# Patient Record
Sex: Female | Born: 1940 | Race: White | Hispanic: No | Marital: Single | Smoking: Never smoker
Health system: Southern US, Community
[De-identification: ages and names within clinical notes are randomized; demographics above are authoritative.]

## PROBLEM LIST (undated history)

## (undated) DIAGNOSIS — I10 Essential (primary) hypertension: Secondary | ICD-10-CM

## (undated) DIAGNOSIS — I509 Heart failure, unspecified: Secondary | ICD-10-CM

## (undated) DIAGNOSIS — F419 Anxiety disorder, unspecified: Secondary | ICD-10-CM

## (undated) DIAGNOSIS — R9430 Abnormal result of cardiovascular function study, unspecified: Secondary | ICD-10-CM

## (undated) DIAGNOSIS — E785 Hyperlipidemia, unspecified: Secondary | ICD-10-CM

## (undated) DIAGNOSIS — J189 Pneumonia, unspecified organism: Secondary | ICD-10-CM

## (undated) DIAGNOSIS — K219 Gastro-esophageal reflux disease without esophagitis: Secondary | ICD-10-CM

## (undated) DIAGNOSIS — J849 Interstitial pulmonary disease, unspecified: Secondary | ICD-10-CM

## (undated) DIAGNOSIS — K589 Irritable bowel syndrome without diarrhea: Secondary | ICD-10-CM

## (undated) DIAGNOSIS — E119 Type 2 diabetes mellitus without complications: Secondary | ICD-10-CM

## (undated) DIAGNOSIS — I639 Cerebral infarction, unspecified: Secondary | ICD-10-CM

## (undated) DIAGNOSIS — C719 Malignant neoplasm of brain, unspecified: Secondary | ICD-10-CM

## (undated) DIAGNOSIS — C50411 Malignant neoplasm of upper-outer quadrant of right female breast: Secondary | ICD-10-CM

## (undated) DIAGNOSIS — M1990 Unspecified osteoarthritis, unspecified site: Secondary | ICD-10-CM

## (undated) DIAGNOSIS — D333 Benign neoplasm of cranial nerves: Secondary | ICD-10-CM

## (undated) DIAGNOSIS — H9190 Unspecified hearing loss, unspecified ear: Secondary | ICD-10-CM

## (undated) DIAGNOSIS — IMO0002 Reserved for concepts with insufficient information to code with codable children: Secondary | ICD-10-CM

## (undated) DIAGNOSIS — K5790 Diverticulosis of intestine, part unspecified, without perforation or abscess without bleeding: Secondary | ICD-10-CM

## (undated) DIAGNOSIS — G250 Essential tremor: Secondary | ICD-10-CM

## (undated) HISTORY — PX: BIOPSY BREAST: PRO8

## (undated) HISTORY — DX: Type 2 diabetes mellitus without complications: E11.9

## (undated) HISTORY — DX: Unspecified osteoarthritis, unspecified site: M19.90

## (undated) HISTORY — DX: Gastro-esophageal reflux disease without esophagitis: K21.9

## (undated) HISTORY — DX: Reserved for concepts with insufficient information to code with codable children: IMO0002

## (undated) HISTORY — DX: Benign neoplasm of cranial nerves: D33.3

## (undated) HISTORY — DX: Malignant neoplasm of upper-outer quadrant of right female breast: C50.411

## (undated) HISTORY — DX: Abnormal result of cardiovascular function study, unspecified: R94.30

## (undated) HISTORY — DX: Hyperlipidemia, unspecified: E78.5

## (undated) HISTORY — DX: Unspecified hearing loss, unspecified ear: H91.90

## (undated) HISTORY — DX: Irritable bowel syndrome, unspecified: K58.9

## (undated) HISTORY — DX: Malignant neoplasm of brain, unspecified: C71.9

## (undated) HISTORY — DX: Diverticulosis of intestine, part unspecified, without perforation or abscess without bleeding: K57.90

## (undated) HISTORY — DX: Essential (primary) hypertension: I10

---
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
## 1978-05-27 HISTORY — PX: ABDOMINAL HYSTERECTOMY: SHX81

## 2004-03-27 ENCOUNTER — Ambulatory Visit: Payer: Self-pay | Admitting: Pain Medicine

## 2004-04-09 ENCOUNTER — Ambulatory Visit: Payer: Self-pay | Admitting: Pain Medicine

## 2004-05-17 ENCOUNTER — Ambulatory Visit: Payer: Self-pay | Admitting: Pain Medicine

## 2004-06-06 ENCOUNTER — Ambulatory Visit: Payer: Self-pay | Admitting: Pain Medicine

## 2004-07-03 ENCOUNTER — Ambulatory Visit: Payer: Self-pay | Admitting: Unknown Physician Specialty

## 2004-07-12 ENCOUNTER — Ambulatory Visit: Payer: Self-pay | Admitting: Pain Medicine

## 2004-08-20 ENCOUNTER — Ambulatory Visit: Payer: Self-pay | Admitting: Pain Medicine

## 2005-03-07 ENCOUNTER — Ambulatory Visit: Payer: Self-pay | Admitting: Family Medicine

## 2005-05-27 HISTORY — PX: OTHER SURGICAL HISTORY: SHX169

## 2005-07-10 ENCOUNTER — Ambulatory Visit: Payer: Self-pay | Admitting: Unknown Physician Specialty

## 2006-03-04 ENCOUNTER — Ambulatory Visit: Payer: Self-pay | Admitting: Ophthalmology

## 2006-03-11 ENCOUNTER — Ambulatory Visit: Payer: Self-pay | Admitting: Ophthalmology

## 2006-03-18 ENCOUNTER — Ambulatory Visit: Payer: Self-pay | Admitting: Family Medicine

## 2006-05-06 ENCOUNTER — Ambulatory Visit: Payer: Self-pay | Admitting: Ophthalmology

## 2006-05-13 ENCOUNTER — Ambulatory Visit: Payer: Self-pay | Admitting: Ophthalmology

## 2006-07-28 ENCOUNTER — Ambulatory Visit: Payer: Self-pay | Admitting: Unknown Physician Specialty

## 2006-09-19 ENCOUNTER — Ambulatory Visit: Payer: Self-pay

## 2007-03-23 ENCOUNTER — Ambulatory Visit: Payer: Self-pay | Admitting: Family Medicine

## 2007-07-31 ENCOUNTER — Ambulatory Visit: Payer: Self-pay | Admitting: Unknown Physician Specialty

## 2008-03-29 ENCOUNTER — Ambulatory Visit: Payer: Self-pay | Admitting: Family Medicine

## 2008-04-05 ENCOUNTER — Ambulatory Visit (HOSPITAL_COMMUNITY): Admission: RE | Admit: 2008-04-05 | Discharge: 2008-04-05 | Payer: Self-pay | Admitting: Unknown Physician Specialty

## 2008-08-03 ENCOUNTER — Ambulatory Visit: Payer: Self-pay | Admitting: Unknown Physician Specialty

## 2009-05-04 ENCOUNTER — Ambulatory Visit: Payer: Self-pay | Admitting: Family Medicine

## 2009-05-25 ENCOUNTER — Encounter: Admission: RE | Admit: 2009-05-25 | Discharge: 2009-05-25 | Payer: Self-pay | Admitting: Family Medicine

## 2009-05-25 ENCOUNTER — Encounter: Payer: Self-pay | Admitting: Family Medicine

## 2009-05-27 HISTORY — PX: CHOLECYSTECTOMY: SHX55

## 2009-05-27 LAB — HM MAMMOGRAPHY: HM Mammogram: NORMAL

## 2009-06-19 ENCOUNTER — Encounter: Admission: RE | Admit: 2009-06-19 | Discharge: 2009-07-12 | Payer: Self-pay | Admitting: Neurosurgery

## 2009-08-09 ENCOUNTER — Ambulatory Visit: Payer: Self-pay | Admitting: Unknown Physician Specialty

## 2009-10-05 ENCOUNTER — Encounter: Admission: RE | Admit: 2009-10-05 | Discharge: 2009-10-05 | Payer: Self-pay | Admitting: Gastroenterology

## 2009-11-08 ENCOUNTER — Encounter (INDEPENDENT_AMBULATORY_CARE_PROVIDER_SITE_OTHER): Payer: Self-pay | Admitting: General Surgery

## 2009-11-09 ENCOUNTER — Inpatient Hospital Stay (HOSPITAL_COMMUNITY): Admission: RE | Admit: 2009-11-09 | Discharge: 2009-11-10 | Payer: Self-pay | Admitting: General Surgery

## 2010-02-21 ENCOUNTER — Encounter: Payer: Self-pay | Admitting: Family Medicine

## 2010-02-27 ENCOUNTER — Encounter
Admission: RE | Admit: 2010-02-27 | Discharge: 2010-03-29 | Payer: Self-pay | Source: Home / Self Care | Admitting: Family Medicine

## 2010-04-03 ENCOUNTER — Ambulatory Visit: Payer: Self-pay | Admitting: Family Medicine

## 2010-04-03 DIAGNOSIS — E785 Hyperlipidemia, unspecified: Secondary | ICD-10-CM | POA: Insufficient documentation

## 2010-04-03 DIAGNOSIS — K219 Gastro-esophageal reflux disease without esophagitis: Secondary | ICD-10-CM | POA: Insufficient documentation

## 2010-04-03 DIAGNOSIS — D333 Benign neoplasm of cranial nerves: Secondary | ICD-10-CM | POA: Insufficient documentation

## 2010-04-03 DIAGNOSIS — Z8719 Personal history of other diseases of the digestive system: Secondary | ICD-10-CM | POA: Insufficient documentation

## 2010-04-03 DIAGNOSIS — I10 Essential (primary) hypertension: Secondary | ICD-10-CM | POA: Insufficient documentation

## 2010-04-03 DIAGNOSIS — K589 Irritable bowel syndrome without diarrhea: Secondary | ICD-10-CM | POA: Insufficient documentation

## 2010-04-03 DIAGNOSIS — Z8672 Personal history of thrombophlebitis: Secondary | ICD-10-CM | POA: Insufficient documentation

## 2010-05-25 ENCOUNTER — Encounter: Payer: Self-pay | Admitting: Family Medicine

## 2010-06-17 ENCOUNTER — Encounter: Payer: Self-pay | Admitting: General Surgery

## 2010-06-27 NOTE — Letter (Signed)
Summary: Records from [facility]  Records from [facility]   Imported By: Maryln Gottron 04/11/2010 14:24:05  _____________________________________________________________________  External Attachment:    Type:   Image     Comment:   External Document

## 2010-06-27 NOTE — Assessment & Plan Note (Signed)
Summary: NEW TO EST/CCM   Vital Signs:  Patient profile:   70 year old female Menstrual status:  postmenopausal LMP:     03/28/1979 Height:      60.50 inches Weight:      154 pounds BMI:     29.69 Temp:     97.6 degrees F oral Pulse rate:   72 / minute Pulse rhythm:   regular Resp:     12 per minute BP sitting:   142 / 82  (left arm) Cuff size:   regular  Vitals Entered By: Sid Falcon LPN (April 03, 2010 9:05 AM)  Nutrition Counseling: Patient's BMI is greater than 25 and therefore counseled on weight management options. LMP (date): 03/28/1979     Menstrual Status postmenopausal Enter LMP: 03/28/1979 Last PAP Result normal   History of Present Illness: New patient to establish care.  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  Compliant with all meds.  No reported side effects.  Patient relates cholecystectomy in June of this year. Prior breast biopsy. Tonsillectomy in childhood. Partial hysterectomy 1980 secondary to menorrhagia.  Medications reviewed. Allergy to sulfa.  Family history significant for father coronary artery disease in his 21s and also prior history of stroke and hyperlipidemia. Mother had hypertensive cardiac disease in her 79s  Patient is widowed since 2007. Nonsmoker. Occasional alcohol use.  Flu vaccine already received. Last tetanus unknown. Old records pending. She's had previous Pneumovax. Colonoscopy 2009.  Preventive Screening-Counseling & Management  Alcohol-Tobacco     Smoking Status: never  Caffeine-Diet-Exercise     Does Patient Exercise: no  Allergies (verified): 1)  Sulfa 2)  Adhesive Tape  Past History:  Family History: Last updated: 04/03/2010 Family History of  Arthritis Father, elevated cholesterol, heart disease, stroke Mother, elevated cholesterol, Htn  Social History: Last updated: 04/03/2010 Occupation: Never Smoked Alcohol use-yes Regular exercise-no Widow/Widower since 2007  Risk Factors: Exercise: no (04/03/2010)  Risk Factors: Smoking Status: never (04/03/2010)  Past Medical History: Arthritis, degenerative Asthma Diverticulosis Hyperlipidemia Hypertension DVT hx following injury Essential Tremor GERD IBS Acoustic Neuroma, left  Past Surgical History: Cholecystectomy 2011 Hysterectomy 1980 Partial sec to menorrhagia Tonsillectomy 1945 Breast biopsy L Acoustic Neuroma 1991 eyes 2007 PMH-FH-SH reviewed for relevance  Family History: Family History of Arthritis Father, elevated cholesterol, heart disease, stroke Mother, elevated cholesterol, Htn  Social History: Occupation: Never Smoked Alcohol use-yes Regular exercise-no Widow/Widower since 2007 Smoking Status:  never Occupation:  employed Does Patient Exercise:  no  Review of Systems  The patient denies anorexia, fever, weight loss, weight gain, vision loss, decreased hearing, hoarseness, chest pain, syncope, dyspnea on exertion, peripheral edema, prolonged cough, headaches, hemoptysis, abdominal pain, melena, hematochezia, severe indigestion/heartburn, hematuria, incontinence, genital sores, muscle weakness, suspicious skin lesions, transient blindness, difficulty walking, depression, unusual weight change, abnormal bleeding, enlarged lymph nodes, and breast masses.    Physical Exam  General:  Well-developed,well-nourished,in no acute distress; alert,appropriate and cooperative throughout examination Head:  Normocephalic and atraumatic without obvious abnormalities. No apparent alopecia or balding. Eyes:  pupils equal, pupils round, and pupils reactive to light.   Ears:  External ear exam shows no significant lesions or deformities.  Otoscopic  examination reveals clear canals, tympanic membranes are intact bilaterally without bulging, retraction, inflammation or discharge. Hearing is grossly normal bilaterally. Mouth:  Oral  mucosa and oropharynx without lesions or exudates.  Teeth in good repair. Neck:  No deformities, masses, or tenderness noted. Lungs:  Normal respiratory effort, chest expands symmetrically. Lungs are clear to auscultation, no crackles or wheezes. Heart:  normal rate and regular rhythm.   Abdomen:  soft and non-tender.   Extremities:  No clubbing, cyanosis, edema, or deformity noted with normal full range of motion of all joints.   Neurologic:  alert & oriented X3 and cranial nerves II-XII intact.   Cervical Nodes:  No lymphadenopathy noted Psych:  normally interactive, good eye contact, not anxious appearing, and not depressed appearing.     Impression & Recommendations:  Problem # 1:  HYPERTENSION (ICD-401.9)  Her updated medication list for this problem includes:    Lisinopril 20 Mg Tabs (Lisinopril) ..... One by mouth two times a day    Propranolol Hcl 40 Mg Tabs (Propranolol hcl) ..... One by mouth two times a day  Problem # 2:  HYPERLIPIDEMIA (ICD-272.4)  Problem # 3:  TREMOR, ESSENTIAL (ICD-333.1)  Problem # 4:  DEEP VENOUS THROMBOPHLEBITIS, HX OF (ICD-V12.52)  Her updated medication list for this problem includes:    Aspirin 81 Mg Tabs (Aspirin) ..... Once daily  Problem # 5:  GERD (ICD-530.81)  Her updated medication list for this problem includes:    Omeprazole 20 Mg Cpdr (Omeprazole) ..... Once daily    Clidinium-chlordiazepoxide 2.5-5 Mg Caps (Clidinium-chlordiazepoxide) ..... One by mouth two times a day as needed  Problem # 6:  ACOUSTIC NEUROMA (ICD-225.1) followed by ENT.  Problem # 7:  IBS (ICD-564.1)  Complete Medication List: 1)  Lisinopril 20 Mg Tabs (Lisinopril) .... One by mouth two times a day 2)  Aspirin 81 Mg Tabs (Aspirin) .... Once daily 3)  Omeprazole 20 Mg Cpdr  (Omeprazole) .... Once daily 4)  Propranolol Hcl 40 Mg Tabs (Propranolol hcl) .... One by mouth two times a day 5)  Clidinium-chlordiazepoxide 2.5-5 Mg Caps (Clidinium-chlordiazepoxide) .... One by mouth two times a day as needed  Patient Instructions: 1)  Please schedule a follow-up appointment in 6 months .  2)  Schedule CPE at follow up.   Orders Added: 1)  New Patient Level IV [11914]    Preventive Care Screening  Last Flu Shot:    Date:  03/27/2010    Results:  given   Pap Smear:    Date:  05/27/2009    Results:  normal   Mammogram:    Date:  05/27/2008    Results:  normal   Colonoscopy:    Date:  03/27/2008    Results:  abnormal

## 2010-07-02 ENCOUNTER — Telehealth: Payer: Self-pay | Admitting: Family Medicine

## 2010-07-02 ENCOUNTER — Encounter: Payer: Self-pay | Admitting: Family Medicine

## 2010-07-02 ENCOUNTER — Ambulatory Visit (INDEPENDENT_AMBULATORY_CARE_PROVIDER_SITE_OTHER): Payer: Medicare Other | Admitting: Family Medicine

## 2010-07-02 VITALS — BP 150/84 | Temp 97.8°F | Ht 61.0 in | Wt 153.0 lb

## 2010-07-02 DIAGNOSIS — J0110 Acute frontal sinusitis, unspecified: Secondary | ICD-10-CM

## 2010-07-02 MED ORDER — AZITHROMYCIN 250 MG PO TABS: ORAL_TABLET | ORAL | Status: AC

## 2010-07-02 NOTE — Telephone Encounter (Signed)
Pt informed Rx was called in, printed by mistake, error with Epic

## 2010-07-02 NOTE — Progress Notes (Signed)
  Subjective:    Patient ID: Carmen Duncan, female    DOB: 11-Mar-1941, 70 y.o.   MRN: [id number]  HPI Patient seen with onset of illness about one week ago. Postnasal drainage, nasal congestion, sore throat, cough which has recently become productive and frontal sinus pressure- Especially over right frontal sinus region. Denies any fever or chills. Intermittent laryngitis symptoms. Mild headaches. Tylenol and antihistamine without relief.  No teeth pain.  Very little relief with OTC analgesics. Has also taken some Mucinex.   Review of Systems    Denies chronic cough, fever, dyspnea, rash, nausea, or vomiting. Objective:   Physical Exam    patient alert in no distress Thick yellow mucous right naris nasal mucosa erythematous bilaterally Tender over frontal sinuses bilaterally. Neck supple no adenopathy Oropharynx clear with moist mucosa  Chest clear to auscultation Heart regular rhythm and rate Skin no rash     Assessment & Plan: Acute sinusitis.  acute sinusitis. Acute sinusitis  Acute sinusitis.  Start Zithromax and plenty of fluids and f/u prn.

## 2010-07-02 NOTE — Telephone Encounter (Signed)
Pt went to [facility] on [address] to pick up zpak and nothing has been called in. Pt is waiting at the pharmacy. Pls call in to pharmacy asap ph [phone]  Fax [phone]

## 2010-07-25 ENCOUNTER — Other Ambulatory Visit: Payer: Self-pay | Admitting: Family Medicine

## 2010-07-25 MED ORDER — CILIDINIUM-CHLORDIAZEPOXIDE 2.5-5 MG PO CAPS: 2.0000 | ORAL_CAPSULE | Freq: Two times a day (BID) | ORAL | Status: DC

## 2010-07-25 NOTE — Telephone Encounter (Signed)
Rx called into pharmacy and sent electronically

## 2010-07-25 NOTE — Telephone Encounter (Signed)
[facility] phar call waiting on response from doc

## 2010-07-25 NOTE — Telephone Encounter (Signed)
Pt called and said that [facility] on [facility], has sent numerous req over to get refill of pts generic Librax 2.5 mg. Pt is leaving to go out of town today and will not be back until Saturday. Pt is at pharmacy. Pls call in to [facility] Phone # [phone]  Or fax # [phone]

## 2010-07-31 ENCOUNTER — Other Ambulatory Visit: Payer: Self-pay | Admitting: *Deleted

## 2010-07-31 DIAGNOSIS — I10 Essential (primary) hypertension: Secondary | ICD-10-CM

## 2010-07-31 MED ORDER — LISINOPRIL 20 MG PO TABS: 20.0000 mg | ORAL_TABLET | Freq: Two times a day (BID) | ORAL | Status: DC

## 2010-08-12 LAB — COMPREHENSIVE METABOLIC PANEL
AST: 52 U/L — ABNORMAL HIGH (ref 0–37)
BUN: 10 mg/dL (ref 6–23)
Creatinine, Ser: 0.72 mg/dL (ref 0.4–1.2)
GFR calc non Af Amer: >60 mL/min (ref >60)
Sodium: 140 mEq/L (ref 135–145)
Total Bilirubin: 1 mg/dL (ref 0.3–1.2)
Total Protein: 6.2 g/dL (ref 6.0–8.3)

## 2010-08-12 LAB — CBC
HCT: 39 % (ref 36.0–46.0)
Hemoglobin: 13.5 g/dL (ref 12.0–15.0)
MCHC: 34.7 g/dL (ref 30.0–36.0)
MCV: 93.4 fL (ref 78.0–100.0)
RDW: 13.7 % (ref 11.5–15.5)
WBC: 9.2 10*3/uL (ref 4.0–10.5)

## 2010-08-13 LAB — COMPREHENSIVE METABOLIC PANEL
ALT: 25 U/L (ref 0–35)
AST: 30 U/L (ref 0–37)
Albumin: 3.5 g/dL (ref 3.5–5.2)
BUN: 12 mg/dL (ref 6–23)
CO2: 29 mEq/L (ref 19–32)
Calcium: 9.4 mg/dL (ref 8.4–10.5)
Creatinine, Ser: 0.76 mg/dL (ref 0.4–1.2)
GFR calc non Af Amer: >60 mL/min (ref >60)
Glucose, Bld: 103 mg/dL — ABNORMAL HIGH (ref 70–99)
Sodium: 142 mEq/L (ref 135–145)
Total Protein: 6.3 g/dL (ref 6.0–8.3)

## 2010-08-13 LAB — DIFFERENTIAL
Basophils Absolute: 0 10*3/uL (ref 0.0–0.1)
Lymphocytes Relative: 34 % (ref 12–46)
Monocytes Absolute: 0.6 10*3/uL (ref 0.1–1.0)
Neutrophils Relative %: 52 % (ref 43–77)

## 2010-08-13 LAB — CBC
Platelets: 256 10*3/uL (ref 150–400)
RDW: 13.3 % (ref 11.5–15.5)
WBC: 5.4 10*3/uL (ref 4.0–10.5)

## 2010-10-02 ENCOUNTER — Encounter: Payer: Self-pay | Admitting: Family Medicine

## 2010-10-02 ENCOUNTER — Ambulatory Visit (INDEPENDENT_AMBULATORY_CARE_PROVIDER_SITE_OTHER): Payer: Medicare Other | Admitting: Family Medicine

## 2010-10-02 DIAGNOSIS — R109 Unspecified abdominal pain: Secondary | ICD-10-CM

## 2010-10-02 DIAGNOSIS — I10 Essential (primary) hypertension: Secondary | ICD-10-CM

## 2010-10-02 DIAGNOSIS — R10A0 Flank pain, unspecified side: Secondary | ICD-10-CM

## 2010-10-02 DIAGNOSIS — K589 Irritable bowel syndrome without diarrhea: Secondary | ICD-10-CM

## 2010-10-02 DIAGNOSIS — Z299 Encounter for prophylactic measures, unspecified: Secondary | ICD-10-CM

## 2010-10-02 DIAGNOSIS — Z0000 Encounter for general adult medical examination without abnormal findings: Secondary | ICD-10-CM

## 2010-10-02 DIAGNOSIS — E785 Hyperlipidemia, unspecified: Secondary | ICD-10-CM

## 2010-10-02 DIAGNOSIS — R635 Abnormal weight gain: Secondary | ICD-10-CM

## 2010-10-02 LAB — POCT URINALYSIS DIPSTICK
Bilirubin, UA: NEGATIVE
Blood, UA: NEGATIVE
Leukocytes, UA: NEGATIVE
Nitrite, UA: NEGATIVE
Protein, UA: NEGATIVE
pH, UA: 6

## 2010-10-02 LAB — BASIC METABOLIC PANEL
Calcium: 9.7 mg/dL (ref 8.4–10.5)
Chloride: 110 mEq/L (ref 96–112)
Creatinine, Ser: 0.8 mg/dL (ref 0.4–1.2)
GFR: 79.46 mL/min (ref >60.00)
Glucose, Bld: 87 mg/dL (ref 70–99)

## 2010-10-02 LAB — LIPID PANEL
HDL: 53 mg/dL (ref >39.00)
Triglycerides: 149 mg/dL (ref 0.0–149.0)
VLDL: 29.8 mg/dL (ref 0.0–40.0)

## 2010-10-02 LAB — LDL CHOLESTEROL, DIRECT: Direct LDL: 194.6 mg/dL

## 2010-10-02 MED ORDER — LISINOPRIL 20 MG PO TABS: 20.0000 mg | ORAL_TABLET | Freq: Two times a day (BID) | ORAL | Status: DC

## 2010-10-02 MED ORDER — TETANUS-DIPHTH-ACELL PERTUSSIS 5-2.5-18.5 LF-MCG/0.5 IM SUSP: 0.5000 mL | Freq: Once | INTRAMUSCULAR | Status: DC

## 2010-10-02 MED ORDER — PROPRANOLOL HCL 40 MG PO TABS: 40.0000 mg | ORAL_TABLET | Freq: Two times a day (BID) | ORAL | Status: DC

## 2010-10-02 MED ORDER — CILIDINIUM-CHLORDIAZEPOXIDE 2.5-5 MG PO CAPS: 2.0000 | ORAL_CAPSULE | Freq: Two times a day (BID) | ORAL | Status: DC

## 2010-10-02 NOTE — Progress Notes (Signed)
Subjective:    Patient ID: Carmen Duncan, female    DOB: 09-20-1940, 70 y.o.   MRN: [id number]  HPI Patient is here for Medicare wellness exam and followup medical problems and some acute issues. She has history of acoustic neuroma, hyperlipidemia, essential tremor, hypertension, IBS, GERD, and history of diverticulosis. She has had some weight gain recent years. Not exercising regularly. Poor dietary compliance at times. Increased fatigue. Poor sleep quality at night. Intermittent left flank and lower abdominal discomfort. No fever or chills. Some chronic intermittent constipation. She had colonoscopy 2009.  Reported diverticulosis but no history or diverticulitis.  last tetanus unknown. No history of shingles vaccine. Prior Pneumovax. Mammogram last December. Sees gynecologist in 2 weeks. Apparently still getting Pap smears though she's had prior hysterectomy.  IBS for years, constipation predominant.  She takes stool softeners prn.  As uses Librax prn.  Hypertension stable.  Compliant with all meds and denies any side effects.  Her flank pain above is relatively mild and intermittent.  No fever or chills.  No exacerbating or alleviating factors.  No urinary symptoms.  1.  Risk factors based on Past Medical , Social, and Family history  PMH as above.  She has hx of IBS with some ongoing constipation issues. 2.  Limitations in physical activities  no limitation in activities. No major risk factor for fall 3.  Depression/mood  no recent depression or anxiety issues 4.  Hearing  no major hearing deficits 5.  ADLs  fully independent in all ADLs 6.  Cognitive function (orientation to time and place, language, writing, speech,memory)  no short or long-term memory impairment. No language impairment. No impairment in judgment. 7.  Home Safety no safety issues identified 8.  Height, weight, and visual acuity. Patient's weight has increased gradually in recent years. Height stable. No visual problems 9.   Counseling  We discussed rec for shingles vaccine and need for more exercise and weight loss. 10. Recommendation of preventive services.  Zostavax and continue with yearly mammograms. 11. Labs based on risk factors lipid panel, TSH, and basic metabolic panel 12. Care Plan  As above.  Shingles vaccine is given after full discussion of risks and benefits. 13.  Advanced Directives-discussed with pt.  Past Medical History  Diagnosis Date  . Arthritis   . Asthma   . Hypertension   . GERD (gastroesophageal reflux disease)   . Hyperlipidemia   . Diverticulosis   . IBS (irritable bowel syndrome)    Past Surgical History  Procedure Date  . Cholecystectomy 2011  . Abdominal hysterectomy 1980  . Tonsillectomy 1945  . Biopsy breast   . Eyes 2007    reports that she has never smoked. She does not have any smokeless tobacco history on file. Her alcohol and drug histories not on file. family history includes Heart disease (age of onset:50) in her father; Hyperlipidemia in her father and mother; Hypertension in her mother; and Stroke in her father. Allergies  Allergen Reactions  . Latex     Red angry skin from latex tape  . Sulfonamide Derivatives Rash       Review of Systems  Constitutional: Positive for fatigue and unexpected weight change. Negative for fever, chills, activity change and appetite change.  Eyes: Negative for visual disturbance.  Respiratory: Negative for cough, shortness of breath and wheezing.   Cardiovascular: Negative for chest pain, palpitations and leg swelling.  Gastrointestinal: Positive for abdominal pain and constipation. Negative for nausea, vomiting, diarrhea, abdominal distention, anal bleeding and  rectal pain.  Genitourinary: Positive for urgency. Negative for dysuria.  Musculoskeletal: Negative for gait problem.  Skin: Negative for rash.  Neurological: Negative for dizziness, syncope and headaches.  Hematological: Negative for adenopathy.    Psychiatric/Behavioral: Negative for dysphoric mood.       Objective:   Physical Exam  Constitutional: She is oriented to person, place, and time. She appears well-developed and well-nourished. No distress.  HENT:  Right Ear: External ear normal.  Left Ear: External ear normal.  Mouth/Throat: Oropharynx is clear and moist. No oropharyngeal exudate.  Eyes: Pupils are equal, round, and reactive to light.  Neck: Neck supple. No thyromegaly present.  Cardiovascular: Normal rate, regular rhythm and normal heart sounds.   Pulmonary/Chest: Effort normal and breath sounds normal. No respiratory distress. She has no wheezes. She has no rales.       Breast deferred as she will see gyn in 2 weeks.  Abdominal: Soft. Bowel sounds are normal. She exhibits no distension and no mass. There is no tenderness. There is no rebound and no guarding.  Musculoskeletal: She exhibits no edema.  Lymphadenopathy:    She has no cervical adenopathy.  Neurological: She is alert and oriented to person, place, and time. No cranial nerve deficit.  Skin: No rash noted.  Psychiatric: She has a normal mood and affect.          Assessment & Plan:  #1 complete physical. Wellness issues addressed. Needs tetanus booster. Check on shingles coverage. Check baseline labs. Recommend more consistent exercise and work on weight loss. Check urine dipstick. Colonoscopy up-to-date. Continue yearly mammograms #2 hypertension with somewhat elevated reading today but did not take medication today. Refilled medications for one year #3 history of GERD stable on Prilosec. Continue current medications #4 history of essential tremor. Refilled meds for one year

## 2010-10-03 ENCOUNTER — Telehealth: Payer: Self-pay | Admitting: *Deleted

## 2010-10-03 MED ORDER — SIMVASTATIN 20 MG PO TABS: 20.0000 mg | ORAL_TABLET | Freq: Every evening | ORAL | Status: DC

## 2010-10-03 NOTE — Progress Notes (Signed)
Quick Note:  Pt informed and she has tried a statin before, does not remember. She did say she would not take Lipitor due to what she has read/heard. Pt will try Simvastatin and return in 6-8 weeks for repeat labs FYI ______

## 2010-10-03 NOTE — Telephone Encounter (Signed)
Pt informed of new med

## 2010-10-08 ENCOUNTER — Telehealth: Payer: Self-pay | Admitting: Family Medicine

## 2010-10-08 NOTE — Telephone Encounter (Signed)
On generic Zocor. She knows for sure that it is causing nausea. She did not take it today, and the nausea is gone. Wants to try something else. Please call in a new rx to [facility]

## 2010-10-08 NOTE — Telephone Encounter (Signed)
Noted.  Let's try Crestor 10 mg po qd and repeat lipids and hepatic in 6-8 weeks.

## 2010-10-08 NOTE — Telephone Encounter (Signed)
Please advise 

## 2010-10-09 MED ORDER — ROSUVASTATIN CALCIUM 10 MG PO TABS: 10.0000 mg | ORAL_TABLET | Freq: Every day | ORAL | Status: DC

## 2010-10-09 NOTE — Telephone Encounter (Signed)
Pt informed on home VM of new med and need for return fasting labs in 6-8 weeks

## 2010-10-16 ENCOUNTER — Ambulatory Visit: Payer: Self-pay | Admitting: Unknown Physician Specialty

## 2010-11-07 DIAGNOSIS — Z23 Encounter for immunization: Secondary | ICD-10-CM

## 2010-11-13 ENCOUNTER — Encounter: Payer: Medicare Other | Admitting: Vascular Surgery

## 2010-11-14 ENCOUNTER — Telehealth: Payer: Self-pay | Admitting: *Deleted

## 2010-11-14 MED ORDER — CILIDINIUM-CHLORDIAZEPOXIDE 2.5-5 MG PO CAPS: ORAL_CAPSULE | ORAL | Status: DC

## 2010-11-14 NOTE — Telephone Encounter (Signed)
Attempt tp call [facility] - "pharmacy closed for lunch " correctin made to sid on med and efiled new rx to [facility]. Should be prn directions

## 2010-11-14 NOTE — Telephone Encounter (Signed)
Needs clarification on directions for generic librax.

## 2010-11-21 ENCOUNTER — Telehealth: Payer: Self-pay | Admitting: *Deleted

## 2010-11-21 NOTE — Telephone Encounter (Signed)
Pt states she noticed the color of her propranolol pill was not the same color when she got it refilled.  She called the pharmacy and was advised there was a change made to her propranolol rx.  Pt states she was not aware of any change being made and needs this to be confirmed.   Patient states something is not right she has been feeling tired and fatigued for the last couple of weeks and think that it may be related to her medication.  Pt states she did begin taking crestor a few weeks ago.

## 2010-11-21 NOTE — Telephone Encounter (Signed)
Change of color of tablet could reflect different generic manufacturer.  Our med list states she should be on Propranolol 40 mg twice daily.  Confirm her dose.

## 2010-11-22 NOTE — Telephone Encounter (Signed)
Carmen Duncan please confirm with patient

## 2010-11-23 NOTE — Telephone Encounter (Signed)
I spoke with pt, she was on 80mg  per Julieanne Manson MD.  That was a good year ago.  Her bottle does say 40 mg, take one tab twice daily, this was also confirmed with pharmacist.

## 2010-12-07 ENCOUNTER — Other Ambulatory Visit (INDEPENDENT_AMBULATORY_CARE_PROVIDER_SITE_OTHER): Payer: Medicare Other

## 2010-12-07 DIAGNOSIS — E785 Hyperlipidemia, unspecified: Secondary | ICD-10-CM

## 2010-12-07 LAB — HEPATIC FUNCTION PANEL
Albumin: 4.1 g/dL (ref 3.5–5.2)
Alkaline Phosphatase: 65 U/L (ref 39–117)
Total Bilirubin: 0.5 mg/dL (ref 0.3–1.2)

## 2010-12-07 LAB — LIPID PANEL
HDL: 50.8 mg/dL (ref >39.00)
LDL Cholesterol: 63 mg/dL (ref 0–99)
Total CHOL/HDL Ratio: 3
Triglycerides: 146 mg/dL (ref 0.0–149.0)

## 2010-12-10 NOTE — Progress Notes (Signed)
Quick Note:  Pt informed ______ 

## 2011-01-01 ENCOUNTER — Other Ambulatory Visit: Payer: Self-pay | Admitting: Family Medicine

## 2011-01-01 DIAGNOSIS — I10 Essential (primary) hypertension: Secondary | ICD-10-CM

## 2011-01-01 NOTE — Telephone Encounter (Signed)
Needs new rx for Lisinopril and Propanolol 14  day supply to [facility] [address], until her mail order arrives. She stated that she only has 1 pill left.  [facility] will send a fax refill of the 2 listed above and crestor.

## 2011-01-02 ENCOUNTER — Other Ambulatory Visit: Payer: Self-pay | Admitting: *Deleted

## 2011-01-02 DIAGNOSIS — I10 Essential (primary) hypertension: Secondary | ICD-10-CM

## 2011-01-02 MED ORDER — ROSUVASTATIN CALCIUM 10 MG PO TABS: 10.0000 mg | ORAL_TABLET | Freq: Every day | ORAL | Status: DC

## 2011-01-02 MED ORDER — LISINOPRIL 20 MG PO TABS: 20.0000 mg | ORAL_TABLET | Freq: Two times a day (BID) | ORAL | Status: DC

## 2011-01-02 MED ORDER — PROPRANOLOL HCL 40 MG PO TABS: 40.0000 mg | ORAL_TABLET | Freq: Two times a day (BID) | ORAL | Status: DC

## 2011-01-02 NOTE — Telephone Encounter (Signed)
Pt called again and wanted only the Propanolol #14 sent to [facility].[address].

## 2011-01-02 NOTE — Telephone Encounter (Signed)
rx efiled to [facility]

## 2011-02-26 LAB — CREATININE, SERUM: GFR calc Af Amer: >60

## 2011-02-26 LAB — BUN: BUN: 16

## 2011-03-08 ENCOUNTER — Other Ambulatory Visit: Payer: Self-pay | Admitting: Family Medicine

## 2011-03-08 DIAGNOSIS — I10 Essential (primary) hypertension: Secondary | ICD-10-CM

## 2011-03-08 MED ORDER — LISINOPRIL 20 MG PO TABS: 20.0000 mg | ORAL_TABLET | Freq: Two times a day (BID) | ORAL | Status: DC

## 2011-03-08 NOTE — Telephone Encounter (Signed)
Pt need new rx lisinopril 20 mg twice a day #180mg  with 3 refills  fax to [facility] [phone].

## 2011-04-04 LAB — HM PAP SMEAR: HM Pap smear: NEGATIVE

## 2011-04-15 ENCOUNTER — Encounter: Payer: Self-pay | Admitting: Vascular Surgery

## 2011-05-04 LAB — HM MAMMOGRAPHY: HM Mammogram: NEGATIVE

## 2011-05-13 ENCOUNTER — Encounter: Payer: Self-pay | Admitting: Vascular Surgery

## 2011-05-14 ENCOUNTER — Ambulatory Visit (INDEPENDENT_AMBULATORY_CARE_PROVIDER_SITE_OTHER): Payer: Medicare Other | Admitting: Vascular Surgery

## 2011-05-14 ENCOUNTER — Ambulatory Visit (INDEPENDENT_AMBULATORY_CARE_PROVIDER_SITE_OTHER): Payer: Medicare Other | Admitting: *Deleted

## 2011-05-14 ENCOUNTER — Encounter: Payer: Self-pay | Admitting: Vascular Surgery

## 2011-05-14 VITALS — BP 148/82 | HR 87 | Resp 20 | Ht 61.0 in | Wt 150.0 lb

## 2011-05-14 DIAGNOSIS — Z86718 Personal history of other venous thrombosis and embolism: Secondary | ICD-10-CM

## 2011-05-14 DIAGNOSIS — I83893 Varicose veins of bilateral lower extremities with other complications: Secondary | ICD-10-CM

## 2011-05-14 NOTE — Progress Notes (Signed)
Patient presents with reticular and spider veins that are painful. Wanting eval.

## 2011-05-14 NOTE — Progress Notes (Signed)
Subjective:     Patient ID: Carmen Duncan, female   DOB: 17-Oct-1940, 70 y.o.   MRN: [id number]  HPI this 71 year old healthy female is self-referred for venous problems in both legs right worse than left. She has noted some spider and reticular veins in her right leg particularly below the knee and in the lateral thigh area. One below the knee has caused significant pain recently and is very hyper sensitive. She has a questionable history of "blood clots" in both legs after a skiing accident many years ago which required some blood thinners but she does not know the details. She does not have chronic swelling and has no history of stasis ulcers, bleeding, or recent DVT or thrombophlebitis. She does not wear elastic compression stockings nor elevate her legs.  Past Medical History  Diagnosis Date  . Arthritis   . Asthma   . Hypertension   . GERD (gastroesophageal reflux disease)   . Hyperlipidemia   . Diverticulosis   . IBS (irritable bowel syndrome)     History  Substance Use Topics  . Smoking status: Never Smoker   . Smokeless tobacco: Not on file  . Alcohol Use: Not on file    Family History  Problem Relation Age of Onset  . Hyperlipidemia Mother   . Hypertension Mother   . Hyperlipidemia Father   . Heart disease Father 48  . Stroke Father     Allergies  Allergen Reactions  . Latex     Red angry skin from latex tape  . Zocor (Simvastatin)     nausea  . Sulfonamide Derivatives Rash    Current outpatient prescriptions:aspirin 81 MG tablet, Take 81 mg by mouth daily.  , Disp: , Rfl: ;  clidinium-chlordiazePOXIDE (LIBRAX) 2.5-5 MG per capsule, One tab 2 times a day as needed, Disp: 60 capsule, Rfl: 3;  lisinopril (PRINIVIL,ZESTRIL) 20 MG tablet, Take 1 tablet (20 mg total) by mouth 2 (two) times daily., Disp: 180 tablet, Rfl: 2;  omeprazole (PRILOSEC OTC) 20 MG tablet, Take 20 mg by mouth daily.  , Disp: , Rfl:  Probiotic Product (ALIGN) 4 MG CAPS, Take by mouth daily.  , Disp: ,  Rfl: ;  propranolol (INDERAL) 40 MG tablet, Take 1 tablet (40 mg total) by mouth 2 (two) times daily., Disp: 180 tablet, Rfl: 3;  rosuvastatin (CRESTOR) 10 MG tablet, Take 1 tablet (10 mg total) by mouth at bedtime., Disp: 90 tablet, Rfl: 3 Current facility-administered medications:TDaP (BOOSTRIX) injection 0.5 mL, 0.5 mL, Intramuscular, Once, Bruce W Burchette  BP 148/82  Pulse 87  Resp 20  Ht 5\' 1"  (1.549 m)  Wt 150 lb (68.04 kg)  BMI 28.34 kg/m2  Body mass index is 28.34 kg/(m^2).            Review of Systems denies chest pain, dyspnea on exertion, PND, orthopnea, lateralizing weakness, aphasia, syncope or other symptoms in the complete review of systems    Objective:   Physical Exam blood pressure 140/82 heart rate 87 respirations 20 Gen. well-developed well-nourished female in no apparent distress Lungs no rhonchi or wheezing HEENT normal for age Cardiovascular regular rhythm no murmurs carotid pulses 3+ no bruits Abdomen soft nontender no masses Neurologic exam normal Musculoskeletal 3 major deformities Lower extremity exam reveals 3+ femoral and dorsalis pedis pulses bilateral Skin exam reveals some diffuse spider and reticular veins right leg worse than left in the anterolateral thigh and in the lateral calf area with no distal edema. There is no hyperpigmentation or ulceration.  No bulging varicosities are noted.  Today I ordered a venous duplex exam which I reviewed and interpreted. There is no DVT or significant reflux in the great or small saphenous systems bilaterally     Assessment:     No evidence of significant reflux or significant deep venous insufficiency or superficial insufficiency    Plan:    spider and reticular veins both lower extremities Sclerotherapy recommended if patient desires treatment

## 2011-05-22 NOTE — Procedures (Unsigned)
LOWER EXTREMITY VENOUS REFLUX EXAM  INDICATION:  History of DVT.  EXAM:  Using color-flow imaging and pulse Doppler spectral analysis, the bilateral common femoral, superficial femoral, popliteal, posterior tibial, greater and lesser saphenous veins are evaluated.  There is evidence suggesting deep venous insufficiency in the bilateral lower extremities.  The bilateral saphenofemoral junctions and nontortuous greater saphenous veins demonstrate competency.  The bilateral short saphenous vein demonstrates competency.  GSV Diameter (used if found to be incompetent only)                                           Right    Left Proximal Greater Saphenous Vein           cm       cm Proximal-to-mid-thigh                     cm       cm Mid thigh                                 cm       cm Mid-distal thigh                          cm       cm Distal thigh                              cm       cm Knee                                      cm       cm  IMPRESSION: 1. Bilateral greater and small saphenous veins are competent. 2. Bilateral deep venous system reflux is >500 milliseconds noted, as     described on the attached worksheet.  ___________________________________________ Quita Skye. Hart Rochester, M.D.  CH/MEDQ  D:  05/15/2011  T:  05/15/2011  Job:  [id number]

## 2011-05-30 ENCOUNTER — Encounter: Payer: Self-pay | Admitting: Family Medicine

## 2011-06-22 ENCOUNTER — Other Ambulatory Visit: Payer: Self-pay | Admitting: Family Medicine

## 2011-06-24 ENCOUNTER — Other Ambulatory Visit: Payer: Self-pay | Admitting: *Deleted

## 2011-06-24 MED ORDER — CILIDINIUM-CHLORDIAZEPOXIDE 2.5-5 MG PO CAPS: 1.0000 | ORAL_CAPSULE | Freq: Two times a day (BID) | ORAL | Status: DC | PRN

## 2011-07-04 ENCOUNTER — Ambulatory Visit: Payer: Medicare Other | Admitting: Family Medicine

## 2011-07-05 ENCOUNTER — Encounter: Payer: Self-pay | Admitting: Family Medicine

## 2011-07-05 ENCOUNTER — Ambulatory Visit (INDEPENDENT_AMBULATORY_CARE_PROVIDER_SITE_OTHER): Payer: Medicare Other | Admitting: Family Medicine

## 2011-07-05 DIAGNOSIS — K589 Irritable bowel syndrome without diarrhea: Secondary | ICD-10-CM

## 2011-07-05 DIAGNOSIS — G250 Essential tremor: Secondary | ICD-10-CM

## 2011-07-05 DIAGNOSIS — I10 Essential (primary) hypertension: Secondary | ICD-10-CM

## 2011-07-05 DIAGNOSIS — Z299 Encounter for prophylactic measures, unspecified: Secondary | ICD-10-CM

## 2011-07-05 DIAGNOSIS — G252 Other specified forms of tremor: Secondary | ICD-10-CM

## 2011-07-05 DIAGNOSIS — E785 Hyperlipidemia, unspecified: Secondary | ICD-10-CM

## 2011-07-05 LAB — HEPATIC FUNCTION PANEL
ALT: 17 U/L (ref 0–35)
AST: 22 U/L (ref 0–37)
Albumin: 4 g/dL (ref 3.5–5.2)
Total Protein: 7.1 g/dL (ref 6.0–8.3)

## 2011-07-05 LAB — LIPID PANEL
Cholesterol: 162 mg/dL (ref 0–200)
HDL: 57.6 mg/dL (ref >39.00)
Triglycerides: 161 mg/dL — ABNORMAL HIGH (ref 0.0–149.0)

## 2011-07-05 MED ORDER — PNEUMOCOCCAL VAC POLYVALENT 25 MCG/0.5ML IJ INJ: 0.5000 mL | INJECTION | Freq: Once | INTRAMUSCULAR | Status: DC

## 2011-07-05 NOTE — Patient Instructions (Signed)
Monitor blood pressure regularly next few weeks and be in touch if consistently > 140/90

## 2011-07-05 NOTE — Progress Notes (Signed)
  Subjective:    Patient ID: Carmen Duncan, female    DOB: 1941-03-22, 71 y.o.   MRN: [id number]  HPI  Medical followup. Patient has history of hyperlipidemia, hypertension, GERD, irritable bowel syndrome, and essential tremor. Medications reviewed. Compliant with all. Not monitoring blood pressure. Slightly elevated here recently. She denied headaches or dizziness. No chest pains. Hyperlipidemia treated with Crestor. No myalgias.  Essential tremor treated with propranolol. She takes 40 mg twice daily. This is controlling her symptoms well.  She's had some low back pain for several years. Mostly right lumbar area. Reportedly had MRI scan 2 years ago which showed degenerative changes. She's had physical therapy twice which seemed to help briefly. Not doing any home exercises currently. No radiculopathy symptoms. No weakness. No incontinence symptoms.  Cannot confirm Pneumovax but possibly before age 21.  Past Medical History  Diagnosis Date  . Arthritis   . Asthma   . Hypertension   . GERD (gastroesophageal reflux disease)   . Hyperlipidemia   . Diverticulosis   . IBS (irritable bowel syndrome)    Past Surgical History  Procedure Date  . Cholecystectomy 2011  . Abdominal hysterectomy 1980  . Tonsillectomy 1945  . Biopsy breast   . Eyes 2007    reports that she has never smoked. She does not have any smokeless tobacco history on file. Her alcohol and drug histories not on file. family history includes Heart disease (age of onset:50) in her father; Hyperlipidemia in her father and mother; Hypertension in her mother; and Stroke in her father. Allergies  Allergen Reactions  . Latex     Red angry skin from latex tape  . Zocor (Simvastatin)     nausea  . Sulfonamide Derivatives Rash    hives      Review of Systems  Constitutional: Negative for fatigue.  Eyes: Negative for visual disturbance.  Respiratory: Negative for cough, chest tightness, shortness of breath and wheezing.     Cardiovascular: Negative for chest pain, palpitations and leg swelling.  Musculoskeletal: Positive for back pain.  Neurological: Negative for dizziness, seizures, syncope, weakness, light-headedness and headaches.       Objective:   Physical Exam  Constitutional: She appears well-developed and well-nourished.  HENT:  Mouth/Throat: Oropharynx is clear and moist.  Neck: Neck supple.  Cardiovascular: Normal rate and regular rhythm.   Pulmonary/Chest: Effort normal and breath sounds normal. No respiratory distress. She has no wheezes. She has no rales.  Musculoskeletal: She exhibits no edema.       Tender right lower lumbar area. Patient has negative straight leg raises.  Lymphadenopathy:    She has no cervical adenopathy.  Neurological:       Symmetric reflexes knee and ankle. Full-strength right lower and left lower extremity          Assessment & Plan:  #1 hypertension with elevation today. Monitor closely next several weeks and be in touch if consistently over 140/90  #2 hyperlipidemia. Recheck lipid and hepatic panel #3 essential tremor controlled with Inderal. Continue current dosage #4 chronic low back pain with history of degenerative spondylosis. Discussed getting back and physical therapy this point she's decided to do home exercises first. She been very reluctant to consider surgery in the past

## 2011-07-19 ENCOUNTER — Telehealth: Payer: Self-pay | Admitting: *Deleted

## 2011-07-19 NOTE — Telephone Encounter (Signed)
FYI - patient is calling because her blood pressure has been running around 140-150/90 .  She was asked to call and let Dr Caryl Never know.   She uses [facility], [address] [place]

## 2011-07-22 ENCOUNTER — Telehealth: Payer: Self-pay | Admitting: *Deleted

## 2011-07-22 NOTE — Telephone Encounter (Signed)
Pt is asking for someone to call her and let her know what Dr. Caryl Never thought about her high BP numbers last Friday.  She did not hear from anyone. Thanks.  She states it is still running high.

## 2011-07-22 NOTE — Telephone Encounter (Signed)
Change lisinopril to lisinopril hctz 20/12.5 mg one daily and office follow up one month.

## 2011-07-23 ENCOUNTER — Other Ambulatory Visit: Payer: Self-pay | Admitting: Dermatology

## 2011-07-23 ENCOUNTER — Other Ambulatory Visit: Payer: Self-pay | Admitting: Family Medicine

## 2011-07-23 MED ORDER — LISINOPRIL-HYDROCHLOROTHIAZIDE 20-12.5 MG PO TABS: 1.0000 | ORAL_TABLET | Freq: Every day | ORAL | Status: DC

## 2011-07-24 NOTE — Telephone Encounter (Signed)
Pt informed

## 2011-08-19 ENCOUNTER — Ambulatory Visit (INDEPENDENT_AMBULATORY_CARE_PROVIDER_SITE_OTHER): Payer: Medicare Other | Admitting: Family Medicine

## 2011-08-19 ENCOUNTER — Encounter: Payer: Self-pay | Admitting: Family Medicine

## 2011-08-19 VITALS — BP 130/72 | Temp 98.0°F | Wt 155.0 lb

## 2011-08-19 DIAGNOSIS — M545 Low back pain, unspecified: Secondary | ICD-10-CM

## 2011-08-19 DIAGNOSIS — I10 Essential (primary) hypertension: Secondary | ICD-10-CM

## 2011-08-19 DIAGNOSIS — C4492 Squamous cell carcinoma of skin, unspecified: Secondary | ICD-10-CM | POA: Insufficient documentation

## 2011-08-19 NOTE — Progress Notes (Signed)
  Subjective:    Patient ID: Carmen Duncan, female    DOB: 1940-12-14, 71 y.o.   MRN: [id number]  HPI  Medical followup. She has history of hypertension treated with lisinopril HCTZ. Brings a log of readings most these are fairly well controlled. Mostly below 140/90. She denies dizziness. She also takes propranolol 40 mg daily for essential tremor. Denies any chest pains. No dyspnea.  Chronic low back pain. Several months to years duration. No radiculopathy symptoms. Mid lower lumbar region. Previous MRI scan 2010 and revealed degenerative changes. She's had no x-rays since then. No appetite or weight changes. Denies fever or chills. No incontinence symptoms. No weakness or numbness. Pain worse with ambulation. No radiculopathy symptoms. Recent diagnosis squamous cell skin cancer followed by dermatology. 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 Arthritis   . Asthma   . Hypertension   . GERD (gastroesophageal reflux disease)   . Hyperlipidemia   . Diverticulosis   . IBS (irritable bowel syndrome)    Past Surgical History  Procedure Date  . Cholecystectomy 2011  . Abdominal hysterectomy 1980  . Tonsillectomy 1945  . Biopsy breast   . Eyes 2007    reports that she has never smoked. She does not have any smokeless tobacco history on file. Her alcohol and drug histories not on file. family history includes Heart disease (age of onset:50) in her father; Hyperlipidemia in her father and mother; Hypertension in her mother; and Stroke in her father. Allergies  Allergen Reactions  . Latex     Red angry skin from latex tape  . Zocor (Simvastatin)     nausea  . Sulfonamide Derivatives Rash    hives      Review of Systems  Constitutional: Negative for fever, chills, activity change, appetite change and unexpected weight change.  Respiratory: Negative for cough and shortness of breath.   Gastrointestinal: Negative for nausea, vomiting and  abdominal pain.  Genitourinary: Negative for dysuria.  Musculoskeletal: Positive for back pain.  Skin: Negative for rash.  Hematological: Negative for adenopathy.       Objective:   Physical Exam  Constitutional: She is oriented to person, place, and time. She appears well-developed and well-nourished. No distress.  Neck: Neck supple.  Cardiovascular: Normal rate and regular rhythm.   Pulmonary/Chest: Effort normal and breath sounds normal. No respiratory distress. She has no wheezes. She has no rales.  Musculoskeletal: She exhibits no edema.  Neurological: She is alert and oriented to person, place, and time. She has normal reflexes. No cranial nerve deficit.       No strength deficits lower extremities          Assessment & Plan:  #1 hypertension. Stable and close to goal. Continue current medications #2 chronic low back pain. Known degenerative changes. She is concerned because of difficulties walking. Question lumbar stenosis. Repeat lumbar spine films.  If no acute abnormality consider physical therapy

## 2011-08-20 ENCOUNTER — Ambulatory Visit (INDEPENDENT_AMBULATORY_CARE_PROVIDER_SITE_OTHER)
Admission: RE | Admit: 2011-08-20 | Discharge: 2011-08-20 | Disposition: A | Discharge status: Home / Self Care | Payer: Medicare Other | Source: Ambulatory Visit | Attending: Family Medicine | Admitting: Family Medicine

## 2011-08-20 DIAGNOSIS — M545 Low back pain, unspecified: Secondary | ICD-10-CM

## 2011-08-21 ENCOUNTER — Telehealth: Payer: Self-pay | Admitting: Family Medicine

## 2011-08-21 DIAGNOSIS — M545 Low back pain, unspecified: Secondary | ICD-10-CM

## 2011-08-21 NOTE — Telephone Encounter (Signed)
Pt would like a referral for physical therapy for back pain

## 2011-08-21 NOTE — Telephone Encounter (Signed)
I will refer.  Let patient know.

## 2011-08-21 NOTE — Progress Notes (Signed)
Quick Note:  Pt informed on personally identified cell VM ______ 

## 2011-08-21 NOTE — Telephone Encounter (Signed)
Pt informed on cell VM 

## 2011-08-28 ENCOUNTER — Ambulatory Visit: Payer: Medicare Other | Attending: Family Medicine

## 2011-08-28 DIAGNOSIS — IMO0001 Reserved for inherently not codable concepts without codable children: Secondary | ICD-10-CM | POA: Insufficient documentation

## 2011-08-28 DIAGNOSIS — R5381 Other malaise: Secondary | ICD-10-CM | POA: Insufficient documentation

## 2011-08-28 DIAGNOSIS — M545 Low back pain, unspecified: Secondary | ICD-10-CM | POA: Insufficient documentation

## 2011-08-28 DIAGNOSIS — M25659 Stiffness of unspecified hip, not elsewhere classified: Secondary | ICD-10-CM | POA: Insufficient documentation

## 2011-09-04 ENCOUNTER — Encounter: Payer: Medicare Other | Admitting: Physical Therapy

## 2011-09-11 ENCOUNTER — Ambulatory Visit: Payer: Medicare Other | Admitting: Physical Therapy

## 2011-09-12 ENCOUNTER — Other Ambulatory Visit: Payer: Self-pay | Admitting: Family Medicine

## 2011-09-20 ENCOUNTER — Ambulatory Visit: Payer: Medicare Other | Admitting: Physical Therapy

## 2011-09-24 ENCOUNTER — Ambulatory Visit: Payer: Medicare Other | Admitting: Physical Therapy

## 2011-09-24 ENCOUNTER — Encounter: Payer: Medicare Other | Admitting: Physical Therapy

## 2011-10-01 ENCOUNTER — Ambulatory Visit: Payer: Medicare Other | Admitting: Physical Therapy

## 2011-10-02 ENCOUNTER — Ambulatory Visit: Payer: Medicare Other | Attending: Family Medicine

## 2011-10-02 DIAGNOSIS — M25659 Stiffness of unspecified hip, not elsewhere classified: Secondary | ICD-10-CM | POA: Insufficient documentation

## 2011-10-02 DIAGNOSIS — M545 Low back pain, unspecified: Secondary | ICD-10-CM | POA: Insufficient documentation

## 2011-10-02 DIAGNOSIS — IMO0001 Reserved for inherently not codable concepts without codable children: Secondary | ICD-10-CM | POA: Insufficient documentation

## 2011-10-02 DIAGNOSIS — R5381 Other malaise: Secondary | ICD-10-CM | POA: Insufficient documentation

## 2011-10-04 ENCOUNTER — Other Ambulatory Visit: Payer: Self-pay | Admitting: Gastroenterology

## 2011-10-08 ENCOUNTER — Ambulatory Visit: Payer: Medicare Other | Admitting: Physical Therapy

## 2011-10-17 ENCOUNTER — Encounter: Payer: Self-pay | Admitting: Family Medicine

## 2011-11-19 ENCOUNTER — Ambulatory Visit: Payer: Medicare Other | Admitting: Family Medicine

## 2011-11-20 ENCOUNTER — Encounter: Payer: Self-pay | Admitting: Family Medicine

## 2011-11-20 ENCOUNTER — Ambulatory Visit (INDEPENDENT_AMBULATORY_CARE_PROVIDER_SITE_OTHER): Payer: Medicare Other | Admitting: Family Medicine

## 2011-11-20 VITALS — BP 150/78 | Temp 97.8°F | Wt 156.0 lb

## 2011-11-20 DIAGNOSIS — E785 Hyperlipidemia, unspecified: Secondary | ICD-10-CM

## 2011-11-20 DIAGNOSIS — I10 Essential (primary) hypertension: Secondary | ICD-10-CM

## 2011-11-20 NOTE — Progress Notes (Signed)
  Subjective:    Patient ID: Carmen Duncan, female    DOB: 12/25/1940, 71 y.o.   MRN: [id number]  HPI  Patient is seen for routine medical follow patient history hypertension, hyperlipidemia, GERD, irritable bowel syndrome. Saw gastroenterologist recently and had upper endoscopy with ulcer felt to be related to meloxicam. She is now off nonsteroidals with the exception of baby aspirin. She had omeprazole increased to twice daily. Denies any abdominal pain. Compliant with all medications. Recent lipids at goal. Occasional fatigue issues with Inderal. She is taking this for essential tremor. Blood pressures consistently 130 systolic and 80 diastolic by home readings.   Review of Systems  Constitutional: Positive for fatigue.  Eyes: Negative for visual disturbance.  Respiratory: Negative for cough, chest tightness, shortness of breath and wheezing.   Cardiovascular: Negative for chest pain, palpitations and leg swelling.  Neurological: Negative for dizziness, seizures, syncope, weakness, light-headedness and headaches.       Objective:   Physical Exam  Constitutional: She appears well-developed and well-nourished.  Neck: Neck supple. No thyromegaly present.  Cardiovascular: Normal rate and regular rhythm.   Pulmonary/Chest: Effort normal and breath sounds normal. No respiratory distress. She has no wheezes. She has no rales.  Musculoskeletal: She exhibits no edema.          Assessment & Plan:  #1 hypertension. Probable whitecoat syndrome. Marginal control by reading today. Continue home monitoring. Reassess in 2 months. Bring home blood pressure cuff then #2 hyperlipidemia. Recent lipids at goal. Continue Crestor.

## 2011-11-20 NOTE — Patient Instructions (Addendum)

## 2011-12-30 ENCOUNTER — Other Ambulatory Visit: Payer: Self-pay | Admitting: *Deleted

## 2011-12-30 DIAGNOSIS — I10 Essential (primary) hypertension: Secondary | ICD-10-CM

## 2011-12-30 MED ORDER — PROPRANOLOL HCL 40 MG PO TABS: 40.0000 mg | ORAL_TABLET | Freq: Two times a day (BID) | ORAL | Status: DC

## 2011-12-31 ENCOUNTER — Other Ambulatory Visit: Payer: Self-pay | Admitting: *Deleted

## 2011-12-31 MED ORDER — LISINOPRIL-HYDROCHLOROTHIAZIDE 20-12.5 MG PO TABS: 1.0000 | ORAL_TABLET | Freq: Every day | ORAL | Status: DC

## 2012-01-02 ENCOUNTER — Encounter: Payer: Self-pay | Admitting: Family Medicine

## 2012-01-02 ENCOUNTER — Ambulatory Visit (INDEPENDENT_AMBULATORY_CARE_PROVIDER_SITE_OTHER): Payer: Medicare Other | Admitting: Family Medicine

## 2012-01-02 VITALS — BP 138/80 | Temp 97.8°F | Wt 153.0 lb

## 2012-01-02 DIAGNOSIS — I10 Essential (primary) hypertension: Secondary | ICD-10-CM

## 2012-01-02 NOTE — Progress Notes (Signed)
  Subjective:    Patient ID: Carmen Duncan, female    DOB: 1940/07/27, 71 y.o.   MRN: [id number]  HPI  Patient seen followup hypertension. Very well controlled by home readings. Consistently less than 130/80. Compliant with medications. No dizziness. No exertional chest pain. No dyspnea. No peripheral edema issues. Patient brings in home monitor today. Hyperlipidemia treated Crestor. Lipids were checked in February and stable. No history of diabetes. Nonsmoker.  Past Medical History  Diagnosis Date  . Arthritis   . Asthma   . Hypertension   . GERD (gastroesophageal reflux disease)   . Hyperlipidemia   . Diverticulosis   . IBS (irritable bowel syndrome)    Past Surgical History  Procedure Date  . Cholecystectomy 2011  . Abdominal hysterectomy 1980  . Tonsillectomy 1945  . Biopsy breast   . Eyes 2007    reports that she has never smoked. She does not have any smokeless tobacco history on file. Her alcohol and drug histories not on file. family history includes Heart disease (age of onset:50) in her father; Hyperlipidemia in her father and mother; Hypertension in her mother; and Stroke in her father. Allergies  Allergen Reactions  . Latex     Red angry skin from latex tape  . Zocor (Simvastatin)     nausea  . Sulfonamide Derivatives Rash    hives      Review of Systems  Constitutional: Negative for fatigue.  Eyes: Negative for visual disturbance.  Respiratory: Negative for cough, chest tightness, shortness of breath and wheezing.   Cardiovascular: Negative for chest pain, palpitations and leg swelling.  Neurological: Negative for dizziness, seizures, syncope, weakness, light-headedness and headaches.       Objective:   Physical Exam  Constitutional: She appears well-developed and well-nourished.  Neck: Neck supple. No thyromegaly present.  Cardiovascular: Normal rate and regular rhythm.  Exam reveals no gallop.   Pulmonary/Chest: Effort normal and breath sounds normal.  No respiratory distress. She has no wheezes. She has no rales.  Musculoskeletal: She exhibits no edema.          Assessment & Plan:  Hypertension. Probable white coat syndrome. Well controlled by home readings. Continue current regimen. Routine followup 6 months. Check labs including lipid panel, hepatic panel, and basic metabolic panel at that time

## 2012-01-03 ENCOUNTER — Other Ambulatory Visit: Payer: Self-pay | Admitting: *Deleted

## 2012-01-03 DIAGNOSIS — I10 Essential (primary) hypertension: Secondary | ICD-10-CM

## 2012-01-03 MED ORDER — PROPRANOLOL HCL 40 MG PO TABS: 40.0000 mg | ORAL_TABLET | Freq: Two times a day (BID) | ORAL | Status: DC

## 2012-01-10 ENCOUNTER — Other Ambulatory Visit: Payer: Self-pay | Admitting: Family Medicine

## 2012-01-16 ENCOUNTER — Other Ambulatory Visit: Payer: Self-pay | Admitting: *Deleted

## 2012-01-16 MED ORDER — CILIDINIUM-CHLORDIAZEPOXIDE 2.5-5 MG PO CAPS: 1.0000 | ORAL_CAPSULE | Freq: Two times a day (BID) | ORAL | Status: DC | PRN

## 2012-03-09 ENCOUNTER — Other Ambulatory Visit: Payer: Self-pay | Admitting: *Deleted

## 2012-03-09 MED ORDER — CILIDINIUM-CHLORDIAZEPOXIDE 2.5-5 MG PO CAPS: 1.0000 | ORAL_CAPSULE | Freq: Two times a day (BID) | ORAL | Status: DC | PRN

## 2012-04-10 ENCOUNTER — Other Ambulatory Visit: Payer: Self-pay | Admitting: *Deleted

## 2012-04-10 DIAGNOSIS — E785 Hyperlipidemia, unspecified: Secondary | ICD-10-CM

## 2012-05-28 ENCOUNTER — Telehealth: Payer: Self-pay | Admitting: Family Medicine

## 2012-05-28 LAB — HM MAMMOGRAPHY: HM Mammogram: NEGATIVE

## 2012-05-28 MED ORDER — CILIDINIUM-CHLORDIAZEPOXIDE 2.5-5 MG PO CAPS: 1.0000 | ORAL_CAPSULE | Freq: Two times a day (BID) | ORAL | Status: DC | PRN

## 2012-05-28 NOTE — Telephone Encounter (Signed)
Patient has switched pharmacies, due to insurance. All rx will now be through [facility] on [address]. They will be contacting us today to get refill on clidinium-chlordiazePOXIDE (LIBRAX) 2.5-5 MG per capsule

## 2012-06-01 ENCOUNTER — Encounter: Payer: Self-pay | Admitting: Family Medicine

## 2012-07-08 ENCOUNTER — Ambulatory Visit: Payer: Medicare Other | Admitting: Family Medicine

## 2012-07-14 ENCOUNTER — Encounter: Payer: Self-pay | Admitting: Family Medicine

## 2012-07-14 ENCOUNTER — Ambulatory Visit (INDEPENDENT_AMBULATORY_CARE_PROVIDER_SITE_OTHER): Payer: Medicare Other | Admitting: Family Medicine

## 2012-07-14 VITALS — BP 160/90 | Wt 150.0 lb

## 2012-07-14 DIAGNOSIS — I10 Essential (primary) hypertension: Secondary | ICD-10-CM

## 2012-07-14 DIAGNOSIS — G250 Essential tremor: Secondary | ICD-10-CM

## 2012-07-14 DIAGNOSIS — G252 Other specified forms of tremor: Secondary | ICD-10-CM

## 2012-07-14 DIAGNOSIS — E785 Hyperlipidemia, unspecified: Secondary | ICD-10-CM

## 2012-07-14 LAB — BASIC METABOLIC PANEL
BUN: 15 mg/dL (ref 6–23)
CO2: 30 mEq/L (ref 19–32)
Calcium: 9.7 mg/dL (ref 8.4–10.5)
GFR: 74.52 mL/min (ref >60.00)
Glucose, Bld: 88 mg/dL (ref 70–99)

## 2012-07-14 LAB — HEPATIC FUNCTION PANEL
ALT: 18 U/L (ref 0–35)
AST: 20 U/L (ref 0–37)
Bilirubin, Direct: 0.1 mg/dL (ref 0.0–0.3)
Total Bilirubin: 0.7 mg/dL (ref 0.3–1.2)

## 2012-07-14 NOTE — Progress Notes (Signed)
  Subjective:    Patient ID: Carmen Duncan, female    DOB: 03-31-1941, 72 y.o.   MRN: [id number]  HPI Patient seen for medical followup. She has history of hypertension, hyperlipidemia, GERD, and essential tremor. Medications reviewed. Compliant with all.  Blood pressures have been stable by home readings. Mostly around 130/80. No dizziness. No headaches. No chest pain.  Long history of essential tremor. Sister has similar tremor. She was evaluated at [facility] about 4 years ago and they confirmed her diagnosis. Her tremor is left upper extremity greater than right and worse with fatigue. She's been on propranolol 40 mg twice daily for several years. Seems to be progressing especially over the past year. She would like to consider other options.  Hyperlipidemia treated with Crestor 10 mg daily. Previous intolerance with Lipitor.  Past Medical History  Diagnosis Date  . Arthritis   . Asthma   . Hypertension   . GERD (gastroesophageal reflux disease)   . Hyperlipidemia   . Diverticulosis   . IBS (irritable bowel syndrome)    Past Surgical History  Procedure Laterality Date  . Cholecystectomy  2011  . Abdominal hysterectomy  1980  . Tonsillectomy  1945  . Biopsy breast    . Eyes  2007    reports that she has never smoked. She does not have any smokeless tobacco history on file. Her alcohol and drug histories are not on file. family history includes Heart disease (age of onset: 74) in her father; Hyperlipidemia in her father and mother; Hypertension in her mother; and Stroke in her father. Allergies  Allergen Reactions  . Latex     Red angry skin from latex tape  . Zocor (Simvastatin)     nausea  . Sulfonamide Derivatives Rash    hives     Review of Systems  Constitutional: Negative for fatigue.  Eyes: Negative for visual disturbance.  Respiratory: Negative for cough, chest tightness, shortness of breath and wheezing.   Cardiovascular: Negative for chest pain,  palpitations and leg swelling.  Neurological: Negative for dizziness, seizures, syncope, weakness, light-headedness and headaches.       Objective:   Physical Exam  Constitutional: She is oriented to person, place, and time. She appears well-developed and well-nourished.  Neck: Neck supple. No thyromegaly present.  Cardiovascular: Normal rate and regular rhythm.   Pulmonary/Chest: Effort normal and breath sounds normal. No respiratory distress. She has no wheezes. She has no rales.  Musculoskeletal: She exhibits no edema.  Lymphadenopathy:    She has no cervical adenopathy.  Neurological: She is alert and oriented to person, place, and time.  No focal strength deficits. She has mild tremor upper extremities which extinguishes somewhat with purposeful movement. No cogwheel rigidity          Assessment & Plan:  #1 hypertension. Elevation today but generally well-controlled at home readings and previous readings here. Monitor closely over the next few weeks. Consider addition of amlodipine if remains elevated #2 essential tremor. Patient states is worsening. Consult with neurology. She may benefit from change of medication such as Mysoline and we'll defer to neurology #3 hyperlipidemia. Check lipid and hepatic panel

## 2012-07-15 NOTE — Progress Notes (Signed)
Quick Note:  Pt informed ______ 

## 2012-08-04 ENCOUNTER — Encounter: Payer: Self-pay | Admitting: Neurology

## 2012-08-04 ENCOUNTER — Other Ambulatory Visit: Payer: Self-pay | Admitting: Neurology

## 2012-08-04 ENCOUNTER — Ambulatory Visit (INDEPENDENT_AMBULATORY_CARE_PROVIDER_SITE_OTHER): Payer: Medicare Other | Admitting: Neurology

## 2012-08-04 ENCOUNTER — Other Ambulatory Visit: Payer: Self-pay | Admitting: *Deleted

## 2012-08-04 VITALS — BP 130/70 | HR 68 | Temp 97.6°F | Resp 12 | Ht 61.0 in | Wt 149.0 lb

## 2012-08-04 DIAGNOSIS — G250 Essential tremor: Secondary | ICD-10-CM

## 2012-08-04 DIAGNOSIS — G609 Hereditary and idiopathic neuropathy, unspecified: Secondary | ICD-10-CM

## 2012-08-04 LAB — VITAMIN B12: Vitamin B-12: 345 pg/mL (ref 211–911)

## 2012-08-04 MED ORDER — PRIMIDONE 50 MG PO TABS: 50.0000 mg | ORAL_TABLET | Freq: Every day | ORAL | Status: DC

## 2012-08-04 NOTE — Addendum Note (Signed)
Addended by: Lelon Huh on: 08/04/2012 02:11 PM   Modules accepted: Orders

## 2012-08-04 NOTE — Patient Instructions (Signed)
1.  Stay on propranolol 2.  Start primidone - 50 mg - 1/2 tablet at night for a week, then increase to one tablet nightly 3.  Try to get me a copy of your MRI brain films 4.  We will do labs.  Feel free to call in a week for the results if you want 5.  Follow up in 3 months.  Call if problems sooner

## 2012-08-04 NOTE — Progress Notes (Signed)
Subjective:   Carmen Duncan was seen in consultation in the movement disorder clinic at the request of [name], MD.  The evaluation is for tremor.  The patient is a 72 y.o. R handed female with a long hx of tremor.   The patient states that the tremor is in the L hand.  She notes it with activation.  If pouring something from a saucepan, it will start to tremor.  It seems to be worse in specific positions.  She has no trouble with putting on makeup or eating as this only affects her nondominant hand.  She has no trouble cutting food.  She has no vocal tremor, head tremor or RUE tremor.  She has no leg tremor.  Sometimes she feels like she is "walking like a drunk" because she is not stable.  She does not feel comfortable near the edge of a pier b/c she feels like she would fall off the edge.  She generally does not fall.  She did fall Jul 16, 2011 because she missed the last 3 steps at her home when she was coming down in a hurry.  She bruised her entire R leg and it is still very sore.    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  I do not have those records.  She saw [name].  She is on propranolol and she has been on it 7-8 years.  She is on 40 mg twice per day.  It does help.  She states that she was told that her propranolol could not be increased.  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  She sees a Midwife at [facility] and was told it was nothing to worry about.  She has it monitored q 3 years.  Her last MRI brain was done in [place].   Because of this, she will have intermittent numbness of the L face.    There was a fam hx of tremor in her sister and it is starting in her son, who is 4 years old.  Current/Previously tried tremor medications: propranolol  Current medications that may exacerbate tremor:  n/a  Outside reports reviewed: historical medical records.  Allergies  Allergen Reactions   . Latex     Red angry skin from latex tape  . Zocor (Simvastatin)     nausea  . Sulfonamide Derivatives Rash    hives    Current Outpatient Prescriptions on File Prior to Visit  Medication Sig Dispense Refill  . aspirin 81 MG tablet Take 81 mg by mouth daily.        . clidinium-chlordiazePOXIDE (LIBRAX) 2.5-5 MG per capsule Take 1 capsule by mouth 2 (two) times daily as needed.  60 capsule  0  . CRESTOR 10 MG tablet TAKE 1 TABLET (10 MG TOTAL) BY MOUTH AT BEDTIME.  30 tablet  5  . lisinopril-hydrochlorothiazide (PRINZIDE,ZESTORETIC) 20-12.5 MG per tablet Take 1 tablet by mouth daily.  90 tablet  3  . omeprazole (PRILOSEC OTC) 20 MG tablet Take 20 mg by mouth. 2 times daily per Dr Randa Evens      . Probiotic Product (ALIGN) 4 MG CAPS Take by mouth daily.        . propranolol (INDERAL) 40 MG tablet Take 1 tablet (40 mg total) by mouth 2 (two) times daily.  180 tablet  3  . rosuvastatin (CRESTOR) 10 MG tablet Take 1 tablet (10  mg total) by mouth at bedtime.  90 tablet  3   No current facility-administered medications on file prior to visit.    Past Medical History  Diagnosis Date  . Arthritis   . Asthma   . Hypertension   . GERD (gastroesophageal reflux disease)   . Hyperlipidemia   . Diverticulosis   . IBS (irritable bowel syndrome)     Past Surgical History  Procedure Laterality Date  . Cholecystectomy  2011  . Abdominal hysterectomy  1980  . Tonsillectomy  1945  . Biopsy breast    . Eyes  2007    History   Social History  . Marital Status: Widowed    Spouse Name: N/A    Number of Children: N/A  . Years of Education: N/A   Occupational History  . Not on file.   Social History Main Topics  . Smoking status: Never Smoker   . Smokeless tobacco: Not on file  . Alcohol Use: Not on file  . Drug Use: Not on file  . Sexually Active: Yes -- Female partner(s)   Other Topics Concern  . Not on file   Social History Narrative  . No narrative on file    No family  status information on file.    Review of Systems A complete 10 system ROS was obtained and was negative apart from what is mentioned.   Objective:   VITALS:   Filed Vitals:   08/04/12 1226  BP: 130/70  Pulse: 68  Temp: 97.6 F (36.4 C)  Resp: 12  Height: 5\' 1"  (1.549 m)  Weight: 149 lb (67.586 kg)   Gen:  Appears stated age and in NAD. HEENT:  Normocephalic, atraumatic. The mucous membranes are moist. The superficial temporal arteries are without ropiness or tenderness. Cardiovascular: Regular rate and rhythm. Lungs: Clear to auscultation bilaterally. Neck: There are no carotid bruits noted bilaterally.  NEUROLOGICAL:  Orientation:  The patient is alert and oriented x 3.  Recent and remote memory are intact.  Attention span and concentration are normal.  Able to name objects and repeat without trouble.  Fund of knowledge is appropriate Cranial nerves: There is good facial symmetry.  There is pseudoptosis from lid lag bilaterally.   The pupils are equal round and reactive to light bilaterally. Fundoscopic exam reveals clear disc margins bilaterally. Extraocular muscles are intact and visual fields are full to confrontational testing. Speech is fluent and clear. Soft palate rises symmetrically and there is no tongue deviation. Hearing is intact to conversational tone. Tone: Tone is good throughout.  There is no rigidity. Sensation: Sensation is intact to light touch and pinprick throughout (facial, trunk, extremities). Vibration is decreased at the bilateral big toe. There is no extinction with double simultaneous stimulation. There is no sensory dermatomal level identified. Coordination:  The patient has no dysdiadichokinesia or dysmetria. Motor: Strength is 5/5 in the bilateral upper and lower extremities.  Shoulder shrug is equal bilaterally.  There is no pronator drift.  There are no fasciculations noted. DTR's: Deep tendon reflexes are 1/4 at the bilateral biceps, triceps,  brachioradialis, left patella (did not test at the right patella because of pain) and absent at the bilateral achilles.  Plantar responses are downgoing bilaterally. Gait and Station: The patient is able to ambulate without difficulty. She has some difficulty ambulating in a tandem fashion.  She sways in the Romberg position, but was able to stay in place with eyes closed.  MOVEMENT EXAM: Tremor:  There is a tremor  in the UE, noted most significantly with intention, on the left only.    There is no tremor at rest.  The patient has difficulty pouring water from one glass to another, but only has trouble because of the left hand tremor which increases with weight in the hand.      Assessment/Plan:   1.  Essential Tremor.  -it is somewhat atypical that it is only in one hand, but the slow nature, absence of any other disease and presence of family history suggests that this is the diagnoses.  -I did ask her to get me a copy of her MRI of the brain from [place] that I can look at that.  She is being followed for an acoustic neuroma, but I do not think it has anything to do with tremor.  -she will continue on the propranolol, 40 mg twice a day.  We cannot increase the dosage because of her car pulse in the 60s.  We decided to add primidone at a very low dosage.  Risks, benefits, side effects and alternative therapies were discussed.  The opportunity to ask questions was given and they were answered to the best of my ability.  The patient expressed understanding and willingness to follow the outlined treatment protocols. 2.  Mild gait instability.  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  This seems overall very mild.  We talked about the diagnosis.  -We will do some lab work to rule out reversible causes.  -We talked about safety associated with peripheral neuropathy. 3.  Acoustic neuroma.  -This is being followed by ENT and neurosurgery at [facility]. 4.  Return in about 3 months  (around 11/04/2012). 5.  Time in the room with the patient: 80 min

## 2012-08-05 LAB — RPR

## 2012-08-06 LAB — SPEP & IFE WITH QIG
Alpha-1-Globulin: 4.5 % (ref 2.9–4.9)
Alpha-2-Globulin: 10.1 % (ref 7.1–11.8)
IgG (Immunoglobin G), Serum: 869 mg/dL (ref 690–1700)
IgM, Serum: 73 mg/dL (ref 52–322)
Total Protein, Serum Electrophoresis: 6.8 g/dL (ref 6.0–8.3)

## 2012-08-07 LAB — UIFE/LIGHT CHAINS/TP QN, 24-HR UR: Free Lambda Lt Chains,Ur: <0.03 mg/dL (ref 0.02–0.67)

## 2012-08-25 ENCOUNTER — Telehealth: Payer: Self-pay | Admitting: Neurology

## 2012-08-25 NOTE — Telephone Encounter (Signed)
Yes, I reviewed her MRI.  I did not find anything new other than what she knew (neuroma, and some age related hardening of arteries in brain not contributing to her symptoms and requiring no further intervention).  Labs looked ok.  Primidone (as I told her) is an old seizure medication so people can have seizures if they have epilepsy and are on the medication and its not working or if they abruptly stop it.  She is on lowest dose available, as I reviewed with her previously.

## 2012-08-25 NOTE — Telephone Encounter (Signed)
Picked up a call from the patient who states she had a few questions for Dr. Arbutus Leas. First she wanted to know if Dr. Arbutus Leas knew she was on Propranolol as she was getting ready to start the Primidone and I told her yes, she knew. She also wanted to know if it (Primidone) was a low dose so she wouldn't have the side effect of convulsions which she read in her handout was a possibility with the medication. I read to her from the office visit summary all of Dr. Don Perking thoughts and the patient was fine with that. She did want to know the results of the blood work she had done here in the office on 3/11 and also if she had had time to review the MRI she brought over from [facility]. I told her that I would check with her and that I would call her back as soon as I heard back from her. The patient states she is fine to wait. **Dr. Arbutus Leas, please advise lab results and review of her MRI. Thanks.

## 2012-08-26 NOTE — Telephone Encounter (Signed)
Called and spoke with the patient.  Information given as per Dr. Arbutus Leas below re: MRI and lab results. The patient states she took her first Primidone pill last night and she was not feeling well this morning. In discussing the medication, she states she took a whole pill when she should have been started with 1/2 tab at night for a week. She says she will try a half pill tonight and see how she feels in the morning. She c/o lightheadedness and says she would not be able to drive the way she feels now. Again advised she only take a half tab tonight and if able to tolerate that continue for a week. She states she will. Encouraged her to call if problems with the medication.

## 2012-10-01 ENCOUNTER — Telehealth: Payer: Self-pay | Admitting: Family Medicine

## 2012-10-01 NOTE — Telephone Encounter (Signed)
Recommend office visit to discuss.  There are several aspects to discuss.  We also had prior reported intolerance to Simvastatin.

## 2012-10-01 NOTE — Telephone Encounter (Signed)
Patient is almost out of Crestor. However, she wants a new lipid med, because she needs a GENERIC med, due to cost.  She wants to know if any of these 3 would be ok:  Simvastatin, pravastatin, or Atorvastatin (last choice). If there is any other generic that you think is best, let her know.  Please send one of these in to: [facility] on [address].  If pt needs OV to discuss, please discuss.

## 2012-10-01 NOTE — Telephone Encounter (Signed)
Ok thank you. Pt is scheduled for tomorrow for ear pain - will add this rx issue.

## 2012-10-02 ENCOUNTER — Encounter: Payer: Self-pay | Admitting: Family Medicine

## 2012-10-02 ENCOUNTER — Ambulatory Visit (INDEPENDENT_AMBULATORY_CARE_PROVIDER_SITE_OTHER): Payer: Medicare Other | Admitting: Family Medicine

## 2012-10-02 VITALS — BP 150/80 | Temp 97.8°F | Wt 148.0 lb

## 2012-10-02 DIAGNOSIS — H9209 Otalgia, unspecified ear: Secondary | ICD-10-CM

## 2012-10-02 DIAGNOSIS — E785 Hyperlipidemia, unspecified: Secondary | ICD-10-CM

## 2012-10-02 DIAGNOSIS — H9202 Otalgia, left ear: Secondary | ICD-10-CM

## 2012-10-02 MED ORDER — ATORVASTATIN CALCIUM 20 MG PO TABS: 20.0000 mg | ORAL_TABLET | Freq: Every day | ORAL | Status: DC

## 2012-10-02 NOTE — Progress Notes (Signed)
  Subjective:    Patient ID: Carmen Duncan, female    DOB: Jan 20, 1941, 72 y.o.   MRN: [id number]  HPI  Patient seen for the following issues  Hyperlipidemia treated with Crestor. Because of cost issues she would like to explore other options. Previously took simvastatin but apparently had some nausea. No reported myalgias. She has no history of CAD or peripheral vascular disease. She does not recall trying Lipitor or pravastatin previously  Complaint of some poorly localized pain left ear region past several weeks. Occasional popping sensation with opening and closing jaw. No hearing changes. No ear drainage. No headaches. Symptoms are mild to moderate. No alleviating factors. No pain with swallowing  History of essential tremor. Recently started on Mysoline. She feels that in general she's feeling more washed out since starting this medication.  Past Medical History  Diagnosis Date  . Arthritis   . Asthma   . Hypertension   . GERD (gastroesophageal reflux disease)   . Hyperlipidemia   . Diverticulosis   . IBS (irritable bowel syndrome)   . HOH (hard of hearing) left ear  . Acoustic neuroma    Past Surgical History  Procedure Laterality Date  . Cholecystectomy  2011  . Abdominal hysterectomy  1980  . Tonsillectomy  1945  . Biopsy breast    . Eyes  2007    cararacts    reports that she has never smoked. She does not have any smokeless tobacco history on file. She reports that  drinks alcohol. She reports that she does not use illicit drugs. family history includes Heart disease (age of onset: 58) in her father; Hyperlipidemia in her father and mother; Hypertension in her mother; and Stroke in her father. Allergies  Allergen Reactions  . Latex     Red angry skin from latex tape  . Zocor (Simvastatin)     nausea  . Sulfonamide Derivatives Rash    hives     Review of Systems  Constitutional: Negative for fever, chills, appetite change and unexpected weight change.   HENT: Positive for ear pain. Negative for hearing loss, sore throat, trouble swallowing, neck pain, tinnitus and ear discharge.   Respiratory: Negative for cough and shortness of breath.   Cardiovascular: Negative for chest pain.  Gastrointestinal: Negative for nausea and vomiting.  Neurological: Negative for headaches.  Hematological: Negative for adenopathy.       Objective:   Physical Exam  Constitutional: She appears well-developed and well-nourished.  HENT:  Right Ear: External ear normal.  Left Ear: External ear normal.  Mouth/Throat: Oropharynx is clear and moist.  Patient has some crepitus with opening and closing left TMJ.  Minimally tender over her left TMJ  Neck: Neck supple. No thyromegaly present.  No anterior or posterior cervical lymphadenopathy  Cardiovascular: Normal rate and regular rhythm.   Pulmonary/Chest: Effort normal and breath sounds normal. No respiratory distress. She has no wheezes. She has no rales.  Musculoskeletal: She exhibits no edema.  Lymphadenopathy:    She has no cervical adenopathy.  Skin: No rash noted.          Assessment & Plan:  #1 hyperlipidemia. Discontinue Crestor secondary to cost issues. Trial of Lipitor 20 mg once daily. Prescription written. Followup in 2-3 months and repeat lipids and hepatic panel then #2 left periauricular pain. 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 Avoid hard eat foods. Consider followup with oral surgeon if symptoms persist

## 2012-10-26 ENCOUNTER — Encounter: Payer: Self-pay | Admitting: Family Medicine

## 2012-10-26 ENCOUNTER — Ambulatory Visit (INDEPENDENT_AMBULATORY_CARE_PROVIDER_SITE_OTHER): Payer: Medicare Other | Admitting: Family Medicine

## 2012-10-26 VITALS — BP 120/70 | HR 73 | Temp 97.7°F | Resp 20 | Wt 148.0 lb

## 2012-10-26 DIAGNOSIS — J019 Acute sinusitis, unspecified: Secondary | ICD-10-CM

## 2012-10-26 MED ORDER — ATORVASTATIN CALCIUM 20 MG PO TABS: 20.0000 mg | ORAL_TABLET | Freq: Every day | ORAL | Status: DC

## 2012-10-26 MED ORDER — HYDROCODONE-HOMATROPINE 5-1.5 MG/5ML PO SYRP: 5.0000 mL | ORAL_SOLUTION | Freq: Four times a day (QID) | ORAL | Status: AC | PRN

## 2012-10-26 MED ORDER — AZITHROMYCIN 250 MG PO TABS: ORAL_TABLET | ORAL | Status: AC

## 2012-10-26 NOTE — Patient Instructions (Addendum)

## 2012-10-26 NOTE — Progress Notes (Signed)
  Subjective:    Patient ID: Carmen Duncan, female    DOB: Sep 24, 1940, 72 y.o.   MRN: [id number]  HPI Onset of illness over 10 days ago Initial sore throat with sneezing and productive cough. Now has wheezing and cough productive of yellow sputum. No facial pain Mild intermittent headache. No fever at this time. Taking mucinex.  Past Medical History  Diagnosis Date  . Arthritis   . Asthma   . Hypertension   . GERD (gastroesophageal reflux disease)   . Hyperlipidemia   . Diverticulosis   . IBS (irritable bowel syndrome)   . HOH (hard of hearing) left ear  . Acoustic neuroma    Past Surgical History  Procedure Laterality Date  . Cholecystectomy  2011  . Abdominal hysterectomy  1980  . Tonsillectomy  1945  . Biopsy breast    . Eyes  2007    cararacts    reports that she has never smoked. She does not have any smokeless tobacco history on file. She reports that  drinks alcohol. She reports that she does not use illicit drugs. family history includes Heart disease (age of onset: 38) in her father; Hyperlipidemia in her father and mother; Hypertension in her mother; and Stroke in her father. Allergies  Allergen Reactions  . Latex     Red angry skin from latex tape  . Zocor (Simvastatin)     nausea  . Sulfonamide Derivatives Rash    hives      Review of Systems  Constitutional: Positive for fatigue. Negative for fever and chills.  HENT: Positive for congestion and sore throat.   Respiratory: Positive for cough.   Cardiovascular: Negative for chest pain.       Objective:   Physical Exam  Constitutional: She appears well-developed and well-nourished.  HENT:  Right Ear: External ear normal.  Left Ear: External ear normal.  Mouth/Throat: Oropharynx is clear and moist.  Neck: Neck supple.  Cardiovascular: Normal rate and regular rhythm.   Pulmonary/Chest: Effort normal and breath sounds normal. No respiratory distress. She has no wheezes. She has no rales.   Lymphadenopathy:    She has no cervical adenopathy.          Assessment & Plan:  Acute bronchitis/acute sinusitis. Given duration of symptoms, start Zithromax for 5 days. Hycodan cough syrup for nighttime use as needed

## 2012-10-30 ENCOUNTER — Telehealth: Payer: Self-pay | Admitting: Family Medicine

## 2012-10-30 NOTE — Telephone Encounter (Signed)
Too soon for additional antibiotics (Z-max) lasts for 10 days.

## 2012-10-30 NOTE — Telephone Encounter (Signed)
Spoke to pt told her too soon for additional antibiotics Z-pak lasts for 10 days per Dr. Caryl Never. Pt verbalized understanding and stated she is still expectorating yellow mucus. Told pt to monitor and if no better after 7 days call office. Pt verbalized understanding.

## 2012-10-30 NOTE — Telephone Encounter (Signed)
Pt is in [place], [place]. Pt was in 6/2 w/ acute bronchitis. Pt has finished all meds, says she is better, but not well.  Still has productive cough. Pt thinks she may need another round of antibiotics. Pls advise. Pharm in [place], [place] is [facility] phone:  [phone]. Pt would like a return call.

## 2012-11-13 ENCOUNTER — Telehealth: Payer: Self-pay | Admitting: Family Medicine

## 2012-11-13 NOTE — Telephone Encounter (Signed)
I would take the plain Zyrtec

## 2012-11-13 NOTE — Telephone Encounter (Signed)
Pt was seen 6/2 for acute sinuitis. Pt states she still has drainage, brownish color. (once a day) Pt was given z-pak.. Pt is gradually getting better, not completely.. No cough, just terrible dranage. Pt would like to know if she need to take  , (zytec w/out decongestant.) pt on BP meds.  Pt would like your advice on what to do about this lingering drainage. Pls call.

## 2012-11-13 NOTE — Telephone Encounter (Signed)
Pt informed, she voiced understanding to give it some more time with Zyrtec

## 2012-11-19 ENCOUNTER — Ambulatory Visit: Payer: Medicare Other | Admitting: Neurology

## 2012-12-01 ENCOUNTER — Other Ambulatory Visit: Payer: Self-pay

## 2012-12-01 MED ORDER — LISINOPRIL-HYDROCHLOROTHIAZIDE 20-12.5 MG PO TABS: 1.0000 | ORAL_TABLET | Freq: Every day | ORAL | Status: DC

## 2012-12-03 ENCOUNTER — Encounter: Payer: Self-pay | Admitting: Neurology

## 2012-12-03 ENCOUNTER — Ambulatory Visit (INDEPENDENT_AMBULATORY_CARE_PROVIDER_SITE_OTHER): Payer: Medicare Other | Admitting: Neurology

## 2012-12-03 VITALS — BP 126/80 | HR 64 | Temp 97.4°F | Resp 18 | Wt 149.0 lb

## 2012-12-03 DIAGNOSIS — G250 Essential tremor: Secondary | ICD-10-CM

## 2012-12-03 DIAGNOSIS — G609 Hereditary and idiopathic neuropathy, unspecified: Secondary | ICD-10-CM

## 2012-12-03 MED ORDER — TOPIRAMATE 100 MG PO TABS: 100.0000 mg | ORAL_TABLET | Freq: Every day | ORAL | Status: DC

## 2012-12-03 MED ORDER — TOPIRAMATE 25 MG PO CPSP: ORAL_CAPSULE | ORAL | Status: DC

## 2012-12-03 NOTE — Patient Instructions (Addendum)
1.  Stop primidone 2.  Start topamax:  25 mg:  1 tablet daily for 1 week, then 2 tablets daily for 1 week and then 3 tablets daily for 1 week and then start the 100 mg dose and take one daily 3.  [address] [place]!

## 2012-12-03 NOTE — Progress Notes (Signed)
Subjective:   Carmen Duncan was seen in consultation in the movement disorder clinic at the request of [name], MD.  The evaluation is for tremor.  The patient is a 72 y.o. R handed female with a long hx of tremor.   The patient states that the tremor is in the L hand.  She notes it with activation.  If pouring something from a saucepan, it will start to tremor.  It seems to be worse in specific positions.  She has no trouble with putting on makeup or eating as this only affects her nondominant hand.  She has no trouble cutting food.  She has no vocal tremor, head tremor or RUE tremor.  She has no leg tremor.  Sometimes she feels like she is "walking like a drunk" because she is not stable.  She does not feel comfortable near the edge of a pier b/c she feels like she would fall off the edge.  She generally does not fall.  She did fall Jul 16, 2011 because she missed the last 3 steps at her home when she was coming down in a hurry.  She bruised her entire R leg and it is still very sore.    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  I do not have those records.  She saw [name].  She is on propranolol and she has been on it 7-8 years.  She is on 40 mg twice per day.  It does help.  She states that she was told that her propranolol could not be increased.  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  [facility] and was told it was nothing to worry about.  She has it monitored q 3 years.  Her last MRI brain was done in [place].   Because of this, she will have intermittent numbness of the L face.    There was a fam hx of tremor in her sister and it is starting in her son, who is 28 years old.  Current/Previously tried tremor medications: propranolol  Current medications that may exacerbate tremor:  N/a  12/03/12:  She was started on primidone last visit.  She is only taking 1/2 of a 50 mg  tablet at night.  She didn't go up to a full tablet because she did not like the way that it made her feel.  She wants to d/c the medication.  She is on propranolol but the dose cannot be increased because her pulse is already on the low end of normal.  She is having some intermittent paresthesias in her fingertips, right more than left.  She worked on last visit.  B12 was reported.  Serum protein electrophoresis did not reveal M spike.  RPR was negative.  Urinary protein electrophoresis showed some free kappa light chains but there was no monoclonal protein, no M spike.  RPR was negative.  I reviewed her MRI of the brain from hands.  This revealed moderate white matter disease.  There was evidence of a known acoustic neuroma.  Outside reports reviewed: historical medical records.  Allergies  Allergen Reactions  . Latex     Red angry skin from latex tape  . Zocor (Simvastatin)     nausea  . Sulfonamide Derivatives Rash    hives    Current Outpatient Prescriptions on File Prior to Visit  Medication Sig Dispense  Refill  . aspirin 81 MG tablet Take 81 mg by mouth daily.        [name] atorvastatin (LIPITOR) 20 MG tablet Take 1 tablet (20 mg total) by mouth daily.  90 tablet  3  . lisinopril-hydrochlorothiazide (PRINZIDE,ZESTORETIC) 20-12.5 MG per tablet Take 1 tablet by mouth daily.  90 tablet  3  . omeprazole (PRILOSEC OTC) 20 MG tablet Take 20 mg by mouth. 2 times daily per Dr Randa Evens      . primidone (MYSOLINE) 50 MG tablet Take 1 tablet (50 mg total) by mouth at bedtime.  30 tablet  5  . Probiotic Product (ALIGN) 4 MG CAPS Take by mouth daily.        . propranolol (INDERAL) 40 MG tablet Take 1 tablet (40 mg total) by mouth 2 (two) times daily.  180 tablet  3   No current facility-administered medications on file prior to visit.    Past Medical History  Diagnosis Date  . Arthritis   . Asthma   . Hypertension   . GERD (gastroesophageal reflux disease)   . Hyperlipidemia   .  Diverticulosis   . IBS (irritable bowel syndrome)   . HOH (hard of hearing) left ear  . Acoustic neuroma     Past Surgical History  Procedure Laterality Date  . Cholecystectomy  2011  . Abdominal hysterectomy  1980  . Tonsillectomy  1945  . Biopsy breast    . Eyes  2007    cararacts    History   Social History  . Marital Status: Widowed    Spouse Name: N/A    Number of Children: N/A  . Years of Education: N/A   Occupational History  . Not on file.   Social History Main Topics  . Smoking status: Never Smoker   . Smokeless tobacco: Never Used  . Alcohol Use: Yes     Comment: wine about 3 times a week  . Drug Use: No  . Sexually Active: Yes -- Female partner(s)   Other Topics Concern  . Not on file   Social History Narrative  . No narrative on file    Family Status  Relation Status Death Age  . Mother Deceased     acute leukemia  . Father Deceased     MI, CVA  . Sister Alive     tremor  . Child Alive     son, mild tremor    Review of Systems A complete 10 system ROS was obtained and was negative apart from what is mentioned.   Objective:   VITALS:   Filed Vitals:   12/03/12 1111  BP: 126/80  Pulse: 64  Temp: 97.4 F (36.3 C)  Resp: 18  Weight: 149 lb (67.586 kg)   Gen:  Appears stated age and in NAD. HEENT:  Normocephalic, atraumatic. The mucous membranes are moist. The superficial temporal arteries are without ropiness or tenderness. Cardiovascular: Regular rate and rhythm. Lungs: Clear to auscultation bilaterally. Neck: There are no carotid bruits noted bilaterally.  NEUROLOGICAL:  Orientation:  The patient is alert and oriented x 3.  Recent and remote memory are intact.  Attention span and concentration are normal.  Able to name objects and repeat without trouble.  Fund of knowledge is appropriate Cranial nerves: There is good facial symmetry.  There is pseudoptosis from lid lag bilaterally.   The pupils are equal round and reactive to  light bilaterally. Fundoscopic exam reveals clear disc margins bilaterally. Extraocular muscles are intact and visual  fields are full to confrontational testing. Speech is fluent and clear. Soft palate rises symmetrically and there is no tongue deviation. Hearing is intact to conversational tone. Tone: Tone is good throughout.  There is no rigidity. Sensation: Sensation is intact to light touch and pinprick throughout (facial, trunk, extremities). Vibration is decreased at the bilateral big toe. There is no extinction with double simultaneous stimulation. There is no sensory dermatomal level identified. Coordination:  The patient has no dysdiadichokinesia or dysmetria. Motor: Strength is 5/5 in the bilateral upper and lower extremities.  Shoulder shrug is equal bilaterally.  There is no pronator drift.  There are no fasciculations noted. DTR's: Deep tendon reflexes are 1/4 at the bilateral biceps, triceps, brachioradialis, left patella (did not test at the right patella because of pain) and absent at the bilateral achilles.  Plantar responses are downgoing bilaterally. Gait and Station: The patient is able to ambulate without difficulty. She has some difficulty ambulating in a tandem fashion.  She sways in the Romberg position, but was able to stay in place with eyes closed.  MOVEMENT EXAM: Tremor:  There is a tremor in the UE, noted most significantly with intention, on the left only.    There is no tremor at rest.  The patient has difficulty pouring water from one glass to another, but only has trouble because of the left hand tremor which increases with weight in the hand.  She has minimal difficulties with Archimedes spirals.     Assessment/Plan:   1.  Essential Tremor.  -it is somewhat atypical that it is only in one hand, but the slow nature, absence of any other disease and presence of family history suggests that this is the diagnoses.  -she will continue on the propranolol, 40 mg twice a  day.  We cannot increase the dosage because of her car pulse in the 60s.  We decided to discontinue the primidone because of side effects even at small dosage is and add Topamax.  Risks, benefits, side effects and alternative therapies were discussed.  There is no history of nephrolithiasis.  There is no history of acute angle closure glaucoma. The opportunity to ask questions was given and they were answered to the best of my ability.  The patient expressed understanding and willingness to follow the outlined treatment protocols. 2.  Mild gait instability.  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  This seems overall very mild.  We talked about the diagnosis.  -She will need a repeat UPEP in the future.  -We talked about safety associated with peripheral neuropathy. 3.  Acoustic neuroma.  -This is being followed by ENT and neurosurgery at [facility] 4.  No Follow-up on file. 5.  Time in the room with the patient: 25 min

## 2013-01-12 ENCOUNTER — Ambulatory Visit (INDEPENDENT_AMBULATORY_CARE_PROVIDER_SITE_OTHER): Payer: Medicare Other | Admitting: Family Medicine

## 2013-01-12 ENCOUNTER — Encounter: Payer: Self-pay | Admitting: Family Medicine

## 2013-01-12 VITALS — BP 124/68 | HR 60 | Temp 97.5°F | Wt 150.0 lb

## 2013-01-12 DIAGNOSIS — E785 Hyperlipidemia, unspecified: Secondary | ICD-10-CM

## 2013-01-12 DIAGNOSIS — I10 Essential (primary) hypertension: Secondary | ICD-10-CM

## 2013-01-12 LAB — HEPATIC FUNCTION PANEL
ALT: 20 U/L (ref 0–35)
AST: 24 U/L (ref 0–37)
Alkaline Phosphatase: 68 U/L (ref 39–117)
Bilirubin, Direct: 0.1 mg/dL (ref 0.0–0.3)
Total Bilirubin: 0.6 mg/dL (ref 0.3–1.2)

## 2013-01-12 NOTE — Patient Instructions (Addendum)
Fat and Cholesterol Control Diet Cholesterol levels in your body are determined significantly by your diet. Cholesterol levels may also be related to heart disease. The following material helps to explain this relationship and discusses what you can do to help keep your heart healthy. Not all cholesterol is bad. Low-density lipoprotein (LDL) cholesterol is the "bad" cholesterol. It may cause fatty deposits to build up inside your arteries. High-density lipoprotein (HDL) cholesterol is "good." It helps to remove the "bad" LDL cholesterol from your blood. Cholesterol is a very important risk factor for heart disease. Other risk factors are high blood pressure, smoking, stress, heredity, and weight. The heart muscle gets its supply of blood through the coronary arteries. If your LDL cholesterol is high and your HDL cholesterol is low, you are at risk for having fatty deposits build up in your coronary arteries. This leaves less room through which blood can flow. Without sufficient blood and oxygen, the heart muscle cannot function properly and you may feel chest pains (angina pectoris). When a coronary artery closes up entirely, a part of the heart muscle may die causing a heart attack (myocardial infarction). CHECKING CHOLESTEROL When your caregiver sends your blood to a lab to be examined for cholesterol, a complete lipid (fat) profile may be done. With this test, the total amount of cholesterol and levels of LDL and HDL are determined. Triglycerides are a type of fat that circulates in the blood. They can also be used to determine heart disease risk. The list below describes what the numbers should be: Test: Total Cholesterol. Less than 200 mg/dl. Test: LDL "bad cholesterol." Less than 100 mg/dl. Less than 70 mg/dl if you are at very high risk of a heart attack or sudden cardiac death. Test: HDL "good cholesterol." Greater than 50 mg/dl for women. Greater than 40 mg/dl for men. Test:  Triglycerides. Less than 150 mg/dl. CONTROLLING CHOLESTEROL WITH DIET Although exercise and lifestyle factors are important, your diet is key. That is because certain foods are known to raise cholesterol and others to lower it. The goal is to balance foods for their effect on cholesterol and more importantly, to replace saturated and trans fat with other types of fat, such as monounsaturated fat, polyunsaturated fat, and omega-3 fatty acids. On average, a person should consume no more than 15 to 17 g of saturated fat daily. Saturated and trans fats are considered "bad" fats, and they will raise LDL cholesterol. Saturated fats are primarily found in animal products such as meats, butter, and cream. However, that does not mean you need to give up all your favorite foods. Today, there are good tasting, low-fat, low-cholesterol substitutes for most of the things you like to eat. Choose low-fat or nonfat alternatives. Choose round or loin cuts of red meat. These types of cuts are lowest in fat and cholesterol. Chicken (without the skin), fish, veal, and ground [place] breast are great choices. Eliminate fatty meats, such as hot dogs and salami. Even shellfish have little or no saturated fat. Have a 3 oz (85 g) portion when you eat lean meat, poultry, or fish. Trans fats are also called "partially hydrogenated oils." They are oils that have been scientifically manipulated so that they are solid at room temperature resulting in a longer shelf life and improved taste and texture of foods in which they are added. Trans fats are found in stick margarine, some tub margarines, cookies, crackers, and baked goods.  When baking and cooking, oils are a great substitute for butter.  The monounsaturated oils are especially beneficial since it is believed they lower LDL and raise HDL. The oils you should avoid entirely are saturated tropical oils, such as coconut and palm.  Remember to eat a lot from food groups that are  naturally free of saturated and trans fat, including fish, fruit, vegetables, beans, grains (barley, rice, couscous, bulgur wheat), and pasta (without cream sauces).  IDENTIFYING FOODS THAT LOWER CHOLESTEROL  Soluble fiber may lower your cholesterol. This type of fiber is found in fruits such as apples, vegetables such as broccoli, potatoes, and carrots, legumes such as beans, peas, and lentils, and grains such as barley. Foods fortified with plant sterols (phytosterol) may also lower cholesterol. You should eat at least 2 g per day of these foods for a cholesterol lowering effect.  Read package labels to identify low-saturated fats, trans fat free, and low-fat foods at the supermarket. Select cheeses that have only 2 to 3 g saturated fat per ounce. Use a heart-healthy tub margarine that is free of trans fats or partially hydrogenated oil. When buying baked goods (cookies, crackers), avoid partially hydrogenated oils. Breads and muffins should be made from whole grains (whole-wheat or whole oat flour, instead of "flour" or "enriched flour"). Buy non-creamy canned soups with reduced salt and no added fats.  FOOD PREPARATION TECHNIQUES  Never deep-fry. If you must fry, either stir-fry, which uses very little fat, or use non-stick cooking sprays. When possible, broil, bake, or roast meats, and steam vegetables. Instead of putting butter or margarine on vegetables, use lemon and herbs, applesauce, and cinnamon (for squash and sweet potatoes), nonfat yogurt, salsa, and low-fat dressings for salads.  LOW-SATURATED FAT / LOW-FAT FOOD SUBSTITUTES Meats / Saturated Fat (g) Avoid: Steak, marbled (3 oz/85 g) / 11 g Choose: Steak, lean (3 oz/85 g) / 4 g Avoid: Hamburger (3 oz/85 g) / 7 g Choose: Hamburger, lean (3 oz/85 g) / 5 g Avoid: Ham (3 oz/85 g) / 6 g Choose: Ham, lean cut (3 oz/85 g) / 2.4 g Avoid: Chicken, with skin, dark meat (3 oz/85 g) / 4 g Choose: Chicken, skin removed, dark meat (3 oz/85 g) / 2  g Avoid: Chicken, with skin, light meat (3 oz/85 g) / 2.5 g Choose: Chicken, skin removed, light meat (3 oz/85 g) / 1 g Dairy / Saturated Fat (g) Avoid: Whole milk (1 cup) / 5 g Choose: Low-fat milk, 2% (1 cup) / 3 g Choose: Low-fat milk, 1% (1 cup) / 1.5 g Choose: Skim milk (1 cup) / 0.3 g Avoid: Hard cheese (1 oz/28 g) / 6 g Choose: Skim milk cheese (1 oz/28 g) / 2 to 3 g Avoid: Cottage cheese, 4% fat (1 cup) / 6.5 g Choose: Low-fat cottage cheese, 1% fat (1 cup) / 1.5 g Avoid: Ice cream (1 cup) / 9 g Choose: Sherbet (1 cup) / 2.5 g Choose: Nonfat frozen yogurt (1 cup) / 0.3 g Choose: Frozen fruit bar / trace Avoid: Whipped cream (1 tbs) / 3.5 g Choose: Nondairy whipped topping (1 tbs) / 1 g Condiments / Saturated Fat (g) Avoid: Mayonnaise (1 tbs) / 2 g Choose: Low-fat mayonnaise (1 tbs) / 1 g Avoid: Butter (1 tbs) / 7 g Choose: Extra light margarine (1 tbs) / 1 g Avoid: Coconut oil (1 tbs) / 11.8 g Choose: Olive oil (1 tbs) / 1.8 g Choose: Corn oil (1 tbs) / 1.7 g Choose: Safflower oil (1 tbs) / 1.2 g Choose: Sunflower oil (1 tbs) / 1.4  g Choose: Soybean oil (1 tbs) / 2.4 g Choose: Canola oil (1 tbs) / 1 g Document Released: 05/13/2005 Document Revised: 08/05/2011 Document Reviewed: 11/01/2010 [facility] Patient Information 2014 [place], [place]. Cholesterol Cholesterol is a white, waxy, fat-like protein needed by your body in small amounts. The liver makes all the cholesterol you need. It is carried from the liver by the blood through the blood vessels. Deposits (plaque) may build up on blood vessel walls. This makes the arteries narrower and stiffer. Plaque increases the risk for heart attack and stroke. You cannot feel your cholesterol level even if it is very high. The only way to know is by a blood test to check your lipid (fats) levels. Once you know your cholesterol levels, you should keep a record of the test results. Work with your caregiver to to keep your levels in the  desired range. WHAT THE RESULTS MEAN:  Total cholesterol is a rough measure of all the cholesterol in your blood.  LDL is the so-called bad cholesterol. This is the type that deposits cholesterol in the walls of the arteries. You want this level to be low.  HDL is the good cholesterol because it cleans the arteries and carries the LDL away. You want this level to be high.  Triglycerides are fat that the body can either burn for energy or store. High levels are closely linked to heart disease. DESIRED LEVELS:  Total cholesterol below 200.  LDL below 100 for people at risk, below 70 for very high risk.  HDL above 50 is good, above 60 is best.  Triglycerides below 150. HOW TO LOWER YOUR CHOLESTEROL:  Diet.  Choose fish or white meat chicken and [place], roasted or baked. Limit fatty cuts of red meat, fried foods, and processed meats, such as sausage and lunch meat.  Eat lots of fresh fruits and vegetables. Choose whole grains, beans, pasta, potatoes and cereals.  Use only small amounts of olive, corn or canola oils. Avoid butter, mayonnaise, shortening or palm kernel oils. Avoid foods with trans-fats.  Use skim/nonfat milk and low-fat/nonfat yogurt and cheeses. Avoid whole milk, cream, ice cream, egg yolks and cheeses. Healthy desserts include angel food cake, ginger snaps, animal crackers, hard candy, popsicles, and low-fat/nonfat frozen yogurt. Avoid pastries, cakes, pies and cookies.  Exercise.  A regular program helps decrease LDL and raises HDL.  Helps with weight control.  Do things that increase your activity level like gardening, walking, or taking the stairs.  Medication.  May be prescribed by your caregiver to help lowering cholesterol and the risk for heart disease.  You may need medicine even if your levels are normal if you have several risk factors. HOME CARE INSTRUCTIONS   Follow your diet and exercise programs as suggested by your caregiver.  Take  medications as directed.  Have blood work done when your caregiver feels it is necessary. MAKE SURE YOU:   Understand these instructions.  Will watch your condition.  Will get help right away if you are not doing well or get worse. Document Released: 02/05/2001 Document Revised: 08/05/2011 Document Reviewed: 07/29/2007 [facility] Patient Information 2014 [place], [place].

## 2013-01-12 NOTE — Progress Notes (Signed)
  Subjective:    Patient ID: Carmen Duncan, female    DOB: Jun 10, 1940, 72 y.o.   MRN: [id number]  HPI Patient is seen for medical followup. She has history of hypertension, hyperlipidemia, GERD, essential tremor  Recently was changed from Mysoline to Topamax for her tremor per neurology. She is currently on low-dose Topamax and is complaining of some insomnia. She to discuss with neurology.  We recently switched her from Crestor to Lipitor because of cost issues. She is tolerating well no side effects. She does not have history of CAD or peripheral vascular disease. Her lipids were at goal last February  Hypertension treated with lisinopril HCTZ. She also remains on low-dose propranolol which she was started on for her central tremor. No headaches. No dizziness. No peripheral edema issues. She has history of normal kidney function  Past Medical History  Diagnosis Date  . Arthritis   . Asthma   . Hypertension   . GERD (gastroesophageal reflux disease)   . Hyperlipidemia   . Diverticulosis   . IBS (irritable bowel syndrome)   . HOH (hard of hearing) left ear  . Acoustic neuroma    Past Surgical History  Procedure Laterality Date  . Cholecystectomy  2011  . Abdominal hysterectomy  1980  . Tonsillectomy  1945  . Biopsy breast    . Eyes  2007    cararacts    reports that she has never smoked. She has never used smokeless tobacco. She reports that  drinks alcohol. She reports that she does not use illicit drugs. family history includes Heart disease (age of onset: 44) in her father; Hyperlipidemia in her father and mother; Hypertension in her mother; Stroke in her father. Allergies  Allergen Reactions  . Latex     Red angry skin from latex tape  . Zocor [Simvastatin]     nausea  . Sulfonamide Derivatives Rash    hives      Review of Systems  Constitutional: Positive for fatigue. Negative for appetite change and unexpected weight change.  Eyes: Negative for visual disturbance.   Respiratory: Negative for cough, chest tightness, shortness of breath and wheezing.   Cardiovascular: Negative for chest pain, palpitations and leg swelling.  Gastrointestinal: Negative for abdominal pain.  Endocrine: Negative for polydipsia and polyuria.  Neurological: Negative for dizziness, seizures, syncope, weakness, light-headedness and headaches.       Objective:   Physical Exam  Constitutional: She appears well-developed and well-nourished.  HENT:  Mouth/Throat: Oropharynx is clear and moist.  Neck: Neck supple. No thyromegaly present.  Cardiovascular: Normal rate and regular rhythm.  Exam reveals no gallop.   No murmur heard. Pulmonary/Chest: Effort normal and breath sounds normal. No respiratory distress. She has no wheezes. She has no rales.  Musculoskeletal: She exhibits no edema.  Lymphadenopathy:    She has no cervical adenopathy.          Assessment & Plan:  #1 hypertension. Well controlled. Continue current medications #2 hyperlipidemia. Recent change of medication to Lipitor. Check lipid and hepatic panel #3 essential tremor with recent medication change as above. Followed by neurology. #4 health maintenance. Pneumovax up-to-date. Reminder for flu vaccine this fall

## 2013-03-08 ENCOUNTER — Ambulatory Visit (INDEPENDENT_AMBULATORY_CARE_PROVIDER_SITE_OTHER): Payer: Medicare Other | Admitting: Family Medicine

## 2013-03-08 DIAGNOSIS — Z23 Encounter for immunization: Secondary | ICD-10-CM

## 2013-03-11 ENCOUNTER — Ambulatory Visit: Payer: Medicare Other | Admitting: Neurology

## 2013-03-12 ENCOUNTER — Ambulatory Visit (INDEPENDENT_AMBULATORY_CARE_PROVIDER_SITE_OTHER): Payer: Medicare Other | Admitting: Neurology

## 2013-03-12 ENCOUNTER — Encounter: Payer: Self-pay | Admitting: Neurology

## 2013-03-12 VITALS — BP 146/72 | HR 58 | Temp 97.6°F | Resp 16 | Ht 61.0 in | Wt 147.4 lb

## 2013-03-12 DIAGNOSIS — Z5181 Encounter for therapeutic drug level monitoring: Secondary | ICD-10-CM

## 2013-03-12 DIAGNOSIS — G250 Essential tremor: Secondary | ICD-10-CM | POA: Insufficient documentation

## 2013-03-12 DIAGNOSIS — G609 Hereditary and idiopathic neuropathy, unspecified: Secondary | ICD-10-CM

## 2013-03-12 DIAGNOSIS — K5900 Constipation, unspecified: Secondary | ICD-10-CM

## 2013-03-12 LAB — CBC WITH DIFFERENTIAL/PLATELET
Basophils Relative: 0.9 % (ref 0.0–3.0)
Eosinophils Absolute: 0.2 10*3/uL (ref 0.0–0.7)
HCT: 40.6 % (ref 36.0–46.0)
Lymphocytes Relative: 25.6 % (ref 12.0–46.0)
Monocytes Relative: 8 % (ref 3.0–12.0)
Neutro Abs: 3.4 10*3/uL (ref 1.4–7.7)
Neutrophils Relative %: 61.5 % (ref 43.0–77.0)

## 2013-03-12 LAB — COMPREHENSIVE METABOLIC PANEL
ALT: 20 U/L (ref 0–35)
AST: 19 U/L (ref 0–37)
Albumin: 4.2 g/dL (ref 3.5–5.2)
BUN: 16 mg/dL (ref 6–23)
CO2: 28 mEq/L (ref 19–32)
Calcium: 9.5 mg/dL (ref 8.4–10.5)
Chloride: 99 mEq/L (ref 96–112)
Creatinine, Ser: 0.9 mg/dL (ref 0.4–1.2)
GFR: 67.53 mL/min (ref >60.00)
Potassium: 4.2 mEq/L (ref 3.5–5.1)

## 2013-03-12 MED ORDER — TOPIRAMATE 50 MG PO TABS: 50.0000 mg | ORAL_TABLET | Freq: Two times a day (BID) | ORAL | Status: DC

## 2013-03-12 NOTE — Progress Notes (Signed)
Subjective:   Carmen Duncan was seen in consultation in the movement disorder clinic at the request of [name], MD.  The evaluation is for tremor.  The patient is a 72 y.o. R handed female with a long hx of tremor.   The patient states that the tremor is in the L hand.  She notes it with activation.  If pouring something from a saucepan, it will start to tremor.  It seems to be worse in specific positions.  She has no trouble with putting on makeup or eating as this only affects her nondominant hand.  She has no trouble cutting food.  She has no vocal tremor, head tremor or RUE tremor.  She has no leg tremor.  Sometimes she feels like she is "walking like a drunk" because she is not stable.  She does not feel comfortable near the edge of a pier b/c she feels like she would fall off the edge.  She generally does not fall.  She did fall Jul 16, 2011 because she missed the last 3 steps at her home when she was coming down in a hurry.  She bruised her entire R leg and it is still very sore.    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  I do not have those records.  She saw [name].  She is on propranolol and she has been on it 7-8 years.  She is on 40 mg twice per day.  It does help.  She states that she was told that her propranolol could not be increased.  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  She sees a Midwife at [facility] and was told it was nothing to worry about.  She has it monitored q 3 years.  Her last MRI brain was done in [place].   Because of this, she will have intermittent numbness of the L face.    There was a fam hx of tremor in her sister and it is starting in her son, who is 79 years old.  Current/Previously tried tremor medications: propranolol  Current medications that may exacerbate tremor:  N/a  12/03/12:  She was started on primidone last visit.  She is only taking 1/2 of a 50 mg  tablet at night.  She didn't go up to a full tablet because she did not like the way that it made her feel.  She wants to d/c the medication.  She is on propranolol but the dose cannot be increased because her pulse is already on the low end of normal.  She is having some intermittent paresthesias in her fingertips, right more than left.  03/12/13:  Topamax was added last visit, which has helped but has decreased appetite.  She tremors much less now but notices it when stressed, fatigued or under pressure.  She c/o constipation with the topamax.  She has taste aversion with the medication.  She has some paresthesias with the medication.  She remains on propranolol but is off of the primidone.  She thinks that the SE are worth continuing to take the medication.  She has had worsening of vision but thinks that she had that prior to the topamax and has an eye appt Nov 4.  She worked on last visit.  B12 was reported.  Serum protein electrophoresis did not reveal M spike.  RPR was negative.  Urinary protein electrophoresis showed some  free kappa light chains but there was no monoclonal protein, no M spike.  RPR was negative.  I reviewed her MRI of the brain from hands.  This revealed moderate white matter disease.  There was evidence of a known acoustic neuroma.  Outside reports reviewed: historical medical records.  Allergies  Allergen Reactions  . Latex     Red angry skin from latex tape  . Zocor [Simvastatin]     nausea  . Sulfonamide Derivatives Rash    hives    Current Outpatient Prescriptions on File Prior to Visit  Medication Sig Dispense Refill  . aspirin 81 MG tablet Take 81 mg by mouth daily.        [name] atorvastatin (LIPITOR) 20 MG tablet Take 20 mg by mouth daily.      . cetirizine (ZYRTEC) 10 MG tablet Take 10 mg by mouth daily.      . clidinium-chlordiazePOXIDE (LIBRAX) 2.5-5 MG per capsule Take 1 capsule by mouth 2 (two) times daily as needed. Pt only taking prn due to cost      .  lisinopril-hydrochlorothiazide (PRINZIDE,ZESTORETIC) 20-12.5 MG per tablet Take 1 tablet by mouth daily.  90 tablet  3  . omeprazole (PRILOSEC OTC) 20 MG tablet Take 20 mg by mouth. 2 times daily per Dr Randa Evens      . Probiotic Product (ALIGN) 4 MG CAPS Take by mouth daily.        . propranolol (INDERAL) 40 MG tablet Take 1 tablet (40 mg total) by mouth 2 (two) times daily.  180 tablet  3   No current facility-administered medications on file prior to visit.    Past Medical History  Diagnosis Date  . Arthritis   . Asthma   . Hypertension   . GERD (gastroesophageal reflux disease)   . Hyperlipidemia   . Diverticulosis   . IBS (irritable bowel syndrome)   . HOH (hard of hearing) left ear  . Acoustic neuroma     Past Surgical History  Procedure Laterality Date  . Cholecystectomy  2011  . Abdominal hysterectomy  1980  . Tonsillectomy  1945  . Biopsy breast    . Eyes  2007    cararacts    History   Social History  . Marital Status: Widowed    Spouse Name: N/A    Number of Children: N/A  . Years of Education: N/A   Occupational History  . Not on file.   Social History Main Topics  . Smoking status: Never Smoker   . Smokeless tobacco: Never Used  . Alcohol Use: Yes     Comment: wine about 3 times a week  . Drug Use: No  . Sexual Activity: Yes    Partners: Female   Other Topics Concern  . Not on file   Social History Narrative  . No narrative on file    Family Status  Relation Status Death Age  . Mother Deceased     acute leukemia  . Father Deceased     MI, CVA  . Sister Alive     tremor  . Child Alive     son, mild tremor    Review of Systems A complete 10 system ROS was obtained and was negative apart from what is mentioned.   Objective:   VITALS:   Filed Vitals:   03/12/13 0910  BP: 146/72  Pulse: 58  Temp: 97.6 F (36.4 C)  Resp: 16  Height: 5\' 1"  (1.549 m)  Weight: 147 lb 6.4  oz (66.86 kg)   Wt Readings from Last 3 Encounters:   03/12/13 147 lb 6.4 oz (66.86 kg)  01/12/13 150 lb (68.04 kg)  12/03/12 149 lb (67.586 kg)    Gen:  Appears stated age and in NAD. HEENT:  Normocephalic, atraumatic. The mucous membranes are moist. The superficial temporal arteries are without ropiness or tenderness. Cardiovascular: Regular rate and rhythm. Lungs: Clear to auscultation bilaterally. Neck: There are no carotid bruits noted bilaterally.  NEUROLOGICAL:  Orientation:  The patient is alert and oriented x 3.  Recent and remote memory are intact.  Attention span and concentration are normal.  Able to name objects and repeat without trouble.  Fund of knowledge is appropriate Cranial nerves: There is good facial symmetry.  There is pseudoptosis from lid lag bilaterally.   The pupils are equal round and reactive to light bilaterally. Fundoscopic exam reveals clear disc margins bilaterally. Extraocular muscles are intact and visual fields are full to confrontational testing. Speech is fluent and clear. Soft palate rises symmetrically and there is no tongue deviation. Hearing is intact to conversational tone. Tone: Tone is good throughout.  There is no rigidity. Sensation: Sensation is intact to light touch and pinprick throughout (facial, trunk, extremities). Vibration is decreased at the bilateral big toe. There is no extinction with double simultaneous stimulation. There is no sensory dermatomal level identified. Coordination:  The patient has no dysdiadichokinesia or dysmetria. Motor: Strength is 5/5 in the bilateral upper and lower extremities.  Shoulder shrug is equal bilaterally.  There is no pronator drift.  There are no fasciculations noted. DTR's: Deep tendon reflexes are 1/4 at the bilateral biceps, triceps, brachioradialis, left patella (did not test at the right patella because of pain) and absent at the bilateral achilles.  Plantar responses are downgoing bilaterally. Gait and Station: The patient is able to ambulate without  difficulty. She has some difficulty ambulating in a tandem fashion.  She sways in the Romberg position, but was able to stay in place with eyes closed.  MOVEMENT EXAM: Tremor:  There is minimal tremor of L hand, most obvious when trying to draw archimedes spirals.   She is able to pour water from one glass to another without a problem.   LABS  Lab Results  Component Value Date   WBC 9.2 11/09/2009   HGB 13.5 11/09/2009   HCT 39.0 11/09/2009   MCV 93.4 11/09/2009   PLT 260 11/09/2009     Chemistry      Component Value Date/Time   NA 134* 07/14/2012 0904   K 4.1 07/14/2012 0904   CL 97 07/14/2012 0904   CO2 30 07/14/2012 0904   BUN 15 07/14/2012 0904   CREATININE 0.8 07/14/2012 0904      Component Value Date/Time   CALCIUM 9.7 07/14/2012 0904   ALKPHOS 68 01/12/2013 0913   AST 24 01/12/2013 0913   ALT 20 01/12/2013 0913   BILITOT 0.6 01/12/2013 0913     Lab Results  Component Value Date   TSH 1.49 10/02/2010      Assessment/Plan:   1.  Essential Tremor.  -she will continue on the propranolol, 40 mg twice a day.  We cannot increase the dosage because of her car pulse in the 60s.   Risks, benefits, side effects and alternative therapies were discussed.  There is no history of nephrolithiasis.  There is no history of acute angle closure glaucoma. The opportunity to ask questions was given and they were answered to the best of  my ability.  The patient expressed understanding and willingness to follow the outlined treatment protocols.  -We talked about the topamax.  I believe that the paresthesias, taste eversion and weight loss related to the Topamax.  I am not sure that the vision changes are related to the Topamax.  Topamax can cause acute angle closure glaucoma, but this is not what she describes.  She does have an appointment in about 2 weeks with her optometrist and I asked her to have them send [place] records ([facility]).  -Lab work was drawn today. 2.  Mild gait instability.  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  This seems overall very mild.  We talked about the diagnosis.  -She will need a repeat UPEP in the future.  -We talked about safety associated with peripheral neuropathy. 3.  Acoustic neuroma.  -This is being followed by ENT and neurosurgery at [facility]. 4.  Constipation.  -I. gave her a copy of the Rancho recipe. 4.  Return in about 16 weeks (around 07/02/2013). 5.  Time in the room with the patient: 25 min

## 2013-03-12 NOTE — Patient Instructions (Signed)
Constipation:  1.Rancho recipe:  -1 cup of bran, 2 cups of applesauce in 1 cup of prune juice 2.  Increase fiber intake (Metamucil,vegetables) 3.  Regular, moderate exercise can be beneficial. 4.  Avoid medications causing constipation, such as medications like antacids with calcium or magnesium 5.  Laxative overuse should be avoided. 6.  Stool softeners (Colace) can help with chronic constipation.

## 2013-03-25 ENCOUNTER — Telehealth: Payer: Self-pay

## 2013-03-25 NOTE — Telephone Encounter (Signed)
Pt called and said she is feeling really bad taking the Topamax twice a day, nauseous/weak and is wondering if this is normal or what Dr.Tat thinks she should do to feel better.

## 2013-03-26 NOTE — Telephone Encounter (Signed)
That would be odd since we did not change the overall dosage.  Taking it with food?  Does she want to go back to the once per day (both dosages at night)?

## 2013-03-26 NOTE — Telephone Encounter (Signed)
I called pt and relayed your message. She has been taking the morning dose with food but likes the idea of taking the full dosage at night instead of twice a day. She will start that today and call us back next week to let us know how she is feeling.

## 2013-04-06 ENCOUNTER — Other Ambulatory Visit: Payer: Self-pay

## 2013-04-06 DIAGNOSIS — I10 Essential (primary) hypertension: Secondary | ICD-10-CM

## 2013-04-06 MED ORDER — PROPRANOLOL HCL 40 MG PO TABS: 40.0000 mg | ORAL_TABLET | Freq: Two times a day (BID) | ORAL | Status: DC

## 2013-05-31 LAB — HM MAMMOGRAPHY: HM MAMMO: NEGATIVE

## 2013-06-03 ENCOUNTER — Encounter: Payer: Self-pay | Admitting: Family Medicine

## 2013-07-13 ENCOUNTER — Ambulatory Visit: Payer: Medicare Other | Admitting: Neurology

## 2013-07-15 ENCOUNTER — Ambulatory Visit: Payer: Medicare Other | Admitting: Family Medicine

## 2013-07-20 ENCOUNTER — Telehealth: Payer: Self-pay | Admitting: Neurology

## 2013-07-20 NOTE — Telephone Encounter (Signed)
Patient states she thinks she missed a does of her Topamax she is not sure she is wondering if this will cause any problems as she does not think it is a good thing to take one at 340pm and then again at bedtime .Please advise

## 2013-07-20 NOTE — Telephone Encounter (Signed)
Patient has some questions about medication topamax she cant remember if she took it last night and is unsure of what to do today please call her at [phone]

## 2013-07-21 NOTE — Telephone Encounter (Signed)
Spoke with patient and made her aware to continue her dosage of Topamax as normal. She has an appt scheduled for tomorrow she is wanted to r/s due to weather. Appt r/s for next week.

## 2013-07-21 NOTE — Telephone Encounter (Signed)
It won't cause any problems.  Just resume as normal

## 2013-07-22 ENCOUNTER — Ambulatory Visit: Payer: Medicare Other | Admitting: Neurology

## 2013-07-23 ENCOUNTER — Ambulatory Visit (INDEPENDENT_AMBULATORY_CARE_PROVIDER_SITE_OTHER): Payer: Medicare Other | Admitting: Family Medicine

## 2013-07-23 ENCOUNTER — Encounter: Payer: Self-pay | Admitting: Family Medicine

## 2013-07-23 VITALS — BP 130/68 | HR 64 | Wt 145.0 lb

## 2013-07-23 DIAGNOSIS — I10 Essential (primary) hypertension: Secondary | ICD-10-CM

## 2013-07-23 DIAGNOSIS — K589 Irritable bowel syndrome without diarrhea: Secondary | ICD-10-CM

## 2013-07-23 NOTE — Progress Notes (Signed)
Pre visit review using our clinic review tool, if applicable. No additional management support is needed unless otherwise documented below in the visit note. 

## 2013-07-23 NOTE — Progress Notes (Signed)
   Subjective:    Patient ID: Carmen Duncan, female    DOB: 1941-02-15, 73 y.o.   MRN: [id number]  HPI Medical followup. She has history of IBS, hypertension, hyperlipidemia, GERD. Medications reviewed. She has essential tremor which has been stable. She remains on propranolol. Blood pressure stable. No dizziness. No headaches. No chest pains. No recent falls. She has hyperlipidemia and recheck lipids last fall and these were stable on Lipitor. She denies any myalgias.  Past Medical History  Diagnosis Date  . Arthritis   . Asthma   . Hypertension   . GERD (gastroesophageal reflux disease)   . Hyperlipidemia   . Diverticulosis   . IBS (irritable bowel syndrome)   . HOH (hard of hearing) left ear  . Acoustic neuroma    Past Surgical History  Procedure Laterality Date  . Cholecystectomy  2011  . Abdominal hysterectomy  1980  . Tonsillectomy  1945  . Biopsy breast    . Eyes  2007    cararacts    reports that she has never smoked. She has never used smokeless tobacco. She reports that she drinks alcohol. She reports that she does not use illicit drugs. family history includes Heart disease (age of onset: 13) in her father; Hyperlipidemia in her father and mother; Hypertension in her mother; Stroke in her father. Allergies  Allergen Reactions  . Latex     Red angry skin from latex tape  . Zocor [Simvastatin]     nausea  . Sulfonamide Derivatives Rash    hives      Review of Systems  Constitutional: Negative for fatigue.  Eyes: Negative for visual disturbance.  Respiratory: Negative for cough, chest tightness, shortness of breath and wheezing.   Cardiovascular: Negative for chest pain, palpitations and leg swelling.  Endocrine: Negative for polydipsia and polyuria.  Neurological: Negative for dizziness, seizures, syncope, weakness, light-headedness and headaches.       Objective:   Physical Exam  Constitutional: She appears well-developed and well-nourished.  Neck: Neck  supple. No thyromegaly present.  Cardiovascular: Normal rate and regular rhythm.   Pulmonary/Chest: Effort normal and breath sounds normal. No respiratory distress. She has no wheezes. She has no rales.  Musculoskeletal: She exhibits no edema.  Lymphadenopathy:    She has no cervical adenopathy.          Assessment & Plan:  #1 hypertension. Stable at goal. Continue current medications  #2 hyperlipidemia. Lipids were stable when checked last fall. Continue Lipitor  #3 history of IBS. Patient is requesting that we take over prescribing her Librax. Her gastroenterologist did not recommend any further colonoscopies. Symptoms currently stable

## 2013-07-26 ENCOUNTER — Telehealth: Payer: Self-pay | Admitting: Family Medicine

## 2013-07-26 NOTE — Telephone Encounter (Signed)
Relevant patient education mailed to patient.  

## 2013-07-28 ENCOUNTER — Ambulatory Visit (INDEPENDENT_AMBULATORY_CARE_PROVIDER_SITE_OTHER): Payer: Medicare Other | Admitting: Neurology

## 2013-07-28 ENCOUNTER — Encounter: Payer: Self-pay | Admitting: Neurology

## 2013-07-28 VITALS — BP 106/60 | HR 64 | Resp 14 | Ht 61.0 in | Wt 143.4 lb

## 2013-07-28 DIAGNOSIS — D333 Benign neoplasm of cranial nerves: Secondary | ICD-10-CM

## 2013-07-28 DIAGNOSIS — G252 Other specified forms of tremor: Secondary | ICD-10-CM

## 2013-07-28 DIAGNOSIS — G609 Hereditary and idiopathic neuropathy, unspecified: Secondary | ICD-10-CM

## 2013-07-28 DIAGNOSIS — G250 Essential tremor: Secondary | ICD-10-CM

## 2013-07-28 DIAGNOSIS — G629 Polyneuropathy, unspecified: Secondary | ICD-10-CM

## 2013-07-28 NOTE — Progress Notes (Signed)
Subjective:   Carmen Duncan was seen in consultation in the movement disorder clinic at the request of [name], MD.  The evaluation is for tremor.  The patient is a 73 y.o. R handed female with a long hx of tremor.   The patient states that the tremor is in the L hand.  She notes it with activation.  If pouring something from a saucepan, it will start to tremor.  It seems to be worse in specific positions.  She has no trouble with putting on makeup or eating as this only affects her nondominant hand.  She has no trouble cutting food.  She has no vocal tremor, head tremor or RUE tremor.  She has no leg tremor.  Sometimes she feels like she is "walking like a drunk" because she is not stable.  She does not feel comfortable near the edge of a pier b/c she feels like she would fall off the edge.  She generally does not fall.  She did fall Jul 16, 2011 because she missed the last 3 steps at her home when she was coming down in a hurry.  She bruised her entire R leg and it is still very sore.    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  I do not have those records.  She saw Dr. [name].  She is on propranolol and she has been on it 7-8 years.  She is on 40 mg twice per day.  It does help.  She states that she was told that her propranolol could not be increased.  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  She sees a Publishing rights manager at [facility] and was told it was nothing to worry about.  She has it monitored q 3 years.  Her last MRI brain was done in [place].   Because of this, she will have intermittent numbness of the L face.    There was a fam hx of tremor in her sister and it is starting in her son, who is 38 years old.  Current/Previously tried tremor medications: propranolol, primidone  Current medications that may exacerbate tremor:  N/a  12/03/12:  She was started on primidone last visit.  She is only taking 1/2 of a  50 mg tablet at night.  She didn't go up to a full tablet because she did not like the way that it made her feel.  She wants to d/c the medication.  She is on propranolol but the dose cannot be increased because her pulse is already on the low end of normal.  She is having some intermittent paresthesias in her fingertips, right more than left.  03/12/13:  Topamax was added last visit, which has helped but has decreased appetite.  She tremors much less now but notices it when stressed, fatigued or under pressure.  She c/o constipation with the topamax.  She has taste aversion with the medication.  She has some paresthesias with the medication.  She remains on propranolol but is off of the primidone.  She thinks that the SE are worth continuing to take the medication.  She has had worsening of vision but thinks that she had that prior to the topamax and has an eye appt Nov 4.  She worked on last visit.  B12 was reported.  Serum protein electrophoresis did not reveal M spike.  RPR was negative.  Urinary protein electrophoresis showed  some free kappa light chains but there was no monoclonal protein, no M spike.  RPR was negative.  07/28/13 update:  The pt was on topamax 200 mg daily.  She was unable to tolerate bid dosing of the medication.  She ended up going back to 100 mg at night and it makes her sleepy at night when she takes the medication but no hang over effect.  She still has some intermittent paresthesias.    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  She does c/o dull fronal headaches, daily for the last month.  She uses sinus rinse and it helps.  States that tremor has been stable.    I reviewed her MRI of the brain.  This revealed moderate white matter disease.  There was evidence of a known acoustic neuroma.  Outside reports reviewed: historical medical  records.  Allergies  Allergen Reactions  . Latex     Red angry skin from latex tape  . Zocor [Simvastatin]     nausea  . Sulfonamide Derivatives Rash    hives    Current Outpatient Prescriptions on File Prior to Visit  Medication Sig Dispense Refill  . aspirin 81 MG tablet Take 81 mg by mouth daily.        [name] atorvastatin (LIPITOR) 20 MG tablet Take 20 mg by mouth daily.      . clidinium-chlordiazePOXIDE (LIBRAX) 2.5-5 MG per capsule Take 1 capsule by mouth 2 (two) times daily as needed. Pt only taking prn due to cost      . lisinopril-hydrochlorothiazide (PRINZIDE,ZESTORETIC) 20-12.5 MG per tablet Take 1 tablet by mouth daily.  90 tablet  3  . LYSINE PO Take by mouth.      [name] omeprazole (PRILOSEC OTC) 20 MG tablet Take 20 mg by mouth. 2 times daily per Dr Oletta Lamas      . Probiotic Product (ALIGN) 4 MG CAPS Take by mouth daily.        . propranolol (INDERAL) 40 MG tablet Take 1 tablet (40 mg total) by mouth 2 (two) times daily.  180 tablet  2  . topiramate (TOPAMAX) 50 MG tablet Take 1 tablet (50 mg total) by mouth 2 (two) times daily.  60 tablet  5  . cetirizine (ZYRTEC) 10 MG tablet Take 10 mg by mouth daily.       No current facility-administered medications on file prior to visit.    Past Medical History  Diagnosis Date  . Arthritis   . Asthma   . Hypertension   . GERD (gastroesophageal reflux disease)   . Hyperlipidemia   . Diverticulosis   . IBS (irritable bowel syndrome)   . HOH (hard of hearing) left ear  . Acoustic neuroma     Past Surgical History  Procedure Laterality Date  . Cholecystectomy  2011  . Abdominal hysterectomy  1980  . Tonsillectomy  1945  . Biopsy breast    . Eyes  2007    cararacts    History   Social History  . Marital Status: Widowed    Spouse Name: N/A    Number of Children: N/A  . Years of Education: N/A   Occupational History  . Not on file.   Social History Main Topics  . Smoking status: Never Smoker   . Smokeless tobacco:  Never Used  . Alcohol Use: Yes  Comment: wine about 3 times a week  . Drug Use: No  . Sexual Activity: Yes    Partners: Female   Other Topics Concern  . Not on file   Social History Narrative  . No narrative on file    Family Status  Relation Status Death Age  . Mother Deceased     acute leukemia  . Father Deceased     MI, CVA  . Sister Alive     tremor  . Child Alive     son, mild tremor    Review of Systems A complete 10 system ROS was obtained and was negative apart from what is mentioned.   Objective:   VITALS:   Filed Vitals:   07/28/13 1102  BP: 106/60  Pulse: 64  Resp: 14  Height: 5\' 1"  (1.549 m)  Weight: 143 lb 6 oz (65.034 kg)   Wt Readings from Last 3 Encounters:  07/28/13 143 lb 6 oz (65.034 kg)  07/23/13 145 lb (65.772 kg)  03/12/13 147 lb 6.4 oz (66.86 kg)    Gen:  Appears stated age and in NAD. HEENT:  Normocephalic, atraumatic. The mucous membranes are moist. The superficial temporal arteries are without ropiness or tenderness. Cardiovascular: Regular rate and rhythm. Lungs: Clear to auscultation bilaterally. Neck: There are no carotid bruits noted bilaterally.  NEUROLOGICAL:  Orientation:  The patient is alert and oriented x 3.  Recent and remote memory are intact.  Attention span and concentration are normal.  Able to name objects and repeat without trouble.  Fund of knowledge is appropriate Cranial nerves: There is good facial symmetry.  There is pseudoptosis from lid lag bilaterally.   The pupils are equal round and reactive to light bilaterally. Fundoscopic exam reveals clear disc margins bilaterally. Extraocular muscles are intact and visual fields are full to confrontational testing. Speech is fluent and clear. Soft palate rises symmetrically and there is no tongue deviation. Hearing is intact to conversational tone. Tone: Tone is good throughout.  There is no rigidity. Sensation: Sensation is intact to light touch and pinprick  throughout (facial, trunk, extremities). Vibration is decreased at the bilateral big toe. There is no extinction with double simultaneous stimulation. There is no sensory dermatomal level identified. Coordination:  The patient has no dysdiadichokinesia or dysmetria. Motor: Strength is 5/5 in the bilateral upper and lower extremities.  Shoulder shrug is equal bilaterally.  There is no pronator drift.  There are no fasciculations noted. DTR's: Deep tendon reflexes are 1/4 at the bilateral biceps, triceps, brachioradialis, left patella (did not test at the right patella because of pain) and absent at the bilateral achilles.  Plantar responses are downgoing bilaterally. Gait and Station: The patient is able to ambulate without difficulty. She has some difficulty ambulating in a tandem fashion.  She sways in the Romberg position, but was able to stay in place with eyes closed.  MOVEMENT EXAM: Tremor:  There is minimal tremor of L hand, most obvious when trying to draw archimedes spirals.   She is able to pour water from one glass to another without a problem.   LABS  Lab Results  Component Value Date   WBC 5.5 03/12/2013   HGB 13.9 03/12/2013   HCT 40.6 03/12/2013   MCV 89.4 03/12/2013   PLT 329.0 03/12/2013     Chemistry      Component Value Date/Time   NA 133* 03/12/2013 0958   K 4.2 03/12/2013 0958   CL 99 03/12/2013 0958   CO2  28 03/12/2013 0958   BUN 16 03/12/2013 0958   CREATININE 0.9 03/12/2013 0958      Component Value Date/Time   CALCIUM 9.5 03/12/2013 0958   ALKPHOS 63 03/12/2013 0958   AST 19 03/12/2013 0958   ALT 20 03/12/2013 0958   BILITOT 0.5 03/12/2013 0958     Lab Results  Component Value Date   TSH 1.46 03/12/2013      Assessment/Plan:   1.  Essential Tremor.  -she will continue on the propranolol, 40 mg twice a day.  We cannot increase the dosage because of her pulse in the 60s.   Risks, benefits, side effects and alternative therapies were discussed.   There is no history of nephrolithiasis.  There is no history of acute angle closure glaucoma. The opportunity to ask questions was given and they were answered to the best of my ability.  The patient expressed understanding and willingness to follow the outlined treatment protocols.  -We talked about the topamax.  She would like to continue on the medication, 100 mg nightly.   2.  Mild gait instability.  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  This seems overall very mild.  We talked about the diagnosis.  -repeat UPEP done today.   -We talked about safety associated with peripheral neuropathy. 3.  Acoustic neuroma.  -This is being followed by ENT and neurosurgery at [facility]. 4.  Return in about 6 months (around 01/28/2014).

## 2013-07-28 NOTE — Patient Instructions (Signed)
Your provider has requested that you have labwork completed today. Please go to [facility] on the first floor of this building before leaving the office today. Follow up 6 months or sooner if any problems.

## 2013-07-30 LAB — UIFE/LIGHT CHAINS/TP QN, 24-HR UR
ALBUMIN, U: DETECTED
FREE KAPPA/LAMBDA RATIO: 4.4 ratio (ref 2.04–10.37)
Free Kappa Lt Chains,Ur: 0.22 mg/dL (ref 0.14–2.42)
Free Lambda Lt Chains,Ur: 0.05 mg/dL (ref 0.02–0.67)
Total Protein, Urine: 0.8 mg/dL

## 2013-08-17 ENCOUNTER — Telehealth: Payer: Self-pay | Admitting: Neurology

## 2013-08-17 NOTE — Telephone Encounter (Signed)
Pt called requesting results for her Urine Test that was done on 07/22/2013. Please call pt.

## 2013-08-17 NOTE — Telephone Encounter (Signed)
Patient made aware urine test normal. She will call with any other questions and let us know when she needs refills of her medications.

## 2013-09-14 ENCOUNTER — Other Ambulatory Visit: Payer: Self-pay | Admitting: Neurology

## 2013-09-15 NOTE — Telephone Encounter (Signed)
Topamax refill requested. Per last office note- patient to remain on medication. Refill approved and sent to patient's pharmacy.   

## 2013-09-28 ENCOUNTER — Telehealth: Payer: Self-pay | Admitting: Family Medicine

## 2013-09-28 MED ORDER — ATORVASTATIN CALCIUM 20 MG PO TABS: 20.0000 mg | ORAL_TABLET | Freq: Every day | ORAL | Status: DC

## 2013-09-28 NOTE — Telephone Encounter (Signed)
[facility] [place], [place] is requesting 90 day re-fill on atorvastatin (LIPITOR) 20 MG tablet

## 2013-09-28 NOTE — Telephone Encounter (Signed)
Rx sent to pharmacy   

## 2013-10-07 ENCOUNTER — Ambulatory Visit (INDEPENDENT_AMBULATORY_CARE_PROVIDER_SITE_OTHER): Payer: Medicare Other | Admitting: Family Medicine

## 2013-10-07 ENCOUNTER — Encounter: Payer: Self-pay | Admitting: Family Medicine

## 2013-10-07 VITALS — BP 120/66 | HR 60 | Temp 97.5°F | Wt 143.0 lb

## 2013-10-07 DIAGNOSIS — H6120 Impacted cerumen, unspecified ear: Secondary | ICD-10-CM

## 2013-10-07 DIAGNOSIS — H918X9 Other specified hearing loss, unspecified ear: Secondary | ICD-10-CM

## 2013-10-07 NOTE — Progress Notes (Signed)
Pre visit review using our clinic review tool, if applicable. No additional management support is needed unless otherwise documented below in the visit note. 

## 2013-10-07 NOTE — Patient Instructions (Addendum)
Try OTC antihistamine like Zyrtec, Allegra, or Claritin for allergy symptoms.   Cerumen Impaction A cerumen impaction is when the wax in your ear forms a plug. This plug usually causes reduced hearing. Sometimes it also causes an earache or dizziness. Removing a cerumen impaction can be difficult and painful. The wax sticks to the ear canal. The canal is sensitive and bleeds easily. If you try to remove a heavy wax buildup with a cotton tipped swab, you may push it in further. Irrigation with water, suction, and small ear curettes may be used to clear out the wax. If the impaction is fixed to the skin in the ear canal, ear drops may be needed for a few days to loosen the wax. People who build up a lot of wax frequently can use ear wax removal products available in your local drugstore. SEEK MEDICAL CARE IF:  You develop an earache, increased hearing loss, or marked dizziness. Document Released: 06/20/2004 Document Revised: 08/05/2011 Document Reviewed: 08/10/2009 [facility] Patient Information 2014 [place], [place].

## 2013-10-07 NOTE — Progress Notes (Signed)
   Subjective:    Patient ID: Carmen Duncan, female    DOB: June 23, 1940, 73 y.o.   MRN: [id number]  HPI Comments: Patient is a 73 year old female with history of acoustic neuroma presents with complaints of right ear fullness x1 month. Associated symptoms are insidious right-sided hearing loss with sinus congestion and  bilateral frontal headaches. Patient denies fever, recent sickness, dizziness, shortness changes, rhinorrhea, sore throat, lymphadenopathy, shortness of breath, chest pain, abdominal pain.  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 Last MRI was this past April and the tumor has not grown. That physician told her the right ear was fine also and there is no nerve damage. Patient also notes recent history of dental procedure and auto-rejection of tooth. Oral surgery was performed and she now has a crown. Patient would like to know if this could be related.  Ear Fullness  There is pain in the right ear. This is a new problem. The current episode started 1 to 4 weeks ago. The problem occurs constantly. The problem has been gradually worsening. There has been no fever. Associated symptoms include headaches and hearing loss. Pertinent negatives include no abdominal pain, coughing or rhinorrhea. She has tried acetaminophen for the symptoms. The treatment provided mild relief. Her past medical history is significant for hearing loss.    Past Medical History  Diagnosis Date  . Arthritis   . Asthma   . Hypertension   . GERD (gastroesophageal reflux disease)   . Hyperlipidemia   . Diverticulosis   . IBS (irritable bowel syndrome)   . HOH (hard of hearing) left ear  . Acoustic neuroma    Past Surgical History  Procedure Laterality Date  . Cholecystectomy  2011  . Abdominal hysterectomy  1980  . Tonsillectomy  1945  . Biopsy breast    . Eyes  2007    cararacts     Review of Systems  HENT: Positive for hearing loss. Negative for rhinorrhea.     Respiratory: Negative for cough and shortness of breath.   Cardiovascular: Negative for chest pain and palpitations.  Gastrointestinal: Negative for abdominal pain.  Allergic/Immunologic: Negative for environmental allergies.  Neurological: Positive for headaches.  Hematological: Negative for adenopathy.      Objective:   Physical Exam  Constitutional: She appears well-developed and well-nourished.  HENT:  Head: Normocephalic.  Left Ear: Tympanic membrane and ear canal normal.  Nose: Nose normal. Right sinus exhibits no maxillary sinus tenderness and no frontal sinus tenderness. Left sinus exhibits no maxillary sinus tenderness and no frontal sinus tenderness.  Mouth/Throat: Uvula is midline, oropharynx is clear and moist and mucous membranes are normal.  Right TM obstructed by impacted cerumen.  Eyes: Conjunctivae are normal. Pupils are equal, round, and reactive to light.  Cardiovascular: Normal rate, normal heart sounds and intact distal pulses.   Pulmonary/Chest: Effort normal and breath sounds normal. She has no wheezes. She has no rales.  Neurological: She is alert.  Skin: Skin is warm and dry.    . Cerumen Impaction Irrigated right ear to remove cerumen. Patient given educational handout on cerumen impaction. Try OTC antihistamine like Zyrtec, Allegra, or Claritin for allergy symptoms. If hearing does not improve follow up with audiologist.  Audelia Acton, PA-S   Agree with above. Carolann Littler MD

## 2013-10-12 ENCOUNTER — Other Ambulatory Visit: Payer: Self-pay | Admitting: Neurology

## 2013-10-12 NOTE — Telephone Encounter (Signed)
Topamax refill requested. Per last office note- patient to remain on medication. Refill approved and sent to patient's pharmacy.   

## 2013-10-20 ENCOUNTER — Telehealth: Payer: Self-pay | Admitting: Family Medicine

## 2013-10-20 MED ORDER — LISINOPRIL-HYDROCHLOROTHIAZIDE 20-12.5 MG PO TABS: 1.0000 | ORAL_TABLET | Freq: Every day | ORAL | Status: DC

## 2013-10-20 NOTE — Telephone Encounter (Signed)
[facility] [place], [place] is requesting 90 day re-fill on lisinopril-hydrochlorothiazide (PRINZIDE,ZESTORETIC) 20-12.5 MG per tablet

## 2013-10-20 NOTE — Telephone Encounter (Signed)
[facility] sent to [facility]  

## 2013-10-22 ENCOUNTER — Telehealth: Payer: Self-pay | Admitting: Family Medicine

## 2013-10-22 MED ORDER — CILIDINIUM-CHLORDIAZEPOXIDE 2.5-5 MG PO CAPS: 1.0000 | ORAL_CAPSULE | Freq: Two times a day (BID) | ORAL | Status: DC | PRN

## 2013-10-22 NOTE — Telephone Encounter (Signed)
Faxed Rx to pharmacy  

## 2013-10-22 NOTE — Telephone Encounter (Addendum)
Pt needs new rx clidinium-cl-diazepaxide 2.5 mg take one capsule by mouth before meals twice daily #60 chlord/clidi 5-2.5 mg capvir call into [facility] [address]. This med was prescribe by old md.

## 2013-10-22 NOTE — Telephone Encounter (Signed)
Last visit 10/07/13 Last refill 05/28/12 #60 0 refill

## 2013-10-22 NOTE — Telephone Encounter (Signed)
Refill once 

## 2014-01-12 ENCOUNTER — Other Ambulatory Visit: Payer: Self-pay | Admitting: Neurology

## 2014-01-12 NOTE — Telephone Encounter (Signed)
Topamax refill requested. Per last office note- patient to remain on medication. Refill approved and sent to patient's pharmacy.   

## 2014-01-20 ENCOUNTER — Ambulatory Visit (INDEPENDENT_AMBULATORY_CARE_PROVIDER_SITE_OTHER): Payer: Medicare Other | Admitting: Family Medicine

## 2014-01-20 ENCOUNTER — Encounter: Payer: Self-pay | Admitting: Family Medicine

## 2014-01-20 VITALS — BP 132/70 | HR 60 | Temp 97.5°F | Wt 142.0 lb

## 2014-01-20 DIAGNOSIS — R5381 Other malaise: Secondary | ICD-10-CM

## 2014-01-20 DIAGNOSIS — Z23 Encounter for immunization: Secondary | ICD-10-CM

## 2014-01-20 DIAGNOSIS — E785 Hyperlipidemia, unspecified: Secondary | ICD-10-CM

## 2014-01-20 DIAGNOSIS — R29898 Other symptoms and signs involving the musculoskeletal system: Secondary | ICD-10-CM

## 2014-01-20 DIAGNOSIS — J3489 Other specified disorders of nose and nasal sinuses: Secondary | ICD-10-CM

## 2014-01-20 DIAGNOSIS — R5383 Other fatigue: Secondary | ICD-10-CM

## 2014-01-20 DIAGNOSIS — I10 Essential (primary) hypertension: Secondary | ICD-10-CM

## 2014-01-20 LAB — HEPATIC FUNCTION PANEL
ALBUMIN: 4 g/dL (ref 3.5–5.2)
ALK PHOS: 62 U/L (ref 39–117)
ALT: 19 U/L (ref 0–35)
AST: 23 U/L (ref 0–37)
Bilirubin, Direct: 0.1 mg/dL (ref 0.0–0.3)
TOTAL PROTEIN: 7.2 g/dL (ref 6.0–8.3)
Total Bilirubin: 0.8 mg/dL (ref 0.2–1.2)

## 2014-01-20 LAB — BASIC METABOLIC PANEL
BUN: 14 mg/dL (ref 6–23)
CALCIUM: 9.8 mg/dL (ref 8.4–10.5)
CHLORIDE: 100 meq/L (ref 96–112)
CO2: 27 meq/L (ref 19–32)
Creatinine, Ser: 0.8 mg/dL (ref 0.4–1.2)
GFR: 79.95 mL/min (ref >60.00)
Glucose, Bld: 91 mg/dL (ref 70–99)
Potassium: 5 mEq/L (ref 3.5–5.1)
SODIUM: 133 meq/L — AB (ref 135–145)

## 2014-01-20 LAB — LIPID PANEL
CHOL/HDL RATIO: 3
Cholesterol: 139 mg/dL (ref 0–200)
HDL: 50.3 mg/dL (ref >39.00)
LDL CALC: 66 mg/dL (ref 0–99)
NonHDL: 88.7
TRIGLYCERIDES: 116 mg/dL (ref 0.0–149.0)
VLDL: 23.2 mg/dL (ref 0.0–40.0)

## 2014-01-20 LAB — TSH: TSH: 0.3 u[IU]/mL — ABNORMAL LOW (ref 0.35–4.50)

## 2014-01-20 MED ORDER — MUPIROCIN 2 % EX OINT: 1.0000 "application " | TOPICAL_OINTMENT | Freq: Two times a day (BID) | CUTANEOUS | Status: DC

## 2014-01-20 NOTE — Progress Notes (Signed)
Pre visit review using our clinic review tool, if applicable. No additional management support is needed unless otherwise documented below in the visit note. 

## 2014-01-20 NOTE — Progress Notes (Signed)
Subjective:    Patient ID: Carmen Duncan, female    DOB: [birth year], 73 y.o.   MRN: [id number]  HPI Patient is seen for several issues. Her chronic problems include hypertension, hyperlipidemia, GERD, idiopathic peripheral neuropathy, IBS, familial tremor. She is followed by neurology regarding her tremor-and neuropathy. Hypertension treated with lisinopril HCTZ. Blood pressure stable. No dizziness. No chest pains.  Recently had 2 very transient episodes where she was standing and had complaints of bilateral lower extremity weakness. She was still able to stand and felt very weak (in her lower extremities). She did not have any concomitant dizziness. No numbness. No urine or stool incontinence. No associated low back pain or radiculopathy pain. Both episodes were very transient lasting only several minutes.  Patient complains of sore left naris. She's tried hydrogen peroxide which does not seem to be helping. She has frequent rhinitis symptoms.  Hyperlipidemia treated with Lipitor. No significant myalgias. She complains of excessive fatigue. She had significant labs including thyroid functions, B12 and many other labs last year with workup for neuropathy and these were all basically normal. Denies any depression symptoms. Generally sleeping well.  Past Medical History  Diagnosis Date  . Arthritis   . Asthma   . Hypertension   . GERD (gastroesophageal reflux disease)   . Hyperlipidemia   . Diverticulosis   . IBS (irritable bowel syndrome)   . HOH (hard of hearing) left ear  . Acoustic neuroma    Past Surgical History  Procedure Laterality Date  . Cholecystectomy  2011  . Abdominal hysterectomy  1980  . Tonsillectomy  1945  . Biopsy breast    . Eyes  2007    cararacts    reports that she has never smoked. She has never used smokeless tobacco. She reports that she drinks alcohol. She reports that she does not use illicit drugs. family history includes Heart disease (age of onset:  19) in her father; Hyperlipidemia in her father and mother; Hypertension in her mother; Stroke in her father. Allergies  Allergen Reactions  . Latex     Red angry skin from latex tape  . Zocor [Simvastatin]     nausea  . Sulfonamide Derivatives Rash    hives      Review of Systems  Constitutional: Positive for fatigue. Negative for appetite change and unexpected weight change.  Eyes: Negative for visual disturbance.  Respiratory: Negative for cough, chest tightness, shortness of breath and wheezing.   Cardiovascular: Negative for chest pain, palpitations and leg swelling.  Endocrine: Negative for polydipsia and polyuria.  Genitourinary: Negative for dysuria.  Neurological: Negative for dizziness, seizures, syncope, weakness, light-headedness and headaches.       Objective:   Physical Exam  Constitutional: She appears well-developed and well-nourished.  HENT:  Right Ear: External ear normal.  Left Ear: External ear normal.  Mouth/Throat: Oropharynx is clear and moist.  She has small approximately 2-2 mm area of nonspecific mild erythema left naris anterior along the floor of the nasal canal.  Cardiovascular: Normal rate and regular rhythm.   Pulmonary/Chest: Effort normal and breath sounds normal. No respiratory distress. She has no wheezes. She has no rales.  Musculoskeletal: She exhibits no edema.  Straight leg raising negative bilaterally  Neurological:  1+ reflex in the and trace to 1+ ankle bilaterally. She has full-strength lower extremities. Gait is normal          Assessment & Plan:  #1 hypertension. Stable. Check basic metabolic panel #2 hyperlipidemia. Check lipid  and hepatic panel #3 nonspecific small ulcerative area left naris. Bactroban ointment 2 times daily and be in touch if not fully healed in 2-3 weeks #4 patient reports 2 episodes of very transient bilateral lower extremity weakness without any associated numbness or pain. Nonfocal exam neurologically  this time. If she has any further episodes we'll recommend MRI lumbar spine to further assess #5 health maintenance. Flu vaccine given

## 2014-01-20 NOTE — Patient Instructions (Signed)
Use Bactroban ointment to left naris twice daily Be in touch if sore area is not fully healed in 2 weeks Please contact me promptly if you have any further episodes of lower extremity weakness

## 2014-01-21 ENCOUNTER — Other Ambulatory Visit: Payer: Self-pay | Admitting: Family Medicine

## 2014-01-21 DIAGNOSIS — R7989 Other specified abnormal findings of blood chemistry: Secondary | ICD-10-CM

## 2014-02-03 ENCOUNTER — Ambulatory Visit (INDEPENDENT_AMBULATORY_CARE_PROVIDER_SITE_OTHER): Payer: Medicare Other | Admitting: Neurology

## 2014-02-03 ENCOUNTER — Encounter: Payer: Self-pay | Admitting: Neurology

## 2014-02-03 VITALS — Ht 61.0 in | Wt 142.0 lb

## 2014-02-03 DIAGNOSIS — R531 Weakness: Secondary | ICD-10-CM

## 2014-02-03 DIAGNOSIS — R5381 Other malaise: Secondary | ICD-10-CM

## 2014-02-03 DIAGNOSIS — G609 Hereditary and idiopathic neuropathy, unspecified: Secondary | ICD-10-CM

## 2014-02-03 DIAGNOSIS — G250 Essential tremor: Secondary | ICD-10-CM

## 2014-02-03 DIAGNOSIS — G252 Other specified forms of tremor: Secondary | ICD-10-CM

## 2014-02-03 DIAGNOSIS — R5383 Other fatigue: Secondary | ICD-10-CM

## 2014-02-03 NOTE — Progress Notes (Signed)
Subjective:   Carmen Duncan was seen in consultation in the movement disorder clinic at the request of [name], MD.  The evaluation is for tremor.  The patient is a 73 y.o. R handed female with a long hx of tremor.   The patient states that the tremor is in the L hand.  She notes it with activation.  If pouring something from a saucepan, it will start to tremor.  It seems to be worse in specific positions.  She has no trouble with putting on makeup or eating as this only affects her nondominant hand.  She has no trouble cutting food.  She has no vocal tremor, head tremor or RUE tremor.  She has no leg tremor.  Sometimes she feels like she is "walking like a drunk" because she is not stable.  She does not feel comfortable near the edge of a pier b/c she feels like she would fall off the edge.  She generally does not fall.  She did fall Jul 16, 2011 because she missed the last 3 steps at her home when she was coming down in a hurry.  She bruised her entire R leg and it is still very sore.    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  I do not have those records.  She saw Dr. [name].  She is on propranolol and she has been on it 7-8 years.  She is on 40 mg twice per day.  It does help.  She states that she was told that her propranolol could not be increased.  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  She sees a Publishing rights manager at [facility] and was told it was nothing to worry about.  She has it monitored q 3 years.  Her last MRI brain was done in [place].   Because of this, she will have intermittent numbness of the L face.    There was a fam hx of tremor in her sister and it is starting in her son, who is 32 years old.  Current/Previously tried tremor medications: propranolol, primidone  Current medications that may exacerbate tremor:  N/a  12/03/12:  She was started on primidone last visit.  She is only taking 1/2 of  a 50 mg tablet at night.  She didn't go up to a full tablet because she did not like the way that it made her feel.  She wants to d/c the medication.  She is on propranolol but the dose cannot be increased because her pulse is already on the low end of normal.  She is having some intermittent paresthesias in her fingertips, right more than left.  03/12/13:  Topamax was added last visit, which has helped but has decreased appetite.  She tremors much less now but notices it when stressed, fatigued or under pressure.  She c/o constipation with the topamax.  She has taste aversion with the medication.  She has some paresthesias with the medication.  She remains on propranolol but is off of the primidone.  She thinks that the SE are worth continuing to take the medication.  She has had worsening of vision but thinks that she had that prior to the topamax and has an eye appt Nov 4.  She worked on last visit.  B12 was reported.  Serum protein electrophoresis did not reveal M spike.  RPR was negative.  Urinary protein electrophoresis  showed some free kappa light chains but there was no monoclonal protein, no M spike.  RPR was negative.  07/28/13 update:  The pt was on topamax 200 mg daily.  She was unable to tolerate bid dosing of the medication.  She ended up going back to 100 mg at night and it makes her sleepy at night when she takes the medication but no hang over effect.  She still has some intermittent paresthesias.    I received a note from 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  She does c/o dull fronal headaches, daily for the last month.  She uses sinus rinse and it helps.  States that tremor has been stable.    02/03/14 update:  The patient has a history of essential tremor, and is currently on propranolol 40 mg once a day as well as Topamax 100 mg daily.   Unless she is very tired or upset, she  does well in regards to tremor.   She received notes from [name] at [facility] since last visit.  He just said that her schwannoma was stable in size and she just needs a repeat MRI for 10 years that they will plan on doing.  She did have a repeat urinary protein electrophoresis since our last visit that was unremarkable.  I had the opportunity to review notes from her primary care physician from 01/20/2014.  There were 2 brief episodes that the patient describes in which she was walking and her legs felt weak.  She felt near syncopal.  She was not dizzy but just stated that "I couldn't have moved if I had to."    It only lasted for a few minutes.  She had already decreased the propranolol on her own when these episodes happened.  She did that primarily because she would forget to take it bid but also because of fatigue.    I reviewed her MRI of the brain.  This revealed moderate white matter disease.  There was evidence of a known acoustic neuroma.  Outside reports reviewed: historical medical records.  Allergies  Allergen Reactions  . Latex     Red angry skin from latex tape  . Zocor [Simvastatin]     nausea  . Sulfonamide Derivatives Rash    hives    Current Outpatient Prescriptions on File Prior to Visit  Medication Sig Dispense Refill  . aspirin 81 MG tablet Take 81 mg by mouth daily.        [name] atorvastatin (LIPITOR) 20 MG tablet Take 1 tablet (20 mg total) by mouth daily.  90 tablet  2  . cetirizine (ZYRTEC) 10 MG tablet Take 10 mg by mouth daily.      . clidinium-chlordiazePOXIDE (LIBRAX) 5-2.5 MG per capsule Take 1 capsule by mouth 2 (two) times daily as needed. Pt only taking prn due to cost  60 capsule  0  . lisinopril-hydrochlorothiazide (PRINZIDE,ZESTORETIC) 20-12.5 MG per tablet Take 1 tablet by mouth daily.  90 tablet  3  . mupirocin ointment (BACTROBAN) 2 % Place 1 application into the nose 2 (two) times daily.  22 g  0  . omeprazole (PRILOSEC OTC) 20 MG tablet Take 20 mg by  mouth. 2 times daily per Dr Oletta Lamas      . Probiotic Product (ALIGN) 4 MG CAPS Take by mouth daily.        [name] topiramate (TOPAMAX) 50  MG tablet Take 2 tablets (100 mg total) by mouth at bedtime.  60 tablet  5  . UNABLE TO FIND Milta Deiters med sinus rinse       No current facility-administered medications on file prior to visit.    Past Medical History  Diagnosis Date  . Arthritis   . Asthma   . Hypertension   . GERD (gastroesophageal reflux disease)   . Hyperlipidemia   . Diverticulosis   . IBS (irritable bowel syndrome)   . HOH (hard of hearing) left ear  . Acoustic neuroma     Past Surgical History  Procedure Laterality Date  . Cholecystectomy  2011  . Abdominal hysterectomy  1980  . Tonsillectomy  1945  . Biopsy breast    . Eyes  2007    cararacts    History   Social History  . Marital Status: Widowed    Spouse Name: N/A    Number of Children: N/A  . Years of Education: N/A   Occupational History  . Not on file.   Social History Main Topics  . Smoking status: Never Smoker   . Smokeless tobacco: Never Used  . Alcohol Use: Yes     Comment: wine about 3 times a week  . Drug Use: No  . Sexual Activity: Yes    Partners: Female   Other Topics Concern  . Not on file   Social History Narrative  . No narrative on file    Family Status  Relation Status Death Age  . Mother Deceased     acute leukemia  . Father Deceased     MI, CVA  . Sister Alive     tremor  . Child Alive     son, mild tremor    Review of Systems A complete 10 system ROS was obtained and was negative apart from what is mentioned.   Objective:   VITALS:   Filed Vitals:   02/03/14 1255  Height: 5\' 1"  (1.549 m)  Weight: 142 lb (64.411 kg)   Wt Readings from Last 3 Encounters:  02/03/14 142 lb (64.411 kg)  01/20/14 142 lb (64.411 kg)  10/07/13 143 lb (64.864 kg)   Gen:  Appears stated age and in NAD. HEENT:  Normocephalic, atraumatic. The mucous membranes are moist. The superficial  temporal arteries are without ropiness or tenderness. Cardiovascular: Regular rate and rhythm. Lungs: Clear to auscultation bilaterally. Neck: There are no carotid bruits noted bilaterally.  NEUROLOGICAL:  Orientation:  The patient is alert and oriented x 3.  Recent and remote memory are intact.  Attention span and concentration are normal.  Able to name objects and repeat without trouble.  Fund of knowledge is appropriate Cranial nerves: There is good facial symmetry.  There is pseudoptosis from lid lag bilaterally.   The pupils are equal round and reactive to light bilaterally. Fundoscopic exam reveals clear disc margins bilaterally. Extraocular muscles are intact and visual fields are full to confrontational testing. Speech is fluent and clear. Soft palate rises symmetrically and there is no tongue deviation. Hearing is intact to conversational tone. Tone: Tone is good throughout.  There is no rigidity. Sensation: Sensation is intact to light touch and pinprick throughout (facial, trunk, extremities). Vibration is decreased at the bilateral big toe. There is no extinction with double simultaneous stimulation. There is no sensory dermatomal level identified. Coordination:  The patient has no dysdiadichokinesia or dysmetria. Motor: Strength is 5/5 in the bilateral upper and lower extremities.  Shoulder shrug is equal  bilaterally.  There is no pronator drift.  There are no fasciculations noted. DTR's: Deep tendon reflexes are 1/4 at the bilateral biceps, triceps, brachioradialis, left patella (did not test at the right patella because of pain) and absent at the bilateral achilles.  Plantar responses are downgoing bilaterally. Gait and Station: The patient is able to ambulate without difficulty. She has some difficulty ambulating in a tandem fashion.  She sways in the Romberg position, but was able to stay in place with eyes closed.  MOVEMENT EXAM: Tremor:  There is minimal tremor of L hand, most  obvious when trying to draw archimedes spirals.   She is able to pour water from one glass to another without a problem.   LABS  Lab Results  Component Value Date   WBC 5.5 03/12/2013   HGB 13.9 03/12/2013   HCT 40.6 03/12/2013   MCV 89.4 03/12/2013   PLT 329.0 03/12/2013     Chemistry      Component Value Date/Time   NA 133* 01/20/2014 1348   K 5.0 01/20/2014 1348   CL 100 01/20/2014 1348   CO2 27 01/20/2014 1348   BUN 14 01/20/2014 1348   CREATININE 0.8 01/20/2014 1348      Component Value Date/Time   CALCIUM 9.8 01/20/2014 1348   ALKPHOS 62 01/20/2014 1348   AST 23 01/20/2014 1348   ALT 19 01/20/2014 1348   BILITOT 0.8 01/20/2014 1348     Lab Results  Component Value Date   TSH 0.30* 01/20/2014      Assessment/Plan:   1.  Essential Tremor.  -she will continue on the propranolol, 40 mg once a day.    Risks, benefits, side effects and alternative therapies were discussed.  The opportunity to ask questions was given and they were answered to the best of my ability.  The patient expressed understanding and willingness to follow the outlined treatment protocols.  -We talked about the topamax.  She would like to continue on the medication, 100 mg nightly.   There is no history of nephrolithiasis.  There is no history of acute angle closure glaucoma. 2.  Mild gait instability.  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  This seems overall very mild.  We talked about the diagnosis.  -w/u for reversible causes was negative  -We talked about safety associated with peripheral neuropathy. 3.  Acoustic neuroma.  -This is being followed by ENT and neurosurgery at [facility].  Recent notes in 2015 said that she does not need an MRI for another 10 years. 4.  Discrete episodes of generalized weakness.  -I wonder if these are orthostatic in nature but she was not orthostatic today.  She is down to once daily dosing of the propranolol.  I think that cardiac arrhythmia is also  in the differential.  I told her if she has another event, I asked her to take her blood pressure immediately with a cuff that she has at home.  Perhaps utilizing an event monitor would be of value if she has further events.  I will certainly leave this to the discretion of her primary care physician, but I also can take her off of the propranolol, if needed, as tremor is fairly mild. 5.  Return in about 6 months (around 08/04/2014).

## 2014-02-07 ENCOUNTER — Telehealth: Payer: Self-pay | Admitting: Family Medicine

## 2014-02-07 DIAGNOSIS — R29898 Other symptoms and signs involving the musculoskeletal system: Secondary | ICD-10-CM

## 2014-02-07 NOTE — Telephone Encounter (Signed)
Pt is having weakness. Waist below another episode happening this morning in the legs. No pain no dizzy.

## 2014-02-07 NOTE — Telephone Encounter (Signed)
Pt was seen in aug and now requesting a MRI due to from waist down pt is experiencing weakness no pain nor weakness.

## 2014-02-07 NOTE — Telephone Encounter (Signed)
Please clarify this note-"requesting a MRI due to from waist down pt is experiencing weakness no pain nor weakness." Is she having weakness or no weakness?

## 2014-02-11 ENCOUNTER — Ambulatory Visit
Admission: RE | Admit: 2014-02-11 | Discharge: 2014-02-11 | Disposition: A | Discharge status: Home / Self Care | Payer: Medicare Other | Source: Ambulatory Visit | Attending: Family Medicine | Admitting: Family Medicine

## 2014-02-11 DIAGNOSIS — R29898 Other symptoms and signs involving the musculoskeletal system: Secondary | ICD-10-CM

## 2014-03-15 ENCOUNTER — Telehealth: Payer: Self-pay | Admitting: Family Medicine

## 2014-03-15 NOTE — Telephone Encounter (Signed)
[facility]MAIL ORDER[facility] - [place], [place] is requesting re-fill on propranolol (INDERAL) 40 MG tablet

## 2014-03-16 MED ORDER — PROPRANOLOL HCL 40 MG PO TABS: 40.0000 mg | ORAL_TABLET | Freq: Every day | ORAL | Status: DC

## 2014-03-16 NOTE — Telephone Encounter (Signed)
Rx sent to mail order

## 2014-03-21 ENCOUNTER — Encounter: Payer: Self-pay | Admitting: Family Medicine

## 2014-03-21 ENCOUNTER — Ambulatory Visit (INDEPENDENT_AMBULATORY_CARE_PROVIDER_SITE_OTHER): Payer: Medicare Other | Admitting: Family Medicine

## 2014-03-21 VITALS — BP 130/70 | HR 84 | Temp 97.3°F | Wt 144.0 lb

## 2014-03-21 DIAGNOSIS — R0609 Other forms of dyspnea: Secondary | ICD-10-CM

## 2014-03-21 DIAGNOSIS — R0600 Dyspnea, unspecified: Secondary | ICD-10-CM

## 2014-03-21 DIAGNOSIS — I7300 Raynaud's syndrome without gangrene: Secondary | ICD-10-CM | POA: Insufficient documentation

## 2014-03-21 NOTE — Progress Notes (Signed)
Pre visit review using our clinic review tool, if applicable. No additional management support is needed unless otherwise documented below in the visit note. 

## 2014-03-21 NOTE — Patient Instructions (Signed)
We will call you with stress test. Follow up promptly for any chest pain or worsening symptoms.

## 2014-03-21 NOTE — Progress Notes (Signed)
   Subjective:    Patient ID: Carmen Duncan, female    DOB: 1941/04/18, 73 y.o.   MRN: [id number]  HPI Patient seen with chief complaint of some exertional dyspnea and weakness over the past 6 weeks or so. She denies any orthopnea. No history of CAD. She denies any chest pain whatsoever. She's had nonspecific symptoms of increased malaise. She's had occasional cough but no fevers or chills. No hemoptysis. No pleuritic pain. For example, she is becoming extremely fatigued and dyspneic when going upstairs or hills. No symptoms at rest.  Her chronic problems include history of hypertension, essential tremor, IBS, hyperlipidemia, GERD. Father had coronary disease in his 13s. Patient is nonsmoker. No history of diabetes. No recent peripheral edema issues.  She has history of Raynaud's phenomenon and has had some increased symptoms especially with recent cool weather. Her blood pressure medications include lisinopril HCTZ and propranolol. She's had some chronic malaise for quite some time. Recent TSH was slightly low and she is due for repeat lab work tomorrow. She denies any diarrhea or weight loss.  Past Medical History  Diagnosis Date  . Arthritis   . Asthma   . Hypertension   . GERD (gastroesophageal reflux disease)   . Hyperlipidemia   . Diverticulosis   . IBS (irritable bowel syndrome)   . HOH (hard of hearing) left ear  . Acoustic neuroma    Past Surgical History  Procedure Laterality Date  . Cholecystectomy  2011  . Abdominal hysterectomy  1980  . Tonsillectomy  1945  . Biopsy breast    . Eyes  2007    cararacts    reports that she has never smoked. She has never used smokeless tobacco. She reports that she drinks alcohol. She reports that she does not use illicit drugs. family history includes Heart disease (age of onset: 92) in her father; Hyperlipidemia in her father and mother; Hypertension in her mother; Stroke in her father. Allergies  Allergen Reactions  . Latex     Red  angry skin from latex tape  . Zocor [Simvastatin]     nausea  . Sulfonamide Derivatives Rash    hives      Review of Systems  Constitutional: Positive for fatigue. Negative for appetite change and unexpected weight change.  Respiratory: Positive for shortness of breath.   Cardiovascular: Negative for chest pain, palpitations and leg swelling.  Gastrointestinal: Negative for abdominal pain.  Genitourinary: Negative for dysuria.  Neurological: Positive for weakness. Negative for dizziness and syncope.       Objective:   Physical Exam  Constitutional: She is oriented to person, place, and time. She appears well-developed and well-nourished. No distress.  Neck: Neck supple. No JVD present.  Cardiovascular: Normal rate and regular rhythm.   Pulmonary/Chest: Effort normal and breath sounds normal. No respiratory distress. She has no wheezes. She has no rales.  Musculoskeletal: She exhibits no edema.  Neurological: She is alert and oriented to person, place, and time.          Assessment & Plan:  #1 exertional dyspnea. She does not have any history of lung disease. No evidence for overt heart failure-and no past history of CHF. Concern is whether this could be angina equivalent. EKG here today sinus rhythm with no acute changes. Set up nuclear stress test for further evaluation. Continue baby aspirin 1 daily #2 history of Raynaud's phenomenon. Patient may benefit from low-dose calcium channel blocker but will get further studies first as above.

## 2014-03-22 ENCOUNTER — Other Ambulatory Visit (INDEPENDENT_AMBULATORY_CARE_PROVIDER_SITE_OTHER): Payer: Medicare Other

## 2014-03-22 DIAGNOSIS — R7989 Other specified abnormal findings of blood chemistry: Secondary | ICD-10-CM

## 2014-03-22 LAB — TSH: TSH: 0.62 u[IU]/mL (ref 0.35–4.50)

## 2014-03-30 ENCOUNTER — Ambulatory Visit (HOSPITAL_COMMUNITY): Payer: Medicare Other | Attending: Family Medicine | Admitting: Radiology

## 2014-03-30 ENCOUNTER — Telehealth: Payer: Self-pay | Admitting: Family Medicine

## 2014-03-30 DIAGNOSIS — R5383 Other fatigue: Secondary | ICD-10-CM | POA: Insufficient documentation

## 2014-03-30 DIAGNOSIS — R0609 Other forms of dyspnea: Secondary | ICD-10-CM | POA: Diagnosis not present

## 2014-03-30 DIAGNOSIS — I10 Essential (primary) hypertension: Secondary | ICD-10-CM | POA: Diagnosis not present

## 2014-03-30 DIAGNOSIS — R0600 Dyspnea, unspecified: Secondary | ICD-10-CM

## 2014-03-30 MED ORDER — REGADENOSON 0.4 MG/5ML IV SOLN
0.4000 mg | Freq: Once | INTRAVENOUS | Status: AC
  Administered 2014-03-30: 0.4 mg via INTRAVENOUS

## 2014-03-30 MED ORDER — TECHNETIUM TC 99M SESTAMIBI GENERIC - CARDIOLITE
11.0000 | Freq: Once | INTRAVENOUS | Status: AC | PRN
  Administered 2014-03-30: 11 via INTRAVENOUS

## 2014-03-30 MED ORDER — TECHNETIUM TC 99M SESTAMIBI GENERIC - CARDIOLITE
33.0000 | Freq: Once | INTRAVENOUS | Status: AC | PRN
  Administered 2014-03-30: 33 via INTRAVENOUS

## 2014-03-30 NOTE — Telephone Encounter (Signed)
Usually 2-3 days

## 2014-03-30 NOTE — Progress Notes (Signed)
[facility] [address] [place], [place] [address] [phone]    Cardiology Nuclear Med Study  KADY TOOTHAKER is a 73 y.o. female     MRN : [id number]     DOB: [birth year]  Procedure Date: 03/30/2014  Nuclear Med Background Indication for Stress Test:  Evaluation for Ischemia History:  Asthma Cardiac Risk Factors: Hypertension  Symptoms:  DOE and Fatigue   Nuclear Pre-Procedure Caffeine/Decaff Intake:  None> 12 hrs NPO After: 10:30pm   Lungs:  clear O2 Sat: 94% on room air. IV 0.9% NS with Angio Cath:  24g  IV Site: R Antecubital x 1, tolerated well IV Started by:  Irven Baltimore, RN  Chest Size (in):  38 Cup Size: C  Height: 5\' 1"  (1.549 m)  Weight:  140 lb (63.504 kg)  BMI:  Body mass index is 26.47 kg/(m^2). Tech Comments:  Patient took Inderal last night. Irven Baltimore, RN.    Nuclear Med Study 1 or 2 day study: 1 day  Stress Test Type:  Lexiscan  Reading MD: N/A  Order Authorizing Provider:  Carolann Littler, MD  Resting Radionuclide: Technetium 87m Sestamibi  Resting Radionuclide Dose: 11.0 mCi   Stress Radionuclide:  Technetium 42m Sestamibi  Stress Radionuclide Dose: 33.0 mCi           Stress Protocol Rest HR: 71 Stress HR: 95  Rest BP: 132/65 Stress BP: 143/57  Exercise Time (min): n/a METS: n/a           Dose of Adenosine (mg):  n/a Dose of Lexiscan: 0.4 mg  Dose of Atropine (mg): n/a Dose of Dobutamine: n/a mcg/kg/min (at max HR)  Stress Test Technologist: Glade Lloyd, BS-ES  Nuclear Technologist:  Earl Many, CNMT     Rest Procedure:  Myocardial perfusion imaging was performed at rest 45 minutes following the intravenous administration of Technetium 70m Sestamibi. Rest ECG: normal sinus rhythm, 71 bpm with nonspecific ST-T wave changes, poor R-wave progression.  Stress Procedure:  The patient received IV Lexiscan 0.4 mg over 15-seconds.  Technetium 73m Sestamibi injected at 30-seconds.  Quantitative spect images  were obtained after a 45 minute delay.  During the infusion of Lexiscan the patient complained of nausea, lightheadedness and headache.  These symptoms resolved in recovery.  Stress ECG: No significant change from baseline ECG  QPS Raw Data Images:  There is mild bowel loop attenuation artifact seen at stress. Stress Images:  There is mild decreased uptake along the distal lateral distribution, slightly worse at stress than at rest. A mild degree of ischemia cannot be excluded. Bowel loop attenuation artifact decreases sensitivity of results. Rest Images:  There is homogeneous radiotracer uptake seen at rest. Subtraction (SDS):  5. Mild distal lateral ischemia. Transient Ischemic Dilatation (Normal <1.22):  0.82 Lung/Heart Ratio (Normal <0.45):  0.29  Quantitative Gated Spect Images QGS EDV:  44 ml QGS ESV:  7 ml  Impression Exercise Capacity:  Lexiscan with no exercise. BP Response:  Normal blood pressure response. Clinical Symptoms:  Mild nausea, lightheadedness ECG Impression:  No significant ST segment change suggestive of ischemia. Comparison with Prior Nuclear Study: No images to compare  Overall Impression:  Low risk stress nuclear study with mild distal lateral ischemia. Overall sensitivity reduced by bowel loop attenuation..  LV Ejection Fraction: 83%.  LV Wall Motion:  NL LV Function; NL Wall Motion   Candee Furbish, MD

## 2014-03-30 NOTE — Telephone Encounter (Signed)
Pt informed

## 2014-03-30 NOTE — Telephone Encounter (Signed)
Pt wanted to let you know she just finished the stress test and would like to know how long till she can get the results??

## 2014-03-31 ENCOUNTER — Telehealth: Payer: Self-pay

## 2014-03-31 DIAGNOSIS — R0609 Other forms of dyspnea: Principal | ICD-10-CM

## 2014-03-31 DIAGNOSIS — R0600 Dyspnea, unspecified: Secondary | ICD-10-CM

## 2014-03-31 NOTE — Telephone Encounter (Signed)
Pt would like for you to give her a call after 6 today or in the morning if you can. Pt states that she still does not feel good and she does not feel like herself. [phone]

## 2014-04-03 NOTE — Telephone Encounter (Signed)
Spoke with pt . Still having some exertional dyspnea and fatigue.  Nuclear stress tests results discussed with patient. Low risk study with normal EF.  Comment of "mild distal lateral ischemia".   Will refer to cardiology .

## 2014-04-03 NOTE — Addendum Note (Signed)
Addended by: [name] on: 04/03/2014 06:32 PM   Modules accepted: Orders

## 2014-04-04 ENCOUNTER — Telehealth: Payer: Self-pay | Admitting: Family Medicine

## 2014-04-04 ENCOUNTER — Encounter: Payer: Self-pay | Admitting: Cardiology

## 2014-04-04 ENCOUNTER — Ambulatory Visit (INDEPENDENT_AMBULATORY_CARE_PROVIDER_SITE_OTHER): Payer: Medicare Other | Admitting: Cardiology

## 2014-04-04 VITALS — BP 126/60 | HR 102 | Ht 61.0 in | Wt 145.8 lb

## 2014-04-04 DIAGNOSIS — R9439 Abnormal result of other cardiovascular function study: Secondary | ICD-10-CM

## 2014-04-04 DIAGNOSIS — R0989 Other specified symptoms and signs involving the circulatory and respiratory systems: Secondary | ICD-10-CM

## 2014-04-04 DIAGNOSIS — I10 Essential (primary) hypertension: Secondary | ICD-10-CM

## 2014-04-04 DIAGNOSIS — H9190 Unspecified hearing loss, unspecified ear: Secondary | ICD-10-CM | POA: Insufficient documentation

## 2014-04-04 DIAGNOSIS — R0609 Other forms of dyspnea: Secondary | ICD-10-CM | POA: Insufficient documentation

## 2014-04-04 DIAGNOSIS — R931 Abnormal findings on diagnostic imaging of heart and coronary circulation: Secondary | ICD-10-CM

## 2014-04-04 DIAGNOSIS — R0600 Dyspnea, unspecified: Secondary | ICD-10-CM

## 2014-04-04 DIAGNOSIS — H9192 Unspecified hearing loss, left ear: Secondary | ICD-10-CM

## 2014-04-04 DIAGNOSIS — I7300 Raynaud's syndrome without gangrene: Secondary | ICD-10-CM

## 2014-04-04 MED ORDER — PROPRANOLOL HCL 40 MG PO TABS: 40.0000 mg | ORAL_TABLET | Freq: Two times a day (BID) | ORAL | Status: DC

## 2014-04-04 NOTE — Assessment & Plan Note (Signed)
The patient has basilar rales on physical exam today. I am not convinced that she is volume overloaded. She will have a PA and lateral chest x-ray for further assessment. I've chosen not to add diuretics as of today.

## 2014-04-04 NOTE — Assessment & Plan Note (Signed)
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 I've encouraged her to wear her gloves all of the time. I don't know if this is part of a systemic collagen vascular abnormality.

## 2014-04-04 NOTE — Assessment & Plan Note (Signed)
Blood pressures controlled. No change in therapy. 

## 2014-04-04 NOTE — Assessment & Plan Note (Signed)
As part of her evaluation for exertional shortness of breath, she underwent a nuclear stress study. There is question of a small area of ischemia. This is a low risk scan. There may be some interference from nuclear activity from structures below the diaphragm.  At this point I'm not convinced of severe ischemia.

## 2014-04-04 NOTE — Telephone Encounter (Signed)
Rx changed and sent to mail order.  

## 2014-04-04 NOTE — Patient Instructions (Signed)
**Note De-Identified  Obfuscation** Your physician recommends that you continue on your current medications as directed. Please refer to the Current Medication list given to you today.  A chest x-ray takes a picture of the organs and structures inside the chest, including the heart, lungs, and blood vessels. This test can show several things, including, whether the heart is enlarges; whether fluid is building up in the lungs; and whether pacemaker / defibrillator leads are still in place.  Your physician has requested that you have an echocardiogram. Echocardiography is a painless test that uses sound waves to create images of your heart. It provides your doctor with information about the size and shape of your heart and how well your heart's chambers and valves are working. This procedure takes approximately one hour. There are no restrictions for this procedure.  Your physician recommends that you schedule a follow-up appointment in: soon after Echo

## 2014-04-04 NOTE — Telephone Encounter (Signed)
Pt states her rx was sent in incorrectly to [facility]. Pt states the propranolol (INDERAL) 40 MG tablet is supposed to be 2 x /day. But was sent in for only one time/day.  [facility] told pt to have dr send in a new rx for propranolol (INDERAL) 40 MG tablet, BID, and it needs to state: "do not fill until pt calls when she is ready"

## 2014-04-04 NOTE — Progress Notes (Signed)
Patient ID: Carmen Duncan, female   DOB: 1941/04/12, 73 y.o.   MRN: [id number]    HPI  patient is seen today as a new patient cardiac evaluation. She has exertional shortness of breath. She had a nuclear scan raised the question of some ischemia. She was referred to cardiology for further evaluation.  The patient has actually had exertional shortness of breath for at least a year. However, she does feel that it is getting worse. She does not have PND or orthopnea. There has been no edema. She also has general fatigue. In addition she has Raynaud's phenomenon that appears to be getting worse by history. She does not have any significant chest pain. She does have a cough that occurs when she becomes short of breath.  The patient's thyroid function is followed carefully. At one point her TSH was borderline low. Very recent follow-up showed a TSH of 0.6.  Allergies  Allergen Reactions  . Latex     Red angry skin from latex tape  . Zocor [Simvastatin]     nausea  . Sulfonamide Derivatives Rash    hives    Current Outpatient Prescriptions  Medication Sig Dispense Refill  . aspirin 81 MG tablet Take 81 mg by mouth daily.      [name] atorvastatin (LIPITOR) 20 MG tablet Take 1 tablet (20 mg total) by mouth daily. 90 tablet 2  . cetirizine (ZYRTEC) 10 MG tablet Take 10 mg by mouth daily.    . clidinium-chlordiazePOXIDE (LIBRAX) 5-2.5 MG per capsule Take 1 capsule by mouth 2 (two) times daily as needed. Pt only taking prn due to cost 60 capsule 0  . lisinopril-hydrochlorothiazide (PRINZIDE,ZESTORETIC) 20-12.5 MG per tablet Take 1 tablet by mouth daily. 90 tablet 3  . omeprazole (PRILOSEC OTC) 20 MG tablet Take 20 mg by mouth. 2 times daily per Dr Oletta Lamas    . Probiotic Product (ALIGN) 4 MG CAPS Take by mouth daily.      . propranolol (INDERAL) 40 MG tablet Take 1 tablet (40 mg total) by mouth 2 (two) times daily. 180 tablet 2  . topiramate (TOPAMAX) 50 MG tablet Take 2 tablets (100 mg total) by mouth at  bedtime. 60 tablet 5  . UNABLE TO FIND Milta Deiters med sinus rinse     No current facility-administered medications for this visit.    History   Social History  . Marital Status: Widowed    Spouse Name: N/A    Number of Children: N/A  . Years of Education: N/A   Occupational History  . Not on file.   Social History Main Topics  . Smoking status: Never Smoker   . Smokeless tobacco: Never Used  . Alcohol Use: Yes     Comment: wine about 3 times a week  . Drug Use: No  . Sexual Activity:    Partners: Female   Other Topics Concern  . Not on file   Social History Narrative    Family History  Problem Relation Age of Onset  . Hyperlipidemia Mother   . Hypertension Mother   . Hyperlipidemia Father   . Heart disease Father 62  . Stroke Father     Past Medical History  Diagnosis Date  . Arthritis   . Asthma   . Hypertension   . GERD (gastroesophageal reflux disease)   . Hyperlipidemia   . Diverticulosis   . IBS (irritable bowel syndrome)   . HOH (hard of hearing) left ear  . Acoustic neuroma  Past Surgical History  Procedure Laterality Date  . Cholecystectomy  2011  . Abdominal hysterectomy  1980  . Tonsillectomy  1945  . Biopsy breast    . Eyes  2007    cararacts    Patient Active Problem List   Diagnosis Date Noted  . Dyspnea on exertion 04/04/2014  . HOH (hard of hearing)   . Raynaud's phenomenon 03/21/2014  . Tremor, essential 03/12/2013  . Unspecified hereditary and idiopathic peripheral neuropathy 08/04/2012  . Squamous cell skin cancer 08/19/2011  . 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 HYPERLIPIDEMIA 04/03/2010  . Essential hypertension 04/03/2010  . GERD 04/03/2010  . IBS 04/03/2010  . DEEP VENOUS THROMBOPHLEBITIS, HX OF 04/03/2010  . DIVERTICULITIS, HX OF 04/03/2010    ROS   patient denies fever, chills, headache, sweats, rash, change in vision, change in hearing, chest pain, nausea or vomiting, urinary symptoms. All other systems are reviewed  and are negative other than the history of present illness.  PHYSICAL EXAM  The patient is here with her son. He is a Software engineer by training and works with IT / EPIC at [employer]. She is oriented to person time and place. Affect is normal. She is wrapped up formally wearing her winter coat and gloves in the office. Head is atraumatic. Sclera and conjunctiva are normal. There is no jugular venous distention. She has bilateral basilar crackles. There is no respiratory distress. Cardiac exam reveals an S1 and S2. The abdomen is soft. I can't rule out the possibility of trace peripheral edema. We were not able to obtain O2 sat from her fingers. There are no musculoskeletal deformities. She has classic skin changes in her hands of Raynaud's phenomenon. In the office today her hands are red with a bluish tint.  Filed Vitals:   04/04/14 1553  BP: 126/60  Pulse: 102  Height: 5\' 1"  (1.549 m)  Weight: 145 lb 12.8 oz (66.134 kg)   I have reviewed the EKG that was done on March 21, 2014. There is no diagnostic abnormality.  ASSESSMENT & PLAN

## 2014-04-04 NOTE — Assessment & Plan Note (Signed)
Etiology of her exertional shortness of breath is not yet clear. The nuclear scan is mildly abnormal. I'm not convinced yet of significant ischemia. Chest x-ray will be done to further assess. In addition she will have a 2-dimensional echo to assess LV function and RV function. We need to rule out occult valvular disease and occult pulmonary hypertension. I will see her for follow other follow-up after the studies are done. Also I cannot be sure about her resting pulmonary status as we could not obtain an O2 sat duration. She may need a blood gas.

## 2014-04-05 ENCOUNTER — Ambulatory Visit: Payer: Medicare Other | Admitting: Cardiology

## 2014-04-06 ENCOUNTER — Encounter: Payer: Self-pay | Admitting: Family Medicine

## 2014-04-06 ENCOUNTER — Other Ambulatory Visit (HOSPITAL_COMMUNITY): Payer: Medicare Other

## 2014-04-06 ENCOUNTER — Ambulatory Visit (INDEPENDENT_AMBULATORY_CARE_PROVIDER_SITE_OTHER): Payer: Medicare Other | Admitting: Family Medicine

## 2014-04-06 ENCOUNTER — Telehealth: Payer: Self-pay | Admitting: Family Medicine

## 2014-04-06 ENCOUNTER — Telehealth: Payer: Self-pay | Admitting: Cardiology

## 2014-04-06 ENCOUNTER — Ambulatory Visit (INDEPENDENT_AMBULATORY_CARE_PROVIDER_SITE_OTHER)
Admission: RE | Admit: 2014-04-06 | Discharge: 2014-04-06 | Disposition: A | Discharge status: Home / Self Care | Payer: Medicare Other | Source: Ambulatory Visit | Attending: Cardiology | Admitting: Cardiology

## 2014-04-06 VITALS — BP 110/66 | HR 83 | Temp 97.9°F | Resp 12 | Wt 141.0 lb

## 2014-04-06 DIAGNOSIS — R509 Fever, unspecified: Secondary | ICD-10-CM

## 2014-04-06 DIAGNOSIS — R0609 Other forms of dyspnea: Secondary | ICD-10-CM

## 2014-04-06 DIAGNOSIS — R0600 Dyspnea, unspecified: Secondary | ICD-10-CM

## 2014-04-06 DIAGNOSIS — R059 Cough, unspecified: Secondary | ICD-10-CM

## 2014-04-06 DIAGNOSIS — J189 Pneumonia, unspecified organism: Secondary | ICD-10-CM

## 2014-04-06 DIAGNOSIS — R05 Cough: Secondary | ICD-10-CM

## 2014-04-06 MED ORDER — LEVOFLOXACIN 500 MG PO TABS: 500.0000 mg | ORAL_TABLET | Freq: Every day | ORAL | Status: DC

## 2014-04-06 NOTE — Telephone Encounter (Signed)
Son called this am, has already called dr Ron Parker office this am and was triaged. (there is a phone note) they advised pt to call pcp.  Pt has fever and chills today of 100.9 (8am) and last night (9 pm) 101.3.  General fatigue, no energy and some weakness.  Pt still has appetite and able to eat.  No other symptoms. Dr Ron Parker has ordered a chest XRAY and son is trying to convince pt to go today.  Son would like to know what dr Elease Hashimoto would like for her to do. Pt was seen 10/26 and these are same symptoms, just w/ an added fever. pls advise

## 2014-04-06 NOTE — Progress Notes (Signed)
Subjective:    Patient ID: Carmen Duncan, female    DOB: [birth year], 73 y.o.   MRN: [id number]  HPI Patient seen for evaluation of fever and cough. She's had several months of some progressive dyspnea on exertion. Recent nuclear stress low risk study. She had normal lung exam at most recent visit here but recently saw a cardiologist couple days ago and was noted to have some rales on exam. She then developed fever last night 101.3 with chills. She's had some increased cough which has been nonproductive. We she called this morning she was having fever and we recommended follow-up here. Cardiologist ordered chest x-ray which did show small bilateral pleural effusions with bibasilar pulmonary infiltrates.it was felt these changes could be related to congestive heart failure but bibasilar pneumonia could not be ruled out.  Patient denies any nausea or vomiting. No confusion. Keeping down fluids without difficulty. No blood pressure instability or increased respiratory distress. No history of recent pneumonia. Nonsmoker. She has not had dysuria. No abdominal pain. No headaches. Appetite has been down past couple days.  Past Medical History  Diagnosis Date  . Arthritis   . Asthma   . Hypertension   . GERD (gastroesophageal reflux disease)   . Hyperlipidemia   . Diverticulosis   . IBS (irritable bowel syndrome)   . HOH (hard of hearing) left ear  . Acoustic neuroma    Past Surgical History  Procedure Laterality Date  . Cholecystectomy  2011  . Abdominal hysterectomy  1980  . Tonsillectomy  1945  . Biopsy breast    . Eyes  2007    cararacts    reports that she has never smoked. She has never used smokeless tobacco. She reports that she drinks alcohol. She reports that she does not use illicit drugs. family history includes Heart disease (age of onset: 65) in her father; Hyperlipidemia in her father and mother; Hypertension in her mother; Stroke in her father. Allergies  Allergen Reactions    . Latex     Red angry skin from latex tape  . Zocor [Simvastatin]     nausea  . Sulfonamide Derivatives Rash    hives      Review of Systems  Constitutional: Positive for fever, chills and fatigue.  HENT: Negative for sore throat.   Respiratory: Positive for cough. Negative for shortness of breath and wheezing.   Cardiovascular: Negative for chest pain.  Gastrointestinal: Negative for nausea, vomiting and abdominal pain.  Neurological: Negative for dizziness and headaches.       Objective:   Physical Exam  Constitutional: She is oriented to person, place, and time. She appears well-developed and well-nourished.  HENT:  Mouth/Throat: Oropharynx is clear and moist.  Oropharynx is moist and clear  Neck: Neck supple.  Cardiovascular: Normal rate and regular rhythm.   Pulmonary/Chest: Effort normal. No respiratory distress. She has no wheezes. She has rales.  bibasal rales. No retractions. She has respiratory rate of 12  Musculoskeletal: She exhibits no edema.  Lymphadenopathy:    She has no cervical adenopathy.  Neurological: She is alert and oriented to person, place, and time.  Psychiatric: She has a normal mood and affect. Judgment and thought content normal.          Assessment & Plan:  Patient presents with acute fever, cough, bibasal rales which are new. Clinically, suspect community acquired pneumonia. She does have history of some recent exertional dyspnea but these symptoms are more acute. She could have some underlying  heart failure but suspect this is picture more of acute pneumonia. CURB 65 score of 1 (age).  She does not have any confusion, evidence for dehydration, increased respiratory rate, or any blood pressure instability. She is ambulating without difficulty. Pulse oximetry 96%. Given the above, we gave option of trial of outpatient treatment as she has excellent family support and this is the direction she wishes to proceed. Check CBC. Levaquin 500  milligrams once daily for 10 days. Follow-up immediately for any nausea or vomiting, confusion, increased respiratory distress or any other changes. Reassess 2 days.  She may need to delay getting her ECHO which has been scheduled for this Friday.

## 2014-04-06 NOTE — Telephone Encounter (Signed)
Per Dr. Jacinto Reap he would like for patient to go get chest xray and to come to office to be seen today. Called son and informed him per. Dr. Jacinto Reap.

## 2014-04-06 NOTE — Telephone Encounter (Signed)
Patient's son reports she had a fever last pm of 101.69F. Has a little post nasal drip, with some cough that is dry. Has not had her chest X-ray yet. Temp today is 100.4. Advised Tylenol for fever, and to call her PCP. He states he will do that.

## 2014-04-06 NOTE — Telephone Encounter (Signed)
New Message       Pt's son calling stating that pt is running a fever and wants to know with her recent symptoms if she needs to be seen by Dr. Ron Parker. Please call back and advise.

## 2014-04-06 NOTE — Progress Notes (Signed)
Pre visit review using our clinic review tool, if applicable. No additional management support is needed unless otherwise documented below in the visit note. 

## 2014-04-06 NOTE — Patient Instructions (Signed)

## 2014-04-07 LAB — CBC WITH DIFFERENTIAL/PLATELET
Basophils Absolute: 0 10*3/uL (ref 0.0–0.1)
Basophils Relative: 0.1 % (ref 0.0–3.0)
Eosinophils Absolute: 0.1 10*3/uL (ref 0.0–0.7)
Eosinophils Relative: 1.3 % (ref 0.0–5.0)
HEMATOCRIT: 39.5 % (ref 36.0–46.0)
Hemoglobin: 13.3 g/dL (ref 12.0–15.0)
LYMPHS ABS: 0.8 10*3/uL (ref 0.7–4.0)
Lymphocytes Relative: 7.6 % — ABNORMAL LOW (ref 12.0–46.0)
MCHC: 33.7 g/dL (ref 30.0–36.0)
MCV: 89.4 fl (ref 78.0–100.0)
MONO ABS: 0.4 10*3/uL (ref 0.1–1.0)
Monocytes Relative: 3.7 % (ref 3.0–12.0)
Neutro Abs: 8.7 10*3/uL — ABNORMAL HIGH (ref 1.4–7.7)
Neutrophils Relative %: 87.3 % — ABNORMAL HIGH (ref 43.0–77.0)
PLATELETS: 415 10*3/uL — AB (ref 150.0–400.0)
RBC: 4.42 Mil/uL (ref 3.87–5.11)
RDW: 12.6 % (ref 11.5–15.5)
WBC: 10 10*3/uL (ref 4.0–10.5)

## 2014-04-08 ENCOUNTER — Other Ambulatory Visit (HOSPITAL_COMMUNITY): Payer: Medicare Other

## 2014-04-08 ENCOUNTER — Ambulatory Visit (INDEPENDENT_AMBULATORY_CARE_PROVIDER_SITE_OTHER): Payer: Medicare Other | Admitting: Family Medicine

## 2014-04-08 ENCOUNTER — Encounter: Payer: Self-pay | Admitting: Family Medicine

## 2014-04-08 VITALS — BP 98/64 | HR 86 | Temp 97.3°F | Wt 142.0 lb

## 2014-04-08 DIAGNOSIS — J189 Pneumonia, unspecified organism: Secondary | ICD-10-CM

## 2014-04-08 NOTE — Progress Notes (Signed)
   Subjective:    Patient ID: Carmen Duncan, female    DOB: 05-19-1941, 73 y.o.   MRN: [id number]  HPI Follow-up community acquired pneumonia. Refer to prior note. Patient has had some underlying dyspnea but had acute worsening with cough and fever. X-ray revealed  bibasilar infiltrates and small bilateral effusions. She is not having any confusion or dehydration or increased respiratory rate and blood pressure was stable.  We started Levaquin 500 mg once daily. She has not had any fever today possibly some low-grade fever yesterday. Her white count was 10,000 with left shift. She is keeping down fluids but overall very fatigued. She is able to ambulate in house without too much difficulty. Poor appetite. No nausea or vomiting  Past Medical History  Diagnosis Date  . Arthritis   . Asthma   . Hypertension   . GERD (gastroesophageal reflux disease)   . Hyperlipidemia   . Diverticulosis   . IBS (irritable bowel syndrome)   . HOH (hard of hearing) left ear  . Acoustic neuroma    Past Surgical History  Procedure Laterality Date  . Cholecystectomy  2011  . Abdominal hysterectomy  1980  . Tonsillectomy  1945  . Biopsy breast    . Eyes  2007    cararacts    reports that she has never smoked. She has never used smokeless tobacco. She reports that she drinks alcohol. She reports that she does not use illicit drugs. family history includes Heart disease (age of onset: 64) in her father; Hyperlipidemia in her father and mother; Hypertension in her mother; Stroke in her father. Allergies  Allergen Reactions  . Latex     Red angry skin from latex tape  . Zocor [Simvastatin]     nausea  . Sulfonamide Derivatives Rash    hives      Review of Systems  Constitutional: Positive for fatigue. Negative for fever and chills.  Respiratory: Positive for cough. Negative for wheezing.   Gastrointestinal: Negative for nausea and vomiting.       Objective:   Physical Exam  Constitutional: She  appears well-developed and well-nourished.  HENT:  Mouth/Throat: Oropharynx is clear and moist.  Cardiovascular: Normal rate and regular rhythm.   Pulmonary/Chest: Effort normal. No respiratory distress. She has no wheezes. She has rales.  Musculoskeletal: She exhibits no edema.  Neurological: She is alert.          Assessment & Plan:  Community-acquired bilateral pneumonia. She is stable. She's afebrile. We had some difficulty getting pulse oximetry and eventually obtained 96%. She was able to transfer from chair to table without difficulty. Continue Levaquin. No confusion.  Well hydrated. No BP instability or respiratory distress.  Recommend follow-up early next week and sooner as needed.

## 2014-04-08 NOTE — Patient Instructions (Signed)

## 2014-04-08 NOTE — Progress Notes (Signed)
Pre visit review using our clinic review tool, if applicable. No additional management support is needed unless otherwise documented below in the visit note. 

## 2014-04-12 ENCOUNTER — Encounter: Payer: Self-pay | Admitting: Family Medicine

## 2014-04-12 ENCOUNTER — Ambulatory Visit (INDEPENDENT_AMBULATORY_CARE_PROVIDER_SITE_OTHER): Payer: Medicare Other | Admitting: Family Medicine

## 2014-04-12 VITALS — BP 120/66 | HR 84 | Temp 98.0°F | Wt 140.0 lb

## 2014-04-12 DIAGNOSIS — R531 Weakness: Secondary | ICD-10-CM

## 2014-04-12 DIAGNOSIS — J189 Pneumonia, unspecified organism: Secondary | ICD-10-CM

## 2014-04-12 DIAGNOSIS — B370 Candidal stomatitis: Secondary | ICD-10-CM

## 2014-04-12 LAB — CBC WITH DIFFERENTIAL/PLATELET
Basophils Absolute: 0 10*3/uL (ref 0.0–0.1)
Basophils Relative: 0.2 % (ref 0.0–3.0)
EOS PCT: 0.6 % (ref 0.0–5.0)
Eosinophils Absolute: 0.1 10*3/uL (ref 0.0–0.7)
HEMATOCRIT: 41.5 % (ref 36.0–46.0)
HEMOGLOBIN: 13.8 g/dL (ref 12.0–15.0)
LYMPHS ABS: 0.7 10*3/uL (ref 0.7–4.0)
Lymphocytes Relative: 5 % — ABNORMAL LOW (ref 12.0–46.0)
MCHC: 33.2 g/dL (ref 30.0–36.0)
MCV: 90.6 fl (ref 78.0–100.0)
Monocytes Absolute: 0.6 10*3/uL (ref 0.1–1.0)
Monocytes Relative: 4.4 % (ref 3.0–12.0)
Neutro Abs: 13 10*3/uL — ABNORMAL HIGH (ref 1.4–7.7)
Neutrophils Relative %: 89.8 % — ABNORMAL HIGH (ref 43.0–77.0)
Platelets: 398 10*3/uL (ref 150.0–400.0)
RBC: 4.58 Mil/uL (ref 3.87–5.11)
RDW: 12.9 % (ref 11.5–15.5)
WBC: 14.5 10*3/uL — ABNORMAL HIGH (ref 4.0–10.5)

## 2014-04-12 MED ORDER — NYSTATIN 100000 UNIT/ML MT SUSP: 5.0000 mL | Freq: Four times a day (QID) | OROMUCOSAL | Status: DC

## 2014-04-12 NOTE — Patient Instructions (Signed)
Thrush, Adult  Thrush, also called oral candidiasis, is a fungal infection that develops in the mouth and throat and on the tongue. It causes white patches to form on the mouth and tongue. Thrush is most common in older adults, but it can occur at any age.  Many cases of thrush are mild, but this infection can also be more serious. Thrush can be a recurring problem for people who have chronic illnesses or who take medicines that limit the body's ability to fight infection. Because these people have difficulty fighting infections, the fungus that causes thrush can spread throughout the body. This can cause life-threatening blood or organ infections. CAUSES  Thrush is usually caused by a yeast called Candida albicans. This fungus is normally present in small amounts in the mouth and on other mucous membranes. It usually causes no harm. However, when conditions are present that allow the fungus to grow uncontrolled, it invades surrounding tissues and becomes an infection. Less often, other Candida species can also lead to thrush.  RISK FACTORS Thrush is more likely to develop in the following people:  People with an impaired ability to fight infection (weakened immune system).   Older adults.   People with HIV.   People with diabetes.   People with dry mouth (xerostomia).   Pregnant women.   People with poor dental care, especially those who have false teeth.   People who use antibiotic medicines.  SIGNS AND SYMPTOMS  Thrush can be a mild infection that causes no symptoms. If symptoms develop, they may include:   A burning feeling in the mouth and throat. This can occur at the start of a thrush infection.   White patches that adhere to the mouth and tongue. The tissue around the patches may be red, raw, and painful. If rubbed (during tooth brushing, for example), the patches and the tissue of the mouth may bleed easily.   A bad taste in the mouth or difficulty tasting foods.    Cottony feeling in the mouth.   Pain during eating and swallowing. DIAGNOSIS  Your health care provider can usually diagnose thrush by looking in your mouth and asking you questions about your health.  TREATMENT  Medicines that help prevent the growth of fungi (antifungals) are the standard treatment for thrush. These medicines are either applied directly to the affected area (topical) or swallowed (oral). The treatment will depend on the severity of the condition.  Mild Thrush Mild cases of thrush may clear up with the use of an antifungal mouth rinse or lozenges. Treatment usually lasts about 14 days.  Moderate to Severe Thrush  More severe thrush infections that have spread to the esophagus are treated with an oral antifungal medicine. A topical antifungal medicine may also be used.   For some severe infections, a treatment period longer than 14 days may be needed.   Oral antifungal medicines are almost never used during pregnancy because the fetus may be harmed. However, if a pregnant woman has a rare, severe thrush infection that has spread to her blood, oral antifungal medicines may be used. In this case, the risk of harm to the mother and fetus from the severe thrush infection may be greater than the risk posed by the use of antifungal medicines.  Persistent or Recurrent Thrush For cases of thrush that do not go away or keep coming back, treatment may involve the following:   Treatment may be needed twice as long as the symptoms last.   Treatment will   include both oral and topical antifungal medicines.   People with weakened immune systems can take an antifungal medicine on a continuous basis to prevent thrush infections.  It is important to treat conditions that make you more likely to get thrush, such as diabetes or HIV.  HOME CARE INSTRUCTIONS   Only take over-the-counter or prescription medicine as directed by your health care provider. Talk to your health care  provider about an over-the-counter medicine called gentian violet, which kills bacteria and fungi.   Eat plain, unflavored yogurt as directed by your health care provider. Check the label to make sure the yogurt contains live cultures. This yogurt can help healthy bacteria grow in the mouth that can stop the growth of the fungus that causes thrush.   Try these measures to help reduce the discomfort of thrush:   Drink cold liquids such as water or iced tea.   Try flavored ice treats or frozen juices.   Eat foods that are easy to swallow, such as gelatin, ice cream, or custard.   If the patches in your mouth are painful, try drinking from a straw.   Rinse your mouth several times a day with a warm saltwater rinse. You can make the saltwater mixture with 1 tsp (6 g) of salt in 8 fl oz (0.2 L) of warm water.   If you wear dentures, remove the dentures before going to bed, brush them vigorously, and soak them in a cleaning solution as directed by your health care provider.   Women who are breastfeeding should clean their nipples with an antifungal medicine as directed by their health care provider. Dry the nipples after breastfeeding. Applying lanolin-containing body lotion may help relieve nipple soreness.  SEEK MEDICAL CARE IF:  Your symptoms are getting worse or are not improving within 7 days of starting treatment.   You have symptoms of spreading infection, such as white patches on the skin outside of the mouth.   You are nursing and you have redness, burning, or pain in the nipples that is not relieved with treatment.  MAKE SURE YOU:  Understand these instructions.  Will watch your condition.  Will get help right away if you are not doing well or get worse. Document Released: 02/06/2004 Document Revised: 03/03/2013 Document Reviewed: 12/14/2012 Exit[employer] Patient Information 2015 [employer]. This information is not intended to replace advice given to you by your  health care provider. Make sure you discuss any questions you have with your health care provider.  

## 2014-04-12 NOTE — Progress Notes (Signed)
   Subjective:    Patient ID: Carmen Duncan, female    DOB: May 12, 1941, 73 y.o.   MRN: [id number]  HPI Follow-up community-acquired pneumonia. Patient is on day 6 of Levaquin. She stills feels very fatigued and low energy. Poor appetite but keeping down fluids. She's not had any confusion. No increased work of breathing. She is not aware of any recurrent fevers that she did have some mild chills last night. She has developed some whitish patches on her tongue and mouth with minimal soreness and this is over the past couple of days.. No pain with swallowing.Minimal cough.  Past Medical History  Diagnosis Date  . Arthritis   . Asthma   . Hypertension   . GERD (gastroesophageal reflux disease)   . Hyperlipidemia   . Diverticulosis   . IBS (irritable bowel syndrome)   . HOH (hard of hearing) left ear  . Acoustic neuroma    Past Surgical History  Procedure Laterality Date  . Cholecystectomy  2011  . Abdominal hysterectomy  1980  . Tonsillectomy  1945  . Biopsy breast    . Eyes  2007    cararacts    reports that she has never smoked. She has never used smokeless tobacco. She reports that she drinks alcohol. She reports that she does not use illicit drugs. family history includes Heart disease (age of onset: 19) in her father; Hyperlipidemia in her father and mother; Hypertension in her mother; Stroke in her father. Allergies  Allergen Reactions  . Latex     Red angry skin from latex tape  . Zocor [Simvastatin]     nausea  . Sulfonamide Derivatives Rash    hives      Review of Systems  Constitutional: Positive for appetite change and fatigue. Negative for fever.  HENT: Negative for sore throat.   Respiratory: Positive for cough. Negative for shortness of breath and wheezing.   Gastrointestinal: Positive for nausea. Negative for vomiting and abdominal pain.  Genitourinary: Negative for dysuria.  Neurological: Negative for syncope.  Psychiatric/Behavioral: Negative for  confusion.       Objective:   Physical Exam  Constitutional: She appears well-developed and well-nourished. No distress.  HENT:  Oropharynx reveals some whitish patches on the tongue and buccal mucosa bilaterally  Cardiovascular: Normal rate and regular rhythm.   Pulmonary/Chest: Effort normal.  Bibasilar crackles. No wheezes. Respiratory rate around 12  Musculoskeletal: She exhibits no edema.  Neurological: She is alert.  Psychiatric: She has a normal mood and affect. Her behavior is normal.          Assessment & Plan:  #1 Community acquired pneumonia. She has generalized weakness but has not had any confusion, evidence for dehydration, increased respiratory rate, blood pressure instability. CURB 65 score was 1 on initial presentation, so we gave option of outpatient treatment.  Continue Levaquin. Repeat CBC. Monitor for fever #2 thrush which is new. Nystatin oral suspension 5 mL 4 times a day. Probably secondary to antibiotics.  no hx of diabetes.  Reassess one week #3 generalized weakness.  Suspect largely secondary to #1 she does have hx of hyponatremia.  Check CMP.

## 2014-04-12 NOTE — Progress Notes (Signed)
Pre visit review using our clinic review tool, if applicable. No additional management support is needed unless otherwise documented below in the visit note. 

## 2014-04-13 LAB — BASIC METABOLIC PANEL
BUN: 26 mg/dL — ABNORMAL HIGH (ref 6–23)
CO2: 24 mEq/L (ref 19–32)
Calcium: 8.4 mg/dL (ref 8.4–10.5)
Chloride: 93 mEq/L — ABNORMAL LOW (ref 96–112)
Creatinine, Ser: 1.4 mg/dL — ABNORMAL HIGH (ref 0.4–1.2)
GFR: 39.53 mL/min — AB (ref >60.00)
Glucose, Bld: 103 mg/dL — ABNORMAL HIGH (ref 70–99)
Potassium: 3.5 mEq/L (ref 3.5–5.1)
Sodium: 127 mEq/L — ABNORMAL LOW (ref 135–145)

## 2014-04-14 ENCOUNTER — Telehealth: Payer: Self-pay

## 2014-04-14 NOTE — Telephone Encounter (Signed)
Pt son called and stated last night he took patient temp it was 98.6. She ate a couple pieces of fruit and stop stated she was nausea. Went up one step and couldn't make it any further sat down for awhile. When she got up felt nausea and was having hot flashes. Pt is really not drinking a lot of fluids. Pt son wants to know do you him to monitor her one more day or do you think she should be admitted. Or what else can be done.

## 2014-04-14 NOTE — Telephone Encounter (Signed)
If she is not taking fluids and having severe shortness of breath with minimal movement, I think we should admit.

## 2014-04-14 NOTE — Telephone Encounter (Signed)
Pt son is informed

## 2014-04-18 ENCOUNTER — Telehealth: Payer: Self-pay | Admitting: Family Medicine

## 2014-04-18 ENCOUNTER — Encounter (HOSPITAL_COMMUNITY): Payer: Self-pay | Admitting: Emergency Medicine

## 2014-04-18 ENCOUNTER — Telehealth: Payer: Self-pay

## 2014-04-18 ENCOUNTER — Emergency Department (HOSPITAL_COMMUNITY): Payer: Medicare Other

## 2014-04-18 ENCOUNTER — Inpatient Hospital Stay (HOSPITAL_COMMUNITY)
Admission: EM | Admit: 2014-04-18 | Discharge: 2014-04-23 | DRG: 640 | Disposition: A | Discharge status: Home / Self Care | Payer: Medicare Other | Attending: Internal Medicine | Admitting: Internal Medicine

## 2014-04-18 DIAGNOSIS — E222 Syndrome of inappropriate secretion of antidiuretic hormone: Secondary | ICD-10-CM | POA: Diagnosis present

## 2014-04-18 DIAGNOSIS — I5031 Acute diastolic (congestive) heart failure: Secondary | ICD-10-CM | POA: Diagnosis present

## 2014-04-18 DIAGNOSIS — E785 Hyperlipidemia, unspecified: Secondary | ICD-10-CM | POA: Diagnosis present

## 2014-04-18 DIAGNOSIS — J8489 Other specified interstitial pulmonary diseases: Secondary | ICD-10-CM | POA: Diagnosis present

## 2014-04-18 DIAGNOSIS — J918 Pleural effusion in other conditions classified elsewhere: Secondary | ICD-10-CM | POA: Diagnosis present

## 2014-04-18 DIAGNOSIS — E876 Hypokalemia: Secondary | ICD-10-CM | POA: Diagnosis present

## 2014-04-18 DIAGNOSIS — R0602 Shortness of breath: Secondary | ICD-10-CM | POA: Diagnosis present

## 2014-04-18 DIAGNOSIS — Z882 Allergy status to sulfonamides status: Secondary | ICD-10-CM | POA: Diagnosis not present

## 2014-04-18 DIAGNOSIS — M1990 Unspecified osteoarthritis, unspecified site: Secondary | ICD-10-CM | POA: Diagnosis present

## 2014-04-18 DIAGNOSIS — Z7982 Long term (current) use of aspirin: Secondary | ICD-10-CM | POA: Diagnosis not present

## 2014-04-18 DIAGNOSIS — Z79899 Other long term (current) drug therapy: Secondary | ICD-10-CM | POA: Diagnosis not present

## 2014-04-18 DIAGNOSIS — E860 Dehydration: Secondary | ICD-10-CM | POA: Diagnosis present

## 2014-04-18 DIAGNOSIS — H9190 Unspecified hearing loss, unspecified ear: Secondary | ICD-10-CM | POA: Diagnosis present

## 2014-04-18 DIAGNOSIS — Z6827 Body mass index (BMI) 27.0-27.9, adult: Secondary | ICD-10-CM

## 2014-04-18 DIAGNOSIS — J45909 Unspecified asthma, uncomplicated: Secondary | ICD-10-CM | POA: Diagnosis present

## 2014-04-18 DIAGNOSIS — R339 Retention of urine, unspecified: Secondary | ICD-10-CM | POA: Diagnosis present

## 2014-04-18 DIAGNOSIS — D696 Thrombocytopenia, unspecified: Secondary | ICD-10-CM | POA: Diagnosis present

## 2014-04-18 DIAGNOSIS — Z91040 Latex allergy status: Secondary | ICD-10-CM | POA: Diagnosis not present

## 2014-04-18 DIAGNOSIS — E871 Hypo-osmolality and hyponatremia: Secondary | ICD-10-CM

## 2014-04-18 DIAGNOSIS — J84112 Idiopathic pulmonary fibrosis: Secondary | ICD-10-CM | POA: Diagnosis present

## 2014-04-18 DIAGNOSIS — J189 Pneumonia, unspecified organism: Secondary | ICD-10-CM

## 2014-04-18 DIAGNOSIS — I10 Essential (primary) hypertension: Secondary | ICD-10-CM | POA: Diagnosis present

## 2014-04-18 DIAGNOSIS — K219 Gastro-esophageal reflux disease without esophagitis: Secondary | ICD-10-CM | POA: Diagnosis present

## 2014-04-18 DIAGNOSIS — E861 Hypovolemia: Secondary | ICD-10-CM

## 2014-04-18 DIAGNOSIS — E440 Moderate protein-calorie malnutrition: Secondary | ICD-10-CM | POA: Diagnosis present

## 2014-04-18 DIAGNOSIS — J849 Interstitial pulmonary disease, unspecified: Secondary | ICD-10-CM

## 2014-04-18 DIAGNOSIS — R531 Weakness: Secondary | ICD-10-CM

## 2014-04-18 LAB — COMPREHENSIVE METABOLIC PANEL
ALK PHOS: 40 U/L (ref 39–117)
ALT: 50 U/L — AB (ref 0–35)
ANION GAP: 15 (ref 5–15)
AST: 63 U/L — ABNORMAL HIGH (ref 0–37)
Albumin: 2.1 g/dL — ABNORMAL LOW (ref 3.5–5.2)
BUN: 27 mg/dL — AB (ref 6–23)
CO2: 22 meq/L (ref 19–32)
Calcium: 8.2 mg/dL — ABNORMAL LOW (ref 8.4–10.5)
Chloride: 88 mEq/L — ABNORMAL LOW (ref 96–112)
Creatinine, Ser: 0.95 mg/dL (ref 0.50–1.10)
GFR calc non Af Amer: 57 mL/min — ABNORMAL LOW (ref >90)
GFR, EST AFRICAN AMERICAN: 66 mL/min — AB (ref >90)
GLUCOSE: 119 mg/dL — AB (ref 70–99)
POTASSIUM: 3.3 meq/L — AB (ref 3.7–5.3)
Sodium: 125 mEq/L — ABNORMAL LOW (ref 137–147)
TOTAL PROTEIN: 5.5 g/dL — AB (ref 6.0–8.3)
Total Bilirubin: 0.5 mg/dL (ref 0.3–1.2)

## 2014-04-18 LAB — URINALYSIS, ROUTINE W REFLEX MICROSCOPIC
BILIRUBIN URINE: NEGATIVE
Glucose, UA: NEGATIVE mg/dL
HGB URINE DIPSTICK: NEGATIVE
KETONES UR: NEGATIVE mg/dL
Leukocytes, UA: NEGATIVE
NITRITE: NEGATIVE
PH: 5.5 (ref 5.0–8.0)
Protein, ur: NEGATIVE mg/dL
SPECIFIC GRAVITY, URINE: 1.018 (ref 1.005–1.030)
Urobilinogen, UA: 0.2 mg/dL (ref 0.0–1.0)

## 2014-04-18 LAB — CBC
HEMATOCRIT: 41.6 % (ref 36.0–46.0)
HEMOGLOBIN: 14.6 g/dL (ref 12.0–15.0)
MCH: 30.3 pg (ref 26.0–34.0)
MCHC: 35.1 g/dL (ref 30.0–36.0)
MCV: 86.3 fL (ref 78.0–100.0)
Platelets: 168 10*3/uL (ref 150–400)
RBC: 4.82 MIL/uL (ref 3.87–5.11)
RDW: 13.3 % (ref 11.5–15.5)
WBC: 11.9 10*3/uL — ABNORMAL HIGH (ref 4.0–10.5)

## 2014-04-18 LAB — I-STAT TROPONIN, ED: Troponin i, poc: 0.07 ng/mL (ref 0.00–0.08)

## 2014-04-18 LAB — I-STAT CG4 LACTIC ACID, ED: LACTIC ACID, VENOUS: 1.78 mmol/L (ref 0.5–2.2)

## 2014-04-18 MED ORDER — FOLIC ACID 1 MG PO TABS
1.0000 mg | ORAL_TABLET | Freq: Every day | ORAL | Status: DC
  Administered 2014-04-19 – 2014-04-23 (×5): 1 mg via ORAL
  Filled 2014-04-18 (×5): qty 1

## 2014-04-18 MED ORDER — ACETAMINOPHEN 650 MG RE SUPP: 650.0000 mg | Freq: Four times a day (QID) | RECTAL | Status: DC | PRN

## 2014-04-18 MED ORDER — SODIUM CHLORIDE 0.9 % IV BOLUS (SEPSIS)
500.0000 mL | Freq: Once | INTRAVENOUS | Status: AC
  Administered 2014-04-18: 500 mL via INTRAVENOUS

## 2014-04-18 MED ORDER — ACETAMINOPHEN 325 MG PO TABS: 650.0000 mg | ORAL_TABLET | Freq: Four times a day (QID) | ORAL | Status: DC | PRN

## 2014-04-18 MED ORDER — ATORVASTATIN CALCIUM 20 MG PO TABS
20.0000 mg | ORAL_TABLET | Freq: Every day | ORAL | Status: DC
  Administered 2014-04-19 – 2014-04-23 (×5): 20 mg via ORAL
  Filled 2014-04-18 (×5): qty 1

## 2014-04-18 MED ORDER — VITAMIN B-1 100 MG PO TABS
100.0000 mg | ORAL_TABLET | Freq: Every day | ORAL | Status: DC
  Administered 2014-04-19 – 2014-04-23 (×5): 100 mg via ORAL
  Filled 2014-04-18 (×5): qty 1

## 2014-04-18 MED ORDER — SODIUM CHLORIDE 0.9 % IJ SOLN
3.0000 mL | Freq: Two times a day (BID) | INTRAMUSCULAR | Status: DC
  Administered 2014-04-19 – 2014-04-23 (×8): 3 mL via INTRAVENOUS

## 2014-04-18 MED ORDER — SODIUM CHLORIDE 0.9 % IV SOLN: INTRAVENOUS | Status: DC

## 2014-04-18 MED ORDER — ONDANSETRON HCL 4 MG/2ML IJ SOLN: 4.0000 mg | Freq: Four times a day (QID) | INTRAMUSCULAR | Status: DC | PRN

## 2014-04-18 MED ORDER — POLYVINYL ALCOHOL 1.4 % OP SOLN: 1.0000 [drp] | OPHTHALMIC | Status: DC | PRN

## 2014-04-18 MED ORDER — PROPRANOLOL HCL 40 MG PO TABS
40.0000 mg | ORAL_TABLET | Freq: Every day | ORAL | Status: DC
  Administered 2014-04-18 – 2014-04-19 (×2): 40 mg via ORAL
  Filled 2014-04-18 (×3): qty 1

## 2014-04-18 MED ORDER — CILIDINIUM-CHLORDIAZEPOXIDE 2.5-5 MG PO CAPS
1.0000 | ORAL_CAPSULE | Freq: Two times a day (BID) | ORAL | Status: DC | PRN
  Filled 2014-04-18: qty 1

## 2014-04-18 MED ORDER — RISAQUAD PO CAPS
1.0000 | ORAL_CAPSULE | Freq: Every day | ORAL | Status: DC
  Administered 2014-04-18 – 2014-04-22 (×5): 1 via ORAL
  Filled 2014-04-18 (×7): qty 1

## 2014-04-18 MED ORDER — PANTOPRAZOLE SODIUM 40 MG PO TBEC
40.0000 mg | DELAYED_RELEASE_TABLET | Freq: Every day | ORAL | Status: DC
  Administered 2014-04-19 – 2014-04-23 (×5): 40 mg via ORAL
  Filled 2014-04-18 (×4): qty 1

## 2014-04-18 MED ORDER — HEPARIN SODIUM (PORCINE) 5000 UNIT/ML IJ SOLN
5000.0000 [IU] | Freq: Three times a day (TID) | INTRAMUSCULAR | Status: DC
  Administered 2014-04-18 – 2014-04-23 (×15): 5000 [IU] via SUBCUTANEOUS
  Filled 2014-04-18 (×20): qty 1

## 2014-04-18 MED ORDER — ASPIRIN EC 81 MG PO TBEC
81.0000 mg | DELAYED_RELEASE_TABLET | Freq: Every day | ORAL | Status: DC
  Administered 2014-04-19 – 2014-04-23 (×5): 81 mg via ORAL
  Filled 2014-04-18 (×5): qty 1

## 2014-04-18 MED ORDER — NYSTATIN 100000 UNIT/ML MT SUSP
5.0000 mL | Freq: Four times a day (QID) | OROMUCOSAL | Status: DC
  Administered 2014-04-19 – 2014-04-23 (×18): 500000 [IU] via ORAL
  Filled 2014-04-18 (×25): qty 5

## 2014-04-18 MED ORDER — SODIUM CHLORIDE 0.9 % IV SOLN
INTRAVENOUS | Status: DC
  Administered 2014-04-18 – 2014-04-20 (×5): via INTRAVENOUS

## 2014-04-18 MED ORDER — ONDANSETRON HCL 4 MG PO TABS: 4.0000 mg | ORAL_TABLET | Freq: Four times a day (QID) | ORAL | Status: DC | PRN

## 2014-04-18 MED ORDER — TOPIRAMATE 100 MG PO TABS
100.0000 mg | ORAL_TABLET | Freq: Every day | ORAL | Status: DC
  Administered 2014-04-18 – 2014-04-22 (×5): 100 mg via ORAL
  Filled 2014-04-18 (×6): qty 1

## 2014-04-18 MED ORDER — OXYCODONE HCL 5 MG PO TABS: 5.0000 mg | ORAL_TABLET | ORAL | Status: DC | PRN

## 2014-04-18 MED ORDER — ADULT MULTIVITAMIN W/MINERALS CH
1.0000 | ORAL_TABLET | Freq: Every day | ORAL | Status: DC
  Administered 2014-04-19 – 2014-04-23 (×5): 1 via ORAL
  Filled 2014-04-18 (×5): qty 1

## 2014-04-18 MED ORDER — ONDANSETRON HCL 4 MG/2ML IJ SOLN
4.0000 mg | Freq: Once | INTRAMUSCULAR | Status: AC
  Administered 2014-04-18: 4 mg via INTRAVENOUS
  Filled 2014-04-18: qty 2

## 2014-04-18 NOTE — Progress Notes (Signed)
ANTIBIOTIC CONSULT NOTE - INITIAL  Pharmacy Consult for rocephin Indication: pneumonia  Allergies  Allergen Reactions  . Latex Itching, Rash and Other (See Comments)    Red angry skin from latex tape  . Sulfonamide Derivatives Rash    hives  . Zocor [Simvastatin] Nausea Only    Patient Measurements: Height: 5\' 1"  (154.9 cm) Weight: 144 lb (65.318 kg) (bedscale; pt c/o dizziness) IBW/kg (Calculated) : 47.8 Adjusted Body Weight:   Vital Signs: Temp: 98.3 F (36.8 C) (11/23 2316) Temp Source: Oral (11/23 2316) BP: 114/32 mmHg (11/23 2316) Pulse Rate: 108 (11/23 2316) Intake/Output from previous day:   Intake/Output from this shift: Total I/O In: -  Out: 15 [Urine:15]  Labs:  Recent Labs  04/18/14 1933  WBC 11.9*  HGB 14.6  PLT 168  CREATININE 0.95   Estimated Creatinine Clearance: 44.3 mL/min (by C-G formula based on Cr of 0.95). No results for input(s): VANCOTROUGH, VANCOPEAK, VANCORANDOM, GENTTROUGH, GENTPEAK, GENTRANDOM, TOBRATROUGH, TOBRAPEAK, TOBRARND, AMIKACINPEAK, AMIKACINTROU, AMIKACIN in the last 72 hours.   Microbiology: No results found for this or any previous visit (from the past 720 hour(s)).  Medical History: Past Medical History  Diagnosis Date  . Arthritis   . Asthma   . Hypertension   . GERD (gastroesophageal reflux disease)   . Hyperlipidemia   . Diverticulosis   . IBS (irritable bowel syndrome)   . HOH (hard of hearing) left ear  . Acoustic neuroma     Medications:  Prescriptions prior to admission  Medication Sig Dispense Refill Last Dose  . acetaminophen (TYLENOL) 325 MG tablet Take 650 mg by mouth every 6 (six) hours as needed for headache.   Past Month at Unknown time  . aspirin 81 MG tablet Take 81 mg by mouth daily.     04/17/2014 at Unknown time  . atorvastatin (LIPITOR) 20 MG tablet Take 1 tablet (20 mg total) by mouth daily. 90 tablet 2 04/17/2014 at Unknown time  . cetirizine (ZYRTEC) 10 MG tablet Take 10 mg by mouth  daily.   04/17/2014 at Unknown time  . clidinium-chlordiazePOXIDE (LIBRAX) 5-2.5 MG per capsule Take 1 capsule by mouth 2 (two) times daily as needed. Pt only taking prn due to cost 60 capsule 0 04/17/2014 at Unknown time  . lisinopril-hydrochlorothiazide (PRINZIDE,ZESTORETIC) 20-12.5 MG per tablet Take 1 tablet by mouth daily. 90 tablet 3 04/18/2014 at Unknown time  . nystatin (MYCOSTATIN) 100000 UNIT/ML suspension Take 5 mLs (500,000 Units total) by mouth 4 (four) times daily. 60 mL 0 04/17/2014 at Unknown time  . omeprazole (PRILOSEC OTC) 20 MG tablet Take 20 mg by mouth daily.    04/17/2014 at Unknown time  . Oxygen Permeable Lens Products (OPTIMUM WETTING/REWETTING DROP) SOLN Place 1 drop into both eyes as needed (for dry eyes).   04/17/2014 at Unknown time  . Probiotic Product (ALIGN) 4 MG CAPS Take 4 mg by mouth at bedtime.    04/17/2014 at Unknown time  . propranolol (INDERAL) 40 MG tablet Take 1 tablet (40 mg total) by mouth 2 (two) times daily. (Patient taking differently: Take 40 mg by mouth at bedtime. ) 180 tablet 2 04/17/2014 at 2000  . topiramate (TOPAMAX) 50 MG tablet Take 2 tablets (100 mg total) by mouth at bedtime. 60 tablet 5 04/17/2014 at Unknown time  . UNABLE TO FIND Place 1 application into the nose as needed (for sinuses). Milta Deiters med sinus rinse   Past Month at Unknown time  . levofloxacin (LEVAQUIN) 500 MG tablet Take 1  tablet (500 mg total) by mouth daily. (Patient not taking: Reported on 04/18/2014) 10 tablet 0 Completed Course at 04/15/14   Assessment:   Pt treated for outpatient double PNA with full course of levaquin.   infiltrates remain on Cxray. MD wants rocephin and x ray f/u    Goal of Therapy:  Resolution of infiltrates and PNA   Plan:  Rocephin 1 gm q24h x 7-10 days or until DC  Curlene Dolphin 04/18/2014,11:45 PM

## 2014-04-18 NOTE — ED Notes (Signed)
Handed EKG to Dr Ashok Cordia.

## 2014-04-18 NOTE — Telephone Encounter (Signed)
Spoke with her son.  She has remained weak and earlier today chills and temp 99.4.  I have recommended evaluation in ED for labs, f/u CXR etc.   She completed full course of Levaquin and has not improved significantly on this.  Son will call EMS as he does not feel he can transport her himself.

## 2014-04-18 NOTE — ED Provider Notes (Addendum)
CSN: [id number]     Arrival date & time 04/18/14  1915 History   First MD Initiated Contact with Patient 04/18/14 2013     Chief Complaint  Patient presents with  . Weakness     (Consider location/radiation/quality/duration/timing/severity/associated sxs/prior Treatment) Patient is a 73 y.o. female presenting with weakness. The history is provided by the patient.  Weakness Pertinent negatives include no chest pain, no abdominal pain, no headaches and no shortness of breath.  pt with hx htn, c/po generalized weakness, poor appetite, poor po intake, in the past week. Was recently tx for pna with levaquin as outpt.  Finished a couple days ago but states while cough improved, feels generally weak, lightheaded when stands and has had poor po intake.  Pt denies headache. No sore throat, or other uri c/o. Rare non prod cough. No sob. Had cp earlier in day, none currently, pt unsure how long lasted, possibly a couple minutes. Denies abd pain. Nausea. No vomiting or diarrhea. No dysuria or gu c/o. Compliant w normal meds.      Past Medical History  Diagnosis Date  . Arthritis   . Asthma   . Hypertension   . GERD (gastroesophageal reflux disease)   . Hyperlipidemia   . Diverticulosis   . IBS (irritable bowel syndrome)   . HOH (hard of hearing) left ear  . Acoustic neuroma    Past Surgical History  Procedure Laterality Date  . Cholecystectomy  2011  . Abdominal hysterectomy  1980  . Tonsillectomy  1945  . Biopsy breast    . Eyes  2007    cararacts   Family History  Problem Relation Age of Onset  . Hyperlipidemia Mother   . Hypertension Mother   . Hyperlipidemia Father   . Heart disease Father 43  . Stroke Father    History  Substance Use Topics  . Smoking status: Never Smoker   . Smokeless tobacco: Never Used  . Alcohol Use: Yes     Comment: wine about 3 times a week   OB History    No data available     Review of Systems  Constitutional: Negative for fever and  chills.  HENT: Negative for sore throat.   Eyes: Negative for redness.  Respiratory: Negative for cough and shortness of breath.   Cardiovascular: Negative for chest pain.  Gastrointestinal: Positive for nausea. Negative for vomiting, abdominal pain and diarrhea.  Genitourinary: Negative for dysuria and flank pain.  Musculoskeletal: Negative for back pain and neck pain.  Skin: Negative for rash.  Neurological: Positive for weakness. Negative for numbness and headaches.  Hematological: Does not bruise/bleed easily.  Psychiatric/Behavioral: Negative for confusion.      Allergies  Latex; Sulfonamide derivatives; and Zocor  Home Medications   Prior to Admission medications   Medication Sig Start Date End Date Taking? Authorizing Provider  aspirin 81 MG tablet Take 81 mg by mouth daily.      Historical Provider, MD  atorvastatin (LIPITOR) 20 MG tablet Take 1 tablet (20 mg total) by mouth daily. 09/28/13   [name], MD  cetirizine (ZYRTEC) 10 MG tablet Take 10 mg by mouth daily.    Historical Provider, MD  clidinium-chlordiazePOXIDE (LIBRAX) 5-2.5 MG per capsule Take 1 capsule by mouth 2 (two) times daily as needed. Pt only taking prn due to cost 10/22/13   [name], MD  levofloxacin (LEVAQUIN) 500 MG tablet Take 1 tablet (500 mg total) by mouth daily. 04/06/14   [name],  MD  lisinopril-hydrochlorothiazide (PRINZIDE,ZESTORETIC) 20-12.5 MG per tablet Take 1 tablet by mouth daily. 10/20/13   [name], MD  nystatin (MYCOSTATIN) 100000 UNIT/ML suspension Take 5 mLs (500,000 Units total) by mouth 4 (four) times daily. 04/12/14   [name], MD  omeprazole (PRILOSEC OTC) 20 MG tablet Take 20 mg by mouth. 2 times daily per Dr Oletta Lamas    Historical Provider, MD  Probiotic Product (ALIGN) 4 MG CAPS Take by mouth daily.      Historical Provider, MD  propranolol (INDERAL) 40 MG tablet Take 1 tablet (40 mg total) by mouth 2 (two) times daily. 04/04/14   [name], MD  topiramate (TOPAMAX) 50 MG tablet Take 2 tablets (100 mg total) by mouth at bedtime. 01/12/14   Eustace Quail Tat, DO  UNABLE TO FIND Milta Deiters med sinus rinse    Historical Provider, MD   BP 103/40 mmHg  Pulse 105  Temp(Src) 98.8 F (37.1 C) (Oral)  Resp 23  Ht 5\' 1"  (1.549 m)  Wt 138 lb (62.596 kg)  BMI 26.09 kg/m2  SpO2 98% Physical Exam  Constitutional: She is oriented to person, place, and time.  Generally weak,, frail appearing.   HENT:  Mouth/Throat: Oropharynx is clear and moist.  Eyes: Conjunctivae are normal. Pupils are equal, round, and reactive to light. No scleral icterus.  Neck: Neck supple. No tracheal deviation present.  No stiffness or rigidity  Cardiovascular: Normal rate, regular rhythm, normal heart sounds and intact distal pulses.   Pulmonary/Chest: Effort normal and breath sounds normal. No respiratory distress.  Abdominal: Soft. Normal appearance and bowel sounds are normal. She exhibits no distension and no mass. There is no tenderness. There is no rebound and no guarding.  Genitourinary:  No cva tenderness  Musculoskeletal: She exhibits no edema.  Neurological: She is alert and oriented to person, place, and time.  Motor intact bil. Equal grip, stre 5/5   Skin: Skin is warm and dry. No rash noted.  Psychiatric: She has a normal mood and affect.  Nursing note and vitals reviewed.   ED Course  Procedures (including critical care time)   Results for orders placed or performed during the hospital encounter of 04/18/14  CBC  Result Value Ref Range   WBC 11.9 (H) 4.0 - 10.5 K/uL   RBC 4.82 3.87 - 5.11 MIL/uL   Hemoglobin 14.6 12.0 - 15.0 g/dL   HCT 41.6 36.0 - 46.0 %   MCV 86.3 78.0 - 100.0 fL   MCH 30.3 26.0 - 34.0 pg   MCHC 35.1 30.0 - 36.0 g/dL   RDW 13.3 11.5 - 15.5 %   Platelets 168 150 - 400 K/uL  Comprehensive metabolic panel  Result Value Ref Range   Sodium 125 (L) 137 - 147 mEq/L   Potassium 3.3 (L) 3.7 - 5.3 mEq/L   Chloride 88  (L) 96 - 112 mEq/L   CO2 22 19 - 32 mEq/L   Glucose, Bld 119 (H) 70 - 99 mg/dL   BUN 27 (H) 6 - 23 mg/dL   Creatinine, Ser 0.95 0.50 - 1.10 mg/dL   Calcium 8.2 (L) 8.4 - 10.5 mg/dL   Total Protein 5.5 (L) 6.0 - 8.3 g/dL   Albumin 2.1 (L) 3.5 - 5.2 g/dL   AST 63 (H) 0 - 37 U/L   ALT 50 (H) 0 - 35 U/L   Alkaline Phosphatase 40 39 - 117 U/L   Total Bilirubin 0.5 0.3 - 1.2 mg/dL   GFR  calc non Af Amer 57 (L) >90 mL/min   GFR calc Af Amer 66 (L) >90 mL/min   Anion gap 15 5 - 15  I-Stat Troponin, ED (not at [facility])  Result Value Ref Range   Troponin i, poc 0.07 0.00 - 0.08 ng/mL   Comment 3          I-Stat CG4 Lactic Acid, ED  Result Value Ref Range   Lactic Acid, Venous 1.78 0.5 - 2.2 mmol/L   Dg Chest 2 View  04/18/2014   CLINICAL DATA:  Shortness of breath and chest pain  EXAM: CHEST  2 VIEW  COMPARISON:  04/06/2014  FINDINGS: Cardiac shadow is stable. There is been improved aeration in the bases bilaterally when compare with the prior exam. Some residual effusion is noted particularly on the right. No acute bony abnormality is seen.  IMPRESSION: Improved aeration in the bases bilaterally although the persistent changes of effusion and infiltrate are seen.   Electronically Signed   By: Inez Catalina M.D.   On: 04/18/2014 21:14   Dg Chest 2 View  04/06/2014   CLINICAL DATA:  Cough.  Shortness of breath.  EXAM: CHEST  2 VIEW  COMPARISON:  11/03/2009.  FINDINGS: Mediastinum and hilar structures normal. Borderline cardiomegaly with normal pulmonary vascularity. Bilateral prominent basilar pulmonary infiltrates. Small pleural effusions. Although these changes could be related to congestive heart failure bibasilar pneumonia should be considered. No pneumothorax. No acute bony abnormality .  IMPRESSION: 1. Borderline cardiomegaly, normal pulmonary vascularity. 2. Bibasilar pulmonary infiltrates with small bilateral pleural effusions. Although these changes could be related congestive heart failure  bibasilar pneumonia should be considered.   Electronically Signed   By: Marcello Moores  Register   On: 04/06/2014 16:18        EKG Interpretation   Date/Time:  Monday April 18 2014 19:22:38 EST Ventricular Rate:  115 PR Interval:  139 QRS Duration: 84 QT Interval:  332 QTC Calculation: 459 R Axis:   -33 Text Interpretation:  Sinus tachycardia Non-specific ST-t changes  Confirmed by Ashok Cordia  MD, Lennette Bihari ([id number]) on 04/18/2014 7:25:01 PM      MDM  Iv ns bolus. Labs. Cxr.  Reviewed nursing notes and prior charts for additional history.   Additional ns bolus.   Pt w generalized weakness and poor po intake.  Na low 125, cl low 88 (from 01/20/14 Na 133).  cxr with improved although not resolved infiltrate, although would not necessarily expect complete resolution infiltrate in this time period.  ua still pending.  Ivf. Cath for ua.   Given poor intake, hyponatremia/volume depletion, tachycardia, weakness, will admit to med service.  Given atypical cp earlier, would repeat trop.  Ivf. Follow up on ua results when back.  If worsening cough and/or fevers, would consider additional abx for infiltrate.   Hospitalist paged.      Mirna Mires, MD 04/18/14 [phone]

## 2014-04-18 NOTE — Telephone Encounter (Signed)
Patient's son would like to talk to you about the patient still not feeling well.  She "may" have a low grade fever, finished her medication, and over all hasn't gotten any better. He declined CAN and would like to speak to you.

## 2014-04-18 NOTE — ED Notes (Addendum)
Pt to ED via [facility]- pt presents with fever and weakness over the past week.  Pt was recently treated on 10 day course of antibiotics for pneumonia- admits to completing treatment.  Pt reports chest pressure to middle of chest that radiates into her back, admits to nausea.

## 2014-04-18 NOTE — Telephone Encounter (Signed)
**Note De-Identified  Obfuscation** The pt was scheduled to have an Echo on 11/13 but she no showed.  She is scheduled to f/u with Dr Ron Parker on 11/30 to f/u on the Echo. I left a message on the pts VM stating that we need to reschedule her Echo before that visit and asked her to return my call.

## 2014-04-18 NOTE — Progress Notes (Signed)
Patient admitted to unit from ED.  BP on admission 114/32, vital signs otherwise stable.  MD notified of BP and that patient nauseous despite Zofran.  No additional orders at this time.  Patient and family updated on plan of care.  Will continue to monitor.

## 2014-04-18 NOTE — ED Notes (Signed)
The patient refused the in/out cath x1. The tech has reported to the RN in charge.

## 2014-04-18 NOTE — H&P (Signed)
[facility] History and Physical  Carmen Duncan QGB:[id number] DOB: 27-Oct-1940 DOA: 04/18/2014  Referring physician: Lajean Saver, MD PCP: [name], MD   Chief Complaint: Weakness  HPI: Carmen Duncan is a 73 y.o. female presents with weakness. She states taht she was recently treated for double pneumonia as an outpatient. Patient was given levaquin as an outpatient. Patient states that she has had no energy and has felt extremely tired and fatigued. She has not been taking much fluids or eating well. Patient has no nausea or vomiting. She has had fevers. She has had no diarrhea and she has had no abdominal pain. She has noted decreased urine output. In the ED she had a CXR done and this shows some residual infiltrate and effusion. She has had an improvement in her WBC count but there is noted a decrease in her sodium also.   Review of Systems:  Constitutional:  No weight loss, night sweats, ++Fevers, ++chills, ++fatigue.  HEENT:  No headaches, Difficulty swallowing,Tooth/dental problems,Sore throat  Cardio-vascular:  No chest pain, Orthopnea, PND, swelling in lower extremities, anasarca  GI:  No heartburn, indigestion, abdominal pain, nausea, vomiting, diarrhea  Resp:  No shortness of breath with exertion or at rest. No excess mucus, no productive cough  Skin:  no rash or lesions GU:  no dysuria ++decreased urine output.  Musculoskeletal:  No joint pain or swelling. No decreased range of motion.  Psych:  No change in mood or affect. No depression or anxiety   Past Medical History  Diagnosis Date  . Arthritis   . Asthma   . Hypertension   . GERD (gastroesophageal reflux disease)   . Hyperlipidemia   . Diverticulosis   . IBS (irritable bowel syndrome)   . HOH (hard of hearing) left ear  . Acoustic neuroma    Past Surgical History  Procedure Laterality Date  . Cholecystectomy  2011  . Abdominal hysterectomy  1980  . Tonsillectomy  1945  . Biopsy breast      . Eyes  2007    cararacts   Social History:  reports that she has never smoked. She has never used smokeless tobacco. She reports that she drinks alcohol. She reports that she does not use illicit drugs.  Allergies  Allergen Reactions  . Latex Itching, Rash and Other (See Comments)    Red angry skin from latex tape  . Sulfonamide Derivatives Rash    hives  . Zocor [Simvastatin] Nausea Only    Family History  Problem Relation Age of Onset  . Hyperlipidemia Mother   . Hypertension Mother   . Hyperlipidemia Father   . Heart disease Father 6  . Stroke Father      Prior to Admission medications   Medication Sig Start Date End Date Taking? Authorizing Provider  acetaminophen (TYLENOL) 325 MG tablet Take 650 mg by mouth every 6 (six) hours as needed for headache.   Yes Historical Provider, MD  aspirin 81 MG tablet Take 81 mg by mouth daily.     Yes Historical Provider, MD  atorvastatin (LIPITOR) 20 MG tablet Take 1 tablet (20 mg total) by mouth daily. 09/28/13  Yes [name], MD  cetirizine (ZYRTEC) 10 MG tablet Take 10 mg by mouth daily.   Yes Historical Provider, MD  clidinium-chlordiazePOXIDE (LIBRAX) 5-2.5 MG per capsule Take 1 capsule by mouth 2 (two) times daily as needed. Pt only taking prn due to cost 10/22/13  Yes [name], MD  lisinopril-hydrochlorothiazide (PRINZIDE,ZESTORETIC) 20-12.5  MG per tablet Take 1 tablet by mouth daily. 10/20/13  Yes [name], MD  nystatin (MYCOSTATIN) 100000 UNIT/ML suspension Take 5 mLs (500,000 Units total) by mouth 4 (four) times daily. 04/12/14  Yes [name], MD  omeprazole (PRILOSEC OTC) 20 MG tablet Take 20 mg by mouth daily.    Yes Historical Provider, MD  Oxygen Permeable Lens Products (OPTIMUM WETTING/REWETTING DROP) SOLN Place 1 drop into both eyes as needed (for dry eyes).   Yes Historical Provider, MD  Probiotic Product (ALIGN) 4 MG CAPS Take 4 mg by mouth at bedtime.    Yes Historical Provider, MD   propranolol (INDERAL) 40 MG tablet Take 1 tablet (40 mg total) by mouth 2 (two) times daily. Patient taking differently: Take 40 mg by mouth at bedtime.  04/04/14  Yes [name], MD  topiramate (TOPAMAX) 50 MG tablet Take 2 tablets (100 mg total) by mouth at bedtime. 01/12/14  Yes Rebecca S Tat, DO  UNABLE TO FIND Place 1 application into the nose as needed (for sinuses). Milta Deiters med sinus rinse   Yes Historical Provider, MD  levofloxacin (LEVAQUIN) 500 MG tablet Take 1 tablet (500 mg total) by mouth daily. Patient not taking: Reported on 04/18/2014 04/06/14   [name], MD   Physical Exam: Filed Vitals:   04/18/14 1920 04/18/14 2011 04/18/14 2110 04/18/14 2206  BP: 99/57 103/40 110/48 101/44  Pulse: 117 105 110 109  Temp: 98.7 F (37.1 C) 98.8 F (37.1 C)    TempSrc:  Oral    Resp: 18 23 20 18   Height: 5\' 1"  (1.549 m)     Weight: 62.596 kg (138 lb)     SpO2: 95% 98% 93% 93%    Wt Readings from Last 3 Encounters:  04/18/14 62.596 kg (138 lb)  04/12/14 63.504 kg (140 lb)  04/08/14 64.411 kg (142 lb)    General:  Appears calm and comfortable Eyes: PERRL, normal lids, irises & conjunctiva ENT: grossly normal hearing, lips & dry tongue Cardiovascular: RRR, no m/r/g. No LE edema. Respiratory: CTA bilaterally, no w/r/r. Normal respiratory effort. Abdomen: soft, ntnd Skin: no rash or induration seen on limited exam Musculoskeletal: grossly normal tone BUE/BLE Psychiatric: grossly normal mood and affect, speech fluent and appropriate Neurologic: grossly non-focal.          Labs on Admission:  Basic Metabolic Panel:  Recent Labs Lab 04/12/14 1114 04/18/14 1933  NA 127* 125*  K 3.5 3.3*  CL 93* 88*  CO2 24 22  GLUCOSE 103* 119*  BUN 26* 27*  CREATININE 1.4* 0.95  CALCIUM 8.4 8.2*   Liver Function Tests:  Recent Labs Lab 04/18/14 1933  AST 63*  ALT 50*  ALKPHOS 40  BILITOT 0.5  PROT 5.5*  ALBUMIN 2.1*   No results for input(s): LIPASE, AMYLASE in  the last 168 hours. No results for input(s): AMMONIA in the last 168 hours. CBC:  Recent Labs Lab 04/12/14 1114 04/18/14 1933  WBC 14.5* 11.9*  NEUTROABS 13.0*  --   HGB 13.8 14.6  HCT 41.5 41.6  MCV 90.6 86.3  PLT 398.0 168   Cardiac Enzymes: No results for input(s): CKTOTAL, CKMB, CKMBINDEX, TROPONINI in the last 168 hours.  BNP (last 3 results) No results for input(s): PROBNP in the last 8760 hours. CBG: No results for input(s): GLUCAP in the last 168 hours.  Radiological Exams on Admission: Dg Chest 2 View  04/18/2014   CLINICAL DATA:  Shortness of breath and chest  pain  EXAM: CHEST  2 VIEW  COMPARISON:  04/06/2014  FINDINGS: Cardiac shadow is stable. There is been improved aeration in the bases bilaterally when compare with the prior exam. Some residual effusion is noted particularly on the right. No acute bony abnormality is seen.  IMPRESSION: Improved aeration in the bases bilaterally although the persistent changes of effusion and infiltrate are seen.   Electronically Signed   By: Inez Catalina M.D.   On: 04/18/2014 21:14      Assessment/Plan Active Problems:   Dehydration   Hypovolemia dehydration   Hypertension   Hyperlipidemia   Hypovolemia due to dehydration   1. Hypovolemia due to dehydration -she was nmoted to the hypotensive in teh ED -given fluid boluses and responded to the fluid -will continue with IVF -urine cultures also sent  2. Hyponatremia -continue with IVF as ordered -monitor labs  3. Residual Pneumonia with pleural effusion -had recent therapy as an outpatient -CXR does look improved but there is some residual infiltrate with effusion noted -will start on rocephin and monitor CXR  4. Hypertension -will hold medications for now due to lower pressures -will monitor blood pressure  5. Hyperlipidemia -will continue with home medications     Code Status: Full Code (must indicate code status--if unknown or must be presumed, indicate  so) DVT Prophylaxis:Heparin Family Communication: Son (indicate person spoken with, if applicable, with phone number if by telephone) Disposition Plan: Home (indicate anticipated LOS)  Time spent: 46min  KHAN,SAADAT A [facility] Pager [phone]

## 2014-04-18 NOTE — ED Notes (Signed)
The patient is unable to give an urine specimen at this time. The patient refused the in/out cath. The tech has reported to the RN IN CHARGE.

## 2014-04-19 ENCOUNTER — Encounter (HOSPITAL_COMMUNITY): Payer: Self-pay | Admitting: *Deleted

## 2014-04-19 DIAGNOSIS — D696 Thrombocytopenia, unspecified: Secondary | ICD-10-CM | POA: Diagnosis present

## 2014-04-19 DIAGNOSIS — E440 Moderate protein-calorie malnutrition: Secondary | ICD-10-CM | POA: Diagnosis present

## 2014-04-19 DIAGNOSIS — E871 Hypo-osmolality and hyponatremia: Secondary | ICD-10-CM

## 2014-04-19 LAB — COMPREHENSIVE METABOLIC PANEL
ALT: 42 U/L — AB (ref 0–35)
ANION GAP: 14 (ref 5–15)
AST: 52 U/L — ABNORMAL HIGH (ref 0–37)
Albumin: 1.8 g/dL — ABNORMAL LOW (ref 3.5–5.2)
Alkaline Phosphatase: 35 U/L — ABNORMAL LOW (ref 39–117)
BUN: 24 mg/dL — ABNORMAL HIGH (ref 6–23)
CO2: 23 mEq/L (ref 19–32)
Calcium: 7.5 mg/dL — ABNORMAL LOW (ref 8.4–10.5)
Chloride: 93 mEq/L — ABNORMAL LOW (ref 96–112)
Creatinine, Ser: 0.94 mg/dL (ref 0.50–1.10)
GFR calc non Af Amer: 58 mL/min — ABNORMAL LOW (ref >90)
GFR, EST AFRICAN AMERICAN: 67 mL/min — AB (ref >90)
GLUCOSE: 109 mg/dL — AB (ref 70–99)
Potassium: 3.3 mEq/L — ABNORMAL LOW (ref 3.7–5.3)
SODIUM: 130 meq/L — AB (ref 137–147)
TOTAL PROTEIN: 4.8 g/dL — AB (ref 6.0–8.3)
Total Bilirubin: 0.4 mg/dL (ref 0.3–1.2)

## 2014-04-19 LAB — CBC
HEMATOCRIT: 37 % (ref 36.0–46.0)
Hemoglobin: 12.7 g/dL (ref 12.0–15.0)
MCH: 30.2 pg (ref 26.0–34.0)
MCHC: 34.3 g/dL (ref 30.0–36.0)
MCV: 87.9 fL (ref 78.0–100.0)
Platelets: 145 10*3/uL — ABNORMAL LOW (ref 150–400)
RBC: 4.21 MIL/uL (ref 3.87–5.11)
RDW: 13.6 % (ref 11.5–15.5)
WBC: 8.9 10*3/uL (ref 4.0–10.5)

## 2014-04-19 LAB — GLUCOSE, CAPILLARY: Glucose-Capillary: 100 mg/dL — ABNORMAL HIGH (ref 70–99)

## 2014-04-19 LAB — MAGNESIUM: Magnesium: 1.9 mg/dL (ref 1.5–2.5)

## 2014-04-19 LAB — TSH: TSH: 1.5 u[IU]/mL (ref 0.350–4.500)

## 2014-04-19 LAB — HEMOGLOBIN A1C
HEMOGLOBIN A1C: 5.9 % — AB (ref <5.7)
Mean Plasma Glucose: 123 mg/dL — ABNORMAL HIGH (ref <117)

## 2014-04-19 MED ORDER — CEFTRIAXONE SODIUM IN DEXTROSE 20 MG/ML IV SOLN
1.0000 g | INTRAVENOUS | Status: DC
  Administered 2014-04-19 – 2014-04-22 (×4): 1 g via INTRAVENOUS
  Filled 2014-04-19 (×4): qty 50

## 2014-04-19 MED ORDER — ENSURE COMPLETE PO LIQD
237.0000 mL | ORAL | Status: DC
  Administered 2014-04-19: 237 mL via ORAL

## 2014-04-19 MED ORDER — CETYLPYRIDINIUM CHLORIDE 0.05 % MT LIQD
7.0000 mL | Freq: Two times a day (BID) | OROMUCOSAL | Status: DC
  Administered 2014-04-19 – 2014-04-23 (×7): 7 mL via OROMUCOSAL

## 2014-04-19 MED ORDER — ENSURE COMPLETE PO LIQD: 237.0000 mL | Freq: Two times a day (BID) | ORAL | Status: DC

## 2014-04-19 MED ORDER — POTASSIUM CHLORIDE CRYS ER 20 MEQ PO TBCR: 40.0000 meq | EXTENDED_RELEASE_TABLET | ORAL | Status: DC | PRN

## 2014-04-19 MED ORDER — POLYETHYLENE GLYCOL 3350 17 G PO PACK
17.0000 g | PACK | Freq: Every day | ORAL | Status: DC | PRN
  Administered 2014-04-22: 17 g via ORAL
  Filled 2014-04-19 (×2): qty 1

## 2014-04-19 NOTE — Progress Notes (Addendum)
UR completed Kainan Patty K. Dinna Severs, RN, BSN, [place], CCM  04/19/2014 3:05 PM

## 2014-04-19 NOTE — Care Management Note (Addendum)
    Page 1 of 2   04/23/2014     5:10:31 PM CARE MANAGEMENT NOTE 04/23/2014  Patient:  Carmen Duncan, Carmen Duncan   Account Number:  [id number]  Date Initiated:  04/19/2014  Documentation initiated by:  HUTCHINSON,CRYSTAL  Subjective/Objective Assessment:   SOB, dehydration, Hypovolemia d/t dehydration, Residual Pneumonia with pleural effusion     Action/Plan:   CM to follow for dispostion needs   Anticipated DC Date:  04/25/2014   Anticipated DC Plan:  [place]  In-house referral  Clinical Social Worker      DC Forensic scientist  CM consult      [facility] Choice  HOME HEALTH   Choice offered to / List presented to:  C-1 Patient   DME arranged  [place]  3-N-1      DME agency  [place] arranged  HH-1 RN  [place]      [place].   Status of service:  Completed, signed off Medicare Important Message given?  YES (If response is "NO", the following Medicare IM given date fields will be blank) Date Medicare IM given:  04/22/2014 Medicare IM given by:  AMERSON,JULIE Date Additional Medicare IM given:   Additional Medicare IM given by:    Discharge Disposition:  [place]  Per UR Regulation:  Reviewed for med. necessity/level of care/duration of stay  If discussed at [place] of Stay Meetings, dates discussed:    Comments:  04/23/14 17:05 CM met with pt in room to offer choice of home health agency. Pt chooses [facility] to render home health physical and occupational therapy, nurse, and aide. Address and contact information verified with pt.  Referral called to [facility] rep, Stephanie.  CM called DME rep, Germaine to please deliver 3n1 and rolling walker to room prior to discharge. No other CM needs were ocmmunicated.  Mariane Masters, BSN, [place].   04/22/14 Ellan [place], RN, BSN [phone] SNF stay will need insurance authorization, per CSW, and agency closed  today due to Thanksgiving holiday. CSW to follow up with pt/son and MD on bed offers and time frame for dc.  04/22/14 Ellan [place], RN, BSN [phone] PT now recommending SNF with 24h supervision.  Son and dtr in law work, and are unable to provide 24hr supervision. Sat down with pt and son, and discussed all options for dc, including home with private duty sitters and [facility] care, vs. SNF for rehab.  Pt tearful, but understands she likely needs SNF at this time until she gets her strength back up. Pt originally from [place], and has friends there, while family lives in [place].  Pt more familiar with [place] SNFs, and seems more comfortable with placement in this area so her friends can visit her.  Son agreeable to this plan.  She prefers [facility], [facility], or [facility], if possible.  Will pass info to [place], [place]. Will follow.   Crystal Hutchinson RN, BSN, MSHL, CCM  Nurse - Case Manager,  ([facility] [place])  [phone]  04/19/2014

## 2014-04-19 NOTE — Plan of Care (Signed)
Problem: Phase I Progression Outcomes Goal: Pain controlled with appropriate interventions Outcome: Completed/Met Date Met:  04/19/14 Goal: Other Phase I Outcomes/Goals Outcome: Not Applicable Date Met:  04/19/14     

## 2014-04-19 NOTE — Progress Notes (Signed)
[facility] Progress Note   Carmen Duncan:[id number] DOB: 04-Jun-1940 DOA: 04/18/2014 PCP: [name], MD  Brief narrative: Carmen Duncan is a 73 y.o. female with a past medical history of asthma, hypertension and GERD who was recently treated for bilateral pneumonia with Levaquin. She comes in to the hospital with increasing weakness and a feeling that her pneumonia may be coming back. She admits to having a cough and some chills. It appears, according to the last office note that she has a history of hyponatremia as well. Her recent lab work shows that sodium was 127 on the 11/17 - she presented yesterday with a sodium of 125. Prior to this sodium has been in the low 130s.   Subjective: Continues to complain of generalized weakness and very little by mouth intake. Has a mild cough which is nonproductive. No longer having chills.  Assessment/Plan: Principal Problem:   Hyponatremia-   Hypovolemia due to dehydration -Possibly secondary to poor intake in conjunction with HCTZ -Continue normal saline and continue to hold HCTZ-sodium has improved to 130 today  Active Problems: Pneumonia -Apparently was treated and she finished a course of Levaquin at a dose of 500 mg daily. At this time I am not certain that she is having a recurrence as she does not have significant symptoms- her cough may be residual. However she does admit to beginning to have chills once again and therefore we'll continue to treat--she was started on Rocephin on admission yesterday which I will continue for now. -Chest x-ray does show residual infiltrate and there are coarse crackles on exam bilaterally    Hypertension -Continue propranolol -Holding lisinopril/HCTZ  Hypokalemia -We'll replace-magnesium level normal at 1.9    Hyperlipidemia -Resume statin    Thrombocytopenia -Appears acute and may be a sign of continuing infection-     Malnutrition of moderate degree -Nutritional supplements  added   Code Status: Full code Family Communication:  Disposition Plan: To be determined DVT prophylaxis: Heparin  Consultants: none  Procedures: none  Antibiotics: Anti-infectives    Start     Dose/Rate Route Frequency Ordered Stop   04/19/14 0030  cefTRIAXone (ROCEPHIN) 1 g in dextrose 5 % 50 mL IVPB - Premix     1 g100 mL/hr over 30 Minutes Intravenous Every 24 hours 04/19/14 0008        Objective: Filed Weights   04/18/14 1920 04/18/14 2316 04/19/14 0506  Weight: 62.596 kg (138 lb) 65.318 kg (144 lb) 64.91 kg (143 lb 1.6 oz)    Intake/Output Summary (Last 24 hours) at 04/19/14 1651 Last data filed at 04/19/14 1413  Gross per 24 hour  Intake   1563 ml  Output     15 ml  Net   1548 ml     Vitals Filed Vitals:   04/18/14 2206 04/18/14 2316 04/19/14 0506 04/19/14 1532  BP: 101/44 114/32 110/43 115/41  Pulse: 109 108 83 85  Temp:  98.3 F (36.8 C)  98.3 F (36.8 C)  TempSrc:  Oral  Oral  Resp: 18 18 16 18   Height:      Weight:  65.318 kg (144 lb) 64.91 kg (143 lb 1.6 oz)   SpO2: 93% 93% 93% 92%    Exam: General: Awake alert oriented 3 No acute respiratory distress Lungs: Mild crackles at bilateral bases Cardiovascular: Regular rate and rhythm without murmur gallop or rub normal S1 and S2 Abdomen: Nontender, nondistended, soft, bowel sounds positive, no rebound, no ascites, no appreciable mass Extremities: No  significant cyanosis, clubbing, or edema bilateral lower extremities  Data Reviewed: Basic Metabolic Panel:  Recent Labs Lab 04/18/14 1933 04/19/14 0410  NA 125* 130*  K 3.3* 3.3*  CL 88* 93*  CO2 22 23  GLUCOSE 119* 109*  BUN 27* 24*  CREATININE 0.95 0.94  CALCIUM 8.2* 7.5*  MG  --  1.9   Liver Function Tests:  Recent Labs Lab 04/18/14 1933 04/19/14 0410  AST 63* 52*  ALT 50* 42*  ALKPHOS 40 35*  BILITOT 0.5 0.4  PROT 5.5* 4.8*  ALBUMIN 2.1* 1.8*   No results for input(s): LIPASE, AMYLASE in the last 168 hours. No results  for input(s): AMMONIA in the last 168 hours. CBC:  Recent Labs Lab 04/18/14 1933 04/19/14 0410  WBC 11.9* 8.9  HGB 14.6 12.7  HCT 41.6 37.0  MCV 86.3 87.9  PLT 168 145*   Cardiac Enzymes: No results for input(s): CKTOTAL, CKMB, CKMBINDEX, TROPONINI in the last 168 hours. BNP (last 3 results) No results for input(s): PROBNP in the last 8760 hours. CBG:  Recent Labs Lab 04/19/14 0634  GLUCAP 100*    No results found for this or any previous visit (from the past 240 hour(s)).   Studies:  Recent x-ray studies have been reviewed in detail by the Attending Physician  Scheduled Meds:  Scheduled Meds: . acidophilus  1 capsule Oral QHS  . antiseptic oral rinse  7 mL Mouth Rinse BID  . aspirin EC  81 mg Oral Daily  . atorvastatin  20 mg Oral Daily  . cefTRIAXone (ROCEPHIN)  IV  1 g Intravenous Q24H  . [START ON 04/20/2014] feeding supplement (ENSURE COMPLETE)  237 mL Oral BID BM  . folic acid  1 mg Oral Daily  . heparin  5,000 Units Subcutaneous 3 times per day  . multivitamin with minerals  1 tablet Oral Daily  . nystatin  5 mL Oral QID  . pantoprazole  40 mg Oral Daily  . propranolol  40 mg Oral QHS  . sodium chloride  3 mL Intravenous Q12H  . thiamine  100 mg Oral Daily  . topiramate  100 mg Oral QHS   Continuous Infusions: . sodium chloride 75 mL/hr at 04/19/14 0247    Time spent on care of this patient: 40 min   [place], MD 04/19/2014, 4:51 PM  LOS: 1 day   [facility] Office  [phone] Pager - Text Page per [email or web address]  If 7PM-7AM, please contact night-coverage [email or web address]

## 2014-04-19 NOTE — Progress Notes (Signed)
INITIAL NUTRITION ASSESSMENT  DOCUMENTATION CODES Per approved criteria  -Non-severe (moderate) malnutrition in the context of acute illness or injury  Pt meets criteria for MODERATE (NON-SEVERE) MALNUTRITION in the context of ACUTE ILLNESS as evidenced by estimated energy intake <75% of estimated energy needs for > 7 days, mild wasting of muscle mass, and mild wasting of subcutaneous fat.  INTERVENTION: Increase Ensure Complete to BID, each supplement provides 350 kcal and 13 grams of protein Provide Snacks TID Continue Multivitamin with minerals daily   NUTRITION DIAGNOSIS: Inadequate oral intake related to poor appetite as evidenced by <25% meal completion and patient's report.   Goal: Pt to meet >/= 90% of their estimated nutrition needs   Monitor:  PO intake, weight trend, labs  Reason for Assessment: Consult, Poor PO intake  73 y.o. female  Admitting Dx: Hyponatremia  ASSESSMENT: 73 y.o. female presents with weakness. She states taht she was recently treated for double pneumonia as an outpatient. Patient was given levaquin as an outpatient. Patient states that she has had no energy and has felt extremely tired and fatigued. She has not been taking much fluids or eating well. Patient has no nausea or vomiting.   Patient reports having a decreased appetite and eating less than usual for the past month. Per patient's son at bedside, patients PO intake has been particularly worse for the past 2 weeks. Patient ate about 25% of her lunch and 0% of her breakfast. Patient states she is getting full fast and having reflux after meals. Patient reports weighing 142 lbs prior to getting sick.  RD discussed ways to improve calorie and protein intake while appetite is poor. She is agreeable to receiving Ensure Complete and snacks daily.  Labs: low potassium, low sodium, low calcium  Nutrition Focused Physical Exam:  Subcutaneous Fat:  Orbital Region: wnl Upper Arm Region: mild  wasting Thoracic and Lumbar Region: NA  Muscle:  Temple Region: mild wasting Clavicle Bone Region: mild wasting Clavicle and Acromion Bone Region: mild wasting Scapular Bone Region: NA Dorsal Hand: moderate wasting Patellar Region: wnl Anterior Thigh Region: wnl Posterior Calf Region: wnl  Edema: none noted   Height: Ht Readings from Last 1 Encounters:  04/18/14 5\' 1"  (1.549 m)    Weight: Wt Readings from Last 1 Encounters:  04/19/14 143 lb 1.6 oz (64.91 kg)    Ideal Body Weight: 105 lbs  % Ideal Body Weight: 136%  Wt Readings from Last 10 Encounters:  04/19/14 143 lb 1.6 oz (64.91 kg)  04/12/14 140 lb (63.504 kg)  04/08/14 142 lb (64.411 kg)  04/06/14 141 lb (63.957 kg)  04/04/14 145 lb 12.8 oz (66.134 kg)  03/30/14 140 lb (63.504 kg)  03/21/14 144 lb (65.318 kg)  02/03/14 142 lb (64.411 kg)  01/20/14 142 lb (64.411 kg)  10/07/13 143 lb (64.864 kg)    Usual Body Weight: 142 lbs  % Usual Body Weight: 100%  BMI:  Body mass index is 27.05 kg/(m^2).  Estimated Nutritional Needs: Kcal: 1550-1700 Protein: 75-90 grams Fluid: 1.7 L/day  Skin: intact  Diet Order: Diet Heart  EDUCATION NEEDS: -No education needs identified at this time   Intake/Output Summary (Last 24 hours) at 04/19/14 1250 Last data filed at 04/19/14 0932  Gross per 24 hour  Intake   1323 ml  Output     15 ml  Net   1308 ml    Last BM: PTA   Labs:   Recent Labs Lab 04/18/14 1933 04/19/14 0410  NA 125*  130*  K 3.3* 3.3*  CL 88* 93*  CO2 22 23  BUN 27* 24*  CREATININE 0.95 0.94  CALCIUM 8.2* 7.5*  MG  --  1.9  GLUCOSE 119* 109*    CBG (last 3)   Recent Labs  04/19/14 0634  GLUCAP 100*    Scheduled Meds: . acidophilus  1 capsule Oral QHS  . antiseptic oral rinse  7 mL Mouth Rinse BID  . aspirin EC  81 mg Oral Daily  . atorvastatin  20 mg Oral Daily  . cefTRIAXone (ROCEPHIN)  IV  1 g Intravenous Q24H  . feeding supplement (ENSURE COMPLETE)  237 mL Oral Q24H   . folic acid  1 mg Oral Daily  . heparin  5,000 Units Subcutaneous 3 times per day  . multivitamin with minerals  1 tablet Oral Daily  . nystatin  5 mL Oral QID  . pantoprazole  40 mg Oral Daily  . propranolol  40 mg Oral QHS  . sodium chloride  3 mL Intravenous Q12H  . thiamine  100 mg Oral Daily  . topiramate  100 mg Oral QHS    Continuous Infusions: . sodium chloride 75 mL/hr at 04/19/14 0247    Past Medical History  Diagnosis Date  . Arthritis   . Asthma   . Hypertension   . GERD (gastroesophageal reflux disease)   . Hyperlipidemia   . Diverticulosis   . IBS (irritable bowel syndrome)   . HOH (hard of hearing) left ear  . Acoustic neuroma     Past Surgical History  Procedure Laterality Date  . Cholecystectomy  2011  . Abdominal hysterectomy  1980  . Tonsillectomy  1945  . Biopsy breast    . Eyes  2007    cararacts    Pryor Ochoa RD, LDN Inpatient Clinical Dietitian Pager: [phone] After Hours Pager: [phone]

## 2014-04-19 NOTE — Plan of Care (Signed)
Problem: Phase I Progression Outcomes Goal: OOB as tolerated unless otherwise ordered Outcome: Progressing Goal: Initial discharge plan identified Outcome: Progressing Goal: Voiding-avoid urinary catheter unless indicated Outcome: Progressing Goal: Hemodynamically stable Outcome: Progressing

## 2014-04-20 ENCOUNTER — Telehealth: Payer: Self-pay | Admitting: Family Medicine

## 2014-04-20 ENCOUNTER — Ambulatory Visit: Payer: Medicare Other | Admitting: Family Medicine

## 2014-04-20 ENCOUNTER — Inpatient Hospital Stay (HOSPITAL_COMMUNITY): Payer: Medicare Other

## 2014-04-20 DIAGNOSIS — E440 Moderate protein-calorie malnutrition: Secondary | ICD-10-CM

## 2014-04-20 DIAGNOSIS — J849 Interstitial pulmonary disease, unspecified: Secondary | ICD-10-CM

## 2014-04-20 DIAGNOSIS — I369 Nonrheumatic tricuspid valve disorder, unspecified: Secondary | ICD-10-CM

## 2014-04-20 DIAGNOSIS — J189 Pneumonia, unspecified organism: Secondary | ICD-10-CM

## 2014-04-20 LAB — OSMOLALITY, URINE: Osmolality, Ur: 492 mosm/kg (ref 390–1090)

## 2014-04-20 LAB — URINE CULTURE
Colony Count: NO GROWTH
Culture: NO GROWTH

## 2014-04-20 LAB — BASIC METABOLIC PANEL
Anion gap: 10 (ref 5–15)
BUN: 15 mg/dL (ref 6–23)
CHLORIDE: 97 meq/L (ref 96–112)
CO2: 22 meq/L (ref 19–32)
CREATININE: 0.8 mg/dL (ref 0.50–1.10)
Calcium: 7.6 mg/dL — ABNORMAL LOW (ref 8.4–10.5)
GFR calc Af Amer: 82 mL/min — ABNORMAL LOW (ref >90)
GFR calc non Af Amer: 70 mL/min — ABNORMAL LOW (ref >90)
GLUCOSE: 94 mg/dL (ref 70–99)
Potassium: 3.7 mEq/L (ref 3.7–5.3)
Sodium: 129 mEq/L — ABNORMAL LOW (ref 137–147)

## 2014-04-20 LAB — CBC
HEMATOCRIT: 36.9 % (ref 36.0–46.0)
HEMOGLOBIN: 12.5 g/dL (ref 12.0–15.0)
MCH: 30 pg (ref 26.0–34.0)
MCHC: 33.9 g/dL (ref 30.0–36.0)
MCV: 88.7 fL (ref 78.0–100.0)
Platelets: 129 10*3/uL — ABNORMAL LOW (ref 150–400)
RBC: 4.16 MIL/uL (ref 3.87–5.11)
RDW: 13.5 % (ref 11.5–15.5)
WBC: 7.8 10*3/uL (ref 4.0–10.5)

## 2014-04-20 LAB — PRO B NATRIURETIC PEPTIDE: Pro B Natriuretic peptide (BNP): 2447 pg/mL — ABNORMAL HIGH (ref 0–450)

## 2014-04-20 LAB — GLUCOSE, CAPILLARY: GLUCOSE-CAPILLARY: 96 mg/dL (ref 70–99)

## 2014-04-20 LAB — SODIUM, URINE, RANDOM: Sodium, Ur: 23 mEq/L

## 2014-04-20 LAB — OSMOLALITY: OSMOLALITY: 269 mosm/kg — AB (ref 275–300)

## 2014-04-20 MED ORDER — ENSURE COMPLETE PO LIQD
237.0000 mL | Freq: Three times a day (TID) | ORAL | Status: DC
  Administered 2014-04-20 – 2014-04-23 (×9): 237 mL via ORAL

## 2014-04-20 MED ORDER — CAMPHOR-MENTHOL 0.5-0.5 % EX LOTN
TOPICAL_LOTION | Freq: Three times a day (TID) | CUTANEOUS | Status: DC | PRN
  Administered 2014-04-20 (×2): via TOPICAL
  Administered 2014-04-23: 1 via TOPICAL
  Filled 2014-04-20: qty 222

## 2014-04-20 MED ORDER — SODIUM CHLORIDE 0.9 % IV BOLUS (SEPSIS)
500.0000 mL | Freq: Once | INTRAVENOUS | Status: AC
  Administered 2014-04-20: 500 mL via INTRAVENOUS

## 2014-04-20 MED ORDER — DRONABINOL 2.5 MG PO CAPS
2.5000 mg | ORAL_CAPSULE | Freq: Two times a day (BID) | ORAL | Status: DC
  Administered 2014-04-20 – 2014-04-23 (×7): 2.5 mg via ORAL
  Filled 2014-04-20 (×8): qty 1

## 2014-04-20 MED ORDER — PROPRANOLOL HCL 10 MG PO TABS
10.0000 mg | ORAL_TABLET | Freq: Every day | ORAL | Status: DC
  Administered 2014-04-20 – 2014-04-22 (×3): 10 mg via ORAL
  Filled 2014-04-20 (×4): qty 1

## 2014-04-20 MED ORDER — DEXTROSE 5 % IV SOLN
500.0000 mg | INTRAVENOUS | Status: DC
  Administered 2014-04-20 – 2014-04-21 (×2): 500 mg via INTRAVENOUS
  Filled 2014-04-20 (×2): qty 500

## 2014-04-20 NOTE — Progress Notes (Signed)
Pt.is A/Ox4 and is ambulatory with 1 person assist with walker. Pt.had no c/o pain and no signs of distress. Bladder scan was done during the shift and greatest amount was 140 mL. MD called and notified. Pt.voided later during the shift. She worked with PT today.

## 2014-04-20 NOTE — Telephone Encounter (Signed)
FYI Pt son is calling to let md know his mother is inpatient at [facility].

## 2014-04-20 NOTE — Telephone Encounter (Signed)
Noted. Dr. B is aware.

## 2014-04-20 NOTE — Consult Note (Signed)
Name: Carmen Duncan MRN: [id number] DOB: [birth year]    ADMISSION DATE:  04/18/2014 CONSULTATION DATE:  11/25  REFERRING MD :  Wynelle Cleveland  PCP - Burchette   CHIEF COMPLAINT:  PNA   BRIEF PATIENT DESCRIPTION: 73yo female with hx asthma, HTN recent outpt rx for CAP (which was dx at return OV for continued SOB/DOE, already being w/u by PCP and cards).  Returned 11/23 with c/o continued SOB, cough, weakness, poor PO intake.  Admitted by [facility] with residual PNA, hyponatremia, dehydration.  PCCM asked to evaluate for non-resolving infiltrates.   SIGNIFICANT EVENTS  11/11 outpt OV, bibasilar CAP (v edema), rx levaquin x 10 days   STUDIES:  11/5>>outpt nuc med stress test>>LOW risk CT chest 11/25>>> 2D echo 11/25>>>   HISTORY OF PRESENT ILLNESS:  73yo female with hx asthma, HTN, recent outpt rx for CAP.  Of note was already getting w/u for progressive DOE which had been a problem for several months prior to CAP dx.  Developed fever and was dx with CAP 11/11 r/t bibasilar infiltrates.  Rx with levaquin x 10 days (completed 11/21).  She continued to have SOB, cough and was seen 2 additional times as outpt.  She ultimately sought care in the ER 11/23 with c/o continued SOB, cough, weakness, poor PO intake. Admitted by [facility] with PNA, hyponatremia and dehydration.  Cont to have vague c/o.  C/o weakness, fatigue, malaise, mild SOB, worse with exertion, mild nonproductive cough.  Denies fevers/chills last 24 hours.  Denies chest pian, orthopnea, hemoptysis, PND, lef/calf pain, recent sick contacts.   PAST MEDICAL HISTORY :   has a past medical history of Arthritis; Asthma; Hypertension; GERD (gastroesophageal reflux disease); Hyperlipidemia; Diverticulosis; IBS (irritable bowel syndrome); HOH (hard of hearing) (left ear); and Acoustic neuroma.  has past surgical history that includes Cholecystectomy (2011); Abdominal hysterectomy (1980); Tonsillectomy (1945); Biopsy breast; and eyes (2007). Prior to  Admission medications   Medication Sig Start Date End Date Taking? Authorizing Provider  acetaminophen (TYLENOL) 325 MG tablet Take 650 mg by mouth every 6 (six) hours as needed for headache.   Yes Historical Provider, MD  aspirin 81 MG tablet Take 81 mg by mouth daily.     Yes Historical Provider, MD  atorvastatin (LIPITOR) 20 MG tablet Take 1 tablet (20 mg total) by mouth daily. 09/28/13  Yes [name], MD  cetirizine (ZYRTEC) 10 MG tablet Take 10 mg by mouth daily.   Yes Historical Provider, MD  clidinium-chlordiazePOXIDE (LIBRAX) 5-2.5 MG per capsule Take 1 capsule by mouth 2 (two) times daily as needed. Pt only taking prn due to cost 10/22/13  Yes [name], MD  lisinopril-hydrochlorothiazide (PRINZIDE,ZESTORETIC) 20-12.5 MG per tablet Take 1 tablet by mouth daily. 10/20/13  Yes [name], MD  nystatin (MYCOSTATIN) 100000 UNIT/ML suspension Take 5 mLs (500,000 Units total) by mouth 4 (four) times daily. 04/12/14  Yes [name], MD  omeprazole (PRILOSEC OTC) 20 MG tablet Take 20 mg by mouth daily.    Yes Historical Provider, MD  Oxygen Permeable Lens Products (OPTIMUM WETTING/REWETTING DROP) SOLN Place 1 drop into both eyes as needed (for dry eyes).   Yes Historical Provider, MD  Probiotic Product (ALIGN) 4 MG CAPS Take 4 mg by mouth at bedtime.    Yes Historical Provider, MD  propranolol (INDERAL) 40 MG tablet Take 1 tablet (40 mg total) by mouth 2 (two) times daily. Patient taking differently: Take 40 mg by mouth at bedtime.  04/04/14  Yes  [name], MD  topiramate (TOPAMAX) 50 MG tablet Take 2 tablets (100 mg total) by mouth at bedtime. 01/12/14  Yes Rebecca S Tat, DO  UNABLE TO FIND Place 1 application into the nose as needed (for sinuses). Milta Deiters med sinus rinse   Yes Historical Provider, MD  levofloxacin (LEVAQUIN) 500 MG tablet Take 1 tablet (500 mg total) by mouth daily. Patient not taking: Reported on 04/18/2014 04/06/14   [name], MD    Allergies  Allergen Reactions  . Latex Itching, Rash and Other (See Comments)    Red angry skin from latex tape  . Sulfonamide Derivatives Rash    hives  . Zocor [Simvastatin] Nausea Only    FAMILY HISTORY:  family history includes Heart disease (age of onset: 18) in her father; Hyperlipidemia in her father and mother; Hypertension in her mother; Stroke in her father. SOCIAL HISTORY:  reports that she has never smoked. She has never used smokeless tobacco. She reports that she drinks alcohol. She reports that she does not use illicit drugs.  REVIEW OF SYSTEMS:   As per HPI - All other systems reviewed and were neg.    SUBJECTIVE:   VITAL SIGNS: Temp:  [98.3 F (36.8 C)-99.3 F (37.4 C)] 99.2 F (37.3 C) (11/25 0458) Pulse Rate:  [83-88] 83 (11/25 1046) Resp:  [16-18] 18 (11/25 0458) BP: (104-115)/(41-56) 104/56 mmHg (11/25 1046) SpO2:  [92 %-97 %] 93 % (11/25 1046) Weight:  [142 lb 12.8 oz (64.774 kg)] 142 lb 12.8 oz (64.774 kg) (11/25 0458)  PHYSICAL EXAMINATION: General:  Pleasant female, NAD  Neuro:  Awake, alert, appropriate, MAE  HEENT:  Mm moist, no JVD  Cardiovascular:  s1s2 rrr  Lungs:  resps even, non labored on RA, few bibasilar rales R>L, no audible wheeze  Abdomen:  Soft, +bs Musculoskeletal:  Warm and dry, no sig edema    Recent Labs Lab 04/18/14 1933 04/19/14 0410 04/20/14 0441  NA 125* 130* 129*  K 3.3* 3.3* 3.7  CL 88* 93* 97  CO2 22 23 22   BUN 27* 24* 15  CREATININE 0.95 0.94 0.80  GLUCOSE 119* 109* 94    Recent Labs Lab 04/18/14 1933 04/19/14 0410 04/20/14 0441  HGB 14.6 12.7 12.5  HCT 41.6 37.0 36.9  WBC 11.9* 8.9 7.8  PLT 168 145* 129*   Dg Chest 2 View  04/18/2014   CLINICAL DATA:  Shortness of breath and chest pain  EXAM: CHEST  2 VIEW  COMPARISON:  04/06/2014  FINDINGS: Cardiac shadow is stable. There is been improved aeration in the bases bilaterally when compare with the prior exam. Some residual effusion is noted  particularly on the right. No acute bony abnormality is seen.  IMPRESSION: Improved aeration in the bases bilaterally although the persistent changes of effusion and infiltrate are seen.   Electronically Signed   By: Inez Catalina M.D.   On: 04/18/2014 21:14    ASSESSMENT / PLAN:  CAP -- non-resolving infiltrates, ongoing SOB, cough.  WBC now wnl, afebrile.  Infiltrates do seem improved from 11/11->11/23.   REC -  Agree with CT chest  Check BNP  2D echo Sputum culture if able  Agree with speech eval  Cont rocephin/azithro-- seems improved overall with resolved leukocytosis, afebrile, unimpressive pct - would not add any additional coverage Aggressive pulm hygiene as able  PRN BD    Hyponatremia  Hypovolemia   Per primary   Discussed with pt and son [facility] pharmacist) at length at  bedside.   Nickolas Madrid, NP 04/20/2014  12:21 PM Pager: [phone] or [phone]  ATTENDING NOTE: I have personally reviewed patient's available data, including medical history, events of note, physical examination and test results as part of my evaluation. I have discussed with resident/NP and other careteam providers such as pharmacist, RN and RRT & co-ordinated with consultants. In addition, I personally evaluated patient and elicited key history of dyspnea on exertion for several months with occasional dry cough, acute fevers with sputum production for the past 2 weeks, treated with Levaquin 10 days as an outpatient by PCP, recent cardiac evaluation by Dr. Ron Parker , history of Raynaud phenomenon exam findings of bilateral coarse crackles at bases, no pedal edema or JVD & labs showing hyponatremia (chronic), absence of leukocytosis.  Imagings studies were reviewed- bibasal infiltrates seem to have improved from 11/11, these are new when compared to old chest x-ray from 2011. Differential diagnosis includes bibasal pneumonia, underlying interstitial lung disease-very likely IPF in this age group,  bronchiectasis also possible. We'll proceed with high resolution CT scan without contrast to further define this Rest per NP/medical resident whose note is outlined above and that I agree with and edited in full.   I discussed about recommendations with the patient and her son, would continue antibiotics in the meantime, but limited to 5 days or low pro-calcitonin.  Rigoberto Noel MD  [phone]

## 2014-04-20 NOTE — Progress Notes (Addendum)
[facility] Progress Note   HERO MCCATHERN EHM:[id number] DOB: [birth year] DOA: 04/18/2014 PCP: [name], MD  Brief narrative: Carmen Duncan is a 73 y.o. female with a past medical history of asthma, hypertension and GERD who was recently treated for bilateral pneumonia with Levaquin. She comes in to the hospital with increasing weakness and a feeling that her pneumonia may be coming back. She admits to having a cough and some chills. It appears, according to the last office note that she has a history of hyponatremia as well. Her recent lab work shows that sodium was 127 on the 11/17 - she presented yesterday with a sodium of 125. Prior to this sodium has been in the low 130s.   Subjective: Ongoing severe generalized weakness and very little by mouth intake- she did not drink Ensure that I ordered for her yesterday and per son, has not been drinking fluids. Continues to have a congested cough. Son is at bedside and is concerned about her poor by mouth intake and her long-term plans as far as whether she can go home. He states that her bedroom is upstairs and he is concerned that she may not be able to climb the stairs at home.  Assessment/Plan: Principal Problem:   Hyponatremia-   Hypovolemia due to dehydration -Possibly secondary to poor intake in conjunction with HCTZ- may have a component of SIADH due to an underlying acute lung issues- follow up on urine sodium, osmolality and serum osmolality -Continue normal saline and continue to hold HCTZ-sodium 129 today -we will increase normal saline 225 mL as she has barely drank any liquids over the past 24 hours- according to lab work , BUN/creatinine ratio is improving  Active Problems: Pneumonia-bilateral, community-acquired -Apparently was treated and she finished a 10 day course of Levaquin at a dose of 500 mg daily.  -Chest x-ray does show residual infiltrate,  coarse crackles on exam bilaterally and low-grade fevers therefore I  am continuing treatment for pneumonia with Rocephin and Zithromax -I will be ordering a CT of the chest to further evaluate infiltrates and have asked pulmonary to evaluate her as well to comment on appropriateness of current antibiotics and duration of treatment. -Sputum culture ordered -Question whether she has a component of aspiration- Will order a speech eval- hold off on starting anaerobic coverage for now    Hypotension with a history of Hypertension -Continue propranolol but decrease dose from 40 to10 mg daily -Holding lisinopril/HCTZ  Urinary retention -Required an in and out cath yesterday and had about 400 mL of urine in the bladder-has not urinated again will be redoing bladder scan-may need to place Foley  Hypokalemia -Replaced- recheck in a.m.    Hyperlipidemia -Resume statin    Thrombocytopenia -Appears acute and may be a related to acute infection- - monitor closely while on heparin    Malnutrition of moderate degree -Nutritional supplements added   Code Status: Full code Family Communication:  Disposition Plan: To be determined- discussed the possibility of SNF for rehabilitation however patient became tearful and stated that she would not prefer this-PT eval ordered- is at home with her son and may be able to return home DVT prophylaxis: Heparin  Consultants: none  Procedures: none  Antibiotics: Anti-infectives    Start     Dose/Rate Route Frequency Ordered Stop   04/20/14 1000  azithromycin (ZITHROMAX) 500 mg in dextrose 5 % 250 mL IVPB     500 mg250 mL/hr over 60 Minutes Intravenous Every 24 hours 04/20/14  0277     04/19/14 0030  cefTRIAXone (ROCEPHIN) 1 g in dextrose 5 % 50 mL IVPB - Premix     1 g100 mL/hr over 30 Minutes Intravenous Every 24 hours 04/19/14 0008        Objective: Filed Weights   04/18/14 2316 04/19/14 0506 04/20/14 0458  Weight: 65.318 kg (144 lb) 64.91 kg (143 lb 1.6 oz) 64.774 kg (142 lb 12.8 oz)    Intake/Output Summary  (Last 24 hours) at 04/20/14 1059 Last data filed at 04/20/14 0507  Gross per 24 hour  Intake   1315 ml  Output    575 ml  Net    740 ml     Vitals Filed Vitals:   04/19/14 2023 04/20/14 0131 04/20/14 0458 04/20/14 1046  BP: 111/44 105/45 111/51 104/56  Pulse: 88 85 86 83  Temp: 99.3 F (37.4 C) 99.1 F (37.3 C) 99.2 F (37.3 C)   TempSrc: Oral Oral Oral   Resp: 18 16 18    Height:      Weight:   64.774 kg (142 lb 12.8 oz)   SpO2: 97% 92% 92% 93%    Exam: General: Awake alert oriented 3 No acute respiratory distress, he was extremely weak and is very forgetful in regards to yesterday's events Lungs:  crackles at bilateral bases Cardiovascular: Regular rate and rhythm without murmur gallop or rub normal S1 and S2 Abdomen: Nontender, nondistended, soft, bowel sounds positive, no rebound, no ascites, no appreciable mass Extremities: No significant cyanosis, clubbing, or edema bilateral lower extremities  Data Reviewed: Basic Metabolic Panel:  Recent Labs Lab 04/18/14 1933 04/19/14 0410 04/20/14 0441  NA 125* 130* 129*  K 3.3* 3.3* 3.7  CL 88* 93* 97  CO2 22 23 22   GLUCOSE 119* 109* 94  BUN 27* 24* 15  CREATININE 0.95 0.94 0.80  CALCIUM 8.2* 7.5* 7.6*  MG  --  1.9  --    Liver Function Tests:  Recent Labs Lab 04/18/14 1933 04/19/14 0410  AST 63* 52*  ALT 50* 42*  ALKPHOS 40 35*  BILITOT 0.5 0.4  PROT 5.5* 4.8*  ALBUMIN 2.1* 1.8*   No results for input(s): LIPASE, AMYLASE in the last 168 hours. No results for input(s): AMMONIA in the last 168 hours. CBC:  Recent Labs Lab 04/18/14 1933 04/19/14 0410 04/20/14 0441  WBC 11.9* 8.9 7.8  HGB 14.6 12.7 12.5  HCT 41.6 37.0 36.9  MCV 86.3 87.9 88.7  PLT 168 145* 129*   Cardiac Enzymes: No results for input(s): CKTOTAL, CKMB, CKMBINDEX, TROPONINI in the last 168 hours. BNP (last 3 results) No results for input(s): PROBNP in the last 8760 hours. CBG:  Recent Labs Lab 04/19/14 0634 04/20/14 0750   GLUCAP 100* 96    Recent Results (from the past 240 hour(s))  Urine culture     Status: None   Collection Time: 04/18/14 10:20 PM  Result Value Ref Range Status   Specimen Description URINE, RANDOM  Final   Special Requests NONE  Final   Culture  Setup Time   Final    04/18/2014 23:08 Performed at [place] Performed at [facility]   Final   Culture NO GROWTH Performed at [facility]   Final   Report Status 04/20/2014 FINAL  Final     Studies:  Recent x-ray studies have been reviewed in detail by the Attending Physician  Scheduled Meds:  Scheduled Meds: . acidophilus  1 capsule Oral QHS  . antiseptic oral rinse  7 mL Mouth Rinse BID  . aspirin EC  81 mg Oral Daily  . atorvastatin  20 mg Oral Daily  . azithromycin  500 mg Intravenous Q24H  . cefTRIAXone (ROCEPHIN)  IV  1 g Intravenous Q24H  . dronabinol  2.5 mg Oral BID AC  . feeding supplement (ENSURE COMPLETE)  237 mL Oral TID BM  . folic acid  1 mg Oral Daily  . heparin  5,000 Units Subcutaneous 3 times per day  . multivitamin with minerals  1 tablet Oral Daily  . nystatin  5 mL Oral QID  . pantoprazole  40 mg Oral Daily  . propranolol  40 mg Oral QHS  . sodium chloride  3 mL Intravenous Q12H  . thiamine  100 mg Oral Daily  . topiramate  100 mg Oral QHS   Continuous Infusions: . sodium chloride 75 mL/hr at 04/19/14 1808    Time spent on care of this patient: 30 min   [place], MD 04/20/2014, 10:59 AM  LOS: 2 days   [facility] Office  [phone] Pager - Text Page per [email or web address]  If 7PM-7AM, please contact night-coverage [email or web address]

## 2014-04-20 NOTE — Progress Notes (Signed)
  Echocardiogram 2D Echocardiogram has been performed.  Carmen Duncan 04/20/2014, 3:26 PM

## 2014-04-20 NOTE — Progress Notes (Signed)
RT Note: Sputum specimen cup left at bedside, patient instructed to expectorate any secretions into cup. Pt states she understands, RT to monitor.

## 2014-04-20 NOTE — Progress Notes (Signed)
Patient complained of itching rash on back and bottom.  Dr. Baltazar Najjar ordered cream to put on TID PRN.    Patient also complained of feeling like not being able to urinate.  Did a bladder scan it showed 415cc urine.  Did an in and out cath with 375 cc output.  Will continue to monitor for urination

## 2014-04-20 NOTE — Evaluation (Signed)
Physical Therapy Evaluation Patient Details Name: Carmen Duncan MRN: [id number] DOB: [birth year] Today's Date: 04/20/2014   History of Present Illness  73yo female with hx asthma, HTN recent outpt rx for pneumonia.Admitted 11/23 with c/o continued SOB, cough, weakness, poor PO intake. Admitted with residual PNA, hyponatremia, dehydration.  Further work-up in progress  Clinical Impression  Pt admitted for work-up of above. Pt currently with functional limitations due to the deficits listed below (see PT Problem List).  Pt will benefit from skilled PT to increase her independence and safety with mobility to allow discharge to home.  At this time the pt is not interested in DC plan other than home. Discussed with pt and son that at this time the pt would be too weak to be left home alone for any length of time and would be at risk for falls if she was to get up and ambulate on her own.  I encouraged pt to get up OOB and walk with nursing staff several times daily to see if her strength rebounds quickly.  If not, she may need to consider short term rehab and/or use of a walker for safety.  PT will continue to follow to progress mobility as pt able to tolerate.      Follow Up Recommendations Home health PT;Supervision for mobility/OOB (If pt does not progress quickly would likely need SNF or 24 )    Equipment Recommendations  Rolling walker with 5" wheels (may benefit from RW)    Recommendations for Other Services       Precautions / Restrictions Precautions Precautions: Fall Restrictions Weight Bearing Restrictions: No      Mobility  Bed Mobility Overal bed mobility: Modified Independent             General bed mobility comments: used bed rail. Likely would have needed assist if not using rail.  Transfers Overall transfer level: Needs assistance Equipment used: None Transfers: Sit to/from [facility] Sit to Stand: Min assist Stand pivot transfers: Min assist        General transfer comment: Pt held to therapist's arms when performing stand turn transfer.  Ambulation/Gait Ambulation/Gait assistance: Min assist Ambulation Distance (Feet): 30 Feet Assistive device: None Gait Pattern/deviations: Step-through pattern;Decreased stride length Gait velocity: decreased Gait velocity interpretation: Below normal speed for age/gender General Gait Details: pt fatigued very quickly with gait  Stairs Stairs:  (pt too weak to attempt today)          Wheelchair Mobility    Modified Rankin (Stroke Patients Only)       Balance Overall balance assessment: Needs assistance Sitting-balance support: Feet supported;No upper extremity supported Sitting balance-Leahy Scale: Good (NT greater than good.  ) Sitting balance - Comments: Pt c/o dizziness and nausea when sitting EOB and nausea after transfering to chair     Standing balance-Leahy Scale: Fair                               Pertinent Vitals/Pain Pain Assessment: No/denies pain    Home Living Family/patient expects to be discharged to:: Private residence Living Arrangements: Children Available Help at Discharge: Family (Son and daughter in [employer] both work full time. ) Type of Home: House       Home Layout: Two level [facility] bed (Can borrow hospitl bed) Additional Comments: pt can stay on first level with hospital bed    Prior Function Level of Independence: Independent (with  mobility prior to recent PNA)               Hand Dominance        Extremity/Trunk Assessment   Upper Extremity Assessment: Overall WFL for tasks assessed           Lower Extremity Assessment: Generalized weakness         Communication   Communication: No difficulties  Cognition Arousal/Alertness: Awake/alert Behavior During Therapy: WFL for tasks assessed/performed Overall Cognitive Status: Within Functional Limits for tasks assessed                       General Comments General comments (skin integrity, edema, etc.): Pt's son [name] present throughout eval.     Exercises General Exercises - Lower Extremity Ankle Circles/Pumps: AROM;Both;5 reps;Seated Long Arc Quad: AROM;Both;Seated (about 3 each side) Hip Flexion/Marching: AROM;Seated (about 2 reps each side)      Assessment/Plan    PT Assessment Patient needs continued PT services  PT Diagnosis Generalized weakness   PT Problem List Decreased strength;Decreased balance;Decreased mobility;Decreased knowledge of use of DME;Decreased activity tolerance  PT Treatment Interventions DME instruction;Gait training;Stair training;Therapeutic exercise;Balance training;Patient/family education   PT Goals (Current goals can be found in the Care Plan section) Acute Rehab PT Goals Patient Stated Goal: pt wants to return home PT Goal Formulation: With patient/family Time For Goal Achievement: 04/27/14 Potential to Achieve Goals: Good    Frequency Min 3X/week   Barriers to discharge Decreased caregiver support (Will be alone for a good portion of the day)      Co-evaluation               End of Session Equipment Utilized During Treatment: Gait belt Activity Tolerance: Patient limited by fatigue Patient left: with call bell/phone within reach;with family/visitor present;in bed;Other (comment) (Staff in for ultraaound study) Nurse Communication: Mobility status;Other (comment) (pt would benefit from getting OOB and ambulating with staff)         Time: 0156-1537 PT Time Calculation (min) (ACUTE ONLY): 59 min  MD and lab in to see pt during session, for approximately 15 minutes total.    Charges:   PT Evaluation $Initial PT Evaluation Tier I: 1 Procedure PT Treatments $Gait Training: 8-22 mins $Therapeutic Activity: 8-22 mins   PT G Codes:          Melvern Banker 04/20/2014, 3:26 PM  Lavonia Dana, [place] 04/20/2014

## 2014-04-21 DIAGNOSIS — J84112 Idiopathic pulmonary fibrosis: Secondary | ICD-10-CM | POA: Diagnosis present

## 2014-04-21 LAB — BASIC METABOLIC PANEL
Anion gap: 11 (ref 5–15)
BUN: 10 mg/dL (ref 6–23)
CO2: 20 meq/L (ref 19–32)
Calcium: 7.4 mg/dL — ABNORMAL LOW (ref 8.4–10.5)
Chloride: 103 mEq/L (ref 96–112)
Creatinine, Ser: 0.62 mg/dL (ref 0.50–1.10)
GFR calc Af Amer: >90 mL/min (ref >90)
GFR, EST NON AFRICAN AMERICAN: 86 mL/min — AB (ref >90)
GLUCOSE: 95 mg/dL (ref 70–99)
Potassium: 3.4 mEq/L — ABNORMAL LOW (ref 3.7–5.3)
SODIUM: 134 meq/L — AB (ref 137–147)

## 2014-04-21 LAB — GLUCOSE, CAPILLARY: GLUCOSE-CAPILLARY: 109 mg/dL — AB (ref 70–99)

## 2014-04-21 MED ORDER — AZITHROMYCIN 500 MG PO TABS
500.0000 mg | ORAL_TABLET | Freq: Every day | ORAL | Status: DC
  Filled 2014-04-21: qty 1

## 2014-04-21 MED ORDER — METHYLPREDNISOLONE SODIUM SUCC 125 MG IJ SOLR
80.0000 mg | Freq: Two times a day (BID) | INTRAMUSCULAR | Status: DC
  Administered 2014-04-21 – 2014-04-22 (×2): 80 mg via INTRAVENOUS
  Filled 2014-04-21 (×4): qty 1.28

## 2014-04-21 MED ORDER — FUROSEMIDE 20 MG PO TABS
20.0000 mg | ORAL_TABLET | Freq: Every day | ORAL | Status: DC
  Administered 2014-04-21: 20 mg via ORAL
  Filled 2014-04-21 (×3): qty 1

## 2014-04-21 NOTE — Progress Notes (Signed)
[facility] Progress Note   Carmen Duncan:[id number] DOB: 12-17-1940 DOA: 04/18/2014 PCP: [name], MD  Brief narrative: Carmen Duncan is a 73 y.o. female with a past medical history of asthma, hypertension and GERD who was recently treated for bilateral pneumonia with Levaquin. She comes in to the hospital with increasing weakness and a feeling that her pneumonia may be coming back. She admits to having a cough and some chills. It appears, according to the last office note that she has a history of hyponatremia as well. Her recent lab work shows that sodium was 127 on the 11/17 - she presented yesterday with a sodium of 125. Prior to this sodium has been in the low 130s.   Subjective: Ongoing severe generalized weakness and very little by mouth intake- she did not drink Ensure that I ordered for her yesterday and per son, has not been drinking fluids. Continues to have a congested cough. Son is at bedside and is concerned about her poor by mouth intake and her long-term plans as far as whether she can go home. He states that her bedroom is upstairs and he is concerned that she may not be able to climb the stairs at home.  Assessment/Plan: Principal Problem:   Hyponatremia-   Hypovolemia due to dehydration -Possibly secondary to poor intake in conjunction with HCTZ- may have a component of SIADH due to an underlying acute lung issues especially with serum osmolality of 269, and urine osmolality of 492. - We will hold her normal saline, start on fluid restriction, and low dose Lasix. -Continue  to hold HCTZ-sodium 134 today - We'll discontinue normal saline as patient is +5 L this admission, will keep on Trileptal fluid restriction and low dose Lasix for suspicion of SIADH.   Active Problems: Pneumonia-bilateral, community-acquired -Apparently was treated and she finished a 10 day course of Levaquin at a dose of 500 mg daily.  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 -Sputum culture ordered -Question whether she has a component of aspiration- Will order a speech eval- hold off on starting anaerobic coverage for now  Acute diastolic CHF: -Patient echo showing grade 2 diastolic dysfunction, has elevated BNP, image showing small pleural effusion. - We'll start on low dose Lasix - We will resume on ACE inhibitor once more stable.    Hypotension with a history of Hypertension -Continue propranolol but decrease dose from 40 to10 mg daily -Holding lisinopril/HCTZ  Urinary retention -Required an in and out cath 11/24 and had about 400 mL of urine in the bladder-has not urinated again will be redoing bladder scan-may need to place Foley - No further attention  Hypokalemia -Will replace- recheck in a.m.    Hyperlipidemia -Resume statin    Thrombocytopenia -Appears acute and may be a related to acute infection- - monitor closely while on heparin    Malnutrition of moderate degree -Nutritional supplements added   Code Status: Full code Family Communication:  Disposition Plan: To be determined- discussed the possibility of SNF for rehabilitation however patient became tearful and stated that she would not prefer this-PT eval ordered- is at home with her son and may be able to return home DVT prophylaxis: Heparin  Consultants: none  Procedures: none  Antibiotics: Anti-infectives    Start     Dose/Rate Route Frequency Ordered  Stop   04/20/14 1000  azithromycin (ZITHROMAX) 500 mg in dextrose 5 % 250 mL IVPB     500 mg250 mL/hr over 60 Minutes Intravenous Every 24 hours 04/20/14 0842     04/19/14 0030  cefTRIAXone (ROCEPHIN) 1 g in dextrose 5 % 50 mL IVPB - Premix     1 g100 mL/hr over 30 Minutes Intravenous Every 24 hours 04/19/14 0008         Objective: Filed Weights   04/19/14 0506 04/20/14 0458 04/21/14 0540  Weight: 64.91 kg (143 lb 1.6 oz) 64.774 kg (142 lb 12.8 oz) 67.042 kg (147 lb 12.8 oz)    Intake/Output Summary (Last 24 hours) at 04/21/14 0952 Last data filed at 04/21/14 0442  Gross per 24 hour  Intake 2827.67 ml  Output    900 ml  Net 1927.67 ml     Vitals Filed Vitals:   04/20/14 0458 04/20/14 1046 04/20/14 2114 04/21/14 0540  BP: 111/51 104/56 98/84 136/47  Pulse: 86 83 94 92  Temp: 99.2 F (37.3 C)  98.7 F (37.1 C) 97.7 F (36.5 C)  TempSrc: Oral  Oral Oral  Resp: 18  18 18   Height:      Weight: 64.774 kg (142 lb 12.8 oz)   67.042 kg (147 lb 12.8 oz)  SpO2: 92% 93% 92% 92%    Exam: General: Awake alert oriented 3 No acute respiratory distress, he was extremely weak and is very forgetful in regards to yesterday's events Lungs:  crackles at bilateral bases Cardiovascular: Regular rate and rhythm without murmur gallop or rub normal S1 and S2 Abdomen: Nontender, nondistended, soft, bowel sounds positive, no rebound, no ascites, no appreciable mass Extremities: No significant cyanosis, clubbing, or edema bilateral lower extremities  Data Reviewed: Basic Metabolic Panel:  Recent Labs Lab 04/18/14 1933 04/19/14 0410 04/20/14 0441 04/21/14 0405  NA 125* 130* 129* 134*  K 3.3* 3.3* 3.7 3.4*  CL 88* 93* 97 103  CO2 22 23 22 20   GLUCOSE 119* 109* 94 95  BUN 27* 24* 15 10  CREATININE 0.95 0.94 0.80 0.62  CALCIUM 8.2* 7.5* 7.6* 7.4*  MG  --  1.9  --   --    Liver Function Tests:  Recent Labs Lab 04/18/14 1933 04/19/14 0410  AST 63* 52*  ALT 50* 42*  ALKPHOS 40 35*  BILITOT 0.5 0.4  PROT 5.5* 4.8*  ALBUMIN 2.1* 1.8*   No results for input(s): LIPASE, AMYLASE in the last 168 hours. No results for input(s): AMMONIA in the last 168 hours. CBC:  Recent Labs Lab 04/18/14 1933 04/19/14 0410 04/20/14 0441  WBC 11.9* 8.9 7.8  HGB 14.6 12.7 12.5  HCT 41.6 37.0 36.9  MCV  86.3 87.9 88.7  PLT 168 145* 129*   Cardiac Enzymes: No results for input(s): CKTOTAL, CKMB, CKMBINDEX, TROPONINI in the last 168 hours. BNP (last 3 results)  Recent Labs  04/20/14 1444  PROBNP 2447.0*   CBG:  Recent Labs Lab 04/19/14 0634 04/20/14 0750 04/21/14 0639  GLUCAP 100* 96 109*    Recent Results (from the past 240 hour(s))  Urine culture     Status: None   Collection Time: 04/18/14 10:20 PM  Result Value Ref Range Status   Specimen Description URINE, RANDOM  Final   Special Requests NONE  Final   Culture  Setup Time   Final    04/18/2014 23:08 Performed at [place] Performed at [facility]  Final   Culture NO GROWTH Performed at [facility]   Final   Report Status 04/20/2014 FINAL  Final     Studies:  Recent x-ray studies have been reviewed in detail by the Attending Physician  Scheduled Meds:  Scheduled Meds: . acidophilus  1 capsule Oral QHS  . antiseptic oral rinse  7 mL Mouth Rinse BID  . aspirin EC  81 mg Oral Daily  . atorvastatin  20 mg Oral Daily  . azithromycin  500 mg Intravenous Q24H  . cefTRIAXone (ROCEPHIN)  IV  1 g Intravenous Q24H  . dronabinol  2.5 mg Oral BID AC  . feeding supplement (ENSURE COMPLETE)  237 mL Oral TID BM  . folic acid  1 mg Oral Daily  . furosemide  20 mg Oral Daily  . heparin  5,000 Units Subcutaneous 3 times per day  . multivitamin with minerals  1 tablet Oral Daily  . nystatin  5 mL Oral QID  . pantoprazole  40 mg Oral Daily  . propranolol  10 mg Oral QHS  . sodium chloride  3 mL Intravenous Q12H  . thiamine  100 mg Oral Daily  . topiramate  100 mg Oral QHS   Continuous Infusions: . sodium chloride 125 mL/hr at 04/20/14 2357    Time spent on care of this patient: 30 min   Cassadie Pankonin, MD 04/21/2014, 9:52 AM  LOS: 3 days   [facility] Office  [phone] Pager - Text Page per [email or web address]  If 7PM-7AM, please contact  night-coverage [email or web address]

## 2014-04-21 NOTE — Consult Note (Signed)
Name: Carmen Duncan MRN: [id number] DOB: [birth year]    ADMISSION DATE:  04/18/2014 CONSULTATION DATE:  11/25  REFERRING MD :  Wynelle Cleveland  PCP - Burchette   CHIEF COMPLAINT:  PNA   BRIEF PATIENT DESCRIPTION: 73yo female with hx asthma, HTN recent outpt rx for CAP (which was dx at return OV for continued SOB/DOE, already being w/u by PCP and cards).  Returned 11/23 with c/o continued SOB, cough, weakness, poor PO intake.  Admitted by [facility] with residual PNA, hyponatremia, dehydration.  PCCM asked to evaluate for non-resolving infiltrates.   SIGNIFICANT EVENTS  11/11 outpt OV, bibasilar CAP (v edema), rx levaquin x 10 days   STUDIES:  11/5>>outpt nuc med stress test>>LOW risk CT chest 11/25>>> 2D echo 11/25>>>   SUBJECTIVE:  No real changes  VITAL SIGNS: Temp:  [97.7 F (36.5 C)-98.7 F (37.1 C)] 98.3 F (36.8 C) (11/26 1046) Pulse Rate:  [92-94] 94 (11/26 1046) Resp:  [18] 18 (11/26 1046) BP: (98-136)/(47-84) 130/58 mmHg (11/26 1046) SpO2:  [92 %-97 %] 97 % (11/26 1046) Weight:  [67.042 kg (147 lb 12.8 oz)] 67.042 kg (147 lb 12.8 oz) (11/26 0540)  PHYSICAL EXAMINATION: General:  Pleasant female, NAD  Neuro:  Awake, alert, appropriate, MAE  HEENT:  Mm moist, no JVD  Cardiovascular:  s1s2 rrr  Lungs:  resps even, non labored on RA, few bibasilar rales R>L, no audible wheeze  Abdomen:  Soft, +bs Musculoskeletal:  Warm and dry, no sig edema    Recent Labs Lab 04/19/14 0410 04/20/14 0441 04/21/14 0405  NA 130* 129* 134*  K 3.3* 3.7 3.4*  CL 93* 97 103  CO2 23 22 20   BUN 24* 15 10  CREATININE 0.94 0.80 0.62  GLUCOSE 109* 94 95    Recent Labs Lab 04/18/14 1933 04/19/14 0410 04/20/14 0441  HGB 14.6 12.7 12.5  HCT 41.6 37.0 36.9  WBC 11.9* 8.9 7.8  PLT 168 145* 129*   Ct Chest High Resolution  04/20/2014   CLINICAL DATA:  73 year old female with history a of pneumonia 1 month ago complaining of persistent upper chest pressure and dry nonproductive cough.   EXAM: CT CHEST WITHOUT CONTRAST  TECHNIQUE: Multidetector CT imaging of the chest was performed following the standard protocol without intravenous contrast. High resolution imaging of the lungs, as well as inspiratory and expiratory imaging, was performed.  COMPARISON:  No priors.  FINDINGS: Mediastinum: Heart size is borderline enlarged. There is no significant pericardial fluid, thickening or pericardial calcification. Multiple borderline enlarged and minimally enlarged mediastinal and bilateral hilar lymph nodes, largest of which measures up to 1 cm in the lower right paratracheal station. Esophagus is unremarkable in appearance. Atherosclerosis in the thoracic aorta and great vessels of the mediastinum.  Lungs/Pleura: There patchy predominantly peripheral opacities which are a combination of consolidative opacities and ground-glass attenuation with some inter- and interlobular septal thickening. These are randomly distributed, with a very slight basal predominance. Some thickening of the peribronchovascular interstitium is also noted. Small bilateral pleural effusions. Inspiratory and expiratory imaging is unremarkable.  Upper Abdomen: There are two low to intermediate attenuation liver lesions, which are incompletely characterized on today's noncontrast CT, largest of which measures 2.6 cm in segment 8 of the liver; these are incompletely characterized.  Musculoskeletal: There are no aggressive appearing lytic or blastic lesions noted in the visualized portions of the skeleton.  IMPRESSION: 1. Patchy multifocal opacities throughout the periphery of the lungs bilaterally, with additional findings, as detailed above. These findings  are favored to reflect a post infectious process such as cryptogenic organizing pneumonia (COP). Strictly speaking, residual multifocal infection is difficult to exclude, but is not strongly favored. 2. Small bilateral pleural effusions layering dependently. 3. Two liver lesions, as  above, incompletely characterized, but similar to remote prior study 04/05/2008, presumably cysts. 4. Atherosclerosis.   Electronically Signed   By: Vinnie Langton M.D.   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

## 2014-04-21 NOTE — Progress Notes (Signed)
SLP Cancellation Note  Patient Details Name: AMAYA BLAKEMAN MRN: [id number] DOB: 11/27/1940   Cancelled treatment:       Reason Eval/Treat Not Completed: Patient at procedure or test/unavailable. Will evaluate when able.   Kern Reap, MA, CCC-SLP 04/21/2014, 11:10 AM

## 2014-04-22 ENCOUNTER — Ambulatory Visit: Payer: Medicare Other | Admitting: Cardiology

## 2014-04-22 DIAGNOSIS — J8489 Other specified interstitial pulmonary diseases: Secondary | ICD-10-CM

## 2014-04-22 LAB — BASIC METABOLIC PANEL
Anion gap: 11 (ref 5–15)
BUN: 11 mg/dL (ref 6–23)
CHLORIDE: 100 meq/L (ref 96–112)
CO2: 20 mEq/L (ref 19–32)
Calcium: 8.1 mg/dL — ABNORMAL LOW (ref 8.4–10.5)
Creatinine, Ser: 0.54 mg/dL (ref 0.50–1.10)
Glucose, Bld: 141 mg/dL — ABNORMAL HIGH (ref 70–99)
Potassium: 4.4 mEq/L (ref 3.7–5.3)
SODIUM: 131 meq/L — AB (ref 137–147)

## 2014-04-22 LAB — CBC
HCT: 38.7 % (ref 36.0–46.0)
Hemoglobin: 13.3 g/dL (ref 12.0–15.0)
MCH: 30.4 pg (ref 26.0–34.0)
MCHC: 34.4 g/dL (ref 30.0–36.0)
MCV: 88.6 fL (ref 78.0–100.0)
Platelets: 162 10*3/uL (ref 150–400)
RBC: 4.37 MIL/uL (ref 3.87–5.11)
RDW: 13.6 % (ref 11.5–15.5)
WBC: 9.3 10*3/uL (ref 4.0–10.5)

## 2014-04-22 LAB — GLUCOSE, CAPILLARY: GLUCOSE-CAPILLARY: 124 mg/dL — AB (ref 70–99)

## 2014-04-22 MED ORDER — LISINOPRIL 20 MG PO TABS: 20.0000 mg | ORAL_TABLET | Freq: Every day | ORAL | Status: DC

## 2014-04-22 MED ORDER — PREDNISONE 20 MG PO TABS
40.0000 mg | ORAL_TABLET | Freq: Every day | ORAL | Status: DC
  Administered 2014-04-22 – 2014-04-23 (×2): 40 mg via ORAL
  Filled 2014-04-22 (×3): qty 2

## 2014-04-22 MED ORDER — POLYETHYLENE GLYCOL 3350 17 G PO PACK: 17.0000 g | PACK | Freq: Every day | ORAL | Status: DC | PRN

## 2014-04-22 MED ORDER — PREDNISONE (PAK) 10 MG PO TABS: ORAL_TABLET | Freq: Every day | ORAL | Status: DC

## 2014-04-22 MED ORDER — FUROSEMIDE 20 MG PO TABS: 20.0000 mg | ORAL_TABLET | Freq: Every day | ORAL | Status: DC

## 2014-04-22 MED ORDER — FOLIC ACID 1 MG PO TABS: 1.0000 mg | ORAL_TABLET | Freq: Every day | ORAL | Status: DC

## 2014-04-22 MED ORDER — ENSURE COMPLETE PO LIQD: 237.0000 mL | Freq: Three times a day (TID) | ORAL | Status: DC

## 2014-04-22 MED ORDER — PROPRANOLOL HCL 10 MG PO TABS: 10.0000 mg | ORAL_TABLET | Freq: Every day | ORAL | Status: DC

## 2014-04-22 NOTE — Progress Notes (Signed)
Physical Therapy Treatment Patient Details Name: Carmen Duncan MRN: [id number] DOB: Oct 25, 1940 Today's Date: 04/22/2014    History of Present Illness 73yo female with hx asthma, HTN recent outpt rx for pneumonia.Admitted 11/23 with c/o continued SOB, cough, weakness, poor PO intake. Admitted with residual PNA, hyponatremia, dehydration.  Further work-up in progress    PT Comments    Pt progressing but more slowly than anticipated for going home. Pt able to ambulate to hallway only, definitely unable to tolerate stairs, O2 desat to 86% on RA, HR 110 bpm. Had lengthy discussion about benefits of SNF for further rehab. Pt to discuss with her son. PT will continue to follow.   Follow Up Recommendations  SNF;Supervision/Assistance - 24 hour     Equipment Recommendations  Rolling walker with 5" wheels    Recommendations for Other Services       Precautions / Restrictions Precautions Precautions: Fall Restrictions Weight Bearing Restrictions: No    Mobility  Bed Mobility Overal bed mobility: Needs Assistance Bed Mobility: Supine to Sit     Supine to sit: Supervision     General bed mobility comments: pt instructed to try to get up without using bed rail but was unable to do so. Had great difficulty removing covers from legs to get up, then required rail to get to full sitting, increased time needed to scoot to EOB, pt with dizziness and fatigue. Performed AP's sitting EOB until dizziness passed.   Transfers Overall transfer level: Needs assistance Equipment used: Rolling walker (2 wheeled) Transfers: Sit to/from Stand Sit to Stand: Min assist         General transfer comment: pt did not want to use RW however, once stood up, had difficulty maintaining static standing due to fatigue so required RW for further mobility. Min A to steady  Ambulation/Gait Ambulation/Gait assistance: Min assist Ambulation Distance (Feet): 40 Feet Assistive device: Rolling walker (2  wheeled) Gait Pattern/deviations: Step-through pattern;Decreased stride length Gait velocity: decreased   General Gait Details: plan was to ambulate to stairwell and practice steps because pt possibly to d/c today. However, pt reached hallway and was exhausted and feeling faint, HR 110bpm, O2 sats down to 86%, required return to chair for seated rest. O2 sats returned to 90's in 4 mins. Pt discouraged by weakness.    Stairs            Wheelchair Mobility    Modified Rankin (Stroke Patients Only)       Balance Overall balance assessment: Needs assistance Sitting-balance support: Feet supported;No upper extremity supported Sitting balance-Leahy Scale: Good     Standing balance support: Bilateral upper extremity supported;During functional activity Standing balance-Leahy Scale: Poor Standing balance comment: required UE support for safe standing today                    Cognition Arousal/Alertness: Awake/alert Behavior During Therapy: WFL for tasks assessed/performed Overall Cognitive Status: Within Functional Limits for tasks assessed                      Exercises General Exercises - Lower Extremity Ankle Circles/Pumps: AROM;10 reps;Both;Seated    General Comments General comments (skin integrity, edema, etc.): discussed possibilty of SNF rehab with pt due to the fact that she would be home alone at times. Pt does not want to go to SNF but does agree that she is very weak. Left she and son to talk it over and discussed with case mgmt  Pertinent Vitals/Pain Pain Assessment: No/denies pain    Home Living                      Prior Function            PT Goals (current goals can now be found in the care plan section) Acute Rehab PT Goals Patient Stated Goal: pt wants to return home PT Goal Formulation: With patient/family Time For Goal Achievement: 04/27/14 Potential to Achieve Goals: Good Progress towards PT goals: Progressing  toward goals    Frequency  Min 3X/week    PT Plan Discharge plan needs to be updated    Co-evaluation             End of Session Equipment Utilized During Treatment: Gait belt Activity Tolerance: Patient limited by fatigue Patient left: in chair;with call bell/phone within reach;with family/visitor present     Time: 1000-1025 PT Time Calculation (min) (ACUTE ONLY): 25 min  Charges:  $Gait Training: 8-22 mins $Therapeutic Activity: 8-22 mins                    G Codes:     Leighton Roach, PT  Acute Rehab Services  [phone]  Leighton Roach 04/22/2014, 12:01 PM

## 2014-04-22 NOTE — Progress Notes (Signed)
Chaplain returned for follow-up visit. Pt's friend Hal at bedside. Chaplain chatted briefly with pt, who says she is eager to "move on" to rehab and get out of the hospital. Chaplain provided prayer, "for health and happiness" as pt requested. Will follow as needed.  Vanetta Mulders 04/22/2014 2:11 PM

## 2014-04-22 NOTE — Discharge Summary (Addendum)
Carmen Duncan, 73 y.o., DOB 02-Aug-1940, MRN [id number]. Admission date: 04/18/2014 Discharge Date 04/22/2014 Primary MD [name], MD Admitting Physician Allyne Gee, MD   Addendum on discharge summary from 04/22/14 Discharge planning has been changed, patient is being discharged home with home care, case manager consulted for home care arrangements.  Admission Diagnosis  Shortness of breath [R06.02] Dehydration [E86.0] Hyponatremia [E87.1] Weakness [R53.1]   Discharge Diagnosis   Principal Problem:   BOOP (bronchiolitis obliterans with organizing pneumonia) Active Problems:   Hypertension   Hyperlipidemia   Hypovolemia due to dehydration   Hyponatremia   Thrombocytopenia   Malnutrition of moderate degree      Past Medical History  Diagnosis Date  . Arthritis   . Asthma   . Hypertension   . GERD (gastroesophageal reflux disease)   . Hyperlipidemia   . Diverticulosis   . IBS (irritable bowel syndrome)   . HOH (hard of hearing) left ear  . Acoustic neuroma     Past Surgical History  Procedure Laterality Date  . Cholecystectomy  2011  . Abdominal hysterectomy  1980  . Tonsillectomy  1945  . Biopsy breast    . Eyes  2007    cararacts   Admission history of present illness/brief narrative: Carmen Duncan is a 73 y.o. female with a past medical history of asthma, hypertension and GERD who was recently treated for bilateral pneumonia with Levaquin. She comes in to the hospital with increasing weakness and a feeling that her pneumonia may be coming back. She admits to having a cough and some chills. It appears, according to the last office note that she has a history of hyponatremia as well. Her recent lab work shows that sodium was 127 on the 11/17 - she presented yesterday with a sodium of 125. Prior to this sodium has been in the low 130s. Patient had pulmonary consult regarding  pneumonia, with high-resolution CT chest showing evidence of cryptogenic organizing  pneumonia, antibiotics were stopped, and patient started on steroid with recommendation also long tapering dose with outpatient pulmonary follow-up. Regarding patient hyponatremia, was felt initially to be secondary to volume depletion, and hydrochlorothiazide, corrected after appropriate fluid hydration, but then trended down, with suspicion of SIADH.  Hospital Course See H&P, Labs, Consult and Test reports for all details in brief, patient was admitted for **  Principal Problem:   BOOP (bronchiolitis obliterans with organizing pneumonia) Active Problems:   Hypertension   Hyperlipidemia   Hypovolemia due to dehydration   Hyponatremia   Thrombocytopenia   Malnutrition of moderate degree  Hyponatremia-  -Possibly secondary to poor intake in conjunction with HCTZ- may have a component of SIADH due to an underlying acute lung issues especially with serum osmolality of 269, and urine osmolality of 492. -Patient is positive 5 L during this admission, appears to be in mild diastolic CHF as evident by echo, and CT chest. -Will be kept on fluid restriction 1500 mL on discharge, and will be started on low dose Lasix 20 mg oral daily, secondary to suspicion of SIADH, with recommendation to monitor BMP daily for the next 3 days. - We'll continue to hold patient hydrochlorothiazide on discharge.     Pneumonia-bilateral, community-acquired/ cryptogenic organizing pneumonia -was treated and she finished a 10 day course of Levaquin at a dose of 500 mg daily.  - CT chest suspicious for cryptogenic organizing pneumonia, pulmonary consulted. -Patient was started on IV Solu-Medrol and patient, will be discharged on slow taper of prednisone, and  to follow with pulmonary as an outpatient, appointment has been already set by pulmonary service.  Acute diastolic CHF: -Patient echo showing grade 2 diastolic dysfunction, has elevated BNP, image showing small pleural effusion. - We'll discharge on low dose  Lasix - We will resume on ACE inhibitor on discharge   Hypotension with a history of Hypertension -Blood pressure has been stable, no further episodes of hypotension. -Continue propranolol but decrease dose from 40 to10 mg daily on discharge, resume lisinopril on discharge. -Holding HCTZ   Hypokalemia -Replaced   Hyperlipidemia -Resume statin   Thrombocytopenia -Platelet is 162,000 at day of discharge   Malnutrition of moderate degree -Nutritional supplements added      Consults   Pulmonary  Significant Tests:  See full reports for all details    Dg Chest 2 View  04/18/2014   CLINICAL DATA:  Shortness of breath and chest pain  EXAM: CHEST  2 VIEW  COMPARISON:  04/06/2014  FINDINGS: Cardiac shadow is stable. There is been improved aeration in the bases bilaterally when compare with the prior exam. Some residual effusion is noted particularly on the right. No acute bony abnormality is seen.  IMPRESSION: Improved aeration in the bases bilaterally although the persistent changes of effusion and infiltrate are seen.   Electronically Signed   By: Inez Catalina M.D.   On: 04/18/2014 21:14   Dg Chest 2 View  04/06/2014   CLINICAL DATA:  Cough.  Shortness of breath.  EXAM: CHEST  2 VIEW  COMPARISON:  11/03/2009.  FINDINGS: Mediastinum and hilar structures normal. Borderline cardiomegaly with normal pulmonary vascularity. Bilateral prominent basilar pulmonary infiltrates. Small pleural effusions. Although these changes could be related to congestive heart failure bibasilar pneumonia should be considered. No pneumothorax. No acute bony abnormality .  IMPRESSION: 1. Borderline cardiomegaly, normal pulmonary vascularity. 2. Bibasilar pulmonary infiltrates with small bilateral pleural effusions. Although these changes could be related congestive heart failure bibasilar pneumonia should be considered.   Electronically Signed   By: Marcello Moores  Register   On: 04/06/2014 16:18   Ct Chest High  Resolution  04/20/2014   CLINICAL DATA:  73 year old female with history a of pneumonia 1 month ago complaining of persistent upper chest pressure and dry nonproductive cough.  EXAM: CT CHEST WITHOUT CONTRAST  TECHNIQUE: Multidetector CT imaging of the chest was performed following the standard protocol without intravenous contrast. High resolution imaging of the lungs, as well as inspiratory and expiratory imaging, was performed.  COMPARISON:  No priors.  FINDINGS: Mediastinum: Heart size is borderline enlarged. There is no significant pericardial fluid, thickening or pericardial calcification. Multiple borderline enlarged and minimally enlarged mediastinal and bilateral hilar lymph nodes, largest of which measures up to 1 cm in the lower right paratracheal station. Esophagus is unremarkable in appearance. Atherosclerosis in the thoracic aorta and great vessels of the mediastinum.  Lungs/Pleura: There patchy predominantly peripheral opacities which are a combination of consolidative opacities and ground-glass attenuation with some inter- and interlobular septal thickening. These are randomly distributed, with a very slight basal predominance. Some thickening of the peribronchovascular interstitium is also noted. Small bilateral pleural effusions. Inspiratory and expiratory imaging is unremarkable.  Upper Abdomen: There are two low to intermediate attenuation liver lesions, which are incompletely characterized on today's noncontrast CT, largest of which measures 2.6 cm in segment 8 of the liver; these are incompletely characterized.  Musculoskeletal: There are no aggressive appearing lytic or blastic lesions noted in the visualized portions of the skeleton.  IMPRESSION: 1. Patchy  multifocal opacities throughout the periphery of the lungs bilaterally, with additional findings, as detailed above. These findings are favored to reflect a post infectious process such as cryptogenic organizing pneumonia (COP). Strictly  speaking, residual multifocal infection is difficult to exclude, but is not strongly favored. 2. Small bilateral pleural effusions layering dependently. 3. Two liver lesions, as above, incompletely characterized, but similar to remote prior study 04/05/2008, presumably cysts. 4. Atherosclerosis.   Electronically Signed   By: Vinnie Langton M.D.   On: 04/20/2014 16:02     Today   Subjective:   Lilian Fuhs today has no headache,no chest abdominal pain,no new weakness tingling or numbness, feels much better.  Objective:   Blood pressure 138/72, pulse 89, temperature 97.4 F (36.3 C), temperature source Oral, resp. rate 18, height 5\' 1"  (1.549 m), weight 68.402 kg (150 lb 12.8 oz), SpO2 100 %.  Intake/Output Summary (Last 24 hours) at 04/22/14 1423 Last data filed at 04/22/14 1411  Gross per 24 hour  Intake    870 ml  Output   1020 ml  Net   -150 ml    Exam Awake Alert, Oriented *3, No new F.N deficits, Normal affect [place].AT,PERRAL Supple Neck,No JVD, No cervical lymphadenopathy appriciated.  Symmetrical Chest wall movement, Good air movement bilaterally,  RRR,No Gallops,Rubs or new Murmurs, No Parasternal Heave +ve B.Sounds, Abd Soft, Non tender, No organomegaly appriciated, No rebound -guarding or rigidity. No Cyanosis, Clubbing or edema, No new Rash or bruise  Data Review     CBC w Diff:  Lab Results  Component Value Date   WBC 9.3 04/22/2014   HGB 13.3 04/22/2014   HCT 38.7 04/22/2014   PLT 162 04/22/2014   LYMPHOPCT 5.0 Repeated and verified X2.* 04/12/2014   MONOPCT 4.4 04/12/2014   EOSPCT 0.6 04/12/2014   BASOPCT 0.2 04/12/2014   CMP:  Lab Results  Component Value Date   NA 131* 04/22/2014   K 4.4 04/22/2014   CL 100 04/22/2014   CO2 20 04/22/2014   BUN 11 04/22/2014   CREATININE 0.54 04/22/2014   PROT 4.8* 04/19/2014   ALBUMIN 1.8* 04/19/2014   BILITOT 0.4 04/19/2014   ALKPHOS 35* 04/19/2014   AST 52* 04/19/2014   ALT 42* 04/19/2014  .  Micro  Results Recent Results (from the past 240 hour(s))  Urine culture     Status: None   Collection Time: 04/18/14 10:20 PM  Result Value Ref Range Status   Specimen Description URINE, RANDOM  Final   Special Requests NONE  Final   Culture  Setup Time   Final    04/18/2014 23:08 Performed at [place] Performed at [facility]   Final   Culture NO GROWTH Performed at [facility]   Final   Report Status 04/20/2014 FINAL  Final     Discharge Instructions   please follow with pulmonary as an outpatient.Tammy ParrettNP At 04/28/14 1030AM . Check BMP with PCP after discharge.     Follow-up Information    Follow up with [place] In 1 week.   Contact information:   [place] [id number]       Discharge Medications     Medication List    STOP taking these medications        ALIGN 4 MG Caps     levofloxacin 500 MG tablet  Commonly known as:  LEVAQUIN     lisinopril-hydrochlorothiazide 20-12.5 MG  per tablet  Commonly known as:  PRINZIDE,ZESTORETIC      TAKE these medications        acetaminophen 325 MG tablet  Commonly known as:  TYLENOL  Take 650 mg by mouth every 6 (six) hours as needed for headache.     aspirin 81 MG tablet  Take 81 mg by mouth daily.     atorvastatin 20 MG tablet  Commonly known as:  LIPITOR  Take 1 tablet (20 mg total) by mouth daily.     cetirizine 10 MG tablet  Commonly known as:  ZYRTEC  Take 10 mg by mouth daily.     clidinium-chlordiazePOXIDE 5-2.5 MG per capsule  Commonly known as:  LIBRAX  Take 1 capsule by mouth 2 (two) times daily as needed. Pt only taking prn due to cost     feeding supplement (ENSURE COMPLETE) Liqd  Take 237 mLs by mouth 3 (three) times daily between meals.     folic acid 1 MG tablet  Commonly known as:  FOLVITE  Take 1 tablet (1 mg total) by mouth daily.     furosemide 20 MG tablet  Commonly known as:   LASIX  Take 1 tablet (20 mg total) by mouth daily.     lisinopril 20 MG tablet  Commonly known as:  PRINIVIL,ZESTRIL  Take 1 tablet (20 mg total) by mouth daily.     nystatin 100000 UNIT/ML suspension  Commonly known as:  MYCOSTATIN  Take 5 mLs (500,000 Units total) by mouth 4 (four) times daily.     omeprazole 20 MG tablet  Commonly known as:  PRILOSEC OTC  Take 20 mg by mouth daily.     OPTIMUM WETTING/REWETTING DROP Soln  Place 1 drop into both eyes as needed (for dry eyes).     polyethylene glycol packet  Commonly known as:  MIRALAX / GLYCOLAX  Take 17 g by mouth daily as needed for mild constipation.     predniSONE 10 MG tablet  Commonly known as:  STERAPRED UNI-PAK  - Take by mouth daily. Please use 40 mg oral daily (4 tablets ) from 11/27 till 12/6  - Then decrease to 30 mg oral daily(3 tablets) from 12/7 till 12/16  - Then decrease to 20 mg oral daily( 2 tablets) from 12/17 , continue with this dose until further instruction by pulmonary regarding further dosing.     propranolol 10 MG tablet  Commonly known as:  INDERAL  Take 1 tablet (10 mg total) by mouth at bedtime.     topiramate 50 MG tablet  Commonly known as:  TOPAMAX  Take 2 tablets (100 mg total) by mouth at bedtime.     UNABLE TO FIND  Place 1 application into the nose as needed (for sinuses). Milta Deiters med sinus rinse         Total Time in preparing paper work, data evaluation and todays exam - 35 minutes  Renardo Cheatum M.D on 04/22/2014 at 2:23 PM  [facility] Office  [phone]

## 2014-04-22 NOTE — Evaluation (Signed)
Clinical/Bedside Swallow Evaluation Patient Details  Name: Carmen Duncan MRN: [id number] Date of Birth: 02-Nov-1940  Today's Date: 04/22/2014 Time: 1210-1223 SLP Time Calculation (min) (ACUTE ONLY): 13 min  Past Medical History:  Past Medical History  Diagnosis Date  . Arthritis   . Asthma   . Hypertension   . GERD (gastroesophageal reflux disease)   . Hyperlipidemia   . Diverticulosis   . IBS (irritable bowel syndrome)   . HOH (hard of hearing) left ear  . Acoustic neuroma    Past Surgical History:  Past Surgical History  Procedure Laterality Date  . Cholecystectomy  2011  . Abdominal hysterectomy  1980  . Tonsillectomy  1945  . Biopsy breast    . Eyes  2007    cararacts   HPI:  73yo female with hx asthma, HTN recent outpt rx for CAP (which was dx at return OV for continued SOB/DOE, already being w/u by PCP and cards).  Returned 11/23 with c/o continued SOB, cough, weakness, poor PO intake.  Admitted by [facility] with residual PNA, hyponatremia, dehydration.    Assessment / Plan / Recommendation Clinical Impression  Pt demonstrates no evidence of aspiration. She does report mild globus with solids, resolved with verbal cues for sips of liquids following solids. SLP provided brief education. No f/u needed, pt may continue current diet.     Aspiration Risk  Mild    Diet Recommendation Regular;Thin liquid   Liquid Administration via: Cup;Straw Medication Administration: Whole meds with liquid Supervision: Patient able to self feed Compensations: Follow solids with liquid Postural Changes and/or Swallow Maneuvers: Seated upright 90 degrees    Other  Recommendations Oral Care Recommendations: Patient independent with oral care   Follow Up Recommendations  None    Frequency and Duration        Pertinent Vitals/Pain NA    SLP Swallow Goals     Swallow Study Prior Functional Status       General HPI: 73yo female with hx asthma, HTN recent outpt rx for CAP (which  was dx at return OV for continued SOB/DOE, already being w/u by PCP and cards).  Returned 11/23 with c/o continued SOB, cough, weakness, poor PO intake.  Admitted by [facility] with residual PNA, hyponatremia, dehydration.  Type of Study: Bedside swallow evaluation Diet Prior to this Study: Regular;Thin liquids Temperature Spikes Noted: No Respiratory Status: Room air History of Recent Intubation: No Behavior/Cognition: Alert;Cooperative;Pleasant mood Oral Cavity - Dentition: Adequate natural dentition Self-Feeding Abilities: Able to feed self Patient Positioning: Upright in chair Baseline Vocal Quality: Clear Volitional Cough: Strong Volitional Swallow: Able to elicit    Oral/Motor/Sensory Function Overall Oral Motor/Sensory Function: Appears within functional limits for tasks assessed   Ice Chips     Thin Liquid Thin Liquid: Within functional limits    Nectar Thick Nectar Thick Liquid: Not tested   Honey Thick Honey Thick Liquid: Not tested   Puree Puree: Within functional limits   Solid   GO    Solid: Within functional limits      [facility], MA CCC-SLP [phone]  Miaa Latterell, Katherene Ponto 04/22/2014,1:16 PM

## 2014-04-22 NOTE — Progress Notes (Signed)
Alert and oriented x 3. Skin warm and dry. Oral mucosa pink and moist. Respirations deep and even. No SOB. No edema noted. Denies pain. Patient does still complain of weakness. Son and grandchildren has been here visiting patient.

## 2014-04-22 NOTE — Clinical Social Work Psychosocial (Signed)
Clinical Social Work Department BRIEF PSYCHOSOCIAL ASSESSMENT 04/22/2014  Patient:  Carmen Duncan, Carmen Duncan     Account Number:  [id number]     Admit date:  04/18/2014  Clinical Social Worker:  Dian Queen  Date/Time:  04/22/2014 04:55 PM  Referred by:  Physician  Date Referred:  04/22/2014 Referred for  SNF Placement   Other Referral:   Interview type:  Patient Other interview type:   Son    PSYCHOSOCIAL DATA Living Status:  ALONE Admitted from facility:   Level of care:   Primary support name:  [facility] Primary support relationship to patient:  CHILD, ADULT Degree of support available:   Patient's son and his wife work full time, patient does not have much support at home.    CURRENT CONCERNS Current Concerns  Post-Acute Placement   Other Concerns:    SOCIAL WORK ASSESSMENT / PLAN Patient is a 73 year old female who was living on her own. Patient is alert and oriented x3.  Patien asks the same questions a couple of times but is able to express what she wants and where she wants to go.  Patient would like to go home but understands that she needs to get some rehab to get her strength back up in order to return home.  Patient would like to go to rehab in [place], but is open to [facility] if she needs to.  Patient and son are in agreement to attend to SNF for short term rehab once discharge orders have been received and bed is avaiable.   Assessment/plan status:   Other assessment/ plan:   Information/referral to community resources:    PATIENT'S/FAMILY'S RESPONSE TO PLAN OF CARE: Patient and family agreeable to going to SNF for rehab.    [name]. [place], MSW, [place] 04/22/2014 5:01 PM

## 2014-04-22 NOTE — Progress Notes (Signed)
   04/22/14 1306  Clinical Encounter Type  Visited With Patient and family together  Visit Type Initial;Spiritual support  Referral From Nurse  Consult/Referral To Chaplain  Chaplain visited per referral of nurse. PT's son and friend at bedside. Pt was eating lunch, and requested that chaplain return a little later, saying "I think I'll still be here. They're going to transfer me some time." Chaplain will try to return in about an hour.  Vanetta Mulders 04/22/2014 1:07 PM

## 2014-04-22 NOTE — Plan of Care (Signed)
Problem: Phase I Progression Outcomes Goal: Voiding-avoid urinary catheter unless indicated Outcome: Completed/Met Date Met:  04/22/14     

## 2014-04-22 NOTE — Progress Notes (Addendum)
Name: Carmen Duncan MRN: [id number] DOB: [birth year]    ADMISSION DATE:  04/18/2014 CONSULTATION DATE:  11/25  REFERRING MD :  Wynelle Cleveland  PCP - [place]   CHIEF COMPLAINT:  PNA   BRIEF PATIENT DESCRIPTION: 73yo female with hx asthma, HTN recent outpt rx for CAP (which was dx at return OV for continued SOB/DOE, already being w/u by PCP and cards).  Returned 11/23 with c/o continued SOB, cough, weakness, poor PO intake.  Admitted by [facility] with residual PNA, hyponatremia, dehydration.  PCCM asked to evaluate for non-resolving infiltrates.   SIGNIFICANT EVENTS  11/11 outpt OV, bibasilar CAP (v edema), rx levaquin x 10 days   STUDIES:  11/5>>outpt nuc med stress test>>LOW risk Echo 11/25>> Ef 20-25% diastolic noncompliance   SUBJECTIVE:  No real changes  VITAL SIGNS: Temp:  [97.6 F (36.4 C)-98.3 F (36.8 C)] 97.7 F (36.5 C) (11/27 0414) Pulse Rate:  [94-102] 102 (11/27 0414) Resp:  [18] 18 (11/27 0414) BP: (130-148)/(58-77) 148/77 mmHg (11/27 0414) SpO2:  [92 %-100 %] 100 % (11/27 0414) Weight:  [68.402 kg (150 lb 12.8 oz)] 68.402 kg (150 lb 12.8 oz) (11/27 0414)  PHYSICAL EXAMINATION: General:  Pleasant female, NAD  Neuro:  Awake, alert, appropriate, MAE  HEENT:  Mm moist, no JVD  Cardiovascular:  s1s2 rrr  Lungs:  resps even, non labored on RA, few bibasilar rales R>L, no audible wheeze  Abdomen:  Soft, +bs Musculoskeletal:  Warm and dry, no sig edema    Recent Labs Lab 04/20/14 0441 04/21/14 0405 04/22/14 0405  NA 129* 134* 131*  K 3.7 3.4* 4.4  CL 97 103 100  CO2 22 20 20   BUN 15 10 11   CREATININE 0.80 0.62 0.54  GLUCOSE 94 95 141*    Recent Labs Lab 04/19/14 0410 04/20/14 0441 04/22/14 0405  HGB 12.7 12.5 13.3  HCT 37.0 36.9 38.7  WBC 8.9 7.8 9.3  PLT 145* 129* 162   Ct Chest High Resolution  04/20/2014   CLINICAL DATA:  73 year old female with history a of pneumonia 1 month ago complaining of persistent upper chest pressure and dry  nonproductive cough.  EXAM: CT CHEST WITHOUT CONTRAST  TECHNIQUE: Multidetector CT imaging of the chest was performed following the standard protocol without intravenous contrast. High resolution imaging of the lungs, as well as inspiratory and expiratory imaging, was performed.  COMPARISON:  No priors.  FINDINGS: Mediastinum: Heart size is borderline enlarged. There is no significant pericardial fluid, thickening or pericardial calcification. Multiple borderline enlarged and minimally enlarged mediastinal and bilateral hilar lymph nodes, largest of which measures up to 1 cm in the lower right paratracheal station. Esophagus is unremarkable in appearance. Atherosclerosis in the thoracic aorta and great vessels of the mediastinum.  Lungs/Pleura: There patchy predominantly peripheral opacities which are a combination of consolidative opacities and ground-glass attenuation with some inter- and interlobular septal thickening. These are randomly distributed, with a very slight basal predominance. Some thickening of the peribronchovascular interstitium is also noted. Small bilateral pleural effusions. Inspiratory and expiratory imaging is unremarkable.  Upper Abdomen: There are two low to intermediate attenuation liver lesions, which are incompletely characterized on today's noncontrast CT, largest of which measures 2.6 cm in segment 8 of the liver; these are incompletely characterized.  Musculoskeletal: There are no aggressive appearing lytic or blastic lesions noted in the visualized portions of the skeleton.  IMPRESSION: 1. Patchy multifocal opacities throughout the periphery of the lungs bilaterally, with additional findings, as detailed above. These findings  are favored to reflect a post infectious process such as cryptogenic organizing pneumonia (COP). Strictly speaking, residual multifocal infection is difficult to exclude, but is not strongly favored. 2. Small bilateral pleural effusions layering dependently. 3.  Two liver lesions, as above, incompletely characterized, but similar to remote prior study 04/05/2008, presumably cysts. 4. Atherosclerosis.   Electronically Signed   By: Vinnie Langton M.D.   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  He says there is noone there to help out during the day. The pt is very weak and son is advocating SNF rehab and I am fine with this plan.  F/u in [place] in 7-10days is recommended. I have her tentatively scheduled to see Tammy ParrettNP  At 04/28/14 1030AM for [place] WrightMD Beeper  [phone]  Cell  [phone]  If no response or cell goes to voicemail, call beeper [phone]  04/22/2014  10:29 AM

## 2014-04-23 LAB — GLUCOSE, CAPILLARY: GLUCOSE-CAPILLARY: 109 mg/dL — AB (ref 70–99)

## 2014-04-23 LAB — BASIC METABOLIC PANEL
ANION GAP: 11 (ref 5–15)
BUN: 14 mg/dL (ref 6–23)
CO2: 22 mEq/L (ref 19–32)
Calcium: 8.5 mg/dL (ref 8.4–10.5)
Chloride: 98 mEq/L (ref 96–112)
Creatinine, Ser: 0.62 mg/dL (ref 0.50–1.10)
GFR calc Af Amer: >90 mL/min (ref >90)
GFR, EST NON AFRICAN AMERICAN: 86 mL/min — AB (ref >90)
Glucose, Bld: 131 mg/dL — ABNORMAL HIGH (ref 70–99)
POTASSIUM: 4.3 meq/L (ref 3.7–5.3)
Sodium: 131 mEq/L — ABNORMAL LOW (ref 137–147)

## 2014-04-23 MED ORDER — LISINOPRIL 20 MG PO TABS
20.0000 mg | ORAL_TABLET | Freq: Every day | ORAL | Status: DC
Start: 2014-04-23 — End: 2014-06-14

## 2014-04-23 MED ORDER — FUROSEMIDE 20 MG PO TABS: 20.0000 mg | ORAL_TABLET | Freq: Every day | ORAL | Status: DC

## 2014-04-23 MED ORDER — PROPRANOLOL HCL 10 MG PO TABS: 10.0000 mg | ORAL_TABLET | Freq: Every day | ORAL | Status: DC

## 2014-04-23 MED ORDER — PREDNISONE (PAK) 10 MG PO TABS: ORAL_TABLET | Freq: Every day | ORAL | Status: DC

## 2014-04-23 MED ORDER — LISINOPRIL 20 MG PO TABS
20.0000 mg | ORAL_TABLET | Freq: Every day | ORAL | Status: DC
  Administered 2014-04-23: 20 mg via ORAL
  Filled 2014-04-23: qty 1

## 2014-04-23 NOTE — Plan of Care (Signed)
Problem: Phase I Progression Outcomes Goal: OOB as tolerated unless otherwise ordered Outcome: Completed/Met Date Met:  04/23/14 Patient up to bathroom with one assist and walker Goal: Initial discharge plan identified Outcome: Completed/Met Date Met:  04/23/14 Patient to be discharged to SNF when bed available Goal: Hemodynamically stable Outcome: Completed/Met Date Met:  04/23/14  Problem: Phase III Progression Outcomes Goal: Pain controlled on oral analgesia Outcome: Completed/Met Date Met:  04/23/14 Goal: Activity at appropriate level-compared to baseline (UP IN CHAIR FOR HEMODIALYSIS)  Outcome: Progressing Goal: Voiding independently Outcome: Progressing Goal: Foley discontinued Outcome: Not Applicable Date Met:  85/92/92 Goal: Discharge plan remains appropriate-arrangements made Outcome: Completed/Met Date Met:  04/23/14 Goal: Other Phase III Outcomes/Goals Outcome: Not Applicable Date Met:  44/62/86

## 2014-04-23 NOTE — Discharge Instructions (Signed)
Follow with Primary MD [name], MD within 3 days from discharge   Get CBC, CMP, 2 view Chest X ray checked  by Primary MD next visit.   Please follow with lobar pulmonary Tammy ParrettNP At 04/28/14 1030AM  Activity: As per PT/OT recommendation at the accepting facility   Disposition home   Diet: Heart Healthy with fluid restriction 1500 mL per day , with feeding assistance and aspiration precautions as needed.  For Heart failure patients - Check your Weight same time everyday, if you gain over 2 pounds, or you develop in leg swelling, experience more shortness of breath or chest pain, call your Primary MD immediately. 1500 lit/day fluid restriction.   On your next visit with your primary care physician please Get Medicines reviewed and adjusted.   Please request your Prim.MD to go over all Hospital Tests and Procedure/Radiological results at the follow up, please get all Hospital records sent to your Prim MD by signing hospital release before you go home.   If you experience worsening of your admission symptoms, develop shortness of breath, life threatening emergency, suicidal or homicidal thoughts you must seek medical attention immediately by calling 911 or calling your MD immediately  if symptoms less severe.  You Must read complete instructions/literature along with all the possible adverse reactions/side effects for all the Medicines you take and that have been prescribed to you. Take any new Medicines after you have completely understood and accpet all the possible adverse reactions/side effects.   Do not drive, operating heavy machinery, perform activities at heights, swimming or participation in water activities or provide baby sitting services if your were admitted for syncope or siezures until you have seen by Primary MD or a Neurologist and advised to do so again.  Do not drive when taking Pain medications.    Do not take more than prescribed Pain, Sleep and  Anxiety Medications  Special Instructions: If you have smoked or chewed Tobacco  in the last 2 yrs please stop smoking, stop any regular Alcohol  and or any Recreational drug use.  Wear Seat belts while driving.   Please note  You were cared for by a hospitalist during your hospital stay. If you have any questions about your discharge medications or the care you received while you were in the hospital after you are discharged, you can call the unit and asked to speak with the hospitalist on call if the hospitalist that took care of you is not available. Once you are discharged, your primary care physician will handle any further medical issues. Please note that NO REFILLS for any discharge medications will be authorized once you are discharged, as it is imperative that you return to your primary care physician (or establish a relationship with a primary care physician if you do not have one) for your aftercare needs so that they can reassess your need for medications and monitor your lab values.

## 2014-04-23 NOTE — Clinical Social Work Note (Signed)
CSW made aware patient ready for d/c by MD. CSW obtained authorization for an LOG and contacted the following facilities: [facility]-not contracted with insurance [employer]-unable to approve on this date [facility]-facility is able to provide a bed Frans[facility] Beau)  CSW met with patient and granddaughter at bedside, and son [name]) via telephone. CSW made patient aware of facility providing bed offer. Patient and son requested CSW contact facility offering bed in [place] [employer]). CSW made patient and son aware, with facility ([facility]) weekend admission has to be coordinated by Friday. Per patient, she is not agreeable to [facility]. Patient's son stated he is not agreeable with [facility] as he has done research and does not want to send his mother there. Patient requests to go home. Per son, he will take patient home and coordinate placement from home on Monday, 11/30 with preference facilities. CSW verbalized understanding and contacted MD (Elgergawy) and made him aware. Per MD, patient will be d/c with [facility], until SNF placement has been coordinated. CSW met with patient and contacted son who are agreeable to d/c plan home with home health services. CSW contacted RNCM Judson Roch) and made her aware. No further needs. CSW signing off.   [place], [place] Weekend Clinical Social Worker [phone]

## 2014-04-23 NOTE — Progress Notes (Addendum)
[facility] Progress Note   Carmen Duncan IWP:[id number] DOB: September 03, 1940 DOA: 04/18/2014 PCP: [name], MD  Brief narrative: Carmen Duncan is a 73 y.o. female with a past medical history of asthma, hypertension and GERD who was recently treated for bilateral pneumonia with Levaquin. She comes in to the hospital with increasing weakness and a feeling that her pneumonia may be coming back. She admits to having a cough and some chills. It appears, according to the last office note that she has a history of hyponatremia as well. Her recent lab work shows that sodium was 127 on the 11/17    Subjective: Good night sleep, no chest pain, shortness of breath, nausea or vomiting. 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 - We will hold her normal saline, start on fluid restriction, and low dose Lasix. -Continue  to hold HCTZ-sodium 131 today - We'll discontinue normal saline as patient is +5 L this admission, will keep on l fluid restriction and low dose Lasix for suspicion of SIADH.     Pneumonia-bilateral, community-acquired -Apparently was treated and she finished a 10 day course of Levaquin at a dose of 500 mg daily.  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 -Question whether she has a component of aspiration, evaluated by swallow service, no evidence of aspiration.  Acute diastolic CHF: -Patient echo showing grade 2 diastolic dysfunction,  has elevated BNP, image showing small pleural effusion. - on low dose Lasix - We will resume on ACE inhibitor .    Hypotension with a history of Hypertension -Continue propranolol but decrease dose from 40 to10 mg daily -Holding HCTZ - Continue with lisinopril  Urinary retention -Required an in and out cath 11/24 and had about 400 mL of urine in the bladder-has not urinated again will be redoing bladder scan-may need to place Foley - No further attention  Hypokalemia -Replaced    Hyperlipidemia -Resume statin    Thrombocytopenia -Appears acute and may be a related to acute infection- - monitor closely while on heparin    Malnutrition of moderate degree -Nutritional supplements added   Code Status: Full code Family Communication: Spoke with son 11/27 Disposition Plan: Patient is being discharged for SNF, awaiting insurance approval. DVT prophylaxis: Heparin  Consultants: Pulmonary  Procedures: none  Antibiotics: Anti-infectives    Start     Dose/Rate Route Frequency Ordered Stop   04/22/14 1000  azithromycin (ZITHROMAX) tablet 500 mg  Status:  Discontinued     500 mg Oral Daily 04/21/14 1356 04/22/14 0917   04/20/14 1000  azithromycin (ZITHROMAX) 500 mg in dextrose 5 % 250 mL IVPB  Status:  Discontinued     500 mg250 mL/hr over 60 Minutes Intravenous Every 24 hours 04/20/14 0842 04/21/14 1355   04/19/14 0030  cefTRIAXone (ROCEPHIN) 1 g in dextrose 5 % 50 mL IVPB - Premix  Status:  Discontinued     1 g100 mL/hr over 30 Minutes Intravenous Every 24 hours 04/19/14 0008 04/22/14 0917      Objective:  Filed Weights   04/21/14 0540 04/22/14 0414 04/23/14 0423  Weight: 67.042 kg (147 lb 12.8 oz) 68.402 kg (150 lb 12.8 oz) 68.266 kg (150 lb 8 oz)    Intake/Output Summary (Last 24 hours) at 04/23/14 1053 Last data filed at 04/23/14 0956  Gross per 24 hour  Intake   1183 ml  Output    750 ml  Net    433 ml     Vitals Filed Vitals:   04/22/14 1056 04/22/14 1517  04/22/14 2021 04/23/14 0423  BP: 138/72 153/81 134/63 126/53  Pulse: 89 100 92 86  Temp: 97.4 F (36.3 C) 97.5 F (36.4 C) 97.7 F (36.5 C) 97.5 F (36.4 C)  TempSrc: Oral Oral Oral Oral  Resp: 18 18 18 18   Height:      Weight:    68.266 kg (150 lb 8 oz)  SpO2: 100% 94% 94% 96%    Exam: General: Awake alert oriented 3 No acute respiratory distress, he was extremely weak and is very forgetful in regards to yesterday's events Lungs:  crackles at bilateral bases Cardiovascular: Regular rate and rhythm without murmur gallop or rub normal S1 and S2 Abdomen: Nontender, nondistended, soft, bowel sounds positive, no rebound, no ascites, no appreciable mass Extremities: No significant cyanosis, clubbing, or edema bilateral lower extremities  Data Reviewed: Basic Metabolic Panel:  Recent Labs Lab 04/19/14 0410 04/20/14 0441 04/21/14 0405 04/22/14 0405 04/23/14 0302  NA 130* 129* 134* 131* 131*  K 3.3* 3.7 3.4* 4.4 4.3  CL 93* 97 103 100 98  CO2 23 22 20 20 22   GLUCOSE 109* 94 95 141* 131*  BUN 24* 15 10 11 14   CREATININE 0.94 0.80 0.62 0.54 0.62  CALCIUM 7.5* 7.6* 7.4* 8.1* 8.5  MG 1.9  --   --   --   --    Liver Function Tests:  Recent Labs Lab 04/18/14 1933 04/19/14 0410  AST 63* 52*  ALT 50* 42*  ALKPHOS 40 35*  BILITOT 0.5 0.4  PROT 5.5* 4.8*  ALBUMIN 2.1* 1.8*   No results for input(s): LIPASE, AMYLASE in the last 168 hours. No results for input(s): AMMONIA in the last 168 hours. CBC:  Recent Labs Lab 04/18/14 1933 04/19/14 0410 04/20/14 0441 04/22/14 0405  WBC 11.9* 8.9 7.8 9.3  HGB 14.6 12.7 12.5 13.3  HCT 41.6 37.0 36.9 38.7  MCV 86.3 87.9 88.7 88.6  PLT 168 145* 129* 162   Cardiac Enzymes: No results for input(s): CKTOTAL, CKMB, CKMBINDEX, TROPONINI in the last 168 hours. BNP (last 3 results)  Recent Labs  04/20/14 1444  PROBNP 2447.0*   CBG:  Recent Labs Lab 04/19/14 0634 04/20/14 0750 04/21/14 0639 04/22/14 0636 04/23/14 0627   GLUCAP 100* 96 109* 124* 109*    Recent Results (from the past 240 hour(s))  Urine culture     Status: None   Collection Time: 04/18/14 10:20 PM  Result Value Ref Range Status   Specimen Description URINE, RANDOM  Final   Special Requests NONE  Final   Culture  Setup Time   Final    04/18/2014 23:08 Performed at [place] Performed at [facility]   Final   Culture NO GROWTH Performed at [facility]   Final   Report Status 04/20/2014 FINAL  Final     Studies:  Recent x-ray studies have been reviewed in detail by the Attending Physician  Scheduled Meds:  Scheduled Meds: .  acidophilus  1 capsule Oral QHS  . antiseptic oral rinse  7 mL Mouth Rinse BID  . aspirin EC  81 mg Oral Daily  . atorvastatin  20 mg Oral Daily  . dronabinol  2.5 mg Oral BID AC  . feeding supplement (ENSURE COMPLETE)  237 mL Oral TID BM  . folic acid  1 mg Oral Daily  . furosemide  20 mg Oral Daily  . heparin  5,000 Units Subcutaneous 3 times per day  . multivitamin with minerals  1 tablet Oral Daily  . nystatin  5 mL Oral QID  . pantoprazole  40 mg Oral Daily  . predniSONE  40 mg Oral Q breakfast  . propranolol  10 mg Oral QHS  . sodium chloride  3 mL Intravenous Q12H  . thiamine  100 mg Oral Daily  . topiramate  100 mg Oral QHS   Continuous Infusions:    Time spent on care of this patient: 30 min   Jaidin Ugarte, MD 04/23/2014, 10:53 AM  LOS: 5 days   [facility] Office  [phone] Pager - Text Page per [email or web address]  If 7PM-7AM, please contact night-coverage [email or web address]

## 2014-04-23 NOTE — Progress Notes (Signed)
Patient rested quietly throughout shift.  Ambulated to bathroom with one assist and rolling walker.  Denies any questions or concerns at this time.  Will continue to monitor.

## 2014-04-25 ENCOUNTER — Ambulatory Visit: Payer: Medicare Other | Admitting: Cardiology

## 2014-04-25 ENCOUNTER — Telehealth: Payer: Self-pay | Admitting: Family Medicine

## 2014-04-25 NOTE — Telephone Encounter (Signed)
Pt was dc'd from hospital on Saturday. Pt had PNA and was in the bed for so many days, they feel to needs OT and PT.  Only alternative for in-pt rehab was substandard .  Pt was told she had to go, so they brought her home. Son states he needs FL2 and admssion orders. Son states he  Has found a place to take her. [facility] [place] [place] (1st choice) [phone]   He spoke w/ Joelene Millin paige

## 2014-04-25 NOTE — Telephone Encounter (Signed)
mitch called back to let you know the place in [place] is faxing [place] the info you need.

## 2014-04-25 NOTE — Telephone Encounter (Signed)
Get FL-2 forms and let's get them filled out.

## 2014-04-26 ENCOUNTER — Telehealth: Payer: Self-pay

## 2014-04-26 NOTE — Telephone Encounter (Signed)
Dr. Winfield Rast called to give you some information about the patient. Dr. Winfield Rast call back number is [phone]

## 2014-04-27 ENCOUNTER — Encounter: Payer: Self-pay | Admitting: Internal Medicine

## 2014-04-28 ENCOUNTER — Encounter: Payer: Self-pay | Admitting: Adult Health

## 2014-04-28 ENCOUNTER — Inpatient Hospital Stay: Payer: Medicare Other | Admitting: Adult Health

## 2014-04-28 ENCOUNTER — Ambulatory Visit (INDEPENDENT_AMBULATORY_CARE_PROVIDER_SITE_OTHER)
Admission: RE | Admit: 2014-04-28 | Discharge: 2014-04-28 | Disposition: A | Discharge status: Home / Self Care | Payer: Medicare Other | Source: Ambulatory Visit | Attending: Adult Health | Admitting: Adult Health

## 2014-04-28 ENCOUNTER — Ambulatory Visit (INDEPENDENT_AMBULATORY_CARE_PROVIDER_SITE_OTHER): Payer: Medicare Other | Admitting: Adult Health

## 2014-04-28 ENCOUNTER — Other Ambulatory Visit (INDEPENDENT_AMBULATORY_CARE_PROVIDER_SITE_OTHER): Payer: Medicare Other

## 2014-04-28 VITALS — BP 126/80 | HR 81 | Temp 98.3°F | Ht 61.0 in | Wt 149.4 lb

## 2014-04-28 DIAGNOSIS — R0609 Other forms of dyspnea: Secondary | ICD-10-CM

## 2014-04-28 DIAGNOSIS — R0600 Dyspnea, unspecified: Secondary | ICD-10-CM

## 2014-04-28 DIAGNOSIS — E871 Hypo-osmolality and hyponatremia: Secondary | ICD-10-CM

## 2014-04-28 DIAGNOSIS — J84116 Cryptogenic organizing pneumonia: Secondary | ICD-10-CM

## 2014-04-28 DIAGNOSIS — J8489 Other specified interstitial pulmonary diseases: Secondary | ICD-10-CM

## 2014-04-28 DIAGNOSIS — I5032 Chronic diastolic (congestive) heart failure: Secondary | ICD-10-CM

## 2014-04-28 DIAGNOSIS — B370 Candidal stomatitis: Secondary | ICD-10-CM

## 2014-04-28 LAB — BASIC METABOLIC PANEL
BUN: 14 mg/dL (ref 6–23)
CALCIUM: 8.4 mg/dL (ref 8.4–10.5)
CO2: 23 mEq/L (ref 19–32)
Chloride: 108 mEq/L (ref 96–112)
Creatinine, Ser: 0.8 mg/dL (ref 0.4–1.2)
GFR: 78.68 mL/min (ref >60.00)
GLUCOSE: 117 mg/dL — AB (ref 70–99)
Potassium: 3.7 mEq/L (ref 3.5–5.1)
Sodium: 143 mEq/L (ref 135–145)

## 2014-04-28 LAB — BRAIN NATRIURETIC PEPTIDE: PRO B NATRI PEPTIDE: 280 pg/mL — AB (ref 0.0–100.0)

## 2014-04-28 MED ORDER — CLOTRIMAZOLE 10 MG MT TROC: 10.0000 mg | Freq: Every day | OROMUCOSAL | Status: DC

## 2014-04-28 NOTE — Assessment & Plan Note (Signed)
Mycelex troches  5 times daily for 1 week.  Please contact office for sooner follow up if symptoms do not improve or worsen or seek emergency care

## 2014-04-28 NOTE — Assessment & Plan Note (Signed)
Decompensation on steroids  Check labs with bmet /bnp  Plan  Take extra Lasix 20 mg daily for the next 3 days. Follow-up in 4 weeks with a chest x-ray with Dr. Elsworth Soho   Please contact office for sooner follow up if symptoms do not improve or worsen or seek emergency care

## 2014-04-28 NOTE — Progress Notes (Signed)
   Subjective:    Patient ID: Carmen Duncan, female    DOB: 1940-08-03, 73 y.o.   MRN: [id number]  HPI 73yo female with asthma, HTN  Seen for pulmonary consult during hospitalization for CAP for  non-resolving infiltrates.   04/28/2014 [facility] follow up  Patient returns for 1 week post hospital follow-up. Patient was recently admitted for a slow to resolve pneumonia. Patient was treated with outpatient antibiotics with no significant improvement Was admitted, started on IV antibiotics. Patient continued to have persistent infiltrates on chest x-ray Subsequent CT chest showed patchy multifocal opacities throughout the periphery of the lungs bilaterally suspicious for cryptogenic organizing pneumonia. Pulmonary was consult did . Patient was recommended to start on steroids. She was treated with IV steroids and discharged on a slow prednisone taper Swallow eval neg for aspiration  She did have some diastolic decompensation with fluid overload and elevated BNP. She was discharged on Lasix. She has noticed that her legs have been persistently swollen since discharge He denies any chest pain, orthopnea She continues to feel very weak with low energy She is going to a rehabilitation center for physical therapy She is currently on prednisone 40 mg daily . She has a tapering schedule with decrease prednisone by 10 mg every 10 days beginning on December 6 with a hold at 10 mg daily   Review of Systems Constitutional:   No  weight loss, night sweats,  Fevers, chills, + fatigue, or  lassitude.  HEENT:   No headaches,  Difficulty swallowing,  Tooth/dental problems, or  Sore throat,                No sneezing, itching, ear ache, nasal congestion, post nasal drip,   CV:  No chest pain,  Orthopnea, PND, swelling in lower extremities, anasarca, dizziness, palpitations, syncope.   GI  No heartburn, indigestion, abdominal pain, nausea, vomiting, diarrhea, change in bowel habits, loss of appetite,  bloody stools.   Resp:  No chest wall deformity  Skin: no rash or lesions.  GU: no dysuria, change in color of urine, no urgency or frequency.  No flank pain, no hematuria   MS:  No joint pain or swelling.  No decreased range of motion.  No back pain.  Psych:  No change in mood or affect. No depression or anxiety.  No memory loss.         Objective:   Physical Exam GEN: A/Ox3; pleasant , NAD, frail and elderly  HEENT:  [place]/AT,  EACs-clear, TMs-wnl, NOSE-clear, THROAT-clear, no lesions, no postnasal drip  Scattered white patches along the tongue and posterior pharynx   NECK:  Supple w/ fair ROM; no JVD; normal carotid impulses w/o bruits; no thyromegaly or nodules palpated; no lymphadenopathy.  RESP  basilar crackles .no accessory muscle use, no dullness to percussion  CARD:  RRR, no m/r/g  , 1+ peripheral edema, pulses intact, no cyanosis or clubbing.  GI:   Soft & nt; nml bowel sounds; no organomegaly or masses detected.  Musco: Warm bil, no deformities or joint swelling noted.   Neuro: alert, no focal deficits noted.    Skin: Warm, no lesions or rashes         Assessment & Plan:

## 2014-04-28 NOTE — Assessment & Plan Note (Signed)
bmet today 

## 2014-04-28 NOTE — Assessment & Plan Note (Signed)
COP - Check cxr today  Slow steroid taper with serial cxr follow up    Plan  Chest x-ray and labs today. Taper prednisone as directed. Mycelex troches  5 times daily for 1 week. Low salt diet Take extra Lasix 20 mg daily for the next 3 days. Follow-up in 4 weeks with a chest x-ray with Dr. Elsworth Soho   Please contact office for sooner follow up if symptoms do not improve or worsen or seek emergency care

## 2014-04-28 NOTE — Patient Instructions (Signed)
Chest x-ray and labs today. Taper prednisone as directed. Mycelex troches  5 times daily for 1 week. Low salt diet Take extra Lasix 20 mg daily for the next 3 days. Follow-up in 4 weeks with a chest x-ray with Dr. Elsworth Soho   Please contact office for sooner follow up if symptoms do not improve or worsen or seek emergency care

## 2014-05-03 ENCOUNTER — Telehealth: Payer: Self-pay | Admitting: Adult Health

## 2014-05-03 LAB — BASIC METABOLIC PANEL
ANION GAP: 3 — AB (ref 7–16)
BUN: 17 mg/dL (ref 7–18)
CALCIUM: 8.4 mg/dL — AB (ref 8.5–10.1)
Chloride: 103 mmol/L (ref 98–107)
Co2: 33 mmol/L — ABNORMAL HIGH (ref 21–32)
Creatinine: 0.82 mg/dL (ref 0.60–1.30)
EGFR (Non-African Amer.): >60
GLUCOSE: 84 mg/dL (ref 65–99)
Osmolality: 278 (ref 275–301)
Potassium: 3.2 mmol/L — ABNORMAL LOW (ref 3.5–5.1)
SODIUM: 139 mmol/L (ref 136–145)

## 2014-05-03 NOTE — Telephone Encounter (Signed)
Called spoke with patient's son [name] and discussed lab and xray results from the 12/3 ov Mitch voiced his understanding and denied any question/concerns - he did state that pt was changed from Furosemide to Torsemide the same day as her last ov by her provider at the nursing home ([facility] in [place] - was admitted on 12/3).  [name] is unsure of the dosing.  [facility], but automated message only and no option to try to speak with nurse - med list updated  Notes Recorded by Glean Hess, CMA on 05/03/2014 at 10:53 AM lmtcb 12/8 Notes Recorded by Melvenia Needles, NP on 04/29/2014 at 11:37 AM Labs look much better  CHF marker is tr down  NA+ is back to normal  Cont w/ ov recs  Please contact office for sooner follow up if symptoms do not improve or worsen or seek emergency care   Result Notes       Notes Recorded by Glean Hess, CMA on 05/03/2014 at 10:53 AM lmtcb 12/8 ------  Notes Recorded by Melvenia Needles, NP on 04/29/2014 at 11:44 AM Slightly increased marking with edema -  Lasix increased at ov  Cont w/ ov recs  Please contact office for sooner follow up if symptoms do not improve or worsen or seek emergency care

## 2014-05-03 NOTE — Progress Notes (Signed)
Quick Note:  Pt's son aware per 12.8.15 phone note ______

## 2014-05-27 ENCOUNTER — Encounter: Payer: Self-pay | Admitting: Internal Medicine

## 2014-06-02 ENCOUNTER — Ambulatory Visit (INDEPENDENT_AMBULATORY_CARE_PROVIDER_SITE_OTHER): Payer: Medicare Other | Admitting: Family Medicine

## 2014-06-02 ENCOUNTER — Encounter: Payer: Self-pay | Admitting: Family Medicine

## 2014-06-02 VITALS — BP 132/68 | HR 96 | Temp 97.5°F | Wt 139.0 lb

## 2014-06-02 DIAGNOSIS — G250 Essential tremor: Secondary | ICD-10-CM

## 2014-06-02 DIAGNOSIS — R600 Localized edema: Secondary | ICD-10-CM

## 2014-06-02 DIAGNOSIS — J8489 Other specified interstitial pulmonary diseases: Secondary | ICD-10-CM

## 2014-06-02 DIAGNOSIS — E871 Hypo-osmolality and hyponatremia: Secondary | ICD-10-CM

## 2014-06-02 DIAGNOSIS — E8809 Other disorders of plasma-protein metabolism, not elsewhere classified: Secondary | ICD-10-CM

## 2014-06-02 LAB — COMPREHENSIVE METABOLIC PANEL
ALT: 40 U/L — AB (ref 0–35)
AST: 36 U/L (ref 0–37)
Albumin: 3.5 g/dL (ref 3.5–5.2)
Alkaline Phosphatase: 41 U/L (ref 39–117)
BILIRUBIN TOTAL: 0.7 mg/dL (ref 0.2–1.2)
BUN: 12 mg/dL (ref 6–23)
CALCIUM: 8.9 mg/dL (ref 8.4–10.5)
CHLORIDE: 106 meq/L (ref 96–112)
CO2: 23 meq/L (ref 19–32)
Creatinine, Ser: 0.9 mg/dL (ref 0.4–1.2)
GFR: 62.32 mL/min (ref >60.00)
Glucose, Bld: 157 mg/dL — ABNORMAL HIGH (ref 70–99)
Potassium: 3.7 mEq/L (ref 3.5–5.1)
Sodium: 137 mEq/L (ref 135–145)
Total Protein: 6.8 g/dL (ref 6.0–8.3)

## 2014-06-02 NOTE — Progress Notes (Signed)
Pre visit review using our clinic review tool, if applicable. No additional management support is needed unless otherwise documented below in the visit note. 

## 2014-06-02 NOTE — Progress Notes (Signed)
Subjective:    Patient ID: Carmen Duncan, female    DOB: 22-Sep-1940, 74 y.o.   MRN: [id number]  HPI Patient seen for hospital and rehabilitation follow-up. She was admitted on 04/18/2014 following outpatient treatment for presumed bilateral pneumonia with Levaquin with progressive weakness. 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 Antibiotics were stopped and she was placed on steroids. 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 She was kept on some fluid restriction and started on Lasix and sodium levels improved.  She remains on tapering steroids and has follow-up with pulmonary tomorrow. Her respiratory status is deathly improved. No real cough at this point. No recurrent fever. She had lengthy rehabilitation stay and was discharged to home December 23. She is getting home physical therapy. She is ambulating without assistance without difficulty. Her appetite and weight have improved. She had albumin of 1.8 during hospitalization. Echo during hospital with a sugar revealed grade 2 diastolic dysfunction with elevated BNP level. She is currently back on her ACE inhibitor. She had some hypotension during hospitalization and propranolol was decreased from 40 to10 mg. She has history of essential tremor and is not seen recurrence of tremor or worsening of tremor since reducing dosage of propranolol. She also remains on Topamax for her tremor.  Past Medical History  Diagnosis Date  . Arthritis   . Asthma   . Hypertension   . GERD (gastroesophageal reflux disease)   . Hyperlipidemia   . Diverticulosis   . IBS (irritable bowel syndrome)   . HOH (hard of hearing) left ear  . Acoustic neuroma    Past Surgical History  Procedure Laterality Date  . Cholecystectomy  2011  . Abdominal hysterectomy  1980  . Tonsillectomy  1945  . Biopsy breast    . Eyes  2007    cararacts    reports that she has never smoked. She has never used smokeless  tobacco. She reports that she drinks alcohol. She reports that she does not use illicit drugs. family history includes Heart disease (age of onset: 44) in her father; Hyperlipidemia in her father and mother; Hypertension in her mother; Stroke in her father. Allergies  Allergen Reactions  . Latex Itching, Rash and Other (See Comments)    Red angry skin from latex tape  . Sulfonamide Derivatives Rash    hives  . Zocor [Simvastatin] Nausea Only      Review of Systems  Constitutional: Positive for fatigue. Negative for fever and chills.  Respiratory: Negative for cough, shortness of breath and wheezing.   Cardiovascular: Positive for leg swelling. Negative for chest pain and palpitations.  Gastrointestinal: Negative for abdominal pain.  Neurological: Negative for dizziness and syncope.  Psychiatric/Behavioral: Negative for confusion.       Objective:   Physical Exam  Constitutional: She appears well-developed and well-nourished.  Neck: Neck supple. No JVD present.  Cardiovascular: Normal rate and regular rhythm.   Pulmonary/Chest: Effort normal. No respiratory distress. She has no wheezes.  She has some crackles in both lung bases.  Musculoskeletal: She exhibits edema.  Trace pitting edema lower legs bilaterally  Psychiatric: She has a normal mood and affect. Her behavior is normal.          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 She has already had flu vaccine. Symptomatically stable and improved.  She is cautioned not to stop prednisone suddenly. #2 hyponatremia. 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 Patient off HCTZ. Recheck basic  metabolic panel  #3 history of essential tremor currently stable -recent reduction in beta blocker dose has not made much difference in severity of tremor #4 bilateral leg edema. Suspect multifactorial-diastolic heart failure, prednisone therapy, hypoalbuminemia. Continue low-dose furosemide. Frequent  elevation. Suspect this will improve as her albumin improves

## 2014-06-08 ENCOUNTER — Ambulatory Visit (INDEPENDENT_AMBULATORY_CARE_PROVIDER_SITE_OTHER)
Admission: RE | Admit: 2014-06-08 | Discharge: 2014-06-08 | Disposition: A | Discharge status: Home / Self Care | Payer: Medicare Other | Source: Ambulatory Visit | Attending: Pulmonary Disease | Admitting: Pulmonary Disease

## 2014-06-08 ENCOUNTER — Encounter: Payer: Self-pay | Admitting: Pulmonary Disease

## 2014-06-08 ENCOUNTER — Ambulatory Visit (INDEPENDENT_AMBULATORY_CARE_PROVIDER_SITE_OTHER): Payer: Medicare Other | Admitting: Pulmonary Disease

## 2014-06-08 VITALS — BP 148/78 | HR 91 | Ht 61.0 in | Wt 142.4 lb

## 2014-06-08 DIAGNOSIS — J8489 Other specified interstitial pulmonary diseases: Secondary | ICD-10-CM

## 2014-06-08 MED ORDER — PREDNISONE 5 MG PO TABS: ORAL_TABLET | ORAL | Status: DC

## 2014-06-08 NOTE — Progress Notes (Signed)
   Subjective:    Patient ID: Carmen Duncan, female    DOB: 13-Feb-1941, 74 y.o.   MRN: [id number]  HPI  74yo female with asthma, HTN Seen for pulmonary consult during hospitalization for CAP for non-resolving infiltrates.    She presented  In 03/2014 with dyspnea on exertion for several months with occasional dry cough, acute fevers with sputum production for the 2 weeks, treated with Levaquin 10 days as an outpatient by PCP, neg cardiac evaluation by Dr. Ron Parker , history of Raynaud phenomenon exam findings of bilateral coarse crackles at bases, no pedal edema or JVD & labs showing hyponatremia (chronic), absence of leukocytosis.  Imagings studies were reviewed- bibasal infiltrates seem to have improved from 04/06/14, these are new when compared to old chest x-ray from 2011. High resolution CT scan -showed peripheral patchy consolidation bilaterally-infection versus COP   She was treated with IV steroids and discharged on a slow prednisone taper Swallow eval neg for aspiration  She did have some diastolic decompensation with fluid overload and elevated BNP. She was discharged on Lasix.  06/08/2014  Chief Complaint  Patient presents with  . Follow-up    Still having some DOE; but better; swelling in ankles, feet.     Accompanied by son, she is about 80% improved She has noticed that her legs have been persistently swollen since discharge - denies any chest pain, orthopnea She continues to feel very weak with low energy She went to a rehabilitation center for physical therapy She is currently on prednisone 20 mg daily .  BNP low  Chest x-ray-market improvement in bilateral infiltrates She reports some blurriness of vision-CBG 157 week ago-has been unable to get in with her eye doctor  Past Medical History  Diagnosis Date  . Arthritis   . Asthma   . Hypertension   . GERD (gastroesophageal reflux disease)   . Hyperlipidemia   . Diverticulosis   . IBS (irritable bowel syndrome)     . HOH (hard of hearing) left ear  . Acoustic neuroma       Review of Systems neg for any significant sore throat, dysphagia, itching, sneezing, nasal congestion or excess/ purulent secretions, fever, chills, sweats, unintended wt loss, pleuritic or exertional cp, hempoptysis, orthopnea pnd or change in chronic leg swelling. Also denies presyncope, palpitations, heartburn, abdominal pain, nausea, vomiting, diarrhea or change in bowel or urinary habits, dysuria,hematuria, rash, arthralgias, visual complaints, headache, numbness weakness or ataxia.     Objective:   Physical Exam  Gen. Pleasant, well-nourished, in no distress, normal affect ENT - no lesions, no post nasal drip Neck: No JVD, no thyromegaly, no carotid bruits Lungs: no use of accessory muscles, no dullness to percussion, left basal rales, clear on right, no rhonchi  Cardiovascular: Rhythm regular, heart sounds  normal, no murmurs or gallops, no peripheral edema Abdomen: soft and non-tender, no hepatosplenomegaly, BS normal. Musculoskeletal: No deformities, no cyanosis or clubbing Neuro:  alert, non focal        Assessment & Plan:

## 2014-06-08 NOTE — Assessment & Plan Note (Addendum)
Since infiltrates have nearly resolved-and she is having some side effects of steroids including blurred vision-will slowly attempt to taper of prednisone to off over the next few weeks as below-  Decrease prednisone to 15 mg daily On feb 1 , drop to 10 mg daily On feb 15, drop to 5 mg daily , then stay at this dose CXR today Sugar slight high  Eye exam If infiltrates return-will obtain detailed immune workup including ANA

## 2014-06-08 NOTE — Patient Instructions (Signed)
Decrease prednisone to 15 mg daily On feb 1 , drop to 10 mg daily On feb 15, drop to 5 mg daily , then stay at this dose CXR today Sugar slight high  Eye exam

## 2014-06-10 DIAGNOSIS — I509 Heart failure, unspecified: Secondary | ICD-10-CM | POA: Diagnosis not present

## 2014-06-10 DIAGNOSIS — J8410 Pulmonary fibrosis, unspecified: Secondary | ICD-10-CM | POA: Diagnosis not present

## 2014-06-10 DIAGNOSIS — K589 Irritable bowel syndrome without diarrhea: Secondary | ICD-10-CM | POA: Diagnosis not present

## 2014-06-10 DIAGNOSIS — J8489 Other specified interstitial pulmonary diseases: Secondary | ICD-10-CM | POA: Diagnosis not present

## 2014-06-13 ENCOUNTER — Telehealth: Payer: Self-pay

## 2014-06-13 MED ORDER — FUROSEMIDE 20 MG PO TABS: 20.0000 mg | ORAL_TABLET | Freq: Every day | ORAL | Status: DC

## 2014-06-13 NOTE — Telephone Encounter (Signed)
Rx sent to pharmacy   

## 2014-06-13 NOTE — Telephone Encounter (Signed)
Rx request for Lasix 20 mg tablet- Take 1 tablet by mouth once daily #30  Pharm:  [facility]  Pls advise

## 2014-06-14 ENCOUNTER — Other Ambulatory Visit: Payer: Self-pay

## 2014-06-14 MED ORDER — LISINOPRIL 20 MG PO TABS: 20.0000 mg | ORAL_TABLET | Freq: Every day | ORAL | Status: DC

## 2014-06-14 NOTE — Telephone Encounter (Signed)
Rx request for lisinopril 20 mg tablet- Take 1 tablet by mouth once daily #30  Pharm:  [facility]

## 2014-07-12 ENCOUNTER — Encounter: Payer: Self-pay | Admitting: Family Medicine

## 2014-07-13 ENCOUNTER — Ambulatory Visit (INDEPENDENT_AMBULATORY_CARE_PROVIDER_SITE_OTHER): Payer: Medicare Other | Admitting: Family Medicine

## 2014-07-13 ENCOUNTER — Other Ambulatory Visit: Payer: Self-pay | Admitting: Family Medicine

## 2014-07-13 ENCOUNTER — Encounter: Payer: Self-pay | Admitting: Family Medicine

## 2014-07-13 VITALS — BP 130/70 | HR 80 | Temp 97.6°F | Wt 137.0 lb

## 2014-07-13 DIAGNOSIS — I10 Essential (primary) hypertension: Secondary | ICD-10-CM

## 2014-07-13 DIAGNOSIS — H3581 Retinal edema: Secondary | ICD-10-CM

## 2014-07-13 LAB — SEDIMENTATION RATE: SED RATE: 33 mm/h — AB (ref 0–22)

## 2014-07-13 NOTE — Progress Notes (Signed)
Pre visit review using our clinic review tool, if applicable. No additional management support is needed unless otherwise documented below in the visit note. 

## 2014-07-13 NOTE — Progress Notes (Signed)
   Subjective:    Patient ID: Carmen Duncan, female    DOB: January 06, 1941, 74 y.o.   MRN: [id number]  HPI Patient seen for follow-up recent eye exam. She went to provider in [facility]. She had dilated eye exam which showed some cotton-wool spots of her left fundus around the optic disc suggestive of hypertensive changes. Ophthalmologist specifically was concerned about ruling out temporal arthritis and embolic carotid disease. She has not had any recent TIA type symptoms. She has occasional mild bifrontal headaches but no temporal artery tenderness. No sudden vision changes. No recent appetite or weight changes. She does have long-standing history of hypertension which is been fairly well controlled with lisinopril. She also takes low-dose propranolol for essential tremor.  Past Medical History  Diagnosis Date  . Arthritis   . Asthma   . Hypertension   . GERD (gastroesophageal reflux disease)   . Hyperlipidemia   . Diverticulosis   . IBS (irritable bowel syndrome)   . HOH (hard of hearing) left ear  . Acoustic neuroma    Past Surgical History  Procedure Laterality Date  . Cholecystectomy  2011  . Abdominal hysterectomy  1980  . Tonsillectomy  1945  . Biopsy breast    . Eyes  2007    cararacts    reports that she has never smoked. She has never used smokeless tobacco. She reports that she drinks alcohol. She reports that she does not use illicit drugs. family history includes Heart disease (age of onset: 45) in her father; Hyperlipidemia in her father and mother; Hypertension in her mother; Stroke in her father. Allergies  Allergen Reactions  . Latex Itching, Rash and Other (See Comments)    Red angry skin from latex tape  . Sulfonamide Derivatives Rash    hives  . Zocor [Simvastatin] Nausea Only      Review of Systems  Constitutional: Negative for appetite change, fatigue and unexpected weight change.  Eyes: Negative for pain, redness, itching and visual disturbance.    Respiratory: Negative for cough, chest tightness, shortness of breath and wheezing.   Cardiovascular: Negative for chest pain, palpitations and leg swelling.  Neurological: Negative for dizziness, seizures, syncope, weakness, light-headedness and headaches.       Objective:   Physical Exam  Constitutional: She is oriented to person, place, and time. She appears well-developed and well-nourished.  HENT:  No temporal artery tenderness  Eyes: Pupils are equal, round, and reactive to light.  Neck: Neck supple.  No carotid bruits noted  Cardiovascular: Normal rate and regular rhythm.   Pulmonary/Chest: Effort normal and breath sounds normal. No respiratory distress. She has no wheezes. She has no rales.  Neurological: She is alert and oriented to person, place, and time. No cranial nerve deficit. Coordination normal.          Assessment & Plan:  Cotton-wool exudates noted recent left fundus exam. Clinically, she does not have high suspicion for temporal arteritis. She does not have any carotid bruits but will set up carotid Doppler and sedimentation rate to rule out the above. Suspect on the basis of hypertensive disease. Hypertension has been fairly well controlled

## 2014-07-13 NOTE — Patient Instructions (Signed)
We will call you regarding carotid doppler scan

## 2014-07-14 ENCOUNTER — Ambulatory Visit (HOSPITAL_COMMUNITY): Payer: Medicare Other | Attending: Cardiology | Admitting: Cardiology

## 2014-07-14 DIAGNOSIS — R42 Dizziness and giddiness: Secondary | ICD-10-CM | POA: Insufficient documentation

## 2014-07-14 DIAGNOSIS — H3581 Retinal edema: Secondary | ICD-10-CM | POA: Insufficient documentation

## 2014-07-14 DIAGNOSIS — H539 Unspecified visual disturbance: Secondary | ICD-10-CM | POA: Diagnosis not present

## 2014-07-14 DIAGNOSIS — R209 Unspecified disturbances of skin sensation: Secondary | ICD-10-CM | POA: Diagnosis not present

## 2014-07-14 NOTE — Progress Notes (Signed)
Carotid duplex performed 

## 2014-07-25 ENCOUNTER — Ambulatory Visit: Payer: Medicare Other | Admitting: Family Medicine

## 2014-07-29 ENCOUNTER — Encounter: Payer: Self-pay | Admitting: Family Medicine

## 2014-07-29 ENCOUNTER — Ambulatory Visit (INDEPENDENT_AMBULATORY_CARE_PROVIDER_SITE_OTHER): Payer: Medicare Other | Admitting: Family Medicine

## 2014-07-29 VITALS — BP 134/80 | HR 86 | Temp 97.9°F | Wt 134.0 lb

## 2014-07-29 DIAGNOSIS — R739 Hyperglycemia, unspecified: Secondary | ICD-10-CM

## 2014-07-29 DIAGNOSIS — R5383 Other fatigue: Secondary | ICD-10-CM

## 2014-07-29 LAB — GLUCOSE, POCT (MANUAL RESULT ENTRY): POC GLUCOSE: 81 mg/dL (ref 70–99)

## 2014-07-29 NOTE — Progress Notes (Signed)
   Subjective:    Patient ID: Carmen Duncan, female    DOB: May 01, 1941, 74 y.o.   MRN: [id number]  HPI  Patient seen for six-month follow-up. She had recent episode of BOOP.  She has been on prednisone taper- currently 5 mg daily. Her major issue is just complaints of fatigue. She's not had any urine frequency or thirst. She had A1c of 5.9% back in late November. She is sleeping fairly well. She takes several medications as reviewed. Appetite and weight have been stable. She feels her respiratory status is back to baseline. No recent cough.  Past Medical History  Diagnosis Date  . Arthritis   . Asthma   . Hypertension   . GERD (gastroesophageal reflux disease)   . Hyperlipidemia   . Diverticulosis   . IBS (irritable bowel syndrome)   . HOH (hard of hearing) left ear  . Acoustic neuroma    Past Surgical History  Procedure Laterality Date  . Cholecystectomy  2011  . Abdominal hysterectomy  1980  . Tonsillectomy  1945  . Biopsy breast    . Eyes  2007    cararacts    reports that she has never smoked. She has never used smokeless tobacco. She reports that she drinks alcohol. She reports that she does not use illicit drugs. family history includes Heart disease (age of onset: 29) in her father; Hyperlipidemia in her father and mother; Hypertension in her mother; Stroke in her father. Allergies  Allergen Reactions  . Latex Itching, Rash and Other (See Comments)    Red angry skin from latex tape  . Sulfonamide Derivatives Rash    hives  . Zocor [Simvastatin] Nausea Only     Review of Systems  Constitutional: Positive for fatigue. Negative for fever and chills.  Respiratory: Negative for cough, shortness of breath and wheezing.   Cardiovascular: Negative for chest pain.  Hematological: Negative for adenopathy.       Objective:   Physical Exam  Constitutional: She appears well-developed and well-nourished. No distress.  Neck: Neck supple.  Cardiovascular: Normal rate and  regular rhythm.   Pulmonary/Chest: Effort normal and breath sounds normal. She has no wheezes.  Musculoskeletal: She exhibits no edema.  Lymphadenopathy:    She has no cervical adenopathy.          Assessment & Plan:  Nonspecific fatigue. We discussed the fact this may be simple related to her recent extensive illness and recovery process. She does not have any symptoms of hyperglycemia such as polyuria or polydipsia. Will check blood sugar today here in office. Gradually increase her activities. She has follow-up with pulmonary soon.

## 2014-07-29 NOTE — Progress Notes (Signed)
Pre visit review using our clinic review tool, if applicable. No additional management support is needed unless otherwise documented below in the visit note. 

## 2014-08-04 ENCOUNTER — Ambulatory Visit (INDEPENDENT_AMBULATORY_CARE_PROVIDER_SITE_OTHER): Payer: Medicare Other | Admitting: Neurology

## 2014-08-04 ENCOUNTER — Telehealth: Payer: Self-pay | Admitting: *Deleted

## 2014-08-04 ENCOUNTER — Encounter: Payer: Self-pay | Admitting: Neurology

## 2014-08-04 VITALS — BP 148/80 | HR 100 | Ht 61.0 in | Wt 132.0 lb

## 2014-08-04 DIAGNOSIS — G629 Polyneuropathy, unspecified: Secondary | ICD-10-CM

## 2014-08-04 DIAGNOSIS — G250 Essential tremor: Secondary | ICD-10-CM

## 2014-08-04 NOTE — Telephone Encounter (Signed)
Left message on machine for patient's son to call back.   

## 2014-08-04 NOTE — Telephone Encounter (Signed)
Spoke with patient's son and he states that patient was confused about her medication in the office and when she said that Propranolol was reduced to 5 mg that she was thinking about the Prednisone. She is currently taking 10 mg of Propranolol and 5 mg of Prednisone.

## 2014-08-04 NOTE — Telephone Encounter (Signed)
Patient son would like for you to give him a call not really sure if Dr. Carles Collet is clear about her medication change(?) C/B (mitch) [phone] in meeting today from 2-3

## 2014-08-04 NOTE — Progress Notes (Signed)
Subjective:   Carmen Duncan was seen in consultation in the movement disorder clinic at the request of Carmen Post, MD.  The evaluation is for tremor.  The patient is a 74 y.o. R handed female with a long hx of tremor.   The patient states that the tremor is in the L hand.  She notes it with activation.  If pouring something from a saucepan, it will start to tremor.  It seems to be worse in specific positions.  She has no trouble with putting on makeup or eating as this only affects her nondominant hand.  She has no trouble cutting food.  She has no vocal tremor, head tremor or RUE tremor.  She has no leg tremor.  Sometimes she feels like she is "walking like a drunk" because she is not stable.  She does not feel comfortable near the edge of a pier b/c she feels like she would fall off the edge.  She generally does not fall.  She did fall Jul 16, 2011 because she missed the last 3 steps at her home when she was coming down in a hurry.  She bruised her entire R leg and it is still very sore.    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  I do not have those records.  She saw Dr. [name].  She is on propranolol and she has been on it 7-8 years.  She is on 40 mg twice per day.  It does help.  She states that she was told that her propranolol could not be increased.  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  She sees a Publishing rights manager at [facility] and was told it was nothing to worry about.  She has it monitored q 3 years.  Her last MRI brain was done in [place].   Because of this, she will have intermittent numbness of the L face.    There was a fam hx of tremor in her sister and it is starting in her son, who is 38 years old.  Current/Previously tried tremor medications: propranolol, primidone  Current medications that may exacerbate tremor:  N/a  12/03/12:  She was started on primidone last visit.  She is only taking 1/2 of  a 50 mg tablet at night.  She didn't go up to a full tablet because she did not like the way that it made her feel.  She wants to d/c the medication.  She is on propranolol but the dose cannot be increased because her pulse is already on the low end of normal.  She is having some intermittent paresthesias in her fingertips, right more than left.  03/12/13:  Topamax was added last visit, which has helped but has decreased appetite.  She tremors much less now but notices it when stressed, fatigued or under pressure.  She c/o constipation with the topamax.  She has taste aversion with the medication.  She has some paresthesias with the medication.  She remains on propranolol but is off of the primidone.  She thinks that the SE are worth continuing to take the medication.  She has had worsening of vision but thinks that she had that prior to the topamax and has an eye appt Nov 4.  She worked on last visit.  B12 was reported.  Serum protein electrophoresis did not reveal M spike.  RPR was negative.  Urinary protein electrophoresis  showed some free kappa light chains but there was no monoclonal protein, no M spike.  RPR was negative.  07/28/13 update:  The pt was on topamax 200 mg daily.  She was unable to tolerate bid dosing of the medication.  She ended up going back to 100 mg at night and it makes her sleepy at night when she takes the medication but no hang over effect.  She still has some intermittent paresthesias.    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  She does c/o dull fronal headaches, daily for the last month.  She uses sinus rinse and it helps.  States that tremor has been stable.    02/03/14 update:  The patient has a history of essential tremor, and is currently on propranolol 40 mg once a day as well as Topamax 100 mg daily.   Unless she is very tired or upset, she  does well in regards to tremor.   She received notes from [name] at [facility] since last visit.  He just said that her schwannoma was stable in size and she just needs a repeat MRI for 10 years that they will plan on doing.  She did have a repeat urinary protein electrophoresis since our last visit that was unremarkable.  I had the opportunity to review notes from her primary care physician from 01/20/2014.  There were 2 brief episodes that the patient describes in which she was walking and her legs felt weak.  She felt near syncopal.  She was not dizzy but just stated that "I couldn't have moved if I had to."    It only lasted for a few minutes.  She had already decreased the propranolol on her own when these episodes happened.  She did that primarily because she would forget to take it bid but also because of fatigue.    08/04/14 update:  The patient returns today for follow-up.  Much has happened since her last visit.  Medical records were reviewed.  She has a history of essential tremor and was on propranolol 40 mg, but that has since been reduced to what medical records said was 10 mg daily but pt states is 5 mg daily because of other medical issues.  She remains on Topamax 100 mg daily.  In November, 2015, she went to her primary care physician complaining about malaise and she had a nuclear stress test that was a low risk study with a left ventricular ejection fraction of 83%.  She saw cardiology not long thereafter and ended up having a chest x-ray demonstrating bibasilar infiltrates.  A few days later she developed a fever and was treated for community-acquired pneumonia.  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  During that hospitalization, her propranolol was decreased from 40 mg daily to 10 mg daily because of hypotension.  She became more weak during that hospitalization and was discharged to a subacute rehabilitation facility and ultimately left that rehabilitation  facility and was discharged home on December 23.  She does think that tremor may have increased slightly.  I reviewed her MRI of the brain.  This revealed moderate white matter disease.  There was evidence of a known acoustic neuroma.  Outside reports reviewed: historical medical records.  Allergies  Allergen Reactions  . Latex Itching, Rash and Other (See Comments)  Red angry skin from latex tape  . Sulfonamide Derivatives Rash    hives  . Zocor [Simvastatin] Nausea Only    Current Outpatient Prescriptions on File Prior to Visit  Medication Sig Dispense Refill  . acetaminophen (TYLENOL) 325 MG tablet Take 650 mg by mouth every 6 (six) hours as needed for headache.    [name] aspirin 81 MG tablet Take 81 mg by mouth daily.      [name] atorvastatin (LIPITOR) 20 MG tablet Take 1 tablet (20 mg total) by mouth daily. 90 tablet 2  . cetirizine (ZYRTEC) 10 MG tablet Take 10 mg by mouth daily. Seasonally:  As needed    . furosemide (LASIX) 20 MG tablet Take 1 tablet (20 mg total) by mouth daily. 30 tablet 5  . lisinopril (PRINIVIL,ZESTRIL) 20 MG tablet Take 1 tablet (20 mg total) by mouth daily. 30 tablet 3  . omeprazole (PRILOSEC OTC) 20 MG tablet Take 20 mg by mouth daily.     . predniSONE (DELTASONE) 5 MG tablet Take once daily 60 tablet 1  . propranolol (INDERAL) 10 MG tablet Take 1 tablet (10 mg total) by mouth at bedtime. 30 tablet 0  . topiramate (TOPAMAX) 50 MG tablet Take 2 tablets (100 mg total) by mouth at bedtime. 60 tablet 5   No current facility-administered medications on file prior to visit.    Past Medical History  Diagnosis Date  . Arthritis   . Asthma   . Hypertension   . GERD (gastroesophageal reflux disease)   . Hyperlipidemia   . Diverticulosis   . IBS (irritable bowel syndrome)   . HOH (hard of hearing) left ear  . Acoustic neuroma     Past Surgical History  Procedure Laterality Date  . Cholecystectomy  2011  . Abdominal hysterectomy  1980  . Tonsillectomy  1945   . Biopsy breast    . Eyes  2007    cararacts    History   Social History  . Marital Status: Widowed    Spouse Name: N/A  . Number of Children: N/A  . Years of Education: N/A   Occupational History  . Not on file.   Social History Main Topics  . Smoking status: Never Smoker   . Smokeless tobacco: Never Used  . Alcohol Use: Yes     Comment: wine about 3 times a week  . Drug Use: No  . Sexual Activity:    Partners: Female   Other Topics Concern  . Not on file   Social History Narrative    Family Status  Relation Status Death Age  . Mother Deceased     acute leukemia  . Father Deceased     MI, CVA  . Sister Alive     tremor  . Child Alive     son, mild tremor    Review of Systems A complete 10 system ROS was obtained and was negative apart from what is mentioned.   Objective:   VITALS:   Filed Vitals:   08/04/14 1243  BP: 148/80  Pulse: 100  Height: 5\' 1"  (1.549 m)  Weight: 132 lb (59.875 kg)   Wt Readings from Last 3 Encounters:  08/04/14 132 lb (59.875 kg)  07/29/14 134 lb (60.782 kg)  07/13/14 137 lb (62.143 kg)   Gen:  Appears stated age and in NAD. HEENT:  Normocephalic, atraumatic. The mucous membranes are moist. The superficial temporal arteries are without ropiness or tenderness. Cardiovascular: Regular rate and rhythm. Lungs: Clear to auscultation  bilaterally. Neck: There are no carotid bruits noted bilaterally.  NEUROLOGICAL:  Orientation:  The patient is alert and oriented x 3.  Recent and remote memory are intact.  Attention span and concentration are normal.  Able to name objects and repeat without trouble.  Fund of knowledge is appropriate Cranial nerves: There is good facial symmetry.  There is pseudoptosis from lid lag bilaterally.   The pupils are equal round and reactive to light bilaterally. Fundoscopic exam reveals clear disc margins bilaterally. Extraocular muscles are intact and visual fields are full to confrontational  testing. Speech is fluent and clear. Soft palate rises symmetrically and there is no tongue deviation. Hearing is intact to conversational tone. Tone: Tone is good throughout.  There is no rigidity. Sensation: Sensation is intact to light touch and pinprick throughout (facial, trunk, extremities). Vibration is decreased at the bilateral big toe. There is no extinction with double simultaneous stimulation. There is no sensory dermatomal level identified. Coordination:  The patient has no dysdiadichokinesia or dysmetria. Motor: Strength is 5/5 in the bilateral upper and lower extremities.  Shoulder shrug is equal bilaterally.  There is no pronator drift.  There are no fasciculations noted. DTR's: Deep tendon reflexes are 1/4 at the bilateral biceps, triceps, brachioradialis, left patella (did not test at the right patella because of pain) and absent at the bilateral achilles.  Plantar responses are downgoing bilaterally. Gait and Station: The patient is able to ambulate without difficulty. She has some difficulty ambulating in a tandem fashion.  She sways in the Romberg position, but was able to stay in place with eyes closed.  MOVEMENT EXAM: Tremor:  There is minimal tremor of L hand, most obvious when trying to draw archimedes spirals.   She is able to pour water from one glass to another without a problem.   LABS  Lab Results  Component Value Date   WBC 9.3 04/22/2014   HGB 13.3 04/22/2014   HCT 38.7 04/22/2014   MCV 88.6 04/22/2014   PLT 162 04/22/2014     Chemistry      Component Value Date/Time   NA 137 06/02/2014 1554   K 3.7 06/02/2014 1554   CL 106 06/02/2014 1554   CO2 23 06/02/2014 1554   BUN 12 06/02/2014 1554   CREATININE 0.9 06/02/2014 1554      Component Value Date/Time   CALCIUM 8.9 06/02/2014 1554   ALKPHOS 41 06/02/2014 1554   AST 36 06/02/2014 1554   ALT 40* 06/02/2014 1554   BILITOT 0.7 06/02/2014 1554     Lab Results  Component Value Date   TSH 1.500  04/18/2014      Assessment/Plan:   1.  Essential Tremor.  -Pt was on propranolol 40 mg daily but that was reduced to 10 mg daily when in the hospital for pneumonia when she developed hypotension.  She surely wasn't hypotensive today (quite the opposite) and states that she is actually only on 5 mg now and even though she thinks that tremor increased a little she doesn't want to increase the propranolol yet and I am find with that.  We can re-visit that in the future if needed.    -We talked about the topamax.  She would like to continue on the medication, 100 mg nightly.   There is no history of nephrolithiasis.  There is no history of acute angle closure glaucoma. 2.  Mild gait instability.  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  This seems overall very  mild.  We talked about the diagnosis.  -w/u for reversible causes was negative  -We talked about safety associated with peripheral neuropathy. 3.  Acoustic neuroma.  -This is being followed by ENT and neurosurgery at [facility].  Recent notes in 2015 said that she does not need an MRI for another 10 years. 4.  F/u 6 months, sooner should new neuro issues arise

## 2014-08-07 ENCOUNTER — Other Ambulatory Visit: Payer: Self-pay | Admitting: Neurology

## 2014-08-08 ENCOUNTER — Encounter: Payer: Self-pay | Admitting: Adult Health

## 2014-08-08 ENCOUNTER — Ambulatory Visit (INDEPENDENT_AMBULATORY_CARE_PROVIDER_SITE_OTHER): Payer: Medicare Other | Admitting: Adult Health

## 2014-08-08 VITALS — BP 128/88 | HR 89 | Ht 61.0 in | Wt 132.0 lb

## 2014-08-08 DIAGNOSIS — J8489 Other specified interstitial pulmonary diseases: Secondary | ICD-10-CM

## 2014-08-08 NOTE — Patient Instructions (Signed)
Decrease Prednisone 5mg  every other day for 2 weeks then stop  Follow up Dr. Elsworth Soho in 6 weeks and As needed

## 2014-08-08 NOTE — Telephone Encounter (Signed)
Topamax refill requested. Per last office note- patient to remain on medication. Refill approved and sent to patient's pharmacy.   

## 2014-08-14 NOTE — Progress Notes (Signed)
   Subjective:    Patient ID: Carmen Duncan, female    DOB: 11-18-1940, 74 y.o.   MRN: [id number]  HPI  74yo female with asthma, HTN Seen for pulmonary consult during hospitalization for CAP for non-resolving infiltrates.    She presented  In 03/2014 with dyspnea on exertion for several months with occasional dry cough, acute fevers with sputum production for the 2 weeks, treated with Levaquin 10 days as an outpatient by PCP, neg cardiac evaluation by Dr. Ron Parker , history of Raynaud phenomenon exam findings of bilateral coarse crackles at bases, no pedal edema or JVD & labs showing hyponatremia (chronic), absence of leukocytosis.  Imagings studies were reviewed- bibasal infiltrates seem to have improved from 04/06/14, these are new when compared to old chest x-ray from 2011. High resolution CT scan -showed peripheral patchy consolidation bilaterally-infection versus COP   She was treated with IV steroids and discharged on a slow prednisone taper Swallow eval neg for aspiration  She did have some diastolic decompensation with fluid overload and elevated BNP. She was discharged on Lasix.  Accompanied by son, she is about 80% improved She has noticed that her legs have been persistently swollen since discharge - denies any chest pain, orthopnea She continues to feel very weak with low energy She went to a rehabilitation center for physical therapy She is currently on prednisone 20 mg daily .  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  CXR last ov with marked improvement in bilateral infiltrates.  Steroids were tapered down slowly to 5mg  daily  Says she is doing well with no flare of cough or dyspnea.  Feels much better.  Denies chest pain, orthopnea, edema  or hemoptysis.    Past Medical History  Diagnosis Date  . Arthritis   . Asthma   . Hypertension   . GERD (gastroesophageal reflux disease)   . Hyperlipidemia   . Diverticulosis   . IBS (irritable bowel syndrome)   . HOH (hard of hearing) left ear  . Acoustic neuroma       Review of Systems neg for any significant sore throat, dysphagia, itching, sneezing, nasal congestion or excess/ purulent secretions, fever, chills, sweats, unintended wt loss, pleuritic or exertional cp, hempoptysis, orthopnea pnd or change in chronic leg swelling. Also denies presyncope, palpitations, heartburn, abdominal pain, nausea, vomiting, diarrhea or change in bowel or urinary habits, dysuria,hematuria, rash, arthralgias, visual complaints, headache, numbness weakness or ataxia.     Objective:   Physical Exam  Gen. Pleasant, elderly  in no distress, normal affect ENT - no lesions, no post nasal drip Neck: No JVD, no thyromegaly, no carotid bruits Lungs: no use of accessory muscles, no dullness to percussion,  Decreased BS in bases  Cardiovascular: Rhythm regular, heart sounds  normal, no murmurs or gallops, no peripheral edema Abdomen: soft and non-tender, no hepatosplenomegaly, BS normal. Musculoskeletal: No deformities, no cyanosis or clubbing Neuro:  alert, non focal        Assessment & Plan:

## 2014-08-14 NOTE — Assessment & Plan Note (Signed)
Clnically improved , no flare in tapering steroids  Will taper steroids to off   Plan  Decrease Prednisone 5mg  every other day for 2 weeks then stop  Follow up Dr. Elsworth Soho in 6 weeks and As needed

## 2014-08-23 ENCOUNTER — Ambulatory Visit: Payer: Self-pay | Admitting: Ophthalmology

## 2014-08-25 ENCOUNTER — Telehealth: Payer: Self-pay | Admitting: Family Medicine

## 2014-08-25 MED ORDER — ATORVASTATIN CALCIUM 20 MG PO TABS: 20.0000 mg | ORAL_TABLET | Freq: Every day | ORAL | Status: DC

## 2014-08-25 NOTE — Telephone Encounter (Signed)
Rx sent to pharmacy. Pt has appointment 08/26/14

## 2014-08-25 NOTE — Telephone Encounter (Signed)
Recommend follow up.  Would try to avoid benzodiazepines at her age.  Will discuss options with her.

## 2014-08-25 NOTE — Telephone Encounter (Signed)
Pt needs new rx atorvastatin 20 mg #30 with refills [facility] on [address]. Pt will no longer do mail order. Pt would like something (med) to take edge off  her pt is having a little anxiety.

## 2014-08-26 ENCOUNTER — Ambulatory Visit (INDEPENDENT_AMBULATORY_CARE_PROVIDER_SITE_OTHER): Payer: Medicare Other | Admitting: Family Medicine

## 2014-08-26 ENCOUNTER — Encounter: Payer: Self-pay | Admitting: Family Medicine

## 2014-08-26 VITALS — BP 130/80 | HR 90 | Temp 98.2°F | Wt 131.0 lb

## 2014-08-26 DIAGNOSIS — R1013 Epigastric pain: Secondary | ICD-10-CM

## 2014-08-26 DIAGNOSIS — K30 Functional dyspepsia: Secondary | ICD-10-CM

## 2014-08-26 MED ORDER — CILIDINIUM-CHLORDIAZEPOXIDE 2.5-5 MG PO CAPS: 1.0000 | ORAL_CAPSULE | Freq: Two times a day (BID) | ORAL | Status: DC | PRN

## 2014-08-26 NOTE — Progress Notes (Signed)
   Subjective:    Patient ID: Carmen Duncan, female    DOB: [birth year], 74 y.o.   MRN: [id number]  HPI Patient called yesterday with anxiety issues. She has long history of anxiety and gastrointestinal issues. Several years ago she was placed by another physician on Librax which she takes very infrequently. She has days where if she doesn't sleep she feels more anxious and has difficulty "digesting her food ". This does not occur daily. She's not had a consistent diarrhea symptoms. No vomiting. No recent appetite or weight changes. She does not recall any prior histories with side effects of this medication. She has no history of glaucoma. No history of bowel obstruction or other contraindication. Denies any depressive symptoms  Past Medical History  Diagnosis Date  . Arthritis   . Asthma   . Hypertension   . GERD (gastroesophageal reflux disease)   . Hyperlipidemia   . Diverticulosis   . IBS (irritable bowel syndrome)   . HOH (hard of hearing) left ear  . Acoustic neuroma    Past Surgical History  Procedure Laterality Date  . Cholecystectomy  2011  . Abdominal hysterectomy  1980  . Tonsillectomy  1945  . Biopsy breast    . Eyes  2007    cararacts    reports that she has never smoked. She has never used smokeless tobacco. She reports that she drinks alcohol. She reports that she does not use illicit drugs. family history includes Heart disease (age of onset: 85) in her father; Hyperlipidemia in her father and mother; Hypertension in her mother; Stroke in her father. Allergies  Allergen Reactions  . Latex Itching, Rash and Other (See Comments)    Red angry skin from latex tape  . Sulfonamide Derivatives Rash    hives  . Zocor [Simvastatin] Nausea Only      Review of Systems  Constitutional: Negative for fever, chills and appetite change.  Respiratory: Negative for shortness of breath.   Cardiovascular: Negative for chest pain.  Gastrointestinal: Negative for nausea, vomiting  and constipation.  Psychiatric/Behavioral: Negative for dysphoric mood. The patient is nervous/anxious.        Objective:   Physical Exam  Constitutional: She appears well-developed and well-nourished.  Cardiovascular: Normal rate and regular rhythm.   Pulmonary/Chest: Effort normal and breath sounds normal. No respiratory distress. She has no wheezes. She has no rales.  Abdominal: Soft. There is no tenderness.          Assessment & Plan:  Patient has long history of anxiety and "indigestion". She's been on Librax in the past which helps and she takes this very infrequently. No absolute contraindications. We have explained to use this with caution- especially with constipation.

## 2014-08-26 NOTE — Progress Notes (Signed)
Pre visit review using our clinic review tool, if applicable. No additional management support is needed unless otherwise documented below in the visit note. 

## 2014-08-30 ENCOUNTER — Encounter: Payer: Self-pay | Admitting: Neurology

## 2014-08-30 ENCOUNTER — Ambulatory Visit (INDEPENDENT_AMBULATORY_CARE_PROVIDER_SITE_OTHER): Payer: Medicare Other | Admitting: Neurology

## 2014-08-30 VITALS — BP 146/80 | HR 80 | Ht 61.0 in | Wt 132.0 lb

## 2014-08-30 DIAGNOSIS — I638 Other cerebral infarction: Secondary | ICD-10-CM

## 2014-08-30 DIAGNOSIS — I6389 Other cerebral infarction: Secondary | ICD-10-CM

## 2014-08-30 DIAGNOSIS — R9389 Abnormal findings on diagnostic imaging of other specified body structures: Secondary | ICD-10-CM

## 2014-08-30 DIAGNOSIS — I639 Cerebral infarction, unspecified: Secondary | ICD-10-CM | POA: Diagnosis not present

## 2014-08-30 DIAGNOSIS — D333 Benign neoplasm of cranial nerves: Secondary | ICD-10-CM

## 2014-08-30 DIAGNOSIS — R938 Abnormal findings on diagnostic imaging of other specified body structures: Secondary | ICD-10-CM | POA: Diagnosis not present

## 2014-08-30 MED ORDER — CLOPIDOGREL BISULFATE 75 MG PO TABS: 75.0000 mg | ORAL_TABLET | Freq: Every day | ORAL | Status: DC

## 2014-08-30 NOTE — Patient Instructions (Addendum)
1. Stop aspirin.  2. Start Plavix 75 mg tablets once daily. RX sent to your pharmacy. We will talk to Dr Elease Hashimoto about your Omeprazole.  3. We have scheduled you at [facility] for your MRI on 11/29/2014 at 12:00 pm. Please arrive 15 minutes prior and go to 1st floor radiology. If you need to reschedule for any reason please call [phone]. 4. Appt scheduled with Dr Ron Parker on 10/10/14 at 9:30 am for follow up.

## 2014-08-30 NOTE — Progress Notes (Signed)
Subjective:   Carmen Duncan was seen in consultation in the movement disorder clinic at the request of Carmen Post, MD.  The evaluation is for tremor.  The patient is a 74 y.o. R handed female with a long hx of tremor.   The patient states that the tremor is in the L hand.  She notes it with activation.  If pouring something from a saucepan, it will start to tremor.  It seems to be worse in specific positions.  She has no trouble with putting on makeup or eating as this only affects her nondominant hand.  She has no trouble cutting food.  She has no vocal tremor, head tremor or RUE tremor.  She has no leg tremor.  Sometimes she feels like she is "walking like a drunk" because she is not stable.  She does not feel comfortable near the edge of a pier b/c she feels like she would fall off the edge.  She generally does not fall.  She did fall Jul 16, 2011 because she missed the last 3 steps at her home when she was coming down in a hurry.  She bruised her entire R leg and it is still very sore.    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  I do not have those records.  She saw Dr. [name].  She is on propranolol and she has been on it 7-8 years.  She is on 40 mg twice per day.  It does help.  She states that she was told that her propranolol could not be increased.  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  She sees a Publishing rights manager at [facility] and was told it was nothing to worry about.  She has it monitored q 3 years.  Her last MRI brain was done in [place].   Because of this, she will have intermittent numbness of the L face.    There was a fam hx of tremor in her sister and it is starting in her son, who is 63 years old.  Current/Previously tried tremor medications: propranolol, primidone  Current medications that may exacerbate tremor:  N/a  12/03/12:  She was started on primidone last visit.  She is only taking 1/2 of  a 50 mg tablet at night.  She didn't go up to a full tablet because she did not like the way that it made her feel.  She wants to d/c the medication.  She is on propranolol but the dose cannot be increased because her pulse is already on the low end of normal.  She is having some intermittent paresthesias in her fingertips, right more than left.  03/12/13:  Topamax was added last visit, which has helped but has decreased appetite.  She tremors much less now but notices it when stressed, fatigued or under pressure.  She c/o constipation with the topamax.  She has taste aversion with the medication.  She has some paresthesias with the medication.  She remains on propranolol but is off of the primidone.  She thinks that the SE are worth continuing to take the medication.  She has had worsening of vision but thinks that she had that prior to the topamax and has an eye appt Nov 4.  She worked on last visit.  B12 was reported.  Serum protein electrophoresis did not reveal M spike.  RPR was negative.  Urinary protein electrophoresis  showed some free kappa light chains but there was no monoclonal protein, no M spike.  RPR was negative.  07/28/13 update:  The pt was on topamax 200 mg daily.  She was unable to tolerate bid dosing of the medication.  She ended up going back to 100 mg at night and it makes her sleepy at night when she takes the medication but no hang over effect.  She still has some intermittent paresthesias.    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  She does c/o dull fronal headaches, daily for the last month.  She uses sinus rinse and it helps.  States that tremor has been stable.    02/03/14 update:  The patient has a history of essential tremor, and is currently on propranolol 40 mg once a day as well as Topamax 100 mg daily.   Unless she is very tired or upset, she  does well in regards to tremor.   She received notes from [name] at [facility] since last visit.  He just said that her schwannoma was stable in size and she just needs a repeat MRI for 10 years that they will plan on doing.  She did have a repeat urinary protein electrophoresis since our last visit that was unremarkable.  I had the opportunity to review notes from her primary care physician from 01/20/2014.  There were 2 brief episodes that the patient describes in which she was walking and her legs felt weak.  She felt near syncopal.  She was not dizzy but just stated that "I couldn't have moved if I had to."    It only lasted for a few minutes.  She had already decreased the propranolol on her own when these episodes happened.  She did that primarily because she would forget to take it bid but also because of fatigue.    08/04/14 update:  The patient returns today for follow-up.  Much has happened since her last visit.  Medical records were reviewed.  She has a history of essential tremor and was on propranolol 40 mg, but that has since been reduced to what medical records said was 10 mg daily but pt states is 5 mg daily because of other medical issues.  She remains on Topamax 100 mg daily.  In November, 2015, she went to her primary care physician complaining about malaise and she had a nuclear stress test that was a low risk study with a left ventricular ejection fraction of 83%.  She saw cardiology not long thereafter and ended up having a chest x-ray demonstrating bibasilar infiltrates.  A few days later she developed a fever and was treated for community-acquired pneumonia.  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  During that hospitalization, her propranolol was decreased from 40 mg daily to 10 mg daily because of hypotension.  She became more weak during that hospitalization and was discharged to a subacute rehabilitation facility and ultimately left that rehabilitation  facility and was discharged home on December 23.  She does think that tremor may have increased slightly.  08/30/14 update:  The patient is following up today at the request of her ophthalmologist.  Pt states that she just went for a "normal checkup" because her vision had changed a little and "I needed new frames."  She states that her eye doctor thought that  there was a stroke behind the left eye and that led to an MRI of the brain and MRA of the brain on 08/23/2014.  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  There was an enhancing lesion in the right parietal region as well.  I talked on the phone with the radiologist about this lesion and he felt that this was likely a subacute vascular insult and not a metastatic lesion.  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  She had a carotid ultrasound on 07/14/2014 demonstrating 1-39% stenosis bilaterally.  She did an echocardiogram last year when she was in the hospital on 04/20/2014 demonstrating an ejection fraction of 65-70%, moderate diastolic dysfunction and moderate pulmonary HTN  Outside reports reviewed: historical medical records.  Allergies  Allergen Reactions  . Latex Itching, Rash and Other (See Comments)    Red angry skin from latex tape  . Sulfonamide Derivatives Rash    hives  . Zocor [Simvastatin] Nausea Only    Current Outpatient Prescriptions on File Prior to Visit  Medication Sig Dispense Refill  . acetaminophen (TYLENOL) 325 MG tablet Take 650 mg by mouth every 6 (six) hours as needed for headache.    [name] aspirin 81 MG tablet Take 81 mg by mouth daily.      [name] atorvastatin (LIPITOR) 20 MG tablet Take 1 tablet (20 mg total) by mouth daily. 30 tablet 5  . cetirizine (ZYRTEC) 10 MG tablet Take 10 mg by mouth daily. Seasonally:  As needed    .  clidinium-chlordiazePOXIDE (LIBRAX) 5-2.5 MG per capsule Take 1 capsule by mouth 2 (two) times daily as needed. 60 capsule 3  . furosemide (LASIX) 20 MG tablet Take 1 tablet (20 mg total) by mouth daily. 30 tablet 5  . lisinopril (PRINIVIL,ZESTRIL) 20 MG tablet Take 1 tablet (20 mg total) by mouth daily. 30 tablet 3  . omeprazole (PRILOSEC OTC) 20 MG tablet Take 20 mg by mouth daily.     . propranolol (INDERAL) 10 MG tablet Take 1 tablet (10 mg total) by mouth at bedtime. 30 tablet 0  . topiramate (TOPAMAX) 50 MG tablet TAKE TWO TABLETS BY MOUTH AT BEDTIME. 60 tablet 5   No current facility-administered medications on file prior to visit.    Past Medical History  Diagnosis Date  . Arthritis   . Asthma   . Hypertension   . GERD (gastroesophageal reflux disease)   . Hyperlipidemia   . Diverticulosis   . IBS (irritable bowel syndrome)   . HOH (hard of hearing) left ear  . Acoustic neuroma     Past Surgical History  Procedure Laterality Date  . Cholecystectomy  2011  . Abdominal hysterectomy  1980  . Tonsillectomy  1945  . Biopsy breast    . Eyes  2007    cararacts    History   Social History  . Marital Status: Widowed    Spouse Name: N/A  . Number of Children: N/A  . Years of Education: N/A   Occupational History  . Not on file.   Social History Main Topics  . Smoking status: Never Smoker   . Smokeless tobacco: Never Used  . Alcohol Use: Yes     Comment: wine about 3 times a week  . Drug Use: No  . Sexual Activity:    Partners: Female  Other Topics Concern  . Not on file   Social History Narrative    Family Status  Relation Status Death Age  . Mother Deceased     acute leukemia  . Father Deceased     MI, CVA  . Sister Alive     tremor  . Child Alive     son, mild tremor    Review of Systems A complete 10 system ROS was obtained and was negative apart from what is mentioned.   Objective:   VITALS:   Filed Vitals:   08/30/14 0927  BP: 146/80   Pulse: 80  Height: 5\' 1"  (1.549 m)  Weight: 132 lb (59.875 kg)   Wt Readings from Last 3 Encounters:  08/30/14 132 lb (59.875 kg)  08/26/14 131 lb (59.421 kg)  08/08/14 132 lb (59.875 kg)   Gen:  Appears stated age and in NAD. HEENT:  Normocephalic, atraumatic. The mucous membranes are moist. The superficial temporal arteries are without ropiness or tenderness. Cardiovascular: Regular rate and rhythm. Lungs: Clear to auscultation bilaterally. Neck: There are no carotid bruits noted bilaterally.  NEUROLOGICAL:  Orientation:  The patient is alert and oriented x 3.  Recent and remote memory are intact.  Attention span and concentration are normal.  Able to name objects and repeat without trouble.  Fund of knowledge is appropriate Cranial nerves: There is good facial symmetry.  There is pseudoptosis from lid lag bilaterally.   The pupils are equal round and reactive to light bilaterally. Fundoscopic exam reveals clear disc margins bilaterally. Extraocular muscles are intact and visual fields are full to confrontational testing. Speech is fluent and clear. Soft palate rises symmetrically and there is no tongue deviation. Hearing is intact to conversational tone. Tone: Tone is good throughout.  There is no rigidity. Sensation: Sensation is intact to light touch and pinprick throughout (facial, trunk, extremities). Vibration is decreased at the bilateral big toe. There is no extinction with double simultaneous stimulation. There is no sensory dermatomal level identified. Coordination:  The patient has no dysdiadichokinesia or dysmetria.  No asymmetries. Motor: Strength is 5/5 in the bilateral upper and lower extremities.  Shoulder shrug is equal bilaterally.  There is no pronator drift.  There are no fasciculations noted. DTR's: Deep tendon reflexes are 1/4 at the bilateral biceps, triceps, brachioradialis, left patella (did not test at the right patella because of pain) and absent at the bilateral  achilles.  Plantar responses are downgoing bilaterally. Gait and Station: The patient is able to ambulate without difficulty. She has some difficulty ambulating in a tandem fashion.  She sways in the Romberg position, but was able to stay in place with eyes closed.  MOVEMENT EXAM: Tremor:  There is minimal tremor of L hand, most obvious when trying to draw archimedes spirals.      LABS  Lab Results  Component Value Date   WBC 9.3 04/22/2014   HGB 13.3 04/22/2014   HCT 38.7 04/22/2014   MCV 88.6 04/22/2014   PLT 162 04/22/2014     Chemistry      Component Value Date/Time   NA 137 06/02/2014 1554   K 3.7 06/02/2014 1554   CL 106 06/02/2014 1554   CO2 23 06/02/2014 1554   BUN 12 06/02/2014 1554   CREATININE 0.9 06/02/2014 1554      Component Value Date/Time   CALCIUM 8.9 06/02/2014 1554   ALKPHOS 41 06/02/2014 1554   AST 36 06/02/2014 1554   ALT 40* 06/02/2014 1554  BILITOT 0.7 06/02/2014 1554     Lab Results  Component Value Date   TSH 1.500 04/18/2014      Assessment/Plan:   1.  Essential Tremor.  -Pt is on propranolol at reduced dose of 10 mg now but feels fine on this  -We talked about the topamax.  She would like to continue on the medication, 100 mg nightly.   There is no history of nephrolithiasis.  There is no history of acute angle closure glaucoma. 2.  Mild gait instability.  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  This seems overall very mild.  We talked about the diagnosis.  -w/u for reversible causes was negative  -We talked about safety associated with peripheral neuropathy. 3.  Acoustic neuroma.  -This is being followed by ENT and neurosurgery at [facility].  Very small  4.  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  -Long discussion with the patient today.  Greater than 50% of the 40 min visit was spent in counseling.  We will discontinue her aspirin and change her to Plavix.   Stroke signs and symptoms discussed.  If she has any of these in the future she is to call 911.  -Given that she has evidence on MRI of 2 different vascular territory acute/subacute infarctions, I think that she needs a TEE.  She has already had a TTE.  I will send her to cardiology for consideration of this as well as Holter monitor.  She has seen Dr. Ron Parker and will set up appt with him  -I do think that we should repeat MRI imaging in about 4 months. 5.  F/u after above completed.  Spoke with radiologist as above.  Pt also called her son while in room and asked me to leave him a v/m pertaining to above information and I did that.

## 2014-08-31 ENCOUNTER — Telehealth: Payer: Self-pay

## 2014-08-31 ENCOUNTER — Encounter: Payer: Self-pay | Admitting: Cardiology

## 2014-08-31 ENCOUNTER — Other Ambulatory Visit: Payer: Self-pay

## 2014-08-31 MED ORDER — PANTOPRAZOLE SODIUM 40 MG PO TBEC: 40.0000 mg | DELAYED_RELEASE_TABLET | Freq: Every day | ORAL | Status: DC

## 2014-08-31 NOTE — Telephone Encounter (Signed)
**Note De-Identified Saquan Furtick Obfuscation** Carlena Bjornstad, MD   Sent: Wed August 31, 2014 12:37 PM    To: Deliah Boston Markie Frith, LPN        Message     Please contact the patient and let her know that I am aware that she was assessed by Dr. Carles Collet of neurology recently. I received a copy of the note from Dr. Carles Collet. I know that the patient has an appointment to see me in May, 2016. Please explain to her that I feel that we should proceed with part of the evaluation recommended by Dr. Carles Collet before the visit with me. She needs to wear a 21 day event recorder. The diagnosis is cryptogenic stroke - rule out underlying atrial fibrillation. Please also schedule her for a TEE. Diagnosis is rule out cardiac source of embolus with strokes in 2 sites by CT scan. Since I have not explained the TEE procedure to the patient, please do so if you are comfortable. If not, please get help from one of the nurses who work with our TEE doctors.    LMTCB.

## 2014-08-31 NOTE — Progress Notes (Signed)
I received a note from neurology. The patient has 2 areas of abnormality on a head CT scan. Neurology would like to proceed with TEE and Holter monitoring. There is an appointment for me to see the patient in May, 2016. I will go ahead and order a 21 day event recorder to see if we can document atrial fibrillation. I will also arrange for transesophageal echocardiography.  Daryel November, MD

## 2014-09-01 ENCOUNTER — Other Ambulatory Visit: Payer: Self-pay

## 2014-09-01 DIAGNOSIS — I639 Cerebral infarction, unspecified: Secondary | ICD-10-CM

## 2014-09-01 NOTE — Telephone Encounter (Signed)
**Note De-Identified  Obfuscation** The pt is scheduled to have a TEE on 09/05/14 with Dr Stanford Breed at [facility]. She verbalized understanding and agrees with plan. She request that she wear the 21 day event monitor after she returns from trip on 4/25. [facility] is aware and will arrange with the pt.  The pt is aware.

## 2014-09-05 ENCOUNTER — Ambulatory Visit (HOSPITAL_COMMUNITY)
Admission: RE | Admit: 2014-09-05 | Discharge: 2014-09-05 | Disposition: A | Discharge status: Home / Self Care | Payer: Medicare Other | Source: Ambulatory Visit | Attending: Cardiology | Admitting: Cardiology

## 2014-09-05 ENCOUNTER — Encounter (HOSPITAL_COMMUNITY)
Admission: RE | Disposition: A | Discharge status: Home / Self Care | Payer: Self-pay | Source: Ambulatory Visit | Attending: Cardiology

## 2014-09-05 ENCOUNTER — Encounter (HOSPITAL_COMMUNITY): Payer: Self-pay

## 2014-09-05 DIAGNOSIS — Z8673 Personal history of transient ischemic attack (TIA), and cerebral infarction without residual deficits: Secondary | ICD-10-CM | POA: Insufficient documentation

## 2014-09-05 DIAGNOSIS — D333 Benign neoplasm of cranial nerves: Secondary | ICD-10-CM | POA: Diagnosis not present

## 2014-09-05 DIAGNOSIS — G250 Essential tremor: Secondary | ICD-10-CM | POA: Diagnosis not present

## 2014-09-05 DIAGNOSIS — I083 Combined rheumatic disorders of mitral, aortic and tricuspid valves: Secondary | ICD-10-CM | POA: Diagnosis not present

## 2014-09-05 DIAGNOSIS — R938 Abnormal findings on diagnostic imaging of other specified body structures: Secondary | ICD-10-CM | POA: Insufficient documentation

## 2014-09-05 DIAGNOSIS — I351 Nonrheumatic aortic (valve) insufficiency: Secondary | ICD-10-CM | POA: Diagnosis not present

## 2014-09-05 HISTORY — PX: TEE WITHOUT CARDIOVERSION: SHX5443

## 2014-09-05 HISTORY — DX: Pneumonia, unspecified organism: J18.9

## 2014-09-05 SURGERY — ECHOCARDIOGRAM, TRANSESOPHAGEAL
Anesthesia: Moderate Sedation

## 2014-09-05 MED ORDER — BUTAMBEN-TETRACAINE-BENZOCAINE 2-2-14 % EX AERO
INHALATION_SPRAY | CUTANEOUS | Status: DC | PRN
  Administered 2014-09-05: 2 via TOPICAL

## 2014-09-05 MED ORDER — MIDAZOLAM HCL 10 MG/2ML IJ SOLN
INTRAMUSCULAR | Status: DC | PRN
  Administered 2014-09-05 (×2): 2 mg via INTRAVENOUS

## 2014-09-05 MED ORDER — MIDAZOLAM HCL 5 MG/ML IJ SOLN
INTRAMUSCULAR | Status: AC
Start: 2014-09-05 — End: 2014-09-05
  Filled 2014-09-05: qty 2

## 2014-09-05 MED ORDER — SODIUM CHLORIDE 0.9 % IV SOLN: INTRAVENOUS | Status: DC

## 2014-09-05 MED ORDER — FENTANYL CITRATE 0.05 MG/ML IJ SOLN
INTRAMUSCULAR | Status: DC | PRN
  Administered 2014-09-05: 25 ug via INTRAVENOUS

## 2014-09-05 MED ORDER — FENTANYL CITRATE 0.05 MG/ML IJ SOLN
INTRAMUSCULAR | Status: AC
  Filled 2014-09-05: qty 2

## 2014-09-05 NOTE — Discharge Instructions (Signed)
Conscious Sedation, Adult, Care After °Refer to this sheet in the next few weeks. These instructions provide you with information on caring for yourself after your procedure. Your health care provider may also give you more specific instructions. Your treatment has been planned according to current medical practices, but problems sometimes occur. Call your health care provider if you have any problems or questions after your procedure. °WHAT TO EXPECT AFTER THE PROCEDURE  °After your procedure: °· You may feel sleepy, clumsy, and have poor balance for several hours. °· Vomiting may occur if you eat too soon after the procedure. °HOME CARE INSTRUCTIONS °· Do not participate in any activities where you could become injured for at least 24 hours. Do not: °¨ Drive. °¨ Swim. °¨ Ride a bicycle. °¨ Operate heavy machinery. °¨ Cook. °¨ Use power tools. °¨ Climb ladders. °¨ Work from a high place. °· Do not make important decisions or sign legal documents until you are improved. °· If you vomit, drink water, juice, or soup when you can drink without vomiting. Make sure you have little or no nausea before eating solid foods. °· Only take over-the-counter or prescription medicines for pain, discomfort, or fever as directed by your health care provider. °· Make sure you and your family fully understand everything about the medicines given to you, including what side effects may occur. °· You should not drink alcohol, take sleeping pills, or take medicines that cause drowsiness for at least 24 hours. °· If you smoke, do not smoke without supervision. °· If you are feeling better, you may resume normal activities 24 hours after you were sedated. °· Keep all appointments with your health care provider. °SEEK MEDICAL CARE IF: °· Your skin is pale or bluish in color. °· You continue to feel nauseous or vomit. °· Your pain is getting worse and is not helped by medicine. °· You have bleeding or swelling. °· You are still sleepy or  feeling clumsy after 24 hours. °SEEK IMMEDIATE MEDICAL CARE IF: °· You develop a rash. °· You have difficulty breathing. °· You develop any type of allergic problem. °· You have a fever. °MAKE SURE YOU: °· Understand these instructions. °· Will watch your condition. °· Will get help right away if you are not doing well or get worse. °Document Released: 03/03/2013 Document Reviewed: 03/03/2013 °ExitCare® Patient Information ©2015 [employer]. This information is not intended to replace advice given to you by your health care provider. Make sure you discuss any questions you have with your health care provider. °  °

## 2014-09-05 NOTE — H&P (Signed)
Carmen Duncan  08/30/2014 9:45 AM  Office Visit  MRN:  [id number]   Description: Female DOB: 1940-12-13  Provider: Eustace Quail Tat, DO  Department: [facility]       Diagnoses     Abnormal MRI - Primary    ICD-9-CM: 793.99 ICD-10-CM: R93.8    Acoustic neuroma     ICD-9-CM: 225.1 ICD-10-CM: D33.3    Stroke     ICD-9-CM: 434.91 ICD-10-CM: I63.9    Cerebral infarction, watershed distribution, bilateral, acute     ICD-9-CM: 434.91 ICD-10-CM: I63.8       Reason for Visit     Follow-up    Reason for Visit History        Current Vitals  Most recent update: 08/30/2014 9:30 AM by Annamaria Helling, CMA    BP Pulse Ht Wt BMI    146/80 mmHg 80 5\' 1"  (1.549 m) 132 lb (59.875 kg) 24.95 kg/m2      Progress Notes      Eustace Quail Tat, DO at 08/30/2014 9:31 AM     Status: Signed       Expand All Collapse All   Subjective:   Carmen Duncan was seen in consultation in the movement disorder clinic at the request of [name], MD. The evaluation is for tremor.  The patient is a 74 y.o. R handed female with a long hx of tremor. The patient states that the tremor is in the L hand. She notes it with activation. If pouring something from a saucepan, it will start to tremor. It seems to be worse in specific positions. She has no trouble with putting on makeup or eating as this only affects her nondominant hand. She has no trouble cutting food. She has no vocal tremor, head tremor or RUE tremor. She has no leg tremor. Sometimes she feels like she is "walking like a drunk" because she is not stable. She does not feel comfortable near the edge of a pier b/c she feels like she would fall off the edge. She generally does not fall. She did fall Jul 16, 2011 because she missed the last 3 steps at her home when she was coming down in a hurry. She bruised her entire R leg and it is still very sore.   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 I do not have those records. She saw Dr. [name]. She is on propranolol and she has been on it 7-8 years. She is on 40 mg twice per day. It does help. She states that she was told that her propranolol could not be increased.  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 She sees a Publishing rights manager at [facility] and was told it was nothing to worry about. She has it monitored q 3 years. Her last MRI brain was done in [place]. Because of this, she will have intermittent numbness of the L face.   There was a fam hx of tremor in her sister and it is starting in her son, who is 45 years old.  Current/Previously tried tremor medications: propranolol, primidone  Current medications that may exacerbate tremor: N/a  12/03/12: She was started on primidone last visit. She is only taking 1/2 of a 50 mg tablet at night. She didn't go up to a full tablet because she did not like the way that it made her feel. She wants to d/c the  medication. She is on propranolol but the dose cannot be increased because her pulse is already on the low end of normal. She is having some intermittent paresthesias in her fingertips, right more than left.  03/12/13: Topamax was added last visit, which has helped but has decreased appetite. She tremors much less now but notices it when stressed, fatigued or under pressure. She c/o constipation with the topamax. She has taste aversion with the medication. She has some paresthesias with the medication. She remains on propranolol but is off of the primidone. She thinks that the SE are worth continuing to take the medication. She has had worsening of vision but thinks that she had that prior to the topamax and has an eye appt Nov 4.  She worked on last visit. B12 was reported. Serum protein electrophoresis did not reveal M spike. RPR was negative. Urinary protein electrophoresis showed some free  kappa light chains but there was no monoclonal protein, no M spike. RPR was negative.  07/28/13 update: The pt was on topamax 200 mg daily. She was unable to tolerate bid dosing of the medication. She ended up going back to 100 mg at night and it makes her sleepy at night when she takes the medication but no hang over effect. She still has some intermittent paresthesias. 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 She does c/o dull fronal headaches, daily for the last month. She uses sinus rinse and it helps. States that tremor has been stable.   02/03/14 update: The patient has a history of essential tremor, and is currently on propranolol 40 mg once a day as well as Topamax 100 mg daily. Unless she is very tired or upset, she does well in regards to tremor. She received notes from [name] at [facility] since last visit. He just said that her schwannoma was stable in size and she just needs a repeat MRI for 10 years that they will plan on doing. She did have a repeat urinary protein electrophoresis since our last visit that was unremarkable. I had the opportunity to review notes from her primary care physician from 01/20/2014. There were 2 brief episodes that the patient describes in which she was walking and her legs felt weak. She felt near syncopal. She was not dizzy but just stated that "I couldn't have moved if I had to." It only lasted for a few minutes. She had already decreased the propranolol on her own when these episodes happened. She did that primarily because she would forget to take it bid but also because of fatigue.   08/04/14 update: The patient returns today for follow-up. Much has happened since her last visit. Medical records were reviewed. She has a history of essential tremor and was on propranolol 40 mg, but that has since been  reduced to what medical records said was 10 mg daily but pt states is 5 mg daily because of other medical issues. She remains on Topamax 100 mg daily. In November, 2015, she went to her primary care physician complaining about malaise and she had a nuclear stress test that was a low risk study with a left ventricular ejection fraction of 83%. She saw cardiology not long thereafter and ended up having a chest x-ray demonstrating bibasilar infiltrates. A few days later she developed a fever and was treated for community-acquired pneumonia. 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 During that hospitalization, her propranolol was decreased from 40 mg daily to 10 mg daily because of hypotension. She became more weak during that hospitalization and was discharged to a subacute rehabilitation facility and ultimately left that rehabilitation facility and was discharged home on December 23. She does think that tremor may have increased slightly.  08/30/14 update: The patient is following up today at the request of her ophthalmologist. Pt states that she just went for a "normal checkup" because her vision had changed a little and "I needed new frames." She states that her eye doctor thought that there was a stroke behind the left eye and that led to an MRI of the brain and MRA of the brain on 08/23/2014. 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 There was an enhancing lesion in the right parietal region as well. I talked on the phone with the radiologist about this lesion and he felt that this was likely a subacute vascular insult and not a metastatic lesion. 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 She had a carotid ultrasound on 07/14/2014 demonstrating 1-39% stenosis bilaterally. She did an echocardiogram  last year when she was in the hospital on 04/20/2014 demonstrating an ejection fraction of 65-70%, moderate diastolic dysfunction and moderate pulmonary HTN  Outside reports reviewed: historical medical records.  Allergies  Allergen Reactions  . Latex Itching, Rash and Other (See Comments)    Red angry skin from latex tape  . Sulfonamide Derivatives Rash    hives  . Zocor [Simvastatin] Nausea Only    Current Outpatient Prescriptions on File Prior to Visit  Medication Sig Dispense Refill  . acetaminophen (TYLENOL) 325 MG tablet Take 650 mg by mouth every 6 (six) hours as needed for headache.    [name] aspirin 81 MG tablet Take 81 mg by mouth daily.     [name] atorvastatin (LIPITOR) 20 MG tablet Take 1 tablet (20 mg total) by mouth daily. 30 tablet 5  . cetirizine (ZYRTEC) 10 MG tablet Take 10 mg by mouth daily. Seasonally: As needed    . clidinium-chlordiazePOXIDE (LIBRAX) 5-2.5 MG per capsule Take 1 capsule by mouth 2 (two) times daily as needed. 60 capsule 3  . furosemide (LASIX) 20 MG tablet Take 1 tablet (20 mg total) by mouth daily. 30 tablet 5  . lisinopril (PRINIVIL,ZESTRIL) 20 MG tablet Take 1 tablet (20 mg total) by mouth daily. 30 tablet 3  . omeprazole (PRILOSEC OTC) 20 MG tablet Take 20 mg by mouth daily.     . propranolol (INDERAL) 10 MG tablet Take 1 tablet (10 mg total) by mouth at bedtime. 30 tablet 0  . topiramate (TOPAMAX) 50 MG tablet TAKE TWO TABLETS BY MOUTH AT BEDTIME. 60 tablet 5   No current facility-administered medications on file prior to visit.    Past Medical History  Diagnosis Date  . Arthritis   . Asthma   . Hypertension   . GERD (gastroesophageal reflux disease)   . Hyperlipidemia   . Diverticulosis   . IBS (irritable bowel syndrome)   . HOH (hard of hearing) left ear  . Acoustic neuroma     Past Surgical History  Procedure Laterality  Date  . Cholecystectomy  2011  . Abdominal hysterectomy  1980  . Tonsillectomy  1945  . Biopsy breast    .  Eyes  2007    cararacts    History   Social History  . Marital Status: Widowed    Spouse Name: N/A  . Number of Children: N/A  . Years of Education: N/A   Occupational History  . Not on file.   Social History Main Topics  . Smoking status: Never Smoker   . Smokeless tobacco: Never Used  . Alcohol Use: Yes     Comment: wine about 3 times a week  . Drug Use: No  . Sexual Activity:    Partners: Female   Other Topics Concern  . Not on file   Social History Narrative    Family Status  Relation Status Death Age  . Mother Deceased     acute leukemia  . Father Deceased     MI, CVA  . Sister Alive     tremor  . Child Alive     son, mild tremor    Review of Systems A complete 10 system ROS was obtained and was negative apart from what is mentioned.  Objective:   VITALS:  Filed Vitals:   08/30/14 0927  BP: 146/80  Pulse: 80  Height: 5\' 1"  (1.549 m)  Weight: 132 lb (59.875 kg)   Wt Readings from Last 3 Encounters:  08/30/14 132 lb (59.875 kg)  08/26/14 131 lb (59.421 kg)  08/08/14 132 lb (59.875 kg)   Gen: Appears stated age and in NAD. HEENT: Normocephalic, atraumatic. The mucous membranes are moist. The superficial temporal arteries are without ropiness or tenderness. Cardiovascular: Regular rate and rhythm. Lungs: Clear to auscultation bilaterally. Neck: There are no carotid bruits noted bilaterally.  NEUROLOGICAL:  Orientation: The patient is alert and oriented x 3. Recent and remote memory are intact. Attention span and concentration are normal. Able to name objects and repeat without trouble. Fund of knowledge is appropriate Cranial nerves: There is good facial symmetry. There is pseudoptosis from lid  lag bilaterally. The pupils are equal round and reactive to light bilaterally. Fundoscopic exam reveals clear disc margins bilaterally. Extraocular muscles are intact and visual fields are full to confrontational testing. Speech is fluent and clear. Soft palate rises symmetrically and there is no tongue deviation. Hearing is intact to conversational tone. Tone: Tone is good throughout. There is no rigidity. Sensation: Sensation is intact to light touch and pinprick throughout (facial, trunk, extremities). Vibration is decreased at the bilateral big toe. There is no extinction with double simultaneous stimulation. There is no sensory dermatomal level identified. Coordination: The patient has no dysdiadichokinesia or dysmetria. No asymmetries. Motor: Strength is 5/5 in the bilateral upper and lower extremities. Shoulder shrug is equal bilaterally. There is no pronator drift. There are no fasciculations noted. DTR's: Deep tendon reflexes are 1/4 at the bilateral biceps, triceps, brachioradialis, left patella (did not test at the right patella because of pain) and absent at the bilateral achilles. Plantar responses are downgoing bilaterally. Gait and Station: The patient is able to ambulate without difficulty. She has some difficulty ambulating in a tandem fashion. She sways in the Romberg position, but was able to stay in place with eyes closed.  MOVEMENT EXAM: Tremor: There is minimal tremor of L hand, most obvious when trying to draw archimedes spirals.   LABS Lab Results  Component Value Date   WBC 9.3 04/22/2014   HGB 13.3 04/22/2014   HCT 38.7 04/22/2014   MCV 88.6 04/22/2014   PLT 162 04/22/2014    Chemistry    Labs (Brief)  Component Value Date/Time   NA 137 06/02/2014 1554   K 3.7 06/02/2014 1554   CL 106 06/02/2014 1554   CO2 23 06/02/2014 1554   BUN 12 06/02/2014 1554   CREATININE 0.9 06/02/2014 1554       Labs (Brief)       Component Value Date/Time   CALCIUM 8.9 06/02/2014 1554   ALKPHOS 41 06/02/2014 1554   AST 36 06/02/2014 1554   ALT 40* 06/02/2014 1554   BILITOT 0.7 06/02/2014 1554       Lab Results  Component Value Date   TSH 1.500 04/18/2014     Assessment/Plan:   1. Essential Tremor. -Pt is on propranolol at reduced dose of 10 mg now but feels fine on this -We talked about the topamax. She would like to continue on the medication, 100 mg nightly. There is no history of nephrolithiasis. There is no history of acute angle closure glaucoma. 2. Mild gait instability. 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 This seems overall very mild. We talked about the diagnosis. -w/u for reversible causes was negative -We talked about safety associated with peripheral neuropathy. 3. Acoustic neuroma. -This is being followed by ENT and neurosurgery at [facility]. Very small  4. 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 -Long discussion with the patient today. Greater than 50% of the 40 min visit was spent in counseling. We will discontinue her aspirin and change her to Plavix. Stroke signs and symptoms discussed. If she has any of these in the future she is to call 911. -Given that she has evidence on MRI of 2 different vascular territory acute/subacute infarctions, I think that she needs a TEE. She has already had a TTE. I will send her to cardiology for consideration of this as well as Holter monitor. She has seen Dr. Ron Parker and will set up appt with him -I do think that we should repeat MRI imaging in about 4 months. 5. F/u after above completed. Spoke with radiologist as above. Pt also called her son while in room and asked me to leave him a v/m  pertaining to above information and I did that.        For TEE to R/O SOE; no changes Kirk Ruths

## 2014-09-05 NOTE — Progress Notes (Signed)
  Echocardiogram Echocardiogram Transesophageal has been performed.  Diamond Nickel 09/05/2014, 8:27 AM

## 2014-09-05 NOTE — CV Procedure (Signed)
See  Full TEE  Report in [facility]; normal LV function; mild AI, MR and TR; negative saline microcavitation study. Carmen Duncan

## 2014-09-05 NOTE — Interval H&P Note (Signed)
History and Physical Interval Note:  09/05/2014 8:05 AM  Carmen Duncan  has presented today for surgery, with the diagnosis of STROKE  The various methods of treatment have been discussed with the patient and family. After consideration of risks, benefits and other options for treatment, the patient has consented to  Procedure(s): TRANSESOPHAGEAL ECHOCARDIOGRAM (TEE) (N/A) as a surgical intervention .  The patient's history has been reviewed, patient examined, no change in status, stable for surgery.  I have reviewed the patient's chart and labs.  Questions were answered to the patient's satisfaction.     Kirk Ruths

## 2014-09-06 ENCOUNTER — Encounter (HOSPITAL_COMMUNITY): Payer: Self-pay | Admitting: Cardiology

## 2014-09-23 ENCOUNTER — Other Ambulatory Visit (INDEPENDENT_AMBULATORY_CARE_PROVIDER_SITE_OTHER): Payer: Medicare Other

## 2014-09-23 ENCOUNTER — Ambulatory Visit (INDEPENDENT_AMBULATORY_CARE_PROVIDER_SITE_OTHER): Payer: Medicare Other | Admitting: Pulmonary Disease

## 2014-09-23 ENCOUNTER — Encounter: Payer: Self-pay | Admitting: Pulmonary Disease

## 2014-09-23 VITALS — BP 160/98 | HR 87 | Ht 61.0 in | Wt 132.4 lb

## 2014-09-23 DIAGNOSIS — I10 Essential (primary) hypertension: Secondary | ICD-10-CM | POA: Diagnosis not present

## 2014-09-23 DIAGNOSIS — J8489 Other specified interstitial pulmonary diseases: Secondary | ICD-10-CM | POA: Diagnosis not present

## 2014-09-23 DIAGNOSIS — I5032 Chronic diastolic (congestive) heart failure: Secondary | ICD-10-CM

## 2014-09-23 LAB — SEDIMENTATION RATE: Sed Rate: 23 mm/hr — ABNORMAL HIGH (ref 0–22)

## 2014-09-23 MED ORDER — LOSARTAN POTASSIUM 100 MG PO TABS: 100.0000 mg | ORAL_TABLET | Freq: Every day | ORAL | Status: DC

## 2014-09-23 NOTE — Progress Notes (Signed)
   Subjective:    Patient ID: Carmen Duncan, female    DOB: [birth year], 74 y.o.   MRN: [id number]  HPI  74yo never smoker with asthma, HTNfor follow-up of interstitial lung disease  Her son [name] works for [employer] 03/2014 Seen for pulmonary consult during hospitalization for CAP for non-resolving infiltrates.   She presented with dyspnea on exertion for several months with occasional dry cough, acute fevers with sputum production for the 2 weeks, treated with Levaquin 10 days as an outpatient by PCP, neg cardiac evaluation by Dr. Ron Parker , history of Raynaud phenomenon exam findings of bilateral coarse crackles at bases, no pedal edema or JVD & labs showing hyponatremia (chronic), absence of leukocytosis.  She was treated with IV steroids and discharged on a slow prednisone taper  Significant tests/ events  CXR reviewed- bibasal infiltrates seem to have improved from 04/06/14, these are new when compared to old chest x-ray from 2011. 03/2014 HRCT scan -showed peripheral patchy consolidation bilaterally-infection versus COP  Swallow eval neg for aspiration  03/2014 echo >> grade 2 diastolic dysfunction with fluid overload and elevated BNP   09/23/2014  Chief Complaint  Patient presents with  . Follow-up    Prednisone complete. breathing doing well.  taking some time to get over pneumonia.  Overall feeling okay.  She thinks she may be allergic to some of her medications, she has been itchy   Stopped prednisone  -to prednisone for about 4 months and finally tapered by end of [employer] 2016 She continues to have blurred vision Leg swelling appears to have resolved-dyspnea is improved Denies chest pain, orthopnea, edema or hemoptysis She reports a dry cough, medication review shows lisinopril  I have reviewed all relevant imaging, labs & test data  Past Medical History  Diagnosis Date  . Arthritis   . Asthma   . Hypertension   . GERD (gastroesophageal reflux disease)   .  Hyperlipidemia   . Diverticulosis   . IBS (irritable bowel syndrome)   . HOH (hard of hearing) left ear  . Acoustic neuroma   . Pneumonia     Dec. 2015     Review of Systems neg for any significant sore throat, dysphagia, itching, sneezing, nasal congestion or excess/ purulent secretions, fever, chills, sweats, unintended wt loss, pleuritic or exertional cp, hempoptysis, orthopnea pnd or change in chronic leg swelling. Also denies presyncope, palpitations, heartburn, abdominal pain, nausea, vomiting, diarrhea or change in bowel or urinary habits, dysuria,hematuria, rash, arthralgias, visual complaints, headache, numbness weakness or ataxia.     Objective:   Physical Exam  Gen. Pleasant, well-nourished, in no distress, normal affect ENT - no lesions, no post nasal drip Neck: No JVD, no thyromegaly, no carotid bruits Lungs: no use of accessory muscles, no dullness to percussion, bibasal rales , no rhonchi  Cardiovascular: Rhythm regular, heart sounds  normal, no murmurs or gallops, no peripheral edema Abdomen: soft and non-tender, no hepatosplenomegaly, BS normal. Musculoskeletal: No deformities, no cyanosis or clubbing Neuro:  alert, non focal        Assessment & Plan:

## 2014-09-23 NOTE — Assessment & Plan Note (Addendum)
  She continues to have bibasal crackles- - ? IPF vs COP CT scan of lungs to compare to nov 2015 Blood work today - ANA, ESR, CCP, hypersens panel

## 2014-09-23 NOTE — Assessment & Plan Note (Signed)
Continue current dose of Lasix

## 2014-09-23 NOTE — Patient Instructions (Signed)
STOP lisinopril START Losartan 100 mg daily instead You may have scarring in your lung - CT scan of lungs to compare to nov 2015 Blood work today

## 2014-09-23 NOTE — Assessment & Plan Note (Signed)
STOP lisinopril START Losartan 100 mg daily instead

## 2014-09-26 ENCOUNTER — Telehealth: Payer: Self-pay | Admitting: Family Medicine

## 2014-09-26 LAB — ANA: Anti Nuclear Antibody(ANA): POSITIVE — AB

## 2014-09-26 LAB — ANTI-NUCLEAR AB-TITER (ANA TITER): ANA TITER 1: NEGATIVE (ref <1:40)

## 2014-09-26 NOTE — Telephone Encounter (Signed)
Patient lost the handicapped paper done previously by Dr. Elease Hashimoto.  She would like to know if another one can be completed and she come to pick it up today?

## 2014-09-26 NOTE — Telephone Encounter (Signed)
Left detailed message on Vm that Paper is ready for pick up

## 2014-09-27 LAB — CYCLIC CITRUL PEPTIDE ANTIBODY, IGG: Cyclic Citrullin Peptide Ab: 3.6 U/mL (ref 0.0–5.0)

## 2014-09-28 ENCOUNTER — Ambulatory Visit (INDEPENDENT_AMBULATORY_CARE_PROVIDER_SITE_OTHER)
Admission: RE | Admit: 2014-09-28 | Discharge: 2014-09-28 | Disposition: A | Discharge status: Home / Self Care | Payer: Medicare Other | Source: Ambulatory Visit | Attending: Pulmonary Disease | Admitting: Pulmonary Disease

## 2014-09-28 DIAGNOSIS — J8489 Other specified interstitial pulmonary diseases: Secondary | ICD-10-CM | POA: Diagnosis not present

## 2014-09-29 LAB — HYPERSENSITIVITY PNUEMONITIS PROFILE

## 2014-09-30 ENCOUNTER — Telehealth: Payer: Self-pay | Admitting: Pulmonary Disease

## 2014-09-30 DIAGNOSIS — R0609 Other forms of dyspnea: Principal | ICD-10-CM

## 2014-09-30 DIAGNOSIS — R0600 Dyspnea, unspecified: Secondary | ICD-10-CM

## 2014-09-30 NOTE — Telephone Encounter (Signed)
Result Note     Blood work does not show inflammation   Result Note     Spoke to son [name] about CT findings     Pl arrange PFTs in [facility] office   --  I spoke with patient about results and she verbalized understanding and had no questions. PFT scheduled for Monday at 11. Nothing further needed

## 2014-10-03 ENCOUNTER — Ambulatory Visit (INDEPENDENT_AMBULATORY_CARE_PROVIDER_SITE_OTHER): Payer: Medicare Other | Admitting: Pulmonary Disease

## 2014-10-03 DIAGNOSIS — R0609 Other forms of dyspnea: Secondary | ICD-10-CM

## 2014-10-03 DIAGNOSIS — R0600 Dyspnea, unspecified: Secondary | ICD-10-CM

## 2014-10-03 LAB — PULMONARY FUNCTION TEST
DL/VA % PRED: 87 %
DL/VA: 3.86 ml/min/mmHg/L
DLCO unc % pred: 50 %
DLCO unc: 10.23 ml/min/mmHg
FEF 25-75 Post: 1.32 L/sec
FEF 25-75 Pre: 1.04 L/sec
FEF2575-%CHANGE-POST: 27 %
FEF2575-%PRED-PRE: 72 %
FEF2575-%Pred-Post: 92 %
FEV1-%CHANGE-POST: 6 %
FEV1-%Pred-Post: 68 %
FEV1-%Pred-Pre: 64 %
FEV1-Post: 1.23 L
FEV1-Pre: 1.16 L
FEV1FVC-%Change-Post: 0 %
FEV1FVC-%Pred-Pre: 105 %
FEV6-%Change-Post: 5 %
FEV6-%Pred-Post: 67 %
FEV6-%Pred-Pre: 63 %
FEV6-Post: 1.54 L
FEV6-Pre: 1.46 L
FEV6FVC-%PRED-PRE: 105 %
FEV6FVC-%Pred-Post: 105 %
FVC-%Change-Post: 5 %
FVC-%Pred-Post: 64 %
FVC-%Pred-Pre: 60 %
FVC-Post: 1.54 L
FVC-Pre: 1.46 L
POST FEV1/FVC RATIO: 80 %
Post FEV6/FVC ratio: 100 %
Pre FEV1/FVC ratio: 79 %
Pre FEV6/FVC Ratio: 100 %
RV % pred: 82 %
RV: 1.78 L
TLC % PRED: 69 %
TLC: 3.2 L

## 2014-10-03 NOTE — Progress Notes (Signed)
PFT done today. 

## 2014-10-04 ENCOUNTER — Encounter (INDEPENDENT_AMBULATORY_CARE_PROVIDER_SITE_OTHER): Payer: Medicare Other

## 2014-10-04 ENCOUNTER — Encounter: Payer: Self-pay | Admitting: *Deleted

## 2014-10-04 DIAGNOSIS — I4891 Unspecified atrial fibrillation: Secondary | ICD-10-CM | POA: Diagnosis not present

## 2014-10-04 DIAGNOSIS — I639 Cerebral infarction, unspecified: Secondary | ICD-10-CM

## 2014-10-04 NOTE — Progress Notes (Signed)
Patient ID: Carmen Duncan, female   DOB: 1940-08-09, 74 y.o.   MRN: [id number]  [employer] verite 21 day cardiac event monitor applied to patient.

## 2014-10-07 ENCOUNTER — Encounter: Payer: Self-pay | Admitting: Cardiology

## 2014-10-07 DIAGNOSIS — I739 Peripheral vascular disease, unspecified: Secondary | ICD-10-CM

## 2014-10-07 DIAGNOSIS — I272 Pulmonary hypertension, unspecified: Secondary | ICD-10-CM | POA: Insufficient documentation

## 2014-10-07 DIAGNOSIS — R9430 Abnormal result of cardiovascular function study, unspecified: Secondary | ICD-10-CM | POA: Insufficient documentation

## 2014-10-07 DIAGNOSIS — I779 Disorder of arteries and arterioles, unspecified: Secondary | ICD-10-CM | POA: Insufficient documentation

## 2014-10-10 ENCOUNTER — Ambulatory Visit (INDEPENDENT_AMBULATORY_CARE_PROVIDER_SITE_OTHER): Payer: Medicare Other | Admitting: Cardiology

## 2014-10-10 ENCOUNTER — Encounter: Payer: Self-pay | Admitting: Cardiology

## 2014-10-10 VITALS — BP 140/70 | HR 64 | Ht 61.0 in | Wt 132.0 lb

## 2014-10-10 DIAGNOSIS — I5032 Chronic diastolic (congestive) heart failure: Secondary | ICD-10-CM

## 2014-10-10 DIAGNOSIS — I639 Cerebral infarction, unspecified: Secondary | ICD-10-CM | POA: Diagnosis not present

## 2014-10-10 DIAGNOSIS — I739 Peripheral vascular disease, unspecified: Secondary | ICD-10-CM

## 2014-10-10 DIAGNOSIS — I779 Disorder of arteries and arterioles, unspecified: Secondary | ICD-10-CM

## 2014-10-10 DIAGNOSIS — I10 Essential (primary) hypertension: Secondary | ICD-10-CM | POA: Diagnosis not present

## 2014-10-10 NOTE — Patient Instructions (Addendum)
**Note De-Identified  Obfuscation** Medication Instructions:  Same  Labwork: None  Testing/Procedures: None  Follow-Up: Your physician wants you to follow-up in: 6 months. You will receive a reminder letter in the mail two months in advance. If you don't receive a letter, please call our office to schedule the follow-up appointment.  Please wear your heart monitor as much as you can tolerate without removing.

## 2014-10-10 NOTE — Assessment & Plan Note (Signed)
Her volume status is stable. No change in therapy. 

## 2014-10-10 NOTE — Assessment & Plan Note (Signed)
The patient has very mild bilateral carotid disease. This is not felt to be the basis of her cryptogenic stroke.

## 2014-10-10 NOTE — Assessment & Plan Note (Signed)
So far the workup has shown no clot by TEE. There was no PFO. There was no shunt by echo contrast. Event recorder is in place. I have urged her to wear it all of the time. On a limited analysis so far, there is no proven atrial fibrillation.

## 2014-10-10 NOTE — Assessment & Plan Note (Signed)
Blood pressures controlled. No change in therapy. 

## 2014-10-10 NOTE — Progress Notes (Signed)
Cardiology Office Note   Date:  10/10/2014   ID:  Carmen, Duncan 1940-07-10, MRN [id number]  PCP:  Carmen Post, MD  Cardiologist:  Dola Argyle, MD   Chief Complaint  Patient presents with  . Appointment    Follow-up shortness of breath      History of Present Illness: Carmen Duncan is a 74 y.o. female who presents today to follow-up history of shortness of breath. She is also seen to help with her overall cardiac evaluation. Earlier this year, the patient had a head CT showing 2 areas of abnormality. She is carrying the diagnosis of cryptogenic stroke. Neurology requested both TEE and Holter monitoring. I arranged for the studies before seeing her in the office. I had previously seen her in November, 2015.  Patient had carotid Doppler February, 2016. There was mild bilateral disease. She did have a TEE. It was normal. An event recorder was placed Oct 04, 2014. We are trying to assess her Foley to be sure that she does not have paroxysmal atrial fibrillation. She does have the event recorder. However she admits to me today that she is not wearing it all the time. I explained the rationale to her carefully and urged her to wear all of the time.    Past Medical History  Diagnosis Date  . Arthritis   . Asthma   . Hypertension   . GERD (gastroesophageal reflux disease)   . Hyperlipidemia   . Diverticulosis   . IBS (irritable bowel syndrome)   . HOH (hard of hearing) left ear  . Acoustic neuroma   . Pneumonia     Dec. 2015  . Ejection fraction     Past Surgical History  Procedure Laterality Date  . Cholecystectomy  2011  . Abdominal hysterectomy  1980  . Tonsillectomy  1945  . Biopsy breast    . Eyes  2007    cararacts  . Tee without cardioversion N/A 09/05/2014    Procedure: TRANSESOPHAGEAL ECHOCARDIOGRAM (TEE);  Surgeon: Lelon Perla, MD;  Location: [facility] ENDOSCOPY;  Service: Cardiovascular;  Laterality: N/A;    Patient Active Problem List   Diagnosis Date  Noted  . Bilateral carotid artery disease 10/07/2014  . Pulmonary hypertension 10/07/2014  . Ejection fraction   . Cryptogenic stroke 10/04/2014  . Oral candidiasis 04/28/2014  . Chronic diastolic congestive heart failure 04/28/2014  . BOOP (bronchiolitis obliterans with organizing pneumonia) 04/21/2014  . Hyponatremia 04/19/2014  . Thrombocytopenia 04/19/2014  . Malnutrition of moderate degree 04/19/2014  . Hypertension 04/18/2014  . Hyperlipidemia 04/18/2014  . Hypovolemia due to dehydration 04/18/2014  . Dyspnea on exertion 04/04/2014  . Chest rales 04/04/2014  . Abnormal nuclear stress test 04/04/2014  . HOH (hard of hearing)   . Raynaud's phenomenon 03/21/2014  . Tremor, essential 03/12/2013  . Unspecified hereditary and idiopathic peripheral neuropathy 08/04/2012  . Squamous cell skin cancer 08/19/2011  . 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 HYPERLIPIDEMIA 04/03/2010  . Essential hypertension 04/03/2010  . GERD 04/03/2010  . IBS 04/03/2010  . DEEP VENOUS THROMBOPHLEBITIS, HX OF 04/03/2010  . DIVERTICULITIS, HX OF 04/03/2010      Current Outpatient Prescriptions  Medication Sig Dispense Refill  . acetaminophen (TYLENOL) 325 MG tablet Take 650 mg by mouth every 6 (six) hours as needed for headache.    [name] atorvastatin (LIPITOR) 20 MG tablet Take 1 tablet (20 mg total) by mouth daily. 30 tablet 5  . cetirizine (ZYRTEC) 10 MG tablet Take 10 mg by mouth  daily. Seasonally:  As needed    . clidinium-chlordiazePOXIDE (LIBRAX) 5-2.5 MG per capsule Take 1 capsule by mouth 2 (two) times daily as needed. 60 capsule 3  . clopidogrel (PLAVIX) 75 MG tablet Take 1 tablet (75 mg total) by mouth daily. 30 tablet 5  . furosemide (LASIX) 20 MG tablet Take 1 tablet (20 mg total) by mouth daily. 30 tablet 5  . losartan (COZAAR) 100 MG tablet Take 1 tablet (100 mg total) by mouth daily. 30 tablet 11  . pantoprazole (PROTONIX) 40 MG tablet Take 1 tablet (40 mg total) by mouth daily. 30 tablet 2  .  propranolol (INDERAL) 10 MG tablet Take 1 tablet (10 mg total) by mouth at bedtime. 30 tablet 0  . topiramate (TOPAMAX) 50 MG tablet TAKE TWO TABLETS BY MOUTH AT BEDTIME. 60 tablet 5   No current facility-administered medications for this visit.    Allergies:   Other; Latex; Sulfonamide derivatives; and Zocor    Social History:  The patient  reports that she has never smoked. She has never used smokeless tobacco. She reports that she drinks alcohol. She reports that she does not use illicit drugs.   Family History:  The patient's family history includes Heart disease (age of onset: 71) in her father; Hyperlipidemia in her father and mother; Hypertension in her mother; Stroke in her father.    ROS:  Please see the history of present illness.     Patient denies fever, chills, headache, sweats, rash, change in vision, change in hearing, chest pain, cough, nausea or vomiting, urinary symptoms. All other systems are reviewed and are negative.   PHYSICAL EXAM: VS:  BP 140/70 mmHg  Pulse 64  Ht 5\' 1"  (1.549 m)  Wt 132 lb (59.875 kg)  BMI 24.95 kg/m2 , Patient is oriented to person time and place. Affect is normal. Head is atraumatic. Sclera and conjunctiva are normal. There is no jugular venous distention. Lungs are clear. Respiratory effort is nonlabored. Cardiac exam reveals S1 and S2. Abdomen is soft. There is no peripheral edema. There are no musculoskeletal deformities. There are no skin rashes.   EKG:   EKG is not done today.  I have one strip from her event recorder. It shows some sinus tachycardia. After a short monitoring period, we have not yet seen any atrial fibrillation.   Recent Labs: 04/18/2014: TSH 1.500 04/19/2014: Magnesium 1.9 04/22/2014: Hemoglobin 13.3; Platelets 162 04/28/2014: Pro B Natriuretic peptide (BNP) 280.0* 06/02/2014: ALT 40*; BUN 12; Creatinine 0.9; Potassium 3.7; Sodium 137    Lipid Panel    Component Value Date/Time   CHOL 139 01/20/2014 1348   TRIG  116.0 01/20/2014 1348   HDL 50.30 01/20/2014 1348   CHOLHDL 3 01/20/2014 1348   VLDL 23.2 01/20/2014 1348   LDLCALC 66 01/20/2014 1348   LDLDIRECT 194.6 10/02/2010 1016      Wt Readings from Last 3 Encounters:  10/10/14 132 lb (59.875 kg)  09/23/14 132 lb 6.4 oz (60.056 kg)  08/30/14 132 lb (59.875 kg)      Current medicines are reviewed  The patient understands her medications.     ASSESSMENT AND PLAN:

## 2014-10-19 ENCOUNTER — Telehealth: Payer: Self-pay | Admitting: *Deleted

## 2014-10-19 NOTE — Telephone Encounter (Signed)
LMVM- We were contacted by [employer].  They have not received transmissions on your heart monitor for approx. 5 days.  Please call them as soon as possible at [phone].

## 2014-10-31 ENCOUNTER — Ambulatory Visit (INDEPENDENT_AMBULATORY_CARE_PROVIDER_SITE_OTHER): Payer: Medicare Other | Admitting: Family Medicine

## 2014-10-31 ENCOUNTER — Encounter: Payer: Self-pay | Admitting: Family Medicine

## 2014-10-31 VITALS — BP 130/70 | HR 80 | Temp 98.0°F | Wt 129.0 lb

## 2014-10-31 DIAGNOSIS — I10 Essential (primary) hypertension: Secondary | ICD-10-CM | POA: Diagnosis not present

## 2014-10-31 DIAGNOSIS — E785 Hyperlipidemia, unspecified: Secondary | ICD-10-CM | POA: Diagnosis not present

## 2014-10-31 DIAGNOSIS — I5032 Chronic diastolic (congestive) heart failure: Secondary | ICD-10-CM | POA: Diagnosis not present

## 2014-10-31 DIAGNOSIS — G250 Essential tremor: Secondary | ICD-10-CM | POA: Diagnosis not present

## 2014-10-31 LAB — HEPATIC FUNCTION PANEL
ALK PHOS: 53 U/L (ref 39–117)
ALT: 10 U/L (ref 0–35)
AST: 17 U/L (ref 0–37)
Albumin: 3.5 g/dL (ref 3.5–5.2)
Bilirubin, Direct: 0.1 mg/dL (ref 0.0–0.3)
Total Bilirubin: 0.4 mg/dL (ref 0.2–1.2)
Total Protein: 7.5 g/dL (ref 6.0–8.3)

## 2014-10-31 LAB — LIPID PANEL
Cholesterol: 178 mg/dL (ref 0–200)
HDL: 30.4 mg/dL — ABNORMAL LOW (ref >39.00)
NonHDL: 147.6
Total CHOL/HDL Ratio: 6
Triglycerides: 235 mg/dL — ABNORMAL HIGH (ref 0.0–149.0)
VLDL: 47 mg/dL — ABNORMAL HIGH (ref 0.0–40.0)

## 2014-10-31 LAB — BASIC METABOLIC PANEL
BUN: 12 mg/dL (ref 6–23)
CHLORIDE: 107 meq/L (ref 96–112)
CO2: 24 mEq/L (ref 19–32)
Calcium: 9.2 mg/dL (ref 8.4–10.5)
Creatinine, Ser: 0.78 mg/dL (ref 0.40–1.20)
GFR: 76.26 mL/min (ref >60.00)
GLUCOSE: 125 mg/dL — AB (ref 70–99)
Potassium: 3.9 mEq/L (ref 3.5–5.1)
SODIUM: 135 meq/L (ref 135–145)

## 2014-10-31 LAB — LDL CHOLESTEROL, DIRECT: Direct LDL: 101 mg/dL

## 2014-10-31 NOTE — Progress Notes (Signed)
Pre visit review using our clinic review tool, if applicable. No additional management support is needed unless otherwise documented below in the visit note. 

## 2014-10-31 NOTE — Progress Notes (Signed)
Subjective:    Patient ID: Carmen Duncan, female    DOB: 1940/06/17, 74 y.o.   MRN: [id number]  HPI Patient seen for routine medical follow-up. She has history of dyslipidemia, hypertension, IBS, diverticulitis, essential tremor, chronic diastolic heart failure, BOOP,and history of cryptogenic stroke. She had transesophageal echo which did not show any PFO. She had event monitor recently but poor compliance with wearing monitor.. No atrial fibrillation noted. She is followed by pulmonary regarding her history of bronchiolitis obliterans with organizing pneumonia. Respiratory symptoms stable.  She has hyperlipidemia treated with Lipitor. Denies any myalgias. She has lost a few more pounds since last visit but states her appetite is stable. She has occasional fatigue. Very sedentary.  Current medications reviewed. She is taking her furosemide somewhat inconsistently. Compliant with all other medications.  Essential tremor stable on Topamax and low dose Propranolol.   Past Medical History  Diagnosis Date  . Arthritis   . Asthma   . Hypertension   . GERD (gastroesophageal reflux disease)   . Hyperlipidemia   . Diverticulosis   . IBS (irritable bowel syndrome)   . HOH (hard of hearing) left ear  . Acoustic neuroma   . Pneumonia     Dec. 2015  . Ejection fraction    Past Surgical History  Procedure Laterality Date  . Cholecystectomy  2011  . Abdominal hysterectomy  1980  . Tonsillectomy  1945  . Biopsy breast    . Eyes  2007    cararacts  . Tee without cardioversion N/A 09/05/2014    Procedure: TRANSESOPHAGEAL ECHOCARDIOGRAM (TEE);  Surgeon: Lelon Perla, MD;  Location[facility] ENDOSCOPY;  Service: Cardiovascular;  Laterality: N/A;    reports that she has never smoked. She has never used smokeless tobacco. She reports that she drinks alcohol. She reports that she does not use illicit drugs. family history includes Heart disease (age of onset: 20) in her father; Hyperlipidemia in her  father and mother; Hypertension in her mother; Stroke in her father. Allergies  Allergen Reactions  . Other Other (See Comments)    Pulls skin off.......Marland Kitchenpaper tape  . Latex Itching, Rash and Other (See Comments)    Red angry skin from latex tape  . Sulfonamide Derivatives Rash    hives  . Zocor [Simvastatin] Nausea Only     Review of Systems  Constitutional: Positive for fatigue. Negative for fever, chills and appetite change.  Respiratory: Negative for cough and shortness of breath.   Cardiovascular: Negative for chest pain, palpitations and leg swelling.  Gastrointestinal: Negative for abdominal pain.  Genitourinary: Negative for dysuria.  Neurological: Negative for dizziness and syncope.  Psychiatric/Behavioral: Negative for dysphoric mood.       Objective:   Physical Exam  Constitutional: She is oriented to person, place, and time. She appears well-developed and well-nourished.  HENT:  Mouth/Throat: Oropharynx is clear and moist.  Neck: No JVD present.  Cardiovascular: Normal rate and regular rhythm.   Pulmonary/Chest: Effort normal and breath sounds normal. No respiratory distress. She has no wheezes. She has no rales.  Musculoskeletal: She exhibits no edema.  Neurological: She is alert and oriented to person, place, and time.  Psychiatric: She has a normal mood and affect. Her behavior is normal.          Assessment & Plan:  #1 hyperlipidemia. Recheck lipid and hepatic panel. Continue Lipitor #2 hypertension which is stable and at goal. #3 history of essential tremor. Followed by neurology. She is maintained on low-dose propranolol  and Topamax.

## 2014-11-19 ENCOUNTER — Other Ambulatory Visit: Payer: Self-pay | Admitting: Family Medicine

## 2014-11-29 ENCOUNTER — Ambulatory Visit (HOSPITAL_COMMUNITY): Admission: RE | Admit: 2014-11-29 | Payer: Medicare Other | Source: Ambulatory Visit

## 2014-12-06 ENCOUNTER — Telehealth: Payer: Self-pay | Admitting: Neurology

## 2014-12-06 NOTE — Telephone Encounter (Signed)
Carmen Duncan - can you look at this please?

## 2014-12-06 NOTE — Telephone Encounter (Signed)
Carmen Duncan with [facility] called and needs an authorization for her MRI testing tomorrow/Dawn CB# [phone]

## 2014-12-07 ENCOUNTER — Ambulatory Visit (HOSPITAL_COMMUNITY): Admission: RE | Admit: 2014-12-07 | Payer: Medicare Other | Source: Ambulatory Visit

## 2014-12-16 ENCOUNTER — Ambulatory Visit (HOSPITAL_COMMUNITY)
Admission: RE | Admit: 2014-12-16 | Discharge: 2014-12-16 | Disposition: A | Discharge status: Home / Self Care | Payer: Medicare Other | Source: Ambulatory Visit | Attending: Neurology | Admitting: Neurology

## 2014-12-16 DIAGNOSIS — I739 Peripheral vascular disease, unspecified: Secondary | ICD-10-CM | POA: Diagnosis not present

## 2014-12-16 DIAGNOSIS — Z8673 Personal history of transient ischemic attack (TIA), and cerebral infarction without residual deficits: Secondary | ICD-10-CM | POA: Insufficient documentation

## 2014-12-16 DIAGNOSIS — I639 Cerebral infarction, unspecified: Secondary | ICD-10-CM

## 2014-12-16 DIAGNOSIS — D333 Benign neoplasm of cranial nerves: Secondary | ICD-10-CM

## 2014-12-16 DIAGNOSIS — R9389 Abnormal findings on diagnostic imaging of other specified body structures: Secondary | ICD-10-CM

## 2014-12-16 DIAGNOSIS — G319 Degenerative disease of nervous system, unspecified: Secondary | ICD-10-CM | POA: Diagnosis not present

## 2014-12-16 DIAGNOSIS — Z86018 Personal history of other benign neoplasm: Secondary | ICD-10-CM | POA: Diagnosis not present

## 2014-12-16 DIAGNOSIS — I10 Essential (primary) hypertension: Secondary | ICD-10-CM | POA: Diagnosis not present

## 2014-12-16 DIAGNOSIS — R531 Weakness: Secondary | ICD-10-CM | POA: Diagnosis not present

## 2014-12-16 DIAGNOSIS — R930 Abnormal findings on diagnostic imaging of skull and head, not elsewhere classified: Secondary | ICD-10-CM | POA: Diagnosis not present

## 2014-12-16 DIAGNOSIS — R938 Abnormal findings on diagnostic imaging of other specified body structures: Secondary | ICD-10-CM | POA: Diagnosis present

## 2014-12-16 LAB — CREATININE, SERUM
CREATININE: 0.82 mg/dL (ref 0.44–1.00)
GFR calc Af Amer: >60 mL/min (ref >60)

## 2014-12-16 MED ORDER — GADOBENATE DIMEGLUMINE 529 MG/ML IV SOLN
10.0000 mL | Freq: Once | INTRAVENOUS | Status: AC
  Administered 2014-12-16: 10 mL via INTRAVENOUS

## 2014-12-19 ENCOUNTER — Telehealth: Payer: Self-pay | Admitting: Neurology

## 2014-12-19 NOTE — Telephone Encounter (Signed)
-----   Message from [place], DO sent at 12/19/2014  9:08 AM EDT ----- Previous MRA in 07/2014 also showed atherosclerotic changes in cavernous segment of carotid so doubt this is aneurysm.  Luvenia Starch, you can let pt know that new MRI didn't show any new infarctions.  Has hx of known small acoustic neuroma/schwannoma and this was seen on this film as well.

## 2014-12-19 NOTE — Telephone Encounter (Signed)
Patient's son made aware of results.

## 2014-12-23 ENCOUNTER — Ambulatory Visit: Payer: Medicare Other | Admitting: Adult Health

## 2014-12-27 ENCOUNTER — Ambulatory Visit (INDEPENDENT_AMBULATORY_CARE_PROVIDER_SITE_OTHER): Payer: Medicare Other | Admitting: Adult Health

## 2014-12-27 ENCOUNTER — Encounter: Payer: Self-pay | Admitting: Adult Health

## 2014-12-27 VITALS — BP 124/66 | HR 99 | Temp 97.6°F | Ht 61.0 in | Wt 121.0 lb

## 2014-12-27 DIAGNOSIS — J8489 Other specified interstitial pulmonary diseases: Secondary | ICD-10-CM

## 2014-12-27 DIAGNOSIS — Z23 Encounter for immunization: Secondary | ICD-10-CM

## 2014-12-27 DIAGNOSIS — I5032 Chronic diastolic (congestive) heart failure: Secondary | ICD-10-CM | POA: Diagnosis not present

## 2014-12-27 NOTE — Assessment & Plan Note (Signed)
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  She does have some restriction /low dlco on PFT  No decline in function /act level off steroids  Can repeat spirometry on return in 3 month .  prevnar vaccine today .

## 2014-12-27 NOTE — Progress Notes (Signed)
   Subjective:    Patient ID: Carmen Duncan, female    DOB: 01-Aug-1940, 74 y.o.   MRN: [id number]  HPI  74yo never smoker with asthma, HTNfor follow-up of interstitial lung disease  Her son Carmen Duncan works for [employer] 03/2014 Seen for pulmonary consult during hospitalization for CAP for non-resolving infiltrates.   She presented with dyspnea on exertion for several months with occasional dry cough, acute fevers with sputum production for the 2 weeks, treated with Levaquin 10 days as an outpatient by PCP, neg cardiac evaluation by Dr. Ron Parker , history of Raynaud phenomenon exam findings of bilateral coarse crackles at bases, no pedal edema or JVD & labs showing hyponatremia (chronic), absence of leukocytosis.  She was treated with IV steroids and discharged on a slow prednisone taper  Significant tests/ events  CXR reviewed- bibasal infiltrates seem to have improved from 04/06/14, these are new when compared to old chest x-ray from 2011. 03/2014 HRCT scan -showed peripheral patchy consolidation bilaterally-infection versus COP  Swallow eval neg for aspiration  03/2014 echo >> grade 2 diastolic dysfunction with fluid overload and elevated BNP   09/23/2014  Chief Complaint  Patient presents with  . Follow-up    Prednisone complete. breathing doing well.  taking some time to get over pneumonia.  Overall feeling okay.  She thinks she may be allergic to some of her medications, she has been itchy   Stopped prednisone  -to prednisone for about 4 months and finally tapered by end of [facility] 2016 She continues to have blurred vision Leg swelling appears to have resolved-dyspnea is improved Denies chest pain, orthopnea, edema or hemoptysis She reports a dry cough, medication review shows lisinopril   12/27/2014 Follow up : BOOP /Diastolic CHF  Pt returns for 3 month follow up .  Says overall doing well since last ov  Changed from ACE to ARB last ov d/t cough  This seems improved  Says she  has mild DOE . Feels better overall.  HRCT chest in May showed resolution of aspdz /BOOP .  Residual GGO/bronchiolectasis in periphery .  Last done in April showed ESR at 23, Hypsensiivity panel neg ,  ANA positive but titer neg , CCP neg .  PFT showed mild to mod restriction FVC at 60%,  FEV1 at 64%, ratio 79,  with low DLCO at 50%.   She denies chest pain, discolored mucus orthopnea, edema or fever.  She is accompanied by her son , pharmacist at [employer] .     Review of Systems neg for any significant sore throat, dysphagia, itching, sneezing, nasal congestion or excess/ purulent secretions, fever, chills, sweats, unintended wt loss, pleuritic or exertional cp, hempoptysis, orthopnea pnd or change in chronic leg swelling. Also denies presyncope, palpitations, heartburn, abdominal pain, nausea, vomiting, diarrhea or change in bowel or urinary habits, dysuria,hematuria, rash, arthralgias, visual complaints, headache, numbness weakness or ataxia.     Objective:   Physical Exam  Gen. Pleasant, elderly , in no distress, normal affect ENT - no lesions, no post nasal drip Neck: No JVD, no thyromegaly, no carotid bruits Lungs: no use of accessory muscles, no dullness to percussion, bibasal rales , no rhonchi  Cardiovascular: Rhythm regular, heart sounds  normal, no murmurs or gallops, no peripheral edema Abdomen: soft and non-tender, no hepatosplenomegaly, BS normal. No guarding or rebound  Musculoskeletal: No deformities, no cyanosis or clubbing Neuro:  alert, non focal        Assessment & Plan:

## 2014-12-27 NOTE — Assessment & Plan Note (Signed)
Compensated without flare  Cont on current regimen  

## 2014-12-27 NOTE — Addendum Note (Signed)
Addended by: Osa Craver on: 12/27/2014 03:13 PM   Modules accepted: Orders

## 2014-12-27 NOTE — Patient Instructions (Addendum)
Prevnar vaccine today .  Continue on current regimen  follow up Dr. Elsworth Soho  In 3 months and As needed

## 2014-12-28 NOTE — Progress Notes (Signed)
Reviewed & agree with plan  

## 2015-01-04 ENCOUNTER — Telehealth: Payer: Self-pay | Admitting: Family Medicine

## 2015-01-04 ENCOUNTER — Encounter: Payer: Self-pay | Admitting: Family Medicine

## 2015-01-04 ENCOUNTER — Ambulatory Visit (INDEPENDENT_AMBULATORY_CARE_PROVIDER_SITE_OTHER): Payer: Medicare Other | Admitting: Family Medicine

## 2015-01-04 VITALS — BP 132/70 | HR 66 | Temp 97.7°F | Wt 119.0 lb

## 2015-01-04 DIAGNOSIS — R634 Abnormal weight loss: Secondary | ICD-10-CM | POA: Diagnosis not present

## 2015-01-04 DIAGNOSIS — R1033 Periumbilical pain: Secondary | ICD-10-CM

## 2015-01-04 DIAGNOSIS — R5383 Other fatigue: Secondary | ICD-10-CM

## 2015-01-04 LAB — CBC WITH DIFFERENTIAL/PLATELET
BASOS PCT: 0.9 % (ref 0.0–3.0)
Basophils Absolute: 0 10*3/uL (ref 0.0–0.1)
EOS PCT: 3.7 % (ref 0.0–5.0)
Eosinophils Absolute: 0.2 10*3/uL (ref 0.0–0.7)
HEMATOCRIT: 44 % (ref 36.0–46.0)
HEMOGLOBIN: 14.7 g/dL (ref 12.0–15.0)
LYMPHS ABS: 1.2 10*3/uL (ref 0.7–4.0)
Lymphocytes Relative: 23.5 % (ref 12.0–46.0)
MCHC: 33.4 g/dL (ref 30.0–36.0)
MCV: 86.9 fl (ref 78.0–100.0)
MONO ABS: 0.7 10*3/uL (ref 0.1–1.0)
Monocytes Relative: 13.2 % — ABNORMAL HIGH (ref 3.0–12.0)
NEUTROS ABS: 3.1 10*3/uL (ref 1.4–7.7)
NEUTROS PCT: 58.7 % (ref 43.0–77.0)
Platelets: 334 10*3/uL (ref 150.0–400.0)
RBC: 5.06 Mil/uL (ref 3.87–5.11)
RDW: 15.2 % (ref 11.5–15.5)
WBC: 5.2 10*3/uL (ref 4.0–10.5)

## 2015-01-04 LAB — COMPREHENSIVE METABOLIC PANEL
ALBUMIN: 3.8 g/dL (ref 3.5–5.2)
ALT: 13 U/L (ref 0–35)
AST: 18 U/L (ref 0–37)
Alkaline Phosphatase: 64 U/L (ref 39–117)
BILIRUBIN TOTAL: 0.3 mg/dL (ref 0.2–1.2)
BUN: 14 mg/dL (ref 6–23)
CALCIUM: 9.7 mg/dL (ref 8.4–10.5)
CHLORIDE: 105 meq/L (ref 96–112)
CO2: 28 mEq/L (ref 19–32)
Creatinine, Ser: 0.81 mg/dL (ref 0.40–1.20)
GFR: 72.97 mL/min (ref >60.00)
Glucose, Bld: 90 mg/dL (ref 70–99)
POTASSIUM: 4.3 meq/L (ref 3.5–5.1)
SODIUM: 140 meq/L (ref 135–145)
TOTAL PROTEIN: 7.9 g/dL (ref 6.0–8.3)

## 2015-01-04 LAB — VITAMIN B12: VITAMIN B 12: 177 pg/mL — AB (ref 211–911)

## 2015-01-04 LAB — SEDIMENTATION RATE: Sed Rate: 17 mm/hr (ref 0–22)

## 2015-01-04 LAB — TSH: TSH: 2.79 u[IU]/mL (ref 0.35–4.50)

## 2015-01-04 NOTE — Progress Notes (Signed)
Pre visit review using our clinic review tool, if applicable. No additional management support is needed unless otherwise documented below in the visit note. 

## 2015-01-04 NOTE — Telephone Encounter (Signed)
Spoke with son. Son thinks that the patient is having a memory problem. And also that she is going to say that she is feeling bad pt says that everyday.

## 2015-01-04 NOTE — Progress Notes (Signed)
Subjective:    Patient ID: Carmen Duncan, female    DOB: September 20, 1940, 74 y.o.   MRN: [id number]  HPI Patient seen with multiple symptoms including progressive weight loss over the past several months, fatigue, occasional nausea without vomiting, and for the past few weeks she's had some mild pain around her umbilicus region. No diarrhea or other stool change.  Her chronic problems include remote history of acoustic neuroma, dyslipidemia, hypertension, history of GERD, history of IBS, squamous cell skin cancer, essential tremor, history of BOOP, chronic diastolic heart failure, and pulmonary hypertension. Family had some concerns that she has been depressed but she scores 3 on pH Q-9 today and she denies feeling depressed at all.  She is eating 3 meals per day but generally very small breakfast. She had chest CT in May with no acute findings. Recent mammogram in February normal. Colonoscopy estimated 8 years ago. She had recent MRI brain per neurology. She's been followed regularly by neurology and pulmonary.   Her weight was 139 pounds here in January and this is currently 119 pounds  She has had some nonspecific bilateral lower rib cage pain which seems to be somewhat migratory. No night sweats. No fevers or chills.  Past Medical History  Diagnosis Date  . Arthritis   . Asthma   . Hypertension   . GERD (gastroesophageal reflux disease)   . Hyperlipidemia   . Diverticulosis   . IBS (irritable bowel syndrome)   . HOH (hard of hearing) left ear  . Acoustic neuroma   . Pneumonia     Dec. 2015  . Ejection fraction    Past Surgical History  Procedure Laterality Date  . Cholecystectomy  2011  . Abdominal hysterectomy  1980  . Tonsillectomy  1945  . Biopsy breast    . Eyes  2007    cararacts  . Tee without cardioversion N/A 09/05/2014    Procedure: TRANSESOPHAGEAL ECHOCARDIOGRAM (TEE);  Surgeon: Lelon Perla, MD;  Location: [facility] ENDOSCOPY;  Service: Cardiovascular;  Laterality: N/A;      reports that she has never smoked. She has never used smokeless tobacco. She reports that she drinks alcohol. She reports that she does not use illicit drugs. family history includes Heart disease (age of onset: 57) in her father; Hyperlipidemia in her father and mother; Hypertension in her mother; Stroke in her father. Allergies  Allergen Reactions  . Other Other (See Comments)    Pulls skin off.......Marland Kitchenpaper tape  . Latex Itching, Rash and Other (See Comments)    Red angry skin from latex tape  . Sulfonamide Derivatives Rash    hives  . Zocor [Simvastatin] Nausea Only     Review of Systems  Constitutional: Positive for fatigue and unexpected weight change. Negative for fever and chills.  HENT: Negative for trouble swallowing.   Respiratory: Negative for cough and shortness of breath.   Cardiovascular: Negative for chest pain, palpitations and leg swelling.  Gastrointestinal: Positive for nausea and abdominal pain. Negative for vomiting, diarrhea, constipation, blood in stool, abdominal distention and anal bleeding.  Genitourinary: Negative for dysuria.  Musculoskeletal: Negative for back pain.  Neurological: Negative for dizziness.  Hematological: Negative for adenopathy.  Psychiatric/Behavioral: Negative for dysphoric mood.       Objective:   Physical Exam  Constitutional: She is oriented to person, place, and time. She appears well-developed and well-nourished.  HENT:  Right Ear: External ear normal.  Left Ear: External ear normal.  Mouth/Throat: Oropharynx is clear and moist.  Neck: Neck supple. No thyromegaly present.  Cardiovascular: Normal rate and regular rhythm.   Pulmonary/Chest: Effort normal and breath sounds normal. No respiratory distress. She has no wheezes. She has no rales.  Abdominal: Soft. Bowel sounds are normal. She exhibits no distension and no mass. There is no rebound and no guarding.  Minimal tenderness umbilicus region. No palpable hernia. No  guarding or rebound. No mass  Musculoskeletal: She exhibits no edema.  Lymphadenopathy:    She has no cervical adenopathy.  Neurological: She is alert and oriented to person, place, and time. No cranial nerve deficit.  Skin: No rash noted.  Psychiatric: She has a normal mood and affect. Her behavior is normal.          Assessment & Plan:  Patient presents with nonspecific symptoms of fatigue, weight loss, decreased appetite. Etiology unclear. Screening does not suggest any likely depression (PHQ-9 score of 3). She does have some nonspecific periumbilical pain but no evidence for obvious hernia. No acute abdomen.  Check further labs with comprehensive metabolic panel, CBC, TSH, B12, sedimentation rate. Set up CT abdomen and pelvis to further evaluate  She is on Topamax for tremor which can contribute some to weight loss.

## 2015-01-04 NOTE — Telephone Encounter (Signed)
mitch would like to speak w/ uyou prior to pt's 9:45 am appt.   He would like to convey some things he does not think pt will. pls call back asap.

## 2015-01-06 ENCOUNTER — Ambulatory Visit (INDEPENDENT_AMBULATORY_CARE_PROVIDER_SITE_OTHER)
Admission: RE | Admit: 2015-01-06 | Discharge: 2015-01-06 | Disposition: A | Discharge status: Home / Self Care | Payer: Medicare Other | Source: Ambulatory Visit | Attending: Family Medicine | Admitting: Family Medicine

## 2015-01-06 DIAGNOSIS — R1033 Periumbilical pain: Secondary | ICD-10-CM | POA: Diagnosis not present

## 2015-01-06 DIAGNOSIS — R634 Abnormal weight loss: Secondary | ICD-10-CM

## 2015-01-06 MED ORDER — IOHEXOL 300 MG/ML  SOLN
100.0000 mL | Freq: Once | INTRAMUSCULAR | Status: AC | PRN
  Administered 2015-01-06: 100 mL via INTRAVENOUS

## 2015-01-09 ENCOUNTER — Ambulatory Visit (INDEPENDENT_AMBULATORY_CARE_PROVIDER_SITE_OTHER): Payer: Medicare Other | Admitting: Family Medicine

## 2015-01-09 DIAGNOSIS — E538 Deficiency of other specified B group vitamins: Secondary | ICD-10-CM | POA: Diagnosis not present

## 2015-01-09 MED ORDER — CYANOCOBALAMIN 1000 MCG/ML IJ SOLN
1000.0000 ug | Freq: Once | INTRAMUSCULAR | Status: AC
  Administered 2015-01-09: 1000 ug via INTRAMUSCULAR

## 2015-01-16 ENCOUNTER — Ambulatory Visit (INDEPENDENT_AMBULATORY_CARE_PROVIDER_SITE_OTHER): Payer: Medicare Other | Admitting: Family Medicine

## 2015-01-16 DIAGNOSIS — E538 Deficiency of other specified B group vitamins: Secondary | ICD-10-CM

## 2015-01-16 MED ORDER — CYANOCOBALAMIN 1000 MCG/ML IJ SOLN
1000.0000 ug | Freq: Once | INTRAMUSCULAR | Status: AC
  Administered 2015-01-16: 1000 ug via INTRAMUSCULAR

## 2015-01-23 ENCOUNTER — Ambulatory Visit: Payer: Medicare Other | Admitting: Family Medicine

## 2015-01-24 ENCOUNTER — Ambulatory Visit (INDEPENDENT_AMBULATORY_CARE_PROVIDER_SITE_OTHER): Payer: Medicare Other | Admitting: *Deleted

## 2015-01-24 DIAGNOSIS — E538 Deficiency of other specified B group vitamins: Secondary | ICD-10-CM

## 2015-01-24 MED ORDER — CYANOCOBALAMIN 1000 MCG/ML IJ SOLN
1000.0000 ug | Freq: Once | INTRAMUSCULAR | Status: AC
  Administered 2015-01-24: 1000 ug via INTRAMUSCULAR

## 2015-01-25 ENCOUNTER — Telehealth: Payer: Self-pay | Admitting: Family Medicine

## 2015-01-25 MED ORDER — PROPRANOLOL HCL 10 MG PO TABS: 10.0000 mg | ORAL_TABLET | Freq: Every day | ORAL | Status: DC

## 2015-01-25 NOTE — Telephone Encounter (Signed)
Rx sent to pharmacy   

## 2015-01-25 NOTE — Telephone Encounter (Signed)
Pt request refill of the following: propranolol (INDERAL) 10 MG tablet   Phamacy:  [facility]

## 2015-01-31 ENCOUNTER — Ambulatory Visit (INDEPENDENT_AMBULATORY_CARE_PROVIDER_SITE_OTHER): Payer: Medicare Other | Admitting: Family Medicine

## 2015-01-31 DIAGNOSIS — E538 Deficiency of other specified B group vitamins: Secondary | ICD-10-CM | POA: Diagnosis not present

## 2015-01-31 MED ORDER — CYANOCOBALAMIN 1000 MCG/ML IJ SOLN
1000.0000 ug | Freq: Once | INTRAMUSCULAR | Status: AC
  Administered 2015-01-31: 1000 ug via INTRAMUSCULAR

## 2015-02-01 ENCOUNTER — Encounter: Payer: Self-pay | Admitting: Family Medicine

## 2015-02-01 ENCOUNTER — Ambulatory Visit (INDEPENDENT_AMBULATORY_CARE_PROVIDER_SITE_OTHER): Payer: Medicare Other | Admitting: Family Medicine

## 2015-02-01 VITALS — BP 130/70 | HR 80 | Temp 97.4°F | Wt 118.0 lb

## 2015-02-01 DIAGNOSIS — R634 Abnormal weight loss: Secondary | ICD-10-CM

## 2015-02-01 DIAGNOSIS — R5383 Other fatigue: Secondary | ICD-10-CM | POA: Diagnosis not present

## 2015-02-01 NOTE — Patient Instructions (Signed)
Constipation  Constipation is when a person has fewer than three bowel movements a week, has difficulty having a bowel movement, or has stools that are dry, hard, or larger than normal. As people grow older, constipation is more common. If you try to fix constipation with medicines that make you have a bowel movement (laxatives), the problem may get worse. Long-term laxative use may cause the muscles of the colon to become weak. A low-fiber diet, not taking in enough fluids, and taking certain medicines may make constipation worse.   CAUSES   · Certain medicines, such as antidepressants, pain medicine, iron supplements, antacids, and water pills.    · Certain diseases, such as diabetes, irritable bowel syndrome (IBS), thyroid disease, or depression.    · Not drinking enough water.    · Not eating enough fiber-rich foods.    · Stress or travel.    · Lack of physical activity or exercise.    · Ignoring the urge to have a bowel movement.    · Using laxatives too much.    SIGNS AND SYMPTOMS   · Having fewer than three bowel movements a week.    · Straining to have a bowel movement.    · Having stools that are hard, dry, or larger than normal.    · Feeling full or bloated.    · Pain in the lower abdomen.    · Not feeling relief after having a bowel movement.    DIAGNOSIS   Your health care provider will take a medical history and perform a physical exam. Further testing may be done for severe constipation. Some tests may include:  · A barium enema X-ray to examine your rectum, colon, and, sometimes, your small intestine.    · A sigmoidoscopy to examine your lower colon.    · A colonoscopy to examine your entire colon.  TREATMENT   Treatment will depend on the severity of your constipation and what is causing it. Some dietary treatments include drinking more fluids and eating more fiber-rich foods. Lifestyle treatments may include regular exercise. If these diet and lifestyle recommendations do not help, your health care  provider may recommend taking over-the-counter laxative medicines to help you have bowel movements. Prescription medicines may be prescribed if over-the-counter medicines do not work.   HOME CARE INSTRUCTIONS   · Eat foods that have a lot of fiber, such as fruits, vegetables, whole grains, and beans.  · Limit foods high in fat and processed sugars, such as french fries, hamburgers, cookies, candies, and soda.    · A fiber supplement may be added to your diet if you cannot get enough fiber from foods.    · Drink enough fluids to keep your urine clear or pale yellow.    · Exercise regularly or as directed by your health care provider.    · Go to the restroom when you have the urge to go. Do not hold it.    · Only take over-the-counter or prescription medicines as directed by your health care provider. Do not take other medicines for constipation without talking to your health care provider first.    SEEK IMMEDIATE MEDICAL CARE IF:   · You have bright red blood in your stool.    · Your constipation lasts for more than 4 days or gets worse.    · You have abdominal or rectal pain.    · You have thin, pencil-like stools.    · You have unexplained weight loss.  MAKE SURE YOU:   · Understand these instructions.  · Will watch your condition.  · Will get help right away if you are not   you have with your health care provider.  Consider Miralax as needed for constipation. Consider daily nutrition supplement with Ensure.

## 2015-02-01 NOTE — Progress Notes (Signed)
Subjective:    Patient ID: Carmen Duncan, female    DOB: Apr 16, 1941, 74 y.o.   MRN: [id number]  HPI Patient seen with nonspecific symptoms of fatigue. Recently she was seen and B12 deficient with B12 level 177. We've been giving IM replacement and she felt slightly better initially regarding energy levels but still very fatigued and generally much less active physically over recent weeks. She's had some further decline in weight with 129 pounds in June to 118 currently. She denies any dyspnea, chest pains, urinary symptoms, abdominal pain. She had MRI of the brain recently that showed no acute findings. Recent CT abdomen pelvis in August no acute findings. CT of chest per pulmonary couple months ago no acute findings.  We did several labs back in August which were all basically normal except for the low B12 above. Her sedimentation rate was 17.  Her chronic problems include remote history of acoustic neuroma, dyslipidemia, hypertension, GERD, IBS, essential tremor, Raynaud's phenomena, diastolic heart failure, history of BOOP.  She does take Topamax for essential tremor and her tremor has been well controlled.  She's had some recent constipation issues. Denies any bloody stools. No diarrhea. Using senna with minimal results. Usually has bowel movement about every 3 days  Past Medical History  Diagnosis Date  . Arthritis   . Asthma   . Hypertension   . GERD (gastroesophageal reflux disease)   . Hyperlipidemia   . Diverticulosis   . IBS (irritable bowel syndrome)   . HOH (hard of hearing) left ear  . Acoustic neuroma   . Pneumonia     Dec. 2015  . Ejection fraction    Past Surgical History  Procedure Laterality Date  . Cholecystectomy  2011  . Abdominal hysterectomy  1980  . Tonsillectomy  1945  . Biopsy breast    . Eyes  2007    cararacts  . Tee without cardioversion N/A 09/05/2014    Procedure: TRANSESOPHAGEAL ECHOCARDIOGRAM (TEE);  Surgeon: Lelon Perla, MD;  Location: [facility]  ENDOSCOPY;  Service: Cardiovascular;  Laterality: N/A;    reports that she has never smoked. She has never used smokeless tobacco. She reports that she drinks alcohol. She reports that she does not use illicit drugs. family history includes Heart disease (age of onset: 43) in her father; Hyperlipidemia in her father and mother; Hypertension in her mother; Stroke in her father. Allergies  Allergen Reactions  . Other Other (See Comments)    Pulls skin off.......Marland Kitchenpaper tape  . Latex Itching, Rash and Other (See Comments)    Red angry skin from latex tape  . Sulfonamide Derivatives Rash    hives  . Zocor [Simvastatin] Nausea Only       Review of Systems  Constitutional: Positive for appetite change, fatigue and unexpected weight change. Negative for fever and chills.  HENT: Negative for trouble swallowing.   Respiratory: Negative for cough.   Cardiovascular: Negative for chest pain and leg swelling.  Gastrointestinal: Positive for constipation. Negative for abdominal pain and blood in stool.  Genitourinary: Negative for dysuria and hematuria.  Neurological: Positive for weakness. Negative for syncope and headaches.  Psychiatric/Behavioral: Negative for confusion.       Objective:   Physical Exam  Constitutional: She appears well-developed and well-nourished. No distress.  HENT:  Mouth/Throat: Oropharynx is clear and moist.  Neck: Neck supple. No thyromegaly present.  Cardiovascular: Normal rate and regular rhythm.   Pulmonary/Chest: Effort normal and breath sounds normal. No respiratory distress. She has no  wheezes. She has no rales.  Abdominal: Soft. She exhibits no distension and no mass. There is no tenderness. There is no rebound and no guarding.  Musculoskeletal: She exhibits no edema.  Lymphadenopathy:    She has no cervical adenopathy.          Assessment & Plan:  Weight loss and fatigue. Nonfocal exam. She's had extensive workup above recent including CT chest, CT  abdomen /pelvis, MRI brain, and multiple labs significant only for low B12 level. She does not have any evidence clinically to suggest significant depression. She is on Topamax for essential tremor which we explained could be contributing to her weight loss- but not sure this would account for this degree of weight loss. We've recommended supplement with ensure. We recommended consider referral to register dietitian and she declines.

## 2015-02-01 NOTE — Progress Notes (Signed)
Pre visit review using our clinic review tool, if applicable. No additional management support is needed unless otherwise documented below in the visit note. 

## 2015-02-06 ENCOUNTER — Ambulatory Visit (INDEPENDENT_AMBULATORY_CARE_PROVIDER_SITE_OTHER): Payer: Medicare Other | Admitting: Neurology

## 2015-02-06 ENCOUNTER — Encounter: Payer: Self-pay | Admitting: Neurology

## 2015-02-06 VITALS — BP 140/80 | HR 84 | Ht 61.0 in | Wt 117.2 lb

## 2015-02-06 DIAGNOSIS — Z8673 Personal history of transient ischemic attack (TIA), and cerebral infarction without residual deficits: Secondary | ICD-10-CM

## 2015-02-06 DIAGNOSIS — G250 Essential tremor: Secondary | ICD-10-CM

## 2015-02-06 DIAGNOSIS — R634 Abnormal weight loss: Secondary | ICD-10-CM | POA: Diagnosis not present

## 2015-02-06 NOTE — Patient Instructions (Addendum)
1.  Hold topamax 2.  Call me if tremor increases and we will increase your propranolol again 3.  Make sure that you are taking plavix at home

## 2015-02-06 NOTE — Progress Notes (Signed)
Subjective:   Carmen Duncan was seen in consultation in the movement disorder clinic at the request of [name], MD.  The evaluation is for tremor.  The patient is a 74 y.o. R handed female with a long hx of tremor.   The patient states that the tremor is in the L hand.  She notes it with activation.  If pouring something from a saucepan, it will start to tremor.  It seems to be worse in specific positions.  She has no trouble with putting on makeup or eating as this only affects her nondominant hand.  She has no trouble cutting food.  She has no vocal tremor, head tremor or RUE tremor.  She has no leg tremor.  Sometimes she feels like she is "walking like a drunk" because she is not stable.  She does not feel comfortable near the edge of a pier b/c she feels like she would fall off the edge.  She generally does not fall.  She did fall Jul 16, 2011 because she missed the last 3 steps at her home when she was coming down in a hurry.  She bruised her entire R leg and it is still very sore.    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  I do not have those records.  She saw Dr. [name].  She is on propranolol and she has been on it 7-8 years.  She is on 40 mg twice per day.  It does help.  She states that she was told that her propranolol could not be increased.  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  She sees a Publishing rights manager at [facility] and was told it was nothing to worry about.  She has it monitored q 3 years.  Her last MRI brain was done in [place].   Because of this, she will have intermittent numbness of the L face.    There was a fam hx of tremor in her sister and it is starting in her son, who is 63 years old.  Current/Previously tried tremor medications: propranolol, primidone  Current medications that may exacerbate tremor:  N/a  12/03/12:  She was started on primidone last visit.  She is only taking 1/2 of  a 50 mg tablet at night.  She didn't go up to a full tablet because she did not like the way that it made her feel.  She wants to d/c the medication.  She is on propranolol but the dose cannot be increased because her pulse is already on the low end of normal.  She is having some intermittent paresthesias in her fingertips, right more than left.  03/12/13:  Topamax was added last visit, which has helped but has decreased appetite.  She tremors much less now but notices it when stressed, fatigued or under pressure.  She c/o constipation with the topamax.  She has taste aversion with the medication.  She has some paresthesias with the medication.  She remains on propranolol but is off of the primidone.  She thinks that the SE are worth continuing to take the medication.  She has had worsening of vision but thinks that she had that prior to the topamax and has an eye appt Nov 4.  She worked on last visit.  B12 was reported.  Serum protein electrophoresis did not reveal M spike.  RPR was negative.  Urinary protein electrophoresis  showed some free kappa light chains but there was no monoclonal protein, no M spike.  RPR was negative.  07/28/13 update:  The pt was on topamax 200 mg daily.  She was unable to tolerate bid dosing of the medication.  She ended up going back to 100 mg at night and it makes her sleepy at night when she takes the medication but no hang over effect.  She still has some intermittent paresthesias.    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  She does c/o dull fronal headaches, daily for the last month.  She uses sinus rinse and it helps.  States that tremor has been stable.    02/03/14 update:  The patient has a history of essential tremor, and is currently on propranolol 40 mg once a day as well as Topamax 100 mg daily.   Unless she is very tired or upset, she  does well in regards to tremor.   She received notes from [name] at [facility] since last visit.  He just said that her schwannoma was stable in size and she just needs a repeat MRI for 10 years that they will plan on doing.  She did have a repeat urinary protein electrophoresis since our last visit that was unremarkable.  I had the opportunity to review notes from her primary care physician from 01/20/2014.  There were 2 brief episodes that the patient describes in which she was walking and her legs felt weak.  She felt near syncopal.  She was not dizzy but just stated that "I couldn't have moved if I had to."    It only lasted for a few minutes.  She had already decreased the propranolol on her own when these episodes happened.  She did that primarily because she would forget to take it bid but also because of fatigue.    08/04/14 update:  The patient returns today for follow-up.  Much has happened since her last visit.  Medical records were reviewed.  She has a history of essential tremor and was on propranolol 40 mg, but that has since been reduced to what medical records said was 10 mg daily but pt states is 5 mg daily because of other medical issues.  She remains on Topamax 100 mg daily.  In November, 2015, she went to her primary care physician complaining about malaise and she had a nuclear stress test that was a low risk study with a left ventricular ejection fraction of 83%.  She saw cardiology not long thereafter and ended up having a chest x-ray demonstrating bibasilar infiltrates.  A few days later she developed a fever and was treated for community-acquired pneumonia.  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  During that hospitalization, her propranolol was decreased from 40 mg daily to 10 mg daily because of hypotension.  She became more weak during that hospitalization and was discharged to a subacute rehabilitation facility and ultimately left that rehabilitation  facility and was discharged home on December 23.  She does think that tremor may have increased slightly.  08/30/14 update:  The patient is following up today at the request of her ophthalmologist.  Pt states that she just went for a "normal checkup" because her vision had changed a little and "I needed new frames."  She states that her eye doctor thought that  there was a stroke behind the left eye and that led to an MRI of the brain and MRA of the brain on 08/23/2014.  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  There was an enhancing lesion in the right parietal region as well.  I talked on the phone with the radiologist about this lesion and he felt that this was likely a subacute vascular insult and not a metastatic lesion.  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  She had a carotid ultrasound on 07/14/2014 demonstrating 1-39% stenosis bilaterally.  She did an echocardiogram last year when she was in the hospital on 04/20/2014 demonstrating an ejection fraction of 65-70%, moderate diastolic dysfunction and moderate pulmonary HTN  02/06/15 update:  The patient returns today for follow-up.  She is on Topamax, 100 mg daily and propranolol, 10 mg daily for essential tremor.  At our last visit, I had the patient discontinue her aspirin and we changed it to Plavix.  She is doing well on that.  No melena/hematochezia.  No falls.  She had a TEE that was negative for thrombus and demonstrated normal LV function.  She was supposed to wear an event monitor, and she did, but compliance was very limited and she only wore it for part of a few days, so there was limited data.  There was no arrhythmia when she did wear it.  She had a repeat MRI of the brain on 12/16/2014.  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  On prior MRAs this is consistent with atherosclerotic changes.   I have reviewed primary care records since our last visit.  She has been losing weight over the last several months and her primary care physician asked me if it was from the Topamax.  She has had a fairly extensive negative workup.  Although I doubt it given the amount of time that she has been on Topamax, I told him that she could discontinue it.  They opted to leave her on it for now and take a wait and see approach.  She states that she has no appetite but she is forcing herself to eat.    Outside reports reviewed: historical medical records.  Allergies  Allergen Reactions  . Other Other (See Comments)    Pulls skin off.......Marland Kitchenpaper tape  . Latex Itching, Rash and Other (See Comments)    Red angry skin from latex tape  . Sulfonamide Derivatives Rash    hives  . Zocor [Simvastatin] Nausea Only    Current Outpatient Prescriptions on File Prior to Visit  Medication Sig Dispense Refill  . acetaminophen (TYLENOL) 325 MG tablet Take 650 mg by mouth every 6 (six) hours as needed for headache.    [name] atorvastatin (LIPITOR) 20 MG tablet Take 1 tablet (20 mg total) by mouth daily. 30 tablet 5  . cetirizine (ZYRTEC) 10 MG tablet Take 10 mg by mouth daily. Seasonally:  As needed    . clidinium-chlordiazePOXIDE (LIBRAX) 5-2.5 MG per capsule Take 1 capsule by mouth 2 (two) times daily as needed. 60 capsule 3  . clopidogrel (PLAVIX) 75 MG tablet Take 1 tablet (75 mg total) by mouth daily. 30 tablet 5  .  furosemide (LASIX) 20 MG tablet Take 1 tablet (20 mg total) by mouth daily. 30 tablet 5  . losartan (COZAAR) 100 MG tablet Take 1 tablet (100 mg total) by mouth daily. 30 tablet 11  . pantoprazole (PROTONIX) 40 MG tablet TAKE ONE TABLET BY MOUTH ONCE DAILY 30 tablet 5  . propranolol (INDERAL) 10 MG tablet Take 1 tablet (10 mg total)  by mouth at bedtime. 30 tablet 0  . topiramate (TOPAMAX) 50 MG tablet TAKE TWO TABLETS BY MOUTH AT BEDTIME. 60 tablet 5  . triamcinolone cream (KENALOG) 0.1 % daily.  0   No current facility-administered medications on file prior to visit.    Past Medical History  Diagnosis Date  . Arthritis   . Asthma   . Hypertension   . GERD (gastroesophageal reflux disease)   . Hyperlipidemia   . Diverticulosis   . IBS (irritable bowel syndrome)   . HOH (hard of hearing) left ear  . Acoustic neuroma   . Pneumonia     Dec. 2015  . Ejection fraction     Past Surgical History  Procedure Laterality Date  . Cholecystectomy  2011  . Abdominal hysterectomy  1980  . Tonsillectomy  1945  . Biopsy breast    . Eyes  2007    cararacts  . Tee without cardioversion N/A 09/05/2014    Procedure: TRANSESOPHAGEAL ECHOCARDIOGRAM (TEE);  Surgeon: Lelon Perla, MD;  Location: [facility] ENDOSCOPY;  Service: Cardiovascular;  Laterality: N/A;    Social History   Social History  . Marital Status: Widowed    Spouse Name: N/A  . Number of Children: N/A  . Years of Education: N/A   Occupational History  . Not on file.   Social History Main Topics  . Smoking status: Never Smoker   . Smokeless tobacco: Never Used  . Alcohol Use: 0.0 oz/week    0 Standard drinks or equivalent per week     Comment: wine about 3 times a week  . Drug Use: No  . Sexual Activity:    Partners: Female   Other Topics Concern  . Not on file   Social History Narrative    Family Status  Relation Status Death Age  . Mother Deceased     acute leukemia  . Father Deceased     MI, CVA  . Sister Alive     tremor  . Child Alive     son, mild tremor    Review of Systems A complete 10 system ROS was obtained and was negative apart from what is mentioned.   Objective:   VITALS:   Filed Vitals:   02/06/15 1246  BP: 140/80  Pulse: 84  Height: 5\' 1"  (1.549 m)  Weight: 117 lb 3 oz (53.156 kg)  SpO2: 96%   Wt Readings  from Last 3 Encounters:  02/06/15 117 lb 3 oz (53.156 kg)  02/01/15 118 lb (53.524 kg)  01/04/15 119 lb (53.978 kg)   Gen:  Appears stated age and in NAD. HEENT:  Normocephalic, atraumatic. The mucous membranes are moist. The superficial temporal arteries are without ropiness or tenderness. Cardiovascular: Regular rate and rhythm. Lungs: Clear to auscultation bilaterally. Neck: There are no carotid bruits noted bilaterally.  NEUROLOGICAL:  Orientation:  The patient is alert and oriented x 3.  Recent and remote memory are intact.  Attention span and concentration are normal.  Able to name objects and repeat without trouble.  Fund of knowledge is appropriate Cranial nerves: There is good facial  symmetry.  There is pseudoptosis from lid lag bilaterally.   The pupils are equal round and reactive to light bilaterally. Fundoscopic exam reveals clear disc margins bilaterally. Extraocular muscles are intact and visual fields are full to confrontational testing. Speech is fluent and clear. Soft palate rises symmetrically and there is no tongue deviation. Hearing is intact to conversational tone. Tone: Tone is good throughout.  There is no rigidity. Sensation: Sensation is intact to light touch and pinprick throughout (facial, trunk, extremities). Vibration is decreased at the bilateral big toe. There is no extinction with double simultaneous stimulation. There is no sensory dermatomal level identified. Coordination:  The patient has no dysdiadichokinesia or dysmetria.  No asymmetries. Motor: Strength is 5/5 in the bilateral upper and lower extremities.  Shoulder shrug is equal bilaterally.  There is no pronator drift.  There are no fasciculations noted. DTR's: Deep tendon reflexes are 1/4 at the bilateral biceps, triceps, brachioradialis, left patella (did not test at the right patella because of pain) and absent at the bilateral achilles.  Plantar responses are downgoing bilaterally. Gait and Station:  The patient is able to ambulate without difficulty. She has some difficulty ambulating in a tandem fashion.  She sways in the Romberg position, but was able to stay in place with eyes closed.  MOVEMENT EXAM: Tremor:  There is minimal tremor of L hand, most obvious when trying to draw archimedes spirals.      LABS  Lab Results  Component Value Date   WBC 5.2 01/04/2015   HGB 14.7 01/04/2015   HCT 44.0 01/04/2015   MCV 86.9 01/04/2015   PLT 334.0 01/04/2015     Chemistry      Component Value Date/Time   NA 140 01/04/2015 1029   NA 139 05/03/2014 0630   K 4.3 01/04/2015 1029   K 3.2* 05/03/2014 0630   CL 105 01/04/2015 1029   CL 103 05/03/2014 0630   CO2 28 01/04/2015 1029   CO2 33* 05/03/2014 0630   BUN 14 01/04/2015 1029   BUN 17 05/03/2014 0630   CREATININE 0.81 01/04/2015 1029   CREATININE 0.82 05/03/2014 0630      Component Value Date/Time   CALCIUM 9.7 01/04/2015 1029   CALCIUM 8.4* 05/03/2014 0630   ALKPHOS 64 01/04/2015 1029   AST 18 01/04/2015 1029   ALT 13 01/04/2015 1029   BILITOT 0.3 01/04/2015 1029     Lab Results  Component Value Date   TSH 2.79 01/04/2015      Assessment/Plan:   1.  Essential Tremor.  -Pt is on propranolol at reduced dose of 10 mg now but feels fine on this.  She and I discussed that we may need to go up on this again if tremor increases as we d/c the topamax.  She currently has a 40 mg  -We talked about the topamax.  She would like to continue on the medication, 100 mg nightly.   There is no history of nephrolithiasis.  There is no history of acute angle closure glaucoma. 2.  Mild gait instability.  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  This seems overall very mild.  We talked about the diagnosis.  -w/u for reversible causes was negative  -We talked about safety associated with peripheral neuropathy. 3.  Acoustic neuroma.  -This is being followed by ENT and neurosurgery at [facility].  Very small  4.   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  -  Long discussion with the patient today.  Greater than 50% of the 40 min visit was spent in counseling.  We will discontinue her aspirin and change her to Plavix.  Stroke signs and symptoms discussed.  If she has any of these in the future she is to call 911.  -Given that she has evidence on MRI of 2 different vascular territory acute/subacute infarctions, I think that she needs a TEE.  She has already had a TTE.  I will send her to cardiology for consideration of this as well as Holter monitor.  She has seen Dr. Ron Parker and will set up appt with him  -I do think that we should repeat MRI imaging in about 4 months. 5.  F/u after above completed.  Spoke with radiologist as above.  Pt also called her son while in room and asked me to leave him a v/m pertaining to above information and I did that.   Subjective:   Carmen Duncan was seen in consultation in the movement disorder clinic at the request of [name], MD.  The evaluation is for tremor.  The patient is a 74 y.o. R handed female with a long hx of tremor.   The patient states that the tremor is in the L hand.  She notes it with activation.  If pouring something from a saucepan, it will start to tremor.  It seems to be worse in specific positions.  She has no trouble with putting on makeup or eating as this only affects her nondominant hand.  She has no trouble cutting food.  She has no vocal tremor, head tremor or RUE tremor.  She has no leg tremor.  Sometimes she feels like she is "walking like a drunk" because she is not stable.  She does not feel comfortable near the edge of a pier b/c she feels like she would fall off the edge.  She generally does not fall.  She did fall Jul 16, 2011 because she missed the last 3 steps at her home when she was coming down in a hurry.  She bruised her entire R leg and it is still very sore.    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  I do not have those records.  She saw Dr. [name].  She is on propranolol and she has been on it 7-8 years.  She is on 40 mg twice per day.  It does help.  She states that she was told that her propranolol could not be increased.  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  She sees a Publishing rights manager at [facility] and was told it was nothing to worry about.  She has it monitored q 3 years.  Her last MRI brain was done in [place].   Because of this, she will have intermittent numbness of the L face.    There was a fam hx of tremor in her sister and it is starting in her son, who is 69 years old.  Current/Previously tried tremor medications: propranolol, primidone  Current medications that may exacerbate tremor:  N/a  12/03/12:  She was started on primidone last visit.  She is only taking 1/2 of a 50 mg tablet at night.  She didn't go up to a full tablet because she did not like the way that it made her feel.  She wants to d/c the medication.  She  is on propranolol but the dose cannot be increased because her pulse is already on the low end of normal.  She is having some intermittent paresthesias in her fingertips, right more than left.  03/12/13:  Topamax was added last visit, which has helped but has decreased appetite.  She tremors much less now but notices it when stressed, fatigued or under pressure.  She c/o constipation with the topamax.  She has taste aversion with the medication.  She has some paresthesias with the medication.  She remains on propranolol but is off of the primidone.  She thinks that the SE are worth continuing to take the medication.  She has had worsening of vision but thinks that she had that prior to the topamax and has an eye appt Nov 4.  She worked on last visit.  B12 was reported.  Serum protein electrophoresis did not reveal M spike.  RPR was negative.  Urinary protein  electrophoresis showed some free kappa light chains but there was no monoclonal protein, no M spike.  RPR was negative.  07/28/13 update:  The pt was on topamax 200 mg daily.  She was unable to tolerate bid dosing of the medication.  She ended up going back to 100 mg at night and it makes her sleepy at night when she takes the medication but no hang over effect.  She still has some intermittent paresthesias.    I received a note from [facility] stating that there was no problem with the topamax and that she had old and stable hypertensive retinopathy but no corneal edema.  She does c/o dull fronal headaches, daily for the last month.  She uses sinus rinse and it helps.  States that tremor has been stable.    02/03/14 update:  The patient has a history of essential tremor, and is currently on propranolol 40 mg once a day as well as Topamax 100 mg daily.   Unless she is very tired or upset, she does well in regards to tremor.   She received notes from [name] at [facility] since last visit.  He just said that her schwannoma was stable in size and she just needs a repeat MRI for 10 years that they will plan on doing.  She did have a repeat urinary protein electrophoresis since our last visit that was unremarkable.  I had the opportunity to review notes from her primary care physician from 01/20/2014.  There were 2 brief episodes that the patient describes in which she was walking and her legs felt weak.  She felt near syncopal.  She was not dizzy but just stated that "I couldn't have moved if I had to."    It only lasted for a few minutes.  She had already decreased the propranolol on her own when these episodes happened.  She did that primarily because she would forget to take it bid but also because of fatigue.    08/04/14 update:  The patient returns today for follow-up.  Much has happened since her last visit.  Medical records were reviewed.  She has a history of essential tremor and was on propranolol  40 mg, but that has since been reduced to what medical records said was 10 mg daily but pt states is 5 mg daily because of other medical issues.  She remains on Topamax 100 mg daily.  In November, 2015, she went to her primary care physician complaining about malaise and she had a nuclear stress test that was  a low risk study with a left ventricular ejection fraction of 83%.  She saw cardiology not long thereafter and ended up having a chest x-ray demonstrating bibasilar infiltrates.  A few days later she developed a fever and was treated for community-acquired pneumonia.  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  During that hospitalization, her propranolol was decreased from 40 mg daily to 10 mg daily because of hypotension.  She became more weak during that hospitalization and was discharged to a subacute rehabilitation facility and ultimately left that rehabilitation facility and was discharged home on December 23.  She does think that tremor may have increased slightly.  08/30/14 update:  The patient is following up today at the request of her ophthalmologist.  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  There was an enhancing lesion in the right parietal region as well.  I talked on the phone with the radiologist about this lesion and he felt that this was likely a subacute vascular insult and not a metastatic lesion.  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  She had a carotid ultrasound on 07/14/2014 demonstrating 1-39% stenosis  bilaterally.  She did an echocardiogram last year when she was in the hospital on 04/20/2014 demonstrating an ejection fraction of 65-70%, moderate diastolic dysfunction and moderate pulmonary HTN  Outside reports reviewed: historical medical records.  Allergies  Allergen Reactions  . Other Other (See Comments)    Pulls skin off.......Marland Kitchenpaper tape  . Latex Itching, Rash and Other (See Comments)    Red angry skin from latex tape  . Sulfonamide Derivatives Rash    hives  . Zocor [Simvastatin] Nausea Only    Current Outpatient Prescriptions on File Prior to Visit  Medication Sig Dispense Refill  . acetaminophen (TYLENOL) 325 MG tablet Take 650 mg by mouth every 6 (six) hours as needed for headache.    [name] atorvastatin (LIPITOR) 20 MG tablet Take 1 tablet (20 mg total) by mouth daily. 30 tablet 5  . cetirizine (ZYRTEC) 10 MG tablet Take 10 mg by mouth daily. Seasonally:  As needed    . clidinium-chlordiazePOXIDE (LIBRAX) 5-2.5 MG per capsule Take 1 capsule by mouth 2 (two) times daily as needed. 60 capsule 3  . clopidogrel (PLAVIX) 75 MG tablet Take 1 tablet (75 mg total) by mouth daily. 30 tablet 5  . furosemide (LASIX) 20 MG tablet Take 1 tablet (20 mg total) by mouth daily. 30 tablet 5  . losartan (COZAAR) 100 MG tablet Take 1 tablet (100 mg total) by mouth daily. 30 tablet 11  . pantoprazole (PROTONIX) 40 MG tablet TAKE ONE TABLET BY MOUTH ONCE DAILY 30 tablet 5  . propranolol (INDERAL) 10 MG tablet Take 1 tablet (10 mg total) by mouth at bedtime. 30 tablet 0  . topiramate (TOPAMAX) 50 MG tablet TAKE TWO TABLETS BY MOUTH AT BEDTIME. 60 tablet 5  . triamcinolone cream (KENALOG) 0.1 % daily.  0   No current facility-administered medications on file prior to visit.  Past Medical History  Diagnosis Date  . Arthritis   . Asthma   . Hypertension   . GERD (gastroesophageal reflux disease)   . Hyperlipidemia   . Diverticulosis   . IBS (irritable bowel syndrome)   . HOH (hard of  hearing) left ear  . Acoustic neuroma   . Pneumonia     Dec. 2015  . Ejection fraction     Past Surgical History  Procedure Laterality Date  . Cholecystectomy  2011  . Abdominal hysterectomy  1980  . Tonsillectomy  1945  . Biopsy breast    . Eyes  2007    cararacts  . Tee without cardioversion N/A 09/05/2014    Procedure: TRANSESOPHAGEAL ECHOCARDIOGRAM (TEE);  Surgeon: Lelon Perla, MD;  Location: [facility] ENDOSCOPY;  Service: Cardiovascular;  Laterality: N/A;    Social History   Social History  . Marital Status: Widowed    Spouse Name: N/A  . Number of Children: N/A  . Years of Education: N/A   Occupational History  . Not on file.   Social History Main Topics  . Smoking status: Never Smoker   . Smokeless tobacco: Never Used  . Alcohol Use: 0.0 oz/week    0 Standard drinks or equivalent per week     Comment: wine about 3 times a week  . Drug Use: No  . Sexual Activity:    Partners: Female   Other Topics Concern  . Not on file   Social History Narrative    Family Status  Relation Status Death Age  . Mother Deceased     acute leukemia  . Father Deceased     MI, CVA  . Sister Alive     tremor  . Child Alive     son, mild tremor    Review of Systems A complete 10 system ROS was obtained and was negative apart from what is mentioned.   Objective:   VITALS:   Filed Vitals:   02/06/15 1246  BP: 140/80  Pulse: 84  Height: 5\' 1"  (1.549 m)  Weight: 117 lb 3 oz (53.156 kg)  SpO2: 96%   Wt Readings from Last 3 Encounters:  02/06/15 117 lb 3 oz (53.156 kg)  02/01/15 118 lb (53.524 kg)  01/04/15 119 lb (53.978 kg)   Gen:  Appears stated age and in NAD. HEENT:  Normocephalic, atraumatic. The mucous membranes are moist. The superficial temporal arteries are without ropiness or tenderness. Cardiovascular: Regular rate and rhythm. Lungs: Clear to auscultation bilaterally. Neck: There are no carotid bruits noted bilaterally.  NEUROLOGICAL:  Orientation:   The patient is alert and oriented x 3.   Cranial nerves: There is good facial symmetry.  There is pseudoptosis from lid lag bilaterally.    Extraocular muscles are intact and visual fields are full to confrontational testing. Speech is fluent and clear. Soft palate rises symmetrically and there is no tongue deviation. Hearing is intact to conversational tone. Tone: Tone is good throughout.  There is no rigidity. Sensation: Sensation is intact to light touch throughout Coordination:  The patient has no dysdiadichokinesia or dysmetria.  No asymmetries. Motor: Strength is 5/5 in the bilateral upper and lower extremities.  Shoulder shrug is equal bilaterally.  There is no pronator drift.  There are no fasciculations noted. DTR's: Deep tendon reflexes are 1/4 at the bilateral biceps, triceps, brachioradialis, left patella (did not test at the right patella because of pain) and absent at the bilateral achilles.  Plantar responses are downgoing bilaterally. Gait  and Station: The patient is able to ambulate without difficulty.  MOVEMENT EXAM: Tremor:  There is mild tremor of L hand, most obvious when trying to draw archimedes spirals.    It was also evident when pouring water from one glass to another.   LABS  Lab Results  Component Value Date   WBC 5.2 01/04/2015   HGB 14.7 01/04/2015   HCT 44.0 01/04/2015   MCV 86.9 01/04/2015   PLT 334.0 01/04/2015     Chemistry      Component Value Date/Time   NA 140 01/04/2015 1029   NA 139 05/03/2014 0630   K 4.3 01/04/2015 1029   K 3.2* 05/03/2014 0630   CL 105 01/04/2015 1029   CL 103 05/03/2014 0630   CO2 28 01/04/2015 1029   CO2 33* 05/03/2014 0630   BUN 14 01/04/2015 1029   BUN 17 05/03/2014 0630   CREATININE 0.81 01/04/2015 1029   CREATININE 0.82 05/03/2014 0630      Component Value Date/Time   CALCIUM 9.7 01/04/2015 1029   CALCIUM 8.4* 05/03/2014 0630   ALKPHOS 64 01/04/2015 1029   AST 18 01/04/2015 1029   ALT 13 01/04/2015 1029    BILITOT 0.3 01/04/2015 1029     Lab Results  Component Value Date   TSH 2.79 01/04/2015      Assessment/Plan:   1.  Essential Tremor.  -Pt is on propranolol at reduced dose of 10 mg.  May need a higher dose again as we get off the topamax.  BP/pulse could tolerate and she reports that she is currently splitting a 40 mg tablet in 1/4.  -We talked about the topamax.  I am going to d/c it because of weight loss and some memory concerns (seemed a little more confused today) 2.  Mild gait instability.  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  This seems overall very mild.  We talked about the diagnosis.  -w/u for reversible causes was negative  -We talked about safety associated with peripheral neuropathy. 3.  Acoustic neuroma.  -This is being followed by ENT and neurosurgery at [facility].  Very small  4.   left parietal infarction, chronic.  -She is to go home and make sure that she is taking  Plavix (she couldn't remember).  Stroke signs and symptoms discussed.  If she has any of these in the future she is to call 911.  Risks, benefits, side effects and alternative therapies were discussed.  The opportunity to ask questions was given and they were answered to the best of my ability.  The patient expressed understanding and willingness to follow the outlined treatment protocols.  -work up negative for thrombus 5.  F/u in next few months, sooner should new issues arise.  Much greater than 50% of this visit was spent in counseling with the patient and the family.  Total face to face time:  25 min

## 2015-02-27 ENCOUNTER — Telehealth: Payer: Self-pay | Admitting: Family Medicine

## 2015-02-27 DIAGNOSIS — E538 Deficiency of other specified B group vitamins: Secondary | ICD-10-CM

## 2015-02-27 NOTE — Telephone Encounter (Signed)
Pt would like to know how often she should have her vit b injections and when to schedule next one

## 2015-02-28 NOTE — Telephone Encounter (Signed)
I don't see any B12 in her list of medication. Please advise

## 2015-03-01 MED ORDER — CYANOCOBALAMIN 1000 MCG/ML IJ SOLN
1000.0000 ug | INTRAMUSCULAR | Status: DC
Start: 2015-03-01 — End: 2015-05-16

## 2015-03-01 NOTE — Telephone Encounter (Signed)
Rx for vitamin B12 was sent to the Pharmacy. Also B12 lab order was place for future lab .called pt and left a message.

## 2015-03-01 NOTE — Telephone Encounter (Signed)
She should be getting B12 1,000 mcg IM ONCE monthly.  She needs follow up B12 level in about 2 months.

## 2015-03-14 ENCOUNTER — Ambulatory Visit (INDEPENDENT_AMBULATORY_CARE_PROVIDER_SITE_OTHER): Payer: Medicare Other | Admitting: Family Medicine

## 2015-03-14 ENCOUNTER — Ambulatory Visit (INDEPENDENT_AMBULATORY_CARE_PROVIDER_SITE_OTHER)
Admission: RE | Admit: 2015-03-14 | Discharge: 2015-03-14 | Disposition: A | Discharge status: Home / Self Care | Payer: Medicare Other | Source: Ambulatory Visit | Attending: Family Medicine | Admitting: Family Medicine

## 2015-03-14 ENCOUNTER — Encounter: Payer: Self-pay | Admitting: Family Medicine

## 2015-03-14 VITALS — BP 142/90 | Temp 97.4°F | Wt 119.1 lb

## 2015-03-14 DIAGNOSIS — L989 Disorder of the skin and subcutaneous tissue, unspecified: Secondary | ICD-10-CM

## 2015-03-14 DIAGNOSIS — R0781 Pleurodynia: Secondary | ICD-10-CM

## 2015-03-14 DIAGNOSIS — R0789 Other chest pain: Secondary | ICD-10-CM

## 2015-03-14 DIAGNOSIS — Z23 Encounter for immunization: Secondary | ICD-10-CM | POA: Diagnosis not present

## 2015-03-14 DIAGNOSIS — E538 Deficiency of other specified B group vitamins: Secondary | ICD-10-CM | POA: Diagnosis not present

## 2015-03-14 MED ORDER — CYANOCOBALAMIN 1000 MCG/ML IJ SOLN
1000.0000 ug | Freq: Once | INTRAMUSCULAR | Status: AC
  Administered 2015-03-14: 1000 ug via INTRAMUSCULAR

## 2015-03-14 MED ORDER — MUPIROCIN 2 % EX OINT: 1.0000 "application " | TOPICAL_OINTMENT | Freq: Two times a day (BID) | CUTANEOUS | Status: DC

## 2015-03-14 NOTE — Addendum Note (Signed)
Addended by: Ailene Rud E on: 03/14/2015 01:39 PM   Modules accepted: Orders

## 2015-03-14 NOTE — Progress Notes (Signed)
Subjective:    Patient ID: Carmen Duncan, female    DOB: Jun 14, 1940, 74 y.o.   MRN: [id number]  HPI  Patient seen for the following issues  Right anterior and lateral rib cage pain. Noted about a month ago. No injury. She notices soreness to touch and sometimes with movement. No bruising. No skin rash. No recent cough. No dyspnea. She describes a dull achy pain at its worst 8 out of 10 severity but comes and goes. No dyspnea. Tylenol helps. She had high-resolution CT chest back in May with no acute findings.  History of B12 deficiency. This was recently diagnosed. She started off on B12 replacement but has not had an injection now apparently in several weeks. She did feel more energy when she was getting injections  Crusted area right naris. No history of MRSA. She had a similar crusted yellow lesion left naris several months ago  Past Medical History  Diagnosis Date  . Arthritis   . Asthma   . Hypertension   . GERD (gastroesophageal reflux disease)   . Hyperlipidemia   . Diverticulosis   . IBS (irritable bowel syndrome)   . HOH (hard of hearing) left ear  . Acoustic neuroma ([place])   . Pneumonia     Dec. 2015  . Ejection fraction    Past Surgical History  Procedure Laterality Date  . Cholecystectomy  2011  . Abdominal hysterectomy  1980  . Tonsillectomy  1945  . Biopsy breast    . Eyes  2007    cararacts  . Tee without cardioversion N/A 09/05/2014    Procedure: TRANSESOPHAGEAL ECHOCARDIOGRAM (TEE);  Surgeon: Lelon Perla, MD;  Location: [facility] ENDOSCOPY;  Service: Cardiovascular;  Laterality: N/A;    reports that she has never smoked. She has never used smokeless tobacco. She reports that she drinks alcohol. She reports that she does not use illicit drugs. family history includes Heart disease (age of onset: 10) in her father; Hyperlipidemia in her father and mother; Hypertension in her mother; Stroke in her father. Allergies  Allergen Reactions  . Other Other (See  Comments)    Pulls skin off.......Marland Kitchenpaper tape  . Latex Itching, Rash and Other (See Comments)    Red angry skin from latex tape  . Sulfonamide Derivatives Rash    hives  . Zocor [Simvastatin] Nausea Only     Review of Systems  Constitutional: Negative for fever, chills and unexpected weight change.  Respiratory: Negative for cough and shortness of breath.   Cardiovascular: Negative for chest pain.  Gastrointestinal: Negative for nausea, vomiting and abdominal pain.  Genitourinary: Negative for dysuria.  Neurological: Negative for dizziness.       Objective:   Physical Exam  Constitutional: She appears well-developed and well-nourished.  HENT:  Patient has yellow crusted area-up proximally 1/2 cm- at the opening of the right naris. No surrounding cellulitis changes  Neck: Neck supple. No thyromegaly present.  Cardiovascular: Normal rate and regular rhythm.   Pulmonary/Chest: Effort normal. She has no wheezes.  She has some faint crackles in both lung bases which are chronic  She has some tenderness in the right lower anterior rib cage area. No overlying skin changes  Abdominal: Soft. She exhibits no mass. There is no tenderness. There is no rebound and no guarding.  Musculoskeletal: She exhibits no edema.          Assessment & Plan:  #1 right anterior rib cage pain. This is sore to touch and worse with movement which  suggests musculoskeletal origin. Obtain right anterior rib films given duration #2 B12 deficiency. Get back on cycle with monthly injections 1000 g and recheck B12 level in about 3 months #3 crusted lesion right naris. Question staph. No cellulitis changes. Bactroban ointment twice daily for 5 days

## 2015-03-14 NOTE — Progress Notes (Signed)
Pre visit review using our clinic review tool, if applicable. No additional management support is needed unless otherwise documented below in the visit note. 

## 2015-03-30 NOTE — Telephone Encounter (Addendum)
Pt has been sch for blood work. Pt will pick up vit b12 and callback to sch an injection

## 2015-04-03 ENCOUNTER — Ambulatory Visit (INDEPENDENT_AMBULATORY_CARE_PROVIDER_SITE_OTHER): Payer: Medicare Other | Admitting: Family Medicine

## 2015-04-03 DIAGNOSIS — E538 Deficiency of other specified B group vitamins: Secondary | ICD-10-CM

## 2015-04-03 MED ORDER — CYANOCOBALAMIN 1000 MCG/ML IJ SOLN
1000.0000 ug | Freq: Once | INTRAMUSCULAR | Status: AC
  Administered 2015-04-03: 1000 ug via INTRAMUSCULAR

## 2015-04-05 ENCOUNTER — Ambulatory Visit: Payer: Medicare Other | Admitting: Family Medicine

## 2015-04-27 ENCOUNTER — Ambulatory Visit: Payer: Medicare Other | Admitting: Family Medicine

## 2015-04-27 DIAGNOSIS — E538 Deficiency of other specified B group vitamins: Secondary | ICD-10-CM

## 2015-04-27 MED ORDER — CYANOCOBALAMIN 1000 MCG/ML IJ SOLN
1000.0000 ug | Freq: Once | INTRAMUSCULAR | Status: AC
  Administered 2015-04-27: 1000 ug via INTRAMUSCULAR

## 2015-05-03 ENCOUNTER — Encounter: Payer: Self-pay | Admitting: Pulmonary Disease

## 2015-05-03 ENCOUNTER — Ambulatory Visit (INDEPENDENT_AMBULATORY_CARE_PROVIDER_SITE_OTHER): Payer: Medicare Other | Admitting: Pulmonary Disease

## 2015-05-03 VITALS — BP 142/78 | HR 90 | Ht 61.0 in | Wt 120.6 lb

## 2015-05-03 DIAGNOSIS — J8410 Pulmonary fibrosis, unspecified: Secondary | ICD-10-CM | POA: Diagnosis not present

## 2015-05-03 DIAGNOSIS — J84112 Idiopathic pulmonary fibrosis: Secondary | ICD-10-CM

## 2015-05-03 NOTE — Progress Notes (Signed)
   Subjective:    Patient ID: Carmen Duncan, female    DOB: 1941/01/31, 74 y.o.   MRN: [id number]  HPI  74yo never smoker with asthma, HTNfor follow-up of interstitial lung disease  Her son [name] works for [employer] 03/2014 Seen for pulmonary consult during hospitalization for CAP for non-resolving infiltrates.   She presented with dyspnea on exertion for several months with occasional dry cough, acute fevers with sputum production for the 2 weeks, treated with Levaquin 10 days as an outpatient by PCP, neg cardiac evaluation by Dr. Ron Parker , history of Raynaud phenomenon exam findings of bilateral coarse crackles at bases, no pedal edema or JVD & labs showing hyponatremia (chronic), absence of leukocytosis.  She was treated with IV steroids and discharged on a slow prednisone taper x 4 months and finally tapered by end of [facility] 2016   05/03/2015  Chief Complaint  Patient presents with  . Follow-up    Lot of drainage in the morning; breathing doing well. Discuss longevity of illness.      Breathing appears stable, no cough or wheezing She reports drainage in the morning that seems to improve as the day goes by.   Desaturates to 91% on walking but recovers on resting  Significant tests/ events  CXR reviewed- bibasal infiltrates seem to have improved from 04/06/14, these are new when compared to old chest x-ray from 2011. 03/2014 HRCT scan -showed peripheral patchy consolidation bilaterally-infection versus COP  Swallow eval neg for aspiration  03/2014 echo >> grade 2 diastolic dysfunction with fluid overload and elevated BNP  09/2014 HRCT chest - resolution of aspdz  Peripheral and basilar predominant pattern of subpleural reticulation, ground-glass, traction bronchiolectasis   Labs 08/2014  showed ESR at 23, Hypsensiivity panel neg ,  ANA positive but titer neg , CCP neg, hypersens panel neg .   PFT 09/2014 showed mild to mod restriction FVC at 60%,  FEV1 at 64%, ratio 79,   with low DLCO at 50%.    Review of Systems neg for any significant sore throat, dysphagia, itching, sneezing, nasal congestion or excess/ purulent secretions, fever, chills, sweats, unintended wt loss, pleuritic or exertional cp, hempoptysis, orthopnea pnd or change in chronic leg swelling. Also denies presyncope, palpitations, heartburn, abdominal pain, nausea, vomiting, diarrhea or change in bowel or urinary habits, dysuria,hematuria, rash, arthralgias, visual complaints, headache, numbness weakness or ataxia.     Objective:   Physical Exam  Gen. Pleasant, well-nourished, in no distress ENT - no lesions, no post nasal drip Neck: No JVD, no thyromegaly, no carotid bruits Lungs: no use of accessory muscles, no dullness to percussion, bibasal rales , no rhonchi  Cardiovascular: Rhythm regular, heart sounds  normal, no murmurs or gallops, no peripheral edema Musculoskeletal: No deformities, no cyanosis or clubbing        Assessment & Plan:

## 2015-05-03 NOTE — Patient Instructions (Signed)
You have pulmonary fibrosis - this does not appear to be progressive at present Repeat CT scan of the chest in April 2016 Call us if breathing worse

## 2015-05-04 ENCOUNTER — Other Ambulatory Visit: Payer: Self-pay | Admitting: Family Medicine

## 2015-05-04 NOTE — Assessment & Plan Note (Signed)
Her last scan has been suggestive of idiopathic pulmonary fibrosis. I would favor this diagnosis-seems like the air space disease that she had 03/2014 and presentation had resolved. The only atypical feature here is the apparent response to steroids.  We will repeat an interim CT scan and PFTs in 08/2014-if there is deterioration, would consider anti-fibrotic therapy. Doubt that we need a biopsy here

## 2015-05-11 ENCOUNTER — Other Ambulatory Visit: Payer: Medicare Other

## 2015-05-15 ENCOUNTER — Other Ambulatory Visit: Payer: Self-pay | Admitting: Family Medicine

## 2015-05-15 ENCOUNTER — Telehealth: Payer: Self-pay | Admitting: Family Medicine

## 2015-05-15 NOTE — Telephone Encounter (Signed)
Pt request refill of the following: clidinium-chlordiazePOXIDE (LIBRAX) 5-2.5 MG per capsule   Phamacy:  [facility]

## 2015-05-16 ENCOUNTER — Other Ambulatory Visit: Payer: Medicare Other

## 2015-05-16 ENCOUNTER — Telehealth: Payer: Self-pay | Admitting: Family Medicine

## 2015-05-16 DIAGNOSIS — E538 Deficiency of other specified B group vitamins: Secondary | ICD-10-CM

## 2015-05-16 MED ORDER — CILIDINIUM-CHLORDIAZEPOXIDE 2.5-5 MG PO CAPS: 1.0000 | ORAL_CAPSULE | Freq: Two times a day (BID) | ORAL | Status: DC | PRN

## 2015-05-16 MED ORDER — CYANOCOBALAMIN 1000 MCG/ML IJ SOLN: 1000.0000 ug | INTRAMUSCULAR | Status: DC

## 2015-05-16 NOTE — Telephone Encounter (Signed)
Faxed to pharmacy

## 2015-05-16 NOTE — Telephone Encounter (Signed)
Pt said she can not find the following  cyanocobalamin (,VITAMIN B-12,) 1000 MCG/ML injection. Said she has looked all over for the bottle and can not fine it. She is asking if another bottle can be called in    Phamracy;  [place]

## 2015-05-16 NOTE — Telephone Encounter (Signed)
Printed for signature

## 2015-05-16 NOTE — Telephone Encounter (Signed)
Refill sent to pharmacy for patient.

## 2015-05-16 NOTE — Telephone Encounter (Signed)
When should pt have vit b recheck?

## 2015-05-17 NOTE — Telephone Encounter (Signed)
Per office visit on 03/14/15 Dr. Elease Hashimoto instructed to get B12 levels checked in 3 months. That would put the patient getting levels checked anytime around January 18th. Called and made patient aware to schedule appointment around that time. Patient verbalized understanding and plans to schedule then.

## 2015-05-30 ENCOUNTER — Ambulatory Visit (INDEPENDENT_AMBULATORY_CARE_PROVIDER_SITE_OTHER): Payer: Medicare Other | Admitting: Family Medicine

## 2015-05-30 DIAGNOSIS — E538 Deficiency of other specified B group vitamins: Secondary | ICD-10-CM

## 2015-05-30 MED ORDER — CYANOCOBALAMIN 1000 MCG/ML IJ SOLN
1000.0000 ug | Freq: Once | INTRAMUSCULAR | Status: AC
  Administered 2015-05-30: 1000 ug via INTRAMUSCULAR

## 2015-06-07 ENCOUNTER — Ambulatory Visit (INDEPENDENT_AMBULATORY_CARE_PROVIDER_SITE_OTHER): Payer: Medicare Other | Admitting: Family Medicine

## 2015-06-07 ENCOUNTER — Encounter: Payer: Self-pay | Admitting: Family Medicine

## 2015-06-07 VITALS — BP 110/72 | HR 89 | Temp 97.3°F | Ht 61.0 in | Wt 120.4 lb

## 2015-06-07 DIAGNOSIS — E785 Hyperlipidemia, unspecified: Secondary | ICD-10-CM

## 2015-06-07 DIAGNOSIS — E538 Deficiency of other specified B group vitamins: Secondary | ICD-10-CM

## 2015-06-07 DIAGNOSIS — I10 Essential (primary) hypertension: Secondary | ICD-10-CM | POA: Diagnosis not present

## 2015-06-07 LAB — VITAMIN B12: VITAMIN B 12: 877 pg/mL (ref 211–911)

## 2015-06-07 NOTE — Progress Notes (Signed)
Pre visit review using our clinic review tool, if applicable. No additional management support is needed unless otherwise documented below in the visit note. 

## 2015-06-07 NOTE — Progress Notes (Signed)
   Subjective:    Patient ID: Carmen Duncan, female    DOB: July 09, 1940, 75 y.o.   MRN: [id number]  HPI Medical follow-up. She has history of idiopathic pulmonary fibrosis, B12 deficiency, diastolic heart failure, dyslipidemia, hypertension, essential tremor, pulmonary hypertension  She had some gradual weight loss and was taken off  Topamax.  Her weight has stabilized but she has unfortunately had some increased tremor-especially left upper extremity. She has follow-up with neurology soon  B12 deficiency. Most recent level 177.  She was not taking B12 consistently. She has been getting injections monthly over the past several months. Needs follow-up labs.  Hypertension treated with losartan. No dizziness. No headaches. No chest pains. Respiratory status stable  Past Medical History  Diagnosis Date  . Arthritis   . Asthma   . Hypertension   . GERD (gastroesophageal reflux disease)   . Hyperlipidemia   . Diverticulosis   . IBS (irritable bowel syndrome)   . HOH (hard of hearing) left ear  . Acoustic neuroma ([place])   . Pneumonia     Dec. 2015  . Ejection fraction    Past Surgical History  Procedure Laterality Date  . Cholecystectomy  2011  . Abdominal hysterectomy  1980  . Tonsillectomy  1945  . Biopsy breast    . Eyes  2007    cararacts  . Tee without cardioversion N/A 09/05/2014    Procedure: TRANSESOPHAGEAL ECHOCARDIOGRAM (TEE);  Surgeon: Lelon Perla, MD;  Location: [facility] ENDOSCOPY;  Service: Cardiovascular;  Laterality: N/A;    reports that she has never smoked. She has never used smokeless tobacco. She reports that she drinks alcohol. She reports that she does not use illicit drugs. family history includes Heart disease (age of onset: 23) in her father; Hyperlipidemia in her father and mother; Hypertension in her mother; Stroke in her father. Allergies  Allergen Reactions  . Other Other (See Comments)    Pulls skin off.......Marland Kitchenpaper tape  . Latex Itching, Rash and Other  (See Comments)    Red angry skin from latex tape  . Sulfonamide Derivatives Rash    hives  . Zocor [Simvastatin] Nausea Only      Review of Systems  Constitutional: Positive for fatigue. Negative for fever, chills, appetite change and unexpected weight change.  Eyes: Negative for visual disturbance.  Respiratory: Negative for cough, chest tightness, shortness of breath and wheezing.   Cardiovascular: Negative for chest pain, palpitations and leg swelling.  Neurological: Positive for tremors. Negative for dizziness, seizures, syncope, weakness, light-headedness and headaches.       Objective:   Physical Exam  Constitutional: She is oriented to person, place, and time. She appears well-developed and well-nourished.  Neck: Neck supple. No JVD present.  Cardiovascular: Normal rate and regular rhythm.   Pulmonary/Chest: Effort normal.  Crackles in both lung bases which is chronic  Musculoskeletal: She exhibits no edema.  Neurological: She is alert and oriented to person, place, and time. No cranial nerve deficit.          Assessment & Plan:  #1 B12 deficiency. Recheck B12 level. We discussed option of oral replacement if levels are back to normal but she is reluctant and prefers injection  #2 hypertension stable and at goal. Continue current medications  #3 history of dyslipidemia. Continue atorvastatin. She had lipids done last summer which are stable.

## 2015-06-08 ENCOUNTER — Encounter: Payer: Self-pay | Admitting: Neurology

## 2015-06-08 ENCOUNTER — Ambulatory Visit (INDEPENDENT_AMBULATORY_CARE_PROVIDER_SITE_OTHER): Payer: Medicare Other | Admitting: Neurology

## 2015-06-08 VITALS — BP 110/70 | HR 79 | Ht 61.0 in | Wt 120.0 lb

## 2015-06-08 DIAGNOSIS — E538 Deficiency of other specified B group vitamins: Secondary | ICD-10-CM | POA: Diagnosis not present

## 2015-06-08 DIAGNOSIS — G609 Hereditary and idiopathic neuropathy, unspecified: Secondary | ICD-10-CM

## 2015-06-08 DIAGNOSIS — G250 Essential tremor: Secondary | ICD-10-CM | POA: Diagnosis not present

## 2015-06-08 DIAGNOSIS — Z8673 Personal history of transient ischemic attack (TIA), and cerebral infarction without residual deficits: Secondary | ICD-10-CM

## 2015-06-08 MED ORDER — PROPRANOLOL HCL 20 MG PO TABS: 20.0000 mg | ORAL_TABLET | Freq: Every day | ORAL | Status: DC

## 2015-06-08 NOTE — Patient Instructions (Addendum)
1.  Increase propranolol to 20 mg daily  (use up your 10 mg if you have any left and then fill the 20 mg RX I sent to the pharmacy) 2.  Continue plavix 75 mg daily

## 2015-06-08 NOTE — Progress Notes (Signed)
Subjective:   Carmen Duncan was seen in consultation in the movement disorder clinic at the request of Carmen Post, MD.  The evaluation is for tremor.  The patient is a 75 y.o. R handed female with a long hx of tremor.   The patient states that the tremor is in the L hand.  She notes it with activation.  If pouring something from a saucepan, it will start to tremor.  It seems to be worse in specific positions.  She has no trouble with putting on makeup or eating as this only affects her nondominant hand.  She has no trouble cutting food.  She has no vocal tremor, head tremor or RUE tremor.  She has no leg tremor.  Sometimes she feels like she is "walking like a drunk" because she is not stable.  She does not feel comfortable near the edge of a pier b/c she feels like she would fall off the edge.  She generally does not fall.  She did fall Jul 16, 2011 because she missed the last 3 steps at her home when she was coming down in a hurry.  She bruised her entire R leg and it is still very sore.    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  I do not have those records.  She saw Dr. [name].  She is on propranolol and she has been on it 7-8 years.  She is on 40 mg twice per day.  It does help.  She states that she was told that her propranolol could not be increased.  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  She sees a Publishing rights manager at [facility] and was told it was nothing to worry about.  She has it monitored q 3 years.  Her last MRI brain was done in [place].   Because of this, she will have intermittent numbness of the L face.    There was a fam hx of tremor in her sister and it is starting in her son, who is 63 years old.  Current/Previously tried tremor medications: propranolol, primidone  Current medications that may exacerbate tremor:  N/a  12/03/12:  She was started on primidone last visit.  She is only taking 1/2 of  a 50 mg tablet at night.  She didn't go up to a full tablet because she did not like the way that it made her feel.  She wants to d/c the medication.  She is on propranolol but the dose cannot be increased because her pulse is already on the low end of normal.  She is having some intermittent paresthesias in her fingertips, right more than left.  03/12/13:  Topamax was added last visit, which has helped but has decreased appetite.  She tremors much less now but notices it when stressed, fatigued or under pressure.  She c/o constipation with the topamax.  She has taste aversion with the medication.  She has some paresthesias with the medication.  She remains on propranolol but is off of the primidone.  She thinks that the SE are worth continuing to take the medication.  She has had worsening of vision but thinks that she had that prior to the topamax and has an eye appt Nov 4.  She worked on last visit.  B12 was reported.  Serum protein electrophoresis did not reveal M spike.  RPR was negative.  Urinary protein electrophoresis  showed some free kappa light chains but there was no monoclonal protein, no M spike.  RPR was negative.  07/28/13 update:  The pt was on topamax 200 mg daily.  She was unable to tolerate bid dosing of the medication.  She ended up going back to 100 mg at night and it makes her sleepy at night when she takes the medication but no hang over effect.  She still has some intermittent paresthesias.    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  She does c/o dull fronal headaches, daily for the last month.  She uses sinus rinse and it helps.  States that tremor has been stable.    02/03/14 update:  The patient has a history of essential tremor, and is currently on propranolol 40 mg once a day as well as Topamax 100 mg daily.   Unless she is very tired or upset, she  does well in regards to tremor.   She received notes from [name] at [facility] since last visit.  He just said that her schwannoma was stable in size and she just needs a repeat MRI for 10 years that they will plan on doing.  She did have a repeat urinary protein electrophoresis since our last visit that was unremarkable.  I had the opportunity to review notes from her primary care physician from 01/20/2014.  There were 2 brief episodes that the patient describes in which she was walking and her legs felt weak.  She felt near syncopal.  She was not dizzy but just stated that "I couldn't have moved if I had to."    It only lasted for a few minutes.  She had already decreased the propranolol on her own when these episodes happened.  She did that primarily because she would forget to take it bid but also because of fatigue.    08/04/14 update:  The patient returns today for follow-up.  Much has happened since her last visit.  Medical records were reviewed.  She has a history of essential tremor and was on propranolol 40 mg, but that has since been reduced to what medical records said was 10 mg daily but pt states is 5 mg daily because of other medical issues.  She remains on Topamax 100 mg daily.  In November, 2015, she went to her primary care physician complaining about malaise and she had a nuclear stress test that was a low risk study with a left ventricular ejection fraction of 83%.  She saw cardiology not long thereafter and ended up having a chest x-ray demonstrating bibasilar infiltrates.  A few days later she developed a fever and was treated for community-acquired pneumonia.  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  During that hospitalization, her propranolol was decreased from 40 mg daily to 10 mg daily because of hypotension.  She became more weak during that hospitalization and was discharged to a subacute rehabilitation facility and ultimately left that rehabilitation  facility and was discharged home on December 23.  She does think that tremor may have increased slightly.  08/30/14 update:  The patient is following up today at the request of her ophthalmologist.  Pt states that she just went for a "normal checkup" because her vision had changed a little and "I needed new frames."  She states that her eye doctor thought that  there was a stroke behind the left eye and that led to an MRI of the brain and MRA of the brain on 08/23/2014.  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  There was an enhancing lesion in the right parietal region as well.  I talked on the phone with the radiologist about this lesion and he felt that this was likely a subacute vascular insult and not a metastatic lesion.  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  She had a carotid ultrasound on 07/14/2014 demonstrating 1-39% stenosis bilaterally.  She did an echocardiogram last year when she was in the hospital on 04/20/2014 demonstrating an ejection fraction of 65-70%, moderate diastolic dysfunction and moderate pulmonary HTN  02/06/15 update:  The patient returns today for follow-up.  She is on Topamax, 100 mg daily and propranolol, 10 mg daily for essential tremor.  At our last visit, I had the patient discontinue her aspirin and we changed it to Plavix.  She is doing well on that.  No melena/hematochezia.  No falls.  She had a TEE that was negative for thrombus and demonstrated normal LV function.  She was supposed to wear an event monitor, and she did, but compliance was very limited and she only wore it for part of a few days, so there was limited data.  There was no arrhythmia when she did wear it.  She had a repeat MRI of the brain on 12/16/2014.  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  On prior MRAs this is consistent with atherosclerotic changes.   I have reviewed primary care records since our last visit.  She has been losing weight over the last several months and her primary care physician asked me if it was from the Topamax.  She has had a fairly extensive negative workup.  Although I doubt it given the amount of time that she has been on Topamax, I told him that she could discontinue it.  They opted to leave her on it for now and take a wait and see approach.  She states that she has no appetite but she is forcing herself to eat.    06/08/15 update:  The patient is following up today for her essential tremor.  Last visit I discontinued her Topamax because of weight loss and memory concerns.  She reports that she does well until she gets really tired and then she will have tremor of the L hand and have trouble pouring things.  She remains on propranolol, 10 mg daily for tremor.  I reviewed records since our last visit.  Her B12 level was very low at 177 and the patient admits she was not being compliant with taking her B12.  She is now on monthly injections.  Her B12 was rechecked yesterday and it was 877.  She is doing well on her Plavix and has had no new focal or lateralizing neuro sx's.  No bleeding.    Outside reports reviewed: historical medical records.  Allergies  Allergen Reactions  . Other Other (See Comments)    Pulls skin off.......Marland Kitchenpaper tape  . Latex Itching, Rash and Other (See Comments)    Red angry skin from latex  tape  . Sulfonamide Derivatives Rash    hives  . Zocor [Simvastatin] Nausea Only    Current Outpatient Prescriptions on File Prior to Visit  Medication Sig Dispense Refill  . acetaminophen (TYLENOL) 325 MG tablet Take 650 mg by mouth every 6 (six) hours as needed for headache.    [name] atorvastatin (LIPITOR) 20 MG tablet Take 1 tablet (20 mg  total) by mouth daily. 30 tablet 5  . cetirizine (ZYRTEC) 10 MG tablet Take 10 mg by mouth daily. Seasonally:  As needed    . clidinium-chlordiazePOXIDE (LIBRAX) 5-2.5 MG capsule Take 1 capsule by mouth 2 (two) times daily as needed. 60 capsule 3  . cyanocobalamin (,VITAMIN B-12,) 1000 MCG/ML injection Inject 1 mL (1,000 mcg total) into the muscle every 30 (thirty) days. 30 mL 0  . losartan (COZAAR) 100 MG tablet Take 1 tablet (100 mg total) by mouth daily. 30 tablet 11  . mupirocin ointment (BACTROBAN) 2 % Place 1 application into the nose 2 (two) times daily. 22 g 0  . pantoprazole (PROTONIX) 40 MG tablet TAKE ONE TABLET BY MOUTH ONCE DAILY 30 tablet 5  . propranolol (INDERAL) 10 MG tablet TAKE ONE TABLET BY MOUTH AT BEDTIME 30 tablet 11  . triamcinolone cream (KENALOG) 0.1 % daily.  0  . clopidogrel (PLAVIX) 75 MG tablet Take 1 tablet (75 mg total) by mouth daily. (Patient not taking: Reported on 06/08/2015) 30 tablet 5   No current facility-administered medications on file prior to visit.    Past Medical History  Diagnosis Date  . Arthritis   . Asthma   . Hypertension   . GERD (gastroesophageal reflux disease)   . Hyperlipidemia   . Diverticulosis   . IBS (irritable bowel syndrome)   . HOH (hard of hearing) left ear  . Acoustic neuroma ([place])   . Pneumonia     Dec. 2015  . Ejection fraction     Past Surgical History  Procedure Laterality Date  . Cholecystectomy  2011  . Abdominal hysterectomy  1980  . Tonsillectomy  1945  . Biopsy breast    . Eyes  2007    cararacts  . Tee without cardioversion N/A 09/05/2014    Procedure: TRANSESOPHAGEAL ECHOCARDIOGRAM (TEE);  Surgeon: Lelon Perla, MD;  Location: [facility] ENDOSCOPY;  Service: Cardiovascular;  Laterality: N/A;    Social History   Social History  . Marital Status: Widowed    Spouse Name: N/A  . Number of Children: N/A  . Years of Education: N/A   Occupational History  . Not on file.   Social History Main Topics  .  Smoking status: Never Smoker   . Smokeless tobacco: Never Used  . Alcohol Use: 0.0 oz/week    0 Standard drinks or equivalent per week     Comment: wine about 3 times a week  . Drug Use: No  . Sexual Activity:    Partners: Female   Other Topics Concern  . Not on file   Social History Narrative    Family Status  Relation Status Death Age  . Mother Deceased     acute leukemia  . Father Deceased     MI, CVA  . Sister Alive     tremor  . Child Alive     son, mild tremor    Review of Systems A complete 10 system ROS was obtained and was negative apart from what is mentioned.   Objective:   VITALS:   Filed Vitals:   06/08/15  1300  BP: 110/70  Pulse: 79  Height: 5\' 1"  (1.549 m)  Weight: 120 lb (54.432 kg)   Wt Readings from Last 3 Encounters:  06/08/15 120 lb (54.432 kg)  06/07/15 120 lb 6.4 oz (54.613 kg)  05/03/15 120 lb 9.6 oz (54.704 kg)   Gen:  Appears stated age and in NAD. HEENT:  Normocephalic, atraumatic. The mucous membranes are moist. The superficial temporal arteries are without ropiness or tenderness. Cardiovascular: Regular rate and rhythm. Lungs: Clear to auscultation bilaterally. Neck: There are no carotid bruits noted bilaterally.  NEUROLOGICAL:  Orientation:  The patient is alert and oriented x 3.  MMSE performed today (06/08/15) and she scored 28/30 Cranial nerves: There is good facial symmetry.  There is pseudoptosis from lid lag bilaterally.    Extraocular muscles are intact and visual fields are full to confrontational testing. Speech is fluent and clear. Soft palate rises symmetrically and there is no tongue deviation. Hearing is intact to conversational tone. Tone: Tone is good throughout.  There is no rigidity. Sensation: Sensation is intact to light touch throughout Coordination:  The patient has no dysdiadichokinesia or dysmetria.  No asymmetries. Motor: Strength is 5/5 in the bilateral upper and lower extremities.  Shoulder shrug is equal  bilaterally.  There is no pronator drift.  There are no fasciculations noted. DTR's: Deep tendon reflexes are 1/4 at the bilateral biceps, triceps, brachioradialis, left patella (did not test at the right patella because of pain) and absent at the bilateral achilles.  Plantar responses are downgoing bilaterally. Gait and Station: The patient is able to ambulate without difficulty.   MOVEMENT EXAM: Tremor:  There is mild tremor of L hand.  Becomes more evident when elbow bent and when given something of weight to hold.   LABS  Lab Results  Component Value Date   WBC 5.2 01/04/2015   HGB 14.7 01/04/2015   HCT 44.0 01/04/2015   MCV 86.9 01/04/2015   PLT 334.0 01/04/2015     Chemistry      Component Value Date/Time   NA 140 01/04/2015 1029   NA 139 05/03/2014 0630   K 4.3 01/04/2015 1029   K 3.2* 05/03/2014 0630   CL 105 01/04/2015 1029   CL 103 05/03/2014 0630   CO2 28 01/04/2015 1029   CO2 33* 05/03/2014 0630   BUN 14 01/04/2015 1029   BUN 17 05/03/2014 0630   CREATININE 0.81 01/04/2015 1029   CREATININE 0.82 05/03/2014 0630      Component Value Date/Time   CALCIUM 9.7 01/04/2015 1029   CALCIUM 8.4* 05/03/2014 0630   ALKPHOS 64 01/04/2015 1029   AST 18 01/04/2015 1029   ALT 13 01/04/2015 1029   BILITOT 0.3 01/04/2015 1029     Lab Results  Component Value Date   TSH 2.79 01/04/2015      Assessment/Plan:   1.  Essential Tremor.  -Will cautiously increase propranolol to 20 mg daily (has been on 40 mg in the past).     -Off topamax and memory seems better in the office and MMSE very normal (pt not sure that better but not worse either) 2.  Mild gait instability.  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  This seems overall very mild.  We talked about the diagnosis.  -w/u for reversible causes was negative  -We talked about safety associated with peripheral neuropathy. 3.  Acoustic neuroma.  -This is being followed by ENT and neurosurgery at  [facility].  Very small  4.  Hx cerebral infarct  -On plavix and doing well.  No source thrombus ever found 5.  B12 deficiency  -now on injections 6.  F/u in next few months sooner should new neuro issues arise.  Much greater than 50% of this visit was spent in counseling with the patient.  Total face to face time:  25 min

## 2015-07-03 ENCOUNTER — Other Ambulatory Visit: Payer: Self-pay | Admitting: Family Medicine

## 2015-07-03 ENCOUNTER — Ambulatory Visit: Payer: Medicare Other | Admitting: Family Medicine

## 2015-07-03 MED ORDER — CYANOCOBALAMIN 500 MCG PO TABS: 500.0000 ug | ORAL_TABLET | Freq: Every day | ORAL | Status: DC

## 2015-07-04 ENCOUNTER — Other Ambulatory Visit: Payer: Self-pay | Admitting: Family Medicine

## 2015-07-06 DIAGNOSIS — R922 Inconclusive mammogram: Secondary | ICD-10-CM | POA: Diagnosis not present

## 2015-07-06 DIAGNOSIS — Z1231 Encounter for screening mammogram for malignant neoplasm of breast: Secondary | ICD-10-CM | POA: Diagnosis not present

## 2015-07-06 DIAGNOSIS — R928 Other abnormal and inconclusive findings on diagnostic imaging of breast: Secondary | ICD-10-CM | POA: Diagnosis not present

## 2015-07-06 LAB — HM MAMMOGRAPHY: HM MAMMO: NEGATIVE

## 2015-07-10 ENCOUNTER — Encounter: Payer: Self-pay | Admitting: Family Medicine

## 2015-07-13 DIAGNOSIS — R928 Other abnormal and inconclusive findings on diagnostic imaging of breast: Secondary | ICD-10-CM | POA: Diagnosis not present

## 2015-07-13 DIAGNOSIS — R922 Inconclusive mammogram: Secondary | ICD-10-CM | POA: Diagnosis not present

## 2015-07-14 ENCOUNTER — Encounter: Payer: Self-pay | Admitting: Family Medicine

## 2015-07-17 ENCOUNTER — Other Ambulatory Visit: Payer: Self-pay | Admitting: Radiology

## 2015-07-17 DIAGNOSIS — Z0000 Encounter for general adult medical examination without abnormal findings: Secondary | ICD-10-CM | POA: Diagnosis not present

## 2015-07-17 DIAGNOSIS — D0511 Intraductal carcinoma in situ of right breast: Secondary | ICD-10-CM | POA: Diagnosis not present

## 2015-07-17 DIAGNOSIS — R922 Inconclusive mammogram: Secondary | ICD-10-CM | POA: Diagnosis not present

## 2015-07-17 DIAGNOSIS — R920 Mammographic microcalcification found on diagnostic imaging of breast: Secondary | ICD-10-CM | POA: Diagnosis not present

## 2015-07-17 DIAGNOSIS — R928 Other abnormal and inconclusive findings on diagnostic imaging of breast: Secondary | ICD-10-CM | POA: Diagnosis not present

## 2015-07-17 LAB — HM MAMMOGRAPHY: HM Mammogram: NORMAL

## 2015-07-19 ENCOUNTER — Encounter: Payer: Self-pay | Admitting: *Deleted

## 2015-07-19 ENCOUNTER — Encounter: Payer: Self-pay | Admitting: Family Medicine

## 2015-07-19 ENCOUNTER — Telehealth: Payer: Self-pay | Admitting: *Deleted

## 2015-07-19 DIAGNOSIS — C50411 Malignant neoplasm of upper-outer quadrant of right female breast: Secondary | ICD-10-CM

## 2015-07-19 HISTORY — DX: Malignant neoplasm of upper-outer quadrant of right female breast: C50.411

## 2015-07-19 NOTE — Telephone Encounter (Signed)
Confirmed [facility] for 07/26/15 at 1215.  Instructions and contact information given.

## 2015-07-26 ENCOUNTER — Ambulatory Visit
Admission: RE | Admit: 2015-07-26 | Discharge: 2015-07-26 | Disposition: A | Discharge status: Home / Self Care | Payer: Medicare Other | Source: Ambulatory Visit | Attending: Radiation Oncology | Admitting: Radiation Oncology

## 2015-07-26 ENCOUNTER — Encounter: Payer: Self-pay | Admitting: Oncology

## 2015-07-26 ENCOUNTER — Other Ambulatory Visit (HOSPITAL_BASED_OUTPATIENT_CLINIC_OR_DEPARTMENT_OTHER): Payer: Medicare Other

## 2015-07-26 ENCOUNTER — Ambulatory Visit: Payer: Medicare Other | Attending: General Surgery | Admitting: Physical Therapy

## 2015-07-26 ENCOUNTER — Ambulatory Visit (HOSPITAL_BASED_OUTPATIENT_CLINIC_OR_DEPARTMENT_OTHER): Payer: Medicare Other | Admitting: Oncology

## 2015-07-26 ENCOUNTER — Other Ambulatory Visit: Payer: Self-pay | Admitting: General Surgery

## 2015-07-26 ENCOUNTER — Encounter: Payer: Self-pay | Admitting: Physical Therapy

## 2015-07-26 VITALS — BP 159/90 | HR 86 | Temp 98.2°F | Resp 18 | Ht 61.0 in | Wt 121.9 lb

## 2015-07-26 DIAGNOSIS — R29898 Other symptoms and signs involving the musculoskeletal system: Secondary | ICD-10-CM

## 2015-07-26 DIAGNOSIS — R293 Abnormal posture: Secondary | ICD-10-CM

## 2015-07-26 DIAGNOSIS — D0511 Intraductal carcinoma in situ of right breast: Secondary | ICD-10-CM | POA: Diagnosis not present

## 2015-07-26 DIAGNOSIS — C50411 Malignant neoplasm of upper-outer quadrant of right female breast: Secondary | ICD-10-CM

## 2015-07-26 DIAGNOSIS — M25551 Pain in right hip: Secondary | ICD-10-CM

## 2015-07-26 DIAGNOSIS — M25611 Stiffness of right shoulder, not elsewhere classified: Secondary | ICD-10-CM

## 2015-07-26 DIAGNOSIS — M25612 Stiffness of left shoulder, not elsewhere classified: Secondary | ICD-10-CM | POA: Diagnosis not present

## 2015-07-26 DIAGNOSIS — M4004 Postural kyphosis, thoracic region: Secondary | ICD-10-CM | POA: Diagnosis not present

## 2015-07-26 LAB — COMPREHENSIVE METABOLIC PANEL
ALK PHOS: 79 U/L (ref 40–150)
ALT: 13 U/L (ref 0–55)
ANION GAP: 6 meq/L (ref 3–11)
AST: 20 U/L (ref 5–34)
Albumin: 3.3 g/dL — ABNORMAL LOW (ref 3.5–5.0)
BUN: 13.3 mg/dL (ref 7.0–26.0)
CALCIUM: 9.4 mg/dL (ref 8.4–10.4)
CHLORIDE: 105 meq/L (ref 98–109)
CO2: 28 mEq/L (ref 22–29)
Creatinine: 0.8 mg/dL (ref 0.6–1.1)
EGFR: 70 mL/min/{1.73_m2} — AB (ref >90)
Glucose: 86 mg/dl (ref 70–140)
POTASSIUM: 4.8 meq/L (ref 3.5–5.1)
Sodium: 139 mEq/L (ref 136–145)
Total Bilirubin: 0.37 mg/dL (ref 0.20–1.20)
Total Protein: 7.7 g/dL (ref 6.4–8.3)

## 2015-07-26 LAB — CBC WITH DIFFERENTIAL/PLATELET
BASO%: 0.7 % (ref 0.0–2.0)
Basophils Absolute: 0 10*3/uL (ref 0.0–0.1)
EOS%: 5.8 % (ref 0.0–7.0)
Eosinophils Absolute: 0.3 10*3/uL (ref 0.0–0.5)
HCT: 41.4 % (ref 34.8–46.6)
HGB: 13.7 g/dL (ref 11.6–15.9)
LYMPH%: 19 % (ref 14.0–49.7)
MCH: 29.1 pg (ref 25.1–34.0)
MCHC: 33.1 g/dL (ref 31.5–36.0)
MCV: 88.1 fL (ref 79.5–101.0)
MONO#: 0.7 10*3/uL (ref 0.1–0.9)
MONO%: 11.7 % (ref 0.0–14.0)
NEUT%: 62.8 % (ref 38.4–76.8)
NEUTROS ABS: 3.6 10*3/uL (ref 1.5–6.5)
Platelets: 217 10*3/uL (ref 145–400)
RBC: 4.7 10*6/uL (ref 3.70–5.45)
RDW: 14.3 % (ref 11.2–14.5)
WBC: 5.7 10*3/uL (ref 3.9–10.3)
lymph#: 1.1 10*3/uL (ref 0.9–3.3)

## 2015-07-26 NOTE — Progress Notes (Signed)
Radiation Oncology         ([phone]) [phone] ________________________________  Name: Carmen Duncan MRN: [id number]  Date: 07/26/2015  DOB: May 25, 1941  [id number] W, MD  Carmen Kussmaul, MD     REFERRING PHYSICIAN: Autumn Messing III, MD   DIAGNOSIS: The encounter diagnosis was Breast cancer of upper-outer quadrant of right female breast [facility]).   HISTORY OF PRESENT ILLNESS::Carmen Duncan is a 75 y.o. 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 The patient was found to have calcifications of the right breast on 07/06/15 spanning 2.7 cm. A biopsy on 07/17/15 revealed high grade DCIS, ER/PR negative. She comes today to discuss the role of radiotherapy as a part of her cancer care with Dr. Lisbeth Renshaw.   PREVIOUS RADIATION THERAPY: No   PAST MEDICAL HISTORY:  Past Medical History  Diagnosis Date  . Arthritis   . Asthma   . Hypertension   . GERD (gastroesophageal reflux disease)   . Hyperlipidemia   . Diverticulosis   . IBS (irritable bowel syndrome)   . HOH (hard of hearing) left ear  . Acoustic neuroma ([place])   . Pneumonia     Dec. 2015  . Ejection fraction   . Breast cancer of upper-outer quadrant of right female breast ([place]) 07/19/2015       PAST SURGICAL HISTORY: Past Surgical History  Procedure Laterality Date  . Cholecystectomy  2011  . Abdominal hysterectomy  1980  . Tonsillectomy  1945  . Biopsy breast    . Eyes  2007    cararacts  . Tee without cardioversion N/A 09/05/2014    Procedure: TRANSESOPHAGEAL ECHOCARDIOGRAM (TEE);  Surgeon: Lelon Perla, MD;  Location: [facility] ENDOSCOPY;  Service: Cardiovascular;  Laterality: N/A;     FAMILY HISTORY:  Family History  Problem Relation Age of Onset  . Hyperlipidemia Mother   . Hypertension Mother   . Hyperlipidemia Father   . Heart disease Father 100  . Stroke Father      SOCIAL HISTORY:  reports that she has never smoked. She has never  used smokeless tobacco. She reports that she drinks alcohol. She reports that she does not use illicit drugs. She is widowed and resides in [place]. She is accompanied by her son and daughter in law. Her son is a Software engineer at [employer].   ALLERGIES: Other; Latex; Sulfonamide derivatives; and Zocor   MEDICATIONS:  Current Outpatient Prescriptions  Medication Sig Dispense Refill  . acetaminophen (TYLENOL) 325 MG tablet Take 650 mg by mouth every 6 (six) hours as needed for headache.    [name] atorvastatin (LIPITOR) 20 MG tablet TAKE ONE TABLET BY MOUTH ONCE DAILY 30 tablet 11  . cetirizine (ZYRTEC) 10 MG tablet Take 10 mg by mouth daily. Seasonally:  As needed    . clidinium-chlordiazePOXIDE (LIBRAX) 5-2.5 MG capsule Take 1 capsule by mouth 2 (two) times daily as needed. 60 capsule 3  . clopidogrel (PLAVIX) 75 MG tablet Take 1 tablet (75 mg total) by mouth daily. (Patient not taking: Reported on 06/08/2015) 30 tablet 5  . cyanocobalamin 500 MCG tablet Take 1 tablet (500 mcg total) by mouth daily. 30 tablet 5  . losartan (COZAAR) 100 MG tablet Take 1 tablet (100 mg total) by mouth daily. 30 tablet 11  . mupirocin ointment (BACTROBAN) 2 % Place 1 application into the nose 2 (two) times daily. 22 g 0  . pantoprazole (PROTONIX) 40 MG tablet  TAKE ONE TABLET BY MOUTH ONCE DAILY 30 tablet 5  . propranolol (INDERAL) 20 MG tablet Take 1 tablet (20 mg total) by mouth daily. 30 tablet 6  . triamcinolone cream (KENALOG) 0.1 % daily.  0   No current facility-administered medications for this encounter.     REVIEW OF SYSTEMS: On review of systems, the patient states that she is doing well overall. She does complain of intermittent constipation. She denies any    PHYSICAL EXAM:    Pain scale 0/10 In general this is a well appearing caucasian female in no acute distress. She is alert and oriented x4 and appropriate throughout the examination. HEENT reveals that the patient is normocephalic, atraumatic. EOMs are  intact. PERRLA. Skin is intact without any evidence of gross lesions. Cardiovascular exam reveals a regular rate and rhythm, no clicks rubs or murmurs are auscultated. Chest is clear to auscultation bilaterally. Lymphatic assessment is performed and does not reveal any adenopathy in the cervical, supraclavicular, axillary, or inguinal chains. Bilateral breasts are evaluated. There are post biopsy changes of the right breast, and no other abnormalities are palpable of either breast.  No nipple discharge is present. Abdomen has active bowel sounds in all quadrants and is intact. The abdomen is soft, non tender, non distended. Lower extremities are negative for pretibial pitting edema, deep calf tenderness, cyanosis or clubbing.   ECOG = 0  0 - Asymptomatic (Fully active, able to carry on all predisease activities without restriction)  1 - Symptomatic but completely ambulatory (Restricted in physically strenuous activity but ambulatory and able to carry out work of a light or sedentary nature. For example, light housework, office work)  2 - Symptomatic, <50% in bed during the day (Ambulatory and capable of all self care but unable to carry out any work activities. Up and about more than 50% of waking hours)  3 - Symptomatic, >50% in bed, but not bedbound (Capable of only limited self-care, confined to bed or chair 50% or more of waking hours)  4 - Bedbound (Completely disabled. Cannot carry on any self-care. Totally confined to bed or chair)  5 - Death   Carmen Duncan MM, Creech RH, Tormey DC, et al. [phone]). "Toxicity and response criteria of the [facility]". [place] Oncol. 5 (6): 649-55    LABORATORY DATA:  Lab Results  Component Value Date   WBC 5.7 07/26/2015   HGB 13.7 07/26/2015   HCT 41.4 07/26/2015   MCV 88.1 07/26/2015   PLT 217 07/26/2015   Lab Results  Component Value Date   NA 140 01/04/2015   K 4.3 01/04/2015   CL 105 01/04/2015   CO2 28 01/04/2015    Lab Results  Component Value Date   ALT 13 01/04/2015   AST 18 01/04/2015   ALKPHOS 64 01/04/2015   BILITOT 0.3 01/04/2015      RADIOGRAPHY: No results found.     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   PLAN: Dr. Lisbeth Renshaw discusses the pathology findings and reviews the nature of in situ disease. The consensus from the breast conference included breast conservation with lumpectomy, followed by external radiotherapy to the breast. The risks, benefits, and strategy of radiation were reviewed including a risk reduction of recurrence by 70 % with radiotherapy. Dr. Lisbeth Renshaw discusses the rationale for 20 fractions of treatment, and that we would see her back 2-3 weeks following surgery with the intention of beginning treatment about 4 weeks following surgery. She states  agreement and understanding of this plan, and at the end of the conversation all questions are answered to the satisfaction of the patient and her family.  The above documentation reflects my direct findings during this shared patient visit. Please see the separate note by Dr. Lisbeth Renshaw on this date for the remainder of the patient's plan of care.  Carola Rhine, PAC

## 2015-07-26 NOTE — Patient Instructions (Signed)

## 2015-07-26 NOTE — Therapy (Signed)
[place], [place], [address] Phone: [phone]   Fax:  [phone]  Physical Therapy Evaluation  Patient Details  Name: Carmen Duncan MRN: [id number] Date of Birth: Sep 08, 1940 Referring Provider: Dr. Autumn Messing  Encounter Date: 07/26/2015      PT End of Session - 07/26/15 1638    Visit Number 1   Number of Visits 1   PT Start Time [id number]   PT Stop Time 1545   PT Time Calculation (min) 30 min   Activity Tolerance Patient tolerated treatment well   Behavior During Therapy [facility] for tasks assessed/performed      Past Medical History  Diagnosis Date  . Arthritis   . Asthma   . Hypertension   . GERD (gastroesophageal reflux disease)   . Hyperlipidemia   . Diverticulosis   . IBS (irritable bowel syndrome)   . HOH (hard of hearing) left ear  . Acoustic neuroma ([place])   . Pneumonia     Dec. 2015  . Ejection fraction   . Breast cancer of upper-outer quadrant of right female breast ([place]) 07/19/2015  . Brain cancer [facility])     Past Surgical History  Procedure Laterality Date  . Cholecystectomy  2011  . Abdominal hysterectomy  1980  . Tonsillectomy  1945  . Biopsy breast    . Eyes  2007    cararacts  . Tee without cardioversion N/A 09/05/2014    Procedure: TRANSESOPHAGEAL ECHOCARDIOGRAM (TEE);  Surgeon: Lelon Perla, MD;  Location: [facility] ENDOSCOPY;  Service: Cardiovascular;  Laterality: N/A;    There were no vitals filed for this visit.  Visit Diagnosis:  Breast cancer of upper-outer quadrant of right female breast ([place]) - Plan: PT plan of care cert/re-cert  Abnormal posture - Plan: PT plan of care cert/re-cert  Postural kyphosis of thoracic region - Plan: PT plan of care cert/re-cert  Shoulder stiffness, right - Plan: PT plan of care cert/re-cert  Shoulder stiffness, left - Plan: PT plan of care cert/re-cert  Right hip pain - Plan: PT plan of care cert/re-cert  Muscular deconditioning -  Plan: PT plan of care cert/re-cert      Subjective Assessment - 07/26/15 1622    Subjective Patient was seen today for a baseline assessment of her newly diagnosed right breast cancer.   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  It measures 2.7 cm in the upper outer quadrant. Her medical team discussed her case and determined a recommended treatment plan in the breast multi-disciplinary clinic prior to assessing her.  She has a history of a prolkonged hospitalization from pneumonia in 2015 which resulted in generalized weakness and deconditioning. Her family reported she never really functionally recovered from that illness.    Patient Stated Goals Lymphedema risk reduction and post op shoulder ROM HEP   Currently in Pain? Yes   Pain Score 2    Pain Location Hip   Pain Orientation Right   Pain Descriptors / Indicators Aching   Pain Type Chronic pain   Pain Onset More than a month ago   Pain Frequency Intermittent   Aggravating Factors  Walking   Pain Relieving Factors rest   Effect of Pain on Daily Activities Avoids walking long distances            [facility] PT Assessment - 07/26/15 0001    Assessment   Medical Diagnosis  Right hip pain   Referring Provider Dr. Autumn Messing   Onset Date/Surgical Date 07/06/15   Hand Dominance Right   Prior Therapy none   Precautions   Precautions Fall;Other (comment)  Active cancer   Restrictions   Weight Bearing Restrictions No   Balance Screen   Has the patient fallen in the past 6 months No  She appears very weak and unsteady   Has the patient had a decrease in activity level because of a fear of falling?  No  Decreased activity due to hip pain   Is the patient reluctant to leave their home because of a fear of falling?  No  However, her son reports she is unsteady   Chief Technology Officer residence   Living  Arrangements Other relatives  Son, daughter-in-law, and 2 grandchildren   Available Help at Discharge Family   Prior Function   Level of [place] Retired   Leisure She does not exercise; reports being limited by hip pain   Cognition   Overall Cognitive Status Within Functional Limits for tasks assessed   Posture/Postural Control   Posture/Postural Control Postural limitations   Postural Limitations Rounded Shoulders;Forward head;Increased thoracic kyphosis   ROM / Strength   AROM / PROM / Strength AROM;Strength   AROM   AROM Assessment Site Shoulder   Right/Left Shoulder Right;Left   Right Shoulder Extension 46 Degrees   Right Shoulder Flexion 109 Degrees   Right Shoulder ABduction 138 Degrees   Right Shoulder Internal Rotation 62 Degrees   Right Shoulder External Rotation 70 Degrees   Left Shoulder Extension 66 Degrees   Left Shoulder Flexion 109 Degrees   Left Shoulder ABduction 130 Degrees   Left Shoulder Internal Rotation 80 Degrees   Left Shoulder External Rotation 57 Degrees   Strength   Overall Strength Comments Obvious general weakness; difficulty sitting erect due to trunk weakness; shoulders also appear to have limited ROM due to poor upper body strength           LYMPHEDEMA/ONCOLOGY QUESTIONNAIRE - 07/26/15 1636    Type   Cancer Type Right breast cancer   Lymphedema Assessments   Lymphedema Assessments Upper extremities   Right Upper Extremity Lymphedema   10 cm Proximal to Olecranon Process 27.5 cm   Olecranon Process 22.4 cm   10 cm Proximal to Ulnar Styloid Process 18.5 cm   Just Proximal to Ulnar Styloid Process 13.5 cm   Across Hand at [employer] 16.1 cm   At [place] of 2nd Digit 5.3 cm   Left Upper Extremity Lymphedema   10 cm Proximal to Olecranon Process 26 cm   Olecranon Process 20.9 cm   10 cm Proximal to Ulnar Styloid Process 17.7 cm   Just Proximal to Ulnar Styloid Process 13.4 cm   Across Hand at [employer]  15 cm   At [place] of 2nd Digit 5 cm      Patient was instructed today in a home exercise program today for post op shoulder range of motion. These included active assist shoulder flexion in sitting, scapular retraction, wall walking with shoulder abduction, and hands behind head external rotation.  She was encouraged to do these twice a day, holding 3 seconds and repeating 5 times when permitted by her physician.         PT Education - 07/26/15 1637    Education provided Yes   Education Details Lymphedema risk reduction and post op shoulder ROM  HEP   Person(s) Educated Patient;Child(ren)   Methods Explanation;Demonstration;Handout   Comprehension Verbalized understanding;Returned demonstration              Breast Clinic Goals - 09-Aug-2015 1641    Patient will be able to verbalize understanding of pertinent lymphedema risk reduction practices relevant to her diagnosis specifically related to skin care.   Time 1   Period Days   Status Achieved   Patient will be able to return demonstrate and/or verbalize understanding of the post-op home exercise program related to regaining shoulder range of motion.   Time 1   Period Days   Status Achieved   Patient will be able to verbalize understanding of the importance of attending the postoperative After Breast Cancer Class for further lymphedema risk reduction education and therapeutic exercise.   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  It measures 2.7 cm in the upper outer quadrant. Her medical team discussed her case and determined a recommended treatment plan in the breast multi-disciplinary clinic prior to assessing her.  She has a history of a prolonged hospitalization from pneumonia in 2015 which resulted in generalized weakness and deconditioning. Her family reported she never  really functionally recovered from that illness.   She is planning to have a right lumpectomy and sentinel node biopsy followed by radiation.  She will benefit from post op PT to address issues of decreased shoulder ROM, poor overall strength, fall risk, gait, and lymphedema risk.   Pt will benefit from skilled therapeutic intervention in order to improve on the following deficits Decreased strength;Decreased knowledge of precautions;Pain;Impaired UE functional use;Decreased range of motion   Rehab Potential Good   Clinical Impairments Affecting Rehab Potential deconditioned overall   PT Frequency One time visit   PT Treatment/Interventions Therapeutic exercise;Patient/family education   PT Next Visit Plan Will f/u after surgery   PT Home Exercise Plan Shoulder ROM HEP   Consulted and Agree with Plan of Care Patient;Family member/caregiver   Family Member Consulted son and daughter-in-law     Patient will follow up at outpatient cancer rehab if needed following surgery.  If the patient requires physical therapy at that time, a specific plan will be dictated and sent to the referring physician for approval. The patient was educated today on appropriate basic range of motion exercises to begin post operatively and the importance of attending the After Breast Cancer class following surgery.  Patient was educated today on lymphedema risk reduction practices as it pertains to recommendations that will benefit the patient immediately following surgery.  She verbalized good understanding.  No additional physical therapy is indicated at this time.         G-Codes - 08-09-15 1641    Functional Assessment Tool Used Clinical Judgement   Functional Limitation Other PT primary   Other PT Primary Current Status [id number]) At least 20 percent but less than 40 percent impaired, limited or restricted   Other PT Primary Goal Status [id number]) At least 20 percent but less than 40 percent impaired, limited or restricted    Other PT Primary Discharge Status [id number]) At least 20 percent but less than 40 percent impaired, limited or restricted       Problem List Patient Active Problem List   Diagnosis Date Noted  .  Breast cancer of upper-outer quadrant of right female breast ([place]) 07/19/2015  . B12 deficiency 03/14/2015  . Bilateral carotid artery disease ([place]) 10/07/2014  . Pulmonary hypertension ([place]) 10/07/2014  . Ejection fraction   . Cryptogenic stroke ([place]) 10/04/2014  . Oral candidiasis 04/28/2014  . Chronic diastolic congestive heart failure ([place]) 04/28/2014  . IPF (idiopathic pulmonary fibrosis) ([place]) 04/21/2014  . Hyponatremia 04/19/2014  . Thrombocytopenia ([place]) 04/19/2014  . Malnutrition of moderate degree ([place]) 04/19/2014  . Hypertension 04/18/2014  . Hyperlipidemia 04/18/2014  . Hypovolemia due to dehydration 04/18/2014  . Dyspnea on exertion 04/04/2014  . Chest rales 04/04/2014  . Abnormal nuclear stress test 04/04/2014  . HOH (hard of hearing)   . Raynaud's phenomenon 03/21/2014  . Tremor, essential 03/12/2013  . Unspecified hereditary and idiopathic peripheral neuropathy 08/04/2012  . Squamous cell skin cancer 08/19/2011  . 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 Dyslipidemia 04/03/2010  . Essential hypertension 04/03/2010  . GERD 04/03/2010  . IBS 04/03/2010  . DEEP VENOUS THROMBOPHLEBITIS, HX OF 04/03/2010  . DIVERTICULITIS, HX OF 04/03/2010    Annia Friendly, PT 07/26/2015 4:44 PM  [place], [place], [address] Phone: [phone]   Fax:  [phone]  Name: Carmen Duncan MRN: [id number] Date of Birth: [birth year]

## 2015-07-26 NOTE — Progress Notes (Signed)
[place]  [phone] [phone]     ID: GERALDIN DZIALO DOB: 1941/02/11  MR#: [id number]  [id number]  Patient Care Team: [name], MD as PCP - General Chauncey Cruel, MD as Consulting Physician (Oncology) Autumn Messing III, MD as Consulting Physician (General Surgery) Kyung Rudd, MD as Consulting Physician (Radiation Oncology) Rigoberto Noel, MD as Consulting Physician (Pulmonary Disease) PCP: [name], MD GYN: OTHER MD:  CHIEF COMPLAINT: Noninvasive breast cancer  CURRENT TREATMENT: Awaiting definitive surgery   BREAST CANCER HISTORY: Jazlin had routine screening mammography at [facility] 07/06/2015 showing a new group of pleomorphic calcifications in the right breast 11:00 position. This measured 2.7 cm. She was recalled for right diagnostic mammography every 16 2017, which showed the breast density to be category C. This confirmed an area of dystrophic calcification in the right breast 11:00 position. Biopsy of this area on the very 20 2017 showed (SAA [id number]) ductal carcinoma in situ, high-grade, estrogen and progesterone receptor negative.  Her subsequent history is as detailed below.  INTERVAL HISTORY: Carmen Duncan was evaluated in the multidisciplinary breast cancer clinic 07/26/2015, accompanied by her son Carmen Duncan and her daughter-in-law Carmen Duncan. Her case was also presented in the multidisciplinary breast cancer conference that same morning. At that time a preliminary plan was proposed: Breast conserving surgery with sentinel lymph node sampling, to be followed by radiation.  REVIEW OF SYSTEMS: There were no specific symptoms leading to the original mammogram, which was routinely scheduled. The patient denies unusual headaches, visual changes, nausea, vomiting, stiff neck, dizziness, or gait imbalance. There has been no cough, phlegm production, or pleurisy, no chest pain or pressure, and no change in bowel or bladder habits. The patient  denies fever, rash, bleeding, unexplained fatigue or unexplained weight loss. Virdie complains of mild heartburn, and admits to some forgetfulness and anxiety, but no depression. She has significant hearing loss. A detailed review of systems was otherwise entirely negative.  PAST MEDICAL HISTORY: Past Medical History  Diagnosis Date  . Arthritis   . Asthma   . Hypertension   . GERD (gastroesophageal reflux disease)   . Hyperlipidemia   . Diverticulosis   . IBS (irritable bowel syndrome)   . HOH (hard of hearing) left ear  . Acoustic neuroma ([place])   . Ejection fraction   . Breast cancer of upper-outer quadrant of right female breast ([place]) 07/19/2015  . Brain cancer ([place])   . Anxiety   . Stroke [facility])     no deficits, on plavix  . Essential tremor     on inderal  . Pneumonia     04-2014, CAP  . Interstitial lung disease ([place])     PAST SURGICAL HISTORY: Past Surgical History  Procedure Laterality Date  . Cholecystectomy  2011  . Abdominal hysterectomy  1980  . Tonsillectomy  1945  . Biopsy breast    . Eyes  2007    cararacts  . Tee without cardioversion N/A 09/05/2014    Procedure: TRANSESOPHAGEAL ECHOCARDIOGRAM (TEE);  Surgeon: Lelon Perla, MD;  Location: [facility] ENDOSCOPY;  Service: Cardiovascular;  Laterality: N/A;    FAMILY HISTORY Family History  Problem Relation Age of Onset  . Hyperlipidemia Mother   . Hypertension Mother   . Hyperlipidemia Father   . Heart disease Father 57  . Stroke Father   . Lung cancer Paternal Uncle     x 4  The patient's father died at the age of 56 from heart related problems. The patient's  mother died at age 87, cause of death being unclear to the patient. Mallery had no brothers, 1 sister. There is significant lung cancer on the paternal side, and several uncles all of whom were smokers. There is one cousin on the paternal side was diagnosed with cancer in her 85s , primary site unknown to the patient  GYNECOLOGIC HISTORY:  No LMP recorded.  Patient has had a hysterectomy. Menarche age 83, first live birth age 40. The patient is GX P1. She is status post hysterectomy but no salpingo-oophorectomy. She used hormone replacement for some time but does not recall how long. 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 She is widowed and lives with her son Jacqulyn Cane, who is a Software engineer and works for our system. He is also on Scientist, physiological. The patient has 3 grandchildren. She attends a [employer]    ADVANCED DIRECTIVES: In place   HEALTH MAINTENANCE: Social History  Substance Use Topics  . Smoking status: Never Smoker   . Smokeless tobacco: Never Used  . Alcohol Use: 0.0 oz/week    0 Standard drinks or equivalent per week     Comment: wine about 3 times a week     Colonoscopy: NOV 2009  PAP: s/p hysterectomy  Bone density:  Lipid panel:  Allergies  Allergen Reactions  . Latex Itching, Rash and Other (See Comments)    Red angry skin from latex tape  . Sulfonamide Derivatives Rash    hives    Current Outpatient Prescriptions  Medication Sig Dispense Refill  . acetaminophen (TYLENOL) 325 MG tablet Take 650 mg by mouth every 6 (six) hours as needed for headache.    [name] atorvastatin (LIPITOR) 20 MG tablet TAKE ONE TABLET BY MOUTH ONCE DAILY 30 tablet 11  . clidinium-chlordiazePOXIDE (LIBRAX) 5-2.5 MG capsule Take 1 capsule by mouth 2 (two) times daily as needed. 60 capsule 3  . clopidogrel (PLAVIX) 75 MG tablet Take 1 tablet (75 mg total) by mouth daily. 30 tablet 5  . pantoprazole (PROTONIX) 40 MG tablet TAKE ONE TABLET BY MOUTH ONCE DAILY 30 tablet 5  . Probiotic Product (ACIDOPHILUS) 90-25 MG CHEW Chew by mouth.    . propranolol (INDERAL) 20 MG tablet Take 1 tablet (20 mg total) by mouth daily. 30 tablet 6   No current facility-administered medications for this visit.    OBJECTIVE: Older white woman who  appears stated age 75 Vitals:   07/26/15 1307  BP: 159/90  Pulse: 86  Temp: 98.2 F (36.8 C)  Resp: 18     Body mass index is 23.04 kg/(m^2).    ECOG FS:1 - Symptomatic but completely ambulatory  Ocular: Sclerae unicteric, EOMs intact Ear-nose-throat: Oropharynx clear, slightly dry Lymphatic: No cervical or supraclavicular adenopathy Lungs no rales or rhonchi, good excursion bilaterally Heart regular rate and rhythm, no murmur appreciated Abd soft, nontender, positive bowel sounds MSK no focal spinal tenderness, no joint edema Neuro: non-focal, well-oriented but vague in some answers, depending on her family to answer some history questions; also hard of hearing which may compromise her understanding, appropriate affect Breasts: The right breast is status post recent biopsy. I do not palpate a mass. There is no skin or nipple changes of concern. The right axilla is benign. The left breast is unremarkable.   LAB RESULTS:  CMP     Component Value Date/Time  NA 139 07/26/2015 1251   NA 140 01/04/2015 1029   NA 139 05/03/2014 0630   K 4.8 07/26/2015 1251   K 4.3 01/04/2015 1029   K 3.2* 05/03/2014 0630   CL 105 01/04/2015 1029   CL 103 05/03/2014 0630   CO2 28 07/26/2015 1251   CO2 28 01/04/2015 1029   CO2 33* 05/03/2014 0630   GLUCOSE 86 07/26/2015 1251   GLUCOSE 90 01/04/2015 1029   GLUCOSE 84 05/03/2014 0630   BUN 13.3 07/26/2015 1251   BUN 14 01/04/2015 1029   BUN 17 05/03/2014 0630   CREATININE 0.8 07/26/2015 1251   CREATININE 0.81 01/04/2015 1029   CREATININE 0.82 05/03/2014 0630   CALCIUM 9.4 07/26/2015 1251   CALCIUM 9.7 01/04/2015 1029   CALCIUM 8.4* 05/03/2014 0630   PROT 7.7 07/26/2015 1251   PROT 7.9 01/04/2015 1029   ALBUMIN 3.3* 07/26/2015 1251   ALBUMIN 3.8 01/04/2015 1029   AST 20 07/26/2015 1251   AST 18 01/04/2015 1029   ALT 13 07/26/2015 1251   ALT 13 01/04/2015 1029   ALKPHOS 79 07/26/2015 1251   ALKPHOS 64 01/04/2015 1029   BILITOT 0.37  07/26/2015 1251   BILITOT 0.3 01/04/2015 1029   GFRNONAA >60 12/16/2014 1320   GFRNONAA >60 05/03/2014 0630   GFRAA >60 12/16/2014 1320   GFRAA >60 05/03/2014 0630    INo results found for: SPEP, UPEP  Lab Results  Component Value Date   WBC 5.7 07/26/2015   NEUTROABS 3.6 07/26/2015   HGB 13.7 07/26/2015   HCT 41.4 07/26/2015   MCV 88.1 07/26/2015   PLT 217 07/26/2015      Chemistry      Component Value Date/Time   NA 139 07/26/2015 1251   NA 140 01/04/2015 1029   NA 139 05/03/2014 0630   K 4.8 07/26/2015 1251   K 4.3 01/04/2015 1029   K 3.2* 05/03/2014 0630   CL 105 01/04/2015 1029   CL 103 05/03/2014 0630   CO2 28 07/26/2015 1251   CO2 28 01/04/2015 1029   CO2 33* 05/03/2014 0630   BUN 13.3 07/26/2015 1251   BUN 14 01/04/2015 1029   BUN 17 05/03/2014 0630   CREATININE 0.8 07/26/2015 1251   CREATININE 0.81 01/04/2015 1029   CREATININE 0.82 05/03/2014 0630      Component Value Date/Time   CALCIUM 9.4 07/26/2015 1251   CALCIUM 9.7 01/04/2015 1029   CALCIUM 8.4* 05/03/2014 0630   ALKPHOS 79 07/26/2015 1251   ALKPHOS 64 01/04/2015 1029   AST 20 07/26/2015 1251   AST 18 01/04/2015 1029   ALT 13 07/26/2015 1251   ALT 13 01/04/2015 1029   BILITOT 0.37 07/26/2015 1251   BILITOT 0.3 01/04/2015 1029       No results found for: LABCA2  No components found for: LABCA125  No results for input(s): INR in the last 168 hours.  Urinalysis    Component Value Date/Time   COLORURINE AMBER* 04/18/2014 2205   APPEARANCEUR CLEAR 04/18/2014 2205   LABSPEC 1.018 04/18/2014 2205   PHURINE 5.5 04/18/2014 2205   GLUCOSEU NEGATIVE 04/18/2014 2205   HGBUR NEGATIVE 04/18/2014 2205   BILIRUBINUR NEGATIVE 04/18/2014 2205   BILIRUBINUR neg 10/02/2010 1108   KETONESUR NEGATIVE 04/18/2014 2205   PROTEINUR NEGATIVE 04/18/2014 2205   PROTEINUR neg 10/02/2010 1108   UROBILINOGEN 0.2 04/18/2014 2205   UROBILINOGEN 0.2 10/02/2010 1108   NITRITE NEGATIVE 04/18/2014 2205    NITRITE neg 10/02/2010 1108   LEUKOCYTESUR NEGATIVE  04/18/2014 2205      ELIGIBLE FOR AVAILABLE RESEARCH PROTOCOL: no  STUDIES: Outside studies reviewed   ASSESSMENT: 75 y.o. [place] woman, status post right breast upper outer quadrant biopsy 07/17/2015 for ductal carcinoma in situ, grade 3, estrogen and progesterone receptor negative, associated with an area of calcifications measuring 2.7 cm  (1) breast conserving surgery with sentinel lymph node sampling planned  (2) adjuvant radiation to follow surgery  PLAN: We spent the better part of today's 45-minute appointment discussing the biology of breast cancer in general, and the specifics of the patient's tumor in particular. The patient understands that in noninvasive ductal carcinoma, also called ductal carcinoma in situ ("DCIS") the breast cancer cells remain trapped in the ducts were they started. They cannot travel to a vital organ. For that reason these cancers in themselves are not life-threatening.  If the whole breast is removed then all the ducts are removed and since the cancer cells are trapped in the ducts, the cure rate with mastectomy for noninvasive breast cancer is approximately 99%. Nevertheless we recommend lumpectomy, because there is no survival advantage to mastectomy and because the cosmetic result is generally superior with breast conservation.  Because of the size and location of the pleomorphic calcifications we are also recommending sentinel lymph node biopsy.  Since the patient is keeping her breasts, there will be some risk of recurrence. The recurrence can only be in the same breast since, again, the cells are trapped in the ducts. The risk of local recurrence is cut by more than half with radiation, which is standard in this situation.  In estrogen receptor positive cancers anti-estrogens can also be considered. They would further reduce the risk of recurrence by one half. However, since in this case the  breast cancer cells do not "eat" estrogen, no antiestrogen therapy is planned.  For that reason I am not making Ms. Minichiello a return appointment with me. She understands that I will be glad to see her at any point in the future if on when the need arises  The patient has a good understanding of the overall plan. She agrees with it. She knows the goal of treatment in her case is cure. She will call with any problems that may develop before her next visit here.  Chauncey Cruel, MD   07/30/2015 11:05 AM Medical Oncology and Hematology [facility] [address] [place], [place] [address] Tel. [phone]    Fax. [phone]

## 2015-07-28 ENCOUNTER — Encounter (HOSPITAL_BASED_OUTPATIENT_CLINIC_OR_DEPARTMENT_OTHER): Payer: Self-pay | Admitting: *Deleted

## 2015-07-28 NOTE — Progress Notes (Signed)
[facility] Psychosocial Distress Screening Counseling Intern  Counseling Intern was referred by distress screening protocol.  The patient scored a 3 on the Psychosocial Distress Thermometer which indicates Mild distress. Counseling Intern Vaughan Sine to assess for distress and other psychosocial needs.    ONCBCN DISTRESS SCREENING 07/26/2015  Screening Type Initial Screening  Distress experienced in past week (1-10) 3  Emotional problem type Nervousness/Anxiety;Adjusting to illness  Physical Problem type Skin dry/itchy  Referral to support programs Yes   Counseling Intern Note: Met with Pt, son, and daughter-in-law during [facility]. Pt reported that her distress had decreased slightly by the time of our encounter.  Pt and family verbalized feelings of relief after learning more about Pt's Dx and Tx plan. Pt and family endorsed strong coping skills and support network including family and friends.  Counseling Intern provided information about services and resources available in the family support center. Pt and family stated that they would reach out as needed.   FU: No specific support center follow up at this time.   Vaughan Sine [phone]  Counseling Intern

## 2015-07-31 ENCOUNTER — Telehealth: Payer: Self-pay | Admitting: *Deleted

## 2015-07-31 NOTE — Telephone Encounter (Signed)
Spoke to pt concerning [place] from 07/26/15. Denies questions or concerns regarding dx or treatment care plan.  Encourage pt to call with needs. Received verbal understanding.

## 2015-08-01 ENCOUNTER — Encounter: Payer: Self-pay | Admitting: Neurology

## 2015-08-01 ENCOUNTER — Telehealth: Payer: Self-pay | Admitting: Neurology

## 2015-08-01 NOTE — Telephone Encounter (Signed)
Patient made aware that she can stop Plavix 5 days prior to surgery. Aware of increased risk of stroke while off medication.

## 2015-08-04 ENCOUNTER — Encounter (HOSPITAL_BASED_OUTPATIENT_CLINIC_OR_DEPARTMENT_OTHER)
Admission: RE | Admit: 2015-08-04 | Discharge: 2015-08-04 | Disposition: A | Discharge status: Home / Self Care | Payer: Medicare Other | Source: Ambulatory Visit | Attending: General Surgery | Admitting: General Surgery

## 2015-08-04 ENCOUNTER — Other Ambulatory Visit: Payer: Self-pay

## 2015-08-04 DIAGNOSIS — Z01810 Encounter for preprocedural cardiovascular examination: Secondary | ICD-10-CM | POA: Diagnosis not present

## 2015-08-04 DIAGNOSIS — I10 Essential (primary) hypertension: Secondary | ICD-10-CM | POA: Diagnosis not present

## 2015-08-04 DIAGNOSIS — C50411 Malignant neoplasm of upper-outer quadrant of right female breast: Secondary | ICD-10-CM | POA: Diagnosis not present

## 2015-08-09 DIAGNOSIS — Z0000 Encounter for general adult medical examination without abnormal findings: Secondary | ICD-10-CM | POA: Diagnosis not present

## 2015-08-09 DIAGNOSIS — R922 Inconclusive mammogram: Secondary | ICD-10-CM | POA: Diagnosis not present

## 2015-08-09 DIAGNOSIS — R928 Other abnormal and inconclusive findings on diagnostic imaging of breast: Secondary | ICD-10-CM | POA: Diagnosis not present

## 2015-08-09 DIAGNOSIS — C50411 Malignant neoplasm of upper-outer quadrant of right female breast: Secondary | ICD-10-CM | POA: Diagnosis not present

## 2015-08-09 DIAGNOSIS — Z1231 Encounter for screening mammogram for malignant neoplasm of breast: Secondary | ICD-10-CM | POA: Diagnosis not present

## 2015-08-09 LAB — HM MAMMOGRAPHY

## 2015-08-10 ENCOUNTER — Ambulatory Visit (HOSPITAL_BASED_OUTPATIENT_CLINIC_OR_DEPARTMENT_OTHER): Payer: Medicare Other | Admitting: Anesthesiology

## 2015-08-10 ENCOUNTER — Encounter (HOSPITAL_COMMUNITY)
Admission: RE | Admit: 2015-08-10 | Discharge: 2015-08-10 | Disposition: A | Discharge status: Home / Self Care | Payer: Medicare Other | Source: Ambulatory Visit | Attending: General Surgery | Admitting: General Surgery

## 2015-08-10 ENCOUNTER — Encounter (HOSPITAL_BASED_OUTPATIENT_CLINIC_OR_DEPARTMENT_OTHER)
Admission: RE | Disposition: A | Discharge status: Home / Self Care | Payer: Self-pay | Source: Ambulatory Visit | Attending: General Surgery

## 2015-08-10 ENCOUNTER — Encounter (HOSPITAL_BASED_OUTPATIENT_CLINIC_OR_DEPARTMENT_OTHER): Payer: Self-pay | Admitting: *Deleted

## 2015-08-10 ENCOUNTER — Ambulatory Visit (HOSPITAL_BASED_OUTPATIENT_CLINIC_OR_DEPARTMENT_OTHER)
Admission: RE | Admit: 2015-08-10 | Discharge: 2015-08-10 | Disposition: A | Discharge status: Home / Self Care | Payer: Medicare Other | Source: Ambulatory Visit | Attending: General Surgery | Admitting: General Surgery

## 2015-08-10 DIAGNOSIS — I4891 Unspecified atrial fibrillation: Secondary | ICD-10-CM | POA: Insufficient documentation

## 2015-08-10 DIAGNOSIS — Z86718 Personal history of other venous thrombosis and embolism: Secondary | ICD-10-CM | POA: Insufficient documentation

## 2015-08-10 DIAGNOSIS — R079 Chest pain, unspecified: Secondary | ICD-10-CM | POA: Diagnosis not present

## 2015-08-10 DIAGNOSIS — E7800 Pure hypercholesterolemia, unspecified: Secondary | ICD-10-CM | POA: Insufficient documentation

## 2015-08-10 DIAGNOSIS — K219 Gastro-esophageal reflux disease without esophagitis: Secondary | ICD-10-CM | POA: Insufficient documentation

## 2015-08-10 DIAGNOSIS — D0511 Intraductal carcinoma in situ of right breast: Secondary | ICD-10-CM | POA: Diagnosis not present

## 2015-08-10 DIAGNOSIS — Z79899 Other long term (current) drug therapy: Secondary | ICD-10-CM | POA: Diagnosis not present

## 2015-08-10 DIAGNOSIS — Z86711 Personal history of pulmonary embolism: Secondary | ICD-10-CM | POA: Insufficient documentation

## 2015-08-10 DIAGNOSIS — Z7902 Long term (current) use of antithrombotics/antiplatelets: Secondary | ICD-10-CM | POA: Diagnosis not present

## 2015-08-10 DIAGNOSIS — D241 Benign neoplasm of right breast: Secondary | ICD-10-CM | POA: Diagnosis not present

## 2015-08-10 DIAGNOSIS — Z8673 Personal history of transient ischemic attack (TIA), and cerebral infarction without residual deficits: Secondary | ICD-10-CM | POA: Insufficient documentation

## 2015-08-10 DIAGNOSIS — G8918 Other acute postprocedural pain: Secondary | ICD-10-CM | POA: Diagnosis not present

## 2015-08-10 DIAGNOSIS — I10 Essential (primary) hypertension: Secondary | ICD-10-CM | POA: Diagnosis not present

## 2015-08-10 DIAGNOSIS — C50411 Malignant neoplasm of upper-outer quadrant of right female breast: Secondary | ICD-10-CM | POA: Diagnosis not present

## 2015-08-10 HISTORY — DX: Interstitial pulmonary disease, unspecified: J84.9

## 2015-08-10 HISTORY — DX: Anxiety disorder, unspecified: F41.9

## 2015-08-10 HISTORY — DX: Essential tremor: G25.0

## 2015-08-10 HISTORY — DX: Cerebral infarction, unspecified: I63.9

## 2015-08-10 HISTORY — PX: BREAST LUMPECTOMY WITH RADIOACTIVE SEED AND SENTINEL LYMPH NODE BIOPSY: SHX6550

## 2015-08-10 SURGERY — BREAST LUMPECTOMY WITH RADIOACTIVE SEED AND SENTINEL LYMPH NODE BIOPSY
Anesthesia: General | Site: Breast | Laterality: Right

## 2015-08-10 MED ORDER — FENTANYL CITRATE (PF) 100 MCG/2ML IJ SOLN
INTRAMUSCULAR | Status: AC
  Filled 2015-08-10: qty 2

## 2015-08-10 MED ORDER — CHLORHEXIDINE GLUCONATE 4 % EX LIQD: 1.0000 "application " | Freq: Once | CUTANEOUS | Status: DC

## 2015-08-10 MED ORDER — CEFAZOLIN SODIUM-DEXTROSE 2-3 GM-% IV SOLR
2.0000 g | INTRAVENOUS | Status: AC
  Administered 2015-08-10: 2 g via INTRAVENOUS

## 2015-08-10 MED ORDER — BUPIVACAINE-EPINEPHRINE (PF) 0.5% -1:200000 IJ SOLN
INTRAMUSCULAR | Status: DC | PRN
  Administered 2015-08-10: 25 mL via PERINEURAL

## 2015-08-10 MED ORDER — MIDAZOLAM HCL 2 MG/2ML IJ SOLN
INTRAMUSCULAR | Status: AC
  Filled 2015-08-10: qty 2

## 2015-08-10 MED ORDER — MIDAZOLAM HCL 2 MG/2ML IJ SOLN
1.0000 mg | INTRAMUSCULAR | Status: DC | PRN
  Administered 2015-08-10 (×2): 1 mg via INTRAVENOUS

## 2015-08-10 MED ORDER — OXYCODONE HCL 5 MG PO TABS
ORAL_TABLET | ORAL | Status: AC
  Filled 2015-08-10: qty 1

## 2015-08-10 MED ORDER — BUPIVACAINE HCL (PF) 0.25 % IJ SOLN
INTRAMUSCULAR | Status: DC | PRN
  Administered 2015-08-10: 30 mL

## 2015-08-10 MED ORDER — LIDOCAINE HCL (CARDIAC) 20 MG/ML IV SOLN
INTRAVENOUS | Status: DC | PRN
  Administered 2015-08-10: 70 mg via INTRAVENOUS

## 2015-08-10 MED ORDER — ONDANSETRON HCL 4 MG/2ML IJ SOLN
INTRAMUSCULAR | Status: AC
  Filled 2015-08-10: qty 2

## 2015-08-10 MED ORDER — LACTATED RINGERS IV SOLN
INTRAVENOUS | Status: DC
  Administered 2015-08-10 (×2): via INTRAVENOUS

## 2015-08-10 MED ORDER — PROPOFOL 10 MG/ML IV BOLUS
INTRAVENOUS | Status: DC | PRN
  Administered 2015-08-10: 150 mg via INTRAVENOUS

## 2015-08-10 MED ORDER — OXYCODONE-ACETAMINOPHEN 5-325 MG PO TABS: 1.0000 | ORAL_TABLET | ORAL | Status: DC | PRN

## 2015-08-10 MED ORDER — MEPERIDINE HCL 25 MG/ML IJ SOLN: 6.2500 mg | INTRAMUSCULAR | Status: DC | PRN

## 2015-08-10 MED ORDER — EPHEDRINE SULFATE 50 MG/ML IJ SOLN
INTRAMUSCULAR | Status: AC
  Filled 2015-08-10: qty 1

## 2015-08-10 MED ORDER — TECHNETIUM TC 99M SULFUR COLLOID FILTERED
1.0000 | Freq: Once | INTRAVENOUS | Status: AC | PRN
  Administered 2015-08-10: 1 via INTRADERMAL

## 2015-08-10 MED ORDER — SCOPOLAMINE 1 MG/3DAYS TD PT72: 1.0000 | MEDICATED_PATCH | Freq: Once | TRANSDERMAL | Status: DC | PRN

## 2015-08-10 MED ORDER — EPHEDRINE SULFATE 50 MG/ML IJ SOLN
INTRAMUSCULAR | Status: DC | PRN
  Administered 2015-08-10: 10 mg via INTRAVENOUS

## 2015-08-10 MED ORDER — FENTANYL CITRATE (PF) 100 MCG/2ML IJ SOLN
50.0000 ug | INTRAMUSCULAR | Status: DC | PRN
  Administered 2015-08-10 (×2): 100 ug via INTRAVENOUS

## 2015-08-10 MED ORDER — ONDANSETRON HCL 4 MG/2ML IJ SOLN
INTRAMUSCULAR | Status: DC | PRN
  Administered 2015-08-10: 4 mg via INTRAVENOUS

## 2015-08-10 MED ORDER — LIDOCAINE HCL (CARDIAC) 20 MG/ML IV SOLN
INTRAVENOUS | Status: AC
  Filled 2015-08-10: qty 5

## 2015-08-10 MED ORDER — GLYCOPYRROLATE 0.2 MG/ML IJ SOLN: 0.2000 mg | Freq: Once | INTRAMUSCULAR | Status: DC | PRN

## 2015-08-10 MED ORDER — PHENYLEPHRINE HCL 10 MG/ML IJ SOLN
INTRAMUSCULAR | Status: DC | PRN
  Administered 2015-08-10 (×4): 80 ug via INTRAVENOUS

## 2015-08-10 MED ORDER — CEFAZOLIN SODIUM-DEXTROSE 2-3 GM-% IV SOLR
INTRAVENOUS | Status: AC
  Filled 2015-08-10: qty 50

## 2015-08-10 MED ORDER — OXYCODONE HCL 5 MG PO TABS
5.0000 mg | ORAL_TABLET | Freq: Once | ORAL | Status: AC
Start: 2015-08-10 — End: 2015-08-10
  Administered 2015-08-10: 5 mg via ORAL

## 2015-08-10 MED ORDER — FENTANYL CITRATE (PF) 100 MCG/2ML IJ SOLN: 25.0000 ug | INTRAMUSCULAR | Status: DC | PRN

## 2015-08-10 SURGICAL SUPPLY — 37 items
APPLIER CLIP 9.375 MED OPEN (MISCELLANEOUS) ×2
BLADE SURG 15 STRL LF DISP TIS (BLADE) ×1 IMPLANT
BLADE SURG 15 STRL SS (BLADE) ×1
CANISTER SUC SOCK COL 7IN (MISCELLANEOUS) IMPLANT
CANISTER SUCT 1200ML W/VALVE (MISCELLANEOUS) IMPLANT
CHLORAPREP W/TINT 26ML (MISCELLANEOUS) ×2 IMPLANT
CLIP APPLIE 9.375 MED OPEN (MISCELLANEOUS) ×1 IMPLANT
COVER BACK TABLE 60X90IN (DRAPES) ×2 IMPLANT
COVER MAYO STAND STRL (DRAPES) ×2 IMPLANT
COVER PROBE W GEL 5X96 (DRAPES) ×2 IMPLANT
DEVICE DUBIN W/COMP PLATE 8390 (MISCELLANEOUS) ×2 IMPLANT
DRAPE LAPAROSCOPIC ABDOMINAL (DRAPES) ×2 IMPLANT
DRAPE UTILITY XL STRL (DRAPES) ×2 IMPLANT
ELECT COATED BLADE 2.86 ST (ELECTRODE) ×4 IMPLANT
ELECT REM PT RETURN 9FT ADLT (ELECTROSURGICAL) ×2
ELECTRODE REM PT RTRN 9FT ADLT (ELECTROSURGICAL) ×1 IMPLANT
GLOVE BIOGEL PI IND STRL 7.0 (GLOVE) ×1 IMPLANT
GLOVE BIOGEL PI IND STRL 7.5 (GLOVE) ×2 IMPLANT
GLOVE BIOGEL PI INDICATOR 7.0 (GLOVE) ×1
GLOVE BIOGEL PI INDICATOR 7.5 (GLOVE) ×2
GLOVE SURG SS PI 7.5 STRL IVOR (GLOVE) ×6 IMPLANT
GOWN STRL REUS W/ TWL LRG LVL3 (GOWN DISPOSABLE) ×3 IMPLANT
GOWN STRL REUS W/TWL LRG LVL3 (GOWN DISPOSABLE) ×3
KIT MARKER MARGIN INK (KITS) ×2 IMPLANT
LIQUID BAND (GAUZE/BANDAGES/DRESSINGS) ×2 IMPLANT
NEEDLE HYPO 25X1 1.5 SAFETY (NEEDLE) ×2 IMPLANT
NS IRRIG 1000ML POUR BTL (IV SOLUTION) ×2 IMPLANT
PACK BASIN DAY SURGERY FS (CUSTOM PROCEDURE TRAY) ×2 IMPLANT
PENCIL BUTTON HOLSTER BLD 10FT (ELECTRODE) ×4 IMPLANT
SLEEVE SCD COMPRESS KNEE MED (MISCELLANEOUS) ×2 IMPLANT
SPONGE LAP 18X18 X RAY DECT (DISPOSABLE) ×2 IMPLANT
SUT MON AB 4-0 PC3 18 (SUTURE) ×4 IMPLANT
SUT VICRYL 3-0 CR8 SH (SUTURE) ×2 IMPLANT
SYR CONTROL 10ML LL (SYRINGE) ×2 IMPLANT
TOWEL OR 17X24 6PK STRL BLUE (TOWEL DISPOSABLE) ×2 IMPLANT
TOWEL OR NON WOVEN STRL DISP B (DISPOSABLE) ×2 IMPLANT
TUBE CONNECTING 20X1/4 (TUBING) IMPLANT

## 2015-08-10 NOTE — Anesthesia Procedure Notes (Addendum)
Anesthesia Regional Block:  Pectoralis block  Pre-Anesthetic Checklist: ,, timeout performed, Correct Patient, Correct Site, Correct Laterality, Correct Procedure, Correct Position, site marked, Risks and benefits discussed,  Surgical consent,  Pre-op evaluation,  At surgeon's request and post-op pain management  Laterality: Right and Upper  Prep: chloraprep       Needles:  Injection technique: Single-shot  Needle Type: Echogenic Needle     Needle Length: 9cm 9 cm Needle Gauge: 21 and 21 G    Additional Needles:  Procedures: ultrasound guided (picture in chart) Pectoralis block Narrative:  Start time: 08/10/2015 10:40 AM End time: 08/10/2015 10:45 AM Injection made incrementally with aspirations every 5 mL.  Performed by: Personally  Anesthesiologist: CREWS, DAVID   Procedure Name: LMA Insertion Date/Time: 08/10/2015 11:23 AM Performed by: Bonney Aid Pre-anesthesia Checklist: Patient identified, Emergency Drugs available, Suction available and Patient being monitored Patient Re-evaluated:Patient Re-evaluated prior to inductionOxygen Delivery Method: Circle System Utilized Preoxygenation: Pre-oxygenation with 100% oxygen Intubation Type: IV induction Ventilation: Mask ventilation without difficulty LMA: LMA inserted LMA Size: 3.0 Number of attempts: 1 Airway Equipment and Method: Bite block Placement Confirmation: positive ETCO2 Tube secured with: Tape Dental Injury: Teeth and Oropharynx as per pre-operative assessment       Right PEC block image

## 2015-08-10 NOTE — Progress Notes (Signed)
Nuc med staff performed nuc med inj. Additional sedation required. Pt tol well, see VS in flowsheet. Will call family to bedside and update/offer emotional support

## 2015-08-10 NOTE — Discharge Instructions (Signed)

## 2015-08-10 NOTE — Progress Notes (Signed)
Assisted Dr. Crews with right, ultrasound guided, pectoralis block. Side rails up, monitors on throughout procedure. See vital signs in flow sheet. Tolerated Procedure well. 

## 2015-08-10 NOTE — Anesthesia Preprocedure Evaluation (Addendum)
Anesthesia Evaluation  Patient identified by MRN, date of birth, ID band Patient awake    Reviewed: Allergy & Precautions, NPO status , Patient's Chart, lab work & pertinent test results  Airway Mallampati: I  TM Distance: >3 FB Neck ROM: Full    Dental  (+) Teeth Intact, Dental Advisory Given   Pulmonary asthma ,    breath sounds clear to auscultation       Cardiovascular hypertension, Pt. on medications and Pt. on home beta blockers  Rhythm:Regular Rate:Normal     Neuro/Psych    GI/Hepatic GERD  Medicated and Controlled,  Endo/Other    Renal/GU      Musculoskeletal   Abdominal   Peds  Hematology   Anesthesia Other Findings   Reproductive/Obstetrics                           Anesthesia Physical Anesthesia Plan  ASA: III  Anesthesia Plan: General   Post-op Pain Management: MAC Combined w/ Regional for Post-op pain   Induction: Intravenous  Airway Management Planned: LMA  Additional Equipment:   Intra-op Plan:   Post-operative Plan: Extubation in OR  Informed Consent: I have reviewed the patients History and Physical, chart, labs and discussed the procedure including the risks, benefits and alternatives for the proposed anesthesia with the patient or authorized representative who has indicated his/her understanding and acceptance.   Dental advisory given  Plan Discussed with: CRNA, Anesthesiologist and Surgeon  Anesthesia Plan Comments:         Anesthesia Quick Evaluation

## 2015-08-10 NOTE — Interval H&P Note (Signed)
History and Physical Interval Note:  08/10/2015 11:03 AM  Carmen Duncan  has presented today for surgery, with the diagnosis of RIGHT BREAST DCIS  The various methods of treatment have been discussed with the patient and family. After consideration of risks, benefits and other options for treatment, the patient has consented to  Procedure(s): BREAST LUMPECTOMY WITH RADIOACTIVE SEED AND SENTINEL LYMPH NODE BIOPSY (Right) as a surgical intervention .  The patient's history has been reviewed, patient examined, no change in status, stable for surgery.  I have reviewed the patient's chart and labs.  Questions were answered to the patient's satisfaction.     TOTH III,PAUL S

## 2015-08-10 NOTE — Op Note (Signed)
08/10/2015  12:39 PM  PATIENT:  Carmen Duncan  75 y.o. female  PRE-OPERATIVE DIAGNOSIS:  RIGHT BREAST DCIS  POST-OPERATIVE DIAGNOSIS:  RIGHT BREAST DCIS  PROCEDURE:  Procedure(s): BREAST LUMPECTOMY WITH RADIOACTIVE SEED AND SENTINEL LYMPH NODE BIOPSY (Right)  SURGEON:  Surgeon(s) and Role:    * Jovita Kussmaul, MD - Primary  PHYSICIAN ASSISTANT:   ASSISTANTS: none   ANESTHESIA:   general  EBL:  Total I/O In: 1200 [I.V.:1200] Out: 25 [Blood:25]  BLOOD ADMINISTERED:none  DRAINS: none   LOCAL MEDICATIONS USED:  MARCAINE     SPECIMEN:  Source of Specimen:  right breast tissue and sentinel nodes X 2  DISPOSITION OF SPECIMEN:  PATHOLOGY  COUNTS:  YES  TOURNIQUET:  * No tourniquets in log *  DICTATION: .Dragon Dictation   After informed consent was obtained the patient was brought to the operating room and placed in the supine position on the operating room table. After adequate induction of general anesthesia the patient's right chest, breast, and axillary area were prepped with ChloraPrep, allowed to dry, and draped in usual sterile manner. An appropriate timeout was performed. Earlier in the day the patient underwent injection of 1 mCi of technetium sulfur colloid in the subareolar position on the right. Previously an I-125 seed was placed in the upper outer quadrant of the right breast to mark an area of ductal carcinoma in situ. The neoprobe was initially set to technetium. The right axilla was examined and there was a hot spot identified. A small transversely oriented incision was made with a 15 blade knife overlying the hot spot. Incision was carried through the skin and subcutaneous tissue sharply with electrocautery until the axilla was entered. Using the neoprobe to direct blunt hemostat dissection I was able to identify 2 lymph nodes. The first one was hot and was excised sharply with electrocautery. The lymphatics were controlled with clips. Ex vivo counts on this lymph  node were approximately 500. This was sent as sentinel node #1. The second node was also excised sharply with the electrocautery and the lymphatics were controlled with clips. It did not have any radioactivity but it was palpable. This was sent as sentinel node #2. There were no other hot or palpable lymph nodes in the right axilla. The wound was infiltrated with quarter percent Marcaine and irrigated with saline. The deep layer of the wound was closed with interrupted 3-0 Vicryl stitches. The skin was then closed with a running 4-0 Monocryl subcuticular stitch. Attention was then turned to the right breast. The neoprobe was set to I-125. The area of radioactivity was readily identified in the upper outer right breast. An elliptical incision was made in the skin overlying the radioactive seed. The incision was carried through the skin and subcutaneous tissue sharply with electrocautery. While checking the area of radioactivity frequently, A circular portion of breast tissue was excised sharply around the radioactive seed. The dissection was carried all the way to the muscle of the chest wall. Once the specimen was removed it was oriented with the appropriate paint colors. A specimen radiograph was obtained that showed the clip and seeds and calcifications in the center of the specimen. The specimen was then sent to pathology for further evaluation. Hemostasis was achieved using the Bovie electrocautery. The wound was infiltrated with quarter percent Marcaine and irrigated with saline. The deep layer of the wound was closed with layers of interrupted 3-0 Vicryl stitches. The skin was then closed with interrupted 4-0 Monocryl subcuticular  stitches. Dermabond dressings were applied. The patient tolerated the procedure well. At the end of the case all needle sponge and instrument counts were correct. The patient was then awakened and taken to recovery in stable condition.  PLAN OF CARE: Discharge to home after  PACU  PATIENT DISPOSITION:  PACU - hemodynamically stable.   Delay start of Pharmacological VTE agent (>24hrs) due to surgical blood loss or risk of bleeding: not applicable

## 2015-08-10 NOTE — Transfer of Care (Signed)
Immediate Anesthesia Transfer of Care Note  Patient: Carmen Duncan  Procedure(s) Performed: Procedure(s): BREAST LUMPECTOMY WITH RADIOACTIVE SEED AND SENTINEL LYMPH NODE BIOPSY (Right)  Patient Location: PACU  Anesthesia Type:General and GA combined with regional for post-op pain  Level of Consciousness: awake  Airway & Oxygen Therapy: Patient Spontanous Breathing and Patient connected to face mask oxygen  Post-op Assessment: Report given to RN and Post -op Vital signs reviewed and stable  Post vital signs: Reviewed and stable  Last Vitals:  Filed Vitals:   08/10/15 1050 08/10/15 1055  BP: 210/88 206/84  Pulse: 71 73  Temp:    Resp: 17 19    Complications: No apparent anesthesia complications

## 2015-08-10 NOTE — Anesthesia Postprocedure Evaluation (Signed)
Anesthesia Post Note  Patient: Carmen Duncan  Procedure(s) Performed: Procedure(s) (LRB): BREAST LUMPECTOMY WITH RADIOACTIVE SEED AND SENTINEL LYMPH NODE BIOPSY (Right)  Patient location during evaluation: PACU Anesthesia Type: General Level of consciousness: awake and alert Pain management: pain level controlled Vital Signs Assessment: post-procedure vital signs reviewed and stable Respiratory status: spontaneous breathing, nonlabored ventilation and respiratory function stable Cardiovascular status: blood pressure returned to baseline and stable Postop Assessment: no signs of nausea or vomiting Anesthetic complications: no    Last Vitals:  Filed Vitals:   08/10/15 1315 08/10/15 1441  BP: 172/77 167/81  Pulse: 75 80  Temp:  36.7 C  Resp: 13 16    Last Pain:  Filed Vitals:   08/10/15 1445  PainSc: 1                  Klee Kolek A

## 2015-08-10 NOTE — H&P (Signed)
Carmen Duncan  Location: [facility] Patient #: [id number] DOB: 10-19-1940 Undefined / Language: Cleophus Molt / Race: White Female   History of Present Illness  The patient is a 75 year old female who presents with breast cancer. We are asked to see the patient in consultation by Dr. Lisbeth Renshaw to evaluate her for right breast dcis. The patient is a 75yo wf who presents with calcifications in the upper outer quadrant of the right breast. These measured 2.7cm. They were biopsied and came back as high grade dcis. She is ER and PR negative. She denies any breast pain or discharge from the nipple. She does not take any hormone replacement.   Other Problems  Anxiety Disorder Arthritis Atrial Fibrillation Cerebrovascular Accident Cholelithiasis Gastroesophageal Reflux Disease Hemorrhoids High blood pressure Hypercholesterolemia Lump In Breast Melanoma Pulmonary Embolism / Blood Clot in Legs  Past Surgical History Breast Biopsy Right. Cataract Surgery Bilateral. Gallbladder Surgery - Laparoscopic Hysterectomy (not due to cancer) - Partial  Diagnostic Studies History Colonoscopy >10 years ago Mammogram within last year Pap Smear 1-5 years ago  Medication History No Current Medications Medications Reconciled  Social History Alcohol use Occasional alcohol use. Caffeine use Carbonated beverages, Coffee, Tea. No drug use Tobacco use Never smoker.  Family History Conni Slipper, RN; 07/26/2015 8:02 AM) Heart Disease Father, Mother. Hypertension Father, Mother. Respiratory Condition Mother.  Pregnancy / Birth History  Age at menarche 77 years. Contraceptive History Intrauterine device, Oral contraceptives. Gravida 1 Maternal age 51-25 Para 1    Review of Systems  General Not Present- Appetite Loss, Chills, Fatigue, Fever, Night Sweats, Weight Gain and Weight Loss. Skin Not Present- Change in Wart/Mole, Dryness, Hives, Jaundice, New  Lesions, Non-Healing Wounds, Rash and Ulcer. HEENT Present- Hearing Loss, Seasonal Allergies and Wears glasses/contact lenses. Not Present- Earache, Hoarseness, Nose Bleed, Oral Ulcers, Ringing in the Ears, Sinus Pain, Sore Throat, Visual Disturbances and Yellow Eyes. Respiratory Not Present- Bloody sputum, Chronic Cough, Difficulty Breathing, Snoring and Wheezing. Breast Not Present- Breast Mass, Breast Pain, Nipple Discharge and Skin Changes. Cardiovascular Not Present- Chest Pain, Difficulty Breathing Lying Down, Leg Cramps, Palpitations, Rapid Heart Rate, Shortness of Breath and Swelling of Extremities. Gastrointestinal Present- Hemorrhoids and Indigestion. Not Present- Abdominal Pain, Bloating, Bloody Stool, Change in Bowel Habits, Chronic diarrhea, Constipation, Difficulty Swallowing, Excessive gas, Gets full quickly at meals, Nausea, Rectal Pain and Vomiting. Female Genitourinary Not Present- Frequency, Nocturia, Painful Urination, Pelvic Pain and Urgency. Musculoskeletal Not Present- Back Pain, Joint Pain, Joint Stiffness, Muscle Pain, Muscle Weakness and Swelling of Extremities. Neurological Present- Decreased Memory and Tremor. Not Present- Fainting, Headaches, Numbness, Seizures, Tingling, Trouble walking and Weakness. Psychiatric Not Present- Anxiety, Bipolar, Change in Sleep Pattern, Depression, Fearful and Frequent crying. Endocrine Present- Cold Intolerance. Not Present- Excessive Hunger, Hair Changes, Heat Intolerance, Hot flashes and New Diabetes. Hematology Not Present- Easy Bruising, Excessive bleeding, Gland problems, HIV and Persistent Infections.   Physical Exam  General Mental Status-Alert. General Appearance-Consistent with stated age. Hydration-Well hydrated. Voice-Normal.  Head and Neck Head-normocephalic, atraumatic with no lesions or palpable masses. Trachea-midline. Thyroid Gland Characteristics - normal size and consistency.  Eye Eyeball -  Bilateral-Extraocular movements intact. Sclera/Conjunctiva - Bilateral-No scleral icterus.  Chest and Lung Exam Chest and lung exam reveals -quiet, even and easy respiratory effort with no use of accessory muscles and on auscultation, normal breath sounds, no adventitious sounds and normal vocal resonance. Inspection Chest Wall - Normal. Back - normal.  Breast Note: There is a palpable bruise in the upper  outer quadrant of the right breast. There is no palpable mass in the left breast. There is no palpable axillary, supraclavicular, or cervical lymphadenopathy.   Cardiovascular Cardiovascular examination reveals -normal heart sounds, regular rate and rhythm with no murmurs and normal pedal pulses bilaterally.  Abdomen Inspection Inspection of the abdomen reveals - No Hernias. Skin - Scar - no surgical scars. Palpation/Percussion Palpation and Percussion of the abdomen reveal - Soft, Non Tender, No Rebound tenderness, No Rigidity (guarding) and No hepatosplenomegaly. Auscultation Auscultation of the abdomen reveals - Bowel sounds normal.  Neurologic Neurologic evaluation reveals -alert and oriented x 3 with no impairment of recent or remote memory. Mental Status-Normal.  Musculoskeletal Normal Exam - Left-Upper Extremity Strength Normal and Lower Extremity Strength Normal. Normal Exam - Right-Upper Extremity Strength Normal and Lower Extremity Strength Normal.  Lymphatic Head & Neck  General Head & Neck Lymphatics: Bilateral - Description - Normal. Axillary  General Axillary Region: Bilateral - Description - Normal. Tenderness - Non Tender. Femoral & Inguinal  Generalized Femoral & Inguinal Lymphatics: Bilateral - Description - Normal. Tenderness - Non Tender.    Assessment & Plan  DUCTAL CARCINOMA IN SITU (DCIS) OF RIGHT BREAST (D05.11) Impression: The patient has a 2.7cm area of dcis in the upper outer quadrant of the right breast. I have discussed with  her in detail the different options for treatment and at this point she favors breast conservation. I will plan for a right breast radioactive seed localized lumpectomy and sentinel node mapping given its size and location. I have discussed with her the risks and benefits of the surgery as well as some of the technical aspects and she understands and wishes to proceed. Current Plans Pt Education - Breast Cancer: discussed with patient and provided information.   Signed by Luella Cook, MD

## 2015-08-11 ENCOUNTER — Encounter (HOSPITAL_BASED_OUTPATIENT_CLINIC_OR_DEPARTMENT_OTHER): Payer: Self-pay | Admitting: General Surgery

## 2015-08-17 ENCOUNTER — Other Ambulatory Visit: Payer: Self-pay | Admitting: Family Medicine

## 2015-08-24 NOTE — Progress Notes (Signed)
Location of Breast Cancer:Right upper Outer  Quadrant  Histology per Pathology Report: Diagnosis 07/17/15: Breast, right, needle core biopsy - DUCTAL CARCINOMA IN SITU WITH CALCIFICATIONS.- FIBROCYSTIC CHANGE WITH CALCIFICATIONS.  Receptor Status: ER(), PR (), Her2-neu (), Ki-()  Did patient present with symptoms (if so, please note symptoms) or was this found on screening mammography?: Routine mammogram screening   Past/Anticipated interventions by surgeon, if HSV:EXOGACGBK 08/10/15: Dr. Autumn Messing III,MD 1. Lymph node, sentinel, biopsy, right axillary ONE BENIGN LYMPH NODE (0/1) 2. Lymph node, sentinel, biopsy, right axillary ONE BENIGN LYMPH NODE (0/1) 1 of 3 FINAL for AELLA, RONDA T [phone]) Diagnosis(continued) 3. Breast, lumpectomy, right DUCTAL CARCINOMA IN SITU WITH COMEDO NECROSIS AND CALCIFICATION, GRADE 3, SPINNING 1 CM ALL MARGINS OF RESECTION ARE NEGATIVE FIBROCYSTIC CHANGES WITH CALCIFICATION  Dr. Marlou Starks seen patient and aspirated fluid from right axilla, f/u 1 month, not scheduled as yet  Microscopic Comment 3. BREAST, IN SITU CARCINOMA   Past/Anticipated interventions by medical oncology, if any: Chemotherapy : Dr. Heath Lark, seen in multidisciplinary clinic 07/26/15,   Lymphedema issues, if any: NO  Pain issues, if any:  Soreness, under axilla, seroma again,   SAFETY ISSUES: YES  Prior radiation? NO  Pacemaker/ICD? NO  Possible current pregnancy? N/A  Is the patient on methotrexate? NO  Current Complaints / other details: Widowed,  Lives with son [place],  who works for [employer]; Therapist, occupational; Menarche age 75,[id number] 1st live birth age age 63, HRT some, and oral contraceptives not sure how long,   Non smoker, no smokeless tobacco, drinks wine 3x week, no illicit drug use     Allergies: Latex=rash,itch,redness , Sulfonamide derivitives= Rash BP 107/86 mmHg  Pulse 88  Temp(Src) 97.4 F (36.3 C) (Oral)  Resp 20  Ht _0  (1.549 m)  Wt 122  lb 12.8 oz (55.702 kg)  BMI 23.21 kg/m2  Wt Readings from Last 3 Encounters:  08/28/15 122 lb 12.8 oz (55.702 kg)  08/10/15 120 lb (54.432 kg)  07/26/15 121 lb 14.4 oz (55.293 kg)   Rebecca Eaton, RN 08/24/2015,10:30 AM  [phone]

## 2015-08-25 ENCOUNTER — Encounter: Payer: Self-pay | Admitting: Family Medicine

## 2015-08-25 DIAGNOSIS — L245 Irritant contact dermatitis due to other chemical products: Secondary | ICD-10-CM | POA: Diagnosis not present

## 2015-08-25 DIAGNOSIS — L570 Actinic keratosis: Secondary | ICD-10-CM | POA: Diagnosis not present

## 2015-08-28 ENCOUNTER — Ambulatory Visit
Admission: RE | Admit: 2015-08-28 | Discharge: 2015-08-28 | Disposition: A | Discharge status: Home / Self Care | Payer: Medicare Other | Source: Ambulatory Visit | Attending: Radiation Oncology | Admitting: Radiation Oncology

## 2015-08-28 ENCOUNTER — Encounter: Payer: Self-pay | Admitting: Radiation Oncology

## 2015-08-28 VITALS — BP 107/86 | HR 88 | Temp 97.4°F | Resp 20 | Ht 61.0 in | Wt 122.8 lb

## 2015-08-28 DIAGNOSIS — Z171 Estrogen receptor negative status [ER-]: Secondary | ICD-10-CM | POA: Insufficient documentation

## 2015-08-28 DIAGNOSIS — C50411 Malignant neoplasm of upper-outer quadrant of right female breast: Secondary | ICD-10-CM | POA: Diagnosis not present

## 2015-08-28 DIAGNOSIS — Z510 Encounter for antineoplastic radiation therapy: Secondary | ICD-10-CM | POA: Insufficient documentation

## 2015-08-28 NOTE — Progress Notes (Signed)
Please see the Nurse Progress Note in the MD Initial Consult Encounter for this patient. 

## 2015-08-28 NOTE — Progress Notes (Signed)
Radiation Oncology         [phone] ________________________________  Name: Carmen Duncan MRN: [id number]  Date: 08/28/2015  DOB: 1940-07-19  Follow-Up Visit Note  CC: [name], MD  Autumn Messing III, MD  Diagnosis: Breast cancer of upper-outer quadrant of right female breast [facility])   Staging form: Breast, AJCC 7th Edition     Clinical stage from 07/26/2015: Stage 0 (Tis (DCIS), N0, M0) - Unsigned       Staging comments: Staged at breast conference on 3.1.17  Narrative:  The patient returns today for routine follow-up.  The patient was originally seen in multidisciplinary clinic on 07/26/15. She was felt to be a good candidate for breast conservation treatment. In on 08/10/2015 underwent a right lumpectomy and right sentinel lymph node excision. Final pathology revealed a pTis, pN0 tumor with negative margins (grade 3 DCIS with comedo necrosis and calcification, spanning 1 cm, ER/PR negative). Two, right axillary, sentinel lymph nodes biopsied were negative. The patient developed a seroma in the right axillary region and this was drained by Dr. Marlou Starks. She is concerned that this is returned.  On review of systems, the patient reports that she is doing pretty well overall, but is concerned about the fluid. She states that this is sore regardless of what she wears, even just a night gown. She denies any drainage or redness of her skin. She denies any difficulty with upper extremity edema. She is not experiencing any fevers or chills chest pain or shortness of breath. She denies any abdominal pain nausea or vomiting, bowel or bladder dysfunction. A complete review of systems is obtained and is otherwise negative.   ALLERGIES:  is allergic to latex and sulfonamide derivatives.  Meds: Current Outpatient Prescriptions  Medication Sig Dispense Refill  . acetaminophen (TYLENOL) 325 MG tablet Take 650 mg by mouth every 6 (six) hours as needed for headache.    [name] atorvastatin (LIPITOR) 20 MG tablet  TAKE ONE TABLET BY MOUTH ONCE DAILY 30 tablet 11  . clidinium-chlordiazePOXIDE (LIBRAX) 5-2.5 MG capsule Take 1 capsule by mouth 2 (two) times daily as needed. 60 capsule 3  . clopidogrel (PLAVIX) 75 MG tablet Take 1 tablet (75 mg total) by mouth daily. 30 tablet 5  . pantoprazole (PROTONIX) 40 MG tablet TAKE ONE TABLET BY MOUTH ONCE DAILY 30 tablet 5  . propranolol (INDERAL) 20 MG tablet Take 1 tablet (20 mg total) by mouth daily. 30 tablet 6  . oxyCODONE-acetaminophen (ROXICET) 5-325 MG tablet Take 1-2 tablets by mouth every 4 (four) hours as needed. (Patient not taking: Reported on 08/28/2015) 30 tablet 0  . Probiotic Product (ACIDOPHILUS) 90-25 MG CHEW Chew by mouth. Reported on 08/28/2015     No current facility-administered medications for this encounter.    Physical Findings:  height is 5\' 1"  (1.549 m) and weight is 122 lb 12.8 oz (55.702 kg). Her oral temperature is 97.4 F (36.3 C). Her blood pressure is 107/86 and her pulse is 88. Her respiration is 20.  In general this is a well appearing Caucasian female in no acute distress. She's alert and oriented x4 and appropriate throughout the examination. Cardiopulmonary assessment is negative for acute distress and she exhibits normal effort. Her right breast and axilla are examined. Along the right axillary region, along the line of her sentinel mapping and excision, she does have an approximately 5 cm area of fluid accumulation beneath the skin that is tender to palpation. No erythema is noted of the skin. There is  also some fluid accumulation along the medial aspect of her incision line at the lumpectomy scar that is also tender to palpation and is approximately 2.5 cm in greatest dimension. No induration or nodularity is appreciated.     Lab Findings: Lab Results  Component Value Date   WBC 5.7 07/26/2015   HGB 13.7 07/26/2015   HCT 41.4 07/26/2015   MCV 88.1 07/26/2015   PLT 217 07/26/2015     Radiographic Findings: Nm Sentinel  Node Inj-no Rpt (breast)  08/10/2015  CLINICAL DATA: right breast dcis Sulfur colloid was injected intradermally by the nuclear medicine technologist for breast cancer sentinel node localization.    Impression/Plan:   1. Right ductal carcinoma in situ. The patient appears to be healing from her surgery despite the seromatous changes. We will set her up for a follow-up visit with Dr. Marlou Starks tomorrow at 1110. I contacted his office ahead of time to let him know R spell possibly needing to have this aspirated again. We would like for this issue to be resolved prior to moving forward with simulation and subsequent radiation treatment, and we'll set her up for simulation on 09/15/2015. I will touch base with her by phone on the 18th to confirm that this has improved in that it would be safe enough to move forward with her planned simulation, as to avoid any alterations in her radiation regimen. Dr. Lisbeth Renshaw discusses with the patient the rationale for moving forward with hyperfractionated treatment, and outlines a total of 20 treatment to the right breast. The risks, benefits, long and short term side effects are reviewed with the patient regarding radiotherapy, and she is interested in moving forward. Written consent is obtained. 2. Postop seroma. The patient will be seeing Dr. Marlou Starks tomorrow at 11:00 as above. We appreciate his suggestion and recommendations moving forward.   Carola Rhine, PAC  This document serves as a record of services personally performed by Shona Simpson, PA-C and Kyung Rudd, MD. It was created on their behalf by Darcus Austin, a trained medical scribe. The creation of this record is based on the scribe's personal observations and the provider's statements to them. This document has been checked and approved by the attending provider.

## 2015-09-01 ENCOUNTER — Encounter (HOSPITAL_BASED_OUTPATIENT_CLINIC_OR_DEPARTMENT_OTHER): Payer: Self-pay | Admitting: General Surgery

## 2015-09-01 ENCOUNTER — Telehealth: Payer: Self-pay | Admitting: *Deleted

## 2015-09-01 ENCOUNTER — Inpatient Hospital Stay: Admission: RE | Admit: 2015-09-01 | Payer: Medicare Other | Source: Ambulatory Visit

## 2015-09-01 NOTE — Telephone Encounter (Signed)
Called patient  Home, spoke with daughter [name], patient was out and about, [name] went with the patient to see Dr. Marlou Starks and 10cc fluid was aspirated, follow up appt is for next Tuesday, , she thanked this RN for checking on her mother and weill keep [place] informed 10:27 AM

## 2015-09-01 NOTE — Telephone Encounter (Signed)
Will call patient later this am per Shona Simpson, PA=C request,regarding patient's(Carmen Duncan) visit with Dr. Johnnette Barrios on Tuesday . 6:43 AM

## 2015-09-04 ENCOUNTER — Ambulatory Visit (INDEPENDENT_AMBULATORY_CARE_PROVIDER_SITE_OTHER)
Admission: RE | Admit: 2015-09-04 | Discharge: 2015-09-04 | Disposition: A | Discharge status: Home / Self Care | Payer: Medicare Other | Source: Ambulatory Visit | Attending: Pulmonary Disease | Admitting: Pulmonary Disease

## 2015-09-04 DIAGNOSIS — J8410 Pulmonary fibrosis, unspecified: Secondary | ICD-10-CM

## 2015-09-05 ENCOUNTER — Encounter: Payer: Self-pay | Admitting: *Deleted

## 2015-09-05 NOTE — Progress Notes (Signed)
[place] Psychosocial Distress Screening Clinical Social Work  Clinical Social Work was referred by distress screening protocol.  The patient scored a 5 on the Psychosocial Distress Thermometer which indicates moderate distress. Clinical Social Worker phoned pt to assess for distress and other psychosocial needs. CSW phoned pt on her preferred number and left supportive message with CSW information, encouraging pt to return CSW call.   [facility] DISTRESS SCREENING 08/28/2015  Screening Type   Distress experienced in past week (1-10) 5  Emotional problem type Nervousness/Anxiety;Adjusting to illness  Physical Problem type Tingling hands/feet  Physician notified of physical symptoms Yes  Referral to clinical social work Yes  Referral to support programs     Clinical Social Worker follow up needed: Yes.    If yes, follow up plan: See above Loren Racer, [place] Worker [place]  [facility] Phone: [phone] Fax: [phone]

## 2015-09-13 ENCOUNTER — Telehealth: Payer: Self-pay | Admitting: Radiation Oncology

## 2015-09-13 NOTE — Telephone Encounter (Addendum)
I called the patient to check on her and since her seroma was drained, she does not feel as though this has re-accumulated, but welcomes Dr. Lisbeth Renshaw to look at this during her sim on Friday.

## 2015-09-15 ENCOUNTER — Other Ambulatory Visit: Payer: Self-pay | Admitting: Oncology

## 2015-09-15 ENCOUNTER — Ambulatory Visit
Admission: RE | Admit: 2015-09-15 | Discharge: 2015-09-15 | Disposition: A | Discharge status: Home / Self Care | Payer: Medicare Other | Source: Ambulatory Visit | Attending: Radiation Oncology | Admitting: Radiation Oncology

## 2015-09-15 DIAGNOSIS — C50411 Malignant neoplasm of upper-outer quadrant of right female breast: Secondary | ICD-10-CM

## 2015-09-15 DIAGNOSIS — Z510 Encounter for antineoplastic radiation therapy: Secondary | ICD-10-CM | POA: Diagnosis not present

## 2015-09-18 ENCOUNTER — Telehealth: Payer: Self-pay | Admitting: Oncology

## 2015-09-18 NOTE — Telephone Encounter (Signed)
s.w. pt and advised on JUne appt....pt ok a;nd aware °

## 2015-09-19 NOTE — Progress Notes (Signed)
  Radiation Oncology         [phone] ________________________________  Name: Carmen Duncan MRN: [id number]  Date: 09/15/2015  DOB: 1941-03-24   DIAGNOSIS:     ICD-9-CM ICD-10-CM   1. Breast cancer of upper-outer quadrant of right female breast ([place]) 174.4 C50.411     SIMULATION AND TREATMENT PLANNING NOTE  The patient presented for simulation prior to beginning her course of radiation treatment for her diagnosis of Right-sided breast cancer. The patient was placed in a supine position on a breast board. A customized vac-lock bag was constructed and this complex treatment device will be used on a daily basis during her treatment. In this fashion, a CT scan was obtained through the chest area and an isocenter was placed near the chest wall within the breast.  The patient will be planned to receive a course of radiation initially to a dose of 42.5 Gy. This will consist of a whole breast radiotherapy technique. To accomplish this, 2 customized blocks have been designed which will correspond to medial and lateral whole breast tangent fields. This treatment will be accomplished at 2.5 Gy per fraction. A forward planning technique will also be evaluated to determine if this approach improves the plan. It is anticipated that the patient will then receive a 7.5 Gy boost to the seroma cavity which has been contoured. This will be accomplished at 2.5 Gy per fraction.   This initial treatment will consist of a 3-D conformal technique. The seroma has been contoured as the primary target structure. Additionally, dose volume histograms of both this target as well as the lungs and heart will also be evaluated. Such an approach is necessary to ensure that the target area is adequately covered while the nearby critical  normal structures are adequately spared.  Plan:  The final anticipated total dose therefore will correspond to 50 Gy.    _______________________________   Jodelle Gross, MD, PhD

## 2015-09-19 NOTE — Progress Notes (Signed)
  Radiation Oncology         [phone] ________________________________  Name: Carmen Duncan MRN: [id number]  Date: 09/15/2015  DOB: 19-Apr-1941  Optical Surface Tracking Plan:  Since intensity modulated radiotherapy (IMRT) and 3D conformal radiation treatment methods are predicated on accurate and precise positioning for treatment, intrafraction motion monitoring is medically necessary to ensure accurate and safe treatment delivery.  The ability to quantify intrafraction motion without excessive ionizing radiation dose can only be performed with optical surface tracking. Accordingly, surface imaging offers the opportunity to obtain 3D measurements of patient position throughout IMRT and 3D treatments without excessive radiation exposure.  I am ordering optical surface tracking for this patient's upcoming course of radiotherapy. ________________________________  Carmen Rudd, MD 09/19/2015 9:08 AM    Reference:   Carmen Duncan, et al. Surface imaging-based analysis of intrafraction motion for breast radiotherapy patients.Journal of [place], n. 6, nov. 2014. ISSN [id number].   Available at: <http://www.jacmp.org/index.php/jacmp/article/view/4957>.

## 2015-09-20 ENCOUNTER — Telehealth: Payer: Self-pay | Admitting: Radiation Oncology

## 2015-09-20 DIAGNOSIS — Z510 Encounter for antineoplastic radiation therapy: Secondary | ICD-10-CM | POA: Diagnosis not present

## 2015-09-20 NOTE — Telephone Encounter (Signed)
LM with pt's daughter in law to ask the patient to call me back about postpoing her treatment to Thursday next week rather than Monday.

## 2015-09-22 ENCOUNTER — Ambulatory Visit: Payer: Medicare Other | Admitting: Radiation Oncology

## 2015-09-22 ENCOUNTER — Other Ambulatory Visit: Payer: Self-pay | Admitting: Neurology

## 2015-09-22 ENCOUNTER — Telehealth: Payer: Self-pay | Admitting: Radiation Oncology

## 2015-09-22 MED ORDER — CLOPIDOGREL BISULFATE 75 MG PO TABS: 75.0000 mg | ORAL_TABLET | Freq: Every day | ORAL | Status: DC

## 2015-09-22 NOTE — Telephone Encounter (Signed)
Plavix refill requested. Per last office note- patient to remain on medication. Refill approved and sent to patient's pharmacy.   

## 2015-09-22 NOTE — Telephone Encounter (Addendum)
The patient called morning to confirm her time for her treatment. We confirmed this based on the schedule in [place].

## 2015-09-25 ENCOUNTER — Ambulatory Visit: Payer: Medicare Other

## 2015-09-26 ENCOUNTER — Ambulatory Visit: Payer: Medicare Other

## 2015-09-27 ENCOUNTER — Telehealth: Payer: Self-pay | Admitting: Radiation Oncology

## 2015-09-27 ENCOUNTER — Ambulatory Visit: Payer: Medicare Other

## 2015-09-27 NOTE — Telephone Encounter (Signed)
I spoke with the patient about her CT of the chest after this was presented at the breast conference today. The recommendation was to repeat films in 3-4 months. We will coordinate this once she completes radiotherapy.

## 2015-09-28 ENCOUNTER — Other Ambulatory Visit: Payer: Self-pay | Admitting: Oncology

## 2015-09-28 ENCOUNTER — Ambulatory Visit: Payer: Medicare Other

## 2015-09-28 ENCOUNTER — Ambulatory Visit
Admission: RE | Admit: 2015-09-28 | Discharge: 2015-09-28 | Disposition: A | Discharge status: Home / Self Care | Payer: Medicare Other | Source: Ambulatory Visit | Attending: Radiation Oncology | Admitting: Radiation Oncology

## 2015-09-28 DIAGNOSIS — Z510 Encounter for antineoplastic radiation therapy: Secondary | ICD-10-CM | POA: Diagnosis not present

## 2015-09-28 DIAGNOSIS — C50411 Malignant neoplasm of upper-outer quadrant of right female breast: Secondary | ICD-10-CM | POA: Diagnosis not present

## 2015-09-29 ENCOUNTER — Ambulatory Visit
Admission: RE | Admit: 2015-09-29 | Discharge: 2015-09-29 | Disposition: A | Discharge status: Home / Self Care | Payer: Medicare Other | Source: Ambulatory Visit | Attending: Radiation Oncology | Admitting: Radiation Oncology

## 2015-09-29 VITALS — BP 150/74 | HR 76 | Temp 97.8°F | Resp 10 | Wt 127.4 lb

## 2015-09-29 DIAGNOSIS — C50411 Malignant neoplasm of upper-outer quadrant of right female breast: Secondary | ICD-10-CM | POA: Diagnosis not present

## 2015-09-29 DIAGNOSIS — Z510 Encounter for antineoplastic radiation therapy: Secondary | ICD-10-CM | POA: Diagnosis not present

## 2015-09-29 MED ORDER — RADIAPLEXRX EX GEL
Freq: Once | CUTANEOUS | Status: AC
  Administered 2015-09-29: 13:00:00 via TOPICAL

## 2015-09-29 MED ORDER — ALRA NON-METALLIC DEODORANT (RAD-ONC)
1.0000 "application " | Freq: Once | TOPICAL | Status: AC
  Administered 2015-09-29: 1 via TOPICAL

## 2015-09-29 NOTE — Progress Notes (Signed)
PAIN: She is currently in no pain.  SKIN: Pt right breast- warm dry and intact.  Pt denies edema.  Pt given Radiaplex  OTHER: No complaints BP 150/74 mmHg  Pulse 76  Temp(Src) 97.8 F (36.6 C) (Oral)  Resp 10  Wt 127 lb 6.4 oz (57.788 kg)  SpO2 100% Wt Readings from Last 3 Encounters:  09/29/15 127 lb 6.4 oz (57.788 kg)  08/28/15 122 lb 12.8 oz (55.702 kg)  08/10/15 120 lb (54.432 kg)

## 2015-09-29 NOTE — Progress Notes (Signed)
Radiation education, gave booklet, radiaplex alra, discussed ways to manage side effects, skin irritation, fatiggue, swelling breast,soreness,dryness, apply radiplex after rad tx and bedtime daily, increase protein in diet, stay hydrated,drink plenty water,fluids, teach back given, use an electric razor 1:16 PM

## 2015-10-02 ENCOUNTER — Ambulatory Visit
Admission: RE | Admit: 2015-10-02 | Discharge: 2015-10-02 | Disposition: A | Discharge status: Home / Self Care | Payer: Medicare Other | Source: Ambulatory Visit | Attending: Radiation Oncology | Admitting: Radiation Oncology

## 2015-10-02 DIAGNOSIS — Z510 Encounter for antineoplastic radiation therapy: Secondary | ICD-10-CM | POA: Diagnosis not present

## 2015-10-02 NOTE — Progress Notes (Signed)
   Department of Radiation Oncology  Phone:  [phone] Fax:        [phone]  Weekly Treatment Note    Name: Carmen Duncan Date: 10/02/2015 MRN: [id number] DOB: January 29, 1941   Diagnosis:     ICD-9-CM ICD-10-CM   1. Breast cancer of upper-outer quadrant of right female breast ([facility]) 174.4 C50.411 hyaluronate sodium (RADIAPLEXRX) gel     non-metallic deodorant (ALRA) 1 application     Current dose: 5 Gy  Current fraction: 2   MEDICATIONS: Current Outpatient Prescriptions  Medication Sig Dispense Refill  . acetaminophen (TYLENOL) 325 MG tablet Take 650 mg by mouth every 6 (six) hours as needed for headache.    [name] atorvastatin (LIPITOR) 20 MG tablet TAKE ONE TABLET BY MOUTH ONCE DAILY 30 tablet 11  . clidinium-chlordiazePOXIDE (LIBRAX) 5-2.5 MG capsule Take 1 capsule by mouth 2 (two) times daily as needed. 60 capsule 3  . clopidogrel (PLAVIX) 75 MG tablet Take 1 tablet (75 mg total) by mouth daily. 30 tablet 5  . oxyCODONE-acetaminophen (ROXICET) 5-325 MG tablet Take 1-2 tablets by mouth every 4 (four) hours as needed. (Patient not taking: Reported on 08/28/2015) 30 tablet 0  . pantoprazole (PROTONIX) 40 MG tablet TAKE ONE TABLET BY MOUTH ONCE DAILY 30 tablet 5  . Probiotic Product (ACIDOPHILUS) 90-25 MG CHEW Chew by mouth. Reported on 08/28/2015    . propranolol (INDERAL) 20 MG tablet Take 1 tablet (20 mg total) by mouth daily. 30 tablet 6   No current facility-administered medications for this encounter.     ALLERGIES: Latex and Sulfonamide derivatives   LABORATORY DATA:  Lab Results  Component Value Date   WBC 5.7 07/26/2015   HGB 13.7 07/26/2015   HCT 41.4 07/26/2015   MCV 88.1 07/26/2015   PLT 217 07/26/2015   Lab Results  Component Value Date   NA 139 07/26/2015   K 4.8 07/26/2015   CL 105 01/04/2015   CO2 28 07/26/2015   Lab Results  Component Value Date   ALT 13 07/26/2015   AST 20 07/26/2015   ALKPHOS 79 07/26/2015   BILITOT 0.37 07/26/2015      NARRATIVE: Carmen Duncan was seen today for weekly treatment management. The chart was checked and the patient's films were reviewed.  PAIN: She is currently in no pain.  SKIN: Pt right breast- warm dry and intact.  Pt denies edema.  Pt given Radiaplex  OTHER: No complaints BP 150/74 mmHg  Pulse 76  Temp(Src) 97.8 F (36.6 C) (Oral)  Resp 10  Wt 127 lb 6.4 oz (57.788 kg)  SpO2 100% Wt Readings from Last 3 Encounters:  09/29/15 127 lb 6.4 oz (57.788 kg)  08/28/15 122 lb 12.8 oz (55.702 kg)  08/10/15 120 lb (54.432 kg)     PHYSICAL EXAMINATION: weight is 127 lb 6.4 oz (57.788 kg). Her oral temperature is 97.8 F (36.6 C). Her blood pressure is 150/74 and her pulse is 76. Her respiration is 10 and oxygen saturation is 100%.        ASSESSMENT: The patient is doing satisfactorily with treatment.  PLAN: We will continue with the patient's radiation treatment as planned.

## 2015-10-03 ENCOUNTER — Ambulatory Visit
Admission: RE | Admit: 2015-10-03 | Discharge: 2015-10-03 | Disposition: A | Discharge status: Home / Self Care | Payer: Medicare Other | Source: Ambulatory Visit | Attending: Radiation Oncology | Admitting: Radiation Oncology

## 2015-10-03 DIAGNOSIS — Z510 Encounter for antineoplastic radiation therapy: Secondary | ICD-10-CM | POA: Diagnosis not present

## 2015-10-04 ENCOUNTER — Ambulatory Visit
Admission: RE | Admit: 2015-10-04 | Discharge: 2015-10-04 | Disposition: A | Discharge status: Home / Self Care | Payer: Medicare Other | Source: Ambulatory Visit | Attending: Radiation Oncology | Admitting: Radiation Oncology

## 2015-10-04 DIAGNOSIS — Z510 Encounter for antineoplastic radiation therapy: Secondary | ICD-10-CM | POA: Diagnosis not present

## 2015-10-05 ENCOUNTER — Ambulatory Visit
Admission: RE | Admit: 2015-10-05 | Discharge: 2015-10-05 | Disposition: A | Discharge status: Home / Self Care | Payer: Medicare Other | Source: Ambulatory Visit | Attending: Radiation Oncology | Admitting: Radiation Oncology

## 2015-10-05 DIAGNOSIS — Z510 Encounter for antineoplastic radiation therapy: Secondary | ICD-10-CM | POA: Diagnosis not present

## 2015-10-06 ENCOUNTER — Ambulatory Visit
Admission: RE | Admit: 2015-10-06 | Discharge: 2015-10-06 | Disposition: A | Discharge status: Home / Self Care | Payer: Medicare Other | Source: Ambulatory Visit | Attending: Radiation Oncology | Admitting: Radiation Oncology

## 2015-10-06 ENCOUNTER — Ambulatory Visit: Payer: Medicare Other | Admitting: Neurology

## 2015-10-06 DIAGNOSIS — Z510 Encounter for antineoplastic radiation therapy: Secondary | ICD-10-CM | POA: Diagnosis not present

## 2015-10-06 DIAGNOSIS — C50411 Malignant neoplasm of upper-outer quadrant of right female breast: Secondary | ICD-10-CM

## 2015-10-06 MED ORDER — CEPHALEXIN 500 MG PO CAPS: 500.0000 mg | ORAL_CAPSULE | Freq: Three times a day (TID) | ORAL | Status: DC

## 2015-10-06 NOTE — Progress Notes (Signed)
The patient was seen today for an under treatment visit for her radiation to the right breast. The patient did develop a postoperative seroma and has had this drained in the office with Dr. Marlou Starks on 3 occasions. She has developed increasing swelling, discomfort, and erythema of the lumpectomy incision site in the last week. She denies any fevers or chills. She denies any drainage from this site.   On exam the skin is erythematous with more palpable fullness of the surgical cavity that is about 5cm in greatest dimension. The skin is warm to the touch and blanches. She has tenderness with palpation.  After discussing these findings, we are concerned that this may represent an infected seroma. I have written a prescription for Keflex 500mg  one tab po TID for 10 days. She will keep [place] informed of any worsening symptoms, but I am concerned that she has episodes of forgetfulness and possible early dementia and may forget to keep [place] informed of her symptoms. I will check on her Monday at the treatment machine to ensure this is improving with antibiotics.     Carola Rhine, PAC

## 2015-10-06 NOTE — Progress Notes (Addendum)
   Department of Radiation Oncology  Phone:  [phone] Fax:        [phone]  Weekly Treatment Note    Name: Carmen Duncan Date: 10/06/2015 MRN: [id number] DOB: 08-10-1940   Diagnosis:     ICD-9-CM ICD-10-CM   1. Breast cancer of upper-outer quadrant of right female breast ([place]) 174.4 C50.411      Current dose: 17.5 Gy  Current fraction: 7   MEDICATIONS: Current Outpatient Prescriptions  Medication Sig Dispense Refill  . acetaminophen (TYLENOL) 325 MG tablet Take 650 mg by mouth every 6 (six) hours as needed for headache.    [name] atorvastatin (LIPITOR) 20 MG tablet TAKE ONE TABLET BY MOUTH ONCE DAILY 30 tablet 11  . cephALEXin (KEFLEX) 500 MG capsule Take 1 capsule (500 mg total) by mouth 3 (three) times daily. 30 capsule 0  . clidinium-chlordiazePOXIDE (LIBRAX) 5-2.5 MG capsule Take 1 capsule by mouth 2 (two) times daily as needed. 60 capsule 3  . clopidogrel (PLAVIX) 75 MG tablet Take 1 tablet (75 mg total) by mouth daily. 30 tablet 5  . oxyCODONE-acetaminophen (ROXICET) 5-325 MG tablet Take 1-2 tablets by mouth every 4 (four) hours as needed. (Patient not taking: Reported on 08/28/2015) 30 tablet 0  . pantoprazole (PROTONIX) 40 MG tablet TAKE ONE TABLET BY MOUTH ONCE DAILY 30 tablet 5  . Probiotic Product (ACIDOPHILUS) 90-25 MG CHEW Chew by mouth. Reported on 08/28/2015    . propranolol (INDERAL) 20 MG tablet Take 1 tablet (20 mg total) by mouth daily. 30 tablet 6   No current facility-administered medications for this encounter.     ALLERGIES: Latex and Sulfonamide derivatives   LABORATORY DATA:  Lab Results  Component Value Date   WBC 5.7 07/26/2015   HGB 13.7 07/26/2015   HCT 41.4 07/26/2015   MCV 88.1 07/26/2015   PLT 217 07/26/2015   Lab Results  Component Value Date   NA 139 07/26/2015   K 4.8 07/26/2015   CL 105 01/04/2015   CO2 28 07/26/2015   Lab Results  Component Value Date   ALT 13 07/26/2015   AST 20 07/26/2015   ALKPHOS 79 07/26/2015   BILITOT 0.37 07/26/2015     NARRATIVE: Carmen Duncan was seen today for weekly treatment management. The chart was checked and the patient's films were reviewed.  The patient complains of discomfort in the right breast with the development of some redness over the last week. Some swelling has been noticed on the treatment machines associated with this.  PHYSICAL EXAMINATION: vitals were not taken for this visit.     Erythema centrally in the breast with swelling and some warmth.  ASSESSMENT: The patient is doing satisfactorily with treatment. The patient appears to have an infection in the right breast. We discussed management of this with antibiotics and we will continue to follow this area.  PLAN: We will continue with the patient's radiation treatment as planned. The patient has been called in a prescription for Keflex.

## 2015-10-06 NOTE — Addendum Note (Signed)
Encounter addended by: Kyung Rudd, MD on: 10/06/2015  6:11 PM<BR>     Documentation filed: Notes Section

## 2015-10-09 ENCOUNTER — Ambulatory Visit
Admission: RE | Admit: 2015-10-09 | Discharge: 2015-10-09 | Disposition: A | Discharge status: Home / Self Care | Payer: Medicare Other | Source: Ambulatory Visit | Attending: Radiation Oncology | Admitting: Radiation Oncology

## 2015-10-09 ENCOUNTER — Telehealth: Payer: Self-pay | Admitting: Pulmonary Disease

## 2015-10-09 ENCOUNTER — Ambulatory Visit: Payer: Medicare Other | Admitting: Pulmonary Disease

## 2015-10-09 DIAGNOSIS — Z510 Encounter for antineoplastic radiation therapy: Secondary | ICD-10-CM | POA: Diagnosis not present

## 2015-10-09 DIAGNOSIS — C50411 Malignant neoplasm of upper-outer quadrant of right female breast: Secondary | ICD-10-CM | POA: Diagnosis not present

## 2015-10-09 NOTE — Telephone Encounter (Signed)
Pt rescheduled for appt with RA 10/10/15 @ 9:30 Nothing further needed.

## 2015-10-10 ENCOUNTER — Encounter: Payer: Self-pay | Admitting: Pulmonary Disease

## 2015-10-10 ENCOUNTER — Encounter: Payer: Self-pay | Admitting: Radiation Oncology

## 2015-10-10 ENCOUNTER — Other Ambulatory Visit: Payer: Self-pay | Admitting: Radiation Oncology

## 2015-10-10 ENCOUNTER — Telehealth: Payer: Self-pay | Admitting: Pulmonary Disease

## 2015-10-10 ENCOUNTER — Telehealth: Payer: Self-pay | Admitting: *Deleted

## 2015-10-10 ENCOUNTER — Ambulatory Visit
Admission: RE | Admit: 2015-10-10 | Discharge: 2015-10-10 | Disposition: A | Discharge status: Home / Self Care | Payer: Medicare Other | Source: Ambulatory Visit | Attending: Radiation Oncology | Admitting: Radiation Oncology

## 2015-10-10 ENCOUNTER — Ambulatory Visit (INDEPENDENT_AMBULATORY_CARE_PROVIDER_SITE_OTHER): Payer: Medicare Other | Admitting: Pulmonary Disease

## 2015-10-10 VITALS — BP 122/84 | HR 79 | Ht 61.0 in | Wt 126.6 lb

## 2015-10-10 DIAGNOSIS — J84112 Idiopathic pulmonary fibrosis: Secondary | ICD-10-CM

## 2015-10-10 DIAGNOSIS — Z510 Encounter for antineoplastic radiation therapy: Secondary | ICD-10-CM | POA: Diagnosis not present

## 2015-10-10 DIAGNOSIS — C50411 Malignant neoplasm of upper-outer quadrant of right female breast: Secondary | ICD-10-CM | POA: Diagnosis not present

## 2015-10-10 MED ORDER — DOXYCYCLINE HYCLATE 100 MG PO TABS: 100.0000 mg | ORAL_TABLET | Freq: Two times a day (BID) | ORAL | Status: DC

## 2015-10-10 NOTE — Telephone Encounter (Signed)
lmtcb X1 for pt's son Carmen Duncan

## 2015-10-10 NOTE — Progress Notes (Signed)
Weekly Management Note Current Dose: 22.5  Gy  Projected Dose: 42.5 Gy   Narrative:  The patient presents for routine under treatment assessment.  CBCT/MVCT images/Port film x-rays were reviewed.  The chart was checked. Asked to see patient at treatment machine by therapists.  Fraser Din has been on abx since Friday for infection. States breast is slightly better. No fevers or chills.   Physical Findings: Bright pink breast. Warm to touch.   Impression:  The patient is tolerating radiation with pink breast minimally improved on Keflex.   Plan:  Continue treatment as planned. Switch to doxycycline.  Will call in to [facility].

## 2015-10-10 NOTE — Assessment & Plan Note (Signed)
pulmonary fibrosis with seems to be slightly worse from 2016 We will start you on [name] twice daily We discussed side effects of nausea and diarrhea and the need to monitor liver tests by  blood work every few months

## 2015-10-10 NOTE — Progress Notes (Signed)
   Subjective:    Patient ID: Carmen Duncan, female    DOB: 04-29-1941, 75 y.o.   MRN: [id number]  HPI  75yo never smoker with asthma, HTNfor follow-up of interstitial lung disease  Her son [name] works for [employer] 03/2014 Seen for pulmonary consult during hospitalization for CAP for non-resolving infiltrates &  dyspnea on exertion for several months  history of Raynaud phenomenon She was treated with IV steroids and discharged on a slow prednisone taper x 4 months and Stopped by end of March 2016  10/10/2015  Chief Complaint  Patient presents with  . Follow-up    breathing has been doing okay. Currently doing radiation treatment for breast cancer. very fatigued.    45mFU  Unfortunately she was diagnosed with breast cancer, underwent lumpectomy and radiation is ongoing-she feels tired daily She does not feel that her dyspnea is worse, no pedal edema cough or chest colds   HRCT was reviewed and appears worse  Desaturates to 91% on walking but recovers on resting  Significant tests/ events  CXR reviewed- bibasal infiltrates seem to have improved from 04/06/14, these are new when compared to old chest x-ray from 2011. 03/2014 HRCT scan -showed peripheral patchy consolidation bilaterally-infection versus COP  Swallow eval neg for aspiration  03/2014 echo >> grade 2 diastolic dysfunction with fluid overload and elevated BNP  09/2014 HRCT chest - resolution of aspdz  Peripheral and basilar predominant pattern of subpleural reticulation, ground-glass, traction bronchiolectasis   08/2015 HRCT -worse honeycombing  Labs 08/2014  showed ESR at 23, Hypsensiivity panel neg ,  ANA positive but titer neg , CCP neg, hypersens panel neg .   PFT 09/2014 showed mild to mod restriction FVC at 60%,  FEV1 at 64%, ratio 79,  with low DLCO at 50%.    Review of Systems neg for any significant sore throat, dysphagia, itching, sneezing, nasal congestion or excess/ purulent secretions, fever,  chills, sweats, unintended wt loss, pleuritic or exertional cp, hempoptysis, orthopnea pnd or change in chronic leg swelling. Also denies presyncope, palpitations, heartburn, abdominal pain, nausea, vomiting, diarrhea or change in bowel or urinary habits, dysuria,hematuria, rash, arthralgias, visual complaints, headache, numbness weakness or ataxia.     Objective:   Physical Exam  Gen. Pleasant, well-nourished, in no distress ENT - no lesions, no post nasal drip Neck: No JVD, no thyromegaly, no carotid bruits Lungs: no use of accessory muscles, no dullness to percussion, bibasal rales no rhonchi  Cardiovascular: Rhythm regular, heart sounds  normal, no murmurs or gallops, no peripheral edema Musculoskeletal: No deformities, no cyanosis or clubbing        Assessment & Plan:

## 2015-10-10 NOTE — Progress Notes (Signed)
The patient at the treatment machine yesterday and today, she is being treated right now for a mastitis of her right breast in the treatment area. Yesterday her breast was still indurated in the upper aspect of the field. The breast was performed, informed to the touch. In discussing the patient's medical regimen, she has been forgetful and taking her 3 times a day Keflex, but unfortunately cannot take twice a day Bactrim due to a sulfa allergy. I strongly encouraged her to try to remember to take this by setting alarm on her bone. She believes that she has missed about 5 total doses since last Friday. On her exam today in the dressing room, there is persistent erythema and warmth to the touch but given more smooth and less edematous, there is some palpable induration still within the field, but this site is approximately 4 cm in greatest dimension where it previously been obtained about 7 cm. overall. I see the patient again later this week, and she is strongly encouraged to call her symptoms started to become more noticeable. She states agreement, and is encouraged to call she has questions or concerns     Carola Rhine, [facility]

## 2015-10-10 NOTE — Patient Instructions (Signed)
You have pulmonary fibrosis with seems to be slightly worse from 2016 We will start you on [name] twice daily We discussed side effects of nausea and diarrhea and the need to monitor liver tests by  blood work every few months

## 2015-10-10 NOTE — Telephone Encounter (Signed)
doxycycline (VIBRA-TABS) 100 MG tablet 20 tablet 0 10/10/2015     Sig - Route: Take 1 tablet (100 mg total) by mouth 2 (two) times daily. - Oral   Called in script to [facility] [id number] [place] [address] since it failed to cross over to the pharmacy.  Informed Ms. Carmen Duncan that the script was called in and she expressed thanks.

## 2015-10-11 ENCOUNTER — Ambulatory Visit
Admission: RE | Admit: 2015-10-11 | Discharge: 2015-10-11 | Disposition: A | Discharge status: Home / Self Care | Payer: Medicare Other | Source: Ambulatory Visit | Attending: Radiation Oncology | Admitting: Radiation Oncology

## 2015-10-11 ENCOUNTER — Other Ambulatory Visit: Payer: Self-pay | Admitting: Radiation Oncology

## 2015-10-11 DIAGNOSIS — Z510 Encounter for antineoplastic radiation therapy: Secondary | ICD-10-CM | POA: Diagnosis not present

## 2015-10-11 NOTE — Telephone Encounter (Signed)
Patient son Carmen Duncan called back. He is teaching a class and will try to answer his phone if he can. He states that if a message can be left on his vm he would like that - his vm is confidential. He can be reached at [phone]

## 2015-10-11 NOTE — Telephone Encounter (Signed)
lmtcb X2 for pt's son Carmen Duncan

## 2015-10-11 NOTE — Progress Notes (Signed)
I saw the patient at the treatment machine against A, her erythema and induration of the breast similar compared to yesterday. Of note Dr. Milas Hock also saw the patient yesterday and switch her antibiotics to doxycycline twice a day. We will continue to treat as we move forward however be cautious with her recent infection and ensure that this is improving. I will reexamine her tomorrow.     Carola Rhine, PAC

## 2015-10-11 NOTE — Telephone Encounter (Signed)
lmtcb x1 for Mitch.

## 2015-10-12 ENCOUNTER — Ambulatory Visit
Admission: RE | Admit: 2015-10-12 | Discharge: 2015-10-12 | Disposition: A | Discharge status: Home / Self Care | Payer: Medicare Other | Source: Ambulatory Visit | Attending: Radiation Oncology | Admitting: Radiation Oncology

## 2015-10-12 DIAGNOSIS — Z510 Encounter for antineoplastic radiation therapy: Secondary | ICD-10-CM | POA: Diagnosis not present

## 2015-10-12 NOTE — Telephone Encounter (Signed)
lmtcb x2 for Carmen Duncan.

## 2015-10-13 ENCOUNTER — Ambulatory Visit: Payer: Medicare Other | Admitting: Radiation Oncology

## 2015-10-13 ENCOUNTER — Ambulatory Visit
Admission: RE | Admit: 2015-10-13 | Discharge: 2015-10-13 | Disposition: A | Discharge status: Home / Self Care | Payer: Medicare Other | Source: Ambulatory Visit | Attending: Radiation Oncology | Admitting: Radiation Oncology

## 2015-10-13 ENCOUNTER — Encounter: Payer: Self-pay | Admitting: Radiation Oncology

## 2015-10-13 VITALS — BP 135/56 | HR 73 | Temp 98.0°F | Resp 16 | Wt 126.9 lb

## 2015-10-13 DIAGNOSIS — C50411 Malignant neoplasm of upper-outer quadrant of right female breast: Secondary | ICD-10-CM

## 2015-10-13 DIAGNOSIS — Z510 Encounter for antineoplastic radiation therapy: Secondary | ICD-10-CM | POA: Diagnosis not present

## 2015-10-13 NOTE — Progress Notes (Signed)
Weekly rad txs right breast 12/20 completed, bright erythema on breast skin intact, patient forgets to put radiaplex on  , suggested to bring with her and put on after her radiation treatments daily then again bedtime,  appetite okay, mild fatigue, occasional twinges in right breast 12:17 PM BP 135/56 mmHg  Pulse 73  Temp(Src) 98 F (36.7 C) (Oral)  Resp 16  Wt 126 lb 14.4 oz (57.561 kg)  Wt Readings from Last 3 Encounters:  10/13/15 126 lb 14.4 oz (57.561 kg)  10/10/15 126 lb 9.6 oz (57.425 kg)  09/29/15 127 lb 6.4 oz (57.788 kg)

## 2015-10-13 NOTE — Progress Notes (Signed)
   Department of Radiation Oncology  Phone:  [phone] Fax:        [phone]  Weekly Treatment Note    Name: Carmen Duncan Date: 10/13/2015 MRN: [id number] DOB: 1941-03-31   Diagnosis:     ICD-9-CM ICD-10-CM   1. Breast cancer of upper-outer quadrant of right female breast ([place]) 174.4 C50.411      Current dose: 30 Gy  Current fraction: 12   MEDICATIONS: Current Outpatient Prescriptions  Medication Sig Dispense Refill  . acetaminophen (TYLENOL) 325 MG tablet Take 650 mg by mouth every 6 (six) hours as needed for headache.    [name] atorvastatin (LIPITOR) 20 MG tablet TAKE ONE TABLET BY MOUTH ONCE DAILY 30 tablet 11  . clidinium-chlordiazePOXIDE (LIBRAX) 5-2.5 MG capsule Take 1 capsule by mouth 2 (two) times daily as needed. 60 capsule 3  . clopidogrel (PLAVIX) 75 MG tablet Take 1 tablet (75 mg total) by mouth daily. 30 tablet 5  . doxycycline (VIBRA-TABS) 100 MG tablet Take 1 tablet (100 mg total) by mouth 2 (two) times daily. 20 tablet 0  . hyaluronate sodium (RADIAPLEXRX) GEL Apply 1 application topically 2 (two) times daily.    . non-metallic deodorant Jethro Poling) MISC Apply 1 application topically daily as needed.    [name] oxyCODONE-acetaminophen (ROXICET) 5-325 MG tablet Take 1-2 tablets by mouth every 4 (four) hours as needed. 30 tablet 0  . pantoprazole (PROTONIX) 40 MG tablet TAKE ONE TABLET BY MOUTH ONCE DAILY 30 tablet 5  . Probiotic Product (ACIDOPHILUS) 90-25 MG CHEW Chew by mouth. Reported on 08/28/2015    . propranolol (INDERAL) 20 MG tablet Take 1 tablet (20 mg total) by mouth daily. 30 tablet 6   No current facility-administered medications for this encounter.     ALLERGIES: Latex and Sulfonamide derivatives   LABORATORY DATA:  Lab Results  Component Value Date   WBC 5.7 07/26/2015   HGB 13.7 07/26/2015   HCT 41.4 07/26/2015   MCV 88.1 07/26/2015   PLT 217 07/26/2015   Lab Results  Component Value Date   NA 139 07/26/2015   K 4.8 07/26/2015   CL 105  01/04/2015   CO2 28 07/26/2015   Lab Results  Component Value Date   ALT 13 07/26/2015   AST 20 07/26/2015   ALKPHOS 79 07/26/2015   BILITOT 0.37 07/26/2015     NARRATIVE: Carmen Duncan was seen today for weekly treatment management. The chart was checked and the patient's films were reviewed.  Weekly rad txs right breast 12/20 completed, bright erythema on breast skin intact, patient forgets to put radiaplex on  , suggested to bring with her and put on after her radiation treatments daily then again bedtime,  appetite okay, mild fatigue, occasional twinges in right breast 3:56 PM BP 135/56 mmHg  Pulse 73  Temp(Src) 98 F (36.7 C) (Oral)  Resp 16  Wt 126 lb 14.4 oz (57.561 kg)  Wt Readings from Last 3 Encounters:  10/13/15 126 lb 14.4 oz (57.561 kg)  10/10/15 126 lb 9.6 oz (57.425 kg)  09/29/15 127 lb 6.4 oz (57.788 kg)    PHYSICAL EXAMINATION: weight is 126 lb 14.4 oz (57.561 kg). Her oral temperature is 98 F (36.7 C). Her blood pressure is 135/56 and her pulse is 73. Her respiration is 16.        ASSESSMENT: The patient is doing satisfactorily with treatment.  PLAN: We will continue with the patient's radiation treatment as planned.

## 2015-10-13 NOTE — Telephone Encounter (Signed)
Spoke with pt's son, [name]. States that RA called and spoke with him yesterday. Nothing further was needed at this time.

## 2015-10-16 ENCOUNTER — Ambulatory Visit
Admission: RE | Admit: 2015-10-16 | Discharge: 2015-10-16 | Disposition: A | Discharge status: Home / Self Care | Payer: Medicare Other | Source: Ambulatory Visit | Attending: Radiation Oncology | Admitting: Radiation Oncology

## 2015-10-16 DIAGNOSIS — Z510 Encounter for antineoplastic radiation therapy: Secondary | ICD-10-CM | POA: Diagnosis not present

## 2015-10-16 DIAGNOSIS — C50411 Malignant neoplasm of upper-outer quadrant of right female breast: Secondary | ICD-10-CM | POA: Diagnosis not present

## 2015-10-17 ENCOUNTER — Ambulatory Visit
Admission: RE | Admit: 2015-10-17 | Discharge: 2015-10-17 | Disposition: A | Discharge status: Home / Self Care | Payer: Medicare Other | Source: Ambulatory Visit | Attending: Radiation Oncology | Admitting: Radiation Oncology

## 2015-10-17 ENCOUNTER — Other Ambulatory Visit: Payer: Self-pay | Admitting: Radiation Oncology

## 2015-10-17 ENCOUNTER — Telehealth: Payer: Self-pay | Admitting: Pulmonary Disease

## 2015-10-17 ENCOUNTER — Encounter: Payer: Self-pay | Admitting: Radiation Oncology

## 2015-10-17 DIAGNOSIS — Z510 Encounter for antineoplastic radiation therapy: Secondary | ICD-10-CM | POA: Diagnosis not present

## 2015-10-17 NOTE — Telephone Encounter (Signed)
ATC Hilda back.  Received VM - lmomtcb

## 2015-10-17 NOTE — Telephone Encounter (Signed)
Carmen Duncan called back - she said she has no extension and anyone that answer can assist with the problem. Also, she states if a voicemail is left confirming start of medicine will be okay too. - prm

## 2015-10-17 NOTE — Telephone Encounter (Signed)
Hilda returned call, [phone].

## 2015-10-17 NOTE — Telephone Encounter (Signed)
ATC but got vm again. Left request for Hilda's ext so that we can reach her directly as their voice prompt asks for ext number.

## 2015-10-17 NOTE — Telephone Encounter (Signed)
LMTCB for pharmacy/Hilda

## 2015-10-17 NOTE — Progress Notes (Signed)
The patient was seen on the machine today for follow up of her right breast cellulitis/mastitis. She continues on Doxycycline 100 mg BID and has 5 doses left. I evaluated her breast and there is still persistent erythema, though less skin affected by this. She has persistent fullness and induration about 5cm in dimension, and along her lumpectomy scar, the skin is warm to the touch and slightly tender. I will discuss with Dr. Lisbeth Renshaw, but I let the patient know I'm concerned about needing to involve Dr. Marlou Starks again since she is not responding as well to the antibiotics as I had hoped.

## 2015-10-18 ENCOUNTER — Ambulatory Visit: Payer: Medicare Other

## 2015-10-18 ENCOUNTER — Ambulatory Visit
Admission: RE | Admit: 2015-10-18 | Discharge: 2015-10-18 | Disposition: A | Discharge status: Home / Self Care | Payer: Medicare Other | Source: Ambulatory Visit | Attending: Radiation Oncology | Admitting: Radiation Oncology

## 2015-10-18 DIAGNOSIS — Z510 Encounter for antineoplastic radiation therapy: Secondary | ICD-10-CM | POA: Diagnosis not present

## 2015-10-18 NOTE — Telephone Encounter (Signed)
Another message has been left with Lenell Antu to return our call.

## 2015-10-19 ENCOUNTER — Ambulatory Visit
Admission: RE | Admit: 2015-10-19 | Discharge: 2015-10-19 | Disposition: A | Discharge status: Home / Self Care | Payer: Medicare Other | Source: Ambulatory Visit | Attending: Radiation Oncology | Admitting: Radiation Oncology

## 2015-10-19 ENCOUNTER — Ambulatory Visit: Payer: Medicare Other

## 2015-10-19 DIAGNOSIS — Z510 Encounter for antineoplastic radiation therapy: Secondary | ICD-10-CM | POA: Diagnosis not present

## 2015-10-19 NOTE — Telephone Encounter (Signed)
lmtcb for [employer]. Per triage protocol, this message will be closed.

## 2015-10-20 ENCOUNTER — Ambulatory Visit
Admission: RE | Admit: 2015-10-20 | Discharge: 2015-10-20 | Disposition: A | Discharge status: Home / Self Care | Payer: Medicare Other | Source: Ambulatory Visit | Attending: Radiation Oncology | Admitting: Radiation Oncology

## 2015-10-20 ENCOUNTER — Ambulatory Visit: Payer: Medicare Other | Admitting: Radiation Oncology

## 2015-10-20 ENCOUNTER — Telehealth: Payer: Self-pay | Admitting: *Deleted

## 2015-10-20 ENCOUNTER — Ambulatory Visit: Payer: Medicare Other

## 2015-10-20 ENCOUNTER — Telehealth: Payer: Self-pay | Admitting: Pulmonary Disease

## 2015-10-20 VITALS — HR 75 | Temp 97.5°F | Ht 61.0 in | Wt 126.5 lb

## 2015-10-20 DIAGNOSIS — C50411 Malignant neoplasm of upper-outer quadrant of right female breast: Secondary | ICD-10-CM | POA: Diagnosis not present

## 2015-10-20 DIAGNOSIS — Z510 Encounter for antineoplastic radiation therapy: Secondary | ICD-10-CM | POA: Diagnosis not present

## 2015-10-20 DIAGNOSIS — N644 Mastodynia: Secondary | ICD-10-CM | POA: Insufficient documentation

## 2015-10-20 DIAGNOSIS — L538 Other specified erythematous conditions: Secondary | ICD-10-CM | POA: Insufficient documentation

## 2015-10-20 DIAGNOSIS — Z923 Personal history of irradiation: Secondary | ICD-10-CM | POA: Insufficient documentation

## 2015-10-20 MED ORDER — ALRA NON-METALLIC DEODORANT (RAD-ONC)
1.0000 "application " | Freq: Once | TOPICAL | Status: AC
  Administered 2015-10-20: 1 via TOPICAL

## 2015-10-20 NOTE — Telephone Encounter (Signed)
Scheduled t see Md Wednesday after tx, completes on Thursday

## 2015-10-20 NOTE — Progress Notes (Signed)
Complex simulation note  Diagnosis: Right-sided breast cancer  Narrative The patient has initially been planned to receive a course of whole breast radiation to a dose of 42.5 Gy in 17 fractions. The patient will now receive an additional boost to the seroma cavity which has been contoured. This will correspond to a boost of 7.5 Gy at 2.5 Gy per fraction. To accomplish this, an additional 3 customized blocks have been designed for this purpose. A complex isodose plan is requested to ensure that the target area is adequately covered with radiation dose and that the nearby normal structures such as the lung are adequately spared. The patient's final total dose will be 50 Gy.  ------------------------------------------------  Carmen Duncan S. Carmen Ayuso, MD, PhD   

## 2015-10-20 NOTE — Progress Notes (Signed)
Department of Radiation Oncology  Phone:  [phone] Fax:        [phone]  Weekly Treatment Note    Name: Carmen Duncan Date: 10/20/2015 MRN: [id number] DOB: [birth year]   Diagnosis:     ICD-9-CM ICD-10-CM   1. Breast cancer of upper-outer quadrant of right female breast ([facility]) 174.4 [id number] non-metallic deodorant (ALRA) 1 application     Current dose: 42.5 Gy  Current fraction: 17   MEDICATIONS: Current Outpatient Prescriptions  Medication Sig Dispense Refill  . acetaminophen (TYLENOL) 325 MG tablet Take 650 mg by mouth every 6 (six) hours as needed for headache.    [name] atorvastatin (LIPITOR) 20 MG tablet TAKE ONE TABLET BY MOUTH ONCE DAILY 30 tablet 11  . clidinium-chlordiazePOXIDE (LIBRAX) 5-2.5 MG capsule Take 1 capsule by mouth 2 (two) times daily as needed. 60 capsule 3  . clopidogrel (PLAVIX) 75 MG tablet Take 1 tablet (75 mg total) by mouth daily. 30 tablet 5  . doxycycline (VIBRA-TABS) 100 MG tablet Take 1 tablet (100 mg total) by mouth 2 (two) times daily. 20 tablet 0  . hyaluronate sodium (RADIAPLEXRX) GEL Apply 1 application topically 2 (two) times daily.    . non-metallic deodorant Jethro Poling) MISC Apply 1 application topically daily as needed.    . pantoprazole (PROTONIX) 40 MG tablet TAKE ONE TABLET BY MOUTH ONCE DAILY 30 tablet 5  . Probiotic Product (ACIDOPHILUS) 90-25 MG CHEW Chew by mouth. Reported on 08/28/2015    . propranolol (INDERAL) 20 MG tablet Take 1 tablet (20 mg total) by mouth daily. 30 tablet 6  . oxyCODONE-acetaminophen (ROXICET) 5-325 MG tablet Take 1-2 tablets by mouth every 4 (four) hours as needed. (Patient not taking: Reported on 10/20/2015) 30 tablet 0   No current facility-administered medications for this encounter.     ALLERGIES: Latex and Sulfonamide derivatives   LABORATORY DATA:  Lab Results  Component Value Date   WBC 5.7 07/26/2015   HGB 13.7 07/26/2015   HCT 41.4 07/26/2015   MCV 88.1 07/26/2015   PLT 217 07/26/2015    Lab Results  Component Value Date   NA 139 07/26/2015   K 4.8 07/26/2015   CL 105 01/04/2015   CO2 28 07/26/2015   Lab Results  Component Value Date   ALT 13 07/26/2015   AST 20 07/26/2015   ALKPHOS 79 07/26/2015   BILITOT 0.37 07/26/2015     NARRATIVE: Carmen Duncan was seen today for weekly treatment management. The chart was checked and the patient's films were reviewed.  Carmen Duncan has received 17 fractions to her right breast. Note diffuse, bright erythema of right breast noted with intact skin. She reports tenderness of her breast, but no pain,and grades this at a level 6/10.  She also reports increased fatigue since start of tx and takes a nap daily.  Given Alra deodorant.  The patient states that the right breast continues to feel better. She is nearing the completion of her course of antibiotics.  PHYSICAL EXAMINATION: height is 5\' 1"  (1.549 m) and weight is 126 lb 8 oz (57.38 kg). Her temperature is 97.5 F (36.4 C). Her pulse is 75.      The right breast remains erythematous, with some swelling present in the upper aspect.  ASSESSMENT: The patient is doing satisfactorily with treatment.  PLAN: We will continue with the patient's radiation treatment as planned. We will recheck the right breast next week. We discussed that she may need to see the surgeon for reassessment  as well after we finish treatment given some continued soreness and swelling in the right breast region. She knows to finish her antibiotics.

## 2015-10-20 NOTE — Telephone Encounter (Signed)
Routing to [facility] for follow up.

## 2015-10-20 NOTE — Progress Notes (Signed)
Carmen Duncan has received 17 fractions to her right breast. Note diffuse, bright erythema of right breast noted with intact skin. She reports tenderness of her breast, but no pain,and grades this at a level 6/10.  She also reports increased fatigue since start of tx and takes a nap daily.  Given Alra deodorant

## 2015-10-23 ENCOUNTER — Ambulatory Visit: Payer: Medicare Other

## 2015-10-24 ENCOUNTER — Ambulatory Visit: Payer: Medicare Other

## 2015-10-24 DIAGNOSIS — Z510 Encounter for antineoplastic radiation therapy: Secondary | ICD-10-CM | POA: Diagnosis not present

## 2015-10-25 ENCOUNTER — Ambulatory Visit
Admission: RE | Admit: 2015-10-25 | Discharge: 2015-10-25 | Disposition: A | Discharge status: Home / Self Care | Payer: Medicare Other | Source: Ambulatory Visit | Attending: Radiation Oncology | Admitting: Radiation Oncology

## 2015-10-25 ENCOUNTER — Ambulatory Visit: Payer: Medicare Other

## 2015-10-25 ENCOUNTER — Telehealth: Payer: Self-pay | Admitting: *Deleted

## 2015-10-25 ENCOUNTER — Encounter: Payer: Self-pay | Admitting: Radiation Oncology

## 2015-10-25 VITALS — Wt 128.4 lb

## 2015-10-25 DIAGNOSIS — Z510 Encounter for antineoplastic radiation therapy: Secondary | ICD-10-CM | POA: Diagnosis not present

## 2015-10-25 DIAGNOSIS — C50411 Malignant neoplasm of upper-outer quadrant of right female breast: Secondary | ICD-10-CM

## 2015-10-25 NOTE — Progress Notes (Addendum)
Department of Radiation Oncology  Phone:  [phone] Fax:        [phone]  Weekly Treatment Note    Name: Carmen Duncan Date: 10/25/2015 MRN: [id number] DOB: 02-23-1941   Diagnosis:     ICD-9-CM ICD-10-CM   1. Breast cancer of upper-outer quadrant of right female breast ([place]) 174.4 C50.411 Ambulatory referral to General Surgery     Current dose: 47.5 Gy  Current fraction: 19    MEDICATIONS: Current Outpatient Prescriptions  Medication Sig Dispense Refill  . acetaminophen (TYLENOL) 325 MG tablet Take 650 mg by mouth every 6 (six) hours as needed for headache.    [name] atorvastatin (LIPITOR) 20 MG tablet TAKE ONE TABLET BY MOUTH ONCE DAILY 30 tablet 11  . clidinium-chlordiazePOXIDE (LIBRAX) 5-2.5 MG capsule Take 1 capsule by mouth 2 (two) times daily as needed. 60 capsule 3  . clopidogrel (PLAVIX) 75 MG tablet Take 1 tablet (75 mg total) by mouth daily. 30 tablet 5  . doxycycline (VIBRA-TABS) 100 MG tablet Take 1 tablet (100 mg total) by mouth 2 (two) times daily. 20 tablet 0  . hyaluronate sodium (RADIAPLEXRX) GEL Apply 1 application topically 2 (two) times daily.    . non-metallic deodorant Jethro Poling) MISC Apply 1 application topically daily as needed.    [name] oxyCODONE-acetaminophen (ROXICET) 5-325 MG tablet Take 1-2 tablets by mouth every 4 (four) hours as needed. (Patient not taking: Reported on 10/20/2015) 30 tablet 0  . pantoprazole (PROTONIX) 40 MG tablet TAKE ONE TABLET BY MOUTH ONCE DAILY 30 tablet 5  . Probiotic Product (ACIDOPHILUS) 90-25 MG CHEW Chew by mouth. Reported on 08/28/2015    . propranolol (INDERAL) 20 MG tablet Take 1 tablet (20 mg total) by mouth daily. 30 tablet 6   No current facility-administered medications for this encounter.     ALLERGIES: Latex and Sulfonamide derivatives   LABORATORY DATA:  Lab Results  Component Value Date   WBC 5.7 07/26/2015   HGB 13.7 07/26/2015   HCT 41.4 07/26/2015   MCV 88.1 07/26/2015   PLT 217 07/26/2015   Lab  Results  Component Value Date   NA 139 07/26/2015   K 4.8 07/26/2015   CL 105 01/04/2015   CO2 28 07/26/2015   Lab Results  Component Value Date   ALT 13 07/26/2015   AST 20 07/26/2015   ALKPHOS 79 07/26/2015   BILITOT 0.37 07/26/2015     NARRATIVE: Carmen Duncan was seen today for weekly treatment management. The chart was checked and the patient's films were reviewed.  Patient seen by Shona Simpson, PA-C Referral sent To Dr. Marlou Starks office, they will give patient a call, patient informed,   Patient reports continued less discomfort in the treatment area.   PHYSICAL EXAMINATION: weight is 128 lb 6.4 oz (58.242 kg).     Diffuse bright erythema in the right breast region with remaining palpable seroma in the surgical area.     ASSESSMENT: The patient is doing satisfactorily with treatment.  The patient has significant erythema secondary to radiation treatment and she finishes her final fraction tomorrow. I would like for the patient to be seen by surgery after a little recovery time has taken place with some resolution of the erythema in the treatment area. I believe this will give a clearer picture in terms of the need for any further management of her seroma. The patient just finished a course of antibiotics.  PLAN: We will continue with the patient's radiation treatment as planned. Follow-up in one  month.    ------------------------------------------------  Jodelle Gross, MD, PhD  This document serves as a record of services personally performed by Kyung Rudd, MD. It was created on his behalf by Derek Mound, a trained medical scribe. The creation of this record is based on the scribe's personal observations and the provider's statements to them. This document has been checked and approved by the attending provider.

## 2015-10-25 NOTE — Telephone Encounter (Signed)
CALLED PATIENT TO INFORM OF APPT. WITH DR. Buckingham ON 11-07-15- ARRIVAL TIME - 11 AM, SPOKE WITH PATIENT AND SHE IS AWARE OF THIS APPT.

## 2015-10-25 NOTE — Progress Notes (Signed)
Patient seen by Shona Simpson, PA-C  Referral  sent  To Dr. Marlou Starks office, they will give patient a call,patient informed,  12:11 PM

## 2015-10-25 NOTE — Telephone Encounter (Signed)
Called [facility] for [name] PA  LM for Theadora Rama [phone] to return call

## 2015-10-26 ENCOUNTER — Ambulatory Visit
Admission: RE | Admit: 2015-10-26 | Discharge: 2015-10-26 | Disposition: A | Discharge status: Home / Self Care | Payer: Medicare Other | Source: Ambulatory Visit | Attending: Radiation Oncology | Admitting: Radiation Oncology

## 2015-10-26 ENCOUNTER — Encounter: Payer: Self-pay | Admitting: Radiation Oncology

## 2015-10-26 ENCOUNTER — Encounter: Payer: Self-pay | Admitting: Neurology

## 2015-10-26 ENCOUNTER — Ambulatory Visit (INDEPENDENT_AMBULATORY_CARE_PROVIDER_SITE_OTHER): Payer: Medicare Other | Admitting: Neurology

## 2015-10-26 ENCOUNTER — Telehealth: Payer: Self-pay | Admitting: *Deleted

## 2015-10-26 VITALS — BP 142/80 | HR 64 | Ht 61.0 in | Wt 126.0 lb

## 2015-10-26 DIAGNOSIS — G250 Essential tremor: Secondary | ICD-10-CM | POA: Diagnosis not present

## 2015-10-26 DIAGNOSIS — Z510 Encounter for antineoplastic radiation therapy: Secondary | ICD-10-CM | POA: Diagnosis not present

## 2015-10-26 DIAGNOSIS — E785 Hyperlipidemia, unspecified: Secondary | ICD-10-CM

## 2015-10-26 DIAGNOSIS — C50411 Malignant neoplasm of upper-outer quadrant of right female breast: Secondary | ICD-10-CM | POA: Diagnosis not present

## 2015-10-26 DIAGNOSIS — E538 Deficiency of other specified B group vitamins: Secondary | ICD-10-CM

## 2015-10-26 MED ORDER — PROPRANOLOL HCL 20 MG PO TABS: 20.0000 mg | ORAL_TABLET | Freq: Every day | ORAL | Status: DC

## 2015-10-26 NOTE — Telephone Encounter (Signed)
Spoke with patient to follow up after XRT completion.  She states she is doing great. Discussed survivorship program.  Encouraged her to call with any needs or concerns.

## 2015-10-26 NOTE — Patient Instructions (Signed)
Have labs drawn at your PCP office.

## 2015-10-26 NOTE — Telephone Encounter (Signed)
Called [facility] and had to leave a voicemail. Left pt's info and our phone number for return call.

## 2015-10-26 NOTE — Telephone Encounter (Signed)
Per [facility] they only need to confirm the starting dose for [name].   RA please advise as to starting dose. Thanks!

## 2015-10-26 NOTE — Progress Notes (Signed)
Subjective:   Carmen Duncan was seen in consultation in the movement disorder clinic at the request of [name], MD.  The evaluation is for tremor.  The patient is a 75 y.o. R handed female with a long hx of tremor.   The patient states that the tremor is in the L hand.  She notes it with activation.  If pouring something from a saucepan, it will start to tremor.  It seems to be worse in specific positions.  She has no trouble with putting on makeup or eating as this only affects her nondominant hand.  She has no trouble cutting food.  She has no vocal tremor, head tremor or RUE tremor.  She has no leg tremor.  Sometimes she feels like she is "walking like a drunk" because she is not stable.  She does not feel comfortable near the edge of a pier b/c she feels like she would fall off the edge.  She generally does not fall.  She did fall Jul 16, 2011 because she missed the last 3 steps at her home when she was coming down in a hurry.  She bruised her entire R leg and it is still very sore.    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  I do not have those records.  She saw Dr. [name].  She is on propranolol and she has been on it 7-8 years.  She is on 40 mg twice per day.  It does help.  She states that she was told that her propranolol could not be increased.  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  She sees a Publishing rights manager at [facility] and was told it was nothing to worry about.  She has it monitored q 3 years.  Her last MRI brain was done in [place].   Because of this, she will have intermittent numbness of the L face.    There was a fam hx of tremor in her sister and it is starting in her son, who is 30 years old.  Current/Previously tried tremor medications: propranolol, primidone  Current medications that may exacerbate tremor:  N/a  12/03/12:  She was started on primidone last visit.  She is only taking 1/2 of  a 50 mg tablet at night.  She didn't go up to a full tablet because she did not like the way that it made her feel.  She wants to d/c the medication.  She is on propranolol but the dose cannot be increased because her pulse is already on the low end of normal.  She is having some intermittent paresthesias in her fingertips, right more than left.  03/12/13:  Topamax was added last visit, which has helped but has decreased appetite.  She tremors much less now but notices it when stressed, fatigued or under pressure.  She c/o constipation with the topamax.  She has taste aversion with the medication.  She has some paresthesias with the medication.  She remains on propranolol but is off of the primidone.  She thinks that the SE are worth continuing to take the medication.  She has had worsening of vision but thinks that she had that prior to the topamax and has an eye appt Nov 4.  She worked on last visit.  B12 was reported.  Serum protein electrophoresis did not reveal M spike.  RPR was negative.  Urinary protein electrophoresis  showed some free kappa light chains but there was no monoclonal protein, no M spike.  RPR was negative.  07/28/13 update:  The pt was on topamax 200 mg daily.  She was unable to tolerate bid dosing of the medication.  She ended up going back to 100 mg at night and it makes her sleepy at night when she takes the medication but no hang over effect.  She still has some intermittent paresthesias.    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  She does c/o dull fronal headaches, daily for the last month.  She uses sinus rinse and it helps.  States that tremor has been stable.    02/03/14 update:  The patient has a history of essential tremor, and is currently on propranolol 40 mg once a day as well as Topamax 100 mg daily.   Unless she is very tired or upset, she  does well in regards to tremor.   She received notes from [name] at [facility] since last visit.  He just said that her schwannoma was stable in size and she just needs a repeat MRI for 10 years that they will plan on doing.  She did have a repeat urinary protein electrophoresis since our last visit that was unremarkable.  I had the opportunity to review notes from her primary care physician from 01/20/2014.  There were 2 brief episodes that the patient describes in which she was walking and her legs felt weak.  She felt near syncopal.  She was not dizzy but just stated that "I couldn't have moved if I had to."    It only lasted for a few minutes.  She had already decreased the propranolol on her own when these episodes happened.  She did that primarily because she would forget to take it bid but also because of fatigue.    08/04/14 update:  The patient returns today for follow-up.  Much has happened since her last visit.  Medical records were reviewed.  She has a history of essential tremor and was on propranolol 40 mg, but that has since been reduced to what medical records said was 10 mg daily but pt states is 5 mg daily because of other medical issues.  She remains on Topamax 100 mg daily.  In November, 2015, she went to her primary care physician complaining about malaise and she had a nuclear stress test that was a low risk study with a left ventricular ejection fraction of 83%.  She saw cardiology not long thereafter and ended up having a chest x-ray demonstrating bibasilar infiltrates.  A few days later she developed a fever and was treated for community-acquired pneumonia.  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  During that hospitalization, her propranolol was decreased from 40 mg daily to 10 mg daily because of hypotension.  She became more weak during that hospitalization and was discharged to a subacute rehabilitation facility and ultimately left that rehabilitation  facility and was discharged home on December 23.  She does think that tremor may have increased slightly.  08/30/14 update:  The patient is following up today at the request of her ophthalmologist.  Pt states that she just went for a "normal checkup" because her vision had changed a little and "I needed new frames."  She states that her eye doctor thought that  there was a stroke behind the left eye and that led to an MRI of the brain and MRA of the brain on 08/23/2014.  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  There was an enhancing lesion in the right parietal region as well.  I talked on the phone with the radiologist about this lesion and he felt that this was likely a subacute vascular insult and not a metastatic lesion.  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  She had a carotid ultrasound on 07/14/2014 demonstrating 1-39% stenosis bilaterally.  She did an echocardiogram last year when she was in the hospital on 04/20/2014 demonstrating an ejection fraction of 65-70%, moderate diastolic dysfunction and moderate pulmonary HTN  02/06/15 update:  The patient returns today for follow-up.  She is on Topamax, 100 mg daily and propranolol, 10 mg daily for essential tremor.  At our last visit, I had the patient discontinue her aspirin and we changed it to Plavix.  She is doing well on that.  No melena/hematochezia.  No falls.  She had a TEE that was negative for thrombus and demonstrated normal LV function.  She was supposed to wear an event monitor, and she did, but compliance was very limited and she only wore it for part of a few days, so there was limited data.  There was no arrhythmia when she did wear it.  She had a repeat MRI of the brain on 12/16/2014.  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  On prior MRAs this is consistent with atherosclerotic changes.   I have reviewed primary care records since our last visit.  She has been losing weight over the last several months and her primary care physician asked me if it was from the Topamax.  She has had a fairly extensive negative workup.  Although I doubt it given the amount of time that she has been on Topamax, I told him that she could discontinue it.  They opted to leave her on it for now and take a wait and see approach.  She states that she has no appetite but she is forcing herself to eat.    06/08/15 update:  The patient is following up today for her essential tremor.  Last visit I discontinued her Topamax because of weight loss and memory concerns.  She reports that she does well until she gets really tired and then she will have tremor of the L hand and have trouble pouring things.  She remains on propranolol, 10 mg daily for tremor.  I reviewed records since our last visit.  Her B12 level was very low at 177 and the patient admits she was not being compliant with taking her B12.  She is now on monthly injections.  Her B12 was rechecked yesterday and it was 877.  She is doing well on her Plavix and has had no new focal or lateralizing neuro sx's.  No bleeding.    10/26/15 update:  The patient follows up today regarding essential tremor.  She is on propranolol, 20 mg daily, which was increased last visit.  She states that she is doing "okay" - notices it in the L hand when tired,  mad or angry.  She also has a history of a cerebral infarction.  She is on Plavix.  She is on Lipitor, but when her last fasting lipids were checked in June, 2016 her LDL was 101.  I reviewed records since last visit.  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  Off of b12 injections and forgetting to take pills.         Outside reports reviewed: historical medical records.  Allergies  Allergen Reactions  . Latex Itching, Rash and Other (See Comments)    Red angry skin from latex tape  . Sulfonamide Derivatives Rash    hives    Current Outpatient Prescriptions on File Prior to Visit  Medication Sig Dispense Refill  . acetaminophen (TYLENOL) 325 MG tablet Take 650 mg by mouth every 6 (six) hours as needed for headache.    [name] atorvastatin (LIPITOR) 20 MG tablet TAKE ONE TABLET BY MOUTH ONCE DAILY 30 tablet 11  . clidinium-chlordiazePOXIDE (LIBRAX) 5-2.5 MG capsule Take 1 capsule by mouth 2 (two) times daily as needed. 60 capsule 3  . clopidogrel (PLAVIX) 75 MG tablet Take 1 tablet (75 mg total) by mouth daily. 30 tablet 5  . hyaluronate sodium (RADIAPLEXRX) GEL Apply 1 application topically 2 (two) times daily.    . non-metallic deodorant Jethro Poling) MISC Apply 1 application topically daily as needed.    . pantoprazole (PROTONIX) 40 MG tablet TAKE ONE TABLET BY MOUTH ONCE DAILY 30 tablet 5  . Probiotic Product (ACIDOPHILUS) 90-25 MG CHEW Chew by mouth. Reported on 08/28/2015    . propranolol (INDERAL) 20 MG tablet Take 1 tablet (20 mg total) by mouth daily. 30 tablet 6   No current facility-administered medications on file prior to visit.    Past Medical History  Diagnosis Date  . Arthritis   . Asthma   . Hypertension   . GERD (gastroesophageal reflux disease)   . Hyperlipidemia   . Diverticulosis   . IBS (irritable bowel syndrome)   . HOH (hard of hearing) left ear  . Acoustic neuroma ([place])   . Ejection fraction   . Breast cancer of upper-outer quadrant of right female breast ([place]) 07/19/2015  . Brain cancer ([place])   . Anxiety   . Stroke [facility])     no deficits, on plavix  . Essential tremor     on inderal  . Pneumonia     04-2014, CAP  . Interstitial lung disease [place])     Past Surgical History  Procedure Laterality Date  . Cholecystectomy  2011  . Abdominal hysterectomy  1980  .  Tonsillectomy  1945  . Biopsy breast    . Eyes  2007    cararacts  . Tee without cardioversion N/A 09/05/2014    Procedure: TRANSESOPHAGEAL ECHOCARDIOGRAM (TEE);  Surgeon: Lelon Perla, MD;  Location: [facility] ENDOSCOPY;  Service: Cardiovascular;  Laterality: N/A;  . Breast lumpectomy with radioactive seed and sentinel lymph node biopsy Right 08/10/2015    Procedure: BREAST LUMPECTOMY WITH RADIOACTIVE SEED AND SENTINEL LYMPH NODE BIOPSY;  Surgeon: Autumn Messing III, MD;  Location: [place];  Service: General;  Laterality: Right;    Social History   Social History  . Marital Status: Widowed    Spouse Name: N/A  . Number of Children: N/A  . Years of Education: N/A   Occupational History  . Not on file.   Social History Main Topics  .  Smoking status: Never Smoker   . Smokeless tobacco: Never Used  . Alcohol Use: 0.0 oz/week    0 Standard drinks or equivalent per week     Comment: wine about 3 times a week  . Drug Use: No  . Sexual Activity:    Partners: Female   Other Topics Concern  . Not on file   Social History Narrative    Family Status  Relation Status Death Age  . Mother Deceased     acute leukemia  . Father Deceased     MI, CVA  . Sister Alive     tremor  . Child Alive     son, mild tremor    Review of Systems A complete 10 system ROS was obtained and was negative apart from what is mentioned.   Objective:   VITALS:   Filed Vitals:   10/26/15 1002  BP: 142/80  Pulse: 64  Height: 5\' 1"  (1.549 m)  Weight: 126 lb (57.153 kg)   Wt Readings from Last 3 Encounters:  10/26/15 126 lb (57.153 kg)  10/25/15 128 lb 6.4 oz (58.242 kg)  10/20/15 126 lb 8 oz (57.38 kg)   Gen:  Appears stated age and in NAD. HEENT:  Normocephalic, atraumatic. The mucous membranes are moist. The superficial temporal arteries are without ropiness or tenderness. Cardiovascular: Regular rate and rhythm. Lungs: Clear to auscultation bilaterally. Neck: There are no  carotid bruits noted bilaterally.  NEUROLOGICAL:  Orientation:  The patient is alert and oriented x 3.   Cranial nerves: There is good facial symmetry.  There is pseudoptosis from lid lag bilaterally.    Extraocular muscles are intact and visual fields are full to confrontational testing. Speech is fluent and clear. Soft palate rises symmetrically and there is no tongue deviation. Hearing is intact to conversational tone. Tone: Tone is good throughout.  There is no rigidity. Sensation: Sensation is intact to light touch throughout Coordination:  The patient has no dysdiadichokinesia or dysmetria.  No asymmetries. Motor: Strength is 5/5 in the bilateral upper and lower extremities.  Shoulder shrug is equal bilaterally.  There is no pronator drift.  There are no fasciculations noted. DTR's: Deep tendon reflexes are 1/4 at the bilateral biceps, triceps, brachioradialis, left patella (did not test at the right patella because of pain) and absent at the bilateral achilles.  Plantar responses are downgoing bilaterally. Gait and Station: The patient is able to ambulate without difficulty.   MOVEMENT EXAM: Tremor:  There is mild tremor of L hand.  Becomes more evident when elbow bent and when given something of weight to hold.  Has intention tremor as well.   LABS  Lab Results  Component Value Date   WBC 5.7 07/26/2015   HGB 13.7 07/26/2015   HCT 41.4 07/26/2015   MCV 88.1 07/26/2015   PLT 217 07/26/2015     Chemistry      Component Value Date/Time   NA 139 07/26/2015 1251   NA 140 01/04/2015 1029   NA 139 05/03/2014 0630   K 4.8 07/26/2015 1251   K 4.3 01/04/2015 1029   K 3.2* 05/03/2014 0630   CL 105 01/04/2015 1029   CL 103 05/03/2014 0630   CO2 28 07/26/2015 1251   CO2 28 01/04/2015 1029   CO2 33* 05/03/2014 0630   BUN 13.3 07/26/2015 1251   BUN 14 01/04/2015 1029   BUN 17 05/03/2014 0630   CREATININE 0.8 07/26/2015 1251   CREATININE 0.81 01/04/2015 1029   CREATININE  0.82  05/03/2014 0630      Component Value Date/Time   CALCIUM 9.4 07/26/2015 1251   CALCIUM 9.7 01/04/2015 1029   CALCIUM 8.4* 05/03/2014 0630   ALKPHOS 79 07/26/2015 1251   ALKPHOS 64 01/04/2015 1029   AST 20 07/26/2015 1251   AST 18 01/04/2015 1029   ALT 13 07/26/2015 1251   ALT 13 01/04/2015 1029   BILITOT 0.37 07/26/2015 1251   BILITOT 0.3 01/04/2015 1029     Lab Results  Component Value Date   TSH 2.79 01/04/2015      Assessment/Plan:   1.  Essential Tremor.  Refilled propranolol 20 mg daily.  Doing well with that.   -Off topamax due to cognitive change 2.  Mild gait instability.  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  This seems overall very mild.  We talked about the diagnosis.  -w/u for reversible causes was negative  -We talked about safety associated with peripheral neuropathy. 3.  Acoustic neuroma.  -This is being followed by ENT and neurosurgery at [facility].  Very small  4.  Hx cerebral infarct  -On plavix and doing well.  No source thrombus ever found  -Talked with the patient about the fact that her goal LDL should be less than 70.  Her last fasting LDL was 101 but that was 10/2014.  Will recheck and if similar, we will increase her Lipitor from 20 mg to 40 mg daily.  Discussed risks, benefits, and side effects of statin medications. 5.  B12 deficiency  -off of injections; has pills but "forgets to take".  Will recheck B12 level 6.  F/u in next few 5-6 months, sooner should new neuro issues arise.

## 2015-10-26 NOTE — Addendum Note (Signed)
Encounter addended by: Hayden Pedro, PA-C on: 10/26/2015  1:48 PM<BR>     Documentation filed: Notes Section

## 2015-10-26 NOTE — Telephone Encounter (Signed)
Called [facility] and spoke with [facility], states that pt needs a PA for [name].  Per past telephone notes it looks like pt is already enrolled with [name] program. Called [facility] at [phone], was put on hold over 10 minutes.  Wcb.

## 2015-10-30 NOTE — Telephone Encounter (Signed)
100 bid

## 2015-10-31 NOTE — Telephone Encounter (Signed)
Spoke to Parker with [employer] and confirmed dosage of [name]-prm

## 2015-10-31 NOTE — Telephone Encounter (Signed)
Called [facility] to verify dosing of [name], LM x 1

## 2015-11-01 ENCOUNTER — Other Ambulatory Visit: Payer: Self-pay | Admitting: Radiation Oncology

## 2015-11-02 ENCOUNTER — Other Ambulatory Visit: Payer: Self-pay | Admitting: General Practice

## 2015-11-02 NOTE — Telephone Encounter (Signed)
Pt would like a call back when this is done.

## 2015-11-03 MED ORDER — CILIDINIUM-CHLORDIAZEPOXIDE 2.5-5 MG PO CAPS: 1.0000 | ORAL_CAPSULE | Freq: Two times a day (BID) | ORAL | Status: DC | PRN

## 2015-11-03 NOTE — Telephone Encounter (Signed)
Faxed to pharmacy

## 2015-11-03 NOTE — Telephone Encounter (Signed)
Refill with 3 additional refills   

## 2015-11-03 NOTE — Telephone Encounter (Signed)
Rx printed ready for signature

## 2015-11-06 ENCOUNTER — Ambulatory Visit (HOSPITAL_BASED_OUTPATIENT_CLINIC_OR_DEPARTMENT_OTHER): Payer: Medicare Other | Admitting: Oncology

## 2015-11-06 ENCOUNTER — Telehealth: Payer: Self-pay | Admitting: Oncology

## 2015-11-06 ENCOUNTER — Telehealth: Payer: Self-pay | Admitting: Pulmonary Disease

## 2015-11-06 VITALS — BP 172/74 | HR 70 | Temp 97.6°F | Resp 18 | Ht 61.0 in | Wt 127.3 lb

## 2015-11-06 DIAGNOSIS — C50411 Malignant neoplasm of upper-outer quadrant of right female breast: Secondary | ICD-10-CM

## 2015-11-06 DIAGNOSIS — Z86000 Personal history of in-situ neoplasm of breast: Secondary | ICD-10-CM | POA: Diagnosis not present

## 2015-11-06 NOTE — Progress Notes (Signed)
[place]  [phone] [phone]     ID: Carmen Duncan DOB: 1941/01/20  MR#: [id number]  [id number]  Patient Care Team: [name], MD as PCP - General Chauncey Cruel, MD as Consulting Physician (Oncology) Autumn Messing III, MD as Consulting Physician (General Surgery) Kyung Rudd, MD as Consulting Physician (Radiation Oncology) Rigoberto Noel, MD as Consulting Physician (Pulmonary Disease) PCP: [name], MD GYN: OTHER MD:  CHIEF COMPLAINT: Noninvasive breast cancer  CURRENT TREATMENT: observation   BREAST CANCER HISTORY: From the original intake note:  Carmen Duncan had routine screening mammography at [facility] 07/06/2015 showing a new group of pleomorphic calcifications in the right breast 11:00 position. This measured 2.7 cm. She was recalled for right diagnostic mammography every 16 2017, which showed the breast density to be category C. This confirmed an area of dystrophic calcification in the right breast 11:00 position. Biopsy of this area on the very 20 2017 showed ([id number]) ductal carcinoma in situ, high-grade, estrogen and progesterone receptor negative.  Her subsequent history is as detailed below.  INTERVAL HISTORY: Carmen Duncan returns today for follow-up of her ductal carcinoma in situ accompanied by her daughter-in-law Carmen Duncan. Since her last visit here Fayeunderwent right lumpectomy and sentinel lymph node sampling, on 08/10/2015. This showed ([id number]) ductal carcinoma in situ, grade 3, measuring 1.0 cm. Margins were ample. Both sentinel lymph nodes were clear. Repeat estrogen and progesterone receptors were again negative.  She then proceeded to adjuvant radiation which she completed 10/25/2015  REVIEW OF SYSTEMS: Carmen Duncan did generally well with the radiation, with some soreness in the breast and some fatigue. The fatigue persists. She is doing her housework but not otherwise exercising. She says she likes to walk for  exercise.She has some sinus allergy issues and easy bruising, on Plavix. Otherwise a detailed review of systems today was noncontributory  PAST MEDICAL HISTORY: Past Medical History  Diagnosis Date  . Arthritis   . Asthma   . Hypertension   . GERD (gastroesophageal reflux disease)   . Hyperlipidemia   . Diverticulosis   . IBS (irritable bowel syndrome)   . HOH (hard of hearing) left ear  . Acoustic neuroma ([place])   . Ejection fraction   . Breast cancer of upper-outer quadrant of right female breast ([place]) 07/19/2015  . Brain cancer ([place])   . Anxiety   . Stroke [facility])     no deficits, on plavix  . Essential tremor     on inderal  . Pneumonia     04-2014, CAP  . Interstitial lung disease ([place])     PAST SURGICAL HISTORY: Past Surgical History  Procedure Laterality Date  . Cholecystectomy  2011  . Abdominal hysterectomy  1980  . Tonsillectomy  1945  . Biopsy breast    . Eyes  2007    cararacts  . Tee without cardioversion N/A 09/05/2014    Procedure: TRANSESOPHAGEAL ECHOCARDIOGRAM (TEE);  Surgeon: Lelon Perla, MD;  Location: [facility] ENDOSCOPY;  Service: Cardiovascular;  Laterality: N/A;  . Breast lumpectomy with radioactive seed and sentinel lymph node biopsy Right 08/10/2015    Procedure: BREAST LUMPECTOMY WITH RADIOACTIVE SEED AND SENTINEL LYMPH NODE BIOPSY;  Surgeon: Autumn Messing III, MD;  Location: [place];  Service: General;  Laterality: Right;    FAMILY HISTORY Family History  Problem Relation Age of Onset  . Hyperlipidemia Mother   . Hypertension Mother   . Hyperlipidemia Father   . Heart disease Father 2  .  Stroke Father   . Lung cancer Paternal Uncle     x 4  The patient's father died at the age of 59 from heart related problems. The patient's mother died at age 77, cause of death being unclear to the patient. Carmen Duncan had no brothers, 1 sister. There is significant lung cancer on the paternal side, and several uncles all of whom were smokers. There is  one cousin on the paternal side was diagnosed with cancer in her 22s , primary site unknown to the patient  GYNECOLOGIC HISTORY:  No LMP recorded. Patient has had a hysterectomy. Menarche age 30, first live birth age 55. The patient is GX P1. She is status post hysterectomy but no salpingo-oophorectomy. She used hormone replacement for some time but does not recall how long. 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 She is widowed and lives with her son Carmen Duncan, who is a Software engineer and works for our system. He is also on Scientist, physiological. The patient has 3 grandchildren. She attends a [employer]    ADVANCED DIRECTIVES: In place   HEALTH MAINTENANCE: Social History  Substance Use Topics  . Smoking status: Never Smoker   . Smokeless tobacco: Never Used  . Alcohol Use: 0.0 oz/week    0 Standard drinks or equivalent per week     Comment: wine about 3 times a week     Colonoscopy: NOV 2009  PAP: s/p hysterectomy  Bone density:  Lipid panel:  Allergies  Allergen Reactions  . Latex Itching, Rash and Other (See Comments)    Red angry skin from latex tape  . Sulfonamide Derivatives Rash    hives    Current Outpatient Prescriptions  Medication Sig Dispense Refill  . acetaminophen (TYLENOL) 325 MG tablet Take 650 mg by mouth every 6 (six) hours as needed for headache.    [name] atorvastatin (LIPITOR) 20 MG tablet TAKE ONE TABLET BY MOUTH ONCE DAILY 30 tablet 11  . clidinium-chlordiazePOXIDE (LIBRAX) 5-2.5 MG capsule Take 1 capsule by mouth 2 (two) times daily as needed. 60 capsule 3  . clopidogrel (PLAVIX) 75 MG tablet Take 1 tablet (75 mg total) by mouth daily. 30 tablet 5  . pantoprazole (PROTONIX) 40 MG tablet TAKE ONE TABLET BY MOUTH ONCE DAILY 30 tablet 5  . propranolol (INDERAL) 20 MG tablet Take 1 tablet (20 mg total) by mouth daily. 30 tablet 6   No current  facility-administered medications for this visit.    OBJECTIVE: Older white woman in no acute distress Filed Vitals:   11/06/15 1137  BP: 172/74  Pulse: 70  Temp: 97.6 F (36.4 C)  Resp: 18     Body mass index is 24.07 kg/(m^2).    ECOG FS:1 - Symptomatic but completely ambulatory  Sclerae unicteric, pupils round and equal Oropharynx clear and moist-- no thrush or other lesions No cervical or supraclavicular adenopathy Lungs no rales or rhonchi Heart regular rate and rhythm Abd soft, nontender, positive bowel sounds MSK mild kyphosis but no focal spinal tenderness, no ightupper extremity lymphedema Neuro: nonfocal, well oriented, appropriate affect Breasts: he right breast is status post lumpectomy and radiation. There is minimal erythema Iremaining. There is no disclamation. There is an apparent seroma in the right axilla and  Separate one in the lateral aspect of the right breast. These are not tender or  Inflamed.Otherwise the cosmetic result is  good. The left breast is unremarkable.  LAB RESULTS:  CMP     Component Value Date/Time   NA 139 07/26/2015 1251   NA 140 01/04/2015 1029   NA 139 05/03/2014 0630   K 4.8 07/26/2015 1251   K 4.3 01/04/2015 1029   K 3.2* 05/03/2014 0630   CL 105 01/04/2015 1029   CL 103 05/03/2014 0630   CO2 28 07/26/2015 1251   CO2 28 01/04/2015 1029   CO2 33* 05/03/2014 0630   GLUCOSE 86 07/26/2015 1251   GLUCOSE 90 01/04/2015 1029   GLUCOSE 84 05/03/2014 0630   BUN 13.3 07/26/2015 1251   BUN 14 01/04/2015 1029   BUN 17 05/03/2014 0630   CREATININE 0.8 07/26/2015 1251   CREATININE 0.81 01/04/2015 1029   CREATININE 0.82 05/03/2014 0630   CALCIUM 9.4 07/26/2015 1251   CALCIUM 9.7 01/04/2015 1029   CALCIUM 8.4* 05/03/2014 0630   PROT 7.7 07/26/2015 1251   PROT 7.9 01/04/2015 1029   ALBUMIN 3.3* 07/26/2015 1251   ALBUMIN 3.8 01/04/2015 1029   AST 20 07/26/2015 1251   AST 18 01/04/2015 1029   ALT 13 07/26/2015 1251   ALT 13 01/04/2015  1029   ALKPHOS 79 07/26/2015 1251   ALKPHOS 64 01/04/2015 1029   BILITOT 0.37 07/26/2015 1251   BILITOT 0.3 01/04/2015 1029   GFRNONAA >60 12/16/2014 1320   GFRNONAA >60 05/03/2014 0630   GFRAA >60 12/16/2014 1320   GFRAA >60 05/03/2014 0630    INo results found for: SPEP, UPEP  Lab Results  Component Value Date   WBC 5.7 07/26/2015   NEUTROABS 3.6 07/26/2015   HGB 13.7 07/26/2015   HCT 41.4 07/26/2015   MCV 88.1 07/26/2015   PLT 217 07/26/2015      Chemistry      Component Value Date/Time   NA 139 07/26/2015 1251   NA 140 01/04/2015 1029   NA 139 05/03/2014 0630   K 4.8 07/26/2015 1251   K 4.3 01/04/2015 1029   K 3.2* 05/03/2014 0630   CL 105 01/04/2015 1029   CL 103 05/03/2014 0630   CO2 28 07/26/2015 1251   CO2 28 01/04/2015 1029   CO2 33* 05/03/2014 0630   BUN 13.3 07/26/2015 1251   BUN 14 01/04/2015 1029   BUN 17 05/03/2014 0630   CREATININE 0.8 07/26/2015 1251   CREATININE 0.81 01/04/2015 1029   CREATININE 0.82 05/03/2014 0630      Component Value Date/Time   CALCIUM 9.4 07/26/2015 1251   CALCIUM 9.7 01/04/2015 1029   CALCIUM 8.4* 05/03/2014 0630   ALKPHOS 79 07/26/2015 1251   ALKPHOS 64 01/04/2015 1029   AST 20 07/26/2015 1251   AST 18 01/04/2015 1029   ALT 13 07/26/2015 1251   ALT 13 01/04/2015 1029   BILITOT 0.37 07/26/2015 1251   BILITOT 0.3 01/04/2015 1029       No results found for: LABCA2  No components found for: LABCA125  No results for input(s): INR in the last 168 hours.  Urinalysis    Component Value Date/Time   COLORURINE AMBER* 04/18/2014 Carmen Duncan 04/18/2014 2205   LABSPEC 1.018 04/18/2014 2205   PHURINE 5.5 04/18/2014 2205   GLUCOSEU NEGATIVE 04/18/2014 2205   HGBUR NEGATIVE 04/18/2014 2205   BILIRUBINUR NEGATIVE 04/18/2014 2205   BILIRUBINUR neg 10/02/2010 1108   KETONESUR NEGATIVE 04/18/2014 2205   PROTEINUR NEGATIVE 04/18/2014 2205   PROTEINUR neg 10/02/2010 1108   UROBILINOGEN 0.2 04/18/2014 2205  UROBILINOGEN 0.2 10/02/2010 1108   NITRITE NEGATIVE 04/18/2014 2205   NITRITE neg 10/02/2010 1108   LEUKOCYTESUR NEGATIVE 04/18/2014 2205      ELIGIBLE FOR AVAILABLE RESEARCH PROTOCOL: no  STUDIES: No results found.   ASSESSMENT: 74 y.o. [place] woman, status post right breast upper outer quadrant biopsy 07/17/2015 for ductal carcinoma in situ, grade 3, estrogen and progesterone receptor negative, associated with an area of calcifications measuring 2.7 cm  (1) right lumpectomy and sentinel lymph node sampling 08/10/2015 showed a pTis pN0, stage 0 ductal carcinoma in situ, with negative margins, again estrogen and progesterone receptor negative  (2) adjuvant radiation completed 10/25/2015   (3) CT scan of the chest 09/04/2015 suggests interstitial lung disease.  PLAN: Ahuva has completed local treatment for breast cancer, namely surgery and radiation. She is not a candidate for systemic therapy, because her breast cancer cells were not estrogen dependent and we never used chemotherapy for ductal carcinoma in situ.  I think she will recover from her fatigue a little faster if she participates in the [place] program and/or the [facility] recovery program and I gave her information on both.  I reviewed with her and her daughter-in-law the fact that her tumor was not invasive and therefore not life-threatening.They were concerned that she was not having scans and I reassured them that she does not need scans but also that she had a CT of the chest in April. 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 Marlou Starks tomorrow regarding that  Otherwise I will see Geretha one more time a year from now, and if all is going well at that time I will release her then to Dr. Erick Blinks care.    Chauncey Cruel, MD   11/06/2015 11:46 AM Medical Oncology  and Hematology [facility] [address] [place], [place] [address] Tel. [phone]    Fax. [phone]

## 2015-11-06 NOTE — Telephone Encounter (Signed)
Spoke with Carmen Duncan at [employer]  He states calling to check on the status of PA for [name] I checked RA's lookat and do not see PA form They are going to refax this  Will await fax

## 2015-11-06 NOTE — Telephone Encounter (Signed)
appt made and avs printed °

## 2015-11-07 NOTE — Telephone Encounter (Signed)
Received PA request and submitted PA through cover my meds  Key [id number]  Will forward to [facility] for f/u on this

## 2015-11-08 ENCOUNTER — Telehealth: Payer: Self-pay | Admitting: Pulmonary Disease

## 2015-11-08 NOTE — Telephone Encounter (Signed)
LMTCB for Carmen Duncan.  

## 2015-11-09 NOTE — Telephone Encounter (Signed)
Called [employer] and spoke with [place].  She said that the [name] is still pending PA.  She said that it is currently at the "HUB" to be approved.  She said that she does not have a contact number for the "HUB".  She said that she noticed that a BRIDGE dose was submitted on 10/17/15.  She called the insurance company and stated that they have received the forms from 10/10/15 and they are still awaiting the approval.   Awaiting call back regarding PA.

## 2015-11-09 NOTE — Telephone Encounter (Signed)
FYI:  Form is scanned into chart in Media.  It was faxed on 10/10/15.

## 2015-11-10 NOTE — Telephone Encounter (Signed)
Spoke with Larene Beach at [employer], she said that no PA has been received.   Checked CMM website and noticed that it had sent back the PA questions that needed to be answered. Larene Beach said I could just complete that questionnaire on CMM.    Completed questionnaire on CMM, awaiting response.

## 2015-11-10 NOTE — Telephone Encounter (Signed)
Larene Beach with [employer], [phone] ext [phone].  Please call to discuss notes below.  Please do not leave VM, if does not answer, hang up and call back in 10 min.

## 2015-11-10 NOTE — Telephone Encounter (Signed)
Called [facility] and spoke with Myra to find out why [name] was denied, she that it was denied because patient has a known cause for ILD and this drug is approved only for unknown causes of ILD.  They are sending a letter to our office so we can appeal the decision.    Dr. Elsworth Soho, please advise.

## 2015-11-10 NOTE — Telephone Encounter (Signed)
Montine Circle calling from [facility] [phone] PA has been denied at this time and letter will be sent out

## 2015-11-13 NOTE — Telephone Encounter (Signed)
Please let me know when we obtain this letter

## 2015-11-14 ENCOUNTER — Encounter (HOSPITAL_COMMUNITY): Payer: Self-pay | Admitting: *Deleted

## 2015-11-14 ENCOUNTER — Emergency Department (HOSPITAL_COMMUNITY): Payer: Medicare Other

## 2015-11-14 ENCOUNTER — Other Ambulatory Visit: Payer: Self-pay

## 2015-11-14 ENCOUNTER — Observation Stay (HOSPITAL_COMMUNITY): Payer: Medicare Other

## 2015-11-14 ENCOUNTER — Inpatient Hospital Stay (HOSPITAL_COMMUNITY)
Admission: EM | Admit: 2015-11-14 | Discharge: 2015-11-17 | DRG: 041 | Disposition: A | Discharge status: Rehabilitation Facility | Payer: Medicare Other | Attending: Internal Medicine | Admitting: Internal Medicine

## 2015-11-14 DIAGNOSIS — Z85841 Personal history of malignant neoplasm of brain: Secondary | ICD-10-CM

## 2015-11-14 DIAGNOSIS — G250 Essential tremor: Secondary | ICD-10-CM | POA: Diagnosis not present

## 2015-11-14 DIAGNOSIS — K219 Gastro-esophageal reflux disease without esophagitis: Secondary | ICD-10-CM | POA: Diagnosis not present

## 2015-11-14 DIAGNOSIS — C50411 Malignant neoplasm of upper-outer quadrant of right female breast: Secondary | ICD-10-CM | POA: Diagnosis not present

## 2015-11-14 DIAGNOSIS — I6349 Cerebral infarction due to embolism of other cerebral artery: Principal | ICD-10-CM | POA: Diagnosis present

## 2015-11-14 DIAGNOSIS — Z91040 Latex allergy status: Secondary | ICD-10-CM

## 2015-11-14 DIAGNOSIS — M6289 Other specified disorders of muscle: Secondary | ICD-10-CM | POA: Diagnosis not present

## 2015-11-14 DIAGNOSIS — Z8672 Personal history of thrombophlebitis: Secondary | ICD-10-CM

## 2015-11-14 DIAGNOSIS — I5032 Chronic diastolic (congestive) heart failure: Secondary | ICD-10-CM

## 2015-11-14 DIAGNOSIS — I671 Cerebral aneurysm, nonruptured: Secondary | ICD-10-CM | POA: Diagnosis present

## 2015-11-14 DIAGNOSIS — B370 Candidal stomatitis: Secondary | ICD-10-CM

## 2015-11-14 DIAGNOSIS — I639 Cerebral infarction, unspecified: Secondary | ICD-10-CM | POA: Insufficient documentation

## 2015-11-14 DIAGNOSIS — G8192 Hemiplegia, unspecified affecting left dominant side: Secondary | ICD-10-CM | POA: Diagnosis not present

## 2015-11-14 DIAGNOSIS — G8194 Hemiplegia, unspecified affecting left nondominant side: Secondary | ICD-10-CM | POA: Insufficient documentation

## 2015-11-14 DIAGNOSIS — I272 Other secondary pulmonary hypertension: Secondary | ICD-10-CM | POA: Diagnosis not present

## 2015-11-14 DIAGNOSIS — Z882 Allergy status to sulfonamides status: Secondary | ICD-10-CM

## 2015-11-14 DIAGNOSIS — R531 Weakness: Secondary | ICD-10-CM | POA: Diagnosis present

## 2015-11-14 DIAGNOSIS — Z79899 Other long term (current) drug therapy: Secondary | ICD-10-CM

## 2015-11-14 DIAGNOSIS — E785 Hyperlipidemia, unspecified: Secondary | ICD-10-CM

## 2015-11-14 DIAGNOSIS — E440 Moderate protein-calorie malnutrition: Secondary | ICD-10-CM

## 2015-11-14 DIAGNOSIS — K580 Irritable bowel syndrome with diarrhea: Secondary | ICD-10-CM | POA: Diagnosis present

## 2015-11-14 DIAGNOSIS — F419 Anxiety disorder, unspecified: Secondary | ICD-10-CM | POA: Diagnosis present

## 2015-11-14 DIAGNOSIS — I7300 Raynaud's syndrome without gangrene: Secondary | ICD-10-CM

## 2015-11-14 DIAGNOSIS — D333 Benign neoplasm of cranial nerves: Secondary | ICD-10-CM | POA: Diagnosis present

## 2015-11-14 DIAGNOSIS — R9439 Abnormal result of other cardiovascular function study: Secondary | ICD-10-CM

## 2015-11-14 DIAGNOSIS — K589 Irritable bowel syndrome without diarrhea: Secondary | ICD-10-CM

## 2015-11-14 DIAGNOSIS — E860 Dehydration: Secondary | ICD-10-CM

## 2015-11-14 DIAGNOSIS — R29810 Facial weakness: Secondary | ICD-10-CM | POA: Diagnosis not present

## 2015-11-14 DIAGNOSIS — J84112 Idiopathic pulmonary fibrosis: Secondary | ICD-10-CM | POA: Diagnosis not present

## 2015-11-14 DIAGNOSIS — J849 Interstitial pulmonary disease, unspecified: Secondary | ICD-10-CM | POA: Diagnosis present

## 2015-11-14 DIAGNOSIS — R0609 Other forms of dyspnea: Secondary | ICD-10-CM

## 2015-11-14 DIAGNOSIS — R0600 Dyspnea, unspecified: Secondary | ICD-10-CM

## 2015-11-14 DIAGNOSIS — R9430 Abnormal result of cardiovascular function study, unspecified: Secondary | ICD-10-CM

## 2015-11-14 DIAGNOSIS — Z9049 Acquired absence of other specified parts of digestive tract: Secondary | ICD-10-CM

## 2015-11-14 DIAGNOSIS — R0989 Other specified symptoms and signs involving the circulatory and respiratory systems: Secondary | ICD-10-CM

## 2015-11-14 DIAGNOSIS — I779 Disorder of arteries and arterioles, unspecified: Secondary | ICD-10-CM | POA: Diagnosis not present

## 2015-11-14 DIAGNOSIS — Z7902 Long term (current) use of antithrombotics/antiplatelets: Secondary | ICD-10-CM

## 2015-11-14 DIAGNOSIS — Z8249 Family history of ischemic heart disease and other diseases of the circulatory system: Secondary | ICD-10-CM

## 2015-11-14 DIAGNOSIS — I739 Peripheral vascular disease, unspecified: Secondary | ICD-10-CM

## 2015-11-14 DIAGNOSIS — H9190 Unspecified hearing loss, unspecified ear: Secondary | ICD-10-CM | POA: Diagnosis present

## 2015-11-14 DIAGNOSIS — C4492 Squamous cell carcinoma of skin, unspecified: Secondary | ICD-10-CM

## 2015-11-14 DIAGNOSIS — Z923 Personal history of irradiation: Secondary | ICD-10-CM

## 2015-11-14 DIAGNOSIS — E538 Deficiency of other specified B group vitamins: Secondary | ICD-10-CM

## 2015-11-14 DIAGNOSIS — R29704 NIHSS score 4: Secondary | ICD-10-CM | POA: Diagnosis present

## 2015-11-14 DIAGNOSIS — R29818 Other symptoms and signs involving the nervous system: Secondary | ICD-10-CM | POA: Diagnosis not present

## 2015-11-14 DIAGNOSIS — Z823 Family history of stroke: Secondary | ICD-10-CM

## 2015-11-14 DIAGNOSIS — Z8673 Personal history of transient ischemic attack (TIA), and cerebral infarction without residual deficits: Secondary | ICD-10-CM

## 2015-11-14 DIAGNOSIS — I10 Essential (primary) hypertension: Secondary | ICD-10-CM

## 2015-11-14 DIAGNOSIS — D696 Thrombocytopenia, unspecified: Secondary | ICD-10-CM

## 2015-11-14 DIAGNOSIS — E871 Hypo-osmolality and hyponatremia: Secondary | ICD-10-CM

## 2015-11-14 DIAGNOSIS — R002 Palpitations: Secondary | ICD-10-CM | POA: Diagnosis present

## 2015-11-14 DIAGNOSIS — M6281 Muscle weakness (generalized): Secondary | ICD-10-CM | POA: Diagnosis present

## 2015-11-14 LAB — I-STAT CHEM 8, ED
BUN: 17 mg/dL (ref 6–20)
CALCIUM ION: 1.08 mmol/L — AB (ref 1.13–1.30)
CHLORIDE: 108 mmol/L (ref 101–111)
CREATININE: 0.6 mg/dL (ref 0.44–1.00)
GLUCOSE: 90 mg/dL (ref 65–99)
HCT: 45 % (ref 36.0–46.0)
Hemoglobin: 15.3 g/dL — ABNORMAL HIGH (ref 12.0–15.0)
Potassium: 4.1 mmol/L (ref 3.5–5.1)
Sodium: 139 mmol/L (ref 135–145)
TCO2: 23 mmol/L (ref 0–100)

## 2015-11-14 LAB — DIFFERENTIAL
Basophils Absolute: 0 10*3/uL (ref 0.0–0.1)
Basophils Relative: 1 %
EOS PCT: 6 %
Eosinophils Absolute: 0.3 10*3/uL (ref 0.0–0.7)
LYMPHS ABS: 0.9 10*3/uL (ref 0.7–4.0)
LYMPHS PCT: 19 %
MONO ABS: 0.5 10*3/uL (ref 0.1–1.0)
Monocytes Relative: 10 %
Neutro Abs: 3.3 10*3/uL (ref 1.7–7.7)
Neutrophils Relative %: 64 %

## 2015-11-14 LAB — COMPREHENSIVE METABOLIC PANEL
ALK PHOS: 75 U/L (ref 38–126)
ALT: 20 U/L (ref 14–54)
ANION GAP: 8 (ref 5–15)
AST: 31 U/L (ref 15–41)
Albumin: 3.6 g/dL (ref 3.5–5.0)
BILIRUBIN TOTAL: 0.5 mg/dL (ref 0.3–1.2)
BUN: 15 mg/dL (ref 6–20)
CALCIUM: 9.4 mg/dL (ref 8.9–10.3)
CO2: 22 mmol/L (ref 22–32)
CREATININE: 0.73 mg/dL (ref 0.44–1.00)
Chloride: 107 mmol/L (ref 101–111)
GFR calc non Af Amer: >60 mL/min (ref >60)
GLUCOSE: 94 mg/dL (ref 65–99)
Potassium: 4.1 mmol/L (ref 3.5–5.1)
Sodium: 137 mmol/L (ref 135–145)
TOTAL PROTEIN: 7.4 g/dL (ref 6.5–8.1)

## 2015-11-14 LAB — PROTIME-INR
INR: 0.94 (ref 0.00–1.49)
Prothrombin Time: 12.8 seconds (ref 11.6–15.2)

## 2015-11-14 LAB — CBC
HEMATOCRIT: 45.9 % (ref 36.0–46.0)
HEMOGLOBIN: 15 g/dL (ref 12.0–15.0)
MCH: 29.4 pg (ref 26.0–34.0)
MCHC: 32.7 g/dL (ref 30.0–36.0)
MCV: 90 fL (ref 78.0–100.0)
Platelets: 198 10*3/uL (ref 150–400)
RBC: 5.1 MIL/uL (ref 3.87–5.11)
RDW: 14.1 % (ref 11.5–15.5)
WBC: 5 10*3/uL (ref 4.0–10.5)

## 2015-11-14 LAB — APTT: aPTT: 26 seconds (ref 24–37)

## 2015-11-14 LAB — I-STAT TROPONIN, ED: Troponin i, poc: 0 ng/mL (ref 0.00–0.08)

## 2015-11-14 LAB — CBG MONITORING, ED: GLUCOSE-CAPILLARY: 92 mg/dL (ref 65–99)

## 2015-11-14 MED ORDER — NINTEDANIB ESYLATE 100 MG PO CAPS
100.0000 mg | ORAL_CAPSULE | Freq: Two times a day (BID) | ORAL | Status: DC
  Administered 2015-11-15 – 2015-11-16 (×3): 100 mg via ORAL
  Filled 2015-11-14 (×4): qty 1

## 2015-11-14 MED ORDER — ENOXAPARIN SODIUM 40 MG/0.4ML ~~LOC~~ SOLN
40.0000 mg | SUBCUTANEOUS | Status: DC
  Administered 2015-11-15 – 2015-11-17 (×3): 40 mg via SUBCUTANEOUS
  Filled 2015-11-14 (×3): qty 0.4

## 2015-11-14 MED ORDER — PANTOPRAZOLE SODIUM 40 MG PO TBEC
40.0000 mg | DELAYED_RELEASE_TABLET | Freq: Every day | ORAL | Status: DC
  Administered 2015-11-15 – 2015-11-17 (×4): 40 mg via ORAL
  Filled 2015-11-14 (×4): qty 1

## 2015-11-14 MED ORDER — ATORVASTATIN CALCIUM 10 MG PO TABS
20.0000 mg | ORAL_TABLET | Freq: Every day | ORAL | Status: DC
  Administered 2015-11-15 – 2015-11-17 (×4): 20 mg via ORAL
  Filled 2015-11-14 (×4): qty 2

## 2015-11-14 MED ORDER — STROKE: EARLY STAGES OF RECOVERY BOOK
Freq: Once | Status: AC
  Administered 2015-11-15
  Filled 2015-11-14: qty 1

## 2015-11-14 MED ORDER — ASPIRIN 325 MG PO TABS
325.0000 mg | ORAL_TABLET | Freq: Every day | ORAL | Status: DC
Start: 2015-11-14 — End: 2015-11-14

## 2015-11-14 MED ORDER — ASPIRIN 300 MG RE SUPP: 300.0000 mg | Freq: Every day | RECTAL | Status: DC

## 2015-11-14 MED ORDER — SODIUM CHLORIDE 0.9 % IV SOLN
INTRAVENOUS | Status: AC
  Administered 2015-11-15: 01:00:00 via INTRAVENOUS

## 2015-11-14 MED ORDER — ACETAMINOPHEN 325 MG PO TABS
650.0000 mg | ORAL_TABLET | Freq: Four times a day (QID) | ORAL | Status: DC | PRN
Start: 2015-11-14 — End: 2015-11-17
  Administered 2015-11-15: 325 mg via ORAL
  Administered 2015-11-16: 350 mg via ORAL
  Administered 2015-11-16: 325 mg via ORAL
  Administered 2015-11-17: 650 mg via ORAL
  Filled 2015-11-14 (×4): qty 2

## 2015-11-14 MED ORDER — CILIDINIUM-CHLORDIAZEPOXIDE 2.5-5 MG PO CAPS
1.0000 | ORAL_CAPSULE | Freq: Two times a day (BID) | ORAL | Status: DC | PRN
  Filled 2015-11-14: qty 1

## 2015-11-14 MED ORDER — CLOPIDOGREL BISULFATE 75 MG PO TABS
75.0000 mg | ORAL_TABLET | Freq: Every day | ORAL | Status: DC
  Administered 2015-11-15 – 2015-11-17 (×4): 75 mg via ORAL
  Filled 2015-11-14 (×4): qty 1

## 2015-11-14 MED ORDER — SENNOSIDES-DOCUSATE SODIUM 8.6-50 MG PO TABS: 1.0000 | ORAL_TABLET | Freq: Every evening | ORAL | Status: DC | PRN

## 2015-11-14 NOTE — ED Notes (Signed)
Yesterday patient weak in lower extremities and kept saying how bad she felt.  Gash to left forearm.  PT on Plavix.  At 0130 she called out in bathroom, she said she went down and did not think she fell.  Pt has been unsteady in gait for couple of days. Caregiver saw her at 1000 looking at paper and drinking coffee.  Pt is here with left sided weakness, complains of numbness in left arm. Left facial droop. PT reported a headache that had resolved

## 2015-11-14 NOTE — Consult Note (Signed)
Admission H&P    Chief Complaint: New-onset left-sided weakness.  HPI: Carmen Duncan is an 75 y.o. female booster hypertension, hyperlipidemia, stroke, pulmonary hypertension and breast cancer presenting with new onset left-sided weakness. Time of onset is unclear. She was noted to be weak yesterday and had some apparent difficulty going upstairs. She fell and injured her left arm earlier this morning. Family members noted left facial droop earlier today. She's been on Plavix 75 mg per day. CT scan of the head showed no acute intracranial abnormality. NIH stroke score was 4.  LSN: 11/13/2015, unclear time tPA Given: No: Unclear when patient was last known well mRankin:  Past Medical History  Diagnosis Date  . Arthritis   . Asthma   . Hypertension   . GERD (gastroesophageal reflux disease)   . Hyperlipidemia   . Diverticulosis   . IBS (irritable bowel syndrome)   . HOH (hard of hearing) left ear  . Acoustic neuroma ([place])   . Ejection fraction   . Breast cancer of upper-outer quadrant of right female breast ([place]) 07/19/2015  . Brain cancer ([place])   . Anxiety   . Stroke [facility])     no deficits, on plavix  . Essential tremor     on inderal  . Pneumonia     04-2014, CAP  . Interstitial lung disease [facility])     Past Surgical History  Procedure Laterality Date  . Cholecystectomy  2011  . Abdominal hysterectomy  1980  . Tonsillectomy  1945  . Biopsy breast    . Eyes  2007    cararacts  . Tee without cardioversion N/A 09/05/2014    Procedure: TRANSESOPHAGEAL ECHOCARDIOGRAM (TEE);  Surgeon: Lelon Perla, MD;  Location: [facility] ENDOSCOPY;  Service: Cardiovascular;  Laterality: N/A;  . Breast lumpectomy with radioactive seed and sentinel lymph node biopsy Right 08/10/2015    Procedure: BREAST LUMPECTOMY WITH RADIOACTIVE SEED AND SENTINEL LYMPH NODE BIOPSY;  Surgeon: Autumn Messing III, MD;  Location: [place];  Service: General;  Laterality: Right;    Family History  Problem  Relation Age of Onset  . Hyperlipidemia Mother   . Hypertension Mother   . Hyperlipidemia Father   . Heart disease Father 45  . Stroke Father   . Lung cancer Paternal Uncle     x 4   Social History:  reports that she has never smoked. She has never used smokeless tobacco. She reports that she drinks alcohol. She reports that she does not use illicit drugs.  Allergies:  Allergies  Allergen Reactions  . Latex Itching, Rash and Other (See Comments)    Red angry skin from latex tape  . Sulfonamide Derivatives Rash    hives    Medications: Preadmission medications are reviewed by me.  ROS: History obtained from the patient, her son and daughter-in-law.  General ROS: General weakness thought to be related to postop radiation therapy for breast cancer. Psychological ROS: negative for - behavioral disorder, hallucinations, memory difficulties, mood swings or suicidal ideation Ophthalmic ROS: negative for - blurry vision, double vision, eye pain or loss of vision ENT ROS: negative for - epistaxis, nasal discharge, oral lesions, sore throat, tinnitus or vertigo Allergy and Immunology ROS: negative for - hives or itchy/watery eyes Hematological and Lymphatic ROS: negative for - bleeding problems, bruising or swollen lymph nodes Endocrine ROS: negative for - galactorrhea, hair pattern changes, polydipsia/polyuria or temperature intolerance Respiratory ROS: negative for - cough, hemoptysis, shortness of breath or wheezing Cardiovascular ROS: negative  for - chest pain, dyspnea on exertion, edema or irregular heartbeat Gastrointestinal ROS: negative for - abdominal pain, diarrhea, hematemesis, nausea/vomiting or stool incontinence Genito-Urinary ROS: negative for - dysuria, hematuria, incontinence or urinary frequency/urgency Musculoskeletal ROS: negative for - joint swelling or muscular weakness Neurological ROS: as noted in HPI Dermatological ROS: negative for rash and skin lesion  changes  Physical Examination: Blood pressure 188/82, pulse 80, temperature 97.9 F (36.6 C), temperature source Oral, resp. rate 23, SpO2 100 %.  HEENT-  Normocephalic, no lesions, without obvious abnormality.  Normal external eye and conjunctiva.  Normal TM's bilaterally.  Normal auditory canals and external ears. Normal external nose, mucus membranes and septum.  Normal pharynx. Neck supple with no masses, nodes, nodules or enlargement. Cardiovascular - regular rate and rhythm, S1, S2 normal, no murmur, click, rub or gallop Lungs - chest clear, no wheezing, rales, normal symmetric air entry Abdomen - soft, non-tender; bowel sounds normal; no masses,  no organomegaly Extremities - no joint deformities, effusion, or inflammation and no edema  Neurologic Examination: Mental Status: Alert, oriented, no acute distress.  Speech was slightly slurred without evidence of aphasia. Able to follow commands without difficulty. Cranial Nerves: II-Visual fields were normal. III/IV/VI-Pupils were equal and reacted normally to light. Extraocular movements were full and conjugate.    V/VII-no facial numbness and no facial weakness. VIII-normal. X-mild dysarthria symmetrical palatal movement. XI: trapezius strength/neck flexion strength normal bilaterally XII-midline tongue extension with normal strength. Motor: Mild drift of left upper and lower extremities; motor exam otherwise unremarkable. Sensory: Normal throughout. Deep Tendon Reflexes: 1+ and symmetric. Plantars: Flexor bilaterally Cerebellar: Normal finger-to-nose testing. Carotid auscultation: Normal  Results for orders placed or performed during the hospital encounter of 11/14/15 (from the past 48 hour(s))  CBG monitoring, ED     Status: None   Collection Time: 11/14/15  6:45 PM  Result Value Ref Range   Glucose-Capillary 92 65 - 99 mg/dL  Protime-INR     Status: None   Collection Time: 11/14/15  6:54 PM  Result Value Ref Range    Prothrombin Time 12.8 11.6 - 15.2 seconds   INR 0.94 0.00 - 1.49  APTT     Status: None   Collection Time: 11/14/15  6:54 PM  Result Value Ref Range   aPTT 26 24 - 37 seconds  CBC     Status: None   Collection Time: 11/14/15  6:54 PM  Result Value Ref Range   WBC 5.0 4.0 - 10.5 K/uL   RBC 5.10 3.87 - 5.11 MIL/uL   Hemoglobin 15.0 12.0 - 15.0 g/dL   HCT 45.9 36.0 - 46.0 %   MCV 90.0 78.0 - 100.0 fL   MCH 29.4 26.0 - 34.0 pg   MCHC 32.7 30.0 - 36.0 g/dL   RDW 14.1 11.5 - 15.5 %   Platelets 198 150 - 400 K/uL  Differential     Status: None   Collection Time: 11/14/15  6:54 PM  Result Value Ref Range   Neutrophils Relative % 64 %   Neutro Abs 3.3 1.7 - 7.7 K/uL   Lymphocytes Relative 19 %   Lymphs Abs 0.9 0.7 - 4.0 K/uL   Monocytes Relative 10 %   Monocytes Absolute 0.5 0.1 - 1.0 K/uL   Eosinophils Relative 6 %   Eosinophils Absolute 0.3 0.0 - 0.7 K/uL   Basophils Relative 1 %   Basophils Absolute 0.0 0.0 - 0.1 K/uL  Comprehensive metabolic panel     Status: None  Collection Time: 11/14/15  6:54 PM  Result Value Ref Range   Sodium 137 135 - 145 mmol/L   Potassium 4.1 3.5 - 5.1 mmol/L   Chloride 107 101 - 111 mmol/L   CO2 22 22 - 32 mmol/L   Glucose, Bld 94 65 - 99 mg/dL   BUN 15 6 - 20 mg/dL   Creatinine, Ser 0.73 0.44 - 1.00 mg/dL   Calcium 9.4 8.9 - 10.3 mg/dL   Total Protein 7.4 6.5 - 8.1 g/dL   Albumin 3.6 3.5 - 5.0 g/dL   AST 31 15 - 41 U/L   ALT 20 14 - 54 U/L   Alkaline Phosphatase 75 38 - 126 U/L   Total Bilirubin 0.5 0.3 - 1.2 mg/dL   GFR calc non Af Amer >60 >60 mL/min   GFR calc Af Amer >60 >60 mL/min    Comment: (NOTE) The eGFR has been calculated using the CKD EPI equation. This calculation has not been validated in all clinical situations. eGFR's persistently <60 mL/min signify possible Chronic Kidney Disease.    Anion gap 8 5 - 15  I-stat troponin, ED     Status: None   Collection Time: 11/14/15  7:11 PM  Result Value Ref Range   Troponin i, poc  0.00 0.00 - 0.08 ng/mL   Comment 3            Comment: Due to the release kinetics of cTnI, a negative result within the first hours of the onset of symptoms does not rule out myocardial infarction with certainty. If myocardial infarction is still suspected, repeat the test at appropriate intervals.   I-Stat Chem 8, ED     Status: Abnormal   Collection Time: 11/14/15  7:13 PM  Result Value Ref Range   Sodium 139 135 - 145 mmol/L   Potassium 4.1 3.5 - 5.1 mmol/L   Chloride 108 101 - 111 mmol/L   BUN 17 6 - 20 mg/dL   Creatinine, Ser 0.60 0.44 - 1.00 mg/dL   Glucose, Bld 90 65 - 99 mg/dL   Calcium, Ion 1.08 (L) 1.13 - 1.30 mmol/L   TCO2 23 0 - 100 mmol/L   Hemoglobin 15.3 (H) 12.0 - 15.0 g/dL   HCT 45.0 36.0 - 46.0 %   Ct Head Wo Contrast  11/14/2015  CLINICAL DATA:  Acute onset of stroke-like symptoms. Initial encounter. EXAM: CT HEAD WITHOUT CONTRAST TECHNIQUE: Contiguous axial images were obtained from the base of the skull through the vertex without intravenous contrast. COMPARISON:  MRI of the brain performed 12/16/2014 FINDINGS: There is no evidence of acute infarction, mass lesion, or intra- or extra-axial hemorrhage on CT. Prominence of the ventricles and sulci reflects mild cortical volume loss. Scattered periventricular and subcortical white matter change likely reflects small vessel ischemic microangiopathy. The brainstem and fourth ventricle are within normal limits. The basal ganglia are unremarkable in appearance. The cerebral hemispheres demonstrate grossly normal gray-white differentiation. No mass effect or midline shift is seen. There is no evidence of fracture; visualized osseous structures are unremarkable in appearance. The orbits are within normal limits. The paranasal sinuses and mastoid air cells are well-aerated. No significant soft tissue abnormalities are seen. IMPRESSION: 1. No acute intracranial pathology seen on CT. 2. Mild cortical volume loss and scattered small  vessel ischemic microangiopathy. Electronically Signed   By: Garald Balding M.D.   On: 11/14/2015 19:46    Assessment: 75 y.o. female with multiple risk factors for stroke as well as history of  previous stroke presenting with probable right subcortical ischemic infarction.  Stroke Risk Factors - family history, hyperlipidemia and hypertension  Plan: 1. HgbA1c, fasting lipid panel 2. MRI, MRA  of the brain without contrast 3. PT consult, OT consult, Speech consult 4. Echocardiogram 5. Carotid dopplers 6. Prophylactic therapy-Antiplatelet med: Plavix  7. Risk factor modification 8. Telemetry monitoring  C.R. Nicole Kindred, MD [facility] [phone]  11/14/2015, 8:54 PM

## 2015-11-14 NOTE — ED Provider Notes (Signed)
CSN: [id number]     Arrival date & time 11/14/15  1808 History   First MD Initiated Contact with Patient 11/14/15 1928     Chief Complaint  Patient presents with  . Stroke Symptoms     (Consider location/radiation/quality/duration/timing/severity/associated sxs/prior Treatment) HPI Comments: Patient with history of occult left occipital stroke diagnosed on MRI in 11/2014 on Plavix, right-sided breast cancer status post surgery and radiation therapy, idiopathic pulmonary fibrosis recently started on [name] -- presents with acute onset of speech difficulty starting yesterday. No code stroke called as symptoms started greater than 8 hours ago. Patient also had weakness which the family assumed was due to her new medication and previous radiation therapy. She felt weak on her left side and she needed to use furniture to help support walking. She had thick slurred speech. Today, family noted left sided facial drooping which prompted emergency department evaluation. Patient had a fall at 1:30a.m. today and sustained a large bruise to her left upper arm. She was assisted back into bed by family. No head or neck injury suspected. Patient had a headache earlier as well as right shoulder pain, both have which resolved. No other acute complaints. Course is constant. Nothing makes symptoms better or worse.  The history is provided by the patient, medical records and a relative.    Past Medical History  Diagnosis Date  . Arthritis   . Asthma   . Hypertension   . GERD (gastroesophageal reflux disease)   . Hyperlipidemia   . Diverticulosis   . IBS (irritable bowel syndrome)   . HOH (hard of hearing) left ear  . Acoustic neuroma ([place])   . Ejection fraction   . Breast cancer of upper-outer quadrant of right female breast ([place]) 07/19/2015  . Brain cancer ([place])   . Anxiety   . Stroke [facility])     no deficits, on plavix  . Essential tremor     on inderal  . Pneumonia     04-2014, CAP  . Interstitial lung  disease [facility])    Past Surgical History  Procedure Laterality Date  . Cholecystectomy  2011  . Abdominal hysterectomy  1980  . Tonsillectomy  1945  . Biopsy breast    . Eyes  2007    cararacts  . Tee without cardioversion N/A 09/05/2014    Procedure: TRANSESOPHAGEAL ECHOCARDIOGRAM (TEE);  Surgeon: Lelon Perla, MD;  Location: [facility] ENDOSCOPY;  Service: Cardiovascular;  Laterality: N/A;  . Breast lumpectomy with radioactive seed and sentinel lymph node biopsy Right 08/10/2015    Procedure: BREAST LUMPECTOMY WITH RADIOACTIVE SEED AND SENTINEL LYMPH NODE BIOPSY;  Surgeon: Autumn Messing III, MD;  Location: [place];  Service: General;  Laterality: Right;   Family History  Problem Relation Age of Onset  . Hyperlipidemia Mother   . Hypertension Mother   . Hyperlipidemia Father   . Heart disease Father 28  . Stroke Father   . Lung cancer Paternal Uncle     x 4   Social History  Substance Use Topics  . Smoking status: Never Smoker   . Smokeless tobacco: Never Used  . Alcohol Use: 0.0 oz/week    0 Standard drinks or equivalent per week     Comment: wine about 3 times a week   OB History    No data available     Review of Systems  Constitutional: Negative for fever.  HENT: Negative for rhinorrhea and sore throat.   Eyes: Negative for redness.  Respiratory: Negative  for cough.   Cardiovascular: Negative for chest pain.  Gastrointestinal: Negative for nausea, vomiting, abdominal pain and diarrhea.  Genitourinary: Negative for dysuria.  Musculoskeletal: Positive for gait problem. Negative for myalgias.  Skin: Negative for rash.  Neurological: Positive for weakness and headaches (resolved).    Allergies  Latex and Sulfonamide derivatives  Home Medications   Prior to Admission medications   Medication Sig Start Date End Date Taking? Authorizing Provider  acetaminophen (TYLENOL) 325 MG tablet Take 650 mg by mouth every 6 (six) hours as needed for headache.     Historical Provider, MD  atorvastatin (LIPITOR) 20 MG tablet TAKE ONE TABLET BY MOUTH ONCE DAILY 07/05/15   [name], MD  clidinium-chlordiazePOXIDE (LIBRAX) 5-2.5 MG capsule Take 1 capsule by mouth 2 (two) times daily as needed. 11/03/15   [name], MD  clopidogrel (PLAVIX) 75 MG tablet Take 1 tablet (75 mg total) by mouth daily. 09/22/15   Rebecca S Tat, DO  pantoprazole (PROTONIX) 40 MG tablet TAKE ONE TABLET BY MOUTH ONCE DAILY 11/21/14   [name], MD  propranolol (INDERAL) 20 MG tablet Take 1 tablet (20 mg total) by mouth daily. 10/26/15   Rebecca S Tat, DO   BP 190/85 mmHg  Pulse 80  Temp(Src) 97.9 F (36.6 C) (Oral)  Resp 18  SpO2 100%   Physical Exam  Constitutional: She is oriented to person, place, and time. She appears well-developed and well-nourished.  HENT:  Head: Normocephalic and atraumatic.  Mouth/Throat: Oropharynx is clear and moist.  Eyes: Conjunctivae are normal. Right eye exhibits no discharge. Left eye exhibits no discharge.  Neck: Normal range of motion. Neck supple.  Cardiovascular: Normal rate, regular rhythm and normal heart sounds.   Pulmonary/Chest: Effort normal and breath sounds normal. No respiratory distress. She has no wheezes. She has no rales.  Abdominal: Soft. There is no tenderness.  Musculoskeletal: She exhibits no edema or tenderness.  Neurological: She is alert and oriented to person, place, and time. A cranial nerve deficit (L facial droop) and sensory deficit (tingling left arm and leg) is present. Coordination abnormal. GCS eye subscore is 4. GCS verbal subscore is 5. GCS motor subscore is 6.  Dysmetria L upper extremity. Trace weakness L upper and lower extremity.   Skin: Skin is warm and dry.  Psychiatric: She has a normal mood and affect.  Nursing note and vitals reviewed.   ED Course  Procedures (including critical care time) Labs Review Labs Reviewed  I-STAT CHEM 8, ED - Abnormal; Notable for the following:     Calcium, Ion 1.08 (*)    Hemoglobin 15.3 (*)    All other components within normal limits  PROTIME-INR  APTT  CBC  DIFFERENTIAL  COMPREHENSIVE METABOLIC PANEL  URINALYSIS, ROUTINE W REFLEX MICROSCOPIC (NOT AT [facility])  HEMOGLOBIN A1C  LIPID PANEL  I-STAT TROPOININ, ED  CBG MONITORING, ED    Imaging Review Ct Head Wo Contrast  11/14/2015  CLINICAL DATA:  Acute onset of stroke-like symptoms. Initial encounter. EXAM: CT HEAD WITHOUT CONTRAST TECHNIQUE: Contiguous axial images were obtained from the base of the skull through the vertex without intravenous contrast. COMPARISON:  MRI of the brain performed 12/16/2014 FINDINGS: There is no evidence of acute infarction, mass lesion, or intra- or extra-axial hemorrhage on CT. Prominence of the ventricles and sulci reflects mild cortical volume loss. Scattered periventricular and subcortical white matter change likely reflects small vessel ischemic microangiopathy. The brainstem and fourth ventricle are within normal limits. The basal  ganglia are unremarkable in appearance. The cerebral hemispheres demonstrate grossly normal gray-white differentiation. No mass effect or midline shift is seen. There is no evidence of fracture; visualized osseous structures are unremarkable in appearance. The orbits are within normal limits. The paranasal sinuses and mastoid air cells are well-aerated. No significant soft tissue abnormalities are seen. IMPRESSION: 1. No acute intracranial pathology seen on CT. 2. Mild cortical volume loss and scattered small vessel ischemic microangiopathy. Electronically Signed   By: Garald Balding M.D.   On: 11/14/2015 19:46   I have personally reviewed and evaluated these images and lab results as part of my medical decision-making.  ED ECG REPORT   Date: 11/14/2015  Rate: 79  Rhythm: normal sinus rhythm  QRS Axis: normal  Intervals: normal  ST/T Wave abnormalities: normal  Conduction Disutrbances:none  Narrative Interpretation:    Old EKG Reviewed: unchanged from 03/17  I have personally reviewed the EKG tracing and agree with the computerized printout as noted.   8:18 PM Patient seen and examined. Discussed with Dr. Jeanell Sparrow who will see. Will call neuro for reccs and admit to hospitalist. Pt is outside of tPA window as symptoms began yesterday.   Vital signs reviewed and are as follows: BP 190/85 mmHg  Pulse 80  Temp(Src) 97.9 F (36.6 C) (Oral)  Resp 18  SpO2 100%  8:25 PM Spoke with Dr. Nicole Kindred. Reccs MRI, MR angio brain. Will admit to medicine.   Dr. Myna Hidalgo to admit.   MDM   Final diagnoses:  Left hemiparesis (HCC)  Facial droop   Admit, suspected CVA.    Carlisle Cater, PA-C 11/14/15 2146  Pattricia Boss, MD 11/15/15 1325

## 2015-11-14 NOTE — H&P (Signed)
History and Physical    Carmen Duncan [id number] DOB: 05/16/1941 DOA: 11/14/2015  PCP: [name], MD   Patient coming from: Home   Chief Complaint: Left-sided weakness  HPI: Carmen Duncan is a 75 y.o. female with medical history significant for hypertension, hyperlipidemia, breast cancer status post  Resection and radiation,, essential tremor, interstitial lung disease, and remote stroke without residual deficits who presents to the ED with left-sided weakness that developed earlier today. Patient reports some bilateral lower extremity weakness and difficulty with gait for the past 2 days. This morning, she developed a right retro-orbital headache which resolved with Tylenol, but was followed by development of weakness involving the left upper and lower extremities and the left side of her face. There is been no recent trauma, chest pain, or palpitations. She denies vision or hearing change, confusion, loss of coordination, or numbness. She denies any recent fevers or chills, abdominal pain, vomiting, or diarrhea. She has been taking Plavix since the incidental discovery of her remote stroke on brain imaging  Last year.  ED Course: Upon arrival to the ED, patient is found to be afebrile, saturating well on room air, and with vital signs stable. Head CT was negative for acute intracranial abnormality and basic labs, including BMP and CBC are normal. INR 0.94 and troponin is 0.00. EKG demonstrates normal sinus rhythm. She has remained hemodynamically stable in the emergency department and in no apparent respiratory distress. Patient was evaluated by neurology and  It was advised that she be brought into the hospital for ongoing evaluation and management of suspected CVA.  Review of Systems:  All other systems reviewed and apart from HPI, are negative.  Past Medical History  Diagnosis Date  . Arthritis   . Asthma   . Hypertension   . GERD (gastroesophageal reflux disease)   .  Hyperlipidemia   . Diverticulosis   . IBS (irritable bowel syndrome)   . HOH (hard of hearing) left ear  . Acoustic neuroma ([place])   . Ejection fraction   . Breast cancer of upper-outer quadrant of right female breast ([place]) 07/19/2015  . Brain cancer ([place])   . Anxiety   . Stroke [facility])     no deficits, on plavix  . Essential tremor     on inderal  . Pneumonia     04-2014, CAP  . Interstitial lung disease [facility])     Past Surgical History  Procedure Laterality Date  . Cholecystectomy  2011  . Abdominal hysterectomy  1980  . Tonsillectomy  1945  . Biopsy breast    . Eyes  2007    cararacts  . Tee without cardioversion N/A 09/05/2014    Procedure: TRANSESOPHAGEAL ECHOCARDIOGRAM (TEE);  Surgeon: Lelon Perla, MD;  Location: [facility] ENDOSCOPY;  Service: Cardiovascular;  Laterality: N/A;  . Breast lumpectomy with radioactive seed and sentinel lymph node biopsy Right 08/10/2015    Procedure: BREAST LUMPECTOMY WITH RADIOACTIVE SEED AND SENTINEL LYMPH NODE BIOPSY;  Surgeon: Autumn Messing III, MD;  Location: [facility] [place];  Service: General;  Laterality: Right;     reports that she has never smoked. She has never used smokeless tobacco. She reports that she drinks alcohol. She reports that she does not use illicit drugs.  Allergies  Allergen Reactions  . Latex Itching, Rash and Other (See Comments)    Red angry skin from latex tape  . Sulfonamide Derivatives Rash    hives    Family History  Problem Relation Age of  Onset  . Hyperlipidemia Mother   . Hypertension Mother   . Hyperlipidemia Father   . Heart disease Father 56  . Stroke Father   . Lung cancer Paternal Uncle     x 4     Prior to Admission medications   Medication Sig Start Date End Date Taking? Authorizing Provider  acetaminophen (TYLENOL) 325 MG tablet Take 650 mg by mouth every 6 (six) hours as needed for headache.    Historical Provider, MD  atorvastatin (LIPITOR) 20 MG tablet TAKE ONE TABLET BY MOUTH ONCE  DAILY 07/05/15   [name], MD  clidinium-chlordiazePOXIDE (LIBRAX) 5-2.5 MG capsule Take 1 capsule by mouth 2 (two) times daily as needed. 11/03/15   [name], MD  clopidogrel (PLAVIX) 75 MG tablet Take 1 tablet (75 mg total) by mouth daily. 09/22/15   Rebecca S Tat, DO  pantoprazole (PROTONIX) 40 MG tablet TAKE ONE TABLET BY MOUTH ONCE DAILY 11/21/14   [name], MD  propranolol (INDERAL) 20 MG tablet Take 1 tablet (20 mg total) by mouth daily. 10/26/15   Eustace Quail Tat, DO    Physical Exam: Filed Vitals:   11/14/15 1833 11/14/15 2000 11/14/15 2030  BP: 190/85 172/74 188/82  Pulse: 80 76 80  Temp: 97.9 F (36.6 C)    TempSrc: Oral    Resp: 18 17 23   SpO2: 100% 99% 100%      Constitutional: NAD, calm, comfortable, left facial droop Eyes: PERTLA, lids and conjunctivae normal ENMT: Mucous membranes are moist. Posterior pharynx clear of any exudate or lesions.   Neck: normal, supple, no masses, no thyromegaly Respiratory: clear to auscultation bilaterally, no wheezing, no crackles. Normal respiratory effort.    Cardiovascular: S1 & S2 heard, regular rate and rhythm. No extremity edema. No significant JVD. Abdomen: No distension, no tenderness, no masses palpated. Bowel sounds normal.  Musculoskeletal: no clubbing / cyanosis. No joint deformity upper and lower extremities. Normal muscle tone.  Skin: no significant rashes, lesions, ulcers. Warm, dry, well-perfused. Ecchymosis to left arm. Neurologic: PERRL, EOMI, left facial droop. Left shoulder flexion, bicep flexion, and grip 4/5; LLE strength 5/5. Right upper and lower ext strength 5/5. Sensation to light touch intact throughout CN-V distributions and distal extremities x4, DTR normal.   Psychiatric: Normal judgment and insight. Alert and oriented x 3. Normal mood and affect.     Labs on Admission: I have personally reviewed following labs and imaging studies  CBC:  Recent Labs Lab 11/14/15 1854 11/14/15 1913    WBC 5.0  --   NEUTROABS 3.3  --   HGB 15.0 15.3*  HCT 45.9 45.0  MCV 90.0  --   PLT 198  --    Basic Metabolic Panel:  Recent Labs Lab 11/14/15 1854 11/14/15 1913  NA 137 139  K 4.1 4.1  CL 107 108  CO2 22  --   GLUCOSE 94 90  BUN 15 17  CREATININE 0.73 0.60  CALCIUM 9.4  --    GFR: Estimated Creatinine Clearance: 48.2 mL/min (by C-G formula based on Cr of 0.6). Liver Function Tests:  Recent Labs Lab 11/14/15 1854  AST 31  ALT 20  ALKPHOS 75  BILITOT 0.5  PROT 7.4  ALBUMIN 3.6   No results for input(s): LIPASE, AMYLASE in the last 168 hours. No results for input(s): AMMONIA in the last 168 hours. Coagulation Profile:  Recent Labs Lab 11/14/15 1854  INR 0.94   Cardiac Enzymes: No results for input(s): CKTOTAL,  CKMB, CKMBINDEX, TROPONINI in the last 168 hours. BNP (last 3 results) No results for input(s): PROBNP in the last 8760 hours. HbA1C: No results for input(s): HGBA1C in the last 72 hours. CBG:  Recent Labs Lab 11/14/15 1845  GLUCAP 92   Lipid Profile: No results for input(s): CHOL, HDL, LDLCALC, TRIG, CHOLHDL, LDLDIRECT in the last 72 hours. Thyroid Function Tests: No results for input(s): TSH, T4TOTAL, FREET4, T3FREE, THYROIDAB in the last 72 hours. Anemia Panel: No results for input(s): VITAMINB12, FOLATE, FERRITIN, TIBC, IRON, RETICCTPCT in the last 72 hours. Urine analysis:    Component Value Date/Time   COLORURINE AMBER* 04/18/2014 2205   APPEARANCEUR CLEAR 04/18/2014 2205   LABSPEC 1.018 04/18/2014 2205   PHURINE 5.5 04/18/2014 2205   GLUCOSEU NEGATIVE 04/18/2014 2205   HGBUR NEGATIVE 04/18/2014 2205   BILIRUBINUR NEGATIVE 04/18/2014 2205   BILIRUBINUR neg 10/02/2010 1108   KETONESUR NEGATIVE 04/18/2014 2205   PROTEINUR NEGATIVE 04/18/2014 2205   PROTEINUR neg 10/02/2010 1108   UROBILINOGEN 0.2 04/18/2014 2205   UROBILINOGEN 0.2 10/02/2010 1108   NITRITE NEGATIVE 04/18/2014 2205   NITRITE neg 10/02/2010 1108   LEUKOCYTESUR  NEGATIVE 04/18/2014 2205   Sepsis Labs: @LABRCNTIP (procalcitonin:4,lacticidven:4) )No results found for this or any previous visit (from the past 240 hour(s)).   Radiological Exams on Admission: Ct Head Wo Contrast  11/14/2015  CLINICAL DATA:  Acute onset of stroke-like symptoms. Initial encounter. EXAM: CT HEAD WITHOUT CONTRAST TECHNIQUE: Contiguous axial images were obtained from the base of the skull through the vertex without intravenous contrast. COMPARISON:  MRI of the brain performed 12/16/2014 FINDINGS: There is no evidence of acute infarction, mass lesion, or intra- or extra-axial hemorrhage on CT. Prominence of the ventricles and sulci reflects mild cortical volume loss. Scattered periventricular and subcortical white matter change likely reflects small vessel ischemic microangiopathy. The brainstem and fourth ventricle are within normal limits. The basal ganglia are unremarkable in appearance. The cerebral hemispheres demonstrate grossly normal gray-white differentiation. No mass effect or midline shift is seen. There is no evidence of fracture; visualized osseous structures are unremarkable in appearance. The orbits are within normal limits. The paranasal sinuses and mastoid air cells are well-aerated. No significant soft tissue abnormalities are seen. IMPRESSION: 1. No acute intracranial pathology seen on CT. 2. Mild cortical volume loss and scattered small vessel ischemic microangiopathy. Electronically Signed   By: Garald Balding M.D.   On: 11/14/2015 19:46    EKG: Independently reviewed. Normal sinus rhythm, anterior Q-wave  Assessment/Plan  1. Left-sided weakness  - Primary concern is for acute CVA  - Head CT with no acute intracranial findings  - Neurology is consulting and much appreciated  - MRI/MRA brain, carotid US, TTE, fasting lipid panel, A1c ordered  - Passed bedside swallow eval; PT, OT evals requested  - Monitor on telemetry  - Secondary ppx with Plavix  - VTE ppx  with sq Lovenox  2. Carotid artery disease, bilateral  - Pt has documented hx of such, though imaging not found in EMR  - Carotid dopplers ordered  - Continue Plavix, Lipitor    3. Hypertension  - Elevated to [id number] systolic in ED  - Plan to permit HTN for now in setting of suspected acute ischemic CVA  - Treat for SBP >220, or DBP >120    4. Idiopathic pulmonary fibrosis  - Followed by pulmonology in outpt setting  - Recently started on [name], will continue    5. Hyperlipidemia  - Continue current management with  Lipitor 20 mg qHS - Check fasting lipid panel in am and consider increasing dose as tolerated    6. GERD, IBS - Stable - Continue current management with Protonix and prn Librax   7. Essential tremor  - Stable  - Managed with propranolol; holding for now while permitting HTN    8. Breast cancer, right - Right breast, upper-outer quadrant, DCIS  - Underwent lumpectomy in March 2017 with sampling of 2 sentinel nodes; margins were clean and both LN's clear  - Completed adjuvant RT  - Under surveilence with oncology    DVT prophylaxis: sq Lovenox  Code Status: Full  Family Communication: Children updated at bedside   Disposition Plan: Observe on telemetry   Consults called: Neurology  Admission status: Observation     Vianne Bulls, MD [facility] Pager [phone]  If 7PM-7AM, please contact night-coverage [email or web address] Password [email or web address]  11/14/2015, 9:11 PM

## 2015-11-15 ENCOUNTER — Observation Stay (HOSPITAL_COMMUNITY): Payer: Medicare Other

## 2015-11-15 DIAGNOSIS — R29704 NIHSS score 4: Secondary | ICD-10-CM | POA: Diagnosis present

## 2015-11-15 DIAGNOSIS — K580 Irritable bowel syndrome with diarrhea: Secondary | ICD-10-CM | POA: Diagnosis not present

## 2015-11-15 DIAGNOSIS — I671 Cerebral aneurysm, nonruptured: Secondary | ICD-10-CM | POA: Diagnosis present

## 2015-11-15 DIAGNOSIS — I6330 Cerebral infarction due to thrombosis of unspecified cerebral artery: Secondary | ICD-10-CM | POA: Diagnosis not present

## 2015-11-15 DIAGNOSIS — R002 Palpitations: Secondary | ICD-10-CM | POA: Diagnosis present

## 2015-11-15 DIAGNOSIS — I639 Cerebral infarction, unspecified: Secondary | ICD-10-CM

## 2015-11-15 DIAGNOSIS — Z823 Family history of stroke: Secondary | ICD-10-CM | POA: Diagnosis not present

## 2015-11-15 DIAGNOSIS — C50411 Malignant neoplasm of upper-outer quadrant of right female breast: Secondary | ICD-10-CM | POA: Diagnosis not present

## 2015-11-15 DIAGNOSIS — R29810 Facial weakness: Secondary | ICD-10-CM | POA: Diagnosis present

## 2015-11-15 DIAGNOSIS — E785 Hyperlipidemia, unspecified: Secondary | ICD-10-CM | POA: Diagnosis not present

## 2015-11-15 DIAGNOSIS — E8809 Other disorders of plasma-protein metabolism, not elsewhere classified: Secondary | ICD-10-CM | POA: Diagnosis not present

## 2015-11-15 DIAGNOSIS — D333 Benign neoplasm of cranial nerves: Secondary | ICD-10-CM | POA: Diagnosis present

## 2015-11-15 DIAGNOSIS — I10 Essential (primary) hypertension: Secondary | ICD-10-CM | POA: Diagnosis not present

## 2015-11-15 DIAGNOSIS — I69398 Other sequelae of cerebral infarction: Secondary | ICD-10-CM | POA: Diagnosis not present

## 2015-11-15 DIAGNOSIS — J45909 Unspecified asthma, uncomplicated: Secondary | ICD-10-CM | POA: Diagnosis not present

## 2015-11-15 DIAGNOSIS — J84112 Idiopathic pulmonary fibrosis: Secondary | ICD-10-CM | POA: Diagnosis not present

## 2015-11-15 DIAGNOSIS — I6349 Cerebral infarction due to embolism of other cerebral artery: Secondary | ICD-10-CM | POA: Diagnosis present

## 2015-11-15 DIAGNOSIS — M6289 Other specified disorders of muscle: Secondary | ICD-10-CM

## 2015-11-15 DIAGNOSIS — I272 Other secondary pulmonary hypertension: Secondary | ICD-10-CM | POA: Diagnosis present

## 2015-11-15 DIAGNOSIS — K219 Gastro-esophageal reflux disease without esophagitis: Secondary | ICD-10-CM | POA: Diagnosis present

## 2015-11-15 DIAGNOSIS — I69354 Hemiplegia and hemiparesis following cerebral infarction affecting left non-dominant side: Secondary | ICD-10-CM | POA: Diagnosis not present

## 2015-11-15 DIAGNOSIS — Z923 Personal history of irradiation: Secondary | ICD-10-CM | POA: Diagnosis not present

## 2015-11-15 DIAGNOSIS — Z85841 Personal history of malignant neoplasm of brain: Secondary | ICD-10-CM | POA: Diagnosis not present

## 2015-11-15 DIAGNOSIS — R269 Unspecified abnormalities of gait and mobility: Secondary | ICD-10-CM | POA: Diagnosis not present

## 2015-11-15 DIAGNOSIS — Z8673 Personal history of transient ischemic attack (TIA), and cerebral infarction without residual deficits: Secondary | ICD-10-CM | POA: Diagnosis not present

## 2015-11-15 DIAGNOSIS — G250 Essential tremor: Secondary | ICD-10-CM | POA: Diagnosis not present

## 2015-11-15 DIAGNOSIS — Z8249 Family history of ischemic heart disease and other diseases of the circulatory system: Secondary | ICD-10-CM | POA: Diagnosis not present

## 2015-11-15 DIAGNOSIS — K589 Irritable bowel syndrome without diarrhea: Secondary | ICD-10-CM | POA: Diagnosis not present

## 2015-11-15 DIAGNOSIS — Z9049 Acquired absence of other specified parts of digestive tract: Secondary | ICD-10-CM | POA: Diagnosis not present

## 2015-11-15 DIAGNOSIS — Z882 Allergy status to sulfonamides status: Secondary | ICD-10-CM | POA: Diagnosis not present

## 2015-11-15 DIAGNOSIS — Z7902 Long term (current) use of antithrombotics/antiplatelets: Secondary | ICD-10-CM | POA: Diagnosis not present

## 2015-11-15 DIAGNOSIS — J849 Interstitial pulmonary disease, unspecified: Secondary | ICD-10-CM | POA: Diagnosis not present

## 2015-11-15 DIAGNOSIS — G8194 Hemiplegia, unspecified affecting left nondominant side: Secondary | ICD-10-CM | POA: Diagnosis not present

## 2015-11-15 DIAGNOSIS — F419 Anxiety disorder, unspecified: Secondary | ICD-10-CM | POA: Diagnosis present

## 2015-11-15 DIAGNOSIS — I5032 Chronic diastolic (congestive) heart failure: Secondary | ICD-10-CM | POA: Diagnosis not present

## 2015-11-15 DIAGNOSIS — H9190 Unspecified hearing loss, unspecified ear: Secondary | ICD-10-CM | POA: Diagnosis present

## 2015-11-15 DIAGNOSIS — Z91040 Latex allergy status: Secondary | ICD-10-CM | POA: Diagnosis not present

## 2015-11-15 DIAGNOSIS — I110 Hypertensive heart disease with heart failure: Secondary | ICD-10-CM | POA: Diagnosis not present

## 2015-11-15 DIAGNOSIS — Z79899 Other long term (current) drug therapy: Secondary | ICD-10-CM | POA: Diagnosis not present

## 2015-11-15 LAB — LIPID PANEL
CHOL/HDL RATIO: 3.8 ratio
Cholesterol: 132 mg/dL (ref 0–200)
HDL: 35 mg/dL — AB (ref >40)
LDL Cholesterol: 64 mg/dL (ref 0–99)
Triglycerides: 166 mg/dL — ABNORMAL HIGH (ref <150)
VLDL: 33 mg/dL (ref 0–40)

## 2015-11-15 LAB — GLUCOSE, CAPILLARY
GLUCOSE-CAPILLARY: 104 mg/dL — AB (ref 65–99)
GLUCOSE-CAPILLARY: 105 mg/dL — AB (ref 65–99)
GLUCOSE-CAPILLARY: 132 mg/dL — AB (ref 65–99)
GLUCOSE-CAPILLARY: 90 mg/dL (ref 65–99)
Glucose-Capillary: 80 mg/dL (ref 65–99)

## 2015-11-15 LAB — URINALYSIS, ROUTINE W REFLEX MICROSCOPIC
Bilirubin Urine: NEGATIVE
Glucose, UA: NEGATIVE mg/dL
Hgb urine dipstick: NEGATIVE
Ketones, ur: NEGATIVE mg/dL
Nitrite: NEGATIVE
PROTEIN: NEGATIVE mg/dL
SPECIFIC GRAVITY, URINE: 1.016 (ref 1.005–1.030)
pH: 5.5 (ref 5.0–8.0)

## 2015-11-15 LAB — URINE MICROSCOPIC-ADD ON

## 2015-11-15 MED ORDER — PROPRANOLOL HCL 20 MG PO TABS: 20.0000 mg | ORAL_TABLET | Freq: Every day | ORAL | Status: DC

## 2015-11-15 MED ORDER — PROPRANOLOL HCL 20 MG PO TABS
20.0000 mg | ORAL_TABLET | Freq: Every day | ORAL | Status: DC
Start: 2015-11-16 — End: 2015-11-17
  Administered 2015-11-16 – 2015-11-17 (×2): 20 mg via ORAL
  Filled 2015-11-15 (×2): qty 1

## 2015-11-15 NOTE — Consult Note (Signed)
Physical Medicine and Rehabilitation Consult Reason for Consult: Right corona radiata and right frontal periventricular white matter infarct Referring Physician: [facility]   HPI: Carmen Duncan is a 75 y.o. right handed female with history of hypertension, hyperlipidemia, breast cancer status post resection and radiation, essential tremor, remote stroke in the past without residual deficits maintained on Plavix. Patient lives with son and family. 2 level home with bedroom upstairs. Independent prior to admission. Presented 11/14/2015 a left-sided weakness. MRI of the brain showed acute 1 cm right corona radiata and right frontal periventricular white matter infarct. 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 MRA of the head with no emergent large vessel occlusion. Echocardiogram is pending. TEE also pending. Patient did not receive TPA. Neurology consulted presently remains on Plavix for CVA prophylaxis. Subcutaneous Lovenox for DVT prophylaxis. Physical and occupational therapy evaluation completed 11/15/2015 with recommendations of physical medicine rehabilitation consult  Patient has had  Therapy evaluation.  No functional deficits prior to admission.. Occasional short-term memory loss which is mild.  Review of Systems  Constitutional: Negative for fever and chills.  HENT: Negative for hearing loss.   Eyes: Negative for blurred vision and double vision.  Respiratory: Negative for cough and shortness of breath.   Cardiovascular: Negative for chest pain, palpitations and leg swelling.  Gastrointestinal: Positive for diarrhea and constipation. Negative for nausea and vomiting.       GERD  Genitourinary: Negative for dysuria and hematuria.  Musculoskeletal: Positive for myalgias and joint pain.  Skin: Negative for rash.  Neurological: Positive for tremors and weakness. Negative for seizures and headaches.  Psychiatric/Behavioral:       Anxiety  All other systems reviewed and are negative.  Past  Medical History  Diagnosis Date  . Arthritis   . Asthma   . Hypertension   . GERD (gastroesophageal reflux disease)   . Hyperlipidemia   . Diverticulosis   . IBS (irritable bowel syndrome)   . HOH (hard of hearing) left ear  . Acoustic neuroma ([place])   . Ejection fraction   . Breast cancer of upper-outer quadrant of right female breast ([place]) 07/19/2015  . Brain cancer ([place])   . Anxiety   . Stroke [facility])     no deficits, on plavix  . Essential tremor     on inderal  . Pneumonia     04-2014, CAP  . Interstitial lung disease [facility])    Past Surgical History  Procedure Laterality Date  . Cholecystectomy  2011  . Abdominal hysterectomy  1980  . Tonsillectomy  1945  . Biopsy breast    . Eyes  2007    cararacts  . Tee without cardioversion N/A 09/05/2014    Procedure: TRANSESOPHAGEAL ECHOCARDIOGRAM (TEE);  Surgeon: Lelon Perla, MD;  Location: [facility] ENDOSCOPY;  Service: Cardiovascular;  Laterality: N/A;  . Breast lumpectomy with radioactive seed and sentinel lymph node biopsy Right 08/10/2015    Procedure: BREAST LUMPECTOMY WITH RADIOACTIVE SEED AND SENTINEL LYMPH NODE BIOPSY;  Surgeon: Autumn Messing III, MD;  Location: [place];  Service: General;  Laterality: Right;   Family History  Problem Relation Age of Onset  . Hyperlipidemia Mother   . Hypertension Mother   . Hyperlipidemia Father   . Heart disease Father 11  . Stroke Father   . Lung cancer Paternal Uncle     x 4   Social History:  reports that she has never smoked. She has never used smokeless tobacco. She reports that she drinks  alcohol. She reports that she does not use illicit drugs. Allergies:  Allergies  Allergen Reactions  . Latex Itching, Rash and Other (See Comments)    Red angry skin from latex tape  . Sulfonamide Derivatives Rash    hives  . Tape Itching and Rash    Please use paper tape or coban wrap!!   Medications Prior to Admission  Medication Sig Dispense Refill  . acetaminophen  (TYLENOL) 325 MG tablet Take 650 mg by mouth every 6 (six) hours as needed for headache.    [name] atorvastatin (LIPITOR) 20 MG tablet TAKE ONE TABLET BY MOUTH ONCE DAILY 30 tablet 11  . clidinium-chlordiazePOXIDE (LIBRAX) 5-2.5 MG capsule Take 1 capsule by mouth 2 (two) times daily as needed. 60 capsule 3  . clopidogrel (PLAVIX) 75 MG tablet Take 1 tablet (75 mg total) by mouth daily. (Patient taking differently: Take 75 mg by mouth at bedtime. ) 30 tablet 5  . Nintedanib ([name]) 100 MG CAPS Take 100 mg by mouth 2 (two) times daily.    . pantoprazole (PROTONIX) 40 MG tablet TAKE ONE TABLET BY MOUTH ONCE DAILY 30 tablet 5  . propranolol (INDERAL) 20 MG tablet Take 1 tablet (20 mg total) by mouth daily. 30 tablet 6    Home: Home Living Family/patient expects to be discharged to:: Private residence Living Arrangements: Children Available Help at Discharge: Family, Available PRN/intermittently Type of Home: House Home Access: Stairs to enter Technical brewer of Steps: 4-5 Entrance Stairs-Rails: Right Home Layout: Two level, Bed/bath upstairs Alternate Level Stairs-Number of Steps: 1 flight Alternate Level Stairs-Rails: Right Bathroom Shower/Tub: Multimedia programmer: Handicapped height Home Equipment: Environmental consultant - 2 wheels, Shower seat - built in, [employer] - tub/shower  Functional History: Prior Function Level of Independence: Independent Comments: Cooks, cleans, drives. No falls reported. Functional Status:  Mobility: Bed Mobility General bed mobility comments: Pt OOB in chair upon arrival. Transfers Overall transfer level: Needs assistance Equipment used: Rolling walker (2 wheeled) Transfers: Sit to/from Stand Sit to Stand: Min guard General transfer comment: Min guard for safety. VCs for placement of L hand on RW. Ambulation/Gait Ambulation/Gait assistance: Min assist Ambulation Distance (Feet): 100 Feet Assistive device: 1 person hand held assist Gait  Pattern/deviations: Step-to pattern, Decreased dorsiflexion - left, Staggering left, Staggering right, Trunk flexed General Gait Details: Slow, unsteady gait with staggering left/right and decreased foot clearance LLE.  Gait velocity: decreased Gait velocity interpretation: Below normal speed for age/gender    ADL: ADL Overall ADL's : Needs assistance/impaired Eating/Feeding: Set up, Sitting Grooming: Minimal assistance, Standing, Oral care Upper Body Bathing: Minimal assitance, Sitting Lower Body Bathing: Minimal assistance, Sit to/from stand Upper Body Dressing : Minimal assistance, Sitting Lower Body Dressing: Minimal assistance, Sit to/from stand Toilet Transfer: Min guard, Ambulation, Cueing for sequencing, Cueing for safety, Comfort height toilet, RW Toilet Transfer Details (indicate cue type and reason): Pt running into things on her L side with functional mobility; VCs to correct. Toileting- Clothing Manipulation and Hygiene: Minimal assistance, Sit to/from stand Toileting - Clothing Manipulation Details (indicate cue type and reason): Assist to pull up L side of underwear. Functional mobility during ADLs: Min guard, Rolling walker, Cueing for safety, Cueing for sequencing General ADL Comments: Pt required consistent VCs for L hand placement on RW with functional mobility. Pt noted to be running into objects on L side during functional mobility in room; able to correct with VCs. Educated pt on proper positioning of L UE and continued use of LUE during  functional activities.  Cognition: Cognition Overall Cognitive Status: Impaired/Different from baseline Orientation Level: Oriented X4 Cognition Arousal/Alertness: Awake/alert Behavior During Therapy: WFL for tasks assessed/performed Overall Cognitive Status: Impaired/Different from baseline Area of Impairment: Memory, Safety/judgement Memory: Decreased short-term memory Safety/Judgement: Decreased awareness of safety, Decreased  awareness of deficits  Blood pressure 172/57, pulse 76, temperature 97.7 F (36.5 C), temperature source Oral, resp. rate 20, SpO2 98 %. Physical Exam  Constitutional: She is oriented to person, place, and time. She appears well-developed.  HENT:  Head: Normocephalic.  Eyes: EOM are normal.  Neck: Normal range of motion. Neck supple. No thyromegaly present.  Cardiovascular: Normal rate and regular rhythm.   Respiratory: Effort normal and breath sounds normal. No respiratory distress.  GI: Soft. Bowel sounds are normal. She exhibits no distension.  Neurological: She is alert and oriented to person, place, and time.  Follows full commands. Good awareness of deficits.  Skin: Skin is warm and dry.  5/5 strength in the right deltoid, biceps, triceps, grip, hip flexor, knee extensor, ankle dorsi flexor plantar flexion 4/5 in the left deltoid, biceps, triceps, grip, hip flexor, knee extensor, ankle dorsi flexor plantar flexor. Sensation intact  Results for orders placed or performed during the hospital encounter of 11/14/15 (from the past 24 hour(s))  CBG monitoring, ED     Status: None   Collection Time: 11/14/15  6:45 PM  Result Value Ref Range   Glucose-Capillary 92 65 - 99 mg/dL  Protime-INR     Status: None   Collection Time: 11/14/15  6:54 PM  Result Value Ref Range   Prothrombin Time 12.8 11.6 - 15.2 seconds   INR 0.94 0.00 - 1.49  APTT     Status: None   Collection Time: 11/14/15  6:54 PM  Result Value Ref Range   aPTT 26 24 - 37 seconds  CBC     Status: None   Collection Time: 11/14/15  6:54 PM  Result Value Ref Range   WBC 5.0 4.0 - 10.5 K/uL   RBC 5.10 3.87 - 5.11 MIL/uL   Hemoglobin 15.0 12.0 - 15.0 g/dL   HCT 45.9 36.0 - 46.0 %   MCV 90.0 78.0 - 100.0 fL   MCH 29.4 26.0 - 34.0 pg   MCHC 32.7 30.0 - 36.0 g/dL   RDW 14.1 11.5 - 15.5 %   Platelets 198 150 - 400 K/uL  Differential     Status: None   Collection Time: 11/14/15  6:54 PM  Result Value Ref Range    Neutrophils Relative % 64 %   Neutro Abs 3.3 1.7 - 7.7 K/uL   Lymphocytes Relative 19 %   Lymphs Abs 0.9 0.7 - 4.0 K/uL   Monocytes Relative 10 %   Monocytes Absolute 0.5 0.1 - 1.0 K/uL   Eosinophils Relative 6 %   Eosinophils Absolute 0.3 0.0 - 0.7 K/uL   Basophils Relative 1 %   Basophils Absolute 0.0 0.0 - 0.1 K/uL  Comprehensive metabolic panel     Status: None   Collection Time: 11/14/15  6:54 PM  Result Value Ref Range   Sodium 137 135 - 145 mmol/L   Potassium 4.1 3.5 - 5.1 mmol/L   Chloride 107 101 - 111 mmol/L   CO2 22 22 - 32 mmol/L   Glucose, Bld 94 65 - 99 mg/dL   BUN 15 6 - 20 mg/dL   Creatinine, Ser 0.73 0.44 - 1.00 mg/dL   Calcium 9.4 8.9 - 10.3 mg/dL   Total Protein  7.4 6.5 - 8.1 g/dL   Albumin 3.6 3.5 - 5.0 g/dL   AST 31 15 - 41 U/L   ALT 20 14 - 54 U/L   Alkaline Phosphatase 75 38 - 126 U/L   Total Bilirubin 0.5 0.3 - 1.2 mg/dL   GFR calc non Af Amer >60 >60 mL/min   GFR calc Af Amer >60 >60 mL/min   Anion gap 8 5 - 15  I-stat troponin, ED     Status: None   Collection Time: 11/14/15  7:11 PM  Result Value Ref Range   Troponin i, poc 0.00 0.00 - 0.08 ng/mL   Comment 3          I-Stat Chem 8, ED     Status: Abnormal   Collection Time: 11/14/15  7:13 PM  Result Value Ref Range   Sodium 139 135 - 145 mmol/L   Potassium 4.1 3.5 - 5.1 mmol/L   Chloride 108 101 - 111 mmol/L   BUN 17 6 - 20 mg/dL   Creatinine, Ser 0.60 0.44 - 1.00 mg/dL   Glucose, Bld 90 65 - 99 mg/dL   Calcium, Ion 1.08 (L) 1.13 - 1.30 mmol/L   TCO2 23 0 - 100 mmol/L   Hemoglobin 15.3 (H) 12.0 - 15.0 g/dL   HCT 45.0 36.0 - 46.0 %  Urinalysis, Routine w reflex microscopic (not at [facility]) *Canceled*     Status: None ()   Collection Time: 11/14/15  9:00 PM   Narrative   LIS Cancel (ORR/DE = Data Error)  Urinalysis, Routine w reflex microscopic (not at [facility])     Status: Abnormal   Collection Time: 11/14/15  9:35 PM  Result Value Ref Range   Color, Urine YELLOW YELLOW   APPearance CLEAR  CLEAR   Specific Gravity, Urine 1.016 1.005 - 1.030   pH 5.5 5.0 - 8.0   Glucose, UA NEGATIVE NEGATIVE mg/dL   Hgb urine dipstick NEGATIVE NEGATIVE   Bilirubin Urine NEGATIVE NEGATIVE   Ketones, ur NEGATIVE NEGATIVE mg/dL   Protein, ur NEGATIVE NEGATIVE mg/dL   Nitrite NEGATIVE NEGATIVE   Leukocytes, UA TRACE (A) NEGATIVE  Urine microscopic-add on     Status: Abnormal   Collection Time: 11/14/15  9:35 PM  Result Value Ref Range   Squamous Epithelial / LPF 0-5 (A) NONE SEEN   WBC, UA 0-5 0 - 5 WBC/hpf   RBC / HPF 0-5 0 - 5 RBC/hpf   Bacteria, UA FEW (A) NONE SEEN  Glucose, capillary     Status: Abnormal   Collection Time: 11/15/15 12:20 AM  Result Value Ref Range   Glucose-Capillary 105 (H) 65 - 99 mg/dL  Lipid panel     Status: Abnormal   Collection Time: 11/15/15  5:16 AM  Result Value Ref Range   Cholesterol 132 0 - 200 mg/dL   Triglycerides 166 (H) <150 mg/dL   HDL 35 (L) >40 mg/dL   Total CHOL/HDL Ratio 3.8 RATIO   VLDL 33 0 - 40 mg/dL   LDL Cholesterol 64 0 - 99 mg/dL  Glucose, capillary     Status: None   Collection Time: 11/15/15  7:25 AM  Result Value Ref Range   Glucose-Capillary 90 65 - 99 mg/dL   Comment 1 Notify RN    Comment 2 Document in Chart    Dg Chest 2 View  11/14/2015  CLINICAL DATA:  CEREBRUM , stroke EXAM: CHEST - 2 VIEW COMPARISON:  CT 09/04/2015 and previous FINDINGS: Coarse linear scarring or  subsegmental atelectasis in the lung bases, improved since previous radiograph of 06/08/2014. No confluent airspace disease or overt edema. Heart size upper limits normal. Atheromatous aorta, mildly tortuous. No effusion.  No pneumothorax.  Surgical clips right axilla. Anterior vertebral endplate spurring at multiple levels in the mid thoracic spine. IMPRESSION: 1. Coarse probably chronic bibasilar interstitial opacities. No definite acute or superimposed abnormality. 2.  Aortic Atherosclerosis (ICD10-170.0) Electronically Signed   By: Lucrezia Europe M.D.   On:  11/14/2015 22:42   Ct Head Wo Contrast  11/14/2015  CLINICAL DATA:  Acute onset of stroke-like symptoms. Initial encounter. EXAM: CT HEAD WITHOUT CONTRAST TECHNIQUE: Contiguous axial images were obtained from the base of the skull through the vertex without intravenous contrast. COMPARISON:  MRI of the brain performed 12/16/2014 FINDINGS: There is no evidence of acute infarction, mass lesion, or intra- or extra-axial hemorrhage on CT. Prominence of the ventricles and sulci reflects mild cortical volume loss. Scattered periventricular and subcortical white matter change likely reflects small vessel ischemic microangiopathy. The brainstem and fourth ventricle are within normal limits. The basal ganglia are unremarkable in appearance. The cerebral hemispheres demonstrate grossly normal gray-white differentiation. No mass effect or midline shift is seen. There is no evidence of fracture; visualized osseous structures are unremarkable in appearance. The orbits are within normal limits. The paranasal sinuses and mastoid air cells are well-aerated. No significant soft tissue abnormalities are seen. IMPRESSION: 1. No acute intracranial pathology seen on CT. 2. Mild cortical volume loss and scattered small vessel ischemic microangiopathy. Electronically Signed   By: Garald Balding M.D.   On: 11/14/2015 19:46   Mr Angiogram Head Wo Contrast  11/14/2015  CLINICAL DATA:  New onset LEFT-sided weakness, possibly beginning yesterday. LEFT facial droop today. History of hypertension, hyperlipidemia, stroke, breast cancer. EXAM: MRI HEAD WITHOUT CONTRAST MRA HEAD WITHOUT CONTRAST TECHNIQUE: Multiplanar, multiecho pulse sequences of the brain and surrounding structures were obtained without intravenous contrast. Angiographic images of the head were obtained using MRA technique without contrast. COMPARISON:  CT HEAD November 14, 2015 at 1920 hours and MRI of the brain December 16, 2014 and MRA head August 23, 2014. FINDINGS: MRI HEAD  FINDINGS INTRACRANIAL CONTENTS: 1 cm rounded area of reduced diffusion RIGHT corona radiata. 1 cm reduced diffusion RIGHT frontal periventricular white matter/ body corpus callosum. Both areas demonstrate low ADC values. No susceptibility artifact to suggest hemorrhage. The ventricles and sulci are mildly prominent for patient's age. Patchy to confluent supratentorial and pontine white matter FLAIR T2 hyperintensities. Small area LEFT mesial occipital lobe encephalomalacia. No suspicious parenchymal signal, masses or mass effect. No abnormal extra-axial fluid collections. No extra-axial masses though, contrast enhanced sequences would be more sensitive. Normal major intracranial vascular flow voids present at skull base. ORBITS: The included ocular globes and orbital contents are non-suspicious. Status post bilateral ocular lens implants. SINUSES: The mastoid air-cells and included paranasal sinuses are well-aerated. Known LEFT internal auditory canal schwannoma not conspicuous by noncontrast examination. SKULL/SOFT TISSUES: No abnormal sellar expansion. No suspicious calvarial bone marrow signal. Craniocervical junction maintained. MRA HEAD FINDINGS ANTERIOR CIRCULATION: Normal flow related enhancement of the included cervical, petrous, cavernous and supraclinoid internal carotid arteries. 2 mm laterally directed wide necked aneurysm RIGHT cavernous internal carotid artery is better seen on this high field strength examination. Patent anterior communicating artery. Normal flow related enhancement of the anterior and middle cerebral arteries, including distal segments. No large vessel occlusion, high-grade stenosis. Moderate luminal regularity of the middle cerebral arteries. POSTERIOR CIRCULATION: Codominant vertebral  artery's. Basilar artery is patent, with normal flow related enhancement of the main branch vessels. Normal flow related enhancement of the posterior cerebral arteries. Moderate stenosis proximal  RIGHT P3 segment. Moderate stenosis LEFT P3. Robust RIGHT posterior communicating artery. Luminal irregularity of the posterior cerebral arteries. No large vessel occlusion, aneurysm. IMPRESSION: MRI HEAD: Acute 1 cm RIGHT corona radiata and RIGHT frontal periventricular white matter infarcts. Stable moderate global brain atrophy and moderate to severe chronic small vessel ischemic disease. 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 MRA HEAD: No emergent large vessel occlusion. 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 Moderate stenoses of the posterior cerebral arteries and general luminal irregularity of the intracranial vessels compatible with atherosclerosis. Electronically Signed   By: Elon Alas M.D.   On: 11/14/2015 22:56   Mr Brain Wo Contrast  11/14/2015  CLINICAL DATA:  New onset LEFT-sided weakness, possibly beginning yesterday. LEFT facial droop today. History of hypertension, hyperlipidemia, stroke, breast cancer. EXAM: MRI HEAD WITHOUT CONTRAST MRA HEAD WITHOUT CONTRAST TECHNIQUE: Multiplanar, multiecho pulse sequences of the brain and surrounding structures were obtained without intravenous contrast. Angiographic images of the head were obtained using MRA technique without contrast. COMPARISON:  CT HEAD November 14, 2015 at 1920 hours and MRI of the brain December 16, 2014 and MRA head August 23, 2014. FINDINGS: MRI HEAD FINDINGS INTRACRANIAL CONTENTS: 1 cm rounded area of reduced diffusion RIGHT corona radiata. 1 cm reduced diffusion RIGHT frontal periventricular white matter/ body corpus callosum. Both areas demonstrate low ADC values. No susceptibility artifact to suggest hemorrhage. The ventricles and sulci are mildly prominent for patient's age. Patchy to confluent supratentorial and pontine white matter FLAIR T2 hyperintensities. Small area LEFT mesial occipital lobe encephalomalacia. No suspicious parenchymal signal, masses or mass effect. No abnormal extra-axial fluid collections. No  extra-axial masses though, contrast enhanced sequences would be more sensitive. Normal major intracranial vascular flow voids present at skull base. ORBITS: The included ocular globes and orbital contents are non-suspicious. Status post bilateral ocular lens implants. SINUSES: The mastoid air-cells and included paranasal sinuses are well-aerated. Known LEFT internal auditory canal schwannoma not conspicuous by noncontrast examination. SKULL/SOFT TISSUES: No abnormal sellar expansion. No suspicious calvarial bone marrow signal. Craniocervical junction maintained. MRA HEAD FINDINGS ANTERIOR CIRCULATION: Normal flow related enhancement of the included cervical, petrous, cavernous and supraclinoid internal carotid arteries. 2 mm laterally directed wide necked aneurysm RIGHT cavernous internal carotid artery is better seen on this high field strength examination. Patent anterior communicating artery. Normal flow related enhancement of the anterior and middle cerebral arteries, including distal segments. No large vessel occlusion, high-grade stenosis. Moderate luminal regularity of the middle cerebral arteries. POSTERIOR CIRCULATION: Codominant vertebral artery's. Basilar artery is patent, with normal flow related enhancement of the main branch vessels. Normal flow related enhancement of the posterior cerebral arteries. Moderate stenosis proximal RIGHT P3 segment. Moderate stenosis LEFT P3. Robust RIGHT posterior communicating artery. Luminal irregularity of the posterior cerebral arteries. No large vessel occlusion, aneurysm. IMPRESSION: MRI HEAD: Acute 1 cm RIGHT corona radiata and RIGHT frontal periventricular white matter infarcts. Stable moderate global brain atrophy and moderate to severe chronic small vessel ischemic disease. 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 MRA HEAD: No emergent large vessel occlusion. 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 Moderate stenoses of the posterior cerebral arteries  and general luminal irregularity of the intracranial vessels compatible with atherosclerosis. Electronically Signed   By: Elon Alas M.D.   On: 11/14/2015 22:56    Assessment/Plan: Diagnosis: Right corona  radiata and lenticulostriate infarct causing left hemiparesis 1. Does the need for close, 24 hr/day medical supervision in concert with the patient's rehab needs make it unreasonable for this patient to be served in a less intensive setting? Yes 2. Co-Morbidities requiring supervision/potential complications: Hypertension, hyperlipidemia 3. Due to bladder management, bowel management, safety, skin/wound care, disease management, medication administration, pain management and patient education, does the patient require 24 hr/day rehab nursing? Yes 4. Does the patient require coordinated care of a physician, rehab nurse, PT (1-2 hrs/day, 5 days/week), OT (1-2 hrs/day, 5 days/week) and SLP (.5-1 hrs/day, 5 days/week) to address physical and functional deficits in the context of the above medical diagnosis(es)? Yes Addressing deficits in the following areas: balance, endurance, locomotion, strength, transferring, bowel/bladder control, bathing, dressing, feeding, grooming, toileting, cognition and speech 5. Can the patient actively participate in an intensive therapy program of at least 3 hrs of therapy per day at least 5 days per week? Yes 6. The potential for patient to make measurable gains while on inpatient rehab is good 7. Anticipated functional outcomes upon discharge from inpatient rehab are modified independent  with PT, modified independent with OT, modified independent with SLP. 8. Estimated rehab length of stay to reach the above functional goals is: 7d 9. Does the patient have adequate social supports and living environment to accommodate these discharge functional goals? Yes 10. Anticipated D/C setting: Home 11. Anticipated post D/C treatments: [place] therapy 12. Overall  Rehab/Functional Prognosis: excellent  RECOMMENDATIONS: This patient's condition is appropriate for continued rehabilitative care in the following setting: CIR if still requiring some physical assistance after workup is complete Patient has agreed to participate in recommended program. Yes Note that insurance prior authorization may be required for reimbursement for recommended care.  Comment:     11/15/2015

## 2015-11-15 NOTE — Evaluation (Signed)
Physical Therapy Evaluation Patient Details Name: Carmen Duncan MRN: [id number] DOB: 25-May-1941 Today's Date: 11/15/2015   History of Present Illness  Patient is a 75 y/o female wtih hx of HTN, asthma, PNA ,breast cancer s/p Resection and radiation, essential tremor, interstitial lung disease, and remote stroke without residual deficits presents with left sided weakness. MRI-Acute 1 cm RIGHT corona radiata and RIGHT frontal periventricular white matter infarcts.  Clinical Impression  Patient presents with left sided weakness and impaired balance s/p CVA impacting mobility. Pt independent PTA and drives. Tolerated gait training with Min A for safety due to decreased foot clearance and imbalance. Family concerned about pt's memory recently and ability to manage medications. Pt highly motivated to return to independence. Great CIR candidate. Will follow acutely to maximize independence and mobility prior to return home.    Follow Up Recommendations CIR    Equipment Recommendations  Other (comment) (TBA)    Recommendations for Other Services Rehab consult     Precautions / Restrictions Precautions Precautions: Fall Restrictions Weight Bearing Restrictions: No      Mobility  Bed Mobility               General bed mobility comments: Walking out of bathroom with tech PTA.  Transfers Overall transfer level: Needs assistance Equipment used: 1 person hand held assist Transfers: Sit to/from Stand Sit to Stand: Min guard         General transfer comment: Min guard for safety. Assist for steadying in standing.  Ambulation/Gait Ambulation/Gait assistance: Min assist Ambulation Distance (Feet): 100 Feet Assistive device: 1 person hand held assist Gait Pattern/deviations: Step-to pattern;Decreased dorsiflexion - left;Staggering left;Staggering right;Trunk flexed Gait velocity: decreased Gait velocity interpretation: Below normal speed for age/gender General Gait Details: Slow,  unsteady gait with staggering left/right and decreased foot clearance LLE.   Stairs            Wheelchair Mobility    Modified Rankin (Stroke Patients Only) Modified Rankin (Stroke Patients Only) Pre-Morbid Rankin Score: Slight disability Modified Rankin: Moderately severe disability     Balance Overall balance assessment: Needs assistance Sitting-balance support: Feet supported;No upper extremity supported Sitting balance-Leahy Scale: Good     Standing balance support: During functional activity Standing balance-Leahy Scale: Fair Standing balance comment: Able to brush teeth and wash face standing at sink with Min guard assist for safety and assist with setup.                             Pertinent Vitals/Pain Pain Assessment: No/denies pain    Home Living Family/patient expects to be discharged to:: Private residence Living Arrangements: Children Available Help at Discharge: Family;Available PRN/intermittently (son works here at [employer], has 3 grand children, 2 home for summer) Type of Home: House Home Access: Stairs to enter Entrance Stairs-Rails: Right Entrance Stairs-Number of Steps: 4-5 Home Layout: Two level;Bed/bath upstairs Home Equipment: Walker - 2 wheels      Prior Function Level of Independence: Independent         Comments: Cooks, cleans, drives. No falls reported.     Hand Dominance   Dominant Hand: Right    Extremity/Trunk Assessment   Upper Extremity Assessment: Defer to OT evaluation           Lower Extremity Assessment: LLE deficits/detail   LLE Deficits / Details: Grossly ~3/5 throughout LLE     Communication   Communication: No difficulties  Cognition Arousal/Alertness: Awake/alert Behavior During Therapy: [facility] for  tasks assessed/performed Overall Cognitive Status: No family/caregiver present to determine baseline cognitive functioning (A&O x3.)       Memory: Decreased short-term memory (Son reports recent  memory deficits. Not sure if she is taking all her medications correctly)              General Comments General comments (skin integrity, edema, etc.): Son present towards end of session and expressing concern about mother's memory and medication management.    Exercises        Assessment/Plan    PT Assessment Patient needs continued PT services  PT Diagnosis Difficulty walking;Abnormality of gait   PT Problem List Decreased strength;Decreased activity tolerance;Decreased range of motion;Decreased cognition;Decreased balance;Decreased mobility;Decreased safety awareness  PT Treatment Interventions Balance training;Gait training;Functional mobility training;Therapeutic activities;Therapeutic exercise;Patient/family education;Stair training;DME instruction;Neuromuscular re-education   PT Goals (Current goals can be found in the Care Plan section) Acute Rehab PT Goals Patient Stated Goal: to go home and return to independence PT Goal Formulation: With patient Time For Goal Achievement: 11/29/15 Potential to Achieve Goals: Good    Frequency Min 4X/week   Barriers to discharge Decreased caregiver support;Inaccessible home environment stairs to enter home and get to bedroom; son works.    Co-evaluation               End of Session Equipment Utilized During Treatment: Gait belt Activity Tolerance: Patient tolerated treatment well Patient left: in chair;with call bell/phone within reach;with chair alarm set;with family/visitor present Nurse Communication: Mobility status    Functional Assessment Tool Used: clinical judgment Functional Limitation: Mobility: Walking and moving around Mobility: Walking and Moving Around Current Status [id number]): At least 20 percent but less than 40 percent impaired, limited or restricted Mobility: Walking and Moving Around Goal Status [phone]): At least 1 percent but less than 20 percent impaired, limited or restricted    Time: 0852-0915 PT  Time Calculation (min) (ACUTE ONLY): 23 min   Charges:   PT Evaluation $PT Eval Moderate Complexity: 1 Procedure PT Treatments $Gait Training: 8-22 mins   PT G Codes:   PT G-Codes **NOT FOR INPATIENT CLASS** Functional Assessment Tool Used: clinical judgment Functional Limitation: Mobility: Walking and moving around Mobility: Walking and Moving Around Current Status [id number]): At least 20 percent but less than 40 percent impaired, limited or restricted Mobility: Walking and Moving Around Goal Status [phone]): At least 1 percent but less than 20 percent impaired, limited or restricted    [place] 11/15/2015, 10:17 AM Wray Kearns, PT, DPT [phone]

## 2015-11-15 NOTE — Telephone Encounter (Signed)
Denial letter has been received and has been placed in Dr. Bari Mantis folder for him to look at.

## 2015-11-15 NOTE — Progress Notes (Addendum)
PROGRESS NOTE  Carmen Duncan [id number] DOB: December 29, 1940 DOA: 11/14/2015 PCP: [name], MD  Brief History:  75 y.o. female with hx of hypertension, hyperlipidemia, stroke, pulmonary hypertension and breast cancer presenting with new onset left-sided weakness. Time of onset is unclear (LKW 11/13/2015). She was noted to be weak yesterday and had some apparent difficulty going upstairs. She fell and injured her left arm earlier this morning. Family members noted left facial droop earlier on 6/20. She's been on Plavix 75 mg per day. CT scan of the head showed no acute intracranial abnormality. Neurology was consulted, and full stroke workup was taken.  Assessment/Plan: 1. Acute stroke right corona radiata and right frontal periventricular area  -Neurology Consult appreciated -PT/OT evaluation -Speech therapy eval -CT brain--neg -MRI brain-R corona radiata and R frontal PV white matter infarcts  -MRA brain--No large vessel occlusion. Stable small R ICA aneurysm. Intracranial atherosclerosis -Carotid Duplex--no hemodynamically significant stenosis -Echo--pending -LDL--64 -HbA1C--pending -TEE planned 6/22 -personally reviewed EKG--sinus with nonspecific ST changes  2. Hypertension  - plan to restart propranolol on 6/22 - Plan to permit HTN for now in setting of suspected acute ischemic CVA  - Treat for SBP >220, or DBP >120  -am bmp  3. Idiopathic pulmonary fibrosis  - Followed by pulmonology in outpt setting  - Recently started on [name], will continue  -stable on RA  4. Hyperlipidemia  - Continue current management with Lipitor 20 mg qHS - LDL 64  5. GERD, IBS - Stable - Continue current management with Protonix and prn Librax   6. Essential tremor  - Stable  - Managed with propranolol   7. Breast cancer, right - Right breast, upper-outer quadrant, DCIS  - Underwent lumpectomy in March 2017 with sampling of 2 sentinel nodes; margins were clean  and both LN's clear  - Completed adjuvant RT  - Under surveilence with oncology    Disposition Plan:   CIR 1-2 days  Family Communication:   Family updated at bedside   Consultants:  Neurology, cardiology  Code Status:  FULL  DVT Prophylaxis:   [place] Lovenox   Procedures: As Listed in Progress Note Above  Antibiotics: None    Subjective: Patient feels that her speech is better but not back to normal. Denies any headache, visual disturbance, chest pain, short of breath, nausea, vomiting, diarrhea, no pain. No dysuria or hematuria. No rashes.  Objective: Filed Vitals:   11/15/15 0800 11/15/15 1158 11/15/15 1519 11/15/15 1751  BP: 167/84 181/66 164/79 166/77  Pulse:  79 84 86  Temp: 97.2 F (36.2 C) 97.7 F (36.5 C) 98.4 F (36.9 C) 97.5 F (36.4 C)  TempSrc: Oral Oral Oral Oral  Resp: 18 18 18 19   SpO2: 99%  100% 100%    Intake/Output Summary (Last 24 hours) at 11/15/15 1910 Last data filed at 11/15/15 0500  Gross per 24 hour  Intake    200 ml  Output      0 ml  Net    200 ml   Weight change:  Exam:   General:  Pt is alert, follows commands appropriately, not in acute distress  HEENT: No icterus, No thrush, No neck mass, [place]/AT  Cardiovascular: RRR, S1/S2, no rubs, no gallops  Respiratory: CTA bilaterally, no wheezing, no crackles, no rhonchi  Abdomen: Soft/+BS, non tender, non distended, no guarding  Extremities: No edema, No lymphangitis, No petechiae, No rashes, no synovitis   Data Reviewed: I have personally  reviewed following labs and imaging studies Basic Metabolic Panel:  Recent Labs Lab 11/14/15 1854 11/14/15 1913  NA 137 139  K 4.1 4.1  CL 107 108  CO2 22  --   GLUCOSE 94 90  BUN 15 17  CREATININE 0.73 0.60  CALCIUM 9.4  --    Liver Function Tests:  Recent Labs Lab 11/14/15 1854  AST 31  ALT 20  ALKPHOS 75  BILITOT 0.5  PROT 7.4  ALBUMIN 3.6   No results for input(s): LIPASE, AMYLASE in the last 168 hours. No results  for input(s): AMMONIA in the last 168 hours. Coagulation Profile:  Recent Labs Lab 11/14/15 1854  INR 0.94   CBC:  Recent Labs Lab 11/14/15 1854 11/14/15 1913  WBC 5.0  --   NEUTROABS 3.3  --   HGB 15.0 15.3*  HCT 45.9 45.0  MCV 90.0  --   PLT 198  --    Cardiac Enzymes: No results for input(s): CKTOTAL, CKMB, CKMBINDEX, TROPONINI in the last 168 hours. BNP: Invalid input(s): POCBNP CBG:  Recent Labs Lab 11/14/15 1845 11/15/15 0020 11/15/15 0725 11/15/15 1144 11/15/15 1624  GLUCAP 92 105* 90 80 104*   HbA1C: No results for input(s): HGBA1C in the last 72 hours. Urine analysis:    Component Value Date/Time   COLORURINE YELLOW 11/14/2015 2135   APPEARANCEUR CLEAR 11/14/2015 2135   LABSPEC 1.016 11/14/2015 2135   PHURINE 5.5 11/14/2015 2135   GLUCOSEU NEGATIVE 11/14/2015 2135   HGBUR NEGATIVE 11/14/2015 2135   BILIRUBINUR NEGATIVE 11/14/2015 2135   BILIRUBINUR neg 10/02/2010 1108   KETONESUR NEGATIVE 11/14/2015 2135   PROTEINUR NEGATIVE 11/14/2015 2135   PROTEINUR neg 10/02/2010 1108   UROBILINOGEN 0.2 04/18/2014 2205   UROBILINOGEN 0.2 10/02/2010 1108   NITRITE NEGATIVE 11/14/2015 2135   NITRITE neg 10/02/2010 1108   LEUKOCYTESUR TRACE* 11/14/2015 2135   Sepsis Labs: @LABRCNTIP (procalcitonin:4,lacticidven:4) )No results found for this or any previous visit (from the past 240 hour(s)).   Scheduled Meds: . atorvastatin  20 mg Oral Daily  . clopidogrel  75 mg Oral Daily  . enoxaparin (LOVENOX) injection  40 mg Subcutaneous Q24H  . Nintedanib  100 mg Oral BID  . pantoprazole  40 mg Oral Daily  . [START ON 11/16/2015] propranolol  20 mg Oral Daily   Continuous Infusions:   Procedures/Studies: Dg Chest 2 View  11/14/2015  CLINICAL DATA:  CEREBRUM , stroke EXAM: CHEST - 2 VIEW COMPARISON:  CT 09/04/2015 and previous FINDINGS: Coarse linear scarring or subsegmental atelectasis in the lung bases, improved since previous radiograph of 06/08/2014. No  confluent airspace disease or overt edema. Heart size upper limits normal. Atheromatous aorta, mildly tortuous. No effusion.  No pneumothorax.  Surgical clips right axilla. Anterior vertebral endplate spurring at multiple levels in the mid thoracic spine. IMPRESSION: 1. Coarse probably chronic bibasilar interstitial opacities. No definite acute or superimposed abnormality. 2.  Aortic Atherosclerosis (ICD10-170.0) Electronically Signed   By: Lucrezia Europe M.D.   On: 11/14/2015 22:42   Ct Head Wo Contrast  11/14/2015  CLINICAL DATA:  Acute onset of stroke-like symptoms. Initial encounter. EXAM: CT HEAD WITHOUT CONTRAST TECHNIQUE: Contiguous axial images were obtained from the base of the skull through the vertex without intravenous contrast. COMPARISON:  MRI of the brain performed 12/16/2014 FINDINGS: There is no evidence of acute infarction, mass lesion, or intra- or extra-axial hemorrhage on CT. Prominence of the ventricles and sulci reflects mild cortical volume loss. Scattered periventricular and subcortical white matter change  likely reflects small vessel ischemic microangiopathy. The brainstem and fourth ventricle are within normal limits. The basal ganglia are unremarkable in appearance. The cerebral hemispheres demonstrate grossly normal gray-white differentiation. No mass effect or midline shift is seen. There is no evidence of fracture; visualized osseous structures are unremarkable in appearance. The orbits are within normal limits. The paranasal sinuses and mastoid air cells are well-aerated. No significant soft tissue abnormalities are seen. IMPRESSION: 1. No acute intracranial pathology seen on CT. 2. Mild cortical volume loss and scattered small vessel ischemic microangiopathy. Electronically Signed   By: Garald Balding M.D.   On: 11/14/2015 19:46   Mr Angiogram Head Wo Contrast  11/14/2015  CLINICAL DATA:  New onset LEFT-sided weakness, possibly beginning yesterday. LEFT facial droop today. History  of hypertension, hyperlipidemia, stroke, breast cancer. EXAM: MRI HEAD WITHOUT CONTRAST MRA HEAD WITHOUT CONTRAST TECHNIQUE: Multiplanar, multiecho pulse sequences of the brain and surrounding structures were obtained without intravenous contrast. Angiographic images of the head were obtained using MRA technique without contrast. COMPARISON:  CT HEAD November 14, 2015 at 1920 hours and MRI of the brain December 16, 2014 and MRA head August 23, 2014. FINDINGS: MRI HEAD FINDINGS INTRACRANIAL CONTENTS: 1 cm rounded area of reduced diffusion RIGHT corona radiata. 1 cm reduced diffusion RIGHT frontal periventricular white matter/ body corpus callosum. Both areas demonstrate low ADC values. No susceptibility artifact to suggest hemorrhage. The ventricles and sulci are mildly prominent for patient's age. Patchy to confluent supratentorial and pontine white matter FLAIR T2 hyperintensities. Small area LEFT mesial occipital lobe encephalomalacia. No suspicious parenchymal signal, masses or mass effect. No abnormal extra-axial fluid collections. No extra-axial masses though, contrast enhanced sequences would be more sensitive. Normal major intracranial vascular flow voids present at skull base. ORBITS: The included ocular globes and orbital contents are non-suspicious. Status post bilateral ocular lens implants. SINUSES: The mastoid air-cells and included paranasal sinuses are well-aerated. Known LEFT internal auditory canal schwannoma not conspicuous by noncontrast examination. SKULL/SOFT TISSUES: No abnormal sellar expansion. No suspicious calvarial bone marrow signal. Craniocervical junction maintained. MRA HEAD FINDINGS ANTERIOR CIRCULATION: Normal flow related enhancement of the included cervical, petrous, cavernous and supraclinoid internal carotid arteries. 2 mm laterally directed wide necked aneurysm RIGHT cavernous internal carotid artery is better seen on this high field strength examination. Patent anterior communicating  artery. Normal flow related enhancement of the anterior and middle cerebral arteries, including distal segments. No large vessel occlusion, high-grade stenosis. Moderate luminal regularity of the middle cerebral arteries. POSTERIOR CIRCULATION: Codominant vertebral artery's. Basilar artery is patent, with normal flow related enhancement of the main branch vessels. Normal flow related enhancement of the posterior cerebral arteries. Moderate stenosis proximal RIGHT P3 segment. Moderate stenosis LEFT P3. Robust RIGHT posterior communicating artery. Luminal irregularity of the posterior cerebral arteries. No large vessel occlusion, aneurysm. IMPRESSION: MRI HEAD: Acute 1 cm RIGHT corona radiata and RIGHT frontal periventricular white matter infarcts. Stable moderate global brain atrophy and moderate to severe chronic small vessel ischemic disease. 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 MRA HEAD: No emergent large vessel occlusion. 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 Moderate stenoses of the posterior cerebral arteries and general luminal irregularity of the intracranial vessels compatible with atherosclerosis. Electronically Signed   By: Elon Alas M.D.   On: 11/14/2015 22:56   Mr Brain Wo Contrast  11/14/2015  CLINICAL DATA:  New onset LEFT-sided weakness, possibly beginning yesterday. LEFT facial droop today. History of hypertension, hyperlipidemia, stroke, breast cancer. EXAM: MRI HEAD WITHOUT  CONTRAST MRA HEAD WITHOUT CONTRAST TECHNIQUE: Multiplanar, multiecho pulse sequences of the brain and surrounding structures were obtained without intravenous contrast. Angiographic images of the head were obtained using MRA technique without contrast. COMPARISON:  CT HEAD November 14, 2015 at 1920 hours and MRI of the brain December 16, 2014 and MRA head August 23, 2014. FINDINGS: MRI HEAD FINDINGS INTRACRANIAL CONTENTS: 1 cm rounded area of reduced diffusion RIGHT corona radiata. 1 cm reduced diffusion  RIGHT frontal periventricular white matter/ body corpus callosum. Both areas demonstrate low ADC values. No susceptibility artifact to suggest hemorrhage. The ventricles and sulci are mildly prominent for patient's age. Patchy to confluent supratentorial and pontine white matter FLAIR T2 hyperintensities. Small area LEFT mesial occipital lobe encephalomalacia. No suspicious parenchymal signal, masses or mass effect. No abnormal extra-axial fluid collections. No extra-axial masses though, contrast enhanced sequences would be more sensitive. Normal major intracranial vascular flow voids present at skull base. ORBITS: The included ocular globes and orbital contents are non-suspicious. Status post bilateral ocular lens implants. SINUSES: The mastoid air-cells and included paranasal sinuses are well-aerated. Known LEFT internal auditory canal schwannoma not conspicuous by noncontrast examination. SKULL/SOFT TISSUES: No abnormal sellar expansion. No suspicious calvarial bone marrow signal. Craniocervical junction maintained. MRA HEAD FINDINGS ANTERIOR CIRCULATION: Normal flow related enhancement of the included cervical, petrous, cavernous and supraclinoid internal carotid arteries. 2 mm laterally directed wide necked aneurysm RIGHT cavernous internal carotid artery is better seen on this high field strength examination. Patent anterior communicating artery. Normal flow related enhancement of the anterior and middle cerebral arteries, including distal segments. No large vessel occlusion, high-grade stenosis. Moderate luminal regularity of the middle cerebral arteries. POSTERIOR CIRCULATION: Codominant vertebral artery's. Basilar artery is patent, with normal flow related enhancement of the main branch vessels. Normal flow related enhancement of the posterior cerebral arteries. Moderate stenosis proximal RIGHT P3 segment. Moderate stenosis LEFT P3. Robust RIGHT posterior communicating artery. Luminal irregularity of the  posterior cerebral arteries. No large vessel occlusion, aneurysm. IMPRESSION: MRI HEAD: Acute 1 cm RIGHT corona radiata and RIGHT frontal periventricular white matter infarcts. Stable moderate global brain atrophy and moderate to severe chronic small vessel ischemic disease. 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 MRA HEAD: No emergent large vessel occlusion. 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 Moderate stenoses of the posterior cerebral arteries and general luminal irregularity of the intracranial vessels compatible with atherosclerosis. Electronically Signed   By: Elon Alas M.D.   On: 11/14/2015 22:56    Deke Tilghman, DO  [facility] Pager [phone]  If 7PM-7AM, please contact night-coverage [email or web address] Password [email or web address] 11/15/2015, 7:10 PM

## 2015-11-15 NOTE — Progress Notes (Signed)
Inpatient Rehabilitation  PT is recommending IP Rehab consult.  Pt. Is under observation status but has MRI findings of right corona radiata and right frontal periventricular infarcts.  At this time, we are recommending IP Rehab consult.    [place] Admissions Coordinator Cell [phone] Office [phone]

## 2015-11-15 NOTE — Evaluation (Signed)
Speech Language Pathology Evaluation Patient Details Name: Carmen Duncan MRN: [id number] DOB: July 18, 1940 Today's Date: 11/15/2015 Time: [id number] SLP Time Calculation (min) (ACUTE ONLY): 19 min  Problem List:  Patient Active Problem List   Diagnosis Date Noted  . Acute CVA (cerebrovascular accident) ([place]) 11/15/2015  . CVA (cerebral vascular accident) ([place]) 11/14/2015  . Left-sided muscle weakness 11/14/2015  . Left-sided weakness 11/14/2015  . Breast cancer of upper-outer quadrant of right female breast ([place]) 07/19/2015  . B12 deficiency 03/14/2015  . Bilateral carotid artery disease ([place]) 10/07/2014  . Pulmonary hypertension ([place]) 10/07/2014  . Ejection fraction   . Cryptogenic stroke ([place]) 10/04/2014  . Oral candidiasis 04/28/2014  . Chronic diastolic congestive heart failure ([place]) 04/28/2014  . IPF (idiopathic pulmonary fibrosis) ([place]) 04/21/2014  . Hyponatremia 04/19/2014  . Thrombocytopenia ([place]) 04/19/2014  . Malnutrition of moderate degree ([place]) 04/19/2014  . Hypertension 04/18/2014  . Hyperlipidemia 04/18/2014  . Hypovolemia due to dehydration 04/18/2014  . Dyspnea on exertion 04/04/2014  . Chest rales 04/04/2014  . Abnormal nuclear stress test 04/04/2014  . HOH (hard of hearing)   . Raynaud's phenomenon 03/21/2014  . Tremor, essential 03/12/2013  . Unspecified hereditary and idiopathic peripheral neuropathy 08/04/2012  . Squamous cell skin cancer 08/19/2011  . 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 Dyslipidemia 04/03/2010  . Essential hypertension 04/03/2010  . GERD 04/03/2010  . IBS 04/03/2010  . DEEP VENOUS THROMBOPHLEBITIS, HX OF 04/03/2010  . DIVERTICULITIS, HX OF 04/03/2010   Past Medical History:  Past Medical History  Diagnosis Date  . Arthritis   . Asthma   . Hypertension   . GERD (gastroesophageal reflux disease)   . Hyperlipidemia   . Diverticulosis   . IBS (irritable bowel syndrome)   . HOH (hard of hearing) left ear  . Acoustic neuroma ([place])   .  Ejection fraction   . Breast cancer of upper-outer quadrant of right female breast ([place]) 07/19/2015  . Brain cancer ([place])   . Anxiety   . Stroke [facility])     no deficits, on plavix  . Essential tremor     on inderal  . Pneumonia     04-2014, CAP  . Interstitial lung disease [facility])    Past Surgical History:  Past Surgical History  Procedure Laterality Date  . Cholecystectomy  2011  . Abdominal hysterectomy  1980  . Tonsillectomy  1945  . Biopsy breast    . Eyes  2007    cararacts  . Tee without cardioversion N/A 09/05/2014    Procedure: TRANSESOPHAGEAL ECHOCARDIOGRAM (TEE);  Surgeon: Lelon Perla, MD;  Location: [facility] ENDOSCOPY;  Service: Cardiovascular;  Laterality: N/A;  . Breast lumpectomy with radioactive seed and sentinel lymph node biopsy Right 08/10/2015    Procedure: BREAST LUMPECTOMY WITH RADIOACTIVE SEED AND SENTINEL LYMPH NODE BIOPSY;  Surgeon: Autumn Messing III, MD;  Location: [place];  Service: General;  Laterality: Right;   HPI:  Pt is a 75 y.o. female with PMH of hypertension, hyperlipidemia, breast cancer status post Resection and radiation,, essential tremor, interstitial lung disease, and remote stroke without residual deficits who presents to the ED with left-sided weakness that developed 6/20. MRI showed acute R corona radiata/ R frontal periventricular white matter infarcts, old L PCA territory infarct. Speech reportredly with mild dysarthria. Speech-language eval ordered as part of stroke workup.    Assessment / Plan / Recommendation Clinical Impression  Pt currently presenting with mild cognitive deficits and a very mild dysarthria. The MOCA (cognitive assessment) was  administered and pt received a score of 22/30, demonstrating difficultes with delayed recall of verbal information and executive function tasks, along with limited endurance on occasion for longer tasks, as pt had just woken from a nap and expressed feeling tired during evaluation. Pt's  speech was 100% intelligible in conversation but mildly slurred due to L side facial weakness with likely CN VII involvement. Son at bedside reported that pt's cognitive status did not seem to change immediately following stroke; however, for the past year pt has had difficulties with memory and performing everyday tasks, needing assistance with keeping track of appointments and medications. Pt would benefit from continued speech therapy to address memory/ executive function skills along with motor speech to increase safety and quality of life in acute setting and at next level of care. Will continue to follow.    SLP Assessment  Patient needs continued Speech Lanaguage Pathology Services    Follow Up Recommendations  Inpatient Rehab    Frequency and Duration min 1 x/week  2 weeks      SLP Evaluation Prior Functioning  Cognitive/Linguistic Baseline: Baseline deficits Baseline deficit details: son reports memory deficits over the past year Type of Home: House  Lives With: Son Available Help at Discharge: Family;Available PRN/intermittently   Cognition  Overall Cognitive Status: Impaired/Different from baseline Arousal/Alertness: Awake/alert Orientation Level: Oriented X4 (did not know exact date- knew month/ year) Attention: Selective Selective Attention: Appears intact Memory: Impaired Memory Impairment: Decreased recall of new information;Decreased short term memory Decreased Short Term Memory: Verbal basic Awareness: Appears intact Safety/Judgment: Appears intact    Comprehension  Auditory Comprehension Overall Auditory Comprehension: Appears within functional limits for tasks assessed Yes/No Questions: Within Functional Limits Conversation: Simple Other Conversation Comments: pt fatigued during eval; limited conversation Interfering Components: Other (comment) (fatigue- just woke from nap) Visual Recognition/Discrimination Discrimination: Within Function Limits Reading  Comprehension Reading Status: Within funtional limits    Expression Expression Primary Mode of Expression: Verbal Verbal Expression Overall Verbal Expression: Appears within functional limits for tasks assessed Initiation: No impairment Level of Generative/Spontaneous Verbalization: Conversation Repetition: No impairment Naming: No impairment Pragmatics: No impairment Non-Verbal Means of Communication: Not applicable Written Expression Dominant Hand: Right Written Expression: Within Functional Limits   Oral / Motor  Oral Motor/Sensory Function Overall Oral Motor/Sensory Function: Mild impairment Facial ROM: Reduced left Facial Symmetry: Abnormal symmetry left Facial Strength: Reduced left Facial Sensation: Within Functional Limits Lingual ROM: Within Functional Limits Lingual Symmetry: Within Functional Limits Lingual Strength: Within Functional Limits Motor Speech Overall Motor Speech: Impaired Respiration: Within functional limits Phonation: Normal Resonance: Within functional limits Articulation: Impaired Level of Impairment: Conversation Intelligibility: Intelligible Motor Planning: Witnin functional limits Motor Speech Errors: Not applicable   GO          Functional Assessment Tool Used: clinical judgment Functional Limitations: Memory Memory Current Status [id number]): At least 20 percent but less than 40 percent impaired, limited or restricted Memory Goal Status [id number]): At least 1 percent but less than 20 percent impaired, limited or restricted         Kern Reap, [place], CCC-SLP 11/15/2015, 1:47 PM [id number]

## 2015-11-15 NOTE — H&P (Signed)
Physical Medicine and Rehabilitation Admission H&P    Chief Complaint  Patient presents with  . Stroke Symptoms  : HPI: Carmen Duncan is a 75 y.o. right handed female with history of hypertension, hyperlipidemia, breast cancer status post resection and radiation, essential tremor, remote stroke in the past without residual deficits maintained on Plavix. Patient lives with son and family. 2 level home with bedroom upstairs. Independent prior to admission. Presented 11/14/2015 a left-sided weakness. MRI of the brain showed acute 1 cm right corona radiata and right frontal periventricular white matter infarct. 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 MRA of the head with no emergent large vessel occlusion.  TEE with EF 55-60 % no thrombus and Loop recorder placed 11/16/2015. [name] Patient did not receive TPA. Neurology consulted presently remains on Plavix for CVA prophylaxis. Subcutaneous Lovenox for DVT prophylaxis. Physical and occupational therapy evaluation completed 11/15/2015 with recommendations of physical medicine rehabilitation consult.Patient was admitted for comprehensive rehabilitation program  ROS Constitutional: Negative for fever and chills.  HENT: Negative for hearing loss.  Eyes: Negative for blurred vision and double vision.  Respiratory: Negative for cough and shortness of breath.  Cardiovascular: Negative for chest pain, palpitations and leg swelling.  Gastrointestinal: Positive for diarrhea and constipation. Negative for nausea and vomiting.   GERD  Genitourinary: Negative for dysuria and hematuria.  Musculoskeletal: Positive for myalgias and joint pain.  Skin: Negative for rash.  Neurological: Positive for tremors and weakness. Negative for seizures and headaches.  Psychiatric/Behavioral:   Anxiety  All other systems reviewed and are negative   Past Medical History  Diagnosis Date  . Arthritis   . Asthma   . Hypertension   . GERD (gastroesophageal reflux disease)    . Hyperlipidemia   . Diverticulosis   . IBS (irritable bowel syndrome)   . HOH (hard of hearing) left ear  . Acoustic neuroma ([place])   . Ejection fraction   . Breast cancer of upper-outer quadrant of right female breast ([place]) 07/19/2015  . Brain cancer ([place])   . Anxiety   . Stroke [facility])     no deficits, on plavix  . Essential tremor     on inderal  . Pneumonia     04-2014, CAP  . Interstitial lung disease [facility])    Past Surgical History  Procedure Laterality Date  . Cholecystectomy  2011  . Abdominal hysterectomy  1980  . Tonsillectomy  1945  . Biopsy breast    . Eyes  2007    cararacts  . Tee without cardioversion N/A 09/05/2014    Procedure: TRANSESOPHAGEAL ECHOCARDIOGRAM (TEE);  Surgeon: Lelon Perla, MD;  Location: [facility] ENDOSCOPY;  Service: Cardiovascular;  Laterality: N/A;  . Breast lumpectomy with radioactive seed and sentinel lymph node biopsy Right 08/10/2015    Procedure: BREAST LUMPECTOMY WITH RADIOACTIVE SEED AND SENTINEL LYMPH NODE BIOPSY;  Surgeon: Autumn Messing III, MD;  Location: [place];  Service: General;  Laterality: Right;  . Ep implantable device N/A 11/16/2015    Procedure: Loop Recorder Insertion;  Surgeon: [name], MD;  Location: [place] CV LAB;  Service: Cardiovascular;  Laterality: N/A;  . Tee without cardioversion N/A 11/16/2015    Procedure: TRANSESOPHAGEAL ECHOCARDIOGRAM (TEE);  Surgeon: Larey Dresser, MD;  Location: [facility] ENDOSCOPY;  Service: Cardiovascular;  Laterality: N/A;   Family History  Problem Relation Age of Onset  . Hyperlipidemia Mother   . Hypertension Mother   . Hyperlipidemia Father   . Heart disease Father 5  .  Stroke Father   . Lung cancer Paternal Uncle     x 4   Social History:  reports that she has never smoked. She has never used smokeless tobacco. She reports that she drinks alcohol. She reports that she does not use illicit drugs. Allergies:  Allergies  Allergen Reactions  . Latex Itching, Rash and  Other (See Comments)    Red angry skin from latex tape  . Sulfonamide Derivatives Rash    hives  . Tape Itching and Rash    Please use paper tape or coban wrap!!   Medications Prior to Admission  Medication Sig Dispense Refill  . acetaminophen (TYLENOL) 325 MG tablet Take 650 mg by mouth every 6 (six) hours as needed for headache.    [name] atorvastatin (LIPITOR) 20 MG tablet TAKE ONE TABLET BY MOUTH ONCE DAILY 30 tablet 11  . clidinium-chlordiazePOXIDE (LIBRAX) 5-2.5 MG capsule Take 1 capsule by mouth 2 (two) times daily as needed. 60 capsule 3  . clopidogrel (PLAVIX) 75 MG tablet Take 1 tablet (75 mg total) by mouth daily. (Patient taking differently: Take 75 mg by mouth at bedtime. ) 30 tablet 5  . Nintedanib ([name]) 100 MG CAPS Take 100 mg by mouth 2 (two) times daily.    . pantoprazole (PROTONIX) 40 MG tablet TAKE ONE TABLET BY MOUTH ONCE DAILY 30 tablet 5  . propranolol (INDERAL) 20 MG tablet Take 1 tablet (20 mg total) by mouth daily. 30 tablet 6    Home: Home Living Family/patient expects to be discharged to:: Private residence Living Arrangements: Children Available Help at Discharge: Family, Available PRN/intermittently Type of Home: House Home Access: Stairs to enter Technical brewer of Steps: 4-5 Entrance Stairs-Rails: Right Home Layout: Two level, Bed/bath upstairs Alternate Level Stairs-Number of Steps: 1 flight Alternate Level Stairs-Rails: Right Bathroom Shower/Tub: Multimedia programmer: Handicapped height Home Equipment: Environmental consultant - 2 wheels, Shower seat - built in, [employer] - tub/shower  Lives With: Son   Functional History: Prior Function Level of Independence: Independent Comments: Cooks, cleans, drives. No falls reported.  Functional Status:  Mobility: Bed Mobility General bed mobility comments: Pt OOB in chair upon arrival. Transfers Overall transfer level: Needs assistance Equipment used: None Transfers: Sit to/from Stand Sit to Stand:  Min guard General transfer comment: Min guard for safety. Multiple sit <> stands from chair during ADL. Ambulation/Gait Ambulation/Gait assistance: Min assist Ambulation Distance (Feet): 120 Feet Assistive device: Rolling walker (2 wheeled) Gait Pattern/deviations: Trendelenburg, Drifts right/left, Step-through pattern General Gait Details: facilitation for L hip stabilization in stance Gait velocity: decreased Gait velocity interpretation: Below normal speed for age/gender    ADL: ADL Overall ADL's : Needs assistance/impaired Eating/Feeding: Set up, Sitting Grooming: Standing, Oral care, Brushing hair, Sitting, Applying deodorant, Min guard, Wash/dry face Grooming Details (indicate cue type and reason): Decreased L hand fine motor coordination but attempting to use functionally.  Upper Body Bathing: Set up, Min guard, Sitting Upper Body Bathing Details (indicate cue type and reason): Cues for sitting upright. Pt with L lateral lean during functional activities. Lower Body Bathing: Minimal assistance, Sit to/from stand Lower Body Bathing Details (indicate cue type and reason): Assist for balance in standing. Upper Body Dressing : Set up, Min guard, Sitting Upper Body Dressing Details (indicate cue type and reason): to doff/don hopsital gown Lower Body Dressing: Minimal assistance, Sitting/lateral leans Lower Body Dressing Details (indicate cue type and reason): Assist to pull up L side of underwear. Pt able to doff/don socks and underwear with  increased time. Toilet Transfer: Min guard, Ambulation, Cueing for sequencing, Cueing for safety, Comfort height toilet, RW Toilet Transfer Details (indicate cue type and reason): Pt running into things on her L side with functional mobility; VCs to correct. Toileting- Clothing Manipulation and Hygiene: Minimal assistance, Sit to/from stand Toileting - Clothing Manipulation Details (indicate cue type and reason): Assist to pull up L side of  underwear. Functional mobility during ADLs: Min guard, Rolling walker, Cueing for safety General ADL Comments: Pt continues to demonstrate L UE coordination deficits impacting her independence and safety with ADLs and functional mobility. Required VCs for keeping L hand on RW. Encouraged use of LUE during functional activities; pt able to return demo understanding.  Cognition: Cognition Overall Cognitive Status: Impaired/Different from baseline Arousal/Alertness: Awake/alert Orientation Level: Oriented X4 Attention: Selective Selective Attention: Appears intact Memory: Impaired Memory Impairment: Decreased recall of new information, Decreased short term memory Decreased Short Term Memory: Verbal basic Awareness: Appears intact Safety/Judgment: Appears intact Cognition Arousal/Alertness: Awake/alert Behavior During Therapy: WFL for tasks assessed/performed Overall Cognitive Status: Impaired/Different from baseline Area of Impairment: Memory, Safety/judgement Memory: Decreased short-term memory Safety/Judgement: Decreased awareness of safety, Decreased awareness of deficits  Physical Exam: Blood pressure 135/89, pulse 75, temperature 98 F (36.7 C), temperature source Oral, resp. rate 20, SpO2 97 %. Physical Exam Constitutional: She is oriented to person, place, and time. She appears well-developed.  HENT: oral mucosa pink and moist, dentition good Head: Normocephalic.  Eyes: EOM are normal.  Neck: Normal range of motion. Neck supple. No thyromegaly present.  Cardiovascular: Normal rate and regular rhythm. No murmurs, rubs, or gallops  Respiratory: Effort normal and breath sounds normal. No respiratory distress.  GI: Soft. Bowel sounds are normal. She exhibits no distension.  Neurological: She is alert and oriented to person, place, and time.  Follows full commands. Good awareness of deficits. Functional memory, insight, decision making. Language normal. Skin: Skin is warm and  dry.  5/5 strength in the right deltoid, biceps, triceps, grip, hip flexor, knee extensor, ankle dorsi flexor plantar flexion 4/5 in the left deltoid, biceps, triceps, grip, hip flexor, knee extensor, ankle dorsi flexor plantar flexor. Decreased FMC LUE and LLE without ataxia. No tremor. Minimal pronator drift. Sensation intact. DTR's 1+.  Psych: pleasant and appropriate.    Results for orders placed or performed during the hospital encounter of 11/14/15 (from the past 48 hour(s))  Glucose, capillary     Status: Abnormal   Collection Time: 11/15/15  9:46 PM  Result Value Ref Range   Glucose-Capillary 132 (H) 65 - 99 mg/dL   Comment 1 Notify RN    Comment 2 Document in Chart   Glucose, capillary     Status: Abnormal   Collection Time: 11/16/15  6:16 AM  Result Value Ref Range   Glucose-Capillary 108 (H) 65 - 99 mg/dL  Basic metabolic panel     Status: Abnormal   Collection Time: 11/16/15  8:18 AM  Result Value Ref Range   Sodium 139 135 - 145 mmol/L   Potassium 4.0 3.5 - 5.1 mmol/L   Chloride 106 101 - 111 mmol/L   CO2 25 22 - 32 mmol/L   Glucose, Bld 104 (H) 65 - 99 mg/dL   BUN 10 6 - 20 mg/dL   Creatinine, Ser 0.65 0.44 - 1.00 mg/dL   Calcium 9.3 8.9 - 10.3 mg/dL   GFR calc non Af Amer >60 >60 mL/min   GFR calc Af Amer >60 >60 mL/min    Comment: (NOTE)  The eGFR has been calculated using the CKD EPI equation. This calculation has not been validated in all clinical situations. eGFR's persistently <60 mL/min signify possible Chronic Kidney Disease.    Anion gap 8 5 - 15   No results found.     Medical Problem List and Plan: 1.  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  DVT Prophylaxis/Anticoagulation: Subcutaneous Lovenox. Monitor platelet counts and for any signs of bleeding 3. Pain Management: Tylenol as needed. No pain at this time 4. Hypertension. Permissive hypertension for  perfusion. Monitor BP with increased mobility 5. Neuropsych: This patient is capable of making decisions on her own behalf. 6. Skin/Wound Care: Routine skin checks 7. Fluids/Electrolytes/Nutrition: Routine I&O's with follow-up chemistries 8. Hyperlipidemia. Lipitor 9. Irritable bowel syndrome. Continue Librax 10. Essential tremor. Patient on Inderal 20 mg daily prior to admission. Resume as needed 11. Asthma. Continue [name] 100 mg twice a day  Post Admission Physician Evaluation: 1. Functional deficits secondary  to right corona radiata and lenticulostriate infarct. 2. Patient is admitted to receive collaborative, interdisciplinary care between the physiatrist, rehab nursing staff, and therapy team. 3. Patient's level of medical complexity and substantial therapy needs in context of that medical necessity cannot be provided at a lesser intensity of care such as a SNF. 4. Patient has experienced substantial functional loss from his/her baseline which was documented above under the "Functional History" and "Functional Status" headings.  Judging by the patient's diagnosis, physical exam, and functional history, the patient has potential for functional progress which will result in measurable gains while on inpatient rehab.  These gains will be of substantial and practical use upon discharge  in facilitating mobility and self-care at the household level. 5. Physiatrist will provide 24 hour management of medical needs as well as oversight of the therapy plan/treatment and provide guidance as appropriate regarding the interaction of the two. 6. 24 hour rehab nursing will assist with bladder management, bowel management, safety, skin/wound care, disease management, medication administration, pain management and patient education  and help integrate therapy concepts, techniques,education, etc. 7. PT will assess and treat for/with: Lower extremity strength, range of motion, stamina, balance, functional  mobility, safety, adaptive techniques and equipment, NMR, ego support, community reintegration.   Goals are: mod I. 8. OT will assess and treat for/with: ADL's, functional mobility, safety, upper extremity strength, adaptive techniques and equipment, NMR, education, ego support, community reintegration.   Goals are: mod I. Therapy may proceed with showering this patient. 9. SLP will assess and treat for/with: n/a.  Goals are: n/a. 10. Case Management and Social Worker will assess and treat for psychological issues and discharge planning. 11. Team conference will be held weekly to assess progress toward goals and to determine barriers to discharge. 12. Patient will receive at least 3 hours of therapy per day at least 5 days per week. 13. ELOS: 7-8 days       14. Prognosis:  excellent     Meredith Staggers, MD, [place] [facility]   11/17/2015

## 2015-11-15 NOTE — Progress Notes (Signed)
    [facility] has been requested to perform a transesophageal echocardiogram on Carmen Duncan for stroke. After careful review of history and examination, the risks and benefits of transesophageal echocardiogram have been explained including risks of esophageal damage, perforation (1:10,000 risk), bleeding, pharyngeal hematoma as well as other potential complications associated with conscious sedation including aspiration, arrhythmia, respiratory failure and death. Alternatives to treatment were discussed, questions were answered. Patient is willing to proceed. This is scheduled for 3pm tomorrow with Dr. Aundra Dubin. I spoke with Tommye Standard EP PA-C to make her aware of need for loop consult. She will arrange scheduling of this portion if felt appropriate.  Charlie Pitter, PA-C 11/15/2015 10:56 AM

## 2015-11-15 NOTE — Evaluation (Addendum)
Occupational Therapy Evaluation Patient Details Name: Carmen Duncan MRN: [id number] DOB: [birth year] Today's Date: 11/15/2015    History of Present Illness Patient is a 75 y/o female wtih hx of HTN, asthma, PNA ,breast cancer s/p Resection and radiation, essential tremor, interstitial lung disease, and remote stroke without residual deficits presents with left sided weakness. MRI-Acute 1 cm RIGHT corona radiata and RIGHT frontal periventricular white matter infarcts.   Clinical Impression   Pt reports she was very independent with ADLs and mobility PTA. Currently pt overall min guard for functional mobility and min assist for ADLs. Pt requiring consistent verbal cues for placement of L hand on RW during functional mobility. Pt noted to be running into objects on L side during functional mobility; VCs to correct. Pt presenting with L UE weakness, changes in sensation, and decreased fine and gross motor coordination impacting her independence and safety with ADLs and functional mobility. Recommending CIR level therapies at this time to maximize independence and safety with ADLs and functional mobility prior to return home. Pt would benefit from continued skilled OT to address established goals.    Follow Up Recommendations  CIR;Supervision/Assistance - 24 hour    Equipment Recommendations  Other (comment) (TBD at next venue)    Recommendations for Other Services Rehab consult     Precautions / Restrictions Precautions Precautions: Fall Restrictions Weight Bearing Restrictions: No      Mobility Bed Mobility               General bed mobility comments: Pt OOB in chair upon arrival.  Transfers Overall transfer level: Needs assistance Equipment used: Rolling walker (2 wheeled) Transfers: Sit to/from Stand Sit to Stand: Min guard         General transfer comment: Min guard for safety. VCs for placement of L hand on RW.    Balance Overall balance assessment: Needs  assistance Sitting-balance support: Feet supported;No upper extremity supported Sitting balance-Leahy Scale: Good     Standing balance support: No upper extremity supported;During functional activity Standing balance-Leahy Scale: Fair Standing balance comment: Able to brush teeth and wash face standing at sink with Min guard assist for safety and assist with setup.                            ADL Overall ADL's : Needs assistance/impaired Eating/Feeding: Set up;Sitting   Grooming: Minimal assistance;Standing;Oral care   Upper Body Bathing: Minimal assitance;Sitting   Lower Body Bathing: Minimal assistance;Sit to/from stand   Upper Body Dressing : Minimal assistance;Sitting   Lower Body Dressing: Minimal assistance;Sit to/from stand   Toilet Transfer: Min guard;Ambulation;Cueing for sequencing;Cueing for safety;Comfort height toilet;RW Toilet Transfer Details (indicate cue type and reason): Pt running into things on her L side with functional mobility; VCs to correct. Toileting- Clothing Manipulation and Hygiene: Minimal assistance;Sit to/from stand Toileting - Clothing Manipulation Details (indicate cue type and reason): Assist to pull up L side of underwear.     Functional mobility during ADLs: Min guard;Rolling walker;Cueing for safety;Cueing for sequencing General ADL Comments: Pt required consistent VCs for L hand placement on RW with functional mobility. Pt noted to be running into objects on L side during functional mobility in room; able to correct with VCs. Educated pt on proper positioning of L UE and continued use of LUE during functional activities.     Vision Vision Assessment?: Vision impaired- to be further tested in functional context;Yes Tracking/Visual Pursuits: Decreased smoothness of horizontal tracking;Decreased  smoothness of vertical tracking Visual Fields: No apparent deficits Additional Comments: Pt running into objects on L side with functional  mobility in room.   Perception     Praxis      Pertinent Vitals/Pain Pain Assessment: 0-10 Pain Score: 6  Pain Location: L shoulder when lifting or ROM Pain Descriptors / Indicators: Aching;Sore Pain Intervention(s): Monitored during session     Hand Dominance Right   Extremity/Trunk Assessment Upper Extremity Assessment Upper Extremity Assessment: LUE deficits/detail LUE Deficits / Details: Overall 3/5 strength throughout LUE. Decreased shoulder AROM, full PROM. Pt reports pain with shoulder PROM. Pt report distal sensation changes. LUE: Unable to fully assess due to pain LUE Sensation: decreased light touch LUE Coordination: decreased fine motor;decreased gross motor   Lower Extremity Assessment Lower Extremity Assessment: Defer to PT evaluation     Communication Communication Communication: No difficulties   Cognition Arousal/Alertness: Awake/alert Behavior During Therapy: WFL for tasks assessed/performed Overall Cognitive Status: Impaired/Different from baseline Area of Impairment: Memory;Safety/judgement     Memory: Decreased short-term memory   Safety/Judgement: Decreased awareness of safety;Decreased awareness of deficits         General Comments       Exercises       Shoulder Instructions      Home Living Family/patient expects to be discharged to:: Private residence Living Arrangements: Children Available Help at Discharge: Family;Available PRN/intermittently Type of Home: House Home Access: Stairs to enter [employer] of Steps: 4-5 Entrance Stairs-Rails: Right Home Layout: Two level;Bed/bath upstairs Alternate Level Stairs-Number of Steps: 1 flight Alternate Level Stairs-Rails: Right Bathroom Shower/Tub: Hospital doctor Toilet: Handicapped height     Home Equipment: Environmental consultant - 2 wheels;Shower seat - built in;Grab bars - tub/shower          Prior Functioning/Environment Level of Independence: Independent         Comments: Cooks, cleans, drives. No falls reported.    OT Diagnosis: Generalized weakness;Cognitive deficits;Disturbance of vision;Acute pain;Hemiplegia non-dominant side   OT Problem List: Decreased strength;Decreased range of motion;Impaired balance (sitting and/or standing);Impaired vision/perception;Decreased coordination;Decreased cognition;Decreased safety awareness;Decreased knowledge of use of DME or AE;Decreased knowledge of precautions;Impaired sensation;Impaired UE functional use;Pain   OT Treatment/Interventions: Self-care/ADL training;Therapeutic exercise;Neuromuscular education;Energy conservation;DME and/or AE instruction;Therapeutic activities;Cognitive remediation/compensation;Visual/perceptual remediation/compensation;Patient/family education;Balance training    OT Goals(Current goals can be found in the care plan section) Acute Rehab OT Goals Patient Stated Goal: to go home and return to independence OT Goal Formulation: With patient Time For Goal Achievement: 11/29/15 Potential to Achieve Goals: Good ADL Goals Pt Will Perform Grooming: with supervision;standing Pt Will Perform Upper Body Bathing: with supervision;sitting Pt Will Perform Lower Body Bathing: with supervision;sit to/from stand Pt Will Transfer to Toilet: with supervision;ambulating;regular height toilet Pt Will Perform Toileting - Clothing Manipulation and hygiene: with supervision;sit to/from stand Pt/caregiver will Perform Home Exercise Program: Increased ROM;Increased strength;Left upper extremity;With theraband;With theraputty;Independently;With written HEP provided (increased fine motor coordination)  OT Frequency: Min 2X/week   Barriers to D/C:            Co-evaluation              End of Session Equipment Utilized During Treatment: Gait belt;Rolling walker  Activity Tolerance: Patient tolerated treatment well Patient left: in chair;with call bell/phone within reach;with chair alarm  set;with family/visitor present   Time: [id number] OT Time Calculation (min): 25 min Charges:  OT General Charges $OT Visit: 1 Procedure OT Evaluation $OT Eval Moderate Complexity: 1 Procedure OT Treatments $Self Care/Home Management :  8-22 mins G-Codes: OT G-codes **NOT FOR INPATIENT CLASS** Functional Assessment Tool Used: Clinical judgement Functional Limitation: Self care Self Care Current Status [id number]): At least 20 percent but less than 40 percent impaired, limited or restricted Self Care Goal Status [id number]): At least 1 percent but less than 20 percent impaired, limited or restricted   Binnie Kand M.S., OTR/L Pager: [phone]  11/15/2015, 10:40 AM

## 2015-11-15 NOTE — Progress Notes (Signed)
VASCULAR LAB PRELIMINARY  PRELIMINARY  PRELIMINARY  PRELIMINARY  Carotid duplex has been completed.    Bilateral:  1-39% ICA stenosis.  Vertebral artery flow is antegrade.     Janifer Adie, RVT, RDMS 11/15/2015, 4:35 PM

## 2015-11-15 NOTE — Progress Notes (Addendum)
STROKE TEAM PROGRESS NOTE   HISTORY OF PRESENT ILLNESS (per record) Carmen Duncan is an 75 y.o. female with hx of hypertension, hyperlipidemia, stroke, pulmonary hypertension and breast cancer presenting with new onset left-sided weakness. Time of onset is unclear (LKW 11/13/2015). She was noted to be weak yesterday and had some apparent difficulty going upstairs. She fell and injured her left arm earlier this morning. Family members noted left facial droop earlier today. She's been on Plavix 75 mg per day. CT scan of the head showed no acute intracranial abnormality. NIH stroke score was 4. Patient was not administered IV t-PA secondary to Unclear when patient was last known well. She was admitted for further evaluation and treatment.   SUBJECTIVE (INTERVAL HISTORY) Her son is at the bedside, who is a Software engineer within [facility] [employer] system. He attends appts with her and is knowledgeable about her health. She is sitting up in the gerichair, awake and talkative. Overall she feels her condition is unchanged. Still has left UE weakness, left facial droop. Pending TEE and loop.    OBJECTIVE Temp:  [97.7 F (36.5 C)-98.6 F (37 C)] 97.7 F (36.5 C) (06/21 0655) Pulse Rate:  [76-89] 76 (06/21 0400) Cardiac Rhythm:  [-] Normal sinus rhythm (06/21 0700) Resp:  [16-23] 20 (06/21 0655) BP: (137-190)/(57-88) 172/57 mmHg (06/21 0655) SpO2:  [94 %-100 %] 98 % (06/21 0655)  CBC:  Recent Labs Lab 11/14/15 1854 11/14/15 1913  WBC 5.0  --   NEUTROABS 3.3  --   HGB 15.0 15.3*  HCT 45.9 45.0  MCV 90.0  --   PLT 198  --     Basic Metabolic Panel:  Recent Labs Lab 11/14/15 1854 11/14/15 1913  NA 137 139  K 4.1 4.1  CL 107 108  CO2 22  --   GLUCOSE 94 90  BUN 15 17  CREATININE 0.73 0.60  CALCIUM 9.4  --     Lipid Panel:    Component Value Date/Time   CHOL 132 11/15/2015 0516   TRIG 166* 11/15/2015 0516   HDL 35* 11/15/2015 0516   CHOLHDL 3.8 11/15/2015 0516   VLDL 33 11/15/2015 0516    LDLCALC 64 11/15/2015 0516   HgbA1c:  Lab Results  Component Value Date   HGBA1C 5.9* 04/18/2014   Urine Drug Screen: No results found for: LABOPIA, COCAINSCRNUR, LABBENZ, AMPHETMU, THCU, LABBARB    IMAGING  Dg Chest 2 View 11/14/2015   1. Coarse probably chronic bibasilar interstitial opacities. No definite acute or superimposed abnormality. 2.  Aortic Atherosclerosis (ICD10-170.0)   Ct Head Wo Contrast 11/14/2015   1. No acute intracranial pathology seen on CT. 2. Mild cortical volume loss and scattered small vessel ischemic microangiopathy.   MRI HEAD 11/14/2015 Acute 1 cm RIGHT corona radiata and RIGHT frontal periventricular white matter infarcts. Stable moderate global brain atrophy and moderate to severe chronic small vessel ischemic disease. 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   MRA HEAD 11/14/2015 No emergent large vessel occlusion. 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 Moderate stenoses of the posterior cerebral arteries and general luminal irregularity of the intracranial vessels compatible with atherosclerosis.  2D Echocardiogram - pending  Carotid Doppler  - Bilateral: 1-39% ICA stenosis. Vertebral artery flow is antegrade.  LE venous doppler - pending  TEE - pending   PHYSICAL EXAM Physical exam  Temp:  [97.2 F (36.2 C)-98.6 F (37 C)] 98.2 F (36.8 C) (06/21 2119) Pulse Rate:  [76-95] 95 (06/21 2119) Resp:  [16-20]  20 (06/21 2119) BP: (137-181)/(57-88) 170/63 mmHg (06/21 2119) SpO2:  [94 %-100 %] 96 % (06/21 2119)  General - Well nourished, well developed, in no apparent distress.  Ophthalmologic - Fundi not visualized due to noncooperation.  Cardiovascular - Regular rate and rhythm.  Mental Status -  Level of arousal and orientation to month, age, place, and person were intact, but not orientated to year. Language including expression, naming, repetition, comprehension was assessed and found intact. Fund of Knowledge was assessed  and was impaired  Cranial Nerves II - XII - II - Visual field intact OU. III, IV, VI - Extraocular movements intact. V - Facial sensation intact bilaterally. VII - left facial droop. VIII - Hearing & vestibular intact bilaterally. X - Palate elevates symmetrically. XI - Chin turning & shoulder shrug intact bilaterally. XII - Tongue protrusion intact.  Motor Strength - The patient's strength was normal in RUE and RLE, but 3+/5 LUE and 4/5 LLE proximal but 5-/5 distal and pronator drift was present on the left.  Bulk was normal and fasciculations were absent.   Motor Tone - Muscle tone was assessed at the neck and appendages and was normal.  Reflexes - The patient's reflexes were 1+ in all extremities and she had no pathological reflexes.  Sensory - Light touch, temperature/pinprick were assessed and were symmetrical.    Coordination - The patient had normal movements in the hands with no ataxia or dysmetria.  Tremor was absent.  Gait and Station - not tested due to safety concerns.   ASSESSMENT/PLAN Carmen Duncan is a 75 y.o. female with history of  hypertension, hyperlipidemia, stroke, pulmonary hypertension and breast cancer presenting with new onset left-sided weakness. She did not receive IV t-PA due to unknown LKW.   Stroke:  R corona radiata and R frontal PV white matter infarcts in setting of old L PCA infarct. Infarcts felt to be embolic secondary to unknown source  MRI  R corona radiata (long insular artery territory) and R frontal PV white matter (right ACA territory) infarcts   MRA  No large vessel occlusion. Stable small R ICA aneurysm. Intracranial atherosclerosis  Carotid Doppler  unremarkable   2D Echo  pending   LE venous doppler pending TEE to look for embolic source. Arranged with [place] for tomorrow.  If positive for PFO (patent foramen ovale), check bilateral lower extremity venous dopplers to rule out DVT as possible source of  stroke. (I have made patient NPO after midnight tonight). If TEE negative, a [place] electrophysiologist will consult and consider placement of an implantable loop recorder to evaluate for atrial fibrillation as etiology of stroke. This has been explained to patient/family by Dr. Erlinda Hong and they are agreeable.   LDL 64  HgbA1c pending  Lovenox 40 mg sq daily for VTE prophylaxis  Diet Heart Room service appropriate?: Yes; Fluid consistency:: Thin  Diet NPO time specified  Diet NPO time specified Except for: Sips with Meds  clopidogrel 75 mg daily prior to admission, now on clopidogrel 75 mg daily.   Patient counseled to be compliant with her antithrombotic medications  Ongoing aggressive stroke risk factor management  Therapy recommendations:  CIR. Consult in place  Disposition:  pending   Palpitation   Pt complains of intermittent heart palpitation  May happen once a month  Lasting 5-71min and resolved               Pt had 21 day monitoring in  09/2014 but not compliant - only several hours recording valid  Will do TEE and loop recorder if TEE negative  Hypertension  Stable  Permissive hypertension (OK if < 220/120) but gradually normalize in 5-7 days  Long-term BP goal normotensive  Hyperlipidemia  Home meds:  lipitor 20, resumed in hospital  LDL 64, goal < 70  Continue statin at discharge  Other Stroke Risk Factors  Advanced age  ETOH use, advised to drink no more than 1 drinks a day  Hx stroke/TIA  Family hx stroke (father)  Other Active Problems  Idiopathic pulmonary fibrosis  GERD, IBS  Essential tremor - follows with Dr. Wells Guiles [facility] at [facility]  R breast cancer, s/p lumpectomy and radiation 07/2015  Left small acoustic neuroma s/p radiation  Hospital day # 1  Rosalin Hawking, MD PhD Stroke Neurology 11/15/2015 10:21 PM     To contact Stroke Continuity provider, please refer to [email or web address] After hours,  contact General Neurology

## 2015-11-16 ENCOUNTER — Inpatient Hospital Stay (HOSPITAL_COMMUNITY): Payer: Medicare Other

## 2015-11-16 ENCOUNTER — Encounter (HOSPITAL_COMMUNITY): Payer: Self-pay

## 2015-11-16 ENCOUNTER — Other Ambulatory Visit (HOSPITAL_COMMUNITY): Payer: Medicare Other

## 2015-11-16 ENCOUNTER — Encounter (HOSPITAL_COMMUNITY)
Admission: EM | Disposition: A | Discharge status: Rehabilitation Facility | Payer: Self-pay | Source: Home / Self Care | Attending: Internal Medicine

## 2015-11-16 DIAGNOSIS — I639 Cerebral infarction, unspecified: Secondary | ICD-10-CM

## 2015-11-16 DIAGNOSIS — G8194 Hemiplegia, unspecified affecting left nondominant side: Secondary | ICD-10-CM | POA: Insufficient documentation

## 2015-11-16 HISTORY — PX: TEE WITHOUT CARDIOVERSION: SHX5443

## 2015-11-16 HISTORY — PX: EP IMPLANTABLE DEVICE: SHX172B

## 2015-11-16 LAB — HEMOGLOBIN A1C
HEMOGLOBIN A1C: 5.4 % (ref 4.8–5.6)
Mean Plasma Glucose: 108 mg/dL

## 2015-11-16 LAB — VAS US CAROTID
LCCADDIAS: -11 cm/s
LCCADSYS: -58 cm/s
LCCAPSYS: 146 cm/s
LEFT ECA DIAS: -2 cm/s
LEFT VERTEBRAL DIAS: 15 cm/s
LICADSYS: -87 cm/s
Left CCA prox dias: 18 cm/s
Left ICA dist dias: -16 cm/s
Left ICA prox dias: 12 cm/s
Left ICA prox sys: 62 cm/s
RCCAPDIAS: 14 cm/s
RCCAPSYS: 75 cm/s
RIGHT ECA DIAS: -2 cm/s
RIGHT VERTEBRAL DIAS: 9 cm/s
Right cca dist sys: -87 cm/s

## 2015-11-16 LAB — BASIC METABOLIC PANEL
ANION GAP: 8 (ref 5–15)
BUN: 10 mg/dL (ref 6–20)
CALCIUM: 9.3 mg/dL (ref 8.9–10.3)
CO2: 25 mmol/L (ref 22–32)
Chloride: 106 mmol/L (ref 101–111)
Creatinine, Ser: 0.65 mg/dL (ref 0.44–1.00)
GFR calc Af Amer: >60 mL/min (ref >60)
GLUCOSE: 104 mg/dL — AB (ref 65–99)
Potassium: 4 mmol/L (ref 3.5–5.1)
Sodium: 139 mmol/L (ref 135–145)

## 2015-11-16 LAB — GLUCOSE, CAPILLARY: Glucose-Capillary: 108 mg/dL — ABNORMAL HIGH (ref 65–99)

## 2015-11-16 SURGERY — LOOP RECORDER INSERTION

## 2015-11-16 SURGERY — ECHOCARDIOGRAM, TRANSESOPHAGEAL
Anesthesia: Moderate Sedation

## 2015-11-16 MED ORDER — LIDOCAINE-EPINEPHRINE 1 %-1:100000 IJ SOLN
INTRAMUSCULAR | Status: DC | PRN
  Administered 2015-11-16: 10 mL via INTRADERMAL

## 2015-11-16 MED ORDER — LIDOCAINE-EPINEPHRINE 1 %-1:100000 IJ SOLN
INTRAMUSCULAR | Status: AC
  Filled 2015-11-16: qty 1

## 2015-11-16 MED ORDER — SODIUM CHLORIDE 0.9 % IV SOLN: INTRAVENOUS | Status: DC

## 2015-11-16 MED ORDER — MIDAZOLAM HCL 5 MG/ML IJ SOLN
INTRAMUSCULAR | Status: AC
  Filled 2015-11-16: qty 2

## 2015-11-16 MED ORDER — FENTANYL CITRATE (PF) 100 MCG/2ML IJ SOLN
INTRAMUSCULAR | Status: DC | PRN
  Administered 2015-11-16: 25 ug via INTRAVENOUS

## 2015-11-16 MED ORDER — MIDAZOLAM HCL 10 MG/2ML IJ SOLN
INTRAMUSCULAR | Status: DC | PRN
  Administered 2015-11-16: 2 mg via INTRAVENOUS

## 2015-11-16 MED ORDER — BUTAMBEN-TETRACAINE-BENZOCAINE 2-2-14 % EX AERO
INHALATION_SPRAY | CUTANEOUS | Status: DC | PRN
  Administered 2015-11-16: 2 via TOPICAL

## 2015-11-16 MED ORDER — FENTANYL CITRATE (PF) 100 MCG/2ML IJ SOLN
INTRAMUSCULAR | Status: AC
  Filled 2015-11-16: qty 2

## 2015-11-16 SURGICAL SUPPLY — 2 items
LOOP REVEAL LINQSYS (Prosthesis & Implant Heart) ×2 IMPLANT
PACK LOOP INSERTION (CUSTOM PROCEDURE TRAY) ×2 IMPLANT

## 2015-11-16 NOTE — H&P (View-Only) (Signed)
ELECTROPHYSIOLOGY CONSULT NOTE  Patient ID: Carmen Duncan MRN: [id number], DOB/AGE: 77-10-1940   Admit date: 11/14/2015 Date of Consult: 11/16/2015  Primary Physician: [name], MD Primary Cardiologist: new to [facility] Reason for Consultation: Cryptogenic stroke; recommendations regarding Implantable Loop Recorder  History of Present Illness Carmen Duncan was admitted on 11/14/2015 with left sided weakness and facial droop.  Imaging demonstrated right corona radiata and R frontal PV white matter infarcts in setting of old L PCA infarct felt to be embolic 2/2 unknown source.  She was found to have embolic stroke at time of ophthalmologic exam in 08/2014 and underwent TEE at that time that showed EF 55-60%, no RWMA, mild AI, MR, and TR.  Carotid dopplers 06/2014 showed no significant carotid stenosis.  The patient has been monitored on telemetry which has demonstrated sinus rhythm with no arrhythmias.  Lab work is reviewed.  Prior to admission, the patient denies chest pain, shortness of breath, dizziness, palpitations, or syncope.  They are recovering from their stroke with plans to participate in CIR at discharge.  EP has been asked to evaluate for placement of an implantable loop recorder to monitor for atrial fibrillation.   Past Medical History  Diagnosis Date  . Arthritis   . Asthma   . Hypertension   . GERD (gastroesophageal reflux disease)   . Hyperlipidemia   . Diverticulosis   . IBS (irritable bowel syndrome)   . HOH (hard of hearing) left ear  . Acoustic neuroma ([place])   . Ejection fraction   . Breast cancer of upper-outer quadrant of right female breast ([place]) 07/19/2015  . Brain cancer ([place])   . Anxiety   . Stroke [facility])     no deficits, on plavix  . Essential tremor     on inderal  . Pneumonia     04-2014, CAP  . Interstitial lung disease [facility])      Surgical History:  Past Surgical History  Procedure Laterality Date  . Cholecystectomy  2011  . Abdominal  hysterectomy  1980  . Tonsillectomy  1945  . Biopsy breast    . Eyes  2007    cararacts  . Tee without cardioversion N/A 09/05/2014    Procedure: TRANSESOPHAGEAL ECHOCARDIOGRAM (TEE);  Surgeon: Lelon Perla, MD;  Location: [facility] ENDOSCOPY;  Service: Cardiovascular;  Laterality: N/A;  . Breast lumpectomy with radioactive seed and sentinel lymph node biopsy Right 08/10/2015    Procedure: BREAST LUMPECTOMY WITH RADIOACTIVE SEED AND SENTINEL LYMPH NODE BIOPSY;  Surgeon: Autumn Messing III, MD;  Location: [facility];  Service: General;  Laterality: Right;     Prescriptions prior to admission  Medication Sig Dispense Refill Last Dose  . acetaminophen (TYLENOL) 325 MG tablet Take 650 mg by mouth every 6 (six) hours as needed for headache.   11/14/2015 at Unknown time  . atorvastatin (LIPITOR) 20 MG tablet TAKE ONE TABLET BY MOUTH ONCE DAILY 30 tablet 11 11/13/2015 at pm  . clidinium-chlordiazePOXIDE (LIBRAX) 5-2.5 MG capsule Take 1 capsule by mouth 2 (two) times daily as needed. 60 capsule 3 Past Week at pm  . clopidogrel (PLAVIX) 75 MG tablet Take 1 tablet (75 mg total) by mouth daily. (Patient taking differently: Take 75 mg by mouth at bedtime. ) 30 tablet 5 11/13/2015 at pm  . Nintedanib ([name]) 100 MG CAPS Take 100 mg by mouth 2 (two) times daily.   11/14/2015 at Unknown time  . pantoprazole (PROTONIX) 40 MG tablet TAKE ONE TABLET BY MOUTH  ONCE DAILY 30 tablet 5 11/13/2015 at Unknown time  . propranolol (INDERAL) 20 MG tablet Take 1 tablet (20 mg total) by mouth daily. 30 tablet 6 11/13/2015 at Unknown time    Inpatient Medications:  . atorvastatin  20 mg Oral Daily  . clopidogrel  75 mg Oral Daily  . enoxaparin (LOVENOX) injection  40 mg Subcutaneous Q24H  . Nintedanib  100 mg Oral BID  . pantoprazole  40 mg Oral Daily  . propranolol  20 mg Oral Daily    Allergies:  Allergies  Allergen Reactions  . Latex Itching, Rash and Other (See Comments)    Red angry skin from latex tape  .  Sulfonamide Derivatives Rash    hives  . Tape Itching and Rash    Please use paper tape or coban wrap!!    Social History   Social History  . Marital Status: Widowed    Spouse Name: N/A  . Number of Children: N/A  . Years of Education: N/A   Occupational History  . Not on file.   Social History Main Topics  . Smoking status: Never Smoker   . Smokeless tobacco: Never Used  . Alcohol Use: 0.0 oz/week    0 Standard drinks or equivalent per week     Comment: wine about 3 times a week  . Drug Use: No  . Sexual Activity:    Partners: Female   Other Topics Concern  . Not on file   Social History Narrative     Family History  Problem Relation Age of Onset  . Hyperlipidemia Mother   . Hypertension Mother   . Hyperlipidemia Father   . Heart disease Father 44  . Stroke Father   . Lung cancer Paternal Uncle     x 4      Review of Systems: All other systems reviewed and are otherwise negative except as noted above.  Physical Exam: Filed Vitals:   11/15/15 1751 11/15/15 2119 11/16/15 0148 11/16/15 0540  BP: 166/77 170/63 143/77 162/78  Pulse: 86 95 85 83  Temp: 97.5 F (36.4 C) 98.2 F (36.8 C) 98.4 F (36.9 C) 98.3 F (36.8 C)  TempSrc: Oral Oral Oral Oral  Resp: 19 20 18 19   SpO2: 100% 96% 99% 99%    GEN- The patient is elderly and thin appearing, alert and oriented x 3 today.   Head- normocephalic, atraumatic Eyes-  Sclera clear, conjunctiva pink Ears- hearing intact Oropharynx- clear Neck- supple Lungs- Clear to ausculation bilaterally, normal work of breathing Heart- Regular rate and rhythm, no murmurs, rubs or gallops  GI- soft, NT, ND, + BS Extremities- no clubbing, cyanosis, or edema MS- no significant deformity or atrophy Skin- no rash or lesion Psych- euthymic mood, full affect   Labs:   Lab Results  Component Value Date   WBC 5.0 11/14/2015   HGB 15.3* 11/14/2015   HCT 45.0 11/14/2015   MCV 90.0 11/14/2015   PLT 198 11/14/2015      Recent Labs Lab 11/14/15 1854 11/14/15 1913  NA 137 139  K 4.1 4.1  CL 107 108  CO2 22  --   BUN 15 17  CREATININE 0.73 0.60  CALCIUM 9.4  --   PROT 7.4  --   BILITOT 0.5  --   ALKPHOS 75  --   ALT 20  --   AST 31  --   GLUCOSE 94 90     Radiology/Studies: Dg Chest 2 View 11/14/2015  CLINICAL DATA:  CEREBRUM , stroke EXAM: CHEST - 2 VIEW COMPARISON:  CT 09/04/2015 and previous FINDINGS: Coarse linear scarring or subsegmental atelectasis in the lung bases, improved since previous radiograph of 06/08/2014. No confluent airspace disease or overt edema. Heart size upper limits normal. Atheromatous aorta, mildly tortuous. No effusion.  No pneumothorax.  Surgical clips right axilla. Anterior vertebral endplate spurring at multiple levels in the mid thoracic spine. IMPRESSION: 1. Coarse probably chronic bibasilar interstitial opacities. No definite acute or superimposed abnormality. 2.  Aortic Atherosclerosis (ICD10-170.0) Electronically Signed   By: Lucrezia Europe M.D.   On: 11/14/2015 22:42   Ct Head Wo Contrast 11/14/2015  CLINICAL DATA:  Acute onset of stroke-like symptoms. Initial encounter. EXAM: CT HEAD WITHOUT CONTRAST TECHNIQUE: Contiguous axial images were obtained from the base of the skull through the vertex without intravenous contrast. COMPARISON:  MRI of the brain performed 12/16/2014 FINDINGS: There is no evidence of acute infarction, mass lesion, or intra- or extra-axial hemorrhage on CT. Prominence of the ventricles and sulci reflects mild cortical volume loss. Scattered periventricular and subcortical white matter change likely reflects small vessel ischemic microangiopathy. The brainstem and fourth ventricle are within normal limits. The basal ganglia are unremarkable in appearance. The cerebral hemispheres demonstrate grossly normal gray-white differentiation. No mass effect or midline shift is seen. There is no evidence of fracture; visualized osseous structures are unremarkable in  appearance. The orbits are within normal limits. The paranasal sinuses and mastoid air cells are well-aerated. No significant soft tissue abnormalities are seen. IMPRESSION: 1. No acute intracranial pathology seen on CT. 2. Mild cortical volume loss and scattered small vessel ischemic microangiopathy. Electronically Signed   By: Garald Balding M.D.   On: 11/14/2015 19:46   Mr Angiogram Head Wo Contrast 11/14/2015  CLINICAL DATA:  New onset LEFT-sided weakness, possibly beginning yesterday. LEFT facial droop today. History of hypertension, hyperlipidemia, stroke, breast cancer. EXAM: MRI HEAD WITHOUT CONTRAST MRA HEAD WITHOUT CONTRAST TECHNIQUE: Multiplanar, multiecho pulse sequences of the brain and surrounding structures were obtained without intravenous contrast. Angiographic images of the head were obtained using MRA technique without contrast. COMPARISON:  CT HEAD November 14, 2015 at 1920 hours and MRI of the brain December 16, 2014 and MRA head August 23, 2014. FINDINGS: MRI HEAD FINDINGS INTRACRANIAL CONTENTS: 1 cm rounded area of reduced diffusion RIGHT corona radiata. 1 cm reduced diffusion RIGHT frontal periventricular white matter/ body corpus callosum. Both areas demonstrate low ADC values. No susceptibility artifact to suggest hemorrhage. The ventricles and sulci are mildly prominent for patient's age. Patchy to confluent supratentorial and pontine white matter FLAIR T2 hyperintensities. Small area LEFT mesial occipital lobe encephalomalacia. No suspicious parenchymal signal, masses or mass effect. No abnormal extra-axial fluid collections. No extra-axial masses though, contrast enhanced sequences would be more sensitive. Normal major intracranial vascular flow voids present at skull base. ORBITS: The included ocular globes and orbital contents are non-suspicious. Status post bilateral ocular lens implants. SINUSES: The mastoid air-cells and included paranasal sinuses are well-aerated. Known LEFT internal  auditory canal schwannoma not conspicuous by noncontrast examination. SKULL/SOFT TISSUES: No abnormal sellar expansion. No suspicious calvarial bone marrow signal. Craniocervical junction maintained. MRA HEAD FINDINGS ANTERIOR CIRCULATION: Normal flow related enhancement of the included cervical, petrous, cavernous and supraclinoid internal carotid arteries. 2 mm laterally directed wide necked aneurysm RIGHT cavernous internal carotid artery is better seen on this high field strength examination. Patent anterior communicating artery. Normal flow related enhancement of the anterior and middle cerebral arteries, including distal segments. No  large vessel occlusion, high-grade stenosis. Moderate luminal regularity of the middle cerebral arteries. POSTERIOR CIRCULATION: Codominant vertebral artery's. Basilar artery is patent, with normal flow related enhancement of the main branch vessels. Normal flow related enhancement of the posterior cerebral arteries. Moderate stenosis proximal RIGHT P3 segment. Moderate stenosis LEFT P3. Robust RIGHT posterior communicating artery. Luminal irregularity of the posterior cerebral arteries. No large vessel occlusion, aneurysm. IMPRESSION: MRI HEAD: Acute 1 cm RIGHT corona radiata and RIGHT frontal periventricular white matter infarcts. Stable moderate global brain atrophy and moderate to severe chronic small vessel ischemic disease. 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 MRA HEAD: No emergent large vessel occlusion. 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 Moderate stenoses of the posterior cerebral arteries and general luminal irregularity of the intracranial vessels compatible with atherosclerosis. Electronically Signed   By: Elon Alas M.D.   On: 11/14/2015 22:56   12-lead ECG sinus rhythm, rate 78 All prior EKG's in EPIC reviewed with no documented atrial fibrillation  Telemetry sinus rhythm  Assessment and Plan:  1. Cryptogenic stroke The patient  presents with cryptogenic stroke.  The patient has a TEE planned for later today. I have sent neurology a message asking if we need to repeat since she had one 08/2014.  I spoke at length with the patient about monitoring for afib with an implantable loop recorder.  Risks, benefits, and alteratives to implantable loop recorder were discussed with the patient today.   At this time, the patient is very clear in their decision to proceed with implantable loop recorder.   Wound care was reviewed with the patient (keep incision clean and dry for 3 days).  Wound check scheduled for 11/27/15 at 4:30PM   Please call with questions.   Chanetta Marshall, NP 11/16/2015 7:38 AM  I have seen, examined the patient, and reviewed the above assessment and plan.  On exam, RRR.  Changes to above are made where necessary.  Neurology to determine if TEE is required (has previously had one 4/16).  IMplantable loop recorder to follow pending their decision.  Risk of ILR implant discussed with patient who wishes to proceed.  Co Sign: [name], MD 11/16/2015 8:02 AM

## 2015-11-16 NOTE — Progress Notes (Signed)
  Echocardiogram Echocardiogram Transesophageal has been performed.  Carmen Duncan 11/16/2015, 11:15 AM

## 2015-11-16 NOTE — Progress Notes (Signed)
Inpatient Rehabilitation  Attempted to meet with patient to discuss team's recommendation for IP Rehab; however, patient not in room at this time.  Plan to follow up later today.  Please call with questions.   Carmelia Roller., CCC/SLP Admission Coordinator  [place] [facility]  Cell [phone]

## 2015-11-16 NOTE — CV Procedure (Signed)
Procedure: TEE  Indication: CVA  Sedation: Versed 2 mg IV, Fentanyl 25 mcg IV  Findings: Please see echo section for full report.  Normal LV size with mild LVH.  EF 55-60%.  Normal RV size and systolic function.  Trivial MR, trivial AI, no AS.  Trileaflet aortic valve.  Trivial TR with peak RV-RA gradient 34 mmHg.  Negative bubble study, no evidence for ASD or PFO.  Normal atrial sizes.  No LA appendage thrombus.  Normal caliber aorta with grade III plaque in arch and descending thoracic aorta.   No source of embolus.  Carmen Duncan 11/16/2015 10:53 AM

## 2015-11-16 NOTE — Progress Notes (Signed)
Inpatient Rehabilitation  Met with patient to discuss team's recommendation for IP Rehab post acute care stay.  Plan to initiate insurance authorization and follow along for timing of medical readiness, insurance authorization, and bed availability.  Please call with questions.  Carmelia Roller., CCC/SLP Admission Coordinator  [facility]  Cell [phone]

## 2015-11-16 NOTE — Procedures (Deleted)
Electrical Cardioversion Procedure Note Carmen Duncan ID:[id number] August 15, 1940  Procedure: Electrical Cardioversion Indications:  Atrial Fibrillation  Procedure Details Consent: Risks of procedure as well as the alternatives and risks of each were explained to the (patient/caregiver).  Consent for procedure obtained. Time Out: Verified patient identification, verified procedure, site/side was marked, verified correct patient position, special equipment/implants available, medications/allergies/relevent history reviewed, required imaging and test results available.  Performed  Patient placed on cardiac monitor, pulse oximetry, supplemental oxygen as necessary.  Sedation given: Propofol per anesthesiology Pacer pads placed anterior and posterior chest.  Cardioverted 1 time(s).  Cardioverted at [place].  Evaluation Findings: Post procedure EKG shows: NSR Complications: None Patient did tolerate procedure well.   Carmen Duncan 11/16/2015, 11:32 AM

## 2015-11-16 NOTE — Consult Note (Signed)
ELECTROPHYSIOLOGY CONSULT NOTE  Patient ID: Carmen Duncan MRN: [id number], DOB/AGE: [birth year]   Admit date: 11/14/2015 Date of Consult: 11/16/2015  Primary Physician: [name], MD Primary Cardiologist: new to [facility] Reason for Consultation: Cryptogenic stroke; recommendations regarding Implantable Loop Recorder  History of Present Illness Carmen Duncan was admitted on 11/14/2015 with left sided weakness and facial droop.  Imaging demonstrated right corona radiata and R frontal PV white matter infarcts in setting of old L PCA infarct felt to be embolic 2/2 unknown source.  She was found to have embolic stroke at time of ophthalmologic exam in 08/2014 and underwent TEE at that time that showed EF 55-60%, no RWMA, mild AI, MR, and TR.  Carotid dopplers 06/2014 showed no significant carotid stenosis.  The patient has been monitored on telemetry which has demonstrated sinus rhythm with no arrhythmias.  Lab work is reviewed.  Prior to admission, the patient denies chest pain, shortness of breath, dizziness, palpitations, or syncope.  They are recovering from their stroke with plans to participate in CIR at discharge.  EP has been asked to evaluate for placement of an implantable loop recorder to monitor for atrial fibrillation.   Past Medical History  Diagnosis Date  . Arthritis   . Asthma   . Hypertension   . GERD (gastroesophageal reflux disease)   . Hyperlipidemia   . Diverticulosis   . IBS (irritable bowel syndrome)   . HOH (hard of hearing) left ear  . Acoustic neuroma ([place])   . Ejection fraction   . Breast cancer of upper-outer quadrant of right female breast ([place]) 07/19/2015  . Brain cancer ([place])   . Anxiety   . Stroke [facility])     no deficits, on plavix  . Essential tremor     on inderal  . Pneumonia     04-2014, CAP  . Interstitial lung disease [facility])      Surgical History:  Past Surgical History  Procedure Laterality Date  . Cholecystectomy  2011  . Abdominal  hysterectomy  1980  . Tonsillectomy  1945  . Biopsy breast    . Eyes  2007    cararacts  . Tee without cardioversion N/A 09/05/2014    Procedure: TRANSESOPHAGEAL ECHOCARDIOGRAM (TEE);  Surgeon: Lelon Perla, MD;  Location: [facility] ENDOSCOPY;  Service: Cardiovascular;  Laterality: N/A;  . Breast lumpectomy with radioactive seed and sentinel lymph node biopsy Right 08/10/2015    Procedure: BREAST LUMPECTOMY WITH RADIOACTIVE SEED AND SENTINEL LYMPH NODE BIOPSY;  Surgeon: Autumn Messing III, MD;  Location: [facility];  Service: General;  Laterality: Right;     Prescriptions prior to admission  Medication Sig Dispense Refill Last Dose  . acetaminophen (TYLENOL) 325 MG tablet Take 650 mg by mouth every 6 (six) hours as needed for headache.   11/14/2015 at Unknown time  . atorvastatin (LIPITOR) 20 MG tablet TAKE ONE TABLET BY MOUTH ONCE DAILY 30 tablet 11 11/13/2015 at pm  . clidinium-chlordiazePOXIDE (LIBRAX) 5-2.5 MG capsule Take 1 capsule by mouth 2 (two) times daily as needed. 60 capsule 3 Past Week at pm  . clopidogrel (PLAVIX) 75 MG tablet Take 1 tablet (75 mg total) by mouth daily. (Patient taking differently: Take 75 mg by mouth at bedtime. ) 30 tablet 5 11/13/2015 at pm  . Nintedanib ([name]) 100 MG CAPS Take 100 mg by mouth 2 (two) times daily.   11/14/2015 at Unknown time  . pantoprazole (PROTONIX) 40 MG tablet TAKE ONE TABLET BY MOUTH  ONCE DAILY 30 tablet 5 11/13/2015 at Unknown time  . propranolol (INDERAL) 20 MG tablet Take 1 tablet (20 mg total) by mouth daily. 30 tablet 6 11/13/2015 at Unknown time    Inpatient Medications:  . atorvastatin  20 mg Oral Daily  . clopidogrel  75 mg Oral Daily  . enoxaparin (LOVENOX) injection  40 mg Subcutaneous Q24H  . Nintedanib  100 mg Oral BID  . pantoprazole  40 mg Oral Daily  . propranolol  20 mg Oral Daily    Allergies:  Allergies  Allergen Reactions  . Latex Itching, Rash and Other (See Comments)    Red angry skin from latex tape  .  Sulfonamide Derivatives Rash    hives  . Tape Itching and Rash    Please use paper tape or coban wrap!!    Social History   Social History  . Marital Status: Widowed    Spouse Name: N/A  . Number of Children: N/A  . Years of Education: N/A   Occupational History  . Not on file.   Social History Main Topics  . Smoking status: Never Smoker   . Smokeless tobacco: Never Used  . Alcohol Use: 0.0 oz/week    0 Standard drinks or equivalent per week     Comment: wine about 3 times a week  . Drug Use: No  . Sexual Activity:    Partners: Female   Other Topics Concern  . Not on file   Social History Narrative     Family History  Problem Relation Age of Onset  . Hyperlipidemia Mother   . Hypertension Mother   . Hyperlipidemia Father   . Heart disease Father 31  . Stroke Father   . Lung cancer Paternal Uncle     x 4      Review of Systems: All other systems reviewed and are otherwise negative except as noted above.  Physical Exam: Filed Vitals:   11/15/15 1751 11/15/15 2119 11/16/15 0148 11/16/15 0540  BP: 166/77 170/63 143/77 162/78  Pulse: 86 95 85 83  Temp: 97.5 F (36.4 C) 98.2 F (36.8 C) 98.4 F (36.9 C) 98.3 F (36.8 C)  TempSrc: Oral Oral Oral Oral  Resp: 19 20 18 19   SpO2: 100% 96% 99% 99%    GEN- The patient is elderly and thin appearing, alert and oriented x 3 today.   Head- normocephalic, atraumatic Eyes-  Sclera clear, conjunctiva pink Ears- hearing intact Oropharynx- clear Neck- supple Lungs- Clear to ausculation bilaterally, normal work of breathing Heart- Regular rate and rhythm, no murmurs, rubs or gallops  GI- soft, NT, ND, + BS Extremities- no clubbing, cyanosis, or edema MS- no significant deformity or atrophy Skin- no rash or lesion Psych- euthymic mood, full affect   Labs:   Lab Results  Component Value Date   WBC 5.0 11/14/2015   HGB 15.3* 11/14/2015   HCT 45.0 11/14/2015   MCV 90.0 11/14/2015   PLT 198 11/14/2015      Recent Labs Lab 11/14/15 1854 11/14/15 1913  NA 137 139  K 4.1 4.1  CL 107 108  CO2 22  --   BUN 15 17  CREATININE 0.73 0.60  CALCIUM 9.4  --   PROT 7.4  --   BILITOT 0.5  --   ALKPHOS 75  --   ALT 20  --   AST 31  --   GLUCOSE 94 90     Radiology/Studies: Dg Chest 2 View 11/14/2015  CLINICAL DATA:  CEREBRUM , stroke EXAM: CHEST - 2 VIEW COMPARISON:  CT 09/04/2015 and previous FINDINGS: Coarse linear scarring or subsegmental atelectasis in the lung bases, improved since previous radiograph of 06/08/2014. No confluent airspace disease or overt edema. Heart size upper limits normal. Atheromatous aorta, mildly tortuous. No effusion.  No pneumothorax.  Surgical clips right axilla. Anterior vertebral endplate spurring at multiple levels in the mid thoracic spine. IMPRESSION: 1. Coarse probably chronic bibasilar interstitial opacities. No definite acute or superimposed abnormality. 2.  Aortic Atherosclerosis (ICD10-170.0) Electronically Signed   By: Lucrezia Europe M.D.   On: 11/14/2015 22:42   Ct Head Wo Contrast 11/14/2015  CLINICAL DATA:  Acute onset of stroke-like symptoms. Initial encounter. EXAM: CT HEAD WITHOUT CONTRAST TECHNIQUE: Contiguous axial images were obtained from the base of the skull through the vertex without intravenous contrast. COMPARISON:  MRI of the brain performed 12/16/2014 FINDINGS: There is no evidence of acute infarction, mass lesion, or intra- or extra-axial hemorrhage on CT. Prominence of the ventricles and sulci reflects mild cortical volume loss. Scattered periventricular and subcortical white matter change likely reflects small vessel ischemic microangiopathy. The brainstem and fourth ventricle are within normal limits. The basal ganglia are unremarkable in appearance. The cerebral hemispheres demonstrate grossly normal gray-white differentiation. No mass effect or midline shift is seen. There is no evidence of fracture; visualized osseous structures are unremarkable in  appearance. The orbits are within normal limits. The paranasal sinuses and mastoid air cells are well-aerated. No significant soft tissue abnormalities are seen. IMPRESSION: 1. No acute intracranial pathology seen on CT. 2. Mild cortical volume loss and scattered small vessel ischemic microangiopathy. Electronically Signed   By: Garald Balding M.D.   On: 11/14/2015 19:46   Mr Angiogram Head Wo Contrast 11/14/2015  CLINICAL DATA:  New onset LEFT-sided weakness, possibly beginning yesterday. LEFT facial droop today. History of hypertension, hyperlipidemia, stroke, breast cancer. EXAM: MRI HEAD WITHOUT CONTRAST MRA HEAD WITHOUT CONTRAST TECHNIQUE: Multiplanar, multiecho pulse sequences of the brain and surrounding structures were obtained without intravenous contrast. Angiographic images of the head were obtained using MRA technique without contrast. COMPARISON:  CT HEAD November 14, 2015 at 1920 hours and MRI of the brain December 16, 2014 and MRA head August 23, 2014. FINDINGS: MRI HEAD FINDINGS INTRACRANIAL CONTENTS: 1 cm rounded area of reduced diffusion RIGHT corona radiata. 1 cm reduced diffusion RIGHT frontal periventricular white matter/ body corpus callosum. Both areas demonstrate low ADC values. No susceptibility artifact to suggest hemorrhage. The ventricles and sulci are mildly prominent for patient's age. Patchy to confluent supratentorial and pontine white matter FLAIR T2 hyperintensities. Small area LEFT mesial occipital lobe encephalomalacia. No suspicious parenchymal signal, masses or mass effect. No abnormal extra-axial fluid collections. No extra-axial masses though, contrast enhanced sequences would be more sensitive. Normal major intracranial vascular flow voids present at skull base. ORBITS: The included ocular globes and orbital contents are non-suspicious. Status post bilateral ocular lens implants. SINUSES: The mastoid air-cells and included paranasal sinuses are well-aerated. Known LEFT internal  auditory canal schwannoma not conspicuous by noncontrast examination. SKULL/SOFT TISSUES: No abnormal sellar expansion. No suspicious calvarial bone marrow signal. Craniocervical junction maintained. MRA HEAD FINDINGS ANTERIOR CIRCULATION: Normal flow related enhancement of the included cervical, petrous, cavernous and supraclinoid internal carotid arteries. 2 mm laterally directed wide necked aneurysm RIGHT cavernous internal carotid artery is better seen on this high field strength examination. Patent anterior communicating artery. Normal flow related enhancement of the anterior and middle cerebral arteries, including distal segments. No  large vessel occlusion, high-grade stenosis. Moderate luminal regularity of the middle cerebral arteries. POSTERIOR CIRCULATION: Codominant vertebral artery's. Basilar artery is patent, with normal flow related enhancement of the main branch vessels. Normal flow related enhancement of the posterior cerebral arteries. Moderate stenosis proximal RIGHT P3 segment. Moderate stenosis LEFT P3. Robust RIGHT posterior communicating artery. Luminal irregularity of the posterior cerebral arteries. No large vessel occlusion, aneurysm. IMPRESSION: MRI HEAD: Acute 1 cm RIGHT corona radiata and RIGHT frontal periventricular white matter infarcts. Stable moderate global brain atrophy and moderate to severe chronic small vessel ischemic disease. 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 MRA HEAD: No emergent large vessel occlusion. 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 Moderate stenoses of the posterior cerebral arteries and general luminal irregularity of the intracranial vessels compatible with atherosclerosis. Electronically Signed   By: Elon Alas M.D.   On: 11/14/2015 22:56   12-lead ECG sinus rhythm, rate 78 All prior EKG's in EPIC reviewed with no documented atrial fibrillation  Telemetry sinus rhythm  Assessment and Plan:  1. Cryptogenic stroke The patient  presents with cryptogenic stroke.  The patient has a TEE planned for later today. I have sent neurology a message asking if we need to repeat since she had one 08/2014.  I spoke at length with the patient about monitoring for afib with an implantable loop recorder.  Risks, benefits, and alteratives to implantable loop recorder were discussed with the patient today.   At this time, the patient is very clear in their decision to proceed with implantable loop recorder.   Wound care was reviewed with the patient (keep incision clean and dry for 3 days).  Wound check scheduled for 11/27/15 at 4:30PM   Please call with questions.   Chanetta Marshall, NP 11/16/2015 7:38 AM  I have seen, examined the patient, and reviewed the above assessment and plan.  On exam, RRR.  Changes to above are made where necessary.  Neurology to determine if TEE is required (has previously had one 4/16).  IMplantable loop recorder to follow pending their decision.  Risk of ILR implant discussed with patient who wishes to proceed.  Co Sign: [name], MD 11/16/2015 8:02 AM

## 2015-11-16 NOTE — Progress Notes (Addendum)
Speech Language Pathology Treatment: Cognitive-Linquistic  Patient Details Name: Carmen Duncan MRN: [id number] DOB: 1940-11-14 Today's Date: 11/16/2015 Time: [id number] SLP Time Calculation (min) (ACUTE ONLY): 13 min  Assessment / Plan / Recommendation Clinical Impression  Cognitive-linguistic treatment provided today. Pt more alert today compared to eval on previous date. Treatment focused on short-term memory/ compensatory strategies. Pt expressed difficulties remembering dates if not writing them down (does use calendar regularly) and with remembering names. Reviewed compensatory strategies (word associations, taking notes- e.g., writing down new names, repeating new information out loud). Pt able to recall 1 out of 3 of these strategies after review. Pt recalled working with OT and PT but was unable to recall their recommendations. Pt expressed concern with speech; however, during treatment session speech was 100% intelligible and only very slightly slurred. Did briefly review intelligibility strategies (open mouth wider, over-articulate, speak louder). Will continue to follow for cognition.   HPI HPI: Pt is a 75 y.o. female with PMH of hypertension, hyperlipidemia, breast cancer status post Resection and radiation,, essential tremor, interstitial lung disease, and remote stroke without residual deficits who presents to the ED with left-sided weakness that developed 6/20. MRI showed acute R corona radiata/ R frontal periventricular white matter infarcts, old L PCA territory infarct. Speech reportredly with mild dysarthria. Speech-language eval ordered as part of stroke workup.       SLP Plan  Continue with current plan of care     Recommendations                Follow up Recommendations: Inpatient Rehab Plan: Continue with current plan of care     [place], Amy K, [place], CCC-SLP 11/16/2015, 2:01 PM [phone]

## 2015-11-16 NOTE — Progress Notes (Signed)
STROKE TEAM PROGRESS NOTE   HISTORY OF PRESENT ILLNESS (per record) Carmen Duncan is an 75 y.o. female with hx of hypertension, hyperlipidemia, stroke, pulmonary hypertension and breast cancer presenting with new onset left-sided weakness. Time of onset is unclear (LKW 11/13/2015). She was noted to be weak yesterday and had some apparent difficulty going upstairs. She fell and injured her left arm earlier this morning. Family members noted left facial droop earlier today. She's been on Plavix 75 mg per day. CT scan of the head showed no acute intracranial abnormality. NIH stroke score was 4. Patient was not administered IV t-PA secondary to Unclear when patient was last known well. She was admitted for further evaluation and treatment.   SUBJECTIVE (INTERVAL HISTORY) Pt had TEE done and negative for SOE. Loop recorder placed. No PFO on TEE, LE venous doppler cancelled.    OBJECTIVE Temp:  [98 F (36.7 C)-98.4 F (36.9 C)] 98 F (36.7 C) (06/22 1823) Pulse Rate:  [73-95] 82 (06/22 1823) Cardiac Rhythm:  [-]  Resp:  [15-25] 18 (06/22 1823) BP: (143-230)/(63-99) 159/69 mmHg (06/22 1823) SpO2:  [95 %-100 %] 98 % (06/22 1823)  CBC:   Recent Labs Lab 11/14/15 1854 11/14/15 1913  WBC 5.0  --   NEUTROABS 3.3  --   HGB 15.0 15.3*  HCT 45.9 45.0  MCV 90.0  --   PLT 198  --     Basic Metabolic Panel:   Recent Labs Lab 11/14/15 1854 11/14/15 1913 11/16/15 0818  NA 137 139 139  K 4.1 4.1 4.0  CL 107 108 106  CO2 22  --  25  GLUCOSE 94 90 104*  BUN 15 17 10   CREATININE 0.73 0.60 0.65  CALCIUM 9.4  --  9.3    Lipid Panel:     Component Value Date/Time   CHOL 132 11/15/2015 0516   TRIG 166* 11/15/2015 0516   HDL 35* 11/15/2015 0516   CHOLHDL 3.8 11/15/2015 0516   VLDL 33 11/15/2015 0516   LDLCALC 64 11/15/2015 0516   HgbA1c:  Lab Results  Component Value Date   HGBA1C 5.4 11/15/2015   Urine Drug Screen: No results found for: LABOPIA, COCAINSCRNUR, LABBENZ, AMPHETMU,  THCU, LABBARB    IMAGING  Dg Chest 2 View 11/14/2015   1. Coarse probably chronic bibasilar interstitial opacities. No definite acute or superimposed abnormality. 2.  Aortic Atherosclerosis (ICD10-170.0)   Ct Head Wo Contrast 11/14/2015   1. No acute intracranial pathology seen on CT. 2. Mild cortical volume loss and scattered small vessel ischemic microangiopathy.   MRI HEAD 11/14/2015 Acute 1 cm RIGHT corona radiata and RIGHT frontal periventricular white matter infarcts. Stable moderate global brain atrophy and moderate to severe chronic small vessel ischemic disease. 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   MRA HEAD 11/14/2015 No emergent large vessel occlusion. 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 Moderate stenoses of the posterior cerebral arteries and general luminal irregularity of the intracranial vessels compatible with atherosclerosis.  Carotid Doppler  - Bilateral: 1-39% ICA stenosis. Vertebral artery flow is antegrade.  TEE - Normal LV size with mild LVH. EF 55-60%. Normal RV size and systolic function. Trivial MR, trivial AI, no AS. Trileaflet aortic valve. Trivial TR with peak RV-RA gradient 34 mmHg. Negative bubble study, no evidence for ASD or PFO. Normal atrial sizes. No LA appendage thrombus. Normal caliber aorta with grade III plaque in arch and descending thoracic aorta.    PHYSICAL EXAM Physical exam  Temp:  [  98 F (36.7 C)-98.4 F (36.9 C)] 98 F (36.7 C) (06/22 1823) Pulse Rate:  [73-95] 82 (06/22 1823) Resp:  [15-25] 18 (06/22 1823) BP: (143-230)/(63-99) 159/69 mmHg (06/22 1823) SpO2:  [95 %-100 %] 98 % (06/22 1823)  General - Well nourished, well developed, in no apparent distress.  Ophthalmologic - Fundi not visualized due to noncooperation.  Cardiovascular - Regular rate and rhythm.  Mental Status -  Level of arousal and orientation to month, age, place, and person were intact, but not orientated to year. Language including  expression, naming, repetition, comprehension was assessed and found intact. Fund of Knowledge was assessed and was impaired  Cranial Nerves II - XII - II - Visual field intact OU. III, IV, VI - Extraocular movements intact. V - Facial sensation intact bilaterally. VII - left facial droop. VIII - Hearing & vestibular intact bilaterally. X - Palate elevates symmetrically. XI - Chin turning & shoulder shrug intact bilaterally. XII - Tongue protrusion intact.  Motor Strength - The patient's strength was normal in RUE and RLE, but 3+/5 LUE and 4/5 LLE proximal but 5-/5 distal and pronator drift was present on the left.  Bulk was normal and fasciculations were absent.   Motor Tone - Muscle tone was assessed at the neck and appendages and was normal.  Reflexes - The patient's reflexes were 1+ in all extremities and she had no pathological reflexes.  Sensory - Light touch, temperature/pinprick were assessed and were symmetrical.    Coordination - The patient had normal movements in the hands with no ataxia or dysmetria.  Tremor was absent.  Gait and Station - not tested due to safety concerns.   ASSESSMENT/PLAN Ms. Carmen Duncan is a 75 y.o. female with history of  hypertension, hyperlipidemia, stroke, pulmonary hypertension and breast cancer presenting with new onset left-sided weakness. She did not receive IV t-PA due to unknown LKW.   Stroke:  R corona radiata and R frontal PV white matter infarcts in setting of old L PCA infarct. Infarcts felt to be embolic secondary to unknown source  MRI  R corona radiata (long insular artery territory) and R frontal PV white matter (right ACA territory) infarcts   MRA  No large vessel occlusion. Stable small R ICA aneurysm. Intracranial atherosclerosis  Carotid Doppler  unremarkable   TEE EF 55-60% no SOE, no PFO  Loop placed.  LDL 64  HgbA1c 5.4  Lovenox 40 mg sq daily for VTE prophylaxis Diet Heart Room service appropriate?: Yes; Fluid  consistency:: Thin  clopidogrel 75 mg daily prior to admission, now on clopidogrel 75 mg daily. Continue plavix on discharge.  Patient counseled to be compliant with her antithrombotic medications  Ongoing aggressive stroke risk factor management  Therapy recommendations:  CIR. Consult in place  Disposition:  pending   Palpitation   Pt complains of intermittent heart palpitation  May happen once a month  Lasting 5-73min and resolved               Pt had 21 day monitoring in 09/2014 but not compliant - only several hours recording valid  Loop recorder placed  Hypertension  Stable Permissive hypertension (OK if < 220/120) but gradually normalize in 5-7 days Long-term BP goal normotensive  Hyperlipidemia  Home meds:  lipitor 20, resumed in hospital  LDL 64, goal < 70  Continue statin at discharge  Other Stroke Risk Factors  Advanced age  ETOH use, advised to drink no more than 1 drinks a day  Hx  stroke/TIA  Family hx stroke (father)  Other Active Problems  Idiopathic pulmonary fibrosis  GERD, IBS  Essential tremor - follows with Dr. Wells Guiles Tat at [facility]  R breast cancer, s/p lumpectomy and radiation 07/2015  Left small acoustic neuroma s/p radiation  Hospital day # 11  Neurology will sign off. Please call with questions. Pt will follow up with Dr. Carles Collet at [facility] neurology in about 1-2 months. Thanks for the consult.   Rosalin Hawking, MD PhD Stroke Neurology 11/16/2015 7:36 PM     To contact Stroke Continuity provider, please refer to [email or web address] After hours, contact General Neurology

## 2015-11-16 NOTE — Progress Notes (Signed)
PROGRESS NOTE  Carmen Duncan [id number] DOB: [birth year] DOA: 11/14/2015 PCP: [name], MD Brief History:  75 y.o. female with hx of hypertension, hyperlipidemia, stroke, pulmonary hypertension and breast cancer presenting with new onset left-sided weakness. Time of onset is unclear (LKW 11/13/2015). She was noted to be weak yesterday and had some apparent difficulty going upstairs. She fell and injured her left arm earlier this morning. Family members noted left facial droop earlier on 6/20. She's been on Plavix 75 mg per day. CT scan of the head showed no acute intracranial abnormality. Neurology was consulted, and full stroke workup was taken.  Assessment/Plan: 1. 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 Stable small R ICA aneurysm. Intracranial atherosclerosis -Carotid Duplex--no hemodynamically significant stenosis -11/16/15-TEE--Normal LV size with mild LVH. EF 55-60%; Negative bubble study, no evidence for ASD or PFO; No source of embolus. -LDL--64 -HbA1C--5.4  2. Hypertension  - restarted propranolol on 6/22 - Plan to permit HTN for now in setting of suspected acute ischemic CVA  - Treat for SBP >220, or DBP >120  -am bmp  3. Idiopathic pulmonary fibrosis  - Followed by pulmonology in outpt setting  - Recently started on [name], will continue  -stable on RA  4. Hyperlipidemia  - Continue current management with Lipitor 20 mg qHS - LDL 64  5. GERD, IBS - Stable - Continue current management with Protonix and prn Librax   6. Essential tremor  - Stable  - Managed with propranolol   7. Breast cancer, right - Right breast, upper-outer quadrant, DCIS  - Underwent lumpectomy in March 2017 with sampling of 2  sentinel nodes; margins were clean and both LN's clear  - Completed adjuvant RT  - Under surveilence with oncology    Disposition Plan: CIR 6/23 if insurance approves  Family Communication: Son updated at bedside 6/22   Consultants: Neurology, cardiology  Code Status: FULL  DVT Prophylaxis: [place] Lovenox   Procedures: As Listed in Progress Note Above  Antibiotics: None    Subjective: Patient denies fevers, chills, headache, chest pain, dyspnea, nausea, vomiting, diarrhea, abdominal pain, dysuria, hematuria   Objective: Filed Vitals:   11/16/15 1045 11/16/15 1050 11/16/15 1055 11/16/15 1229  BP: 205/91 230/93 203/76 173/75  Pulse: 77 75 73 76  Temp:    98.1 F (36.7 C)  TempSrc:    Oral  Resp: 24 17 21 16   SpO2: 100% 100% 100% 95%   No intake or output data in the 24 hours ending 11/16/15 1807 Weight change:  Exam:   General:  Pt is alert, follows commands appropriately, not in acute distress  HEENT: No icterus, No thrush, No neck mass, [place]/AT  Cardiovascular: RRR, S1/S2, no rubs, no gallops  Respiratory: CTA bilaterally, no wheezing, no crackles, no rhonchi  Abdomen: Soft/+BS, non tender, non distended, no guarding  Extremities: No edema, No lymphangitis, No petechiae, No rashes, no synovitis   Data Reviewed: I have personally reviewed following labs and imaging studies Basic Metabolic Panel:  Recent Labs Lab 11/14/15 1854 11/14/15 1913 11/16/15 0818  NA 137 139 139  K 4.1 4.1 4.0  CL 107 108 106  CO2 22  --  25  GLUCOSE 94 90 104*  BUN 15 17 10   CREATININE 0.73 0.60 0.65  CALCIUM 9.4  --  9.3   Liver Function Tests:  Recent Labs Lab 11/14/15 1854  AST 31  ALT 20  ALKPHOS 75  BILITOT 0.5  PROT 7.4  ALBUMIN 3.6   No results for input(s): LIPASE, AMYLASE in the last 168 hours. No results for input(s): AMMONIA in the last 168 hours. Coagulation Profile:  Recent Labs Lab 11/14/15 1854  INR 0.94   CBC:  Recent  Labs Lab 11/14/15 1854 11/14/15 1913  WBC 5.0  --   NEUTROABS 3.3  --   HGB 15.0 15.3*  HCT 45.9 45.0  MCV 90.0  --   PLT 198  --    Cardiac Enzymes: No results for input(s): CKTOTAL, CKMB, CKMBINDEX, TROPONINI in the last 168 hours. BNP: Invalid input(s): POCBNP CBG:  Recent Labs Lab 11/15/15 0725 11/15/15 1144 11/15/15 1624 11/15/15 2146 11/16/15 0616  GLUCAP 90 80 104* 132* 108*   HbA1C:  Recent Labs  11/15/15 0516  HGBA1C 5.4   Urine analysis:    Component Value Date/Time   COLORURINE YELLOW 11/14/2015 2135   APPEARANCEUR CLEAR 11/14/2015 2135   LABSPEC 1.016 11/14/2015 2135   PHURINE 5.5 11/14/2015 2135   GLUCOSEU NEGATIVE 11/14/2015 2135   HGBUR NEGATIVE 11/14/2015 2135   BILIRUBINUR NEGATIVE 11/14/2015 2135   BILIRUBINUR neg 10/02/2010 1108   KETONESUR NEGATIVE 11/14/2015 2135   PROTEINUR NEGATIVE 11/14/2015 2135   PROTEINUR neg 10/02/2010 1108   UROBILINOGEN 0.2 04/18/2014 2205   UROBILINOGEN 0.2 10/02/2010 1108   NITRITE NEGATIVE 11/14/2015 2135   NITRITE neg 10/02/2010 1108   LEUKOCYTESUR TRACE* 11/14/2015 2135   Sepsis Labs: @LABRCNTIP (procalcitonin:4,lacticidven:4) )No results found for this or any previous visit (from the past 240 hour(s)).   Scheduled Meds: . atorvastatin  20 mg Oral Daily  . clopidogrel  75 mg Oral Daily  . enoxaparin (LOVENOX) injection  40 mg Subcutaneous Q24H  . Nintedanib  100 mg Oral BID  . pantoprazole  40 mg Oral Daily  . propranolol  20 mg Oral Daily   Continuous Infusions:   Procedures/Studies: Dg Chest 2 View  11/14/2015  CLINICAL DATA:  CEREBRUM , stroke EXAM: CHEST - 2 VIEW COMPARISON:  CT 09/04/2015 and previous FINDINGS: Coarse linear scarring or subsegmental atelectasis in the lung bases, improved since previous radiograph of 06/08/2014. No confluent airspace disease or overt edema. Heart size upper limits normal. Atheromatous aorta, mildly tortuous. No effusion.  No pneumothorax.  Surgical clips right  axilla. Anterior vertebral endplate spurring at multiple levels in the mid thoracic spine. IMPRESSION: 1. Coarse probably chronic bibasilar interstitial opacities. No definite acute or superimposed abnormality. 2.  Aortic Atherosclerosis (ICD10-170.0) Electronically Signed   By: Lucrezia Europe M.D.   On: 11/14/2015 22:42   Ct Head Wo Contrast  11/14/2015  CLINICAL DATA:  Acute onset of stroke-like symptoms. Initial encounter. EXAM: CT HEAD WITHOUT CONTRAST TECHNIQUE: Contiguous axial images were obtained from the base of the skull through the vertex without intravenous contrast. COMPARISON:  MRI of the brain performed 12/16/2014 FINDINGS: There is no evidence of acute infarction, mass lesion, or intra- or extra-axial hemorrhage on CT. Prominence of the ventricles and sulci reflects mild cortical volume loss. Scattered periventricular and subcortical white matter change likely reflects small vessel ischemic microangiopathy. The brainstem and fourth ventricle are within normal limits. The basal ganglia are unremarkable in appearance. The cerebral hemispheres demonstrate grossly normal gray-white differentiation. No mass effect or midline shift is seen. There is no evidence of fracture; visualized osseous structures are unremarkable  in appearance. The orbits are within normal limits. The paranasal sinuses and mastoid air cells are well-aerated. No significant soft tissue abnormalities are seen. IMPRESSION: 1. No acute intracranial pathology seen on CT. 2. Mild cortical volume loss and scattered small vessel ischemic microangiopathy. Electronically Signed   By: Garald Balding M.D.   On: 11/14/2015 19:46   Mr Angiogram Head Wo Contrast  11/14/2015  CLINICAL DATA:  New onset LEFT-sided weakness, possibly beginning yesterday. LEFT facial droop today. History of hypertension, hyperlipidemia, stroke, breast cancer. EXAM: MRI HEAD WITHOUT CONTRAST MRA HEAD WITHOUT CONTRAST TECHNIQUE: Multiplanar, multiecho pulse sequences  of the brain and surrounding structures were obtained without intravenous contrast. Angiographic images of the head were obtained using MRA technique without contrast. COMPARISON:  CT HEAD November 14, 2015 at 1920 hours and MRI of the brain December 16, 2014 and MRA head August 23, 2014. FINDINGS: MRI HEAD FINDINGS INTRACRANIAL CONTENTS: 1 cm rounded area of reduced diffusion RIGHT corona radiata. 1 cm reduced diffusion RIGHT frontal periventricular white matter/ body corpus callosum. Both areas demonstrate low ADC values. No susceptibility artifact to suggest hemorrhage. The ventricles and sulci are mildly prominent for patient's age. Patchy to confluent supratentorial and pontine white matter FLAIR T2 hyperintensities. Small area LEFT mesial occipital lobe encephalomalacia. No suspicious parenchymal signal, masses or mass effect. No abnormal extra-axial fluid collections. No extra-axial masses though, contrast enhanced sequences would be more sensitive. Normal major intracranial vascular flow voids present at skull base. ORBITS: The included ocular globes and orbital contents are non-suspicious. Status post bilateral ocular lens implants. SINUSES: The mastoid air-cells and included paranasal sinuses are well-aerated. Known LEFT internal auditory canal schwannoma not conspicuous by noncontrast examination. SKULL/SOFT TISSUES: No abnormal sellar expansion. No suspicious calvarial bone marrow signal. Craniocervical junction maintained. MRA HEAD FINDINGS ANTERIOR CIRCULATION: Normal flow related enhancement of the included cervical, petrous, cavernous and supraclinoid internal carotid arteries. 2 mm laterally directed wide necked aneurysm RIGHT cavernous internal carotid artery is better seen on this high field strength examination. Patent anterior communicating artery. Normal flow related enhancement of the anterior and middle cerebral arteries, including distal segments. No large vessel occlusion, high-grade stenosis.  Moderate luminal regularity of the middle cerebral arteries. POSTERIOR CIRCULATION: Codominant vertebral artery's. Basilar artery is patent, with normal flow related enhancement of the main branch vessels. Normal flow related enhancement of the posterior cerebral arteries. Moderate stenosis proximal RIGHT P3 segment. Moderate stenosis LEFT P3. Robust RIGHT posterior communicating artery. Luminal irregularity of the posterior cerebral arteries. No large vessel occlusion, aneurysm. IMPRESSION: MRI HEAD: Acute 1 cm RIGHT corona radiata and RIGHT frontal periventricular white matter infarcts. Stable moderate global brain atrophy and moderate to severe chronic small vessel ischemic disease. 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 MRA HEAD: No emergent large vessel occlusion. 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 Moderate stenoses of the posterior cerebral arteries and general luminal irregularity of the intracranial vessels compatible with atherosclerosis. Electronically Signed   By: Elon Alas M.D.   On: 11/14/2015 22:56   Mr Brain Wo Contrast  11/14/2015  CLINICAL DATA:  New onset LEFT-sided weakness, possibly beginning yesterday. LEFT facial droop today. History of hypertension, hyperlipidemia, stroke, breast cancer. EXAM: MRI HEAD WITHOUT CONTRAST MRA HEAD WITHOUT CONTRAST TECHNIQUE: Multiplanar, multiecho pulse sequences of the brain and surrounding structures were obtained without intravenous contrast. Angiographic images of the head were obtained using MRA technique without contrast. COMPARISON:  CT HEAD November 14, 2015 at 1920 hours and MRI of the brain December 16, 2014 and MRA head August 23, 2014. FINDINGS: MRI HEAD FINDINGS INTRACRANIAL CONTENTS: 1 cm rounded area of reduced diffusion RIGHT corona radiata. 1 cm reduced diffusion RIGHT frontal periventricular white matter/ body corpus callosum. Both areas demonstrate low ADC values. No susceptibility artifact to suggest hemorrhage. The  ventricles and sulci are mildly prominent for patient's age. Patchy to confluent supratentorial and pontine white matter FLAIR T2 hyperintensities. Small area LEFT mesial occipital lobe encephalomalacia. No suspicious parenchymal signal, masses or mass effect. No abnormal extra-axial fluid collections. No extra-axial masses though, contrast enhanced sequences would be more sensitive. Normal major intracranial vascular flow voids present at skull base. ORBITS: The included ocular globes and orbital contents are non-suspicious. Status post bilateral ocular lens implants. SINUSES: The mastoid air-cells and included paranasal sinuses are well-aerated. Known LEFT internal auditory canal schwannoma not conspicuous by noncontrast examination. SKULL/SOFT TISSUES: No abnormal sellar expansion. No suspicious calvarial bone marrow signal. Craniocervical junction maintained. MRA HEAD FINDINGS ANTERIOR CIRCULATION: Normal flow related enhancement of the included cervical, petrous, cavernous and supraclinoid internal carotid arteries. 2 mm laterally directed wide necked aneurysm RIGHT cavernous internal carotid artery is better seen on this high field strength examination. Patent anterior communicating artery. Normal flow related enhancement of the anterior and middle cerebral arteries, including distal segments. No large vessel occlusion, high-grade stenosis. Moderate luminal regularity of the middle cerebral arteries. POSTERIOR CIRCULATION: Codominant vertebral artery's. Basilar artery is patent, with normal flow related enhancement of the main branch vessels. Normal flow related enhancement of the posterior cerebral arteries. Moderate stenosis proximal RIGHT P3 segment. Moderate stenosis LEFT P3. Robust RIGHT posterior communicating artery. Luminal irregularity of the posterior cerebral arteries. No large vessel occlusion, aneurysm. IMPRESSION: MRI HEAD: Acute 1 cm RIGHT corona radiata and RIGHT frontal periventricular white  matter infarcts. Stable moderate global brain atrophy and moderate to severe chronic small vessel ischemic disease. 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 MRA HEAD: No emergent large vessel occlusion. 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 Moderate stenoses of the posterior cerebral arteries and general luminal irregularity of the intracranial vessels compatible with atherosclerosis. Electronically Signed   By: Elon Alas M.D.   On: 11/14/2015 22:56    Libni Fusaro, DO  [facility] Pager [phone]  If 7PM-7AM, please contact night-coverage [email or web address] Password [email or web address] 11/16/2015, 6:07 PM   LOS: 1 day

## 2015-11-16 NOTE — Progress Notes (Signed)
Physical Therapy Treatment Patient Details Name: Carmen Duncan MRN: [id number] DOB: 02/25/1941 Today's Date: 11/16/2015    History of Present Illness Patient is a 75 y/o female wtih hx of HTN, asthma, PNA ,breast cancer s/p Resection and radiation, essential tremor, interstitial lung disease, and remote stroke without residual deficits presents with left sided weakness. MRI-Acute 1 cm RIGHT corona radiata and RIGHT frontal periventricular white matter infarcts.    PT Comments    Patient progressing with gait with walker, but unsafe due to decreased L side awareness (walker not really impacting fall risk at this point.)  Berg balance score 26/56 (high fall risk.)  Agree with CIR level rehab prior to d/c home.   Follow Up Recommendations  CIR     Equipment Recommendations  Other (comment) (TBA)    Recommendations for Other Services       Precautions / Restrictions Precautions Precautions: Fall    Mobility  Bed Mobility               General bed mobility comments: Pt OOB in chair upon arrival.  Transfers Overall transfer level: Needs assistance Equipment used: None;Rolling walker (2 wheeled) Transfers: Sit to/from Stand Sit to Stand: Min guard;Min assist         General transfer comment: Min guard for safety. VCs for placement of L hand on RW.  Without device increased assist for balance  Ambulation/Gait Ambulation/Gait assistance: Min assist Ambulation Distance (Feet): 120 Feet Assistive device: Rolling walker (2 wheeled) Gait Pattern/deviations: Trendelenburg;Drifts right/left;Step-through pattern     General Gait Details: facilitation for L hip stabilization in stance   Stairs            Wheelchair Mobility    Modified Rankin (Stroke Patients Only) Modified Rankin (Stroke Patients Only) Pre-Morbid Rankin Score: Slight disability Modified Rankin: Moderately severe disability     Balance     Sitting balance-Leahy Scale: Good       Standing  balance-Leahy Scale: Poor                 High Level Balance Comments: performed side stepping with UE support    Cognition Arousal/Alertness: Awake/alert Behavior During Therapy: WFL for tasks assessed/performed Overall Cognitive Status: Impaired/Different from baseline       Memory: Decreased short-term memory   Safety/Judgement: Decreased awareness of deficits;Decreased awareness of safety          Exercises      General Comments        Pertinent Vitals/Pain Pain Assessment: No/denies pain    Home Living                      Prior Function            PT Goals (current goals can now be found in the care plan section) Progress towards PT goals: Progressing toward goals    Frequency  Min 4X/week    PT Plan Current plan remains appropriate    Co-evaluation             End of Session Equipment Utilized During Treatment: Gait belt Activity Tolerance: Patient tolerated treatment well Patient left: in chair;with call bell/phone within reach;with chair alarm set     Time: [id number] PT Time Calculation (min) (ACUTE ONLY): 23 min  Charges:  $Gait Training: 8-22 mins $Neuromuscular Re-education: 8-22 mins                    G Codes:  Carmen Duncan 11/16/2015, 5:21 PM Carmen Duncan, [place] 11/16/2015

## 2015-11-16 NOTE — Interval H&P Note (Signed)
History and Physical Interval Note:  11/16/2015 10:41 AM  Carmen Duncan  has presented today for surgery, with the diagnosis of stoke   The various methods of treatment have been discussed with the patient and family. After consideration of risks, benefits and other options for treatment, the patient has consented to  Procedure(s): TRANSESOPHAGEAL ECHOCARDIOGRAM (TEE) (N/A) as a surgical intervention .  The patient's history has been reviewed, patient examined, no change in status, stable for surgery.  I have reviewed the patient's chart and labs.  Questions were answered to the patient's satisfaction.     Tyra Gural [employer]

## 2015-11-16 NOTE — Interval H&P Note (Signed)
History and Physical Interval Note:  11/16/2015 11:51 AM  Carmen Duncan  has presented today for surgery, with the diagnosis of syncope  The various methods of treatment have been discussed with the patient and family. After consideration of risks, benefits and other options for treatment, the patient has consented to  Procedure(s): Loop Recorder Insertion (N/A) as a surgical intervention .  The patient's history has been reviewed, patient examined, no change in status, stable for surgery.  I have reviewed the patient's chart and labs.  Questions were answered to the patient's satisfaction.     [name]

## 2015-11-16 NOTE — Progress Notes (Signed)
TEE information given. Consent signed and in chart. Pt has been NPO since MN.    Teaira Croft, RN 

## 2015-11-16 NOTE — Progress Notes (Signed)
Physical Therapy Cancellation Note    11/16/15 1114  PT Visit Information  Last PT Received On 11/16/15  Reason Eval/Treat Not Completed Patient at procedure or test/unavailable; Pt off floor for TEE. PT will check on pt later as time allows.   History of Present Illness Patient is a 75 y/o female wtih hx of HTN, asthma, PNA ,breast cancer s/p Resection and radiation, essential tremor, interstitial lung disease, and remote stroke without residual deficits presents with left sided weakness. MRI-Acute 1 cm RIGHT corona radiata and RIGHT frontal periventricular white matter infarcts.   Earney Navy, PTA Pager: [phone]

## 2015-11-17 ENCOUNTER — Encounter (HOSPITAL_COMMUNITY): Payer: Self-pay | Admitting: *Deleted

## 2015-11-17 ENCOUNTER — Inpatient Hospital Stay (HOSPITAL_COMMUNITY)
Admission: RE | Admit: 2015-11-17 | Discharge: 2015-11-27 | DRG: 057 | Disposition: A | Discharge status: Home Health | Payer: Medicare Other | Source: Intra-hospital | Attending: Physical Medicine & Rehabilitation | Admitting: Physical Medicine & Rehabilitation

## 2015-11-17 DIAGNOSIS — I5032 Chronic diastolic (congestive) heart failure: Secondary | ICD-10-CM

## 2015-11-17 DIAGNOSIS — C50411 Malignant neoplasm of upper-outer quadrant of right female breast: Secondary | ICD-10-CM | POA: Diagnosis present

## 2015-11-17 DIAGNOSIS — F419 Anxiety disorder, unspecified: Secondary | ICD-10-CM | POA: Diagnosis present

## 2015-11-17 DIAGNOSIS — Z79899 Other long term (current) drug therapy: Secondary | ICD-10-CM | POA: Diagnosis not present

## 2015-11-17 DIAGNOSIS — K580 Irritable bowel syndrome with diarrhea: Secondary | ICD-10-CM | POA: Diagnosis not present

## 2015-11-17 DIAGNOSIS — E785 Hyperlipidemia, unspecified: Secondary | ICD-10-CM | POA: Diagnosis present

## 2015-11-17 DIAGNOSIS — G250 Essential tremor: Secondary | ICD-10-CM | POA: Diagnosis present

## 2015-11-17 DIAGNOSIS — J45909 Unspecified asthma, uncomplicated: Secondary | ICD-10-CM | POA: Insufficient documentation

## 2015-11-17 DIAGNOSIS — D333 Benign neoplasm of cranial nerves: Secondary | ICD-10-CM | POA: Diagnosis present

## 2015-11-17 DIAGNOSIS — I6330 Cerebral infarction due to thrombosis of unspecified cerebral artery: Secondary | ICD-10-CM | POA: Diagnosis not present

## 2015-11-17 DIAGNOSIS — I639 Cerebral infarction, unspecified: Secondary | ICD-10-CM | POA: Diagnosis present

## 2015-11-17 DIAGNOSIS — Z91040 Latex allergy status: Secondary | ICD-10-CM | POA: Diagnosis not present

## 2015-11-17 DIAGNOSIS — Z8673 Personal history of transient ischemic attack (TIA), and cerebral infarction without residual deficits: Secondary | ICD-10-CM

## 2015-11-17 DIAGNOSIS — I110 Hypertensive heart disease with heart failure: Secondary | ICD-10-CM | POA: Diagnosis not present

## 2015-11-17 DIAGNOSIS — G8194 Hemiplegia, unspecified affecting left nondominant side: Secondary | ICD-10-CM

## 2015-11-17 DIAGNOSIS — Z85841 Personal history of malignant neoplasm of brain: Secondary | ICD-10-CM

## 2015-11-17 DIAGNOSIS — Z882 Allergy status to sulfonamides status: Secondary | ICD-10-CM

## 2015-11-17 DIAGNOSIS — I69354 Hemiplegia and hemiparesis following cerebral infarction affecting left non-dominant side: Secondary | ICD-10-CM | POA: Diagnosis not present

## 2015-11-17 DIAGNOSIS — Z8249 Family history of ischemic heart disease and other diseases of the circulatory system: Secondary | ICD-10-CM

## 2015-11-17 DIAGNOSIS — E8809 Other disorders of plasma-protein metabolism, not elsewhere classified: Secondary | ICD-10-CM | POA: Diagnosis present

## 2015-11-17 DIAGNOSIS — I10 Essential (primary) hypertension: Secondary | ICD-10-CM | POA: Diagnosis present

## 2015-11-17 DIAGNOSIS — J849 Interstitial pulmonary disease, unspecified: Secondary | ICD-10-CM | POA: Diagnosis not present

## 2015-11-17 DIAGNOSIS — Z91048 Other nonmedicinal substance allergy status: Secondary | ICD-10-CM | POA: Diagnosis not present

## 2015-11-17 DIAGNOSIS — K219 Gastro-esophageal reflux disease without esophagitis: Secondary | ICD-10-CM | POA: Diagnosis present

## 2015-11-17 DIAGNOSIS — Z7902 Long term (current) use of antithrombotics/antiplatelets: Secondary | ICD-10-CM | POA: Diagnosis not present

## 2015-11-17 DIAGNOSIS — H9190 Unspecified hearing loss, unspecified ear: Secondary | ICD-10-CM | POA: Diagnosis present

## 2015-11-17 LAB — CBC
HCT: 44.8 % (ref 36.0–46.0)
HEMOGLOBIN: 14.6 g/dL (ref 12.0–15.0)
MCH: 29.7 pg (ref 26.0–34.0)
MCHC: 32.6 g/dL (ref 30.0–36.0)
MCV: 91.1 fL (ref 78.0–100.0)
Platelets: 161 10*3/uL (ref 150–400)
RBC: 4.92 MIL/uL (ref 3.87–5.11)
RDW: 14.2 % (ref 11.5–15.5)
WBC: 4.3 10*3/uL (ref 4.0–10.5)

## 2015-11-17 LAB — CREATININE, SERUM
CREATININE: 0.69 mg/dL (ref 0.44–1.00)
GFR calc Af Amer: >60 mL/min (ref >60)
GFR calc non Af Amer: >60 mL/min (ref >60)

## 2015-11-17 MED ORDER — PANTOPRAZOLE SODIUM 40 MG PO TBEC
40.0000 mg | DELAYED_RELEASE_TABLET | Freq: Every day | ORAL | Status: DC
  Administered 2015-11-18 – 2015-11-27 (×10): 40 mg via ORAL
  Filled 2015-11-17 (×11): qty 1

## 2015-11-17 MED ORDER — PROPRANOLOL HCL 20 MG PO TABS
20.0000 mg | ORAL_TABLET | Freq: Every day | ORAL | Status: DC
Start: 2015-11-18 — End: 2015-11-27
  Administered 2015-11-18 – 2015-11-27 (×10): 20 mg via ORAL
  Filled 2015-11-17 (×10): qty 1

## 2015-11-17 MED ORDER — ONDANSETRON HCL 4 MG/2ML IJ SOLN: 4.0000 mg | Freq: Four times a day (QID) | INTRAMUSCULAR | Status: DC | PRN

## 2015-11-17 MED ORDER — ENOXAPARIN SODIUM 40 MG/0.4ML ~~LOC~~ SOLN
40.0000 mg | SUBCUTANEOUS | Status: DC
  Administered 2015-11-18 – 2015-11-27 (×10): 40 mg via SUBCUTANEOUS
  Filled 2015-11-17 (×10): qty 0.4

## 2015-11-17 MED ORDER — ATORVASTATIN CALCIUM 20 MG PO TABS
20.0000 mg | ORAL_TABLET | Freq: Every day | ORAL | Status: DC
  Administered 2015-11-18 – 2015-11-27 (×10): 20 mg via ORAL
  Filled 2015-11-17 (×11): qty 1

## 2015-11-17 MED ORDER — SORBITOL 70 % SOLN
30.0000 mL | Freq: Every day | Status: DC | PRN
  Administered 2015-11-26: 30 mL via ORAL
  Filled 2015-11-17: qty 30

## 2015-11-17 MED ORDER — ONDANSETRON HCL 4 MG PO TABS
4.0000 mg | ORAL_TABLET | Freq: Four times a day (QID) | ORAL | Status: DC | PRN
  Administered 2015-11-18 – 2015-11-25 (×3): 4 mg via ORAL
  Filled 2015-11-17 (×3): qty 1

## 2015-11-17 MED ORDER — ENOXAPARIN SODIUM 40 MG/0.4ML ~~LOC~~ SOLN: 40.0000 mg | SUBCUTANEOUS | Status: DC

## 2015-11-17 MED ORDER — ACETAMINOPHEN 325 MG PO TABS
650.0000 mg | ORAL_TABLET | Freq: Four times a day (QID) | ORAL | Status: DC | PRN
Start: 2015-11-17 — End: 2015-11-22
  Administered 2015-11-17: 325 mg via ORAL
  Administered 2015-11-19: 650 mg via ORAL
  Administered 2015-11-20: 325 mg via ORAL
  Administered 2015-11-20 – 2015-11-21 (×2): 650 mg via ORAL
  Administered 2015-11-22 (×2): 325 mg via ORAL
  Filled 2015-11-17 (×8): qty 2

## 2015-11-17 MED ORDER — CILIDINIUM-CHLORDIAZEPOXIDE 2.5-5 MG PO CAPS
1.0000 | ORAL_CAPSULE | Freq: Two times a day (BID) | ORAL | Status: DC | PRN
  Administered 2015-11-26: 1 via ORAL
  Filled 2015-11-17 (×3): qty 1

## 2015-11-17 MED ORDER — SENNOSIDES-DOCUSATE SODIUM 8.6-50 MG PO TABS: 1.0000 | ORAL_TABLET | Freq: Every evening | ORAL | Status: DC | PRN

## 2015-11-17 MED ORDER — CLOPIDOGREL BISULFATE 75 MG PO TABS
75.0000 mg | ORAL_TABLET | Freq: Every day | ORAL | Status: DC
  Administered 2015-11-18 – 2015-11-27 (×10): 75 mg via ORAL
  Filled 2015-11-17 (×11): qty 1

## 2015-11-17 MED ORDER — NINTEDANIB ESYLATE 100 MG PO CAPS
100.0000 mg | ORAL_CAPSULE | Freq: Two times a day (BID) | ORAL | Status: DC
  Administered 2015-11-17 – 2015-11-18 (×3): 100 mg via ORAL
  Administered 2015-11-19: 1 mg via ORAL
  Administered 2015-11-19 – 2015-11-27 (×16): 100 mg via ORAL
  Filled 2015-11-17 (×11): qty 1

## 2015-11-17 NOTE — Progress Notes (Signed)
Received message from [facility] with inpatient rehab, she has obtained insurance authization for admission today. Mindi Slicker [facility] [phone]

## 2015-11-17 NOTE — Progress Notes (Signed)
Gunnar Fusi Rehab Admission Coordinator Signed Physical Medicine and Rehabilitation PMR Pre-admission 11/17/2015 1:38 PM  Related encounter: ED to Hosp-Admission (Current) from 11/14/2015 in [place] Collapse All   PMR Admission Coordinator Pre-Admission Assessment  Patient: Carmen Duncan is an 75 y.o., female MRN: [id number] DOB: 05/02/1941 Height: 5\' 1"  Weight: 127 lbs.  Insurance Information HMO: X PPO: PCP: IPA: 80/20: OTHER:  PRIMARY: BCBS Medicare Policy#: [id number] Subscriber: Self CM Name: Pieter Partridge Phone#: [phone] Fax#: [phone] Pre-Cert#: Employer: Retired  Benefits: Phone #: [phone] Name: Kristie Cowman. Date: 05/28/15 Deduct: $0 Out of Pocket Max: $6,700 Life Max: None CIR: $300 a day, days 1-6 then $0 SNF: $0 for days 1-20, then $154.50 days 21-100 Outpatient: OT/PT/SLP Co-Pay: $40 Home Health: OT/PT/SLP Co-Pay: $0 DME: prior approval if greater than $600 Co-Pay: 2% co-insurance  Providers: in-network  Medicaid Application Date: Case Manager:  Disability Application Date: Case Worker:   Emergency Facilities manager Information    Name Relation Home Work Mobile   Camp,Mitch Son [phone] Relative [phone]  [phone]     Current Medical History  Patient Admitting Diagnosis: 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 right handed female with history of hypertension, hyperlipidemia, breast cancer status post resection and radiation, essential tremor, remote stroke in the past without residual  deficits maintained on Plavix. Patient lives with son and family. 2 level home with bedroom upstairs. Independent prior to admission. Presented 11/14/2015 a left-sided weakness. MRI of the brain showed acute 1 cm right corona radiata and right frontal periventricular white matter infarct. 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 MRA of the head with no emergent large vessel occlusion. TEE with EF 55-60 % no thrombus and Loop recorder placed 11/16/2015. [name] Patient did not receive TPA. Neurology consulted presently remains on Plavix for CVA prophylaxis. Subcutaneous Lovenox for DVT prophylaxis. Physical and occupational therapy evaluation completed 11/15/2015 with recommendations of physical medicine rehabilitation consult. Patient was admitted for comprehensive rehabilitation program.  NIH Total: 1    Past Medical History  Past Medical History  Diagnosis Date  . Arthritis   . Asthma   . Hypertension   . GERD (gastroesophageal reflux disease)   . Hyperlipidemia   . Diverticulosis   . IBS (irritable bowel syndrome)   . HOH (hard of hearing) left ear  . Acoustic neuroma ([place])   . Ejection fraction   . Breast cancer of upper-outer quadrant of right female breast ([place]) 07/19/2015  . Brain cancer ([place])   . Anxiety   . Stroke [facility])     no deficits, on plavix  . Essential tremor     on inderal  . Pneumonia     04-2014, CAP  . Interstitial lung disease ([place])     Family History  family history includes Heart disease (age of onset: 42) in her father; Hyperlipidemia in her father and mother; Hypertension in her mother; Lung cancer in her paternal uncle; Stroke in her father.  Prior Rehab/Hospitalizations:  Has the patient had major surgery during 100 days prior to admission? No  Current Medications   Current facility-administered medications:  . acetaminophen (TYLENOL) tablet 650 mg, 650 mg, Oral, Q6H PRN, Vianne Bulls, MD, 650 mg at  11/17/15 1023 . atorvastatin (LIPITOR) tablet 20 mg, 20 mg, Oral, Daily, Ilene Qua Opyd, MD, 20 mg at 11/17/15 0912 .  clidinium-chlordiazePOXIDE (LIBRAX) 2.5-5 mg per capsule, 1 capsule, Oral, BID PRN, Ilene Qua Opyd, MD . clopidogrel (PLAVIX) tablet 75 mg, 75 mg, Oral, Daily, Vianne Bulls, MD, 75 mg at 11/17/15 0912 . enoxaparin (LOVENOX) injection 40 mg, 40 mg, Subcutaneous, Q24H, Ilene Qua Opyd, MD, 40 mg at 11/17/15 0912 . Nintedanib ([name]) CAPS 100 mg, 100 mg, Oral, BID, Ilene Qua Opyd, MD, 100 mg at 11/16/15 1321 . pantoprazole (PROTONIX) EC tablet 40 mg, 40 mg, Oral, Daily, Ilene Qua Opyd, MD, 40 mg at 11/17/15 0912 . propranolol (INDERAL) tablet 20 mg, 20 mg, Oral, Daily, Orson Eva, MD, 20 mg at 11/17/15 0912 . senna-docusate (Senokot-S) tablet 1 tablet, 1 tablet, Oral, QHS PRN, Vianne Bulls, MD  Patients Current Diet: Diet Heart Room service appropriate?: Yes; Fluid consistency:: Thin Diet - low sodium heart healthy  Precautions / Restrictions Precautions Precautions: Fall Restrictions Weight Bearing Restrictions: No   Has the patient had 2 or more falls or a fall with injury in the past year?Yes, fell a day prior to admission which resulted in left arm bruise  Prior Activity Level Community (5-7x/wk): Patient was independent, driving, and out in the community daily prior to admission.   Home Assistive Devices / Equipment Home Equipment: Environmental consultant - 2 wheels, Shower seat - built in, [employer] - tub/shower  Prior Device Use: Indicate devices/aids used by the patient prior to current illness, exacerbation or injury? None of the above  Prior Functional Level Prior Function Level of Independence: Independent Comments: Cooks, cleans, drives. No falls reported.  Self Care: Did the patient need help bathing, dressing, using the toilet or eating? Independent  Indoor Mobility: Did the patient need assistance with walking from room to room (with or without device)?  Independent  Stairs: Did the patient need assistance with internal or external stairs (with or without device)? Independent  Functional Cognition: Did the patient need help planning regular tasks such as shopping or remembering to take medications? Independent  Current Functional Level Cognition  Arousal/Alertness: Awake/alert Overall Cognitive Status: Impaired/Different from baseline Orientation Level: Oriented X4 Safety/Judgement: Decreased awareness of safety, Decreased awareness of deficits Attention: Selective Selective Attention: Appears intact Memory: Impaired Memory Impairment: Decreased recall of new information, Decreased short term memory Decreased Short Term Memory: Verbal basic Awareness: Appears intact Safety/Judgment: Appears intact   Extremity Assessment (includes Sensation/Coordination)  Upper Extremity Assessment: LUE deficits/detail LUE Deficits / Details: Overall 3/5 strength throughout LUE. Decreased shoulder AROM, full PROM. Pt reports pain with shoulder PROM. Pt report distal sensation changes. LUE: Unable to fully assess due to pain LUE Sensation: decreased light touch LUE Coordination: decreased fine motor, decreased gross motor  Lower Extremity Assessment: Defer to PT evaluation LLE Deficits / Details: Grossly ~3/5 throughout LLE LLE Sensation: [facility].)    ADLs  Overall ADL's : Needs assistance/impaired Eating/Feeding: Set up, Sitting Grooming: Standing, Oral care, Brushing hair, Sitting, Applying deodorant, Min guard, Wash/dry face Grooming Details (indicate cue type and reason): Decreased L hand fine motor coordination but attempting to use functionally.  Upper Body Bathing: Set up, Min guard, Sitting Upper Body Bathing Details (indicate cue type and reason): Cues for sitting upright. Pt with L lateral lean during functional activities. Lower Body Bathing: Minimal assistance, Sit to/from stand Lower Body Bathing Details (indicate cue type and  reason): Assist for balance in standing. Upper Body Dressing : Set up, Min guard, Sitting Upper Body Dressing Details (indicate cue type and reason): to doff/don hopsital gown Lower Body Dressing: Minimal assistance, Sitting/lateral  leans Lower Body Dressing Details (indicate cue type and reason): Assist to pull up L side of underwear. Pt able to doff/don socks and underwear with increased time. Toilet Transfer: Min guard, Ambulation, Cueing for sequencing, Cueing for safety, Comfort height toilet, RW Toilet Transfer Details (indicate cue type and reason): Pt running into things on her L side with functional mobility; VCs to correct. Toileting- Clothing Manipulation and Hygiene: Minimal assistance, Sit to/from stand Toileting - Clothing Manipulation Details (indicate cue type and reason): Assist to pull up L side of underwear. Functional mobility during ADLs: Min guard, Rolling walker, Cueing for safety General ADL Comments: Pt continues to demonstrate L UE coordination deficits impacting her independence and safety with ADLs and functional mobility. Required VCs for keeping L hand on RW. Encouraged use of LUE during functional activities; pt able to return demo understanding.    Mobility  General bed mobility comments: Pt OOB in chair upon arrival.    Transfers  Overall transfer level: Needs assistance Equipment used: None Transfers: Sit to/from Stand Sit to Stand: Min guard General transfer comment: Min guard for safety. Multiple sit <> stands from chair during ADL.    Ambulation / Gait / Stairs / Wheelchair Mobility  Ambulation/Gait Ambulation/Gait assistance: Museum/gallery curator (Feet): 120 Feet Assistive device: Rolling walker (2 wheeled) Gait Pattern/deviations: Trendelenburg, Drifts right/left, Step-through pattern General Gait Details: facilitation for L hip stabilization in stance Gait velocity: decreased Gait velocity interpretation: Below normal speed for  age/gender    Posture / Balance Dynamic Sitting Balance Sitting balance - Comments: L lateral lean when participating in functional activities. Able to correct with VCs. Balance Overall balance assessment: Needs assistance Sitting-balance support: Feet supported, No upper extremity supported Sitting balance-Leahy Scale: Fair Sitting balance - Comments: L lateral lean when participating in functional activities. Able to correct with VCs. Standing balance support: No upper extremity supported, During functional activity Standing balance-Leahy Scale: Poor Standing balance comment: Able to brush teeth and wash face standing at sink with Min guard assist for safety and assist with setup. High Level Balance Comments: performed side stepping with UE support Standardized Balance Assessment Standardized Balance Assessment : Berg Balance Test Berg Balance Test Sit to Stand: Able to stand independently using hands Standing Unsupported: Able to stand 2 minutes with supervision Sitting with Back Unsupported but Feet Supported on Floor or Stool: Able to sit safely and securely 2 minutes Stand to Sit: Controls descent by using hands Transfers: Needs one person to assist Standing Unsupported with Eyes Closed: Able to stand 10 seconds with supervision Standing Ubsupported with Feet Together: Needs help to attain position and unable to hold for 15 seconds From Standing, Reach Forward with Outstretched Arm: Can reach forward >12 cm safely (5") From Standing Position, Pick up Object from Floor: Able to pick up shoe, needs supervision From Standing Position, Turn to Look Behind Over each Shoulder: Needs supervision when turning Turn 360 Degrees: Needs assistance while turning Standing Unsupported, Alternately Place Feet on Step/Stool: Able to complete >2 steps/needs minimal assist Standing Unsupported, One Foot in Front: Needs help to step but can hold 15 seconds Standing on One Leg: Unable to try or  needs assist to prevent fall Total Score: 26    Special needs/care consideration BiPAP/CPAP: No CPM: No Continuous Drip IV: No Dialysis: No  Life Vest: No Oxygen: No Special Bed: No Trach Size: No Wound Vac (area): No  Skin: Bruise Location: Left arm Bowel mgmt: 11/16/15 Bladder mgmt: Urgency, could benefit from timed  tolieting  Diabetic mgmt: No     Previous Home Environment Living Arrangements: Children Lives With: Son Available Help at Discharge: Family, Available PRN/intermittently Type of Home: House Home Layout: Two level, Bed/bath upstairs Alternate Level Stairs-Rails: Right Alternate Level Stairs-Number of Steps: 1 flight Home Access: Stairs to enter Entrance Stairs-Rails: Right Entrance Stairs-Number of Steps: 4-5 Bathroom Shower/Tub: Multimedia programmer: Handicapped height  Discharge Living Setting Plans for Discharge Living Setting: Lives with (comment) (son and daughter-in-law) Type of Home at Discharge: House Discharge Home Layout: Two level Alternate Level Stairs-Rails: Right Alternate Level Stairs-Number of Steps: 16 Discharge Home Access: Stairs to enter Entrance Stairs-Rails: Right Entrance Stairs-Number of Steps: 3 Discharge Bathroom Shower/Tub: Walk-in shower Discharge Bathroom Toilet: Handicapped height Discharge Bathroom Accessibility: No Does the patient have any problems obtaining your medications?: No  Social/Family/Support Systems Patient Roles: Parent, Other (Comment) (grandparent ) Anticipated Caregiver: Son and daughter-in-law Anticipated Caregiver's Contact Information: [name] and Lonzo Cloud [phone] Ability/Limitations of Caregiver: they work Building control surveyor Availability: Intermittent Discharge Plan Discussed with Primary Caregiver: Yes Is Caregiver In Agreement with Plan?: Yes Does Caregiver/Family have Issues with Lodging/Transportation while Pt is in Rehab?:  No  Goals/Additional Needs Patient/Family Goal for Rehab: Mod I PT/OT/SLP Expected length of stay: about 7 days  Cultural Considerations: None Dietary Needs: None Equipment Needs: TBD Special Service Needs: None Additional Information: None Pt/Family Agrees to Admission and willing to participate: Yes Program Orientation Provided & Reviewed with Pt/Caregiver Including Roles & Responsibilities: Yes Additional Information Needs: None Information Needs to be Provided By: N/A  Decrease burden of Care through IP rehab admission: No  Possible need for SNF placement upon discharge: No  Patient Condition: This patient's medical and functional status has changed since the consult dated 11/15/15 in which the Rehabilitation Physician determined and documented that the patient was potentially appropriate for intensive rehabilitative care in an inpatient rehabilitation facility if therapy needs were still present after completion of medical work-up. Issues have been addressed and update has been discussed with Dr. Naaman Plummer and patient now appropriate for inpatient rehabilitation. Will admit to inpatient rehab today.   Preadmission Screen Completed By: Gunnar Fusi, 11/17/2015 1:38 PM ______________________________________________________________________  Discussed status with Dr. Naaman Plummer on 11/17/15 at 1355 and received telephone approval for admission today.  Admission Coordinator: Gunnar Fusi, time 1355/Date 11/17/15          Cosigned by: Meredith Staggers, MD at 11/17/2015 3:04 PM  Revision History     Date/Time User Provider Type Action   11/17/2015 3:04 PM Meredith Staggers, MD Physician Cosign   11/17/2015 2:57 PM Gunnar Fusi Rehab Admission Coordinator Sign

## 2015-11-17 NOTE — Discharge Summary (Signed)
Physician Discharge Summary  Carmen Duncan [id number] DOB: 03/24/1941 DOA: 11/14/2015  PCP: [name], MD  Admit date: 11/14/2015 Discharge date: 11/17/2015  Admitted From: home Disposition:  CIR  Recommendations for Outpatient Follow-up:  1. Follow up with PCP in 1-2 weeks  Discharge Condition:stable CODE STATUS:FULL Diet recommendation: Heart Healthy  Brief/Interim Summary: 75 y.o. female with hx of hypertension, hyperlipidemia, stroke, pulmonary hypertension and breast cancer presenting with new onset left-sided weakness. Time of onset is unclear (LKW 11/13/2015). She was noted to be weak yesterday and had some apparent difficulty going upstairs. She fell and injured her left arm earlier this morning. Family members noted left facial droop earlier on 6/20. She's been on Plavix 75 mg per day. CT scan of the head showed no acute intracranial abnormality. Neurology was consulted, and full stroke workup was taken.  Discharge Diagnoses:  1. 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 Stable small R ICA aneurysm. Intracranial atherosclerosis -Carotid Duplex--no hemodynamically significant stenosis -11/16/15-TEE--Normal LV size with mild LVH. EF 55-60%; Negative bubble study, no evidence for ASD or PFO; No source of embolus. -LDL--64 -HbA1C--5.4 -11/17/15--loop recorder placed  2. Hypertension  - restarted propranolol on 6/22 - Plan to permit HTN for now in setting of suspected acute ischemic CVA  - Treat for SBP >220, or DBP >120  - BMP remained acceptable with SBP 140-160  3. Idiopathic pulmonary fibrosis  - Followed by pulmonology in outpt setting  - Recently started on [name], will  continue  -stable on RA  4. Hyperlipidemia  - Continue current management with Lipitor 20 mg qHS - LDL 64  5. GERD, IBS - Stable - Continue current management with Protonix and prn Librax   6. Essential tremor  - Stable  - Managed with propranolol   7. Breast cancer, right - Right breast, upper-outer quadrant, DCIS  - Underwent lumpectomy in March 2017 with sampling of 2 sentinel nodes; margins were clean and both LN's clear  - Completed adjuvant RT  - Under surveilence with oncology    Discharge Instructions  Discharge Instructions    Diet - low sodium heart healthy    Complete by:  As directed      Increase activity slowly    Complete by:  As directed             Medication List    TAKE these medications        acetaminophen 325 MG tablet  Commonly known as:  TYLENOL  Take 650 mg by mouth every 6 (six) hours as needed for headache.     atorvastatin 20 MG tablet  Commonly known as:  LIPITOR  TAKE ONE TABLET BY MOUTH ONCE DAILY     clidinium-chlordiazePOXIDE 5-2.5 MG capsule  Commonly known as:  LIBRAX  Take 1 capsule by mouth 2 (two) times daily as needed.     clopidogrel 75 MG tablet  Commonly known as:  PLAVIX  Take 1 tablet (75 mg total) by mouth daily.     [name] 100 MG Caps  Generic drug:  Nintedanib  Take 100 mg by mouth 2 (two) times daily.     pantoprazole 40 MG tablet  Commonly known as:  PROTONIX  TAKE ONE TABLET BY MOUTH ONCE DAILY     propranolol 20  MG tablet  Commonly known as:  INDERAL  Take 1 tablet (20 mg total) by mouth daily.           Follow-up Information    Follow up with [facility] On 11/27/2015.   Specialty:  Cardiology   Why:  at 4:30PM for wound check    Contact information:   [address], [place] [phone]      Follow up with Latifah Padin, REBECCA, DO. Schedule an appointment as soon as possible for a visit in 1 month.   Specialty:  Neurology    Contact information:   [address]  [address] [place] [address] [phone]      Allergies  Allergen Reactions  . Latex Itching, Rash and Other (See Comments)    Red angry skin from latex tape  . Sulfonamide Derivatives Rash    hives  . Tape Itching and Rash    Please use paper tape or coban wrap!!    Consultations:  Neurology   Procedures/Studies: Dg Chest 2 View  11/14/2015  CLINICAL DATA:  CEREBRUM , stroke EXAM: CHEST - 2 VIEW COMPARISON:  CT 09/04/2015 and previous FINDINGS: Coarse linear scarring or subsegmental atelectasis in the lung bases, improved since previous radiograph of 06/08/2014. No confluent airspace disease or overt edema. Heart size upper limits normal. Atheromatous aorta, mildly tortuous. No effusion.  No pneumothorax.  Surgical clips right axilla. Anterior vertebral endplate spurring at multiple levels in the mid thoracic spine. IMPRESSION: 1. Coarse probably chronic bibasilar interstitial opacities. No definite acute or superimposed abnormality. 2.  Aortic Atherosclerosis (ICD10-170.0) Electronically Signed   By: Lucrezia Europe M.D.   On: 11/14/2015 22:42   Ct Head Wo Contrast  11/14/2015  CLINICAL DATA:  Acute onset of stroke-like symptoms. Initial encounter. EXAM: CT HEAD WITHOUT CONTRAST TECHNIQUE: Contiguous axial images were obtained from the base of the skull through the vertex without intravenous contrast. COMPARISON:  MRI of the brain performed 12/16/2014 FINDINGS: There is no evidence of acute infarction, mass lesion, or intra- or extra-axial hemorrhage on CT. Prominence of the ventricles and sulci reflects mild cortical volume loss. Scattered periventricular and subcortical white matter change likely reflects small vessel ischemic microangiopathy. The brainstem and fourth ventricle are within normal limits. The basal ganglia are unremarkable in appearance. The cerebral hemispheres demonstrate grossly normal gray-white differentiation. No mass  effect or midline shift is seen. There is no evidence of fracture; visualized osseous structures are unremarkable in appearance. The orbits are within normal limits. The paranasal sinuses and mastoid air cells are well-aerated. No significant soft tissue abnormalities are seen. IMPRESSION: 1. No acute intracranial pathology seen on CT. 2. Mild cortical volume loss and scattered small vessel ischemic microangiopathy. Electronically Signed   By: Garald Balding M.D.   On: 11/14/2015 19:46   Mr Angiogram Head Wo Contrast  11/14/2015  CLINICAL DATA:  New onset LEFT-sided weakness, possibly beginning yesterday. LEFT facial droop today. History of hypertension, hyperlipidemia, stroke, breast cancer. EXAM: MRI HEAD WITHOUT CONTRAST MRA HEAD WITHOUT CONTRAST TECHNIQUE: Multiplanar, multiecho pulse sequences of the brain and surrounding structures were obtained without intravenous contrast. Angiographic images of the head were obtained using MRA technique without contrast. COMPARISON:  CT HEAD November 14, 2015 at 1920 hours and MRI of the brain December 16, 2014 and MRA head August 23, 2014. FINDINGS: MRI HEAD FINDINGS INTRACRANIAL CONTENTS: 1 cm rounded area of reduced diffusion RIGHT corona radiata. 1 cm reduced diffusion  RIGHT frontal periventricular white matter/ body corpus callosum. Both areas demonstrate low ADC values. No susceptibility artifact to suggest hemorrhage. The ventricles and sulci are mildly prominent for patient's age. Patchy to confluent supratentorial and pontine white matter FLAIR T2 hyperintensities. Small area LEFT mesial occipital lobe encephalomalacia. No suspicious parenchymal signal, masses or mass effect. No abnormal extra-axial fluid collections. No extra-axial masses though, contrast enhanced sequences would be more sensitive. Normal major intracranial vascular flow voids present at skull base. ORBITS: The included ocular globes and orbital contents are non-suspicious. Status post bilateral ocular  lens implants. SINUSES: The mastoid air-cells and included paranasal sinuses are well-aerated. Known LEFT internal auditory canal schwannoma not conspicuous by noncontrast examination. SKULL/SOFT TISSUES: No abnormal sellar expansion. No suspicious calvarial bone marrow signal. Craniocervical junction maintained. MRA HEAD FINDINGS ANTERIOR CIRCULATION: Normal flow related enhancement of the included cervical, petrous, cavernous and supraclinoid internal carotid arteries. 2 mm laterally directed wide necked aneurysm RIGHT cavernous internal carotid artery is better seen on this high field strength examination. Patent anterior communicating artery. Normal flow related enhancement of the anterior and middle cerebral arteries, including distal segments. No large vessel occlusion, high-grade stenosis. Moderate luminal regularity of the middle cerebral arteries. POSTERIOR CIRCULATION: Codominant vertebral artery's. Basilar artery is patent, with normal flow related enhancement of the main branch vessels. Normal flow related enhancement of the posterior cerebral arteries. Moderate stenosis proximal RIGHT P3 segment. Moderate stenosis LEFT P3. Robust RIGHT posterior communicating artery. Luminal irregularity of the posterior cerebral arteries. No large vessel occlusion, aneurysm. IMPRESSION: MRI HEAD: Acute 1 cm RIGHT corona radiata and RIGHT frontal periventricular white matter infarcts. Stable moderate global brain atrophy and moderate to severe chronic small vessel ischemic disease. 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 MRA HEAD: No emergent large vessel occlusion. 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 Moderate stenoses of the posterior cerebral arteries and general luminal irregularity of the intracranial vessels compatible with atherosclerosis. Electronically Signed   By: Elon Alas M.D.   On: 11/14/2015 22:56   Mr Brain Wo Contrast  11/14/2015  CLINICAL DATA:  New onset LEFT-sided weakness,  possibly beginning yesterday. LEFT facial droop today. History of hypertension, hyperlipidemia, stroke, breast cancer. EXAM: MRI HEAD WITHOUT CONTRAST MRA HEAD WITHOUT CONTRAST TECHNIQUE: Multiplanar, multiecho pulse sequences of the brain and surrounding structures were obtained without intravenous contrast. Angiographic images of the head were obtained using MRA technique without contrast. COMPARISON:  CT HEAD November 14, 2015 at 1920 hours and MRI of the brain December 16, 2014 and MRA head August 23, 2014. FINDINGS: MRI HEAD FINDINGS INTRACRANIAL CONTENTS: 1 cm rounded area of reduced diffusion RIGHT corona radiata. 1 cm reduced diffusion RIGHT frontal periventricular white matter/ body corpus callosum. Both areas demonstrate low ADC values. No susceptibility artifact to suggest hemorrhage. The ventricles and sulci are mildly prominent for patient's age. Patchy to confluent supratentorial and pontine white matter FLAIR T2 hyperintensities. Small area LEFT mesial occipital lobe encephalomalacia. No suspicious parenchymal signal, masses or mass effect. No abnormal extra-axial fluid collections. No extra-axial masses though, contrast enhanced sequences would be more sensitive. Normal major intracranial vascular flow voids present at skull base. ORBITS: The included ocular globes and orbital contents are non-suspicious. Status post bilateral ocular lens implants. SINUSES: The mastoid air-cells and included paranasal sinuses are well-aerated. Known LEFT internal auditory canal schwannoma not conspicuous by noncontrast examination. SKULL/SOFT TISSUES: No abnormal sellar expansion. No suspicious calvarial bone marrow signal. Craniocervical junction maintained. MRA HEAD FINDINGS ANTERIOR CIRCULATION: Normal flow related  enhancement of the included cervical, petrous, cavernous and supraclinoid internal carotid arteries. 2 mm laterally directed wide necked aneurysm RIGHT cavernous internal carotid artery is better seen on this  high field strength examination. Patent anterior communicating artery. Normal flow related enhancement of the anterior and middle cerebral arteries, including distal segments. No large vessel occlusion, high-grade stenosis. Moderate luminal regularity of the middle cerebral arteries. POSTERIOR CIRCULATION: Codominant vertebral artery's. Basilar artery is patent, with normal flow related enhancement of the main branch vessels. Normal flow related enhancement of the posterior cerebral arteries. Moderate stenosis proximal RIGHT P3 segment. Moderate stenosis LEFT P3. Robust RIGHT posterior communicating artery. Luminal irregularity of the posterior cerebral arteries. No large vessel occlusion, aneurysm. IMPRESSION: MRI HEAD: Acute 1 cm RIGHT corona radiata and RIGHT frontal periventricular white matter infarcts. Stable moderate global brain atrophy and moderate to severe chronic small vessel ischemic disease. 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 MRA HEAD: No emergent large vessel occlusion. 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 Moderate stenoses of the posterior cerebral arteries and general luminal irregularity of the intracranial vessels compatible with atherosclerosis. Electronically Signed   By: Elon Alas M.D.   On: 11/14/2015 22:56         Discharge Exam: Filed Vitals:   11/17/15 0531 11/17/15 0903  BP: 163/65 144/80  Pulse: 83 85  Temp: 98.5 F (36.9 C) 97.5 F (36.4 C)  Resp: 19 20   Filed Vitals:   11/16/15 2135 11/17/15 0137 11/17/15 0531 11/17/15 0903  BP: 156/85 154/76 163/65 144/80  Pulse: 81 73 83 85  Temp: 97.6 F (36.4 C) 97.9 F (36.6 C) 98.5 F (36.9 C) 97.5 F (36.4 C)  TempSrc: Oral Oral Oral Oral  Resp: 17 18 19 20   SpO2: 97% 97% 100% 100%    General: Pt is alert, awake, not in acute distress Cardiovascular: RRR, S1/S2 +, no rubs, no gallops Respiratory: bibasilar crackles, no wheeze Abdominal: Soft, NT, ND, bowel sounds + Extremities: no  edema, no cyanosis   The results of significant diagnostics from this hospitalization (including imaging, microbiology, ancillary and laboratory) are listed below for reference.    Significant Diagnostic Studies: Dg Chest 2 View  11/14/2015  CLINICAL DATA:  CEREBRUM , stroke EXAM: CHEST - 2 VIEW COMPARISON:  CT 09/04/2015 and previous FINDINGS: Coarse linear scarring or subsegmental atelectasis in the lung bases, improved since previous radiograph of 06/08/2014. No confluent airspace disease or overt edema. Heart size upper limits normal. Atheromatous aorta, mildly tortuous. No effusion.  No pneumothorax.  Surgical clips right axilla. Anterior vertebral endplate spurring at multiple levels in the mid thoracic spine. IMPRESSION: 1. Coarse probably chronic bibasilar interstitial opacities. No definite acute or superimposed abnormality. 2.  Aortic Atherosclerosis (ICD10-170.0) Electronically Signed   By: Lucrezia Europe M.D.   On: 11/14/2015 22:42   Ct Head Wo Contrast  11/14/2015  CLINICAL DATA:  Acute onset of stroke-like symptoms. Initial encounter. EXAM: CT HEAD WITHOUT CONTRAST TECHNIQUE: Contiguous axial images were obtained from the base of the skull through the vertex without intravenous contrast. COMPARISON:  MRI of the brain performed 12/16/2014 FINDINGS: There is no evidence of acute infarction, mass lesion, or intra- or extra-axial hemorrhage on CT. Prominence of the ventricles and sulci reflects mild cortical volume loss. Scattered periventricular and subcortical white matter change likely reflects small vessel ischemic microangiopathy. The brainstem and fourth ventricle are within normal limits. The basal ganglia are unremarkable in appearance. The cerebral hemispheres demonstrate grossly normal gray-white differentiation. No mass  effect or midline shift is seen. There is no evidence of fracture; visualized osseous structures are unremarkable in appearance. The orbits are within normal limits. The  paranasal sinuses and mastoid air cells are well-aerated. No significant soft tissue abnormalities are seen. IMPRESSION: 1. No acute intracranial pathology seen on CT. 2. Mild cortical volume loss and scattered small vessel ischemic microangiopathy. Electronically Signed   By: Garald Balding M.D.   On: 11/14/2015 19:46   Mr Angiogram Head Wo Contrast  11/14/2015  CLINICAL DATA:  New onset LEFT-sided weakness, possibly beginning yesterday. LEFT facial droop today. History of hypertension, hyperlipidemia, stroke, breast cancer. EXAM: MRI HEAD WITHOUT CONTRAST MRA HEAD WITHOUT CONTRAST TECHNIQUE: Multiplanar, multiecho pulse sequences of the brain and surrounding structures were obtained without intravenous contrast. Angiographic images of the head were obtained using MRA technique without contrast. COMPARISON:  CT HEAD November 14, 2015 at 1920 hours and MRI of the brain December 16, 2014 and MRA head August 23, 2014. FINDINGS: MRI HEAD FINDINGS INTRACRANIAL CONTENTS: 1 cm rounded area of reduced diffusion RIGHT corona radiata. 1 cm reduced diffusion RIGHT frontal periventricular white matter/ body corpus callosum. Both areas demonstrate low ADC values. No susceptibility artifact to suggest hemorrhage. The ventricles and sulci are mildly prominent for patient's age. Patchy to confluent supratentorial and pontine white matter FLAIR T2 hyperintensities. Small area LEFT mesial occipital lobe encephalomalacia. No suspicious parenchymal signal, masses or mass effect. No abnormal extra-axial fluid collections. No extra-axial masses though, contrast enhanced sequences would be more sensitive. Normal major intracranial vascular flow voids present at skull base. ORBITS: The included ocular globes and orbital contents are non-suspicious. Status post bilateral ocular lens implants. SINUSES: The mastoid air-cells and included paranasal sinuses are well-aerated. Known LEFT internal auditory canal schwannoma not conspicuous by  noncontrast examination. SKULL/SOFT TISSUES: No abnormal sellar expansion. No suspicious calvarial bone marrow signal. Craniocervical junction maintained. MRA HEAD FINDINGS ANTERIOR CIRCULATION: Normal flow related enhancement of the included cervical, petrous, cavernous and supraclinoid internal carotid arteries. 2 mm laterally directed wide necked aneurysm RIGHT cavernous internal carotid artery is better seen on this high field strength examination. Patent anterior communicating artery. Normal flow related enhancement of the anterior and middle cerebral arteries, including distal segments. No large vessel occlusion, high-grade stenosis. Moderate luminal regularity of the middle cerebral arteries. POSTERIOR CIRCULATION: Codominant vertebral artery's. Basilar artery is patent, with normal flow related enhancement of the main branch vessels. Normal flow related enhancement of the posterior cerebral arteries. Moderate stenosis proximal RIGHT P3 segment. Moderate stenosis LEFT P3. Robust RIGHT posterior communicating artery. Luminal irregularity of the posterior cerebral arteries. No large vessel occlusion, aneurysm. IMPRESSION: MRI HEAD: Acute 1 cm RIGHT corona radiata and RIGHT frontal periventricular white matter infarcts. Stable moderate global brain atrophy and moderate to severe chronic small vessel ischemic disease. 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 MRA HEAD: No emergent large vessel occlusion. 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 Moderate stenoses of the posterior cerebral arteries and general luminal irregularity of the intracranial vessels compatible with atherosclerosis. Electronically Signed   By: Elon Alas M.D.   On: 11/14/2015 22:56   Mr Brain Wo Contrast  11/14/2015  CLINICAL DATA:  New onset LEFT-sided weakness, possibly beginning yesterday. LEFT facial droop today. History of hypertension, hyperlipidemia, stroke, breast cancer. EXAM: MRI HEAD WITHOUT CONTRAST MRA HEAD  WITHOUT CONTRAST TECHNIQUE: Multiplanar, multiecho pulse sequences of the brain and surrounding structures were obtained without intravenous contrast. Angiographic images of the head were obtained using MRA technique  without contrast. COMPARISON:  CT HEAD November 14, 2015 at 1920 hours and MRI of the brain December 16, 2014 and MRA head August 23, 2014. FINDINGS: MRI HEAD FINDINGS INTRACRANIAL CONTENTS: 1 cm rounded area of reduced diffusion RIGHT corona radiata. 1 cm reduced diffusion RIGHT frontal periventricular white matter/ body corpus callosum. Both areas demonstrate low ADC values. No susceptibility artifact to suggest hemorrhage. The ventricles and sulci are mildly prominent for patient's age. Patchy to confluent supratentorial and pontine white matter FLAIR T2 hyperintensities. Small area LEFT mesial occipital lobe encephalomalacia. No suspicious parenchymal signal, masses or mass effect. No abnormal extra-axial fluid collections. No extra-axial masses though, contrast enhanced sequences would be more sensitive. Normal major intracranial vascular flow voids present at skull base. ORBITS: The included ocular globes and orbital contents are non-suspicious. Status post bilateral ocular lens implants. SINUSES: The mastoid air-cells and included paranasal sinuses are well-aerated. Known LEFT internal auditory canal schwannoma not conspicuous by noncontrast examination. SKULL/SOFT TISSUES: No abnormal sellar expansion. No suspicious calvarial bone marrow signal. Craniocervical junction maintained. MRA HEAD FINDINGS ANTERIOR CIRCULATION: Normal flow related enhancement of the included cervical, petrous, cavernous and supraclinoid internal carotid arteries. 2 mm laterally directed wide necked aneurysm RIGHT cavernous internal carotid artery is better seen on this high field strength examination. Patent anterior communicating artery. Normal flow related enhancement of the anterior and middle cerebral arteries, including  distal segments. No large vessel occlusion, high-grade stenosis. Moderate luminal regularity of the middle cerebral arteries. POSTERIOR CIRCULATION: Codominant vertebral artery's. Basilar artery is patent, with normal flow related enhancement of the main branch vessels. Normal flow related enhancement of the posterior cerebral arteries. Moderate stenosis proximal RIGHT P3 segment. Moderate stenosis LEFT P3. Robust RIGHT posterior communicating artery. Luminal irregularity of the posterior cerebral arteries. No large vessel occlusion, aneurysm. IMPRESSION: MRI HEAD: Acute 1 cm RIGHT corona radiata and RIGHT frontal periventricular white matter infarcts. Stable moderate global brain atrophy and moderate to severe chronic small vessel ischemic disease. 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 MRA HEAD: No emergent large vessel occlusion. 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 Moderate stenoses of the posterior cerebral arteries and general luminal irregularity of the intracranial vessels compatible with atherosclerosis. Electronically Signed   By: Elon Alas M.D.   On: 11/14/2015 22:56     Microbiology: No results found for this or any previous visit (from the past 240 hour(s)).   Labs: Basic Metabolic Panel:  Recent Labs Lab 11/14/15 1854 11/14/15 1913 11/16/15 0818  NA 137 139 139  K 4.1 4.1 4.0  CL 107 108 106  CO2 22  --  25  GLUCOSE 94 90 104*  BUN 15 17 10   CREATININE 0.73 0.60 0.65  CALCIUM 9.4  --  9.3   Liver Function Tests:  Recent Labs Lab 11/14/15 1854  AST 31  ALT 20  ALKPHOS 75  BILITOT 0.5  PROT 7.4  ALBUMIN 3.6   No results for input(s): LIPASE, AMYLASE in the last 168 hours. No results for input(s): AMMONIA in the last 168 hours. CBC:  Recent Labs Lab 11/14/15 1854 11/14/15 1913  WBC 5.0  --   NEUTROABS 3.3  --   HGB 15.0 15.3*  HCT 45.9 45.0  MCV 90.0  --   PLT 198  --    Cardiac Enzymes: No results for input(s): CKTOTAL, CKMB,  CKMBINDEX, TROPONINI in the last 168 hours. BNP: Invalid input(s): POCBNP CBG:  Recent Labs Lab 11/15/15 0725 11/15/15 1144 11/15/15 1624 11/15/15 2146 11/16/15  [place]    Time coordinating discharge:  Greater than 30 minutes  Signed:  Orva Riles, DO [facility] Pager: [phone] 11/17/2015, 1:32 PM

## 2015-11-17 NOTE — Progress Notes (Signed)
Charlett Blake, MD Physician Signed Physical Medicine and Rehabilitation Consult Note 11/15/2015 10:49 AM  Related encounter: ED to Hosp-Admission (Current) from 11/14/2015 in [place] All Collapse All        Physical Medicine and Rehabilitation Consult Reason for Consult: Right corona radiata and right frontal periventricular white matter infarct Referring Physician: [facility]   HPI: Carmen Duncan is a 75 y.o. right handed female with history of hypertension, hyperlipidemia, breast cancer status post resection and radiation, essential tremor, remote stroke in the past without residual deficits maintained on Plavix. Patient lives with son and family. 2 level home with bedroom upstairs. Independent prior to admission. Presented 11/14/2015 a left-sided weakness. MRI of the brain showed acute 1 cm right corona radiata and right frontal periventricular white matter infarct. 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 MRA of the head with no emergent large vessel occlusion. Echocardiogram is pending. TEE also pending. Patient did not receive TPA. Neurology consulted presently remains on Plavix for CVA prophylaxis. Subcutaneous Lovenox for DVT prophylaxis. Physical and occupational therapy evaluation completed 11/15/2015 with recommendations of physical medicine rehabilitation consult  Patient has had Therapy evaluation. No functional deficits prior to admission.. Occasional short-term memory loss which is mild.  Review of Systems  Constitutional: Negative for fever and chills.  HENT: Negative for hearing loss.  Eyes: Negative for blurred vision and double vision.  Respiratory: Negative for cough and shortness of breath.  Cardiovascular: Negative for chest pain, palpitations and leg swelling.  Gastrointestinal: Positive for diarrhea and constipation. Negative for nausea and vomiting.   GERD  Genitourinary: Negative for dysuria and hematuria.    Musculoskeletal: Positive for myalgias and joint pain.  Skin: Negative for rash.  Neurological: Positive for tremors and weakness. Negative for seizures and headaches.  Psychiatric/Behavioral:   Anxiety  All other systems reviewed and are negative.  Past Medical History  Diagnosis Date  . Arthritis   . Asthma   . Hypertension   . GERD (gastroesophageal reflux disease)   . Hyperlipidemia   . Diverticulosis   . IBS (irritable bowel syndrome)   . HOH (hard of hearing) left ear  . Acoustic neuroma ([place])   . Ejection fraction   . Breast cancer of upper-outer quadrant of right female breast ([place]) 07/19/2015  . Brain cancer ([place])   . Anxiety   . Stroke [facility])     no deficits, on plavix  . Essential tremor     on inderal  . Pneumonia     04-2014, CAP  . Interstitial lung disease [facility])    Past Surgical History  Procedure Laterality Date  . Cholecystectomy  2011  . Abdominal hysterectomy  1980  . Tonsillectomy  1945  . Biopsy breast    . Eyes  2007    cararacts  . Tee without cardioversion N/A 09/05/2014    Procedure: TRANSESOPHAGEAL ECHOCARDIOGRAM (TEE); Surgeon: Lelon Perla, MD; Location: [facility] ENDOSCOPY; Service: Cardiovascular; Laterality: N/A;  . Breast lumpectomy with radioactive seed and sentinel lymph node biopsy Right 08/10/2015    Procedure: BREAST LUMPECTOMY WITH RADIOACTIVE SEED AND SENTINEL LYMPH NODE BIOPSY; Surgeon: Autumn Messing III, MD; Location: [place]; Service: General; Laterality: Right;   Family History  Problem Relation Age of Onset  . Hyperlipidemia Mother   . Hypertension Mother   . Hyperlipidemia Father   . Heart disease Father 60  . Stroke Father   . Lung cancer Paternal Uncle  x 4   Social History:  reports that she has never smoked. She has never used smokeless tobacco. She reports  that she drinks alcohol. She reports that she does not use illicit drugs. Allergies:  Allergies  Allergen Reactions  . Latex Itching, Rash and Other (See Comments)    Red angry skin from latex tape  . Sulfonamide Derivatives Rash    hives  . Tape Itching and Rash    Please use paper tape or coban wrap!!   Medications Prior to Admission  Medication Sig Dispense Refill  . acetaminophen (TYLENOL) 325 MG tablet Take 650 mg by mouth every 6 (six) hours as needed for headache.    [name] atorvastatin (LIPITOR) 20 MG tablet TAKE ONE TABLET BY MOUTH ONCE DAILY 30 tablet 11  . clidinium-chlordiazePOXIDE (LIBRAX) 5-2.5 MG capsule Take 1 capsule by mouth 2 (two) times daily as needed. 60 capsule 3  . clopidogrel (PLAVIX) 75 MG tablet Take 1 tablet (75 mg total) by mouth daily. (Patient taking differently: Take 75 mg by mouth at bedtime. ) 30 tablet 5  . Nintedanib ([name]) 100 MG CAPS Take 100 mg by mouth 2 (two) times daily.    . pantoprazole (PROTONIX) 40 MG tablet TAKE ONE TABLET BY MOUTH ONCE DAILY 30 tablet 5  . propranolol (INDERAL) 20 MG tablet Take 1 tablet (20 mg total) by mouth daily. 30 tablet 6    Home: Home Living Family/patient expects to be discharged to:: Private residence Living Arrangements: Children Available Help at Discharge: Family, Available PRN/intermittently Type of Home: House Home Access: Stairs to enter Technical brewer of Steps: 4-5 Entrance Stairs-Rails: Right Home Layout: Two level, Bed/bath upstairs Alternate Level Stairs-Number of Steps: 1 flight Alternate Level Stairs-Rails: Right Bathroom Shower/Tub: Multimedia programmer: Handicapped height Home Equipment: Environmental consultant - 2 wheels, Shower seat - built in, [employer] - tub/shower  Functional History: Prior Function Level of Independence: Independent Comments: Cooks, cleans, drives. No falls reported. Functional Status:  Mobility: Bed  Mobility General bed mobility comments: Pt OOB in chair upon arrival. Transfers Overall transfer level: Needs assistance Equipment used: Rolling walker (2 wheeled) Transfers: Sit to/from Stand Sit to Stand: Min guard General transfer comment: Min guard for safety. VCs for placement of L hand on RW. Ambulation/Gait Ambulation/Gait assistance: Min assist Ambulation Distance (Feet): 100 Feet Assistive device: 1 person hand held assist Gait Pattern/deviations: Step-to pattern, Decreased dorsiflexion - left, Staggering left, Staggering right, Trunk flexed General Gait Details: Slow, unsteady gait with staggering left/right and decreased foot clearance LLE.  Gait velocity: decreased Gait velocity interpretation: Below normal speed for age/gender    ADL: ADL Overall ADL's : Needs assistance/impaired Eating/Feeding: Set up, Sitting Grooming: Minimal assistance, Standing, Oral care Upper Body Bathing: Minimal assitance, Sitting Lower Body Bathing: Minimal assistance, Sit to/from stand Upper Body Dressing : Minimal assistance, Sitting Lower Body Dressing: Minimal assistance, Sit to/from stand Toilet Transfer: Min guard, Ambulation, Cueing for sequencing, Cueing for safety, Comfort height toilet, RW Toilet Transfer Details (indicate cue type and reason): Pt running into things on her L side with functional mobility; VCs to correct. Toileting- Clothing Manipulation and Hygiene: Minimal assistance, Sit to/from stand Toileting - Clothing Manipulation Details (indicate cue type and reason): Assist to pull up L side of underwear. Functional mobility during ADLs: Min guard, Rolling walker, Cueing for safety, Cueing for sequencing General ADL Comments: Pt required consistent VCs for L hand placement on RW with functional mobility. Pt noted to be running into objects on L side  during functional mobility in room; able to correct with VCs. Educated pt on proper positioning of L UE and continued use of  LUE during functional activities.  Cognition: Cognition Overall Cognitive Status: Impaired/Different from baseline Orientation Level: Oriented X4 Cognition Arousal/Alertness: Awake/alert Behavior During Therapy: WFL for tasks assessed/performed Overall Cognitive Status: Impaired/Different from baseline Area of Impairment: Memory, Safety/judgement Memory: Decreased short-term memory Safety/Judgement: Decreased awareness of safety, Decreased awareness of deficits  Blood pressure 172/57, pulse 76, temperature 97.7 F (36.5 C), temperature source Oral, resp. rate 20, SpO2 98 %. Physical Exam  Constitutional: She is oriented to person, place, and time. She appears well-developed.  HENT:  Head: Normocephalic.  Eyes: EOM are normal.  Neck: Normal range of motion. Neck supple. No thyromegaly present.  Cardiovascular: Normal rate and regular rhythm.  Respiratory: Effort normal and breath sounds normal. No respiratory distress.  GI: Soft. Bowel sounds are normal. She exhibits no distension.  Neurological: She is alert and oriented to person, place, and time.  Follows full commands. Good awareness of deficits.  Skin: Skin is warm and dry.  5/5 strength in the right deltoid, biceps, triceps, grip, hip flexor, knee extensor, ankle dorsi flexor plantar flexion 4/5 in the left deltoid, biceps, triceps, grip, hip flexor, knee extensor, ankle dorsi flexor plantar flexor. Sensation intact   Lab Results Last 24 Hours    Results for orders placed or performed during the hospital encounter of 11/14/15 (from the past 24 hour(s))  CBG monitoring, ED Status: None   Collection Time: 11/14/15 6:45 PM  Result Value Ref Range   Glucose-Capillary 92 65 - 99 mg/dL  Protime-INR Status: None   Collection Time: 11/14/15 6:54 PM  Result Value Ref Range   Prothrombin Time 12.8 11.6 - 15.2 seconds   INR 0.94 0.00 - 1.49  APTT Status: None   Collection Time:  11/14/15 6:54 PM  Result Value Ref Range   aPTT 26 24 - 37 seconds  CBC Status: None   Collection Time: 11/14/15 6:54 PM  Result Value Ref Range   WBC 5.0 4.0 - 10.5 K/uL   RBC 5.10 3.87 - 5.11 MIL/uL   Hemoglobin 15.0 12.0 - 15.0 g/dL   HCT 45.9 36.0 - 46.0 %   MCV 90.0 78.0 - 100.0 fL   MCH 29.4 26.0 - 34.0 pg   MCHC 32.7 30.0 - 36.0 g/dL   RDW 14.1 11.5 - 15.5 %   Platelets 198 150 - 400 K/uL  Differential Status: None   Collection Time: 11/14/15 6:54 PM  Result Value Ref Range   Neutrophils Relative % 64 %   Neutro Abs 3.3 1.7 - 7.7 K/uL   Lymphocytes Relative 19 %   Lymphs Abs 0.9 0.7 - 4.0 K/uL   Monocytes Relative 10 %   Monocytes Absolute 0.5 0.1 - 1.0 K/uL   Eosinophils Relative 6 %   Eosinophils Absolute 0.3 0.0 - 0.7 K/uL   Basophils Relative 1 %   Basophils Absolute 0.0 0.0 - 0.1 K/uL  Comprehensive metabolic panel Status: None   Collection Time: 11/14/15 6:54 PM  Result Value Ref Range   Sodium 137 135 - 145 mmol/L   Potassium 4.1 3.5 - 5.1 mmol/L   Chloride 107 101 - 111 mmol/L   CO2 22 22 - 32 mmol/L   Glucose, Bld 94 65 - 99 mg/dL   BUN 15 6 - 20 mg/dL   Creatinine, Ser 0.73 0.44 - 1.00 mg/dL   Calcium 9.4 8.9 - 10.3 mg/dL  Total Protein 7.4 6.5 - 8.1 g/dL   Albumin 3.6 3.5 - 5.0 g/dL   AST 31 15 - 41 U/L   ALT 20 14 - 54 U/L   Alkaline Phosphatase 75 38 - 126 U/L   Total Bilirubin 0.5 0.3 - 1.2 mg/dL   GFR calc non Af Amer >60 >60 mL/min   GFR calc Af Amer >60 >60 mL/min   Anion gap 8 5 - 15  I-stat troponin, ED Status: None   Collection Time: 11/14/15 7:11 PM  Result Value Ref Range   Troponin i, poc 0.00 0.00 - 0.08 ng/mL   Comment 3     I-Stat Chem 8, ED Status: Abnormal   Collection Time: 11/14/15 7:13 PM  Result Value Ref  Range   Sodium 139 135 - 145 mmol/L   Potassium 4.1 3.5 - 5.1 mmol/L   Chloride 108 101 - 111 mmol/L   BUN 17 6 - 20 mg/dL   Creatinine, Ser 0.60 0.44 - 1.00 mg/dL   Glucose, Bld 90 65 - 99 mg/dL   Calcium, Ion 1.08 (L) 1.13 - 1.30 mmol/L   TCO2 23 0 - 100 mmol/L   Hemoglobin 15.3 (H) 12.0 - 15.0 g/dL   HCT 45.0 36.0 - 46.0 %  Urinalysis, Routine w reflex microscopic (not at [place]) *Canceled* Status: None ()   Collection Time: 11/14/15 9:00 PM   Narrative   LIS Cancel (ORR/DE = Data Error)  Urinalysis, Routine w reflex microscopic (not at [facility]) Status: Abnormal   Collection Time: 11/14/15 9:35 PM  Result Value Ref Range   Color, Urine YELLOW YELLOW   APPearance CLEAR CLEAR   Specific Gravity, Urine 1.016 1.005 - 1.030   pH 5.5 5.0 - 8.0   Glucose, UA NEGATIVE NEGATIVE mg/dL   Hgb urine dipstick NEGATIVE NEGATIVE   Bilirubin Urine NEGATIVE NEGATIVE   Ketones, ur NEGATIVE NEGATIVE mg/dL   Protein, ur NEGATIVE NEGATIVE mg/dL   Nitrite NEGATIVE NEGATIVE   Leukocytes, UA TRACE (A) NEGATIVE  Urine microscopic-add on Status: Abnormal   Collection Time: 11/14/15 9:35 PM  Result Value Ref Range   Squamous Epithelial / LPF 0-5 (A) NONE SEEN   WBC, UA 0-5 0 - 5 WBC/hpf   RBC / HPF 0-5 0 - 5 RBC/hpf   Bacteria, UA FEW (A) NONE SEEN  Glucose, capillary Status: Abnormal   Collection Time: 11/15/15 12:20 AM  Result Value Ref Range   Glucose-Capillary 105 (H) 65 - 99 mg/dL  Lipid panel Status: Abnormal   Collection Time: 11/15/15 5:16 AM  Result Value Ref Range   Cholesterol 132 0 - 200 mg/dL   Triglycerides 166 (H) <150 mg/dL   HDL 35 (L) >40 mg/dL   Total CHOL/HDL Ratio 3.8 RATIO   VLDL 33 0 - 40 mg/dL   LDL Cholesterol 64 0 - 99 mg/dL  Glucose, capillary Status: None   Collection Time:  11/15/15 7:25 AM  Result Value Ref Range   Glucose-Capillary 90 65 - 99 mg/dL   Comment 1 Notify RN    Comment 2 Document in Chart       Imaging Results (Last 48 hours)    Dg Chest 2 View  11/14/2015 CLINICAL DATA: CEREBRUM , stroke EXAM: CHEST - 2 VIEW COMPARISON: CT 09/04/2015 and previous FINDINGS: Coarse linear scarring or subsegmental atelectasis in the lung bases, improved since previous radiograph of 06/08/2014. No confluent airspace disease or overt edema. Heart size upper limits normal. Atheromatous aorta, mildly tortuous. No effusion. No pneumothorax. Surgical  clips right axilla. Anterior vertebral endplate spurring at multiple levels in the mid thoracic spine. IMPRESSION: 1. Coarse probably chronic bibasilar interstitial opacities. No definite acute or superimposed abnormality. 2. Aortic Atherosclerosis (ICD10-170.0) Electronically Signed By: Lucrezia Europe M.D. On: 11/14/2015 22:42   Ct Head Wo Contrast  11/14/2015 CLINICAL DATA: Acute onset of stroke-like symptoms. Initial encounter. EXAM: CT HEAD WITHOUT CONTRAST TECHNIQUE: Contiguous axial images were obtained from the base of the skull through the vertex without intravenous contrast. COMPARISON: MRI of the brain performed 12/16/2014 FINDINGS: There is no evidence of acute infarction, mass lesion, or intra- or extra-axial hemorrhage on CT. Prominence of the ventricles and sulci reflects mild cortical volume loss. Scattered periventricular and subcortical white matter change likely reflects small vessel ischemic microangiopathy. The brainstem and fourth ventricle are within normal limits. The basal ganglia are unremarkable in appearance. The cerebral hemispheres demonstrate grossly normal gray-white differentiation. No mass effect or midline shift is seen. There is no evidence of fracture; visualized osseous structures are unremarkable in appearance. The orbits are within normal limits. The paranasal sinuses and  mastoid air cells are well-aerated. No significant soft tissue abnormalities are seen. IMPRESSION: 1. No acute intracranial pathology seen on CT. 2. Mild cortical volume loss and scattered small vessel ischemic microangiopathy. Electronically Signed By: Garald Balding M.D. On: 11/14/2015 19:46   Mr Angiogram Head Wo Contrast  11/14/2015 CLINICAL DATA: New onset LEFT-sided weakness, possibly beginning yesterday. LEFT facial droop today. History of hypertension, hyperlipidemia, stroke, breast cancer. EXAM: MRI HEAD WITHOUT CONTRAST MRA HEAD WITHOUT CONTRAST TECHNIQUE: Multiplanar, multiecho pulse sequences of the brain and surrounding structures were obtained without intravenous contrast. Angiographic images of the head were obtained using MRA technique without contrast. COMPARISON: CT HEAD November 14, 2015 at 1920 hours and MRI of the brain December 16, 2014 and MRA head August 23, 2014. FINDINGS: MRI HEAD FINDINGS INTRACRANIAL CONTENTS: 1 cm rounded area of reduced diffusion RIGHT corona radiata. 1 cm reduced diffusion RIGHT frontal periventricular white matter/ body corpus callosum. Both areas demonstrate low ADC values. No susceptibility artifact to suggest hemorrhage. The ventricles and sulci are mildly prominent for patient's age. Patchy to confluent supratentorial and pontine white matter FLAIR T2 hyperintensities. Small area LEFT mesial occipital lobe encephalomalacia. No suspicious parenchymal signal, masses or mass effect. No abnormal extra-axial fluid collections. No extra-axial masses though, contrast enhanced sequences would be more sensitive. Normal major intracranial vascular flow voids present at skull base. ORBITS: The included ocular globes and orbital contents are non-suspicious. Status post bilateral ocular lens implants. SINUSES: The mastoid air-cells and included paranasal sinuses are well-aerated. Known LEFT internal auditory canal schwannoma not conspicuous by noncontrast examination.  SKULL/SOFT TISSUES: No abnormal sellar expansion. No suspicious calvarial bone marrow signal. Craniocervical junction maintained. MRA HEAD FINDINGS ANTERIOR CIRCULATION: Normal flow related enhancement of the included cervical, petrous, cavernous and supraclinoid internal carotid arteries. 2 mm laterally directed wide necked aneurysm RIGHT cavernous internal carotid artery is better seen on this high field strength examination. Patent anterior communicating artery. Normal flow related enhancement of the anterior and middle cerebral arteries, including distal segments. No large vessel occlusion, high-grade stenosis. Moderate luminal regularity of the middle cerebral arteries. POSTERIOR CIRCULATION: Codominant vertebral artery's. Basilar artery is patent, with normal flow related enhancement of the main branch vessels. Normal flow related enhancement of the posterior cerebral arteries. Moderate stenosis proximal RIGHT P3 segment. Moderate stenosis LEFT P3. Robust RIGHT posterior communicating artery. Luminal irregularity of the posterior cerebral arteries. No large vessel occlusion, aneurysm. IMPRESSION:  MRI HEAD: Acute 1 cm RIGHT corona radiata and RIGHT frontal periventricular white matter infarcts. Stable moderate global brain atrophy and moderate to severe chronic small vessel ischemic disease. 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 MRA HEAD: No emergent large vessel occlusion. 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 Moderate stenoses of the posterior cerebral arteries and general luminal irregularity of the intracranial vessels compatible with atherosclerosis. Electronically Signed By: Elon Alas M.D. On: 11/14/2015 22:56   Mr Brain Wo Contrast  11/14/2015 CLINICAL DATA: New onset LEFT-sided weakness, possibly beginning yesterday. LEFT facial droop today. History of hypertension, hyperlipidemia, stroke, breast cancer. EXAM: MRI HEAD WITHOUT CONTRAST MRA HEAD WITHOUT CONTRAST  TECHNIQUE: Multiplanar, multiecho pulse sequences of the brain and surrounding structures were obtained without intravenous contrast. Angiographic images of the head were obtained using MRA technique without contrast. COMPARISON: CT HEAD November 14, 2015 at 1920 hours and MRI of the brain December 16, 2014 and MRA head August 23, 2014. FINDINGS: MRI HEAD FINDINGS INTRACRANIAL CONTENTS: 1 cm rounded area of reduced diffusion RIGHT corona radiata. 1 cm reduced diffusion RIGHT frontal periventricular white matter/ body corpus callosum. Both areas demonstrate low ADC values. No susceptibility artifact to suggest hemorrhage. The ventricles and sulci are mildly prominent for patient's age. Patchy to confluent supratentorial and pontine white matter FLAIR T2 hyperintensities. Small area LEFT mesial occipital lobe encephalomalacia. No suspicious parenchymal signal, masses or mass effect. No abnormal extra-axial fluid collections. No extra-axial masses though, contrast enhanced sequences would be more sensitive. Normal major intracranial vascular flow voids present at skull base. ORBITS: The included ocular globes and orbital contents are non-suspicious. Status post bilateral ocular lens implants. SINUSES: The mastoid air-cells and included paranasal sinuses are well-aerated. Known LEFT internal auditory canal schwannoma not conspicuous by noncontrast examination. SKULL/SOFT TISSUES: No abnormal sellar expansion. No suspicious calvarial bone marrow signal. Craniocervical junction maintained. MRA HEAD FINDINGS ANTERIOR CIRCULATION: Normal flow related enhancement of the included cervical, petrous, cavernous and supraclinoid internal carotid arteries. 2 mm laterally directed wide necked aneurysm RIGHT cavernous internal carotid artery is better seen on this high field strength examination. Patent anterior communicating artery. Normal flow related enhancement of the anterior and middle cerebral arteries, including distal segments. No  large vessel occlusion, high-grade stenosis. Moderate luminal regularity of the middle cerebral arteries. POSTERIOR CIRCULATION: Codominant vertebral artery's. Basilar artery is patent, with normal flow related enhancement of the main branch vessels. Normal flow related enhancement of the posterior cerebral arteries. Moderate stenosis proximal RIGHT P3 segment. Moderate stenosis LEFT P3. Robust RIGHT posterior communicating artery. Luminal irregularity of the posterior cerebral arteries. No large vessel occlusion, aneurysm. IMPRESSION: MRI HEAD: Acute 1 cm RIGHT corona radiata and RIGHT frontal periventricular white matter infarcts. Stable moderate global brain atrophy and moderate to severe chronic small vessel ischemic disease. 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 MRA HEAD: No emergent large vessel occlusion. 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 Moderate stenoses of the posterior cerebral arteries and general luminal irregularity of the intracranial vessels compatible with atherosclerosis. Electronically Signed By: Elon Alas M.D. On: 11/14/2015 22:56     Assessment/Plan: Diagnosis: Right corona radiata and lenticulostriate infarct causing left hemiparesis 1. Does the need for close, 24 hr/day medical supervision in concert with the patient's rehab needs make it unreasonable for this patient to be served in a less intensive setting? Yes 2. Co-Morbidities requiring supervision/potential complications: Hypertension, hyperlipidemia 3. Due to bladder management, bowel management, safety, skin/wound care, disease management, medication administration, pain management and patient education,  does the patient require 24 hr/day rehab nursing? Yes 4. Does the patient require coordinated care of a physician, rehab nurse, PT (1-2 hrs/day, 5 days/week), OT (1-2 hrs/day, 5 days/week) and SLP (.5-1 hrs/day, 5 days/week) to address physical and functional deficits in the context of the  above medical diagnosis(es)? Yes Addressing deficits in the following areas: balance, endurance, locomotion, strength, transferring, bowel/bladder control, bathing, dressing, feeding, grooming, toileting, cognition and speech 5. Can the patient actively participate in an intensive therapy program of at least 3 hrs of therapy per day at least 5 days per week? Yes 6. The potential for patient to make measurable gains while on inpatient rehab is good 7. Anticipated functional outcomes upon discharge from inpatient rehab are modified independent with PT, modified independent with OT, modified independent with SLP. 8. Estimated rehab length of stay to reach the above functional goals is: 7d 9. Does the patient have adequate social supports and living environment to accommodate these discharge functional goals? Yes 10. Anticipated D/C setting: Home 11. Anticipated post D/C treatments: [place] therapy 12. Overall Rehab/Functional Prognosis: excellent  RECOMMENDATIONS: This patient's condition is appropriate for continued rehabilitative care in the following setting: CIR if still requiring some physical assistance after workup is complete Patient has agreed to participate in recommended program. Yes Note that insurance prior authorization may be required for reimbursement for recommended care.  Comment:     11/15/2015       Revision History     Date/Time User Provider Type Action   11/15/2015 12:19 PM Charlett Blake, MD Physician Sign   11/15/2015 11:00 AM Cathlyn Parsons, PA-C Physician Assistant [place]   View Details Report       Routing History     Date/Time From To Method   11/15/2015 12:19 PM Charlett Blake, MD [name], MD In Basket

## 2015-11-17 NOTE — Clinical Social Work Note (Signed)
CSW was informed that patient has been accepted to inpatient rehab for today, CSW to sign off please reconsult if other social work needs arise.  [name]. [place], MSW, [place] 11/17/2015 1:30 PM

## 2015-11-17 NOTE — Progress Notes (Signed)
Patient transferred to 4w11

## 2015-11-17 NOTE — Progress Notes (Signed)
CM following for DCP; awaiting for insurance authorization for Inpatient RehabAneta Mins [id number]

## 2015-11-17 NOTE — Interval H&P Note (Signed)
Carmen Duncan was admitted today to Inpatient Rehabilitation with the diagnosis of CVA.  The patient's history has been reviewed, patient examined, and there is no change in status.  Patient continues to be appropriate for intensive inpatient rehabilitation.  I have reviewed the patient's chart and labs.  Questions were answered to the patient's satisfaction. The PAPE has been reviewed and assessment remains appropriate.  Carmen Duncan 11/17/2015, 6:38 PM

## 2015-11-17 NOTE — Telephone Encounter (Signed)
Dr. Elsworth Soho, spoke with Carmen Duncan this morning from [facility] regarding [place].  She said that we could get it approved quicker if you contact the number on this paperwork and do a Peer to peer. Please advise what you would like to do?

## 2015-11-17 NOTE — Care Management Important Message (Signed)
Important Message  Patient Details  Name: Carmen Duncan MRN: [id number] Date of Birth: 1941-03-06   Medicare Important Message Given:  Yes    [name] 11/17/2015, 9:49 AM

## 2015-11-17 NOTE — Progress Notes (Signed)
Inpatient Rehabilitation  Met with patient to notify of insurance authorization for IP Rehab admission today.  Plan to meet with patient and her son at 2pm to review IP Rehab admission paperwork.  Please call with questions.   , M.A., CCC/SLP Admission Coordinator  [facility]  Cell [phone]  

## 2015-11-17 NOTE — PMR Pre-admission (Signed)
PMR Admission Coordinator Pre-Admission Assessment  Patient: Carmen Duncan is an 75 y.o., female MRN: [id number] DOB: 24-Apr-1941 Height: 5\' 1"  Weight: 127 lbs.              Insurance Information HMO: X    PPO:      PCP:      IPA:      80/20:      OTHER:  PRIMARY: BCBS Medicare       Policy#: [id number]      Subscriber: Self CM Name: Pieter Partridge      Phone#: [phone]     Fax#: [phone] Pre-Cert#:              Employer: Retired  Benefits:  Phone #: [phone]     Name: Kristie Cowman. Date: 05/28/15     Deduct: $0      Out of Pocket Max: $6,700      Life Max: None CIR: $300 a day, days 1-6 then $0      SNF: $0 for days 1-20, then $154.50 days 21-100 Outpatient: OT/PT/SLP     Co-Pay: $40 Home Health: OT/PT/SLP     Co-Pay: $0 DME: prior approval if greater than $600     Co-Pay: 2% co-insurance  Providers: in-network  Medicaid Application Date:       Case Manager:  Disability Application Date:       Case Worker:   Emergency Facilities manager Information    Name Relation Home Work Mobile   Camp,Mitch Son [phone] [place] Relative [phone]  [phone]     Current Medical History  Patient Admitting Diagnosis: 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 right handed female with history of hypertension, hyperlipidemia, breast cancer status post resection and radiation, essential tremor, remote stroke in the past without residual deficits maintained on Plavix. Patient lives with son and family. 2 level home with bedroom upstairs. Independent prior to admission. Presented 11/14/2015 a left-sided weakness. MRI of the brain showed acute 1 cm right corona radiata and right frontal periventricular white matter infarct. 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 MRA of the head with no emergent large vessel occlusion. TEE with EF 55-60 % no thrombus and Loop recorder placed  11/16/2015. [name] Patient did not receive TPA. Neurology consulted presently remains on Plavix for CVA prophylaxis. Subcutaneous Lovenox for DVT prophylaxis. Physical and occupational therapy evaluation completed 11/15/2015 with recommendations of physical medicine rehabilitation consult. Patient was admitted for comprehensive rehabilitation program.  NIH Total: 1    Past Medical History  Past Medical History  Diagnosis Date  . Arthritis   . Asthma   . Hypertension   . GERD (gastroesophageal reflux disease)   . Hyperlipidemia   . Diverticulosis   . IBS (irritable bowel syndrome)   . HOH (hard of hearing) left ear  . Acoustic neuroma ([place])   . Ejection fraction   . Breast cancer of upper-outer quadrant of right female breast ([place]) 07/19/2015  . Brain cancer ([place])   . Anxiety   . Stroke [facility])     no deficits, on plavix  . Essential tremor     on inderal  . Pneumonia     04-2014, CAP  . Interstitial lung disease ([place])     Family History  family history includes Heart disease (age of onset: 45) in her father; Hyperlipidemia in her father and mother; Hypertension in her mother; Lung cancer  in her paternal uncle; Stroke in her father.  Prior Rehab/Hospitalizations:  Has the patient had major surgery during 100 days prior to admission? No  Current Medications   Current facility-administered medications:  .  acetaminophen (TYLENOL) tablet 650 mg, 650 mg, Oral, Q6H PRN, Vianne Bulls, MD, 650 mg at 11/17/15 1023 .  atorvastatin (LIPITOR) tablet 20 mg, 20 mg, Oral, Daily, Ilene Qua Opyd, MD, 20 mg at 11/17/15 0912 .  clidinium-chlordiazePOXIDE (LIBRAX) 2.5-5 mg per capsule, 1 capsule, Oral, BID PRN, Ilene Qua Opyd, MD .  clopidogrel (PLAVIX) tablet 75 mg, 75 mg, Oral, Daily, Vianne Bulls, MD, 75 mg at 11/17/15 0912 .  enoxaparin (LOVENOX) injection 40 mg, 40 mg, Subcutaneous, Q24H, Ilene Qua Opyd, MD, 40 mg at 11/17/15 0912 .  Nintedanib ([name]) CAPS 100 mg, 100 mg, Oral, BID, Ilene Qua  Opyd, MD, 100 mg at 11/16/15 1321 .  pantoprazole (PROTONIX) EC tablet 40 mg, 40 mg, Oral, Daily, Ilene Qua Opyd, MD, 40 mg at 11/17/15 0912 .  propranolol (INDERAL) tablet 20 mg, 20 mg, Oral, Daily, Orson Eva, MD, 20 mg at 11/17/15 0912 .  senna-docusate (Senokot-S) tablet 1 tablet, 1 tablet, Oral, QHS PRN, Vianne Bulls, MD  Patients Current Diet: Diet Heart Room service appropriate?: Yes; Fluid consistency:: Thin Diet - low sodium heart healthy  Precautions / Restrictions Precautions Precautions: Fall Restrictions Weight Bearing Restrictions: No   Has the patient had 2 or more falls or a fall with injury in the past year?Yes, fell a day prior to admission which resulted in left arm bruise  Prior Activity Level Community (5-7x/wk): Patient was independent, driving, and out in the community daily prior to admission.    Home Assistive Devices / Equipment Home Equipment: Environmental consultant - 2 wheels, Shower seat - built in, [employer] - tub/shower  Prior Device Use: Indicate devices/aids used by the patient prior to current illness, exacerbation or injury? None of the above  Prior Functional Level Prior Function Level of Independence: Independent Comments: Cooks, cleans, drives. No falls reported.  Self Care: Did the patient need help bathing, dressing, using the toilet or eating? Independent  Indoor Mobility: Did the patient need assistance with walking from room to room (with or without device)? Independent  Stairs: Did the patient need assistance with internal or external stairs (with or without device)? Independent  Functional Cognition: Did the patient need help planning regular tasks such as shopping or remembering to take medications? Independent  Current Functional Level Cognition  Arousal/Alertness: Awake/alert Overall Cognitive Status: Impaired/Different from baseline Orientation Level: Oriented X4 Safety/Judgement: Decreased awareness of safety, Decreased awareness of  deficits Attention: Selective Selective Attention: Appears intact Memory: Impaired Memory Impairment: Decreased recall of new information, Decreased short term memory Decreased Short Term Memory: Verbal basic Awareness: Appears intact Safety/Judgment: Appears intact    Extremity Assessment (includes Sensation/Coordination)  Upper Extremity Assessment: LUE deficits/detail LUE Deficits / Details: Overall 3/5 strength throughout LUE. Decreased shoulder AROM, full PROM. Pt reports pain with shoulder PROM. Pt report distal sensation changes. LUE: Unable to fully assess due to pain LUE Sensation: decreased light touch LUE Coordination: decreased fine motor, decreased gross motor  Lower Extremity Assessment: Defer to PT evaluation LLE Deficits / Details: Grossly ~3/5 throughout LLE LLE Sensation:  [facility].)    ADLs  Overall ADL's : Needs assistance/impaired Eating/Feeding: Set up, Sitting Grooming: Standing, Oral care, Brushing hair, Sitting, Applying deodorant, Min guard, Wash/dry face Grooming Details (indicate cue type and reason): Decreased L hand fine motor  coordination but attempting to use functionally.  Upper Body Bathing: Set up, Min guard, Sitting Upper Body Bathing Details (indicate cue type and reason): Cues for sitting upright. Pt with L lateral lean during functional activities. Lower Body Bathing: Minimal assistance, Sit to/from stand Lower Body Bathing Details (indicate cue type and reason): Assist for balance in standing. Upper Body Dressing : Set up, Min guard, Sitting Upper Body Dressing Details (indicate cue type and reason): to doff/don hopsital gown Lower Body Dressing: Minimal assistance, Sitting/lateral leans Lower Body Dressing Details (indicate cue type and reason): Assist to pull up L side of underwear. Pt able to doff/don socks and underwear with increased time. Toilet Transfer: Min guard, Ambulation, Cueing for sequencing, Cueing for safety, Comfort height toilet,  RW Toilet Transfer Details (indicate cue type and reason): Pt running into things on her L side with functional mobility; VCs to correct. Toileting- Clothing Manipulation and Hygiene: Minimal assistance, Sit to/from stand Toileting - Clothing Manipulation Details (indicate cue type and reason): Assist to pull up L side of underwear. Functional mobility during ADLs: Min guard, Rolling walker, Cueing for safety General ADL Comments: Pt continues to demonstrate L UE coordination deficits impacting her independence and safety with ADLs and functional mobility. Required VCs for keeping L hand on RW. Encouraged use of LUE during functional activities; pt able to return demo understanding.    Mobility  General bed mobility comments: Pt OOB in chair upon arrival.    Transfers  Overall transfer level: Needs assistance Equipment used: None Transfers: Sit to/from Stand Sit to Stand: Min guard General transfer comment: Min guard for safety. Multiple sit <> stands from chair during ADL.    Ambulation / Gait / Stairs / Wheelchair Mobility  Ambulation/Gait Ambulation/Gait assistance: Museum/gallery curator (Feet): 120 Feet Assistive device: Rolling walker (2 wheeled) Gait Pattern/deviations: Trendelenburg, Drifts right/left, Step-through pattern General Gait Details: facilitation for L hip stabilization in stance Gait velocity: decreased Gait velocity interpretation: Below normal speed for age/gender    Posture / Balance Dynamic Sitting Balance Sitting balance - Comments: L lateral lean when participating in functional activities. Able to correct with VCs. Balance Overall balance assessment: Needs assistance Sitting-balance support: Feet supported, No upper extremity supported Sitting balance-Leahy Scale: Fair Sitting balance - Comments: L lateral lean when participating in functional activities. Able to correct with VCs. Standing balance support: No upper extremity supported, During  functional activity Standing balance-Leahy Scale: Poor Standing balance comment: Able to brush teeth and wash face standing at sink with Min guard assist for safety and assist with setup. High Level Balance Comments: performed side stepping with UE support Standardized Balance Assessment Standardized Balance Assessment : Berg Balance Test Berg Balance Test Sit to Stand: Able to stand  independently using hands Standing Unsupported: Able to stand 2 minutes with supervision Sitting with Back Unsupported but Feet Supported on Floor or Stool: Able to sit safely and securely 2 minutes Stand to Sit: Controls descent by using hands Transfers: Needs one person to assist Standing Unsupported with Eyes Closed: Able to stand 10 seconds with supervision Standing Ubsupported with Feet Together: Needs help to attain position and unable to hold for 15 seconds From Standing, Reach Forward with Outstretched Arm: Can reach forward >12 cm safely (5") From Standing Position, Pick up Object from Floor: Able to pick up shoe, needs supervision From Standing Position, Turn to Look Behind Over each Shoulder: Needs supervision when turning Turn 360 Degrees: Needs assistance while turning Standing Unsupported, Alternately Place Feet on  Step/Stool: Able to complete >2 steps/needs minimal assist Standing Unsupported, One Foot in Front: Needs help to step but can hold 15 seconds Standing on One Leg: Unable to try or needs assist to prevent fall Total Score: 26    Special needs/care consideration BiPAP/CPAP: No CPM: No Continuous Drip IV: No Dialysis: No         Life Vest: No Oxygen: No Special Bed: No Trach Size: No Wound Vac (area): No       Skin: Bruise                              Location: Left arm Bowel mgmt: 11/16/15 Bladder mgmt: Urgency, could benefit from timed tolieting  Diabetic mgmt: No     Previous Home Environment Living Arrangements: Children  Lives With: Son Available Help at Discharge:  Family, Available PRN/intermittently Type of Home: House Home Layout: Two level, Bed/bath upstairs Alternate Level Stairs-Rails: Right Alternate Level Stairs-Number of Steps: 1 flight Home Access: Stairs to enter Entrance Stairs-Rails: Right Entrance Stairs-Number of Steps: 4-5 Bathroom Shower/Tub: Multimedia programmer: Handicapped height  Discharge Living Setting Plans for Discharge Living Setting: Lives with (comment) (son and daughter-in-law) Type of Home at Discharge: House Discharge Home Layout: Two level Alternate Level Stairs-Rails: Right Alternate Level Stairs-Number of Steps: 16 Discharge Home Access: Stairs to enter Entrance Stairs-Rails: Right Entrance Stairs-Number of Steps: 3 Discharge Bathroom Shower/Tub: Walk-in shower Discharge Bathroom Toilet: Handicapped height Discharge Bathroom Accessibility: No Does the patient have any problems obtaining your medications?: No  Social/Family/Support Systems Patient Roles: Parent, Other (Comment) (grandparent ) Anticipated Caregiver: Son and daughter-in-law Anticipated Caregiver's Contact Information: [name] and Lonzo Cloud [phone] Ability/Limitations of Caregiver: they work Building control surveyor Availability: Intermittent Discharge Plan Discussed with Primary Caregiver: Yes Is Caregiver In Agreement with Plan?: Yes Does Caregiver/Family have Issues with Lodging/Transportation while Pt is in Rehab?: No  Goals/Additional Needs Patient/Family Goal for Rehab: Mod I PT/OT/SLP Expected length of stay: about 7 days  Cultural Considerations: None Dietary Needs: None Equipment Needs: TBD Special Service Needs: None Additional Information: None Pt/Family Agrees to Admission and willing to participate: Yes Program Orientation Provided & Reviewed with Pt/Caregiver Including Roles  & Responsibilities: Yes Additional Information Needs: None Information Needs to be Provided By: N/A  Decrease burden of Care through IP rehab  admission: No  Possible need for SNF placement upon discharge: No  Patient Condition: This patient's medical and functional status has changed since the consult dated 11/15/15 in which the Rehabilitation Physician determined and documented that the patient was potentially appropriate for intensive rehabilitative care in an inpatient rehabilitation facility if therapy needs were still present after completion of medical work-up. Issues have been addressed and update has been discussed with Dr. Naaman Plummer and patient now appropriate for inpatient rehabilitation. Will admit to inpatient rehab today.   Preadmission Screen Completed By:  Gunnar Fusi, 11/17/2015 1:38 PM ______________________________________________________________________   Discussed status with Dr. Naaman Plummer on 11/17/15 at 1355 and received telephone approval for admission today.  Admission Coordinator:  Gunnar Fusi, time 1355/Date 11/17/15

## 2015-11-17 NOTE — Progress Notes (Signed)
Inpatient Rehabilitation  Met with patient and son who are in agreement with IP Rehab admission.  Will plan to admit patient later today.  Have notified Dr. Carles Collet, RN CM, [place] and bedside RN.  Please call with questions.  Carmelia Roller., CCC/SLP Admission Coordinator  [place]  Cell [phone]

## 2015-11-17 NOTE — H&P (View-Only) (Signed)
Physical Medicine and Rehabilitation Admission H&P    Chief Complaint  Patient presents with  . Stroke Symptoms  : HPI: Carmen Duncan is a 75 y.o. right handed female with history of hypertension, hyperlipidemia, breast cancer status post resection and radiation, essential tremor, remote stroke in the past without residual deficits maintained on Plavix. Patient lives with son and family. 2 level home with bedroom upstairs. Independent prior to admission. Presented 11/14/2015 a left-sided weakness. MRI of the brain showed acute 1 cm right corona radiata and right frontal periventricular white matter infarct. 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 MRA of the head with no emergent large vessel occlusion.  TEE with EF 55-60 % no thrombus and Loop recorder placed 11/16/2015. [name] Patient did not receive TPA. Neurology consulted presently remains on Plavix for CVA prophylaxis. Subcutaneous Lovenox for DVT prophylaxis. Physical and occupational therapy evaluation completed 11/15/2015 with recommendations of physical medicine rehabilitation consult.Patient was admitted for comprehensive rehabilitation program  ROS Constitutional: Negative for fever and chills.  HENT: Negative for hearing loss.  Eyes: Negative for blurred vision and double vision.  Respiratory: Negative for cough and shortness of breath.  Cardiovascular: Negative for chest pain, palpitations and leg swelling.  Gastrointestinal: Positive for diarrhea and constipation. Negative for nausea and vomiting.   GERD  Genitourinary: Negative for dysuria and hematuria.  Musculoskeletal: Positive for myalgias and joint pain.  Skin: Negative for rash.  Neurological: Positive for tremors and weakness. Negative for seizures and headaches.  Psychiatric/Behavioral:   Anxiety  All other systems reviewed and are negative   Past Medical History  Diagnosis Date  . Arthritis   . Asthma   . Hypertension   . GERD (gastroesophageal reflux disease)    . Hyperlipidemia   . Diverticulosis   . IBS (irritable bowel syndrome)   . HOH (hard of hearing) left ear  . Acoustic neuroma ([place])   . Ejection fraction   . Breast cancer of upper-outer quadrant of right female breast ([place]) 07/19/2015  . Brain cancer ([place])   . Anxiety   . Stroke [facility])     no deficits, on plavix  . Essential tremor     on inderal  . Pneumonia     04-2014, CAP  . Interstitial lung disease [facility])    Past Surgical History  Procedure Laterality Date  . Cholecystectomy  2011  . Abdominal hysterectomy  1980  . Tonsillectomy  1945  . Biopsy breast    . Eyes  2007    cararacts  . Tee without cardioversion N/A 09/05/2014    Procedure: TRANSESOPHAGEAL ECHOCARDIOGRAM (TEE);  Surgeon: Lelon Perla, MD;  Location: [facility] ENDOSCOPY;  Service: Cardiovascular;  Laterality: N/A;  . Breast lumpectomy with radioactive seed and sentinel lymph node biopsy Right 08/10/2015    Procedure: BREAST LUMPECTOMY WITH RADIOACTIVE SEED AND SENTINEL LYMPH NODE BIOPSY;  Surgeon: Autumn Messing III, MD;  Location: [place];  Service: General;  Laterality: Right;  . Ep implantable device N/A 11/16/2015    Procedure: Loop Recorder Insertion;  Surgeon: [name], MD;  Location: [place] CV LAB;  Service: Cardiovascular;  Laterality: N/A;  . Tee without cardioversion N/A 11/16/2015    Procedure: TRANSESOPHAGEAL ECHOCARDIOGRAM (TEE);  Surgeon: Larey Dresser, MD;  Location: [facility] ENDOSCOPY;  Service: Cardiovascular;  Laterality: N/A;   Family History  Problem Relation Age of Onset  . Hyperlipidemia Mother   . Hypertension Mother   . Hyperlipidemia Father   . Heart disease Father 5  .  Stroke Father   . Lung cancer Paternal Uncle     x 4   Social History:  reports that she has never smoked. She has never used smokeless tobacco. She reports that she drinks alcohol. She reports that she does not use illicit drugs. Allergies:  Allergies  Allergen Reactions  . Latex Itching, Rash and  Other (See Comments)    Red angry skin from latex tape  . Sulfonamide Derivatives Rash    hives  . Tape Itching and Rash    Please use paper tape or coban wrap!!   Medications Prior to Admission  Medication Sig Dispense Refill  . acetaminophen (TYLENOL) 325 MG tablet Take 650 mg by mouth every 6 (six) hours as needed for headache.    [name] atorvastatin (LIPITOR) 20 MG tablet TAKE ONE TABLET BY MOUTH ONCE DAILY 30 tablet 11  . clidinium-chlordiazePOXIDE (LIBRAX) 5-2.5 MG capsule Take 1 capsule by mouth 2 (two) times daily as needed. 60 capsule 3  . clopidogrel (PLAVIX) 75 MG tablet Take 1 tablet (75 mg total) by mouth daily. (Patient taking differently: Take 75 mg by mouth at bedtime. ) 30 tablet 5  . Nintedanib ([name]) 100 MG CAPS Take 100 mg by mouth 2 (two) times daily.    . pantoprazole (PROTONIX) 40 MG tablet TAKE ONE TABLET BY MOUTH ONCE DAILY 30 tablet 5  . propranolol (INDERAL) 20 MG tablet Take 1 tablet (20 mg total) by mouth daily. 30 tablet 6    Home: Home Living Family/patient expects to be discharged to:: Private residence Living Arrangements: Children Available Help at Discharge: Family, Available PRN/intermittently Type of Home: House Home Access: Stairs to enter Technical brewer of Steps: 4-5 Entrance Stairs-Rails: Right Home Layout: Two level, Bed/bath upstairs Alternate Level Stairs-Number of Steps: 1 flight Alternate Level Stairs-Rails: Right Bathroom Shower/Tub: Multimedia programmer: Handicapped height Home Equipment: Environmental consultant - 2 wheels, Shower seat - built in, [employer] - tub/shower  Lives With: Son   Functional History: Prior Function Level of Independence: Independent Comments: Cooks, cleans, drives. No falls reported.  Functional Status:  Mobility: Bed Mobility General bed mobility comments: Pt OOB in chair upon arrival. Transfers Overall transfer level: Needs assistance Equipment used: None Transfers: Sit to/from Stand Sit to Stand:  Min guard General transfer comment: Min guard for safety. Multiple sit <> stands from chair during ADL. Ambulation/Gait Ambulation/Gait assistance: Min assist Ambulation Distance (Feet): 120 Feet Assistive device: Rolling walker (2 wheeled) Gait Pattern/deviations: Trendelenburg, Drifts right/left, Step-through pattern General Gait Details: facilitation for L hip stabilization in stance Gait velocity: decreased Gait velocity interpretation: Below normal speed for age/gender    ADL: ADL Overall ADL's : Needs assistance/impaired Eating/Feeding: Set up, Sitting Grooming: Standing, Oral care, Brushing hair, Sitting, Applying deodorant, Min guard, Wash/dry face Grooming Details (indicate cue type and reason): Decreased L hand fine motor coordination but attempting to use functionally.  Upper Body Bathing: Set up, Min guard, Sitting Upper Body Bathing Details (indicate cue type and reason): Cues for sitting upright. Pt with L lateral lean during functional activities. Lower Body Bathing: Minimal assistance, Sit to/from stand Lower Body Bathing Details (indicate cue type and reason): Assist for balance in standing. Upper Body Dressing : Set up, Min guard, Sitting Upper Body Dressing Details (indicate cue type and reason): to doff/don hopsital gown Lower Body Dressing: Minimal assistance, Sitting/lateral leans Lower Body Dressing Details (indicate cue type and reason): Assist to pull up L side of underwear. Pt able to doff/don socks and underwear with  increased time. Toilet Transfer: Min guard, Ambulation, Cueing for sequencing, Cueing for safety, Comfort height toilet, RW Toilet Transfer Details (indicate cue type and reason): Pt running into things on her L side with functional mobility; VCs to correct. Toileting- Clothing Manipulation and Hygiene: Minimal assistance, Sit to/from stand Toileting - Clothing Manipulation Details (indicate cue type and reason): Assist to pull up L side of  underwear. Functional mobility during ADLs: Min guard, Rolling walker, Cueing for safety General ADL Comments: Pt continues to demonstrate L UE coordination deficits impacting her independence and safety with ADLs and functional mobility. Required VCs for keeping L hand on RW. Encouraged use of LUE during functional activities; pt able to return demo understanding.  Cognition: Cognition Overall Cognitive Status: Impaired/Different from baseline Arousal/Alertness: Awake/alert Orientation Level: Oriented X4 Attention: Selective Selective Attention: Appears intact Memory: Impaired Memory Impairment: Decreased recall of new information, Decreased short term memory Decreased Short Term Memory: Verbal basic Awareness: Appears intact Safety/Judgment: Appears intact Cognition Arousal/Alertness: Awake/alert Behavior During Therapy: WFL for tasks assessed/performed Overall Cognitive Status: Impaired/Different from baseline Area of Impairment: Memory, Safety/judgement Memory: Decreased short-term memory Safety/Judgement: Decreased awareness of safety, Decreased awareness of deficits  Physical Exam: Blood pressure 135/89, pulse 75, temperature 98 F (36.7 C), temperature source Oral, resp. rate 20, SpO2 97 %. Physical Exam Constitutional: She is oriented to person, place, and time. She appears well-developed.  HENT: oral mucosa pink and moist, dentition good Head: Normocephalic.  Eyes: EOM are normal.  Neck: Normal range of motion. Neck supple. No thyromegaly present.  Cardiovascular: Normal rate and regular rhythm. No murmurs, rubs, or gallops  Respiratory: Effort normal and breath sounds normal. No respiratory distress.  GI: Soft. Bowel sounds are normal. She exhibits no distension.  Neurological: She is alert and oriented to person, place, and time.  Follows full commands. Good awareness of deficits. Functional memory, insight, decision making. Language normal. Skin: Skin is warm and  dry.  5/5 strength in the right deltoid, biceps, triceps, grip, hip flexor, knee extensor, ankle dorsi flexor plantar flexion 4/5 in the left deltoid, biceps, triceps, grip, hip flexor, knee extensor, ankle dorsi flexor plantar flexor. Decreased FMC LUE and LLE without ataxia. No tremor. Minimal pronator drift. Sensation intact. DTR's 1+.  Psych: pleasant and appropriate.    Results for orders placed or performed during the hospital encounter of 11/14/15 (from the past 48 hour(s))  Glucose, capillary     Status: Abnormal   Collection Time: 11/15/15  9:46 PM  Result Value Ref Range   Glucose-Capillary 132 (H) 65 - 99 mg/dL   Comment 1 Notify RN    Comment 2 Document in Chart   Glucose, capillary     Status: Abnormal   Collection Time: 11/16/15  6:16 AM  Result Value Ref Range   Glucose-Capillary 108 (H) 65 - 99 mg/dL  Basic metabolic panel     Status: Abnormal   Collection Time: 11/16/15  8:18 AM  Result Value Ref Range   Sodium 139 135 - 145 mmol/L   Potassium 4.0 3.5 - 5.1 mmol/L   Chloride 106 101 - 111 mmol/L   CO2 25 22 - 32 mmol/L   Glucose, Bld 104 (H) 65 - 99 mg/dL   BUN 10 6 - 20 mg/dL   Creatinine, Ser 0.65 0.44 - 1.00 mg/dL   Calcium 9.3 8.9 - 10.3 mg/dL   GFR calc non Af Amer >60 >60 mL/min   GFR calc Af Amer >60 >60 mL/min    Comment: (NOTE)  The eGFR has been calculated using the CKD EPI equation. This calculation has not been validated in all clinical situations. eGFR's persistently <60 mL/min signify possible Chronic Kidney Disease.    Anion gap 8 5 - 15   No results found.     Medical Problem List and Plan: 1.  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  DVT Prophylaxis/Anticoagulation: Subcutaneous Lovenox. Monitor platelet counts and for any signs of bleeding 3. Pain Management: Tylenol as needed. No pain at this time 4. Hypertension. Permissive hypertension for  perfusion. Monitor BP with increased mobility 5. Neuropsych: This patient is capable of making decisions on her own behalf. 6. Skin/Wound Care: Routine skin checks 7. Fluids/Electrolytes/Nutrition: Routine I&O's with follow-up chemistries 8. Hyperlipidemia. Lipitor 9. Irritable bowel syndrome. Continue Librax 10. Essential tremor. Patient on Inderal 20 mg daily prior to admission. Resume as needed 11. Asthma. Continue [name] 100 mg twice a day  Post Admission Physician Evaluation: 1. Functional deficits secondary  to right corona radiata and lenticulostriate infarct. 2. Patient is admitted to receive collaborative, interdisciplinary care between the physiatrist, rehab nursing staff, and therapy team. 3. Patient's level of medical complexity and substantial therapy needs in context of that medical necessity cannot be provided at a lesser intensity of care such as a SNF. 4. Patient has experienced substantial functional loss from his/her baseline which was documented above under the "Functional History" and "Functional Status" headings.  Judging by the patient's diagnosis, physical exam, and functional history, the patient has potential for functional progress which will result in measurable gains while on inpatient rehab.  These gains will be of substantial and practical use upon discharge  in facilitating mobility and self-care at the household level. 5. Physiatrist will provide 24 hour management of medical needs as well as oversight of the therapy plan/treatment and provide guidance as appropriate regarding the interaction of the two. 6. 24 hour rehab nursing will assist with bladder management, bowel management, safety, skin/wound care, disease management, medication administration, pain management and patient education  and help integrate therapy concepts, techniques,education, etc. 7. PT will assess and treat for/with: Lower extremity strength, range of motion, stamina, balance, functional  mobility, safety, adaptive techniques and equipment, NMR, ego support, community reintegration.   Goals are: mod I. 8. OT will assess and treat for/with: ADL's, functional mobility, safety, upper extremity strength, adaptive techniques and equipment, NMR, education, ego support, community reintegration.   Goals are: mod I. Therapy may proceed with showering this patient. 9. SLP will assess and treat for/with: n/a.  Goals are: n/a. 10. Case Management and Social Worker will assess and treat for psychological issues and discharge planning. 11. Team conference will be held weekly to assess progress toward goals and to determine barriers to discharge. 12. Patient will receive at least 3 hours of therapy per day at least 5 days per week. 13. ELOS: 7-8 days       14. Prognosis:  excellent     Meredith Staggers, MD, [place] [facility]   11/17/2015

## 2015-11-17 NOTE — Care Management Note (Signed)
Case Management Note  Patient Details  Name: Carmen Duncan MRN: [id number] Date of Birth: [birth year]  Subjective/Objective:   Pt medically stable for dc today.                 Action/Plan: Plan dc to [facility] later today.    Expected Discharge Date:    11/17/15              Expected Discharge Plan:  [place]  In-House Referral:     Discharge planning Services  CM Consult  Post Acute Care Choice:    Choice offered to:     DME Arranged:    DME Agency:     HH Arranged:    HH Agency:     Status of Service:  Completed, signed off  If discussed at [employer], dates discussed:    Additional Comments:  Reinaldo Raddle, RN, BSN  Trauma/Neuro ICU Case Manager [phone]

## 2015-11-17 NOTE — Progress Notes (Signed)
Occupational Therapy Treatment Patient Details Name: Carmen Duncan MRN: [id number] DOB: [birth year] Today's Date: 11/17/2015    History of present illness Patient is a 74 y/o female wtih hx of HTN, asthma, PNA ,breast cancer s/p Resection and radiation, essential tremor, interstitial lung disease, and remote stroke without residual deficits presents with left sided weakness. MRI-Acute 1 cm RIGHT corona radiata and RIGHT frontal periventricular white matter infarcts.   OT comments  Pt making good progress toward OT goals today but continues to demonstrate LUE coordination and strength deficits impacting her independence and safety with ADLs and functional mobility. Pt able to complete UB/LB bathing and dressing and grooming activities with min guard overall but required light min assist at times. Pt noted to have L lateral lean in sitting when engaged in functional activities; pt able to correct with verbal cues. D/c plan remains appropriate. Will continue to follow acutely.   Follow Up Recommendations  CIR;Supervision/Assistance - 24 hour    Equipment Recommendations  Other (comment) (TBD at next venue)    Recommendations for Other Services      Precautions / Restrictions Precautions Precautions: Fall Restrictions Weight Bearing Restrictions: No       Mobility Bed Mobility               General bed mobility comments: Pt OOB in chair upon arrival.  Transfers Overall transfer level: Needs assistance Equipment used: None Transfers: Sit to/from Stand Sit to Stand: Min guard         General transfer comment: Min guard for safety. Multiple sit <> stands from chair during ADL.    Balance Overall balance assessment: Needs assistance Sitting-balance support: Feet supported;No upper extremity supported Sitting balance-Leahy Scale: Fair Sitting balance - Comments: L lateral lean when participating in functional activities. Able to correct with VCs.   Standing balance  support: No upper extremity supported;During functional activity Standing balance-Leahy Scale: Poor                     ADL Overall ADL's : Needs assistance/impaired Eating/Feeding: Set up;Sitting   Grooming: Standing;Oral care;Brushing hair;Sitting;Applying deodorant;Min guard;Wash/dry face Grooming Details (indicate cue type and reason): Decreased L hand fine motor coordination but attempting to use functionally.  Upper Body Bathing: Set up;Min guard;Sitting Upper Body Bathing Details (indicate cue type and reason): Cues for sitting upright. Pt with L lateral lean during functional activities. Lower Body Bathing: Minimal assistance;Sit to/from stand Lower Body Bathing Details (indicate cue type and reason): Assist for balance in standing. Upper Body Dressing : Set up;Min guard;Sitting Upper Body Dressing Details (indicate cue type and reason): to doff/don hopsital gown Lower Body Dressing: Minimal assistance;Sitting/lateral leans Lower Body Dressing Details (indicate cue type and reason): Assist to pull up L side of underwear. Pt able to doff/don socks and underwear with increased time.             Functional mobility during ADLs: Min guard;Rolling walker;Cueing for safety General ADL Comments: Pt continues to demonstrate L UE coordination deficits impacting her independence and safety with ADLs and functional mobility. Required VCs for keeping L hand on RW. Encouraged use of LUE during functional activities; pt able to return demo understanding.      Vision                     Perception     Praxis      Cognition   Behavior During Therapy: [facility] for tasks assessed/performed Overall Cognitive Status: Impaired/Different from  baseline Area of Impairment: Memory;Safety/judgement     Memory: Decreased short-term memory    Safety/Judgement: Decreased awareness of safety;Decreased awareness of deficits          Extremity/Trunk Assessment                Exercises     Shoulder Instructions       General Comments      Pertinent Vitals/ Pain       Pain Assessment: No/denies pain  Home Living                                          Prior Functioning/Environment              Frequency Min 2X/week     Progress Toward Goals  OT Goals(current goals can now be found in the care plan section)  Progress towards OT goals: Progressing toward goals  Acute Rehab OT Goals Patient Stated Goal: to go home and return to independence OT Goal Formulation: With patient  Plan Discharge plan remains appropriate    Co-evaluation                 End of Session Equipment Utilized During Treatment: Rolling walker   Activity Tolerance Patient tolerated treatment well   Patient Left in chair;with call bell/phone within reach;with chair alarm set   Nurse Communication Mobility status        Time: [id number] OT Time Calculation (min): 26 min  Charges: OT General Charges $OT Visit: 1 Procedure OT Treatments $Self Care/Home Management : 23-37 mins  Binnie Kand M.S., OTR/L Pager: [phone]  11/17/2015, 8:47 AM

## 2015-11-18 ENCOUNTER — Inpatient Hospital Stay (HOSPITAL_COMMUNITY): Payer: Medicare Other | Admitting: Physical Therapy

## 2015-11-18 ENCOUNTER — Inpatient Hospital Stay (HOSPITAL_COMMUNITY): Payer: Medicare Other | Admitting: Occupational Therapy

## 2015-11-18 NOTE — Progress Notes (Signed)
Occupational Therapy Session Note  Patient Details  Name: Carmen Duncan MRN: [id number] Date of Birth: Dec 07, 1940  Today's Date: 11/18/2015 OT Individual Time: 1420-1500 OT Individual Time Calculation (min): 40 min    Skilled Therapeutic Interventions/Progress Updates:   Pt worked on donning shoes at [employer] during session.  She demonstrated increased weightshift to the left but no LOB.  Therapist provided verbal cueing to correct sitting balance with pt demonstrating increased weightshift to the right.  She was unable to tie her shoes after donning them.  Pt ambulated to the sink for brush her hair with min assist.  Noted decreased left hip and knee stability.  Had pt attempt wheelchair mobility using BUEs to push wheelchair out of the room.  She demonstrated decreased efficiency with being able to push the wheelchair.  Mod assist needed to take a larger revolution and reposition her LUE further back for a larger push.  Provided foam pieces for pt to work on picking up and completing in-hand translation from palm to fingertips.  Instructed pt to continue working on this in her free time between therapy sessions.  Pt with noted increased difficulty with palm to finger translation but did better with finger tip to palm.  Pt left in wheelchair per request to continue working on activity.  Call button and phone in reach.    Therapy Documentation Precautions:  Precautions Precautions: Fall Restrictions Weight Bearing Restrictions: No  Pain: Pain Assessment Pain Assessment: No/denies pain ADL: See Function Navigator for Current Functional Status.   Therapy/Group: Individual Therapy  Winter Trefz OTR/L 11/18/2015, 4:19 PM

## 2015-11-18 NOTE — Evaluation (Signed)
Occupational Therapy Assessment and Plan  Patient Details  Name: Carmen Duncan MRN: [id number] Date of Birth: 04-12-1941  OT Diagnosis: abnormal posture, hemiplegia affecting non-dominant side and muscle weakness (generalized) Rehab Potential: Rehab Potential (ACUTE ONLY): Excellent ELOS: 7-9 days   Today's Date: 11/18/2015 OT Individual Time: 3299-2426 OT Individual Time Calculation (min): 61 min     Problem List:  Patient Active Problem List   Diagnosis Date Noted  . CVA (cerebral infarction) 11/17/2015  . Left hemiparesis ([place])   . Acute CVA (cerebrovascular accident) ([place]) 11/15/2015  . CVA (cerebral vascular accident) ([place]) 11/14/2015  . Left-sided muscle weakness 11/14/2015  . Left-sided weakness 11/14/2015  . Breast cancer of upper-outer quadrant of right female breast ([place]) 07/19/2015  . B12 deficiency 03/14/2015  . Bilateral carotid artery disease ([place]) 10/07/2014  . Pulmonary hypertension ([place]) 10/07/2014  . Ejection fraction   . Cryptogenic stroke ([place]) 10/04/2014  . Oral candidiasis 04/28/2014  . Chronic diastolic congestive heart failure ([place]) 04/28/2014  . IPF (idiopathic pulmonary fibrosis) ([place]) 04/21/2014  . Hyponatremia 04/19/2014  . Thrombocytopenia ([place]) 04/19/2014  . Malnutrition of moderate degree ([place]) 04/19/2014  . Hypertension 04/18/2014  . Hyperlipidemia 04/18/2014  . Hypovolemia due to dehydration 04/18/2014  . Dyspnea on exertion 04/04/2014  . Chest rales 04/04/2014  . Abnormal nuclear stress test 04/04/2014  . HOH (hard of hearing)   . Raynaud's phenomenon 03/21/2014  . Tremor, essential 03/12/2013  . Unspecified hereditary and idiopathic peripheral neuropathy 08/04/2012  . Squamous cell skin cancer 08/19/2011  . 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 Dyslipidemia 04/03/2010  . Essential hypertension 04/03/2010  . GERD 04/03/2010  . IBS 04/03/2010  . DEEP VENOUS THROMBOPHLEBITIS, HX OF 04/03/2010  . DIVERTICULITIS, HX OF 04/03/2010    Past  Medical History:  Past Medical History  Diagnosis Date  . Arthritis   . Asthma   . Hypertension   . GERD (gastroesophageal reflux disease)   . Hyperlipidemia   . Diverticulosis   . IBS (irritable bowel syndrome)   . HOH (hard of hearing) left ear  . Acoustic neuroma ([place])   . Ejection fraction   . Breast cancer of upper-outer quadrant of right female breast ([place]) 07/19/2015  . Brain cancer ([place])   . Anxiety   . Stroke [facility])     no deficits, on plavix  . Essential tremor     on inderal  . Pneumonia     04-2014, CAP  . Interstitial lung disease [facility])    Past Surgical History:  Past Surgical History  Procedure Laterality Date  . Cholecystectomy  2011  . Abdominal hysterectomy  1980  . Tonsillectomy  1945  . Biopsy breast    . Eyes  2007    cararacts  . Tee without cardioversion N/A 09/05/2014    Procedure: TRANSESOPHAGEAL ECHOCARDIOGRAM (TEE);  Surgeon: Lelon Perla, MD;  Location: [facility] ENDOSCOPY;  Service: Cardiovascular;  Laterality: N/A;  . Breast lumpectomy with radioactive seed and sentinel lymph node biopsy Right 08/10/2015    Procedure: BREAST LUMPECTOMY WITH RADIOACTIVE SEED AND SENTINEL LYMPH NODE BIOPSY;  Surgeon: Autumn Messing III, MD;  Location: [place];  Service: General;  Laterality: Right;  . Ep implantable device N/A 11/16/2015    Procedure: Loop Recorder Insertion;  Surgeon: [name], MD;  Location: [place] CV LAB;  Service: Cardiovascular;  Laterality: N/A;  . Tee without cardioversion N/A 11/16/2015    Procedure: TRANSESOPHAGEAL ECHOCARDIOGRAM (TEE);  Surgeon: Larey Dresser, MD;  Location: [place];  Service: Cardiovascular;  Laterality: N/A;    Assessment & Plan Clinical Impression: Patient is a 75 y.o. year old female with recent admission to the hospital on06/20/2017 a left-sided weakness. MRI of the brain showed acute 1 cm right corona radiata and right frontal periventricular white matter infarct. Old left PCA territory  infarct. MRA of the head with no emergent large vessel occlusion.  Patient transferred to CIR on 11/17/2015 .    Patient currently requires min with basic self-care skills secondary to muscle weakness, impaired timing and sequencing, unbalanced muscle activation and decreased coordination and decreased sitting balance, decreased standing balance, decreased postural control, hemiplegia and decreased balance strategies.  Prior to hospitalization, patient could complete ADLs and IADLs with independent .  Patient will benefit from skilled intervention to decrease level of assist with basic self-care skills, increase independence with basic self-care skills and increase level of independence with iADL prior to discharge home with care partner.  Anticipate patient will require 24 hour supervision and follow up outpatient.  OT - End of Session Activity Tolerance: Tolerates 30+ min activity with multiple rests Endurance Deficit: Yes OT Assessment Rehab Potential (ACUTE ONLY): Excellent OT Patient demonstrates impairments in the following area(s): Balance;Motor OT Basic ADL's Functional Problem(s): Eating;Grooming;Bathing;Dressing;Toileting OT Advanced ADL's Functional Problem(s): Laundry;Light Housekeeping;Simple Meal Preparation OT Transfers Functional Problem(s): Toilet;Tub/Shower OT Additional Impairment(s): Fuctional Use of Upper Extremity OT Plan OT Intensity: Minimum of 1-2 x/day, 45 to 90 minutes OT Frequency: 5 out of 7 days OT Duration/Estimated Length of Stay: 7-9 days OT Treatment/Interventions: Balance/vestibular training;Discharge planning;Functional mobility training;Therapeutic Activities;Psychosocial support;UE/LE Coordination activities;Patient/family education;Community reintegration;Cognitive remediation/compensation;DME/adaptive equipment instruction;Pain management;UE/LE Strength taining/ROM;Therapeutic Exercise;Self Care/advanced ADL retraining;Neuromuscular re-education;Disease  mangement/prevention OT Self Feeding Anticipated Outcome(s): modified independent OT Basic Self-Care Anticipated Outcome(s): modified independent OT Toileting Anticipated Outcome(s): modified independent OT Bathroom Transfers Anticipated Outcome(s): modified independent to supervision OT Recommendation Patient destination: Home Follow Up Recommendations: Outpatient OT Equipment Recommended: To be determined   Skilled Therapeutic Intervention Pt completed bathing and dressing sit to stand at the sink this session.  She was able to self sequence through all ADL tasks, letting therapist know what she needed to complete next.  Noted increased lean to the left in sitting with LB dressing tasks, with pt needing support of wheelchair arm rest to keep from falling.  Decreased ability to use the LUE for tying shoes with therapist having to assist.  Min guard assist for sit to stand and standing balance when washing peri area or pulling pants over hips.  Discussed expectations of progress and ELOS with pt at end of session.  She was left in the wheelchair with call button in place and lunch setup for her to eat.    OT Evaluation Precautions/Restrictions  Precautions Precautions: Fall  Vital Signs Therapy Vitals Temp: 98 F (36.7 C) Temp Source: Oral Pulse Rate: 70 Resp: 18 BP: 135/67 mmHg Patient Position (if appropriate): Lying Oxygen Therapy SpO2: 100 % O2 Device: Not Delivered Pain  No report of pain during session  Home Living/Prior Functioning Home Living Living Arrangements: Children Available Help at Discharge: Family, Available PRN/intermittently Type of Home: House Home Access: Stairs to enter Technical brewer of Steps: 4-5 Entrance Stairs-Rails: Right Home Layout: Two level, Bed/bath upstairs Alternate Level Stairs-Number of Steps: 1 flight Alternate Level Stairs-Rails: Right Bathroom Shower/Tub: Multimedia programmer: Handicapped height  Lives With:  Son IADL History Homemaking Responsibilities: Yes Meal Prep Responsibility: Secondary Laundry Responsibility: Secondary Cleaning Responsibility: Secondary Mode of Transportation: Car Occupation: Retired Prior  Function Level of Independence: Independent with basic ADLs  Able to Take Stairs?: Yes Driving: Yes Vocation: Retired Leisure: Hobbies-yes (Comment) Comments: Cooks, cleans, drives. No falls reported. ADL  See Function Section of chart for details  Vision/Perception  Vision- History Baseline Vision/History: Wears glasses Wears Glasses: Reading only Patient Visual Report: No change from baseline Vision- Assessment Vision Assessment?: Vision impaired- to be further tested in functional context;Yes Ocular Range of Motion: Within Functional Limits Tracking/Visual Pursuits: Able to track stimulus in all quads without difficulty Saccades: Within functional limits Visual Fields: No apparent deficits  Cognition Overall Cognitive Status: Within Functional Limits for tasks assessed Arousal/Alertness: Awake/alert Orientation Level: Person;Place;Situation Person: Oriented Place: Oriented Situation: Oriented Year: 2017 Month: June Day of Week: Correct Memory: Appears intact Immediate Memory Recall: Sock;Blue;Bed Memory Recall: Sock;Blue;Bed Memory Recall Sock: Without Cue Memory Recall Blue: Without Cue Memory Recall Bed: Without Cue Attention: Sustained Sustained Attention: Appears intact Selective Attention: Appears intact Awareness: Appears intact Problem Solving: Appears intact Safety/Judgment: Appears intact Comments: alert and multi step commands Sensation Sensation Light Touch: Appears Intact Stereognosis: Appears Intact Proprioception: Impaired Detail Proprioception Impaired Details: Impaired LUE Additional Comments: Pt with slight impairment in proprioception in the wrist and digits on the left hand compared to the right. Coordination Gross Motor Movements  are Fluid and Coordinated: No Fine Motor Movements are Fluid and Coordinated: No Coordination and Movement Description: Pt uses the LUE functionally for all selfcare tasks but with decreased speed and decreased FM coordination, with pt needing assistance for tying shoes Motor  Motor Motor: Hemiplegia;Abnormal postural alignment and control Mobility  Bed Mobility Bed Mobility: Supine to Sit;Sit to Supine Supine to Sit: 5: Supervision Sit to Supine: 5: Supervision Transfers Transfers: Sit to Stand;Stand to Sit Sit to Stand: 4: Min assist Sit to Stand Details (indicate cue type and reason): with cues for hand placement and safety Stand to Sit: 4: Min assist Stand to Sit Details: with cues for hand placement and safety  Trunk/Postural Assessment  Cervical Assessment Cervical Assessment: Exceptions to [facility] (forward cervical protraction with slight flexion secondary to thoracic posture) Thoracic Assessment Thoracic Assessment: Exceptions to [facility] (slight thoracic kyphosis present) Lumbar Assessment Lumbar Assessment: Exceptions to [facility] (slight posterior pelvic tilt) Postural Control Postural Control: Deficits on evaluation Trunk Control: increased weightshift to the left in sitting with dynamic selfcare tasks.  Balance Balance Balance Assessed: Yes Static Sitting Balance Static Sitting - Balance Support: Right upper extremity supported;Left upper extremity supported Static Sitting - Level of Assistance: 5: Stand by assistance Dynamic Sitting Balance Dynamic Sitting - Balance Support: No upper extremity supported Dynamic Sitting - Level of Assistance: 4: Min assist Static Standing Balance Static Standing - Balance Support: Right upper extremity supported;Left upper extremity supported Static Standing - Level of Assistance: 4: Min assist Dynamic Standing Balance Dynamic Standing - Balance Support: No upper extremity supported Dynamic Standing - Level of Assistance: 4: Min  assist Extremity/Trunk Assessment RUE Assessment RUE Assessment:  (strength 4/5 throughout with AROM WFLs for all joints) RUE AROM (degrees) Overall AROM Right Upper Extremity: Within functional limits for tasks performed RUE Overall AROM Comments: Pt with isolated movement in all joints with shoulder AROM 0-120 degrees.  All other joints AROM WFLS with overall strength 3/5 at the shoulder and 3+/5 at the elbow and for grip.  Pt able to oppose thumb to all digits but unable to accurately tie shoes with decreased FM noted. RUE Strength RUE Overall Strength Comments: grossly 3+/5 LUE Assessment LUE Assessment: Exceptions to [facility]  LUE AROM (degrees) Overall AROM Left Upper Extremity: Deficits LUE Overall AROM Comments: Pt with isolated movement in all joints with shoulder AROM 0-120 degrees.  All other joints AROM WFLS with overall strength 3/5 at the shoulder and 3+/5 at the elbow and for grip.  Pt able to oppose thumb to all digits but unable to accurately tie shoes with decreased FM noted. LUE Strength LUE Overall Strength: Deficits LUE Overall Strength Comments: grossly 3/5 except 3-/5 shoulder abd/flex   See Function Navigator for Current Functional Status.   Refer to Care Plan for Long Term Goals  Recommendations for other services: None  Discharge Criteria: Patient will be discharged from OT if patient refuses treatment 3 consecutive times without medical reason, if treatment goals not met, if there is a change in medical status, if patient makes no progress towards goals or if patient is discharged from hospital.  The above assessment, treatment plan, treatment alternatives and goals were discussed and mutually agreed upon: by patient  Rosebud Koenen OTR/L 11/18/2015, 3:24 PM

## 2015-11-18 NOTE — Progress Notes (Signed)
Subjective/Complaints: No issues overnite except worried alot  ROS-no SOB, pain over loop site, last BM 1 day ago  Objective: Vital Signs: Blood pressure 186/70, pulse 82, temperature 97.9 F (36.6 C), temperature source Oral, resp. rate 18, height 5' 1"  (1.549 m), weight 53.7 kg (118 lb 6.2 oz), SpO2 99 %. No results found. Results for orders placed or performed during the hospital encounter of 11/17/15 (from the past 72 hour(s))  CBC     Status: None   Collection Time: 11/17/15  7:48 PM  Result Value Ref Range   WBC 4.3 4.0 - 10.5 K/uL   RBC 4.92 3.87 - 5.11 MIL/uL   Hemoglobin 14.6 12.0 - 15.0 g/dL   HCT 44.8 36.0 - 46.0 %   MCV 91.1 78.0 - 100.0 fL   MCH 29.7 26.0 - 34.0 pg   MCHC 32.6 30.0 - 36.0 g/dL   RDW 14.2 11.5 - 15.5 %   Platelets 161 150 - 400 K/uL  Creatinine, serum     Status: None   Collection Time: 11/17/15  7:48 PM  Result Value Ref Range   Creatinine, Ser 0.69 0.44 - 1.00 mg/dL   GFR calc non Af Amer >60 >60 mL/min   GFR calc Af Amer >60 >60 mL/min    Comment: (NOTE) The eGFR has been calculated using the CKD EPI equation. This calculation has not been validated in all clinical situations. eGFR's persistently <60 mL/min signify possible Chronic Kidney Disease.      HEENT: normal Cardio: RRR and loop site tender but no swelling Resp: CTA B/L GI: BS positive and non tender non distended Extremity:  Pulses positive and No Edema Skin:   Intact Neuro: Alert/Oriented and Abnormal Motor 4- LUE, 4/5 LLE, 5/5 on RIght side Musc/Skel:  Normal Gen NAD   Assessment/Plan: 1. 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 Physiatrist is providing close team supervision and 24 hour management of active medical problems listed below. Physiatrist and rehab team continue to assess barriers to  discharge/monitor patient progress toward functional and medical goals. FIM:                   Function - Comprehension Comprehension: Auditory Comprehension assist level: Follows complex conversation/direction with extra time/assistive device  Function - Expression Expression: Verbal Expression assist level: Expresses complex ideas: With extra time/assistive device  Function - Social Interaction Social Interaction assist level: Interacts appropriately 90% of the time - Needs monitoring or encouragement for participation or interaction.  Function - Problem Solving Problem solving assist level: Solves complex problems: With extra time  Function - Memory Memory assist level: More than reasonable amount of time Patient normally able to recall (first 3 days only): Current season, Location of own room, Staff names and faces, That he or she is in a hospital     Medical Problem List and Plan: 1. 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 DVT Prophylaxis/Anticoagulation: Subcutaneous Lovenox. Monitor platelet counts and for any signs of bleeding 3. Pain Management: Tylenol as needed. No pain at this time 4. Hypertension. Permissive hypertension for perfusion. Monitor BP with increased mobility 5. Neuropsych: This patient is capable of making decisions on her own behalf. 6. Skin/Wound Care: Routine skin checks 7. Fluids/Electrolytes/Nutrition: Routine I&O's with follow-up chemistries 8. Hyperlipidemia. Lipitor 9. Irritable bowel syndrome.  Continue Librax 10. Essential tremor. Patient on Inderal 20 mg daily prior to admission. Resume as needed 11. Asthma. Continue [name] 100 mg twice a day  LOS (Days) 1 A FACE TO FACE EVALUATION WAS PERFORMED  KIRSTEINS,ANDREW E 11/18/2015, 7:52 AM

## 2015-11-18 NOTE — Evaluation (Signed)
Physical Therapy Assessment and Plan  Patient Details  Name: Carmen Duncan MRN: [id number] Date of Birth: [birth year]  PT Diagnosis: Abnormal posture, Abnormality of gait, Difficulty walking, Hemiparesis non-dominant and Muscle weakness Rehab Potential: Good ELOS: 7-10 days   Today's Date: 11/18/2015 PT Individual Time: 0900-1000, 1300-1330 PT Individual Time Calculation (min): 60 min, 30 min  Problem List:  Patient Active Problem List   Diagnosis Date Noted  . CVA (cerebral infarction) 11/17/2015  . Left hemiparesis ([place])   . Acute CVA (cerebrovascular accident) ([place]) 11/15/2015  . CVA (cerebral vascular accident) ([place]) 11/14/2015  . Left-sided muscle weakness 11/14/2015  . Left-sided weakness 11/14/2015  . Breast cancer of upper-outer quadrant of right female breast ([place]) 07/19/2015  . B12 deficiency 03/14/2015  . Bilateral carotid artery disease ([place]) 10/07/2014  . Pulmonary hypertension ([place]) 10/07/2014  . Ejection fraction   . Cryptogenic stroke ([place]) 10/04/2014  . Oral candidiasis 04/28/2014  . Chronic diastolic congestive heart failure ([place]) 04/28/2014  . IPF (idiopathic pulmonary fibrosis) ([place]) 04/21/2014  . Hyponatremia 04/19/2014  . Thrombocytopenia ([place]) 04/19/2014  . Malnutrition of moderate degree ([place]) 04/19/2014  . Hypertension 04/18/2014  . Hyperlipidemia 04/18/2014  . Hypovolemia due to dehydration 04/18/2014  . Dyspnea on exertion 04/04/2014  . Chest rales 04/04/2014  . Abnormal nuclear stress test 04/04/2014  . HOH (hard of hearing)   . Raynaud's phenomenon 03/21/2014  . Tremor, essential 03/12/2013  . Unspecified hereditary and idiopathic peripheral neuropathy 08/04/2012  . Squamous cell skin cancer 08/19/2011  . 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 Dyslipidemia 04/03/2010  . Essential hypertension 04/03/2010  . GERD 04/03/2010  . IBS 04/03/2010  . DEEP VENOUS THROMBOPHLEBITIS, HX OF 04/03/2010  . DIVERTICULITIS, HX OF 04/03/2010    Past Medical  History:  Past Medical History  Diagnosis Date  . Arthritis   . Asthma   . Hypertension   . GERD (gastroesophageal reflux disease)   . Hyperlipidemia   . Diverticulosis   . IBS (irritable bowel syndrome)   . HOH (hard of hearing) left ear  . Acoustic neuroma ([place])   . Ejection fraction   . Breast cancer of upper-outer quadrant of right female breast ([place]) 07/19/2015  . Brain cancer ([place])   . Anxiety   . Stroke [facility])     no deficits, on plavix  . Essential tremor     on inderal  . Pneumonia     04-2014, CAP  . Interstitial lung disease [facility])    Past Surgical History:  Past Surgical History  Procedure Laterality Date  . Cholecystectomy  2011  . Abdominal hysterectomy  1980  . Tonsillectomy  1945  . Biopsy breast    . Eyes  2007    cararacts  . Tee without cardioversion N/A 09/05/2014    Procedure: TRANSESOPHAGEAL ECHOCARDIOGRAM (TEE);  Surgeon: Lelon Perla, MD;  Location: [facility] ENDOSCOPY;  Service: Cardiovascular;  Laterality: N/A;  . Breast lumpectomy with radioactive seed and sentinel lymph node biopsy Right 08/10/2015    Procedure: BREAST LUMPECTOMY WITH RADIOACTIVE SEED AND SENTINEL LYMPH NODE BIOPSY;  Surgeon: Autumn Messing III, MD;  [facility];  Service: General;  Laterality: Right;  . Ep implantable device N/A 11/16/2015    Procedure: Loop Recorder Insertion;  Surgeon: [name], MD;  Location: [place] CV LAB;  Service: Cardiovascular;  Laterality: N/A;  . Tee without cardioversion N/A 11/16/2015    Procedure: TRANSESOPHAGEAL ECHOCARDIOGRAM (TEE);  Surgeon: Larey Dresser, MD;  Location: [place];  Service:  Cardiovascular;  Laterality: N/A;    Assessment & Plan Clinical Impression: Patient is a 75 y.o. year old female with recent admission to the hospital on 11/14/15 with corona radiata and R periventricular white matter infarct.  Patient transferred to CIR on 11/17/2015 .   Patient currently requires min with mobility secondary to muscle  weakness, decreased cardiorespiratoy endurance, impaired timing and sequencing and unbalanced muscle activation and decreased sitting balance, decreased standing balance, decreased postural control, hemiplegia and decreased balance strategies.  Prior to hospitalization, patient was independent  with mobility and lived with   in a House home.  Home access is 4-5Stairs to enter.  Patient will benefit from skilled PT intervention to maximize safe functional mobility, minimize fall risk and decrease caregiver burden for planned discharge home with intermittent assist.  Anticipate patient will benefit from follow up [facility] at discharge.  PT - End of Session Endurance Deficit: Yes PT Assessment Rehab Potential (ACUTE/IP ONLY): Good Barriers to Discharge: Decreased caregiver support;Inaccessible home environment Barriers to Discharge Comments: son works and stairs to enter PT Patient demonstrates impairments in the following area(s): Balance;Endurance;Motor;Pain PT Transfers Functional Problem(s): Bed Mobility;Bed to Chair;Car;Furniture;Floor PT Locomotion Functional Problem(s): Ambulation;Stairs;Wheelchair Mobility PT Plan PT Intensity: Minimum of 1-2 x/day ,45 to 90 minutes PT Frequency: 5 out of 7 days PT Duration Estimated Length of Stay: 7-10 days PT Treatment/Interventions: Ambulation/gait training;DME/adaptive equipment instruction;Psychosocial support;UE/LE Strength taining/ROM;Balance/vestibular training;Functional electrical stimulation;UE/LE Coordination activities;Cognitive remediation/compensation;Functional mobility training;Community reintegration;Neuromuscular re-education;Stair training;Wheelchair propulsion/positioning;Discharge planning;Therapeutic Activities;Disease management/prevention;Patient/family education;Therapeutic Exercise PT Transfers Anticipated Outcome(s): Mod I PT Locomotion Anticipated Outcome(s): Mod I PT Recommendation Follow Up Recommendations: Home health PT;24 hour  supervision/assistance Equipment Recommended: None recommended by PT Equipment Details: reassess by D/C  Skilled Therapeutic Intervention Tx 1: Pt performs transfers x10 in session. Pt performs static standing 1'x3. Pt performs LUE forced use with weight shifts, anterior weight shifts, BLE advancement 2x10. Pt educated on rehab plan, safety in mobility, anticipated disposition, and rehab progress.   Tx 2: Pt performs transfers x3 in session. BP 149/73 in supine to 114/64 in standing indicative or orthostasis. Pt educated on orthostasis, hydration, progressing mobility, and rehab plan. SLR, hip add isometrics, hip abd AROM, anterior weight shifts in sitting, LAQs, marching 2x10. Pt limited by fatigue in pm session  PT Evaluation Precautions/Restrictions Precautions Precautions: Fall Restrictions Weight Bearing Restrictions: No General Chart Reviewed: Yes Vital Signs Pain Pain Assessment Pain Assessment: No/denies pain Home Living/Prior Functioning Home Living Available Help at Discharge: Family;Available PRN/intermittently Type of Home: House Home Access: Stairs to enter [employer] of Steps: 4-5 Entrance Stairs-Rails: Right Home Layout: Two level;Bed/bath upstairs Alternate Level Stairs-Number of Steps: 1 flight Alternate Level Stairs-Rails: Right Bathroom Shower/Tub: Multimedia programmer: Handicapped height Prior Function Level of Independence: Independent with basic ADLs;Independent with gait  Able to Take Stairs?: Yes Driving: Yes Vocation: Retired Leisure: Hobbies-yes (Comment) (loves to travel) Comments: Cooks, cleans, drives. No falls reported. Cognition Safety/Judgment: Appears intact Comments: alert and multi step commands Sensation Sensation Light Touch: Appears Intact (to light touch) Motor  Motor Motor: Hemiplegia;Abnormal postural alignment and control  Mobility Bed Mobility Bed Mobility: Supine to Sit;Sit to Supine Supine to Sit: 5:  Supervision Sit to Supine: 5: Supervision Transfers Transfers: Yes Sit to Stand: 4: Min assist Sit to Stand Details (indicate cue type and reason): with cues for hand placement and safety Stand to Sit: 4: Min assist Stand to Sit Details: with cues for hand placement and safety Stand Pivot Transfers: 4: Min assist Stand Pivot  Transfer Details (indicate cue type and reason): with cues for hand placement and safety Locomotion  Ambulation Ambulation: Yes Ambulation/Gait Assistance: 4: Min assist Ambulation Distance (Feet): 120 Feet Assistive device: None Ambulation/Gait Assistance Details: with cues for core activation, weight shift Gait Gait: Yes Gait velocity: decreased Stairs / Additional Locomotion Stairs: Yes Stairs Assistance: 4: Min assist Stairs Assistance Details (indicate cue type and reason): with cues for core activation and sequencing Stair Management Technique: One rail Right Number of Stairs: 4 Ramp: 4: Min assist Curb: 4: Min Chemical engineer: Yes Wheelchair Assistance: 3: Mod Lexicographer: Both upper extremities Distance: 20' with cues for technique  Balance Balance Balance Assessed: Yes Static Sitting Balance Static Sitting - Level of Assistance: 5: Stand by assistance Dynamic Sitting Balance Dynamic Sitting - Level of Assistance: 5: Stand by assistance Static Standing Balance Static Standing - Level of Assistance: 4: Min assist Dynamic Standing Balance Dynamic Standing - Level of Assistance: 4: Min assist Extremity Assessment  RUE Assessment RUE Assessment: Exceptions to [facility] RUE AROM (degrees) Overall AROM Right Upper Extremity: Within functional limits for tasks performed RUE Strength RUE Overall Strength Comments: grossly 3+/5 LUE Assessment LUE Assessment: Exceptions to WFL LUE AROM (degrees) Overall AROM Left Upper Extremity: Deficits LUE Overall AROM Comments: grossly WFL except shoulder flex/abd  to 100 degrees LUE Strength LUE Overall Strength: Deficits LUE Overall Strength Comments: grossly 3/5 except 3-/5 shoulder abd/flex RLE Assessment RLE Assessment: Exceptions to [facility] RLE AROM (degrees) Overall AROM Right Lower Extremity: Within functional limits for tasks assessed RLE Strength RLE Overall Strength Comments: grossly 3+/5 t/o LLE Assessment LLE Assessment: Exceptions to WFL LLE AROM (degrees) Overall AROM Left Lower Extremity: Deficits LLE Strength LLE Overall Strength Comments: grossly 3+/5 t/o  See Function Navigator for Current Functional Status.   Refer to Care Plan for Long Term Goals  Recommendations for other services: None  Discharge Criteria: Patient will be discharged from PT if patient refuses treatment 3 consecutive times without medical reason, if treatment goals not met, if there is a change in medical status, if patient makes no progress towards goals or if patient is discharged from hospital.  The above assessment, treatment plan, treatment alternatives and goals were discussed and mutually agreed upon: by patient  Monia Pouch 11/18/2015, 9:44 AM

## 2015-11-19 ENCOUNTER — Inpatient Hospital Stay (HOSPITAL_COMMUNITY): Payer: Medicare Other | Admitting: Physical Therapy

## 2015-11-19 DIAGNOSIS — I6330 Cerebral infarction due to thrombosis of unspecified cerebral artery: Secondary | ICD-10-CM

## 2015-11-19 NOTE — Progress Notes (Signed)
Subjective/Complaints: Seen in PT  ROS-no SOB, pain over loop site, last BM 1 day ago  Objective: Vital Signs: Blood pressure 151/66, pulse 75, temperature 98.2 F (36.8 C), temperature source Oral, resp. rate 16, height 5' 1" (1.549 m), weight 53.7 kg (118 lb 6.2 oz), SpO2 98 %. No results found. Results for orders placed or performed during the hospital encounter of 11/17/15 (from the past 72 hour(s))  CBC     Status: None   Collection Time: 11/17/15  7:48 PM  Result Value Ref Range   WBC 4.3 4.0 - 10.5 K/uL   RBC 4.92 3.87 - 5.11 MIL/uL   Hemoglobin 14.6 12.0 - 15.0 g/dL   HCT 44.8 36.0 - 46.0 %   MCV 91.1 78.0 - 100.0 fL   MCH 29.7 26.0 - 34.0 pg   MCHC 32.6 30.0 - 36.0 g/dL   RDW 14.2 11.5 - 15.5 %   Platelets 161 150 - 400 K/uL  Creatinine, serum     Status: None   Collection Time: 11/17/15  7:48 PM  Result Value Ref Range   Creatinine, Ser 0.69 0.44 - 1.00 mg/dL   GFR calc non Af Amer >60 >60 mL/min   GFR calc Af Amer >60 >60 mL/min    Comment: (NOTE) The eGFR has been calculated using the CKD EPI equation. This calculation has not been validated in all clinical situations. eGFR's persistently <60 mL/min signify possible Chronic Kidney Disease.      HEENT: normal Cardio: RRR and loop site tender but no swelling Resp: CTA B/L GI: BS positive and non tender non distended Extremity:  Pulses positive and No Edema Skin:   Intact Neuro: Alert/Oriented and Abnormal Motor 4- LUE, 4/5 LLE, 5/5 on RIght side Musc/Skel:  Normal Gen NAD   Assessment/Plan: 1. 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 Physiatrist is providing close team supervision and 24 hour management of active medical problems listed below. Physiatrist and rehab team continue to assess barriers to discharge/monitor patient progress  toward functional and medical goals. FIM: Function - Bathing Position: Wheelchair/chair at sink Body parts bathed by patient: Right arm, Left arm, Chest, Abdomen, Front perineal area, Buttocks, Right upper leg, Left upper leg, Right lower leg, Left lower leg Body parts bathed by helper: Back Assist Level: Touching or steadying assistance(Pt > 75%)  Function- Upper Body Dressing/Undressing What is the patient wearing?: Pull over shirt/dress, Bra Bra - Perfomed by patient: Thread/unthread right bra strap, Thread/unthread left bra strap, Hook/unhook bra (pull down sports bra) Pull over shirt/dress - Perfomed by patient: Thread/unthread right sleeve, Thread/unthread left sleeve, Put head through opening, Pull shirt over trunk Assist Level: Supervision or verbal cues Function - Lower Body Dressing/Undressing What is the patient wearing?: Underwear, Pants, Socks, Shoes Position: Wheelchair/chair at [employer] - Performed by patient: Thread/unthread right underwear leg, Thread/unthread left underwear leg, Pull underwear up/down Pants- Performed by patient: Thread/unthread right pants leg, Pull pants up/down, Thread/unthread left pants leg Pants- Performed by helper: Fasten/unfasten pants Socks - Performed by patient: Don/doff right sock, Don/doff left sock Shoes - Performed by patient: Don/doff left shoe, Don/doff right shoe Shoes - Performed by helper: Fasten right, Fasten left        Function - Chair/bed transfer Chair/bed transfer assist level: Touching or steadying assistance (Pt > 75%)  Function - Locomotion: Wheelchair Will patient use wheelchair at discharge?: Yes  Type: Manual Max wheelchair distance: 20 Assist Level: Moderate assistance (Pt 50 - 74%) Wheel 50 feet with 2 turns activity did not occur: Safety/medical concerns Wheel 150 feet activity did not occur: Safety/medical concerns Turns around,maneuvers to table,bed, and toilet,negotiates 3% grade,maneuvers on rugs and  over doorsills: Yes Function - Locomotion: Ambulation Assistive device: No device Max distance: 120 Assist level: Touching or steadying assistance (Pt > 75%) Assist level: Touching or steadying assistance (Pt > 75%) Assist level: Touching or steadying assistance (Pt > 75%) Assist level: Touching or steadying assistance (Pt > 75%) Assist level: Touching or steadying assistance (Pt > 75%)  Function - Comprehension Comprehension: Auditory Comprehension assist level: Follows complex conversation/direction with extra time/assistive device  Function - Expression Expression: Verbal Expression assist level: Expresses complex ideas: With extra time/assistive device  Function - Social Interaction Social Interaction assist level: Interacts appropriately 90% of the time - Needs monitoring or encouragement for participation or interaction.  Function - Problem Solving Problem solving assist level: Solves complex problems: With extra time  Function - Memory Memory assist level: More than reasonable amount of time Patient normally able to recall (first 3 days only): Current season, Location of own room, Staff names and faces, That he or she is in a hospital     Medical Problem List and Plan: 1. Left hemiplegia secondary to right corona radiata and lenticulostriate infarct.S/P loop recorder, likely thrombotic -PT,OT SLP , CIR level 2. DVT Prophylaxis/Anticoagulation: Subcutaneous Lovenox. Monitor platelet counts and for any signs of bleeding 3. Pain Management: Tylenol as needed. No pain at this time 4. Hypertension. Permissive hypertension for perfusion. Monitor BP with increased mobility 5. Neuropsych: This patient is capable of making decisions on her own behalf. 6. Skin/Wound Care: Routine skin checks 7. Fluids/Electrolytes/Nutrition: Routine I&O's with follow-up chemistries 8. Hyperlipidemia. Lipitor 9. Irritable bowel syndrome. Continue Librax 10. Essential tremor.  Patient on Inderal 20 mg daily prior to admission. Resume as needed 11. Asthma. Continue [name] 100 mg twice a day  LOS (Days) 2 A FACE TO FACE EVALUATION WAS PERFORMED  Rockell Faulks E 11/19/2015, 9:25 AM

## 2015-11-19 NOTE — Progress Notes (Signed)
Physical Therapy Session Note  Patient Details  Name: Carmen Duncan MRN: [id number] Date of Birth: 02-22-1941  Today's Date: 11/19/2015 PT Individual Time: 1330-1430 PT Individual Time Calculation (min): 60 min   Short Term Goals: Week 1:  PT Short Term Goal 1 (Week 1): Pt will perfom bed mobility with SBA and LRAD PT Short Term Goal 2 (Week 1): Pt will perform transfers with SBA and LRAD PT Short Term Goal 3 (Week 1): Pt will perform 150' gait with SBA and LRAD PT Short Term Goal 4 (Week 1): Pt will perform 4 stairs with SBA and LRAD  Skilled Therapeutic Interventions/Progress Updates:    Pt with limited activation of R lateral L/S flexors, and R hip abd that benefit from facilitation (particularly hip abd) to improve hip drop in gait. Pt with difficulty carrying over these cues into gait on own though. Pt would continue to benefit from skilled PT services to increase functional mobility.  Therapy Documentation Precautions:  Precautions Precautions: Fall Restrictions Weight Bearing Restrictions: No Vital Signs: Therapy Vitals Temp: 97.5 F (36.4 C) Temp Source: Oral Pulse Rate: 74 Resp: 16 BP: (!) 121/101 mmHg Patient Position (if appropriate): Sitting Oxygen Therapy SpO2: 98 % O2 Device: Not Delivered Pain: Pain Assessment Pain Assessment: No/denies pain Mobility:  Min Guard with cues for weight shift and safety         Locomotion :   Min A with cues for core and hip abd activation 120', 40'x5 Other Treatments:  Pt educated on rehab plan, safety in mobility, deficits, and progressing mobility. Pt performs hip ER with orange band 2x10. Pt performs transfers x15 in session. Pt performs BLE advancement, weight shifting, standing with R weight shift and resisted L anterior elevation 2x10. Pt performs static standing 1'x2. Pt performs marching 2x10.   See Function Navigator for Current Functional Status.   Therapy/Group: Individual Therapy  Monia Pouch 11/19/2015,  1:54 PM

## 2015-11-20 ENCOUNTER — Inpatient Hospital Stay (HOSPITAL_COMMUNITY): Payer: Medicare Other | Admitting: Physical Therapy

## 2015-11-20 ENCOUNTER — Inpatient Hospital Stay (HOSPITAL_COMMUNITY): Payer: Medicare Other | Admitting: Occupational Therapy

## 2015-11-20 LAB — CBC WITH DIFFERENTIAL/PLATELET
BASOS ABS: 0 10*3/uL (ref 0.0–0.1)
BASOS PCT: 1 %
EOS PCT: 8 %
Eosinophils Absolute: 0.3 10*3/uL (ref 0.0–0.7)
HCT: 41 % (ref 36.0–46.0)
Hemoglobin: 13.5 g/dL (ref 12.0–15.0)
LYMPHS PCT: 21 %
Lymphs Abs: 0.8 10*3/uL (ref 0.7–4.0)
MCH: 29.8 pg (ref 26.0–34.0)
MCHC: 32.9 g/dL (ref 30.0–36.0)
MCV: 90.5 fL (ref 78.0–100.0)
Monocytes Absolute: 0.4 10*3/uL (ref 0.1–1.0)
Monocytes Relative: 11 %
NEUTROS ABS: 2.3 10*3/uL (ref 1.7–7.7)
Neutrophils Relative %: 59 %
PLATELETS: 171 10*3/uL (ref 150–400)
RBC: 4.53 MIL/uL (ref 3.87–5.11)
RDW: 13.9 % (ref 11.5–15.5)
WBC: 3.9 10*3/uL — AB (ref 4.0–10.5)

## 2015-11-20 LAB — COMPREHENSIVE METABOLIC PANEL
ALBUMIN: 3.1 g/dL — AB (ref 3.5–5.0)
ALT: 20 U/L (ref 14–54)
ANION GAP: 5 (ref 5–15)
AST: 23 U/L (ref 15–41)
Alkaline Phosphatase: 69 U/L (ref 38–126)
BUN: 16 mg/dL (ref 6–20)
CHLORIDE: 107 mmol/L (ref 101–111)
CO2: 28 mmol/L (ref 22–32)
Calcium: 9.1 mg/dL (ref 8.9–10.3)
Creatinine, Ser: 0.66 mg/dL (ref 0.44–1.00)
GFR calc Af Amer: >60 mL/min (ref >60)
GFR calc non Af Amer: >60 mL/min (ref >60)
GLUCOSE: 100 mg/dL — AB (ref 65–99)
POTASSIUM: 4.5 mmol/L (ref 3.5–5.1)
Sodium: 140 mmol/L (ref 135–145)
TOTAL PROTEIN: 7 g/dL (ref 6.5–8.1)
Total Bilirubin: 0.7 mg/dL (ref 0.3–1.2)

## 2015-11-20 MED ORDER — BISACODYL 10 MG RE SUPP
10.0000 mg | Freq: Once | RECTAL | Status: AC
  Administered 2015-11-20: 10 mg via RECTAL
  Filled 2015-11-20: qty 1

## 2015-11-20 MED ORDER — SENNOSIDES-DOCUSATE SODIUM 8.6-50 MG PO TABS
1.0000 | ORAL_TABLET | Freq: Two times a day (BID) | ORAL | Status: DC
  Administered 2015-11-20 – 2015-11-27 (×15): 1 via ORAL
  Filled 2015-11-20 (×14): qty 1

## 2015-11-20 NOTE — Progress Notes (Signed)
Physical Therapy Session Note  Patient Details  Name: Carmen Duncan MRN: [id number] Date of Birth: 1941/01/10  Today's Date: 11/20/2015 PT Individual Time: 1100-1200 PT Individual Time Calculation (min): 60 min   Short Term Goals: Week 1:  PT Short Term Goal 1 (Week 1): Pt will perfom bed mobility with SBA and LRAD PT Short Term Goal 2 (Week 1): Pt will perform transfers with SBA and LRAD PT Short Term Goal 3 (Week 1): Pt will perform 150' gait with SBA and LRAD PT Short Term Goal 4 (Week 1): Pt will perform 4 stairs with SBA and LRAD  Skilled Therapeutic Interventions/Progress Updates:   Pt received lying in bed; pt reports feeling nauseous earlier but improved now.  Pt denies pain.  Pt requesting to use bathroom before going to the gym.  Pt performed supine > sit with supervision.  Performed sit > stand and ambulated to toilet with min HHA on L side; pt performed all toileting tasks with supervision-min A for balance in standing.  Transitioned to sink and performed hand hygiene in standing with supervision.  In gym reviewed balance assessment, previous score and performed re-assessment of BERG with improvement in score; discussed risk for falls and areas to continue to focus on.  Following BERG pt began to c/o feeling dizzy and nauseous.  Vitals assessed and pt h/o dizziness obtained.  Due to impaired memory unable to determine h/o symptoms, frequent, duration and aggravating/alleviating factors.  Pt reporting symptoms improved and agreeable to stair negotiation training.  Performed up/down flight of 8 steps >> 12 steps with UE support on R rail only to simulate home environment with min A and verbal cues for safe step to sequence.  During stair negotiation pt reported one episode of dizziness and nausea but recovered quickly.  Pt also c/o LE fatigue and weakness.  Returned to room in w/c and set up with tray for lunch.  Pt left in w/c with sister and grandson present.   Therapy  Documentation Precautions:  Precautions Precautions: Fall Restrictions Weight Bearing Restrictions: No Vital Signs: Therapy Vitals Pulse Rate: 84 BP: 136/72 mmHg Patient Position (if appropriate): Sitting Oxygen Therapy SpO2: 100 % O2 Device: Not Delivered Pain: Pain Assessment Pain Assessment: No/denies pain Balance: Standardized Balance Assessment Standardized Balance Assessment: Berg Balance Test Berg Balance Test Sit to Stand: Able to stand without using hands and stabilize independently Standing Unsupported: Able to stand safely 2 minutes Sitting with Back Unsupported but Feet Supported on Floor or Stool: Able to sit safely and securely 2 minutes Stand to Sit: Sits safely with minimal use of hands Transfers: Able to transfer safely, minor use of hands Standing Unsupported with Eyes Closed: Able to stand 10 seconds safely Standing Ubsupported with Feet Together: Able to place feet together independently and stand for 1 minute with supervision From Standing, Reach Forward with Outstretched Arm: Can reach confidently >25 cm (10") From Standing Position, Pick up Object from Floor: Able to pick up shoe safely and easily From Standing Position, Turn to Look Behind Over each Shoulder: Looks behind from both sides and weight shifts well Turn 360 Degrees: Able to turn 360 degrees safely but slowly Standing Unsupported, Alternately Place Feet on Step/Stool: Able to complete >2 steps/needs minimal assist Standing Unsupported, One Foot in Front: Able to take small step independently and hold 30 seconds Standing on One Leg: Unable to try or needs assist to prevent fall Total Score: 44 Patient demonstrates increased fall risk as noted by score of 44/56 on  Berg Balance Scale.  (<36= high risk for falls, close to 100%; 37-45 significant >80%; 46-51 moderate >50%; 52-55 lower >25%)   See Function Navigator for Current Functional Status.   Therapy/Group: Individual Therapy  Raylene Everts  [facility] 11/20/2015, 12:43 PM

## 2015-11-20 NOTE — IPOC Note (Addendum)
Overall Plan of Care [facility]) Patient Details Name: ALIYAAH AHLIN MRN: [id number] DOB: 03/27/1941  Admitting Diagnosis: CVA  Hospital Problems: Principal Problem:   Cryptogenic stroke [facility]) Active Problems:   Essential hypertension   Chronic diastolic congestive heart failure (HCC)   Left hemiparesis (HCC)   CVA (cerebral infarction)     Functional Problem List: Nursing Medication Management, Endurance, Motor, Nutrition, Safety, Sensory, Skin Integrity  PT Balance, Endurance, Motor, Pain  OT Balance, Motor  SLP    TR         Basic ADL's: OT Eating, Grooming, Bathing, Dressing, Toileting     Advanced  ADL's: OT Laundry, Light Housekeeping, Simple Meal Preparation     Transfers: PT Bed Mobility, Bed to Chair, Car, Furniture, Floor  OT Toilet, Metallurgist: PT Ambulation, Stairs, Emergency planning/management officer     Additional Impairments: OT Fuctional Use of Upper Extremity  SLP        TR      Anticipated Outcomes Item Anticipated Outcome  Self Feeding modified independent  Swallowing      Basic self-care  modified independent  Toileting  modified independent   Bathroom Transfers modified independent to supervision  Bowel/Bladder  Pt will manage bowel and bladder with min assist   Transfers  Mod I  Locomotion  Mod I  Communication     Cognition     Pain  Pt will rate pain at 4 or less on a scale of 0-10.   Safety/Judgment  Pt will remain free of falls an injury with min assist    Therapy Plan: PT Intensity: Minimum of 1-2 x/day ,45 to 90 minutes PT Frequency: 5 out of 7 days PT Duration Estimated Length of Stay: 7-10 days OT Intensity: Minimum of 1-2 x/day, 45 to 90 minutes OT Frequency: 5 out of 7 days OT Duration/Estimated Length of Stay: 7-9 days         Team Interventions: Nursing Interventions Patient/Family Education, Medication Management, Disease Management/Prevention, Skin Care/Wound Management, Cognitive Remediation/Compensation,  Discharge Planning  PT interventions Ambulation/gait training, DME/adaptive equipment instruction, Psychosocial support, UE/LE Strength taining/ROM, Balance/vestibular training, Functional electrical stimulation, UE/LE Coordination activities, Cognitive remediation/compensation, Functional mobility training, Community reintegration, Neuromuscular re-education, IT trainer, Wheelchair propulsion/positioning, Discharge planning, Therapeutic Activities, Disease management/prevention, Barrister's clerk education, Therapeutic Exercise  OT Interventions Training and development officer, Discharge planning, Functional mobility training, Therapeutic Activities, Psychosocial support, UE/LE Coordination activities, Patient/family education, Community reintegration, Cognitive remediation/compensation, DME/adaptive equipment instruction, Pain management, UE/LE Strength taining/ROM, Therapeutic Exercise, Self Care/advanced ADL retraining, Neuromuscular re-education, Disease mangement/prevention  SLP Interventions    TR Interventions    SW/CM Interventions  Psychosocial Assessment, Pt/Family Education & Discharge Planning    Team Discharge Planning: Destination: PT-  ,OT- Home , SLP-  Projected Follow-up: PT-Home health PT, 24 hour supervision/assistance, OT-  Outpatient OT, SLP-  Projected Equipment Needs: PT-None recommended by PT, OT- To be determined, SLP-  Equipment Details: PT-reassess by D/C, OT-  Patient/family involved in discharge planning: PT- Patient,  OT-Patient, SLP-   MD ELOS: 7-10d Medical Rehab Prognosis:  Excellent Assessment: 75 y.o. right handed female with history of hypertension, hyperlipidemia, breast cancer status post resection and radiation, essential tremor, remote stroke in the past without residual deficits maintained on Plavix. Patient lives with son and family. 2 level home with bedroom upstairs. Independent prior to admission. Presented 11/14/2015 a left-sided weakness. MRI of the brain  showed acute 1 cm right corona radiata and right frontal periventricular white matter infarct. 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  MRA of the head with no emergent large vessel occlusion. TEE with EF 55-60 % no thrombus and Loop recorder placed 11/16/2015. [name] Patient did not receive TPA. Neurology consulted presently remains on Plavix for CVA prophylaxis. Subcutaneous Lovenox for DVT prophylaxis   Now requiring 24/7 Rehab RN,MD, as well as CIR level PT, OT .  Treatment team will focus on ADLs and mobility with goals set at Mod I  See Team Conference Notes for weekly updates to the plan of care

## 2015-11-20 NOTE — Progress Notes (Signed)
Patient information reviewed and entered into eRehab system by Ahlam Piscitelli, RN, CRRN, PPS Coordinator.  Information including medical coding and functional independence measure will be reviewed and updated through discharge.     Per nursing patient was given "Data Collection Information Summary for Patients in Inpatient Rehabilitation Facilities with attached "Privacy Act Statement-Health Care Records" upon admission.  

## 2015-11-20 NOTE — Progress Notes (Signed)
Occupational Therapy Session Note  Patient Details  Name: Carmen Duncan MRN: [id number] Date of Birth: 1941-03-09  Today's Date: 11/20/2015 OT Individual Time: 1500-1600 OT Individual Time Calculation (min): 60 min   Short Term Goals: Week 1:  OT Short Term Goal 1 (Week 1): STGs equal to LTGs based on ELOS  Skilled Therapeutic Interventions/Progress Updates:  Patient found seated in w/c with no complaints of pain. Pt worked on doffing non-skid socks and donning regular socks & shoes. Pt then self-propelled self from room to therapy gym, taking increased time and requiring mod to max cueing for safety and to correctly use LUE to assist with propulsion. From here, pt performed sit to/from stand from w/c with min assist. Pt sat in w/c to engage in therapeutic activity using small pegs and small peg board. During therapeutic activity focused on fine motor control/coordination and in hand manipulation. Pt stood for ~3 minutes to perform hand exercises, then requested to sit down because she felt tired.Therapist assisted pt back to room towards end of session and left pt seated in w/c with all needs within reach.   Therapy Documentation Precautions:  Precautions Precautions: Fall Restrictions Weight Bearing Restrictions: No  Vital Signs: Therapy Vitals Temp: 98 F (36.7 C) Temp Source: Oral Pulse Rate: 68 Resp: 18 BP: (!) 161/56 mmHg Patient Position (if appropriate): Lying Oxygen Therapy SpO2: 98 % O2 Device: Not Delivered  See Function Navigator for Current Functional Status.  Therapy/Group: Individual Therapy  [name] , MS, OTR/L, CLT  11/20/2015, 4:09 PM

## 2015-11-20 NOTE — Progress Notes (Signed)
  Radiation Oncology         [phone] ________________________________  Name: Carmen Duncan MRN: [id number]  Date: 10/26/2015  DOB: 1940/08/04  End of Treatment Note  Diagnosis:   Right-sided breast cancer     Indication for treatment:  Curative       Radiation treatment dates:   09/28/2015 through 10/26/2015  Site/dose:   The patient initially received a dose of 42.5 Gy in 17 fractions to the breast using whole-breast tangent fields. This was delivered using a 3-D conformal technique. The patient then received a boost to the seroma. This delivered an additional 7.5 Gy in 3 fractions using a 3 field photon technique due to the depth of the seroma. The total dose was 50 Gy.  Narrative: The patient tolerated radiation treatment relatively well.   The patient had some expected skin irritation as she progressed during treatment. Moist desquamation was not present at the end of treatment.  Plan: The patient has completed radiation treatment. The patient will return to radiation oncology clinic for routine followup in one month. I advised the patient to call or return sooner if they have any questions or concerns related to their recovery or treatment. ________________________________  Jodelle Gross, M.D., Ph.D.

## 2015-11-20 NOTE — Progress Notes (Signed)
Subjective/Complaints: Per pt no BM for several days  ROS-no SOB, pain over loop site, last BM 1 day ago  Objective: Vital Signs: Blood pressure 161/56, pulse 68, temperature 98 F (36.7 C), temperature source Oral, resp. rate 18, height 5' 1"  (1.549 m), weight 53.7 kg (118 lb 6.2 oz), SpO2 98 %. No results found. Results for orders placed or performed during the hospital encounter of 11/17/15 (from the past 72 hour(s))  CBC     Status: None   Collection Time: 11/17/15  7:48 PM  Result Value Ref Range   WBC 4.3 4.0 - 10.5 K/uL   RBC 4.92 3.87 - 5.11 MIL/uL   Hemoglobin 14.6 12.0 - 15.0 g/dL   HCT 44.8 36.0 - 46.0 %   MCV 91.1 78.0 - 100.0 fL   MCH 29.7 26.0 - 34.0 pg   MCHC 32.6 30.0 - 36.0 g/dL   RDW 14.2 11.5 - 15.5 %   Platelets 161 150 - 400 K/uL  Creatinine, serum     Status: None   Collection Time: 11/17/15  7:48 PM  Result Value Ref Range   Creatinine, Ser 0.69 0.44 - 1.00 mg/dL   GFR calc non Af Amer >60 >60 mL/min   GFR calc Af Amer >60 >60 mL/min    Comment: (NOTE) The eGFR has been calculated using the CKD EPI equation. This calculation has not been validated in all clinical situations. eGFR's persistently <60 mL/min signify possible Chronic Kidney Disease.   CBC WITH DIFFERENTIAL     Status: Abnormal   Collection Time: 11/20/15  6:16 AM  Result Value Ref Range   WBC 3.9 (L) 4.0 - 10.5 K/uL   RBC 4.53 3.87 - 5.11 MIL/uL   Hemoglobin 13.5 12.0 - 15.0 g/dL   HCT 41.0 36.0 - 46.0 %   MCV 90.5 78.0 - 100.0 fL   MCH 29.8 26.0 - 34.0 pg   MCHC 32.9 30.0 - 36.0 g/dL   RDW 13.9 11.5 - 15.5 %   Platelets 171 150 - 400 K/uL   Neutrophils Relative % 59 %   Neutro Abs 2.3 1.7 - 7.7 K/uL   Lymphocytes Relative 21 %   Lymphs Abs 0.8 0.7 - 4.0 K/uL   Monocytes Relative 11 %   Monocytes Absolute 0.4 0.1 - 1.0 K/uL   Eosinophils Relative 8 %   Eosinophils Absolute 0.3 0.0 - 0.7 K/uL   Basophils Relative 1 %   Basophils Absolute 0.0 0.0 - 0.1 K/uL  Comprehensive  metabolic panel     Status: Abnormal   Collection Time: 11/20/15  6:16 AM  Result Value Ref Range   Sodium 140 135 - 145 mmol/L   Potassium 4.5 3.5 - 5.1 mmol/L   Chloride 107 101 - 111 mmol/L   CO2 28 22 - 32 mmol/L   Glucose, Bld 100 (H) 65 - 99 mg/dL   BUN 16 6 - 20 mg/dL   Creatinine, Ser 0.66 0.44 - 1.00 mg/dL   Calcium 9.1 8.9 - 10.3 mg/dL   Total Protein 7.0 6.5 - 8.1 g/dL   Albumin 3.1 (L) 3.5 - 5.0 g/dL   AST 23 15 - 41 U/L   ALT 20 14 - 54 U/L   Alkaline Phosphatase 69 38 - 126 U/L   Total Bilirubin 0.7 0.3 - 1.2 mg/dL   GFR calc non Af Amer >60 >60 mL/min   GFR calc Af Amer >60 >60 mL/min    Comment: (NOTE) The eGFR has been calculated using the CKD  EPI equation. This calculation has not been validated in all clinical situations. eGFR's persistently <60 mL/min signify possible Chronic Kidney Disease.    Anion gap 5 5 - 15     HEENT: normal Cardio: RRR and loop site tender but no swelling Resp: CTA B/L GI: BS positive and non tender non distended Extremity:  Pulses positive and No Edema Skin:   Intact Neuro: Alert/Oriented and Abnormal Motor 4- LUE, 4/5 LLE, 5/5 on RIght side Musc/Skel:  Normal Gen NAD   Assessment/Plan: 1. 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 Physiatrist is providing close team supervision and 24 hour management of active medical problems listed below. Physiatrist and rehab team continue to assess barriers to discharge/monitor patient progress toward functional and medical goals. FIM: Function - Bathing Position: Wheelchair/chair at sink Body parts bathed by patient: Right arm, Left arm, Chest, Abdomen, Front perineal area, Buttocks, Right upper leg, Left upper leg, Right lower leg, Left lower leg Body parts bathed by helper: Back Assist Level: Touching or steadying  assistance(Pt > 75%)  Function- Upper Body Dressing/Undressing What is the patient wearing?: Pull over shirt/dress, Bra Bra - Perfomed by patient: Thread/unthread right bra strap, Thread/unthread left bra strap, Hook/unhook bra (pull down sports bra) Pull over shirt/dress - Perfomed by patient: Thread/unthread right sleeve, Thread/unthread left sleeve, Put head through opening, Pull shirt over trunk Assist Level: Supervision or verbal cues Function - Lower Body Dressing/Undressing What is the patient wearing?: Underwear, Pants, Socks, Shoes Position: Wheelchair/chair at [employer] - Performed by patient: Thread/unthread right underwear leg, Thread/unthread left underwear leg, Pull underwear up/down Pants- Performed by patient: Thread/unthread right pants leg, Pull pants up/down, Thread/unthread left pants leg Pants- Performed by helper: Fasten/unfasten pants Socks - Performed by patient: Don/doff right sock, Don/doff left sock Shoes - Performed by patient: Don/doff left shoe, Don/doff right shoe Shoes - Performed by helper: Fasten right, Fasten left  Function - Toileting Toileting steps completed by patient: Adjust clothing prior to toileting, Performs perineal hygiene, Adjust clothing after toileting     Function - Chair/bed transfer Chair/bed transfer assist level: Touching or steadying assistance (Pt > 75%)  Function - Locomotion: Wheelchair Will patient use wheelchair at discharge?: Yes Type: Manual Max wheelchair distance: 20 Assist Level: Moderate assistance (Pt 50 - 74%) Wheel 50 feet with 2 turns activity did not occur: Safety/medical concerns Wheel 150 feet activity did not occur: Safety/medical concerns Turns around,maneuvers to table,bed, and toilet,negotiates 3% grade,maneuvers on rugs and over doorsills: Yes Function - Locomotion: Ambulation Assistive device: No device Max distance: 120 Assist level: Touching or steadying assistance (Pt > 75%) Assist level:  Touching or steadying assistance (Pt > 75%) Assist level: Touching or steadying assistance (Pt > 75%) Assist level: Touching or steadying assistance (Pt > 75%) Assist level: Touching or steadying assistance (Pt > 75%)  Function - Comprehension Comprehension: Auditory Comprehension assist level: Follows complex conversation/direction with extra time/assistive device  Function - Expression Expression: Verbal Expression assist level: Expresses complex ideas: With extra time/assistive device  Function - Social Interaction Social Interaction assist level: Interacts appropriately 90% of the time - Needs monitoring or encouragement for participation or interaction.  Function - Problem Solving Problem solving assist level: Solves complex problems: With extra time  Function - Memory Memory assist level: More than reasonable amount of time Patient normally able to recall (first 3 days only): Current season,  Location of own room, Staff names and faces, That he or she is in a hospital     Medical Problem List and Plan: 1. Left hemiplegia secondary to right corona radiata and lenticulostriate infarct.S/P loop recorder, likely thrombotic -PT,OT SLP , CIR level 2. DVT Prophylaxis/Anticoagulation: Subcutaneous Lovenox. Monitor platelet counts and for any signs of bleeding 3. Pain Management: Tylenol as needed. No pain at this time 4. Hypertension. Permissive hypertension for perfusion. Monitor BP with increased mobility, elevated systolic 5. Neuropsych: This patient is capable of making decisions on her own behalf. 6. Skin/Wound Care: Routine skin checks 7. Fluids/Electrolytes/Nutrition: Routine I&O's with follow-up chemistries, lytes normal, low alb 8. Hyperlipidemia. Lipitor 9. Irritable bowel syndrome. Continue Librax, no BM for several days will need laxatives 10. Essential tremor. Patient on Inderal 20 mg daily prior to admission. Resume as needed , BP should tolerate no sig  tremor noted today 11. Asthma. Continue [name] 100 mg twice a day 12.  Hypoalbuminemia- add prostat LOS (Days) 3 A FACE TO FACE EVALUATION WAS PERFORMED  KIRSTEINS,ANDREW E 11/20/2015, 7:46 AM

## 2015-11-20 NOTE — Progress Notes (Signed)
Physical Therapy Session Note  Patient Details  Name: Carmen Duncan MRN: [id number] Date of Birth: Jul 03, 1940  Today's Date: 11/20/2015 PT Individual Time: 0800-0900 PT Individual Time Calculation (min): 60 min   Short Term Goals: Week 1:  PT Short Term Goal 1 (Week 1): Pt will perfom bed mobility with SBA and LRAD PT Short Term Goal 2 (Week 1): Pt will perform transfers with SBA and LRAD PT Short Term Goal 3 (Week 1): Pt will perform 150' gait with SBA and LRAD PT Short Term Goal 4 (Week 1): Pt will perform 4 stairs with SBA and LRAD  Skilled Therapeutic Interventions/Progress Updates:    Pt received supine in bed, denies pain and agreeable to treatment. Supine>sit with S and HOB elevated. Stand pivot transfer bed >recliner with min guard. Completed self-feeding with mod I. Gait into/out of bathroom with min guard and "furniture walking" with pt declining HHA. Pt completed showering with shower chair and close S; use of grab bars for balance while standing. Dressing performed with standbyA for sit <>stand and increased time due to reduced LUE/LLE coordination. Toileted with S and grab bars. Note significant L lateral lean in sitting and standing, however no LOB during functional activities. Performed washing face, brushing teeth standing at sink with S. Education throughout session regarding therapy schedule, goals, estimated LOS, and falls prevention safety with recommendation that pt have staff present prior to getting up; pt agreeable. Remained seated in recliner at completion of session, all needs in reach and pt reminded to call for staff assist prior to getting up.   Therapy Documentation Precautions:  Precautions Precautions: Fall Restrictions Weight Bearing Restrictions: No Pain: Pain Assessment Pain Assessment: No/denies pain   See Function Navigator for Current Functional Status.   Therapy/Group: Individual Therapy  [name] 11/20/2015, 9:18 AM

## 2015-11-20 NOTE — Progress Notes (Signed)
Recreational Therapy Session Note  Patient Details  Name: Carmen Duncan MRN: [id number] Date of Birth: 02-Mar-1941 Today's Date: 11/20/2015  Pt participated in animal assisted activity/therapy with supervision seated w/c level, sister present and participatory.   [place] 11/20/2015, 3:51 PM

## 2015-11-21 ENCOUNTER — Inpatient Hospital Stay (HOSPITAL_COMMUNITY): Payer: Medicare Other | Admitting: *Deleted

## 2015-11-21 ENCOUNTER — Inpatient Hospital Stay (HOSPITAL_COMMUNITY): Payer: Medicare Other

## 2015-11-21 ENCOUNTER — Inpatient Hospital Stay (HOSPITAL_COMMUNITY): Payer: Medicare Other | Admitting: Occupational Therapy

## 2015-11-21 NOTE — Progress Notes (Signed)
Physical Therapy Session Note  Patient Details  Name: Carmen Duncan MRN: [id number] Date of Birth: January 15, 1941  Today's Date: 11/21/2015 PT Individual Time: 0830-0900 PT Individual Time Calculation (min): 30 min   Short Term Goals: Week 1:  PT Short Term Goal 1 (Week 1): Pt will perfom bed mobility with SBA and LRAD PT Short Term Goal 2 (Week 1): Pt will perform transfers with SBA and LRAD PT Short Term Goal 3 (Week 1): Pt will perform 150' gait with SBA and LRAD PT Short Term Goal 4 (Week 1): Pt will perform 4 stairs with SBA and LRAD  Skilled Therapeutic Interventions/Progress Updates:    Patient seen for 1:1 session with focus on gait, L hip activation in stance/awareness and strengthening.  Patient ambulated to therapy gym with HHA on R and cues for L hip stability in stance x 200'.  Patient c/o fatigue w/ HR 99, SpO2 98%.  Standing step taps with L to 2" step with L UE support on rail and min A for balance/facilitation for L hip/trunk stabilization.  Negotiated 8, 3" steps with railings and min A with step through technique.  Patient sit to R sidelying on mat supervision to perform L hip abduction with leg extended x 10, then L hip clamshell abduction/ER with yellow t-band x 2 sets of 10.  Patient side to sit S and stood with minguard A to ambulate 200' back to room with RW and min/mod support for keeping centered in walker and for L hip stabilization in stance and protraction in swing. Patient to supine in bed with HOB up min A and assist to scoot to [facility].  Left with all needs in reach and bed alarm on.  Therapy Documentation Precautions:  Precautions Precautions: Fall Restrictions Weight Bearing Restrictions: No Pain: Pain Assessment Pain Assessment: No/denies pain    See Function Navigator for Current Functional Status.   Therapy/Group: Individual Therapy  [place], [place] 11/21/2015  11/21/2015, 8:50 AM

## 2015-11-21 NOTE — Progress Notes (Signed)
Subjective/Complaints: No new issues overnite BM x2 yest  ROS-no SOB, pain over loop site,no N/V/D  Objective: Vital Signs: Blood pressure 135/74, pulse 87, temperature 97.4 F (36.3 C), temperature source Oral, resp. rate 16, height '5\' 1"'$  (1.549 m), weight 53.7 kg (118 lb 6.2 oz), SpO2 100 %. No results found. Results for orders placed or performed during the hospital encounter of 11/17/15 (from the past 72 hour(s))  CBC WITH DIFFERENTIAL     Status: Abnormal   Collection Time: 11/20/15  6:16 AM  Result Value Ref Range   WBC 3.9 (L) 4.0 - 10.5 K/uL   RBC 4.53 3.87 - 5.11 MIL/uL   Hemoglobin 13.5 12.0 - 15.0 g/dL   HCT 41.0 36.0 - 46.0 %   MCV 90.5 78.0 - 100.0 fL   MCH 29.8 26.0 - 34.0 pg   MCHC 32.9 30.0 - 36.0 g/dL   RDW 13.9 11.5 - 15.5 %   Platelets 171 150 - 400 K/uL   Neutrophils Relative % 59 %   Neutro Abs 2.3 1.7 - 7.7 K/uL   Lymphocytes Relative 21 %   Lymphs Abs 0.8 0.7 - 4.0 K/uL   Monocytes Relative 11 %   Monocytes Absolute 0.4 0.1 - 1.0 K/uL   Eosinophils Relative 8 %   Eosinophils Absolute 0.3 0.0 - 0.7 K/uL   Basophils Relative 1 %   Basophils Absolute 0.0 0.0 - 0.1 K/uL  Comprehensive metabolic panel     Status: Abnormal   Collection Time: 11/20/15  6:16 AM  Result Value Ref Range   Sodium 140 135 - 145 mmol/L   Potassium 4.5 3.5 - 5.1 mmol/L   Chloride 107 101 - 111 mmol/L   CO2 28 22 - 32 mmol/L   Glucose, Bld 100 (H) 65 - 99 mg/dL   BUN 16 6 - 20 mg/dL   Creatinine, Ser 0.66 0.44 - 1.00 mg/dL   Calcium 9.1 8.9 - 10.3 mg/dL   Total Protein 7.0 6.5 - 8.1 g/dL   Albumin 3.1 (L) 3.5 - 5.0 g/dL   AST 23 15 - 41 U/L   ALT 20 14 - 54 U/L   Alkaline Phosphatase 69 38 - 126 U/L   Total Bilirubin 0.7 0.3 - 1.2 mg/dL   GFR calc non Af Amer >60 >60 mL/min   GFR calc Af Amer >60 >60 mL/min    Comment: (NOTE) The eGFR has been calculated using the CKD EPI equation. This calculation has not been validated in all clinical situations. eGFR's persistently  <60 mL/min signify possible Chronic Kidney Disease.    Anion gap 5 5 - 15     HEENT: normal Cardio: RRR and loop site tender but no swelling Resp: CTA B/L GI: BS positive and non tender non distended Extremity:  Pulses positive and No Edema Skin:   Intact Neuro: Alert/Oriented and Abnormal Motor 4- LUE, 4/5 LLE, 5/5 on RIght side Musc/Skel:  Normal Gen NAD   Assessment/Plan: 1. 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 Physiatrist is providing close team supervision and 24 hour management of active medical problems listed below. Physiatrist and rehab team continue to assess barriers to discharge/monitor patient progress toward functional and medical goals. FIM: Function - Bathing Position: Shower (with shower chair) Body parts bathed by patient: Right arm, Left arm, Chest, Abdomen, Front perineal area, Buttocks, Right upper leg, Left upper leg, Right  lower leg, Left lower leg, Back Body parts bathed by helper: Back Assist Level: Touching or steadying assistance(Pt > 75%)  Function- Upper Body Dressing/Undressing What is the patient wearing?: Pull over shirt/dress, Bra Bra - Perfomed by patient: Thread/unthread right bra strap, Thread/unthread left bra strap, Hook/unhook bra (pull down sports bra) Pull over shirt/dress - Perfomed by patient: Thread/unthread right sleeve, Thread/unthread left sleeve, Put head through opening, Pull shirt over trunk Assist Level: Supervision or verbal cues Function - Lower Body Dressing/Undressing What is the patient wearing?: Pants, Non-skid slipper socks, Underwear Position: Wheelchair/chair at sink Underwear - Performed by patient: Thread/unthread right underwear leg, Thread/unthread left underwear leg, Pull underwear up/down Pants- Performed by patient: Thread/unthread right  pants leg, Pull pants up/down, Thread/unthread left pants leg Pants- Performed by helper: Fasten/unfasten pants Non-skid slipper socks- Performed by patient: Don/doff right sock, Don/doff left sock Socks - Performed by patient: Don/doff right sock, Don/doff left sock Shoes - Performed by patient: Don/doff left shoe, Don/doff right shoe Shoes - Performed by helper: Fasten right, Fasten left Assist for footwear: Setup Assist for lower body dressing: Supervision or verbal cues  Function - Toileting Toileting steps completed by patient: Adjust clothing prior to toileting, Performs perineal hygiene, Adjust clothing after toileting Toileting Assistive Devices: Grab bar or rail Assist level: Supervision or verbal cues  Function - Air cabin crew transfer assistive device: Grab bar Assist level to toilet: Touching or steadying assistance (Pt > 75%) Assist level from toilet: Touching or steadying assistance (Pt > 75%)  Function - Chair/bed transfer Chair/bed transfer method: Stand pivot, Ambulatory Chair/bed transfer assist level: Touching or steadying assistance (Pt > 75%) Chair/bed transfer assistive device: Armrests, Bedrails Chair/bed transfer details: Verbal cues for precautions/safety  Function - Locomotion: Wheelchair Will patient use wheelchair at discharge?: Yes Type: Manual Max wheelchair distance: 20 Assist Level: Moderate assistance (Pt 50 - 74%) Wheel 50 feet with 2 turns activity did not occur: Safety/medical concerns Wheel 150 feet activity did not occur: Safety/medical concerns Turns around,maneuvers to table,bed, and toilet,negotiates 3% grade,maneuvers on rugs and over doorsills: Yes Function - Locomotion: Ambulation Assistive device: No device Max distance: 25 Assist level: Touching or steadying assistance (Pt > 75%) Assist level: Touching or steadying assistance (Pt > 75%) Assist level: Touching or steadying assistance (Pt > 75%) Assist level: Touching or  steadying assistance (Pt > 75%) Assist level: Touching or steadying assistance (Pt > 75%)  Function - Comprehension Comprehension: Auditory Comprehension assist level: Follows complex conversation/direction with extra time/assistive device  Function - Expression Expression: Verbal Expression assist level: Expresses complex ideas: With extra time/assistive device  Function - Social Interaction Social Interaction assist level: Interacts appropriately 90% of the time - Needs monitoring or encouragement for participation or interaction.  Function - Problem Solving Problem solving assist level: Solves complex problems: With extra time  Function - Memory Memory assist level: More than reasonable amount of time Patient normally able to recall (first 3 days only): Current season, Location of own room, Staff names and faces, That he or she is in a hospital     Medical Problem List and Plan: 1. 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 DVT Prophylaxis/Anticoagulation: Subcutaneous Lovenox. Monitor platelet counts and for any signs of bleeding 3. Pain Management: Tylenol as needed. No pain at this time 4. Hypertension. Permissive hypertension for perfusion. Monitor BP with increased mobility, elevated systolic Filed Vitals:   62/83/15  1258 11/21/15 0552  BP: 144/66 135/74  Pulse: 81 87  Temp: 98.2 F (36.8 C) 97.4 F (36.3 C)  Resp: 16 16   5. Neuropsych: This patient is capable of making decisions on her own behalf. 6. Skin/Wound Care: Routine skin checks 7. Fluids/Electrolytes/Nutrition: Routine I&O's with follow-up chemistries, lytes normal, low alb 8. Hyperlipidemia. Lipitor 9. Irritable bowel syndrome. Continue Librax, no BM for several days will need laxatives                                                                                                                                                                                                                                                                                                   10. Essential tremor. Patient on Inderal 20 mg daily prior to admission. Resume as needed , BP should tolerate no sig tremor noted today 11. Asthma. Continue [name] 100 mg twice a day 12.  Hypoalbuminemia- add prostat LOS (Days) 4 A FACE TO FACE EVALUATION WAS PERFORMED  Greysen Swanton E 11/21/2015, 8:11 AM

## 2015-11-21 NOTE — Progress Notes (Signed)
Recreational Therapy Session Note  Patient Details  Name: Carmen Duncan MRN: [id number] Date of Birth: [birth year] Today's Date: 11/21/2015  Pain: no c/o Pt referred for community reintegration/outing today.  Discussed purpose & potential goals with pt, pt agreeable to participate.  Pt participated in community lunch outing to [employer] at overall min assist ambulatory level without AD. Goals focused on safe functional mobility on various surface types, identification & negotiation of obstacles, public restroom access & energy conservation.  See goal sheet in shadow chart for full details. [place] 11/21/2015, 9:08 AM

## 2015-11-21 NOTE — Progress Notes (Signed)
Occupational Therapy Session Note  Patient Details  Name: Carmen Duncan MRN: [id number] Date of Birth: 01/30/1941  Today's Date: 11/21/2015 OT Individual Time: 1015-1100 OT Individual Time Calculation (min): 45 min    Short Term Goals: Week 1:  OT Short Term Goal 1 (Week 1): STGs equal to LTGs based on ELOS  Skilled Therapeutic Interventions/Progress Updates:    Pt seen for skilled OT to facilitate dynamic balance, L side awareness with ADL retraining. Pt stated she has been using the RW to ambulate to the therapy gym but no AD in the room with the staff.  Pt got out of bed to gather clothing and complete grooming standing at the sink. Touching A to ambulate to toilet and to shower. After shower, pt got out and demonstrated a stronger L lean with decreased awareness.  She was needing min A to support balance. Sat to EOB to don socks but was leaning and in an unsafe position. Pt transferred to arm chair with better support and was then able to don shoes and tie them. In standing, pt leaning to L when trying to fasten pants. Gave pt cues to find midline and use visual references. Recommended to pt that we should use RW in room to increase safety with mobility. Pt in agreement. Pt's next therapist arrived for her next session.  Therapy Documentation Precautions:  Precautions Precautions: Fall Restrictions Weight Bearing Restrictions: No   Pain: Pain Assessment Pain Assessment: No/denies pain   ADL:  See Function Navigator for Current Functional Status.   Therapy/Group: Individual Therapy  [place] 11/21/2015, 12:20 PM

## 2015-11-21 NOTE — Progress Notes (Signed)
Social Work  Social Work Assessment and Plan  Patient Details  Name: Carmen Duncan MRN: [id number] Date of Birth: 1940-10-08  Today's Date: 11/21/2015  Problem List:  Patient Active Problem List   Diagnosis Date Noted  . CVA (cerebral infarction) 11/17/2015  . Left hemiparesis ([place])   . Acute CVA (cerebrovascular accident) ([place]) 11/15/2015  . CVA (cerebral vascular accident) ([place]) 11/14/2015  . Left-sided muscle weakness 11/14/2015  . Left-sided weakness 11/14/2015  . Breast cancer of upper-outer quadrant of right female breast ([place]) 07/19/2015  . B12 deficiency 03/14/2015  . Bilateral carotid artery disease ([place]) 10/07/2014  . Pulmonary hypertension ([place]) 10/07/2014  . Ejection fraction   . 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 Oral candidiasis 04/28/2014  . Chronic diastolic congestive heart failure ([place]) 04/28/2014  . IPF (idiopathic pulmonary fibrosis) ([place]) 04/21/2014  . Hyponatremia 04/19/2014  . Thrombocytopenia ([place]) 04/19/2014  . Malnutrition of moderate degree ([place]) 04/19/2014  . Hypertension 04/18/2014  . Hyperlipidemia 04/18/2014  . Hypovolemia due to dehydration 04/18/2014  . Dyspnea on exertion 04/04/2014  . Chest rales 04/04/2014  . Abnormal nuclear stress test 04/04/2014  . HOH (hard of hearing)   . Raynaud's phenomenon 03/21/2014  . Tremor, essential 03/12/2013  . Unspecified hereditary and idiopathic peripheral neuropathy 08/04/2012  . Squamous cell skin cancer 08/19/2011  . 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 Dyslipidemia 04/03/2010  . Essential hypertension 04/03/2010  . GERD 04/03/2010  . IBS 04/03/2010  . DEEP VENOUS THROMBOPHLEBITIS, HX OF 04/03/2010  . DIVERTICULITIS, HX OF 04/03/2010   Past Medical History:  Past Medical History  Diagnosis Date  . Arthritis   . Asthma   . Hypertension   . GERD (gastroesophageal reflux disease)   . Hyperlipidemia   . Diverticulosis   . IBS (irritable bowel syndrome)   . HOH (hard of hearing) left ear  .  Acoustic neuroma ([place])   . Ejection fraction   . Breast cancer of upper-outer quadrant of right female breast ([place]) 07/19/2015  . Brain cancer ([place])   . Anxiety   . Stroke [facility])     no deficits, on plavix  . Essential tremor     on inderal  . Pneumonia     04-2014, CAP  . Interstitial lung disease [facility])    Past Surgical History:  Past Surgical History  Procedure Laterality Date  . Cholecystectomy  2011  . Abdominal hysterectomy  1980  . Tonsillectomy  1945  . Biopsy breast    . Eyes  2007    cararacts  . Tee without cardioversion N/A 09/05/2014    Procedure: TRANSESOPHAGEAL ECHOCARDIOGRAM (TEE);  Surgeon: Lelon Perla, MD;  Location: [facility] ENDOSCOPY;  Service: Cardiovascular;  Laterality: N/A;  . Breast lumpectomy with radioactive seed and sentinel lymph node biopsy Right 08/10/2015    Procedure: BREAST LUMPECTOMY WITH RADIOACTIVE SEED AND SENTINEL LYMPH NODE BIOPSY;  Surgeon: Autumn Messing III, MD;  Location: [facility];  Service: General;  Laterality: Right;  . Ep implantable device N/A 11/16/2015    Procedure: Loop Recorder Insertion;  Surgeon: [name], MD;  Location: [facility] CV LAB;  Service: Cardiovascular;  Laterality: N/A;  . Tee without cardioversion N/A 11/16/2015    Procedure: TRANSESOPHAGEAL ECHOCARDIOGRAM (TEE);  Surgeon: Larey Dresser, MD;  Location: [facility];  Service: Cardiovascular;  Laterality: N/A;   Social History:  reports that she has never smoked. She has never used smokeless tobacco. She reports that she drinks alcohol. She reports that she does not use  illicit drugs.  Family / Support Systems Marital Status: Widow/Widower Patient Roles: Parent, Other (Comment) (Grandparent) Children: Josephina Gip [phone]  [phone] Other Supports: Donna-daughter in-law [phone] Anticipated Caregiver: Son and daughter in-law Ability/Limitations of Caregiver: Both work and can not provide 24 hr care Caregiver  Availability: Intermittent Family Dynamics: Close knit family who are supportive of one another. Pt lives with son and son's family and all get along well. Pt was independent until this stroke and prides hersle fon being able to do for herself.  Social History Preferred language: English Religion: Methodist Cultural Background: No issues Education: [employer] Read: Yes Write: Yes Employment Status: Retired Freight forwarder Issues: No issues Guardian/Conservator: None-according to MD pt is capable of making her own decisions while here.   Abuse/Neglect Physical Abuse: Denies Verbal Abuse: Denies Sexual Abuse: Denies Exploitation of patient/patient's resources: Denies Self-Neglect: Denies  Emotional Status Pt's affect, behavior adn adjustment status: Pt is motivated to do well and regain her independence while here, so when she is discharged she will not rwequire any assistance. She is worried aobut the steps to the upstairs and the bedrooms. Aware team is working on this with her in therpaies.  Recent Psychosocial Issues: other medical issues were managed but has had many this year Pyschiatric History: No history deferred depression screen due to relies upon her faith and feels whatever will be will be. She seems to be coping appropriately and open about discussing her issues. Substance Abuse History: No issues  Patient / Family Perceptions, Expectations & Goals Pt/Family understanding of illness & functional limitations: Pt has a good understanding of her stroke and deficits, she has made good progress and does speak with MD on his daily rounding. She feels her questions are being addressed and her son is a pharmist here and updated regarding her progress and medical issues. Premorbid pt/family roles/activities: Mother, grandmother, retiree, church member, etc Anticipated changes in roles/activities/participation: resume Pt/family expectations/goals: Pt states: " I want to  be able to take care of myself before I leave here."  Son states: " We will help but work so she needs to be safe at home alone while we are gone."  [employer]: None Premorbid Home Care/DME Agencies: None Transportation available at discharge: Family Resource referrals recommended: Support group (specify)  Discharge Planning Living Arrangements: Children Support Systems: Children, Other relatives, Water engineer, Social worker community Type of Residence: Private residence Insurance underwriter Resources: Multimedia programmer (specify) Psychologist, prison and probation services) Museum/gallery curator Resources: Fish farm manager, Family Support Financial Screen Referred: No Living Expenses: Lives with family Money Management: Family Does the patient have any problems obtaining your medications?: No Home Management: Daughter in-law and pt does help some Patient/Family Preliminary Plans: Return home with son and his family, needs to be mod/i due to home alone while both son and daugther in-law work. Will await team's evaluations and work on a safe discharge plan. Need to focus on the steps and getting upstairs at son's home. Social Work Anticipated Follow Up Needs: HH/OP, Support Group  Clinical Impression Pleasant female who is motivated and has always prided herself on her independence. She is hoping she will get there while here and be able to maneuver the stairs at her son's home before she leaves here. Will have team conference tomorrow and work on safe discharge plan.   Elease Hashimoto 11/21/2015, 2:07 PM

## 2015-11-21 NOTE — Progress Notes (Signed)
Physical Therapy Session Note  Patient Details  Name: Carmen Duncan MRN: [id number] Date of Birth: Jan 27, 1941  Today's Date: 11/21/2015 PT Individual time 1100-1240 PT Individual minutes: 100 min Short Term Goals: Week 1:  PT Short Term Goal 1 (Week 1): Pt will perfom bed mobility with SBA and LRAD PT Short Term Goal 2 (Week 1): Pt will perform transfers with SBA and LRAD PT Short Term Goal 3 (Week 1): Pt will perform 150' gait with SBA and LRAD PT Short Term Goal 4 (Week 1): Pt will perform 4 stairs with SBA and LRAD  Skilled Therapeutic Interventions/Progress Updates:    Pt participated in community lunch outing to [employer] at overall min assist ambulatory level without AD. Goals focused on safe functional mobility on various surface types, identification & negotiation of obstacles, public restroom access & energy conservation.Pt verbalized concerns about memory impairments since recent stroke; will follow up with PA for ST consult. See goal sheet in shadow chart for full details.  Therapy Documentation Precautions:  Precautions Precautions: Fall Restrictions Weight Bearing Restrictions: No   See Function Navigator for Current Functional Status.   Therapy/Group: Co-Treatment  Benjiman Core Tygielski 11/21/2015, 1:26 PM

## 2015-11-21 NOTE — Telephone Encounter (Signed)
RA please advise 

## 2015-11-21 NOTE — Care Management Note (Signed)
[facility] Individual Statement of Services  Patient Name:  Carmen Duncan  Date:  11/21/2015  Welcome to the [facility].  Our goal is to provide you with an individualized program based on your diagnosis and situation, designed to meet your specific needs.  With this comprehensive rehabilitation program, you will be expected to participate in at least 3 hours of rehabilitation therapies Monday-Friday, with modified therapy programming on the weekends.  Your rehabilitation program will include the following services:  Physical Therapy (PT), Occupational Therapy (OT), Speech Therapy (ST), 24 hour per day rehabilitation nursing, Case Management (Social Worker), Rehabilitation Medicine, Nutrition Services and Pharmacy Services  Weekly team conferences will be held on Wednesday to discuss your progress.  Your Social Worker will talk with you frequently to get your input and to update you on team discussions.  Team conferences with you and your family in attendance may also be held.  Expected length of stay: 7-9 days  Overall anticipated outcome: mod/i level  Depending on your progress and recovery, your program may change. Your Social Worker will coordinate services and will keep you informed of any changes. Your Social Worker's name and contact numbers are listed  below.  The following services may also be recommended but are not provided by the [facility] will be made to provide these services after discharge if needed.  Arrangements include referral to agencies that provide these services.  Your insurance has been verified to be:  [employer] Your primary doctor is:  Programmer, applications  Pertinent information will be shared with your doctor and your insurance company.  Social Worker:  Ovidio Kin, [place] or  ([phone]  Information discussed with and copy given to patient by: Elease Hashimoto, 11/21/2015, 9:45 AM

## 2015-11-22 ENCOUNTER — Telehealth: Payer: Self-pay | Admitting: Pulmonary Disease

## 2015-11-22 ENCOUNTER — Inpatient Hospital Stay (HOSPITAL_COMMUNITY): Payer: Medicare Other | Admitting: Speech Pathology

## 2015-11-22 ENCOUNTER — Inpatient Hospital Stay (HOSPITAL_COMMUNITY): Payer: Medicare Other | Admitting: Occupational Therapy

## 2015-11-22 ENCOUNTER — Ambulatory Visit: Admission: RE | Admit: 2015-11-22 | Payer: Medicare Other | Source: Ambulatory Visit | Admitting: Radiation Oncology

## 2015-11-22 ENCOUNTER — Inpatient Hospital Stay (HOSPITAL_COMMUNITY): Payer: Medicare Other | Admitting: Physical Therapy

## 2015-11-22 MED ORDER — ACETAMINOPHEN 325 MG PO TABS
325.0000 mg | ORAL_TABLET | Freq: Four times a day (QID) | ORAL | Status: DC | PRN
  Administered 2015-11-22: 325 mg via ORAL
  Administered 2015-11-24 – 2015-11-25 (×2): 650 mg via ORAL
  Filled 2015-11-22 (×2): qty 2

## 2015-11-22 NOTE — Patient Care Conference (Signed)
Inpatient RehabilitationTeam Conference and Plan of Care Update Date: 11/22/2015   Time: 11:00 AM    Patient Name: ANIIYA Duncan      Medical Record Number: [id number]  Date of Birth: 03/17/1941 Sex: Female         Room/Bed: 4M11C/4M11C-01 Payor Info: Payor: [place] / Plan: BCBS MEDICARE / Product Type: *No Product type* /    Admitting Diagnosis: CVA  Admit Date/Time:  11/17/2015  6:11 PM Admission Comments: No comment available   Primary Diagnosis:  Cryptogenic stroke ([place]) Principal Problem: Cryptogenic stroke [facility])  Patient Active Problem List   Diagnosis Date Noted  . CVA (cerebral infarction) 11/17/2015  . Left hemiparesis ([place])   . Acute CVA (cerebrovascular accident) ([place]) 11/15/2015  . CVA (cerebral vascular accident) ([place]) 11/14/2015  . Left-sided muscle weakness 11/14/2015  . Left-sided weakness 11/14/2015  . Breast cancer of upper-outer quadrant of right female breast ([place]) 07/19/2015  . B12 deficiency 03/14/2015  . Bilateral carotid artery disease ([place]) 10/07/2014  . Pulmonary hypertension ([place]) 10/07/2014  . Ejection fraction   . Cryptogenic stroke ([place]) 10/04/2014  . Oral candidiasis 04/28/2014  . Chronic diastolic congestive heart failure ([place]) 04/28/2014  . IPF (idiopathic pulmonary fibrosis) ([place]) 04/21/2014  . Hyponatremia 04/19/2014  . Thrombocytopenia ([place]) 04/19/2014  . Malnutrition of moderate degree ([place]) 04/19/2014  . Hypertension 04/18/2014  . Hyperlipidemia 04/18/2014  . Hypovolemia due to dehydration 04/18/2014  . Dyspnea on exertion 04/04/2014  . Chest rales 04/04/2014  . Abnormal nuclear stress test 04/04/2014  . HOH (hard of hearing)   . Raynaud's phenomenon 03/21/2014  . Tremor, essential 03/12/2013  . Unspecified hereditary and idiopathic peripheral neuropathy 08/04/2012  . Squamous cell skin cancer 08/19/2011  . 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 Dyslipidemia 04/03/2010  . Essential hypertension 04/03/2010  . GERD  04/03/2010  . IBS 04/03/2010  . DEEP VENOUS THROMBOPHLEBITIS, HX OF 04/03/2010  . DIVERTICULITIS, HX OF 04/03/2010    Expected Discharge Date: Expected Discharge Date: 11/29/15  Team Members Present: Physician leading conference: Dr. Alysia Penna Social Worker Present: Ovidio Kin, LCSW Nurse Present: Junius Creamer, RN PT Present: Kem Parkinson, PT OT Present: Willeen Cass, OT SLP Present: Windell Moulding, SLP PPS Coordinator present : Daiva Nakayama, RN, CRRN     Current Status/Progress Goal Weekly Team Focus  Medical   Standing balance improving Bowel and bladder function improved  Maintain medical stability during rehabilitation stay  Discharge planning   Bowel/Bladder   continent of bowel and bladder last bm 6/26.  remain continent of b/b  mointor q shift and prn   Swallow/Nutrition/ Hydration             ADL's   close S with ADLS, min A with balance and transfers  mod I, except for S to shower stall  ADL retraining, functional mobility training, NMR, pt/family education   Mobility   S bed mobility, minA transfers/gait/stairs  modI transfers, S ambulation in home and community  dynamic standing balance, gait training, LLE NMR, activity tolerance   Communication   pending ST evaluation          Safety/Cognition/ Behavioral Observations  pending ST evaluation          Pain   c/o headache this shift.   pain less than 2  monitor for pain q shift and prn   Skin    Bruising to L chest and L arm.  no new skin breakdown.  monitor skin daily prn      *  See Care Plan and progress notes for long and short-term goals.  Barriers to Discharge: We'll need supervision for ambulation    Possible Resolutions to Barriers:  Caregiver training    Discharge Planning/Teaching Needs:  Home with son and his family needs to be mod/i level due to both son and daughter in-law work      Team Discussion:  Goals mod/i level-PT feels she will be safe for a short time on her own. Need  supervision with stair climbing and tub bathing. Nauseous today-RN working on. Leans to the left which pt is aware and PT reports she is improving with this daily.   Revisions to Treatment Plan:  DC 7/5   Continued Need for Acute Rehabilitation Level of Care: The patient requires daily medical management by a physician with specialized training in physical medicine and rehabilitation for the following conditions: Daily direction of a multidisciplinary physical rehabilitation program to ensure safe treatment while eliciting the highest outcome that is of practical value to the patient.: Yes Daily medical management of patient stability for increased activity during participation in an intensive rehabilitation regime.: Yes Daily analysis of laboratory values and/or radiology reports with any subsequent need for medication adjustment of medical intervention for : Neurological problems  Brielyn Bosak, Gardiner Rhyme 11/22/2015, 2:23 PM

## 2015-11-22 NOTE — Evaluation (Signed)
Speech Language Pathology Assessment and Plan  Patient Details  Name: Carmen Duncan MRN: [id number] Date of Birth: 1940/12/31  SLP Diagnosis: Cognitive Impairments  Rehab Potential: Excellent ELOS: anticipated d/c date of 7/5    Today's Date: 11/22/2015 SLP Individual Time: 1300-1400 SLP Individual Time Calculation (min): 60 min   Problem List:  Patient Active Problem List   Diagnosis Date Noted  . CVA (cerebral infarction) 11/17/2015  . Left hemiparesis ([place])   . Acute CVA (cerebrovascular accident) ([place]) 11/15/2015  . CVA (cerebral vascular accident) ([place]) 11/14/2015  . Left-sided muscle weakness 11/14/2015  . Left-sided weakness 11/14/2015  . Breast cancer of upper-outer quadrant of right female breast ([place]) 07/19/2015  . B12 deficiency 03/14/2015  . Bilateral carotid artery disease ([place]) 10/07/2014  . Pulmonary hypertension ([place]) 10/07/2014  . Ejection fraction   . Cryptogenic stroke ([place]) 10/04/2014  . Oral candidiasis 04/28/2014  . Chronic diastolic congestive heart failure ([place]) 04/28/2014  . IPF (idiopathic pulmonary fibrosis) ([place]) 04/21/2014  . Hyponatremia 04/19/2014  . Thrombocytopenia ([place]) 04/19/2014  . Malnutrition of moderate degree ([place]) 04/19/2014  . Hypertension 04/18/2014  . Hyperlipidemia 04/18/2014  . Hypovolemia due to dehydration 04/18/2014  . Dyspnea on exertion 04/04/2014  . Chest rales 04/04/2014  . Abnormal nuclear stress test 04/04/2014  . HOH (hard of hearing)   . Raynaud's phenomenon 03/21/2014  . Tremor, essential 03/12/2013  . Unspecified hereditary and idiopathic peripheral neuropathy 08/04/2012  . Squamous cell skin cancer 08/19/2011  . 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 Dyslipidemia 04/03/2010  . Essential hypertension 04/03/2010  . GERD 04/03/2010  . IBS 04/03/2010  . DEEP VENOUS THROMBOPHLEBITIS, HX OF 04/03/2010  . DIVERTICULITIS, HX OF 04/03/2010   Past Medical History:  Past Medical History  Diagnosis Date  . Arthritis   .  Asthma   . Hypertension   . GERD (gastroesophageal reflux disease)   . Hyperlipidemia   . Diverticulosis   . IBS (irritable bowel syndrome)   . HOH (hard of hearing) left ear  . Acoustic neuroma ([place])   . Ejection fraction   . Breast cancer of upper-outer quadrant of right female breast ([place]) 07/19/2015  . Brain cancer ([place])   . Anxiety   . Stroke [facility])     no deficits, on plavix  . Essential tremor     on inderal  . Pneumonia     04-2014, CAP  . Interstitial lung disease [facility])    Past Surgical History:  Past Surgical History  Procedure Laterality Date  . Cholecystectomy  2011  . Abdominal hysterectomy  1980  . Tonsillectomy  1945  . Biopsy breast    . Eyes  2007    cararacts  . Tee without cardioversion N/A 09/05/2014    Procedure: TRANSESOPHAGEAL ECHOCARDIOGRAM (TEE);  Surgeon: Lelon Perla, MD;  Location[facility] ENDOSCOPY;  Service: Cardiovascular;  Laterality: N/A;  . Breast lumpectomy with radioactive seed and sentinel lymph node biopsy Right 08/10/2015    Procedure: BREAST LUMPECTOMY WITH RADIOACTIVE SEED AND SENTINEL LYMPH NODE BIOPSY;  Surgeon: Autumn Messing III, MD;  Location: [place];  Service: General;  Laterality: Right;  . Ep implantable device N/A 11/16/2015    Procedure: Loop Recorder Insertion;  Surgeon: [name], MD;  Location: [place] CV LAB;  Service: Cardiovascular;  Laterality: N/A;  . Tee without cardioversion N/A 11/16/2015    Procedure: TRANSESOPHAGEAL ECHOCARDIOGRAM (TEE);  Surgeon: Larey Dresser, MD;  Location: [place];  Service: Cardiovascular;  Laterality: N/A;  Assessment / Plan / Recommendation Clinical Impression   Carmen Duncan is a 75 y.o. right handed female with history of hypertension, hyperlipidemia, breast cancer status post resection and radiation, essential tremor, remote stroke in the past without residual deficits. Presented 11/14/2015 a left-sided weakness. MRI of the brain showed acute 1 cm right corona  radiata and right frontal periventricular white matter infarct. 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 Patient was admitted for comprehensive rehabilitation program. SLP evaluation completed on 11/22/2015 after pt reports during PT/OT therapy sessions that she feels "slower" than normal since CVA.  The results are as follows:  Pt presents with mild high level cognitive impairment in the areas of recall of new information and complex problem solving.  Pt was managing her medications and finances as well as driving prior to admission and she reports noticing general changes in her "memory" since CVA.  As a result, pt would benefit from skilled ST 1-3x/week while inpatient.  Do not anticipate ST needs post discharge from CIR.    Skilled Therapeutic Interventions          Cognitive-linguistic evaluation completed with results and recommendations reviewed with patient. Given that pt was managing medications and finances prior to admission, therapist recommended that pt at least have initial assistance with these tasks once discharged from CIR to prevent errors given new onset of higher level cognitive deficits.  Pt was in agreement with recommendations and reported that her son would be able to help her with meds and money at home. All questions were answered to pt's satisfaction regarding goals of therapy and prognostic expectations for recovery.     SLP Assessment  Patient will need skilled [facility] during CIR admission    Recommendations  Recommendations for Other Services: Neuropsych consult Patient destination: Home Follow up Recommendations: None Equipment Recommended: None recommended by SLP    SLP Frequency 1 to 3 out of 7 days   SLP Duration  SLP Intensity  SLP Treatment/Interventions anticipated d/c date of 7/5  Minumum of 1-2 x/day, 30 to 90 minutes  Cueing hierarchy;Functional tasks;Cognitive remediation/compensation;Environmental controls;Patient/family  education;Internal/external aids    Pain Pain Assessment Pain Assessment: No/denies pain Pain Score: 0-No pain  Prior Functioning Cognitive/Linguistic Baseline: Within functional limits Type of Home: House  Lives With: Family;Son Available Help at Discharge: Family;Available PRN/intermittently Vocation: Retired  Function:  Eating Eating               Cognition Comprehension Comprehension assist level: Follows complex conversation/direction with extra time/assistive device  Expression   Expression assist level: Expresses complex ideas: With extra time/assistive device  Social Interaction Social Interaction assist level: Interacts appropriately 90% of the time - Needs monitoring or encouragement for participation or interaction.  Problem Solving Problem solving assist level: Solves basic 90% of the time/requires cueing < 10% of the time  Memory Memory assist level: Recognizes or recalls 90% of the time/requires cueing < 10% of the time   Short Term Goals: Week 1: SLP Short Term Goal 1 (Week 1): STG=LTG due to ELOS   Refer to Care Plan for Long Term Goals  Recommendations for other services: None  Discharge Criteria: Patient will be discharged from SLP if patient refuses treatment 3 consecutive times without medical reason, if treatment goals not met, if there is a change in medical status, if patient makes no progress towards goals or if patient is discharged from hospital.  The above assessment, treatment plan, treatment alternatives and goals were discussed and mutually agreed  upon: by patient  [name] 11/22/2015, 4:18 PM

## 2015-11-22 NOTE — Progress Notes (Signed)
Subjective/Complaints: Bowel and bladder doing better  ROS-no SOB, pain over loop site,no N/V/D  Objective: Vital Signs: Blood pressure 128/60, pulse 85, temperature 98 F (36.7 C), temperature source Oral, resp. rate 16, height 5' 1"  (1.549 m), weight 55.6 kg (122 lb 9.2 oz), SpO2 98 %. No results found. Results for orders placed or performed during the hospital encounter of 11/17/15 (from the past 72 hour(s))  CBC WITH DIFFERENTIAL     Status: Abnormal   Collection Time: 11/20/15  6:16 AM  Result Value Ref Range   WBC 3.9 (L) 4.0 - 10.5 K/uL   RBC 4.53 3.87 - 5.11 MIL/uL   Hemoglobin 13.5 12.0 - 15.0 g/dL   HCT 41.0 36.0 - 46.0 %   MCV 90.5 78.0 - 100.0 fL   MCH 29.8 26.0 - 34.0 pg   MCHC 32.9 30.0 - 36.0 g/dL   RDW 13.9 11.5 - 15.5 %   Platelets 171 150 - 400 K/uL   Neutrophils Relative % 59 %   Neutro Abs 2.3 1.7 - 7.7 K/uL   Lymphocytes Relative 21 %   Lymphs Abs 0.8 0.7 - 4.0 K/uL   Monocytes Relative 11 %   Monocytes Absolute 0.4 0.1 - 1.0 K/uL   Eosinophils Relative 8 %   Eosinophils Absolute 0.3 0.0 - 0.7 K/uL   Basophils Relative 1 %   Basophils Absolute 0.0 0.0 - 0.1 K/uL  Comprehensive metabolic panel     Status: Abnormal   Collection Time: 11/20/15  6:16 AM  Result Value Ref Range   Sodium 140 135 - 145 mmol/L   Potassium 4.5 3.5 - 5.1 mmol/L   Chloride 107 101 - 111 mmol/L   CO2 28 22 - 32 mmol/L   Glucose, Bld 100 (H) 65 - 99 mg/dL   BUN 16 6 - 20 mg/dL   Creatinine, Ser 0.66 0.44 - 1.00 mg/dL   Calcium 9.1 8.9 - 10.3 mg/dL   Total Protein 7.0 6.5 - 8.1 g/dL   Albumin 3.1 (L) 3.5 - 5.0 g/dL   AST 23 15 - 41 U/L   ALT 20 14 - 54 U/L   Alkaline Phosphatase 69 38 - 126 U/L   Total Bilirubin 0.7 0.3 - 1.2 mg/dL   GFR calc non Af Amer >60 >60 mL/min   GFR calc Af Amer >60 >60 mL/min    Comment: (NOTE) The eGFR has been calculated using the CKD EPI equation. This calculation has not been validated in all clinical situations. eGFR's persistently <60  mL/min signify possible Chronic Kidney Disease.    Anion gap 5 5 - 15     HEENT: normal Cardio: RRR and loop site tender but no swelling Resp: CTA B/L GI: BS positive and non tender non distended Extremity:  Pulses positive and No Edema Skin:   Intact Neuro: Alert/Oriented and Abnormal Motor 4- LUE, 4/5 LLE, 5/5 on RIght side Musc/Skel:  Normal Gen NAD   Assessment/Plan: 1. 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 Physiatrist is providing close team supervision and 24 hour management of active medical problems listed below. Physiatrist and rehab team continue to assess barriers to discharge/monitor patient progress toward functional and medical goals. FIM: Function - Bathing Position: Shower Body parts bathed by patient: Right arm, Left arm, Chest, Abdomen, Front perineal area, Buttocks, Right upper leg, Left upper leg, Right lower leg, Left lower leg,  Back Body parts bathed by helper: Back Assist Level: Supervision or verbal cues  Function- Upper Body Dressing/Undressing What is the patient wearing?: Pull over shirt/dress, Bra Bra - Perfomed by patient: Thread/unthread right bra strap, Thread/unthread left bra strap, Hook/unhook bra (pull down sports bra) Pull over shirt/dress - Perfomed by patient: Thread/unthread right sleeve, Thread/unthread left sleeve, Put head through opening, Pull shirt over trunk Assist Level: Supervision or verbal cues Function - Lower Body Dressing/Undressing What is the patient wearing?: Pants, Socks, Shoes Position: Wheelchair/chair at sink Underwear - Performed by patient: Thread/unthread right underwear leg, Thread/unthread left underwear leg, Pull underwear up/down Pants- Performed by patient: Thread/unthread right pants leg, Pull pants up/down, Thread/unthread left pants  leg Pants- Performed by helper: Fasten/unfasten pants Non-skid slipper socks- Performed by patient: Don/doff right sock, Don/doff left sock Socks - Performed by patient: Don/doff right sock, Don/doff left sock Shoes - Performed by patient: Don/doff left shoe, Don/doff right shoe, Fasten right, Fasten left Shoes - Performed by helper: Fasten right, Fasten left Assist for footwear: Setup Assist for lower body dressing: Supervision or verbal cues  Function - Toileting Toileting steps completed by patient: Adjust clothing prior to toileting, Performs perineal hygiene, Adjust clothing after toileting Toileting Assistive Devices: Grab bar or rail Assist level: Supervision or verbal cues  Function - Air cabin crew transfer assistive device: Grab bar Assist level to toilet: Touching or steadying assistance (Pt > 75%) Assist level from toilet: Touching or steadying assistance (Pt > 75%)  Function - Chair/bed transfer Chair/bed transfer method: Ambulatory Chair/bed transfer assist level: Touching or steadying assistance (Pt > 75%) Chair/bed transfer assistive device: Armrests, Bedrails Chair/bed transfer details: Verbal cues for precautions/safety  Function - Locomotion: Wheelchair Will patient use wheelchair at discharge?: Yes Type: Manual Max wheelchair distance: 20 Assist Level: Moderate assistance (Pt 50 - 74%) Wheel 50 feet with 2 turns activity did not occur: Safety/medical concerns Wheel 150 feet activity did not occur: Safety/medical concerns Turns around,maneuvers to table,bed, and toilet,negotiates 3% grade,maneuvers on rugs and over doorsills: Yes Function - Locomotion: Ambulation Assistive device: No device Max distance: 200 Assist level: Touching or steadying assistance (Pt > 75%) Assist level: Touching or steadying assistance (Pt > 75%) Assist level: Touching or steadying assistance (Pt > 75%) Assist level: Touching or steadying assistance (Pt > 75%) Assist level:  Touching or steadying assistance (Pt > 75%)  Function - Comprehension Comprehension: Auditory Comprehension assist level: Follows complex conversation/direction with extra time/assistive device  Function - Expression Expression: Verbal Expression assist level: Expresses complex ideas: With extra time/assistive device  Function - Social Interaction Social Interaction assist level: Interacts appropriately 90% of the time - Needs monitoring or encouragement for participation or interaction.  Function - Problem Solving Problem solving assist level: Solves complex problems: With extra time  Function - Memory Memory assist level: More than reasonable amount of time Patient normally able to recall (first 3 days only): Current season, Location of own room, Staff names and faces, That he or she is in a hospital     Medical Problem List and Plan: 1. 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 DVT Prophylaxis/Anticoagulation: Subcutaneous Lovenox. Monitor platelet counts and for any signs of bleeding 3. Pain Management: Tylenol as needed. No pain at this time 4. Hypertension. Permissive hypertension for perfusion. Monitor BP with increased mobility, normalizing on Inderal for tremor  Filed Vitals:   11/22/15 0517 11/22/15 0758  BP: 139/69 128/60  Pulse: 81 85  Temp: 98 F (36.7 C)   Resp: 16    5. Neuropsych: This patient is capable of making decisions on her own behalf. 6. Skin/Wound Care: Routine skin checks 7. Fluids/Electrolytes/Nutrition: Routine I&O's with follow-up chemistries, lytes normal, low alb 8. Hyperlipidemia. Lipitor 9. Irritable bowel syndrome. Continue Librax, no BM for several days will need laxatives                                                                                                                                                                                                                                                                                                   10. Essential tremor. Patient on Inderal 20 mg daily prior to admission. Resume as needed , BP should tolerate no sig tremor noted today 11. Asthma. Continue [name] 100 mg twice a day 12.  Hypoalbuminemia- add prostat LOS (Days) 5 A FACE TO FACE EVALUATION WAS PERFORMED  Laurisa Sahakian E 11/22/2015, 8:35 AM

## 2015-11-22 NOTE — Progress Notes (Addendum)
Occupational Therapy Session Note  Patient Details  Name: Carmen Duncan MRN: [id number] Date of Birth: 03-18-1941  Today's Date: 11/22/2015 OT Individual Time: 1415-1530 ( 5 minutes of session is make up time) OT Individual Time Calculation (min): 75 min    Short Term Goals: Week 1:  OT Short Term Goal 1 (Week 1): STGs equal to LTGs based on ELOS  Skilled Therapeutic Interventions/Progress Updates:    Upon entering the room, pt seated in recliner chair with friend present in room. Pt agreeable to OT intervention. Pt with no c/o pain this session. However, food arriving to room at therapist arrival. Pt ambulated with RW and supervision but needing steady assist closer to destination secondary to fatigue. Pt seated and eating meal while therapist educating pt and friend on secondary stroke risks. Pt verbalized understanding. Pt engaged in bed making tasks from standing position with use of RW and steady assist for task. Pt taking seated rest break on soft, plush sofa with min cues for hand placement and technique for sit <>stand. Pt returning to room in same manner with call bell and all needed items within reach upon exiting the room.   Therapy Documentation Precautions:  Precautions Precautions: Fall Restrictions Weight Bearing Restrictions: No General:   Vital Signs: Therapy Vitals Temp: 97.9 F (36.6 C) Temp Source: Oral Pulse Rate: 70 Resp: 18 BP: (!) 145/47 mmHg Patient Position (if appropriate): Sitting Oxygen Therapy SpO2: 99 % O2 Device: Not Delivered Pain: Pain Assessment Pain Assessment: No/denies pain Pain Score: 0-No pain  See Function Navigator for Current Functional Status.   Therapy/Group: Individual Therapy  Phineas Semen 11/22/2015, 4:22 PM

## 2015-11-22 NOTE — Telephone Encounter (Signed)
LMTCB for Ashley.  

## 2015-11-22 NOTE — Progress Notes (Signed)
Physical Therapy Session Note  Patient Details  Name: Carmen Duncan MRN: [id number] Date of Birth: Oct 06, 1940  Today's Date: 11/22/2015 PT Individual Time: 0800-0900 PT Individual Time Calculation (min): 60 min   Short Term Goals: Week 1:  PT Short Term Goal 1 (Week 1): Pt will perfom bed mobility with SBA and LRAD PT Short Term Goal 2 (Week 1): Pt will perform transfers with SBA and LRAD PT Short Term Goal 3 (Week 1): Pt will perform 150' gait with SBA and LRAD PT Short Term Goal 4 (Week 1): Pt will perform 4 stairs with SBA and LRAD  Skilled Therapeutic Interventions/Progress Updates:    pt received supine in bed, denies pain and agreeable to treatment. Dressing performed EOB with setup for upper body dressing, S for lower body dressing due to sit <>stand and standing balance with close S. Gait within room to walk to sink, bathroom with min guard. Standing at sink, performed hygiene with S for standing balance. Bathroom transfer with S; clothing management and hygiene with mod I. Gait to gym with no AD and min guard x180'; pt reports her legs "feel really weak today', and requires one short standing rest break during trial. Sit <>stand 2x10 with no UE support. 2x10 lateral step ups LLE on 6" step with BUE support. Returned to room with gait x180' min guard due to urgency to use restroom. Bathroom transfer with assist level as above. Remained seated in recliner at completion of session, all needs within reach. Therapist d/c recommendation for quick release belt as pt cognitively intact, not impulsive and understands recommendation for staff present.   Therapy Documentation Precautions:  Precautions Precautions: Fall Restrictions Weight Bearing Restrictions: No Pain: Pain Assessment Pain Assessment: No/denies pain   See Function Navigator for Current Functional Status.   Therapy/Group: Individual Therapy  [name] 11/22/2015, 8:55 AM

## 2015-11-22 NOTE — Progress Notes (Signed)
Social Work Patient ID: Carmen Duncan, female   DOB: 1941/01/21, 75 y.o.   MRN: [id number] Met with pt and spoke with son via telephone to discuss team conference goals mod/i-supervision level and target discharge date 7/5. Pt is feeling better and realizes she leans to the left and hopes this will fix itself Before her discharge. Encouraged son to come in Tuesday to go through therapies with pt, to see her progress. He will look at his schedule. Will work on discharge needs.

## 2015-11-23 ENCOUNTER — Inpatient Hospital Stay (HOSPITAL_COMMUNITY): Payer: Medicare Other | Admitting: Physical Therapy

## 2015-11-23 ENCOUNTER — Inpatient Hospital Stay (HOSPITAL_COMMUNITY): Payer: Medicare Other | Admitting: Occupational Therapy

## 2015-11-23 ENCOUNTER — Inpatient Hospital Stay (HOSPITAL_COMMUNITY): Payer: Medicare Other | Admitting: Speech Pathology

## 2015-11-23 NOTE — Telephone Encounter (Signed)
LVM for Ashley to return call

## 2015-11-23 NOTE — Progress Notes (Signed)
Speech Language Pathology Daily Session Note  Patient Details  Name: Carmen Duncan MRN: [id number] Date of Birth: Jul 25, 1940  Today's Date: 11/23/2015 SLP Individual Time: 1300-1400 SLP Individual Time Calculation (min): 60 min  Short Term Goals: Week 1: SLP Short Term Goal 1 (Week 1): STG=LTG due to ELOS   Skilled Therapeutic Interventions: Pt seen for cognitive-linguistic goals. Pt able to count money with 90% acc mod I for increased time and write check with 100% acc mod I. Pt with noted difficulty recalling details of daily events and personal information. Discussed compensatory strategies with pt who reported "If I can't remember then I just ask my grandkids." Discussed this is an unreliable strategy as the pt has information that is only available to her such as details from a physician visit. Pt in agreement that writing information down in important. Encouraged habit of writing information down in a consistent location such as a day planner. Pt agreeable and she will ask family to bring one in.   Function:  Eating Eating   Modified Consistency Diet: No             Cognition Comprehension Comprehension assist level: Follows complex conversation/direction with extra time/assistive device  Expression   Expression assist level: Expresses complex ideas: With extra time/assistive device  Social Interaction Social Interaction assist level: Interacts appropriately 90% of the time - Needs monitoring or encouragement for participation or interaction.  Problem Solving Problem solving assist level: Solves basic 90% of the time/requires cueing < 10% of the time  Memory Memory assist level: Recognizes or recalls 90% of the time/requires cueing < 10% of the time    Pain Pain Assessment Pain Assessment: No/denies pain  Therapy/Group: Individual Therapy  Vinetta Bergamo MA, CCC-SLP 11/23/2015, 3:59 PM

## 2015-11-23 NOTE — Progress Notes (Signed)
Subjective/Complaints: Pt seen sitting up in her recliner.  She is watching TV and states she slept well overnight.    ROS- Denies CP, SOB, pain over loop site,no N/V/D  Objective: Vital Signs: Blood pressure 179/62, pulse 63, temperature 97.7 F (36.5 C), temperature source Oral, resp. rate 16, height 5\' 1"  (1.549 m), weight 55.6 kg (122 lb 9.2 oz), SpO2 96 %. No results found. No results found for this or any previous visit (from the past 72 hour(s)).   HEENT: Normocephalic, atraumatic Cardio: RRR  Resp: CTA B/L GI: BS positive and non tender non distended Skin:   Intact Neuro: Alert/Oriented   Motor 4+/5 LUE, 4/5 LLE, 5/5 on RIght side Musc/Skel:  No edema.  No tenderness.  Gen NAD. Vital signs reviewed.    Assessment/Plan: 1. 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 Physiatrist is providing close team supervision and 24 hour management of active medical problems listed below. Physiatrist and rehab team continue to assess barriers to discharge/monitor patient progress toward functional and medical goals. FIM: Function - Bathing Position: Shower Body parts bathed by patient: Right arm, Left arm, Chest, Abdomen, Front perineal area, Buttocks, Right upper leg, Left upper leg, Right lower leg, Left lower leg, Back Body parts bathed by helper: Back Assist Level: Supervision or verbal cues  Function- Upper Body Dressing/Undressing What is the patient wearing?: Pull over shirt/dress, Bra Bra - Perfomed by patient: Thread/unthread right bra strap, Thread/unthread left bra strap, Hook/unhook bra (pull down sports bra) Pull over shirt/dress - Perfomed by patient: Thread/unthread right sleeve, Thread/unthread left sleeve, Put head through opening, Pull shirt over trunk Assist Level: Set up Function - Lower Body  Dressing/Undressing What is the patient wearing?: Pants, Socks, Shoes Position: Sitting EOB Underwear - Performed by patient: Thread/unthread right underwear leg, Thread/unthread left underwear leg, Pull underwear up/down Pants- Performed by patient: Thread/unthread right pants leg, Pull pants up/down, Thread/unthread left pants leg Pants- Performed by helper: Fasten/unfasten pants Non-skid slipper socks- Performed by patient: Don/doff right sock, Don/doff left sock Socks - Performed by patient: Don/doff right sock, Don/doff left sock Shoes - Performed by patient: Don/doff left shoe, Don/doff right shoe, Fasten right, Fasten left Shoes - Performed by helper: Fasten right, Fasten left Assist for footwear: Supervision/touching assist Assist for lower body dressing: Supervision or verbal cues  Function - Toileting Toileting steps completed by patient: Adjust clothing prior to toileting, Performs perineal hygiene, Adjust clothing after toileting Toileting Assistive Devices: Grab bar or rail Assist level: More than reasonable time  Function - Air cabin crew transfer assistive device: Grab bar Assist level to toilet: Supervision or verbal cues Assist level from toilet: Supervision or verbal cues  Function - Chair/bed transfer Chair/bed transfer method: Ambulatory Chair/bed transfer assist level: Touching or steadying assistance (Pt > 75%) Chair/bed transfer assistive device: Armrests, Bedrails Chair/bed transfer details: Verbal cues for precautions/safety  Function - Locomotion: Wheelchair Will patient use wheelchair at discharge?: Yes Type: Manual Max wheelchair distance: 20 Assist Level: Moderate assistance (Pt 50 - 74%) Wheel 50 feet with 2 turns activity did not occur: Safety/medical concerns Wheel 150 feet activity did not occur: Safety/medical concerns Turns around,maneuvers to table,bed, and toilet,negotiates 3% grade,maneuvers on rugs and over doorsills: Yes Function -  Locomotion: Ambulation Assistive device: Walker-rolling Max distance: 150 Assist level: Supervision or verbal cues Assist level: Supervision or verbal cues Assist level: Supervision or  verbal cues Assist level: Touching or steadying assistance (Pt > 75%) Assist level: Touching or steadying assistance (Pt > 75%)  Function - Comprehension Comprehension: Auditory Comprehension assist level: Follows complex conversation/direction with extra time/assistive device  Function - Expression Expression: Verbal Expression assist level: Expresses complex ideas: With extra time/assistive device  Function - Social Interaction Social Interaction assist level: Interacts appropriately 90% of the time - Needs monitoring or encouragement for participation or interaction.  Function - Problem Solving Problem solving assist level: Solves basic 90% of the time/requires cueing < 10% of the time  Function - Memory Memory assist level: Recognizes or recalls 90% of the time/requires cueing < 10% of the time Patient normally able to recall (first 3 days only): Current season, Location of own room, Staff names and faces, That he or she is in a hospital     Medical Problem List and Plan: 1. 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 DVT Prophylaxis/Anticoagulation: Subcutaneous Lovenox. Monitor platelet counts and for any signs of bleeding 3. Pain Management: Tylenol as needed. No pain at this time 4. Hypertension. Monitor with increased mobility  One elevated reading overnight, will cont to monitor for trend 5. Neuropsych: This patient is capable of making decisions on her own behalf. 6. Skin/Wound Care: Routine skin checks 7. Fluids/Electrolytes/Nutrition: Routine I&O's  8. Hyperlipidemia. Lipitor 9. Irritable bowel syndrome. Continue Librax, no BM for several days will need laxatives    10.  Essential tremor. Patient on Inderal 20 mg daily prior to admission. Resumed. 11. Asthma. Continue [name] 100 mg twice a day 12.  Hypoalbuminemia- added prostat  LOS (Days) 6 A FACE TO FACE EVALUATION WAS PERFORMED  Ankit Lorie Phenix 11/23/2015, 8:55 AM

## 2015-11-23 NOTE — Telephone Encounter (Signed)
Will forward to RA

## 2015-11-23 NOTE — Progress Notes (Signed)
Occupational Therapy Session Note  Patient Details  Name: Carmen Duncan MRN: [id number] Date of Birth: 28-Aug-1940  Today's Date: 11/23/2015 OT Individual Time: 0945-1100 OT Individual Time Calculation (min): 75 min    Short Term Goals: Week 1:  OT Short Term Goal 1 (Week 1): STGs equal to LTGs based on ELOS  Skilled Therapeutic Interventions/Progress Updates:    Pt seen for skilled OT to facilitate L side awareness, postural awareness and balance with self care training and NMR. Pt complete B/D shower level with cues to shift wt to R elevating L shoulder to avoid L lean. 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 Pt ambulated to gym with RW needing frequent rest breaks due to leg weakness.  In gym, sat on elevated mat with feet off floor for core stability training of forward flexion and trunk elongation with lateral reaching. Pt needed tactile/ verbal cues to avoid trunk extension and leaning. Then lowered mat, with feet on floor to repeat exercises. Pt stated the exercises were much easier with feet supported.  Pt then transferred to arm chair to wait for her next PT session.   Therapy Documentation Precautions:  Precautions Precautions: Fall Restrictions Weight Bearing Restrictions: No    Vital Signs: Therapy Vitals Pulse Rate: 86 BP: (!) 142/68 mmHg Pain: Pain Assessment Pain Assessment: No/denies pain ADL:   See Function Navigator for Current Functional Status.   Therapy/Group: Individual Therapy  SAGUIER,JULIA 11/23/2015, 12:26 PM

## 2015-11-23 NOTE — Progress Notes (Signed)
Physical Therapy Session Note  Patient Details  Name: Carmen Duncan MRN: [id number] Date of Birth: 19-Apr-1941  Today's Date: 11/23/2015 PT Individual Time: 1100-1200 PT Individual Time Calculation (min): 60 min   Short Term Goals: Week 1:  PT Short Term Goal 1 (Week 1): Pt will perfom bed mobility with SBA and LRAD PT Short Term Goal 2 (Week 1): Pt will perform transfers with SBA and LRAD PT Short Term Goal 3 (Week 1): Pt will perform 150' gait with SBA and LRAD PT Short Term Goal 4 (Week 1): Pt will perform 4 stairs with SBA and LRAD  Skilled Therapeutic Interventions/Progress Updates:    Pt received seated in chair in gym with handoff from OT after previous session; denies pain and agreeable to treatment. Does c/o increased fatigue and LE weakness today, likely from increased intensity of therapy sessions yesterday. Nustep x8 min with BUE/BLE on level 4 with average 40 steps/min for aerobic endurance and strengthening. Supine and seated LE strengthening exercises all 2 sets 12 reps including straight leg raise, bridging, sidelying hip abduction, seated hip flexion, long arc quad, hip adduction isometric. Pt given handout with pictures and written directions for supine/seated LE exercises to perform independently outside of regular therapy sessions. Seated balance, midline orientation and postural retraining with mirror feedback, R lateral leans onto elbow for L trunk lengthening, lumbopelvic dissociation. Returned to room in w/c totalA for energy conservation. Transfer w/c >recliner with min guard; remained seated in recliner with all needs in reach at completion of session.   Therapy Documentation Precautions:  Precautions Precautions: Fall Restrictions Weight Bearing Restrictions: No Pain: Pain Assessment Pain Assessment: No/denies pain   See Function Navigator for Current Functional Status.   Therapy/Group: Individual Therapy  [name] 11/23/2015, 12:11 PM

## 2015-11-24 ENCOUNTER — Inpatient Hospital Stay (HOSPITAL_COMMUNITY): Payer: Medicare Other | Admitting: Occupational Therapy

## 2015-11-24 ENCOUNTER — Inpatient Hospital Stay (HOSPITAL_COMMUNITY): Payer: Medicare Other | Admitting: Speech Pathology

## 2015-11-24 ENCOUNTER — Inpatient Hospital Stay (HOSPITAL_COMMUNITY): Payer: Medicare Other | Admitting: Physical Therapy

## 2015-11-24 LAB — CREATININE, SERUM
CREATININE: 0.64 mg/dL (ref 0.44–1.00)
GFR calc Af Amer: >60 mL/min (ref >60)
GFR calc non Af Amer: >60 mL/min (ref >60)

## 2015-11-24 NOTE — Progress Notes (Signed)
Occupational Therapy Weekly Progress Note  Patient Details  Name: Carmen Duncan MRN: [id number] Date of Birth: 04/11/1941  Beginning of progress report period: November 18, 2015 End of progress report period: November 24, 2015  Today's Date: 11/24/2015 OT Individual Time: [id number] OT Individual Time Calculation (min): 75 min    STGs not set due to short LOS. Pt is continuing to work on [place].  Patient continues to demonstrate the following deficits: L hemiparesis, decreased postural awareness and control, decreased memory  and therefore will continue to benefit from skilled OT intervention to enhance overall performance with BADL.  Patient progressing toward long term goals..  Plan of care revisions: LTGs of bathing, homemaking, cooking, laundry modified to supervision versus mod I as pt continues to be at high risk for falls.  OT Short Term Goals Week 1:  OT Short Term Goal 1 (Week 1): STGs equal to LTGs based on ELOS OT Short Term Goal 1 - Progress (Week 1): Progressing toward goal Week 2:  OT Short Term Goal 1 (Week 2): Continue with LTGs.  Skilled Therapeutic Interventions/Progress Updates:    Pt seen for ADL retraining and NMR to focus on postural control and balance. Pt with reduced L lean today but continues to need cues with Rw as she tend to just put it to her side.  Pt completed self care with shower and dressing sitting in arm chair. Increase lean to L with crossing legs to tie shoes.  Cued pt to place her foot on foot stool. Pt taken to gym via w/c. Transferred to mat to work on sitting balance with dynamic reaching with feet supported and then unsupported. Improved reach with reduced posterior lean. Worked on actively crossing L leg over R knee and maintaining balance to increase her safety with tying shoes.  Pt held posture well. Pt taken back to room to rest prior to next therapy.  Therapy Documentation Precautions:  Precautions Precautions: Fall Restrictions Weight Bearing  Restrictions: No    Vital Signs: Therapy Vitals Temp: 98.2 F (36.8 C) Temp Source: Oral Pulse Rate: 79 Resp: 18 BP: (!) 149/56 mmHg Patient Position (if appropriate): Lying Oxygen Therapy SpO2: 98 % O2 Device: Not Delivered Pain: Pain Assessment Pain Assessment: No/denies pain ADL:    See Function Navigator for Current Functional Status.   Therapy/Group: Individual Therapy  Clyde Park 11/24/2015, 8:27 AM

## 2015-11-24 NOTE — Progress Notes (Signed)
Physical Therapy Weekly Progress Note  Patient Details  Name: Carmen Duncan MRN: [id number] Date of Birth: Jun 05, 1940  Beginning of progress report period: November 18, 2015 End of progress report period: November 24, 2015  Today's Date: 11/24/2015 PT Individual Time: 1000-1100 PT Individual Time Calculation (min): 60 min   Patient has met 4 of 4 short term goals.  Pt currently at min guard/S level overall due to balance impairments, LLE strength deficits, postural impairments with L lateral lean in sitting and standing. Pt limited by fatigue as well as memory impairments resulting in poor carryover of safety cues from session to session. No family has been present for sessions, however discussed with CSW that therapist recommend family attend session prior to d/c for education on current functional level and to discuss safety recommendations.   Patient continues to demonstrate the following deficits: activity tolerance, balance, postural control, ability to compensate for deficits, functional use of  left upper extremity and left lower extremity, attention, awareness and coordination and therefore will continue to benefit from skilled PT intervention to enhance overall performance with bed mobility, transfers, gait, stairs, home and community access.   Patient progressing toward long term goals..  Continue plan of care.  PT Short Term Goals Week 1:  PT Short Term Goal 1 (Week 1): Pt will perfom bed mobility with SBA and LRAD PT Short Term Goal 1 - Progress (Week 1): Met PT Short Term Goal 2 (Week 1): Pt will perform transfers with SBA and LRAD PT Short Term Goal 2 - Progress (Week 1): Met PT Short Term Goal 3 (Week 1): Pt will perform 150' gait with SBA and LRAD PT Short Term Goal 3 - Progress (Week 1): Met PT Short Term Goal 4 (Week 1): Pt will perform 4 stairs with SBA and LRAD PT Short Term Goal 4 - Progress (Week 1): Met Week 2:  PT Short Term Goal 1 (Week 2): =LTG due to estimated  LOS  Skilled Therapeutic Interventions/Progress Updates:    Pt received seated in w/c, denies pain but does report excessive fatigue following OT session this AM. Disucssed energy conservation with pt, planning shower around daily activities, possibly at night or with planned rest break Transported to gym via w/c totalA for energy conservation. Stand pivot transfer w/c <>nustep with S. Nustep x9 min with BUE/BLE for strengthening and aerobic endurance. Educated pt on Borg RPE scale, and pt rates activity at 15/20. Dynavision on mode A and B on level surface and compliant foam surface for balance and cognitive challenge. Slower reaction speed on foam compared to on level surface (2.5 sec vs 1.8 sec). Mode B with words on T scope, pt has slower reaction time (2.9 sec) and missed larger percentage of lights/words. Educated pt on carryover into ADLs, ambulating on unlevel surfaces while having a conversation, driving, etc. Returned to room and ambulated into/out of bathroom with S; performed clothing management and hygiene with mod I. Remained seated in recliner at completion of session, all needs in reach.   Therapy Documentation Precautions:  Precautions Precautions: Fall Restrictions Weight Bearing Restrictions: No Pain: Pain Assessment Pain Assessment: No/denies pain   See Function Navigator for Current Functional Status.  Therapy/Group: Individual Therapy  [name] 11/24/2015, 11:00 AM

## 2015-11-24 NOTE — Progress Notes (Signed)
Speech Language Pathology Daily Session Note  Patient Details  Name: Carmen Duncan MRN: [id number] Date of Birth: 09-03-1940  Today's Date: 11/24/2015 SLP Individual Time: 1350-1432 SLP Individual Time Calculation (min): 42 min  Short Term Goals: Week 1: SLP Short Term Goal 1 (Week 1): STG=LTG due to ELOS   Skilled Therapeutic Interventions: Pt was seen for skilled ST targeting cognitive goals.  Based on recommendations from AM ST treatment session, SLP continued medication management tasks to continue to address goals for functional problem solving.  Therapist administered medication subtest of ALFA standardized cognitive assessment, during which pt required consistent min assist level verbal cues for organization and working memory to place pretend pills into a medication chart.  Pt remains adamant that managed her own medications prior to admission.  Son arrived halfway through today's treatment session and verified pt report of previous level of function.  Pt reports "I used to be able to just rattle these off and now I can't," referring to her list of medications.  Therapist provided skilled education to pt and pt's son recommending use of pill box and that pt have assistance for medication and financial management at discharge.  Both pt and son verbalized understanding of recommendations and are in agreement with plan of care.  Will downgrade goals to supervision to reflect current recommendations.  Given slow progress made thus far in ST treatment sessions, pt may also benefit from ongoing ST services at next level of care to maximize functional independence and reduce burden of care.  CSW made aware.  Continue per current plan of care.    Function:  Eating Eating                 Cognition Comprehension Comprehension assist level: Follows complex conversation/direction with extra time/assistive device  Expression   Expression assist level: Expresses complex 90% of the time/cues <  10% of the time  Social Interaction Social Interaction assist level: Interacts appropriately 90% of the time - Needs monitoring or encouragement for participation or interaction.  Problem Solving Problem solving assist level: Solves basic 75 - 89% of the time/requires cueing 10 - 24% of the time  Memory Memory assist level: Recognizes or recalls 75 - 89% of the time/requires cueing 10 - 24% of the time    Pain Pain Assessment Pain Assessment: No/denies pain  Therapy/Group: Individual Therapy  Eulalia Ellerman, Selinda Orion 11/24/2015, 4:06 PM

## 2015-11-24 NOTE — Progress Notes (Signed)
Subjective/Complaints: Pt sitting up in bed. She had a good night.    ROS- Denies CP, SOB, pain over loop site,no N/V/D  Objective: Vital Signs: Blood pressure 149/56, pulse 79, temperature 98.2 F (36.8 C), temperature source Oral, resp. rate 18, height '5\' 1"'$  (1.549 m), weight 55.6 kg (122 lb 9.2 oz), SpO2 98 %. No results found. Results for orders placed or performed during the hospital encounter of 11/17/15 (from the past 72 hour(s))  Creatinine, serum     Status: None   Collection Time: 11/24/15  5:42 AM  Result Value Ref Range   Creatinine, Ser 0.64 0.44 - 1.00 mg/dL   GFR calc non Af Amer >60 >60 mL/min   GFR calc Af Amer >60 >60 mL/min    Comment: (NOTE) The eGFR has been calculated using the CKD EPI equation. This calculation has not been validated in all clinical situations. eGFR's persistently <60 mL/min signify possible Chronic Kidney Disease.      HEENT: Normocephalic, atraumatic Cardio: RRR  Resp: CTA B/L GI: BS positive and non tender non distended Skin:   Intact. Warm and dry Neuro: Alert/Oriented  Motor 4+/5 LUE, 4+/5 LLE, 5/5 on Right side Musc/Skel:  No edema.  No tenderness.  Gen NAD. Vital signs reviewed.    Assessment/Plan: 1. 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 Physiatrist is providing close team supervision and 24 hour management of active medical problems listed below. Physiatrist and rehab team continue to assess barriers to discharge/monitor patient progress toward functional and medical goals. FIM: Function - Bathing Position: Shower Body parts bathed by patient: Right arm, Left arm, Chest, Abdomen, Front perineal area, Buttocks, Right upper leg, Left upper leg, Right lower leg, Left lower leg, Back Body parts bathed by helper: Back Assist Level: Supervision or verbal  cues  Function- Upper Body Dressing/Undressing What is the patient wearing?: Pull over shirt/dress, Bra Bra - Perfomed by patient: Thread/unthread right bra strap, Thread/unthread left bra strap, Hook/unhook bra (pull down sports bra) Pull over shirt/dress - Perfomed by patient: Thread/unthread right sleeve, Thread/unthread left sleeve, Put head through opening, Pull shirt over trunk Assist Level: Set up Function - Lower Body Dressing/Undressing What is the patient wearing?: Pants, Socks, Shoes Position: Sitting EOB Underwear - Performed by patient: Thread/unthread right underwear leg, Thread/unthread left underwear leg, Pull underwear up/down Pants- Performed by patient: Thread/unthread right pants leg, Pull pants up/down, Thread/unthread left pants leg Pants- Performed by helper: Fasten/unfasten pants Non-skid slipper socks- Performed by patient: Don/doff right sock, Don/doff left sock Socks - Performed by patient: Don/doff right sock, Don/doff left sock Shoes - Performed by patient: Don/doff left shoe, Don/doff right shoe, Fasten right, Fasten left Shoes - Performed by helper: Fasten right, Fasten left Assist for footwear: Supervision/touching assist Assist for lower body dressing: Supervision or verbal cues  Function - Toileting Toileting steps completed by patient: Adjust clothing prior to toileting, Performs perineal hygiene, Adjust clothing after toileting Toileting Assistive Devices: Grab bar or rail Assist level: More than reasonable time  Function - Air cabin crew transfer assistive device: Grab bar Assist level to toilet: Supervision or verbal cues Assist level from toilet: Supervision or verbal cues  Function - Chair/bed transfer Chair/bed transfer method: Ambulatory Chair/bed transfer assist level: Supervision or verbal cues Chair/bed transfer assistive device: Walker, Armrests Chair/bed transfer details: Verbal cues for precautions/safety  Function -  Locomotion: Wheelchair Will  patient use wheelchair at discharge?: Yes Type: Manual Max wheelchair distance: 20 Assist Level: Moderate assistance (Pt 50 - 74%) Wheel 50 feet with 2 turns activity did not occur: Safety/medical concerns Wheel 150 feet activity did not occur: Safety/medical concerns Turns around,maneuvers to table,bed, and toilet,negotiates 3% grade,maneuvers on rugs and over doorsills: Yes Function - Locomotion: Ambulation Assistive device: Walker-rolling Max distance: 150 Assist level: Supervision or verbal cues Assist level: Supervision or verbal cues Assist level: Supervision or verbal cues Assist level: Touching or steadying assistance (Pt > 75%) Assist level: Touching or steadying assistance (Pt > 75%)  Function - Comprehension Comprehension: Auditory Comprehension assist level: Follows complex conversation/direction with extra time/assistive device  Function - Expression Expression: Verbal Expression assist level: Expresses complex ideas: With extra time/assistive device  Function - Social Interaction Social Interaction assist level: Interacts appropriately 90% of the time - Needs monitoring or encouragement for participation or interaction.  Function - Problem Solving Problem solving assist level: Solves basic 90% of the time/requires cueing < 10% of the time  Function - Memory Memory assist level: Recognizes or recalls 90% of the time/requires cueing < 10% of the time Patient normally able to recall (first 3 days only): Current season, Location of own room, Staff names and faces, That he or she is in a hospital     Medical Problem List and Plan: 1. 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 DVT Prophylaxis/Anticoagulation: Subcutaneous Lovenox. Monitor platelet counts and for any signs of bleeding 3. Pain Management: Tylenol as needed. No pain at  this time 4. Hypertension. Monitor with increased mobility  Overall, under reasonable control 5. Neuropsych: This patient is capable of making decisions on her own behalf. 6. Skin/Wound Care: Routine skin checks 7. Fluids/Electrolytes/Nutrition: Routine I&O's  8. Hyperlipidemia. Lipitor 9. Irritable bowel syndrome. Continue Librax, no BM for several days will need laxatives    10. Essential tremor. Patient on Inderal 20 mg daily prior to admission. Resumed. 11. Asthma. Continue [name] 100 mg twice a day 12.  Hypoalbuminemia- added prostat  LOS (Days) 7 A FACE TO FACE EVALUATION WAS PERFORMED  Ankit Lorie Phenix 11/24/2015, 8:59 AM

## 2015-11-24 NOTE — Progress Notes (Signed)
Social Work Patient ID: Carmen Duncan, female   DOB: 1940/06/23, 75 y.o.   MRN: [id number] Spoke with [employer] who reports per his medical director they will cover until 7/2 with discharge 7/3. Have asked MD if any medical issues to keep her her and he reports no. Have informed team and pt, message left for son. Plan for discharge after therapies Monday. Pt has equipment from previous admissions and will work on home health follow up. Await son's return call.

## 2015-11-24 NOTE — Progress Notes (Signed)
Speech Language Pathology Daily Session Note  Patient Details  Name: Carmen Duncan MRN: [id number] Date of Birth: 12-Jan-1941  Today's Date: 11/24/2015 SLP Individual Time: 1130-1200 SLP Individual Time Calculation (min): 30 min  Short Term Goals: Week 1: SLP Short Term Goal 1 (Week 1): STG=LTG due to ELOS   Skilled Therapeutic Interventions: Pt seen for cognitive-linguistic goals.  Pt with noted difficulty recalling home meds- pt able to recall 1/7 by name and an additional med by function at mod I. Unable to recall what additional meds she takes and what they are for. Upon review of home meds, pt indicated familiarity with meds and was able to recall what a couple of the meds were for. Pt indicated that she manages her own meds without a med box and takes them all at night. She reports that one of the meds "...says 2 times/day, but I only take it one time. Don't ask me which one. It seems to be okay." Discussed use of a med box with family member intially managing meds to avoid any errors- pt verbalized agreement, but suspect she will require reinforcement.   Function:  Eating Eating                 Cognition Comprehension Comprehension assist level: Follows complex conversation/direction with extra time/assistive device  Expression   Expression assist level: Expresses complex 90% of the time/cues < 10% of the time  Social Interaction Social Interaction assist level: Interacts appropriately 90% of the time - Needs monitoring or encouragement for participation or interaction.  Problem Solving Problem solving assist level: Solves basic 90% of the time/requires cueing < 10% of the time  Memory Memory assist level: Recognizes or recalls 75 - 89% of the time/requires cueing 10 - 24% of the time    Pain Pain Assessment Pain Assessment: No/denies pain  Therapy/Group: Individual Therapy  Vinetta Bergamo MA, CCC-SLP 11/24/2015, 12:22 PM

## 2015-11-25 ENCOUNTER — Inpatient Hospital Stay (HOSPITAL_COMMUNITY): Payer: Medicare Other | Admitting: Physical Therapy

## 2015-11-25 ENCOUNTER — Inpatient Hospital Stay (HOSPITAL_COMMUNITY): Payer: Medicare Other | Admitting: Occupational Therapy

## 2015-11-25 NOTE — Progress Notes (Signed)
Occupational Therapy Session Note  Patient Details  Name: Carmen Duncan MRN: [id number] Date of Birth: 12-18-1940  Today's Date: 11/25/2015 OT Individual Time:  - 740-840 Total time:60 minutes     Skilled Therapeutic Interventions/Progress Updates: Patient at overall supervision level for functional mobility in room shower and ambulated without assistive device with supervision by this clinician.   Patient reported that she is hard of hearing in her left her and often asked for information or questions to be repeated.  During this session, patient did require occasional prompts to initiated the verbal tasks or continue with the next sequenced task - may have been due to hard of hearing, or perhaps it was a result of unfamiliarity with this clinician and tone of voice of mode of operation, rather than any particular cognitive deficits this session.     Therapy Documentation Precautions:  Precautions Precautions: Fall Restrictions Weight Bearing Restrictions: No    Pain:denied    See Function Navigator for Current Functional Status.   Therapy/Group: Individual Therapy  Alfredia Ferguson [facility] 11/25/2015, 4:29 PM

## 2015-11-25 NOTE — Progress Notes (Signed)
Physical Therapy Session Note  Patient Details  Name: Carmen Duncan MRN: [id number] Date of Birth: [birth year]  Today's Date: 11/25/2015 PT Individual Time: 1445-1520 AND 1300-1316 PT Individual Time Calculation (min): 35 min AND 60min   Short Term Goals: Week 2:  PT Short Term Goal 1 (Week 2): =LTG due to estimated LOS  Skilled Therapeutic Interventions/Progress Updates:    Patient received sitting in recliner finishing lunch and agreeable to PT. Patient performed stand pivot transfer to [facility] with supervision A from PT. Patient reports significant increase in nausea. Patient requested bucket and has emesis episode sitting in WC. Following emesis patient returned to [place] with stand pivot transfer. Therapy postponed at this time due to continued nausea. Plan to re-attempt later in day. RN notified of emesis.   PT returned and found patient sitting in recliner. Patient reports that she is still a little nauseous but agreeable to PT. Gait training for 117ft with RW and supervision A in hall. PT instructed patient in stairs x12 with 1 rail on R ascending with self selected step over step technique, with intermittent step pattern.  Gait training through obstacle course with RW including weave through 6 cones and step up and over 6 inch curb x 2. PT provided min cues for improved AD management with turn to L to remain in RW.    Patient returned to room and left in Recliner with call bell within reach.   Therapy Documentation Precautions:  Precautions Precautions: Fall Restrictions Weight Bearing Restrictions: No General: PT Amount of Missed Time (min): 9 Minutes Vital Signs: Therapy Vitals Temp: 97.5 F (36.4 C) Temp Source: Oral Pulse Rate: 75 Resp: 18 BP: (!) 147/64 mmHg Patient Position (if appropriate): Sitting Oxygen Therapy SpO2: 97 % O2 Device: Not Delivered     See Function Navigator for Current Functional Status.   Therapy/Group: Individual Therapy  Lorie Phenix 11/25/2015, 3:24 PM

## 2015-11-25 NOTE — Progress Notes (Signed)
Subjective/Complaints: Getting ready to eat breakfast. No new complaints. Appetite not great.    ROS- Denies CP, SOB, pain over loop site,no N/V/D  Objective: Vital Signs: Blood pressure 149/77, pulse 74, temperature 97.9 F (36.6 C), temperature source Oral, resp. rate 17, height 5' 1"  (1.549 m), weight 55.6 kg (122 lb 9.2 oz), SpO2 98 %. No results found. Results for orders placed or performed during the hospital encounter of 11/17/15 (from the past 72 hour(s))  Creatinine, serum     Status: None   Collection Time: 11/24/15  5:42 AM  Result Value Ref Range   Creatinine, Ser 0.64 0.44 - 1.00 mg/dL   GFR calc non Af Amer >60 >60 mL/min   GFR calc Af Amer >60 >60 mL/min    Comment: (NOTE) The eGFR has been calculated using the CKD EPI equation. This calculation has not been validated in all clinical situations. eGFR's persistently <60 mL/min signify possible Chronic Kidney Disease.      HEENT: Normocephalic, atraumatic Cardio: RRR  Resp: CTA B/L GI: BS positive and non tender non distended Skin:   Intact. Warm and dry Neuro: Alert/Oriented  Motor 4+/5 LUE, 4+/5 LLE, 5/5 on Right side unchanged today Musc/Skel:  No edema.  No tenderness.  Gen NAD. Vital signs reviewed.    Assessment/Plan: 1. 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 Physiatrist is providing close team supervision and 24 hour management of active medical problems listed below. Physiatrist and rehab team continue to assess barriers to discharge/monitor patient progress toward functional and medical goals. FIM: Function - Bathing Position: Shower Body parts bathed by patient: Right arm, Left arm, Chest, Abdomen, Front perineal area, Buttocks, Right upper leg, Left upper leg, Right lower leg, Left lower leg, Back Body parts bathed by  helper: Back Assist Level: Supervision or verbal cues  Function- Upper Body Dressing/Undressing What is the patient wearing?: Pull over shirt/dress, Bra Bra - Perfomed by patient: Thread/unthread right bra strap, Thread/unthread left bra strap, Hook/unhook bra (pull down sports bra) Pull over shirt/dress - Perfomed by patient: Thread/unthread right sleeve, Thread/unthread left sleeve, Put head through opening, Pull shirt over trunk Assist Level: Set up Function - Lower Body Dressing/Undressing What is the patient wearing?: Pants, Socks, Shoes, Underwear Position: Wheelchair/chair at [employer] - Performed by patient: Thread/unthread right underwear leg, Thread/unthread left underwear leg, Pull underwear up/down Pants- Performed by patient: Thread/unthread right pants leg, Pull pants up/down, Thread/unthread left pants leg Pants- Performed by helper: Fasten/unfasten pants Non-skid slipper socks- Performed by patient: Don/doff right sock, Don/doff left sock Socks - Performed by patient: Don/doff right sock, Don/doff left sock Shoes - Performed by patient: Don/doff left shoe, Don/doff right shoe, Fasten right, Fasten left Shoes - Performed by helper: Fasten right, Fasten left Assist for footwear: Supervision/touching assist Assist for lower body dressing: Supervision or verbal cues  Function - Toileting Toileting steps completed by patient: Adjust clothing prior to toileting, Performs perineal hygiene, Adjust clothing after toileting Toileting Assistive Devices: Grab bar or rail Assist level: Supervision or verbal cues, More than reasonable time  Function - Air cabin crew transfer assistive device: Walker Assist level to toilet: Supervision or verbal cues Assist level from toilet: Supervision or verbal cues  Function - Chair/bed transfer Chair/bed transfer method: Ambulatory Chair/bed transfer assist level: Supervision or verbal cues Chair/bed transfer assistive device:  Walker Chair/bed transfer details: Verbal cues for  precautions/safety  Function - Locomotion: Wheelchair Will patient use wheelchair at discharge?: Yes Type: Manual Max wheelchair distance: 20 Assist Level: Moderate assistance (Pt 50 - 74%) Wheel 50 feet with 2 turns activity did not occur: Safety/medical concerns Wheel 150 feet activity did not occur: Safety/medical concerns Turns around,maneuvers to table,bed, and toilet,negotiates 3% grade,maneuvers on rugs and over doorsills: Yes Function - Locomotion: Ambulation Assistive device: No device Max distance: 30 Assist level: Supervision or verbal cues Assist level: Supervision or verbal cues Assist level: Supervision or verbal cues Assist level: Touching or steadying assistance (Pt > 75%) Assist level: Touching or steadying assistance (Pt > 75%)  Function - Comprehension Comprehension: Auditory Comprehension assist level: Follows complex conversation/direction with extra time/assistive device  Function - Expression Expression: Verbal Expression assist level: Expresses complex ideas: With extra time/assistive device  Function - Social Interaction Social Interaction assist level: Interacts appropriately 90% of the time - Needs monitoring or encouragement for participation or interaction.  Function - Problem Solving Problem solving assist level: Solves basic 90% of the time/requires cueing < 10% of the time  Function - Memory Memory assist level: Recognizes or recalls 90% of the time/requires cueing < 10% of the time Patient normally able to recall (first 3 days only): Current season, Location of own room, Staff names and faces, That he or she is in a hospital     Medical Problem List and Plan: 1. 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 DVT Prophylaxis/Anticoagulation: Subcutaneous Lovenox. Monitor platelet  counts and for any signs of bleeding 3. Pain Management: Tylenol as needed. No pain at this time 4. Hypertension. Monitor with increased mobility  Overall, under reasonable control 5. Neuropsych: This patient is capable of making decisions on her own behalf. 6. Skin/Wound Care: Routine skin checks 7. Fluids/Electrolytes/Nutrition: Routine I&O's. Encourage PO intake 8. Hyperlipidemia. Lipitor 9. Irritable bowel syndrome. Continue Librax, no BM for several days will need laxatives    10. Essential tremor. Patient on Inderal 20 mg daily prior to admission. Resumed. 11. Asthma. Continue [name] 100 mg twice a day 12.  Hypoalbuminemia- added prostat  LOS (Days) 8 A FACE TO FACE EVALUATION WAS PERFORMED  Carmen Duncan T 11/25/2015, 9:08 AM

## 2015-11-26 NOTE — Discharge Summary (Signed)
Carmen Duncan NO.:  [id number]  MEDICAL RECORD NO.:  [id number]  LOCATION:  4M11C                        FACILITY:  [facility]  PHYSICIAN:  Carmen Duncan, Carmen DuncanDATE OF BIRTH:  1941/04/12  DATE OF ADMISSION:  11/17/2015 DATE OF DISCHARGE:  11/27/2015                              DISCHARGE SUMMARY   DISCHARGE DIAGNOSES: 1. Right corona radiata infarct status post loop recorder. 2. Subcutaneous Lovenox for DVT prophylaxis. 3. Hypertension. 4. Hyperlipidemia. 5. Irritable bowel syndrome. 6. Essential tremor. 7. Asthma.  HISTORY OF PRESENT ILLNESS:  This is a 75 year old right-handed female with history of hypertension, hyperlipidemia, breast cancer with resection and radiation as well as essential tremor, remote stroke in the past without residual weakness, maintained on Plavix.  Lives with son and family.  Independent prior to admission.  Presented on November 14, 2015, with left-sided weakness.  MRI of the brain showed acute 1 cm right corona radiata and right frontal periventricular white matter infarct.  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  MRA of the head with no emergent large vessel occlusion.  TEE with ejection fraction of 60%, no thrombus and a loop recorder was placed on November 16, 2015.  The patient did not receive tPA.  Neurology consulted, continued to be maintained on Plavix therapy.  Subcutaneous Lovenox for DVT prophylaxis.  Physical and occupational therapy ongoing.  The patient was admitted for a comprehensive rehab program.  PAST MEDICAL HISTORY:  See discharge diagnoses.  SOCIAL HISTORY:  Lives with family.  Independent prior to admission.  FUNCTIONAL STATUS UPON ADMISSION TO REHAB SERVICES:  Minimal assist to ambulate 120 feet with a rolling walker, minimal assist sit to stand; min to mod assist activities of daily living.  PHYSICAL EXAMINATION:  VITAL SIGNS:  Blood pressure 135/89, pulse 75, temperature 98, respirations 20. GENERAL:   This was an alert female, oriented to person, place, and time. LUNGS:  Clear to auscultation without wheeze. CARDIAC:  Regular rate and rhythm without murmur. ABDOMEN:  Soft, nontender.  Good bowel sounds.  Good awareness of deficits.  REHABILITATION HOSPITAL COURSE:  The patient was admitted to Inpatient Rehab Services with therapies initiated on a 3-hour daily basis consisting of physical therapy, occupational therapy, speech therapy, and rehabilitation nursing.  The following issues were addressed during the patient's rehabilitation stay.  Pertaining to Carmen Duncan's right corona radiata infarct remained stable, maintained on Plavix therapy. She had undergone placement of loop recorder.  She would follow up with Neurology Services as well as Cardiology Services.  No chest pain or shortness of breath.  Subcutaneous Lovenox for DVT prophylaxis, no bleeding episodes.  Blood pressure is maintained and monitored without antihypertensive medication.  She did continue on low-dose Inderal for history of essential tremor.  Irritable bowel syndrome.  She continued on Librax.  The patient received weekly collaborative interdisciplinary team conferences to discuss estimated length of stay, family teaching, any barriers to discharge.  The patient performs stand pivot transfers to wheelchair with supervision assist.  Ambulating 180 feet with rolling walker supervision.  Gait through obstacle course with rolling walker, including weaving through 6 cones and step up and over 6 inch curb x2 with supervision  to minimal assist.  She could gather her belongings for activities of daily living and homemaking.  She did require occasional prompts to initiate.  Speech Therapy continued to work with the patient on medication management tasks, to continue to address goals for all problem solving.  Family would assist with medications.  Full family teaching was completed and plan discharge to home.  DISCHARGE  MEDICATIONS: 1. Lipitor 20 mg p.o. daily. 2. Librax 1 capsule p.o. b.i.d. as needed. 3. Plavix 75 mg p.o. daily. 4. [name] 100 mg p.o. b.i.d. 5. Protonix 40 mg p.o. daily. 6. Inderal 20 mg p.o. daily. 7. Senokot S one tablet p.o. b.i.d., hold for loose stools.  DIET:  Regular.  FOLLOWUP:  She would follow up with Dr. Alysia Duncan at the outpatient rehab center to be contacted; Dr. Carolann Duncan, medical management; Dr. Erlinda Duncan, Neurology Service 1 month call for appointment; Dr. Thompson Duncan call for appointment.     Carmen Duncan, P.A.   ______________________________ Carmen Duncan, M.D.    DA/MEDQ  D:  11/26/2015  T:  11/26/2015  Job:  [id number]  cc:   Carmen Duncan, M.D. Carmen Grayer, MD Carmen Duncan

## 2015-11-26 NOTE — Progress Notes (Signed)
Subjective/Complaints: Mild episode of nausea yesterday. Resolved on its own. Feels fine today. Only happened once before  ROS- Denies CP, SOB, pain over loop site,no N/V/D  Objective: Vital Signs: Blood pressure 145/61, pulse 69, temperature 97.7 F (36.5 C), temperature source Oral, resp. rate 16, height 5' 1"  (1.549 m), weight 55.6 kg (122 lb 9.2 oz), SpO2 99 %. No results found. Results for orders placed or performed during the hospital encounter of 11/17/15 (from the past 72 hour(s))  Creatinine, serum     Status: None   Collection Time: 11/24/15  5:42 AM  Result Value Ref Range   Creatinine, Ser 0.64 0.44 - 1.00 mg/dL   GFR calc non Af Amer >60 >60 mL/min   GFR calc Af Amer >60 >60 mL/min    Comment: (NOTE) The eGFR has been calculated using the CKD EPI equation. This calculation has not been validated in all clinical situations. eGFR's persistently <60 mL/min signify possible Chronic Kidney Disease.      HEENT: Normocephalic, atraumatic Cardio: RRR  Resp: CTA B/L GI: BS positive, soft, non-tender Skin:   Intact. Warm and dry Neuro: Alert/Oriented  Motor 4+/5 LUE, 4+/5 LLE, 5/5 on Right side unchanged today Musc/Skel:  No edema.  No tenderness.  Gen NAD. Vital signs reviewed.    Assessment/Plan: 1. 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 Physiatrist is providing close team supervision and 24 hour management of active medical problems listed below. Physiatrist and rehab team continue to assess barriers to discharge/monitor patient progress toward functional and medical goals. FIM: Function - Bathing Position: Shower Body parts bathed by patient: Right arm, Left arm, Chest, Abdomen, Front perineal area, Buttocks, Right upper leg, Left upper leg, Right lower leg, Left lower leg Body parts bathed  by helper: Back Assist Level: Supervision or verbal cues  Function- Upper Body Dressing/Undressing What is the patient wearing?: Pull over shirt/dress, Bra Bra - Perfomed by patient: Thread/unthread left bra strap, Thread/unthread right bra strap, Hook/unhook bra (pull down sports bra) Pull over shirt/dress - Perfomed by patient: Thread/unthread right sleeve, Thread/unthread left sleeve, Put head through opening, Pull shirt over trunk Assist Level: Supervision or verbal cues Function - Lower Body Dressing/Undressing What is the patient wearing?: Shoes, Pants, Underwear Position: Wheelchair/chair at sink Underwear - Performed by patient: Thread/unthread right underwear leg, Thread/unthread left underwear leg, Pull underwear up/down Pants- Performed by patient: Thread/unthread right pants leg, Thread/unthread left pants leg, Pull pants up/down, Fasten/unfasten pants Pants- Performed by helper: Fasten/unfasten pants Non-skid slipper socks- Performed by patient:  (did not wear socks today) Socks - Performed by patient: Don/doff right sock, Don/doff left sock Shoes - Performed by patient: Don/doff right shoe, Don/doff left shoe, Fasten right, Fasten left Shoes - Performed by helper: Fasten right, Fasten left Assist for footwear: Supervision/touching assist Assist for lower body dressing: Supervision or verbal cues  Function - Toileting Toileting steps completed by patient: Adjust clothing prior to toileting, Performs perineal hygiene, Adjust clothing after toileting Toileting Assistive Devices: Grab bar or rail Assist level: Supervision or verbal cues, More than reasonable time  Function - Air cabin crew transfer assistive device: Grab bar Assist level to toilet: Supervision or verbal cues Assist level from toilet: Supervision or verbal cues  Function - Chair/bed transfer Chair/bed transfer method: Ambulatory Chair/bed transfer assist level: Supervision or verbal cues Chair/bed  transfer assistive device: Walker Chair/bed transfer details:  Verbal cues for precautions/safety  Function - Locomotion: Wheelchair Will patient use wheelchair at discharge?: Yes Type: Manual Max wheelchair distance: 20 Assist Level: Moderate assistance (Pt 50 - 74%) Wheel 50 feet with 2 turns activity did not occur: Safety/medical concerns Wheel 150 feet activity did not occur: Safety/medical concerns Turns around,maneuvers to table,bed, and toilet,negotiates 3% grade,maneuvers on rugs and over doorsills: Yes Function - Locomotion: Ambulation Assistive device: Walker-rolling Max distance: 145f Assist level: Supervision or verbal cues Assist level: Supervision or verbal cues Assist level: Supervision or verbal cues Assist level: Supervision or verbal cues Assist level: Touching or steadying assistance (Pt > 75%)  Function - Comprehension Comprehension: Auditory Comprehension assist level: Follows complex conversation/direction with extra time/assistive device  Function - Expression Expression: Verbal Expression assist level: Expresses complex ideas: With extra time/assistive device  Function - Social Interaction Social Interaction assist level: Interacts appropriately 90% of the time - Needs monitoring or encouragement for participation or interaction.  Function - Problem Solving Problem solving assist level: Solves basic 90% of the time/requires cueing < 10% of the time  Function - Memory Memory assist level: Recognizes or recalls 90% of the time/requires cueing < 10% of the time Patient normally able to recall (first 3 days only): Current season, Location of own room, Staff names and faces, That he or she is in a hospital     Medical Problem List and Plan: 1. 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 DVT Prophylaxis/Anticoagulation: Subcutaneous  Lovenox. Monitor platelet counts and for any signs of bleeding 3. Pain Management: Tylenol as needed. No pain at this time 4. Hypertension. Monitor with increased mobility  Overall, under reasonable control 5. Neuropsych: This patient is capable of making decisions on her own behalf. 6. Skin/Wound Care: Routine skin checks 7. Fluids/Electrolytes/Nutrition: Routine I&O's. Encourage PO intake 8. Hyperlipidemia. Lipitor 9. Irritable bowel syndrome. Continue Librax,    -moving bowels daily albeit a little loose  -observe for increased nausea    10. Essential tremor. Patient on Inderal 20 mg daily prior to admission. Resumed. 11. Asthma. Continue [name] 100 mg twice a day 12.  Hypoalbuminemia- added prostat  LOS (Days) 9 A FACE TO FACE EVALUATION WAS PERFORMED  SWARTZ,ZACHARY T 11/26/2015, 8:58 AM

## 2015-11-26 NOTE — Discharge Summary (Signed)
Discharge summary job # [id number]

## 2015-11-27 ENCOUNTER — Inpatient Hospital Stay (HOSPITAL_COMMUNITY): Payer: Medicare Other | Admitting: Occupational Therapy

## 2015-11-27 ENCOUNTER — Ambulatory Visit (INDEPENDENT_AMBULATORY_CARE_PROVIDER_SITE_OTHER): Payer: Medicare Other | Admitting: *Deleted

## 2015-11-27 ENCOUNTER — Encounter: Payer: Self-pay | Admitting: Internal Medicine

## 2015-11-27 ENCOUNTER — Inpatient Hospital Stay (HOSPITAL_COMMUNITY): Payer: Medicare Other | Admitting: Physical Therapy

## 2015-11-27 ENCOUNTER — Inpatient Hospital Stay (HOSPITAL_COMMUNITY): Payer: Medicare Other | Admitting: Speech Pathology

## 2015-11-27 ENCOUNTER — Ambulatory Visit: Payer: Medicare Other

## 2015-11-27 DIAGNOSIS — J45909 Unspecified asthma, uncomplicated: Secondary | ICD-10-CM

## 2015-11-27 DIAGNOSIS — I639 Cerebral infarction, unspecified: Secondary | ICD-10-CM

## 2015-11-27 LAB — CUP PACEART INCLINIC DEVICE CHECK: MDC IDC SESS DTM: 20170703165120

## 2015-11-27 MED ORDER — ATORVASTATIN CALCIUM 20 MG PO TABS: 20.0000 mg | ORAL_TABLET | Freq: Every day | ORAL | Status: DC

## 2015-11-27 MED ORDER — PROPRANOLOL HCL 20 MG PO TABS: 20.0000 mg | ORAL_TABLET | Freq: Every day | ORAL | Status: DC

## 2015-11-27 MED ORDER — CLOPIDOGREL BISULFATE 75 MG PO TABS: 75.0000 mg | ORAL_TABLET | Freq: Every day | ORAL | Status: DC

## 2015-11-27 MED ORDER — PANTOPRAZOLE SODIUM 40 MG PO TBEC: 40.0000 mg | DELAYED_RELEASE_TABLET | Freq: Every day | ORAL | Status: DC

## 2015-11-27 NOTE — Plan of Care (Signed)
Problem: RH Ambulation Goal: LTG Patient will ambulate in controlled environment (PT) LTG: Patient will ambulate in a controlled environment, # of feet with assistance (PT).  Duplicate goal; see below

## 2015-11-27 NOTE — Progress Notes (Signed)
Physical Therapy Discharge Summary  Patient Details  Name: Carmen Duncan MRN: [id number] Date of Birth: Sep 29, 1940  Today's Date: 11/27/2015 PT Individual Time: 0800-0900 PT Individual Time Calculation (min): 60 min    Patient has met 9 of 9 long term goals due to improved activity tolerance, improved balance, improved postural control, increased strength, decreased pain, functional use of  left upper extremity and left lower extremity, improved attention, improved awareness and improved coordination.  Patient to discharge at an ambulatory level Supervision.   Patient's care partner is independent to provide the necessary intermittent supervision assistance at discharge.  Reasons goals not met: All goals met  Recommendation:  Patient will benefit from ongoing skilled PT services in outpatient setting to continue to advance safe functional mobility, address ongoing impairments in balance, coordination, postural control, activity tolerance, strength, and minimize fall risk.  Equipment: No equipment provided  Reasons for discharge: treatment goals met and discharge from hospital  Patient/family agrees with progress made and goals achieved: Yes  PT Discharge Precautions/Restrictions Precautions Precautions: Fall Restrictions Weight Bearing Restrictions: No Pain Pain Assessment Pain Assessment: No/denies pain Vision/Perception  Perception Comments: mild L inattention  Cognition Orientation Level: Oriented X4 Sensation Sensation Light Touch: Appears Intact Stereognosis: Not tested Hot/Cold: Not tested Coordination Gross Motor Movements are Fluid and Coordinated: Yes Finger Nose Finger Test: mild dysmetria and slower speed on LUE Heel Shin Test: mild decreased in speed/excursion LLE Motor  Motor Motor: Hemiplegia;Abnormal postural alignment and control Motor - Discharge Observations: improving L lateral lean in sitting/standing, L hemiparesis improved over baseline   Mobility Bed Mobility Bed Mobility: Supine to Sit;Sit to Supine Supine to Sit: 6: Modified independent (Device/Increase time) Sit to Supine: 6: Modified independent (Device/Increase time) Transfers Transfers: Yes Sit to Stand: 6: Modified independent (Device/Increase time) Stand to Sit: 6: Modified independent (Device/Increase time) Stand Pivot Transfers: 6: Modified independent (Device/Increase time) Locomotion  Ambulation Ambulation: Yes Ambulation/Gait Assistance: 6: Modified independent (Device/Increase time) Ambulation Distance (Feet): 175 Feet Assistive device: Rolling walker Ambulation/Gait Assistance Details: RW due to fatigue with longer distance ambulation Gait Gait: Yes Gait Pattern: Impaired Gait Pattern: Lateral trunk lean to left;Trendelenburg Stairs / Additional Locomotion Stairs: Yes Stairs Assistance: 5: Supervision Stairs Assistance Details: Verbal cues for technique Stair Management Technique: One rail Right;Alternating pattern;Forwards Number of Stairs: 12 Height of Stairs: 6 Ramp: 5: Supervision Curb: 5: Supervision Wheelchair Mobility Wheelchair Mobility: No  Trunk/Postural Assessment  Cervical Assessment Cervical Assessment: Exceptions to [facility] (forward head posture) Thoracic Assessment Thoracic Assessment: Exceptions to [facility] (increased thoracic kyphosis) Lumbar Assessment Lumbar Assessment: Exceptions to [facility] (posterior pelvic tilt) Postural Control Postural Control: Deficits on evaluation Trunk Control: L lateral lean in standing, worse when fatigued  Balance Balance Balance Assessed: Yes Standardized Balance Assessment Standardized Balance Assessment: Berg Balance Test Berg Balance Test Sit to Stand: Able to stand without using hands and stabilize independently Standing Unsupported: Able to stand safely 2 minutes Sitting with Back Unsupported but Feet Supported on Floor or Stool: Able to sit safely and securely 2 minutes Stand to Sit: Sits  safely with minimal use of hands Transfers: Able to transfer safely, minor use of hands Standing Unsupported with Eyes Closed: Able to stand 10 seconds safely Standing Ubsupported with Feet Together: Able to place feet together independently and stand 1 minute safely From Standing, Reach Forward with Outstretched Arm: Can reach confidently >25 cm (10") From Standing Position, Pick up Object from Floor: Able to pick up shoe safely and easily From Standing Position, Turn to  Look Behind Over each Shoulder: Looks behind from both sides and weight shifts well Turn 360 Degrees: Able to turn 360 degrees safely one side only in 4 seconds or less Standing Unsupported, Alternately Place Feet on Step/Stool: Able to complete >2 steps/needs minimal assist Standing Unsupported, One Foot in Front: Able to plae foot ahead of the other independently and hold 30 seconds Standing on One Leg: Tries to lift leg/unable to hold 3 seconds but remains standing independently Total Score: 48 Static Sitting Balance Static Sitting - Balance Support: Right upper extremity supported;Left upper extremity supported Static Sitting - Level of Assistance: 6: Modified independent (Device/Increase time) Dynamic Sitting Balance Dynamic Sitting - Balance Support: No upper extremity supported Dynamic Sitting - Level of Assistance: 6: Modified independent (Device/Increase time) Static Standing Balance Static Standing - Balance Support: No upper extremity supported Static Standing - Level of Assistance: 6: Modified independent (Device/Increase time) Dynamic Standing Balance Dynamic Standing - Balance Support: No upper extremity supported Dynamic Standing - Level of Assistance: 5: Stand by assistance Dynamic Standing - Balance Activities: Lateral lean/weight shifting;Forward lean/weight shifting;Reaching for objects;Reaching across midline Extremity Assessment   BUE WFL for mobility tasks; mild LUE incoordination. Defer to OT  examination   RLE Assessment RLE Assessment: Within Functional Limits RLE Strength RLE Overall Strength Comments: grossly 4/5 to 4+/5 throughout LLE Assessment LLE Assessment: Exceptions to [facility] LLE AROM (degrees) Overall AROM Left Lower Extremity: Deficits LLE Strength LLE Overall Strength Comments: grossly 4-/5 throughout, ankle dorsiflexion 4/5  Skilled Therapeutic Intervention: Pt received supine in bed, denies pain and agreeable to treatment. Bed mobility with modI; ambulation within room modI while performing dressing, bathroom, and hygiene standing at sink. Assessed all mobility as described below with mod I/S overall. No family present for session, so this therapist has initiated a handout for all therapists to fill out and send home with pt. Recommend pt have supervision for stairs initially, and for community mobility; pt states understanding. Patient demonstrates increased fall risk as noted by score of   48/56 on Berg Balance Scale.  (<36= high risk for falls, close to 100%; 37-45 significant >80%; 46-51 moderate >50%; 52-55 lower >25%). Pt remained seated in recliner at completion of session, no further questions or concerns regarding d/c to home.    See Function Navigator for Current Functional Status.  Benjiman Core Tygielski 11/27/2015, 9:01 AM

## 2015-11-27 NOTE — Discharge Instructions (Signed)
Inpatient Rehab Discharge Instructions  Carmen Duncan Discharge date and time: No discharge date for patient encounter.   Activities/Precautions/ Functional Status: Activity: activity as tolerated Diet: regular diet Wound Care: none needed Functional status:  ___ No restrictions     ___ Walk up steps independently ___ 24/7 supervision/assistance   ___ Walk up steps with assistance ___ Intermittent supervision/assistance  ___ Bathe/dress independently ___ Walk with walker     _x STROKE/TIA DISCHARGE INSTRUCTIONS SMOKING Cigarette smoking nearly doubles your risk of having a stroke & is the single most alterable risk factor  If you smoke or have smoked in the last 12 months, you are advised to quit smoking for your health.  Most of the excess cardiovascular risk related to smoking disappears within a year of stopping.  Ask you doctor about anti-smoking medications  [name] Quit Line: 1-800-QUIT NOW  Free Smoking Cessation Classes [phone]  CHOLESTEROL Know your levels; limit fat & cholesterol in your diet  Lipid Panel     Component Value Date/Time   CHOL 132 11/15/2015 0516   TRIG 166* 11/15/2015 0516   HDL 35* 11/15/2015 0516   CHOLHDL 3.8 11/15/2015 0516   VLDL 33 11/15/2015 0516   LDLCALC 64 11/15/2015 0516      Many patients benefit from treatment even if their cholesterol is at goal.  Goal: Total Cholesterol (CHOL) less than 160  Goal:  Triglycerides (TRIG) less than 150  Goal:  HDL greater than 40  Goal:  LDL (LDLCALC) less than 100   BLOOD PRESSURE American Stroke Association blood pressure target is less that 120/80 mm/Hg  Your discharge blood pressure is:  BP: (!) 145/61 mmHg  Monitor your blood pressure  Limit your salt and alcohol intake  Many individuals will require more than one medication for high blood pressure  DIABETES (A1c is a blood sugar average for last 3 months) Goal HGBA1c is under 7% (HBGA1c is blood sugar average for last 3 months)    Diabetes: No known diagnosis of diabetes    Lab Results  Component Value Date   HGBA1C 5.4 11/15/2015     Your HGBA1c can be lowered with medications, healthy diet, and exercise.  Check your blood sugar as directed by your physician  Call your physician if you experience unexplained or low blood sugars.  PHYSICAL ACTIVITY/REHABILITATION Goal is 30 minutes at least 4 days per week  Activity: Increase activity slowly, Therapies: Physical Therapy: Home Health Return to work:   Activity decreases your risk of heart attack and stroke and makes your heart stronger.  It helps control your weight and blood pressure; helps you relax and can improve your mood.  Participate in a regular exercise program.  Talk with your doctor about the best form of exercise for you (dancing, walking, swimming, cycling).  DIET/WEIGHT Goal is to maintain a healthy weight  Your discharge diet is: Diet Heart Room service appropriate?: Yes; Fluid consistency:: Thin  liquids Your height is:  Height: 5\' 1"  (154.9 cm) Your current weight is: Weight: 55.6 kg (122 lb 9.2 oz) Your Body Mass Index (BMI) is:     Following the type of diet specifically designed for you will help prevent another stroke.  Your goal weight range is:    Your goal Body Mass Index (BMI) is 19-24.  Healthy food habits can help reduce 3 risk factors for stroke:  High cholesterol, hypertension, and excess weight.  RESOURCES Stroke/Support Group:  Call [place]  TO PATIENT Stroke warning signs and symptoms How to activate emergency medical system (call 911). Medications prescribed at discharge. Need for follow-up after discharge. Personal risk factors for stroke. Pneumonia vaccine given:  Flu vaccine given:  My questions have been answered, the writing is legible, and I understand these instructions.  I will adhere to these goals & educational materials that have been provided to me after  my discharge from the hospital.    __ Bathe/dress with assistance ___ Walk Independently    ___ Shower independently ___ Walk with assistance    ___ Shower with assistance ___ No alcohol     ___ Return to work/school ________  Special Instructions:    COMMUNITY REFERRALS UPON DISCHARGE:    Home Health:   PT, OT, SP, RN    [place]   Date of last service:11/27/2015  Medical Equipment/Items Ordered:NONE HAD FROM PREVIOUS ADMIT   GENERAL COMMUNITY RESOURCES FOR PATIENT/FAMILY: Support Groups:[facility] SUPPORT GROUP EVERY SECOND Thursday @ 3;00-4:00 PM ON THE REHAB UNIT QUESTIONS CONTACT KATIE [phone]  My questions have been answered and I understand these instructions. I will adhere to these goals and the provided educational materials after my discharge from the hospital.  Patient/Caregiver Signature _______________________________ Date __________  Clinician Signature _______________________________________ Date __________  Please bring this form and your medication list with you to all your follow-up doctor's appointments.

## 2015-11-27 NOTE — Progress Notes (Signed)
Discharged to home accompanied by daughter in law. Scripts faxed to pharmacy per request. Reviewed discharge instructions per [place] PAC with daughter in law and appointment at [facility] on [address] at 430 p.m. Today. Reviewed instructions for discharge with heart healthy diet/meds. States an understanding of information reviewed. Taken down to car with belongings per NT. Margarito Liner

## 2015-11-27 NOTE — Progress Notes (Signed)
Speech Language Pathology Discharge Summary  Patient Details  Name: Carmen Duncan MRN: [id number] Date of Birth: 1940-06-03  Today's Date: 11/27/2015 SLP Individual Time: 1300-1350 SLP Individual Time Calculation (min): 50 min   Skilled Therapeutic Interventions:  Pt was seen for skilled ST targeting cognitive goals and completion of education prior to discharge.  Therapist facilitated the session with a medication management task targeting functional problem solving.  Pt required supervision verbal cues for organization and working memory to achieve 100% accuracy when loading pills into a BID pill box during the abovementioned task.  Pt recalled recommendation from previous therapy session that son assist in medication management at discharge with supervision question cues.  Pt was provided with a handout of therapy recommendations to facilitate carryover in the home environment.  All questions were answered to pt's satisfaction at this time.  Pt is ready for discharge this afternoon.       Patient has met 2 of 2 long term goals.  Patient to discharge at overall Supervision level.  Reasons goals not met:     Clinical Impression/Discharge Summary:  Pt made functional gains while inpatient and is discharging having met 2 out of 2 long term goals.  Pt has demonstrated improved use of a pill box as an organizational aid to facilitate more accurate medication management in the home environment.  Pt currently requires supervision cues during tasks due to higher level memory and problem solving impairment.  As a result, recommend that pt have assistance for medication and financial management at discharge in addition to ongoing ST follow up at next level of care.  Pt and family education is complete at this time.  Pt is discharging home with intermittent supervision from family.    Care Partner:  Caregiver Able to Provide Assistance: Yes  Type of Caregiver Assistance:  Physical;Cognitive  Recommendation:  Outpatient SLP  Rationale for SLP Follow Up: Maximize cognitive function and independence   Equipment: none recommended by SLP    Reasons for discharge: Discharged from hospital   Patient/Family Agrees with Progress Made and Goals Achieved: Yes   Function:  Eating Eating                Cognition Comprehension Comprehension assist level: Follows complex conversation/direction with no assist  Expression   Expression assist level: Expresses complex ideas: With no assist  Social Interaction Social Interaction assist level: Interacts appropriately with others - No medications needed.  Problem Solving Problem solving assist level: Solves basic 90% of the time/requires cueing < 10% of the time  Memory Memory assist level: Recognizes or recalls 90% of the time/requires cueing < 10% of the time   [name] 11/27/2015, 1:59 PM

## 2015-11-27 NOTE — Progress Notes (Signed)
Occupational Therapy Session Note  Patient Details  Name: Carmen Duncan MRN: [id number] Date of Birth: Sep 18, 1940  Today's Date: 11/27/2015 OT Individual Time: 7793-9688 OT Individual Time Calculation (min): 75 min    Short Term Goals: Week 1:  OT Short Term Goal 1 (Week 1): STGs equal to LTGs based on ELOS OT Short Term Goal 1 - Progress (Week 1): Progressing toward goal  Skilled Therapeutic Interventions/Progress Updates:    Treatment session with focus on completing self care activities and IADL performance/simulation. Pt completed showering and LB dressing at sit to stand in bathroom as safety measures due to pt report of increased fatigue. Pt demonstrates good fine and gross motor skills for ADL and IADL completion. Pt completed transfers and ambulation using RW with verbal cues as reminders for RW navigation, use, and safety. Pt required x2 seated rest breaks for reported fatigue while ambulating >300 ft to laundry room. Verbal cues and education provided to pt to decrease speed while ambulating for improved safety and energy conservation. Engaged in light housekeeping and meal prep activities (making bed and cooking egg on stove) for IADL performance in anticipation of d/c home. Met pt son in pt's room at end of session. Provided brief education and notified of handout to accompany pt home with recommendations for continued care and for increased pt safety at home. Pt and son verbalized understanding. Pt left in room with all needs in reach.    Therapy Documentation Precautions:  Precautions Precautions: Fall Restrictions Weight Bearing Restrictions: No General:   Vital Signs:  Pain:   Pt reports no pain.   See Function Navigator for Current Functional Status.   Therapy/Group: Individual Therapy  [name] 11/27/2015, 12:32 PM

## 2015-11-27 NOTE — Progress Notes (Signed)
Loop wound check in clinic. Gauze/tegaderm removed. Small scab noted at incision. Patient instructed to wash in a shower, avoid tub baths until scab is off and skin is closed. No redness, swelling or drainage noted. Patient educated on Care[facility] monitoring. Battery status: good. No episodes recorded. Monthly summary reports and ROV with JA PRN.

## 2015-11-27 NOTE — Telephone Encounter (Signed)
Patient has been admitted in the hospital since 11/17/15- [facility] FYI to Dr. Elsworth Soho

## 2015-11-27 NOTE — Progress Notes (Signed)
Social Work  Discharge Note  The overall goal for the admission was met for:   Discharge location: Yes-HOME WITH SON AND DAUGHTER IN-LAW  Length of Stay: Yes-9 DAYS  Discharge activity level: Yes-MOD/I-SUPERVISION LEVEL  Home/community participation: Yes  Services provided included: MD, RD, PT, OT, SLP, RN, CM, TR, Pharmacy and SW  Financial Services: Private Insurance: BLUE MEDICARE  Follow-up services arranged: Home Health: [facility]-PT,OT,RN,SP and Patient/Family has no preference for HH/DME agencies  Comments (or additional information):PT DOING WELL REACHING SUPERVISION/MOD/I LEVEL. INSURANCE HAS DENIED COVERAGE TO 7/5 AND REPORTS LAST COVERED DAY 7/3. PT AND FAMILY MADE AWARE AND PLAN ON BEING HERE Monday FOR DISCHARGE. PT HAS ALL NEEDED EQUIPMENT FOR HOME  Patient/Family verbalized understanding of follow-up arrangements: Yes  Individual responsible for coordination of the follow-up plan: MITCH-SON  Confirmed correct DME delivered: ,  G 11/27/2015    ,  G 

## 2015-11-27 NOTE — Progress Notes (Signed)
Occupational Therapy Discharge Summary  Patient Details  Name: Carmen Duncan MRN: [id number] Date of Birth: 08-16-1940   Patient has met 33 of 14 long term goals due to improved activity tolerance, improved balance, postural control, ability to compensate for deficits, improved awareness and improved coordination.  Patient to discharge at overall Supervision level.  Patient's care partner is independent to provide the necessary physical and cognitive assistance at discharge.    Reasons goals not met: N/A  Recommendation:  Patient will benefit from ongoing skilled OT services in home health setting to continue to advance functional skills in the area of BADL, iADL and Reduce care partner burden.  Equipment: No equipment provided  Reasons for discharge: treatment goals met and discharge from hospital  Patient/family agrees with progress made and goals achieved: Yes  OT Discharge Precautions/Restrictions  Precautions Precautions: Fall Restrictions Weight Bearing Restrictions: No General   Vital Signs Therapy Vitals Temp: 97.7 F (36.5 C) Temp Source: Oral Pulse Rate: 81 Resp: 18 BP: (!) 129/52 mmHg Patient Position (if appropriate): Sitting Oxygen Therapy SpO2: 99 % O2 Device: Not Delivered Pain Pain Assessment Pain Assessment: No/denies pain ADL   Vision/Perception  Vision- History Baseline Vision/History: Wears glasses Wears Glasses: Reading only Patient Visual Report: No change from baseline Vision- Assessment Vision Assessment?: No apparent visual deficits Ocular Range of Motion: Within Functional Limits Perception Comments: mild L inattention  Cognition Overall Cognitive Status: Impaired/Different from baseline Arousal/Alertness: Awake/alert Attention: Selective Selective Attention: Appears intact Memory: Impaired Memory Impairment: Decreased recall of new information Awareness: Appears intact Executive Function: Self Monitoring;Self Correcting Self  Monitoring: Impaired Self Monitoring Impairment: Functional complex Self Correcting: Impaired Self Correcting Impairment: Functional complex Safety/Judgment: Appears intact Sensation Sensation Light Touch: Appears Intact Stereognosis: Not tested Hot/Cold: Not tested Proprioception: Impaired by gross assessment Proprioception Impaired Details: Impaired LUE Coordination Gross Motor Movements are Fluid and Coordinated: Yes Fine Motor Movements are Fluid and Coordinated: No Coordination and Movement Description: Pt uses the LUE functionally for all selfcare tasks but with decreased speed and decreased FM coordination. Finger Nose Finger Test: mild dysmetria and slower speed on LUE Motor    Mobility     Trunk/Postural Assessment  Cervical Assessment Cervical Assessment: Exceptions to [facility] (forward head posture) Thoracic Assessment Thoracic Assessment: Exceptions to [facility] (increased thoracic kyphosis) Lumbar Assessment Lumbar Assessment: Exceptions to [facility] (posterior pelvic tilt) Postural Control Postural Control: Deficits on evaluation Trunk Control: L lateral lean in standing, worse when fatigued  Balance   Extremity/Trunk Assessment RUE Assessment RUE Assessment: Within Functional Limits RUE AROM (degrees) Overall AROM Right Upper Extremity: Within functional limits for tasks performed RUE Overall AROM Comments: Pt with isolated movement in all joints with shoulder AROM 0-120 degrees.  All other joints AROM WFLS with overall strength 3/5 at the shoulder and 3+/5 at the elbow and for grip.  Pt able to oppose thumb to all digits.  LUE Assessment LUE Assessment: Exceptions to WFL LUE AROM (degrees) Overall AROM Left Upper Extremity: Deficits LUE Overall AROM Comments: Pt with isolated movement in all joints with shoulder AROM 0-120 degrees.  All other joints AROM WFLS with overall strength 3/5 at the shoulder and 3+/5 at the elbow and for grip.  Pt able to oppose thumb to all digits  but with noted effort in L versus R hand.   See Function Navigator for Current Functional Status.  Carmen Duncan 11/27/2015, 4:18 PM

## 2015-11-27 NOTE — Progress Notes (Signed)
Subjective/Complaints: Pt sitting at the edge of the bed, getting dressed.  She is looking forward to going home.   ROS- Denies CP, SOB, pain over loop site,no N/V/D  Objective: Vital Signs: Blood pressure 139/56, pulse 72, temperature 97.7 F (36.5 C), temperature source Oral, resp. rate 17, height 5\' 1"  (1.549 m), weight 55.6 kg (122 lb 9.2 oz), SpO2 100 %. No results found. No results found for this or any previous visit (from the past 72 hour(s)).   HEENT: Normocephalic, atraumatic Cardio: RRR  Resp: CTA B/L GI: BS positive, soft, non-tender Skin:   Intact. Warm and dry Neuro: Alert/Oriented  Motor 4+-5/5 LUE, 4+-5/5 LLE, 5/5 on Right side 5/5 proximal to distal. Musc/Skel:  No edema.  No tenderness.  Gen NAD. Vital signs reviewed.   Assessment/Plan: 1. 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 Physiatrist is providing close team supervision and 24 hour management of active medical problems listed below. Physiatrist and rehab team continue to assess barriers to discharge/monitor patient progress toward functional and medical goals. FIM: Function - Bathing Position: Shower Body parts bathed by patient: Right arm, Left arm, Chest, Abdomen, Front perineal area, Buttocks, Right upper leg, Left upper leg, Right lower leg, Left lower leg Body parts bathed by helper: Back Assist Level: Supervision or verbal cues  Function- Upper Body Dressing/Undressing What is the patient wearing?: Pull over shirt/dress, Bra Bra - Perfomed by patient: Thread/unthread left bra strap, Thread/unthread right bra strap, Hook/unhook bra (pull down sports bra) Pull over shirt/dress - Perfomed by patient: Thread/unthread right sleeve, Thread/unthread left sleeve, Put head through opening, Pull shirt over trunk Assist Level:  Supervision or verbal cues Function - Lower Body Dressing/Undressing What is the patient wearing?: Shoes, Pants, Underwear Position: Wheelchair/chair at sink Underwear - Performed by patient: Thread/unthread right underwear leg, Thread/unthread left underwear leg, Pull underwear up/down Pants- Performed by patient: Thread/unthread right pants leg, Thread/unthread left pants leg, Pull pants up/down, Fasten/unfasten pants Pants- Performed by helper: Fasten/unfasten pants Non-skid slipper socks- Performed by patient:  (did not wear socks today) Socks - Performed by patient: Don/doff right sock, Don/doff left sock Shoes - Performed by patient: Don/doff right shoe, Don/doff left shoe, Fasten right, Fasten left Shoes - Performed by helper: Fasten right, Fasten left Assist for footwear: Supervision/touching assist Assist for lower body dressing: Supervision or verbal cues  Function - Toileting Toileting steps completed by patient: Adjust clothing prior to toileting, Performs perineal hygiene, Adjust clothing after toileting Toileting Assistive Devices: Grab bar or rail Assist level: Supervision or verbal cues  Function - Toilet Transfers Toilet transfer assistive device: Grab bar Assist level to toilet: Supervision or verbal cues Assist level from toilet: Supervision or verbal cues  Function - Chair/bed transfer Chair/bed transfer method: Ambulatory Chair/bed transfer assist level: No Help, no cues, assistive device, takes more than a reasonable amount of time Chair/bed transfer assistive device: Walker Chair/bed transfer details: Verbal cues for precautions/safety  Function - Locomotion: Wheelchair Will patient use wheelchair at discharge?: No Type: Manual Max wheelchair distance: 20 Assist Level: Moderate assistance (Pt 50 - 74%) Wheel 50 feet with 2 turns activity did not occur: Safety/medical concerns Wheel 150 feet activity did not occur: Safety/medical concerns Turns  around,maneuvers to table,bed, and toilet,negotiates 3% grade,maneuvers on rugs and over doorsills: Yes Function - Locomotion: Ambulation Assistive device: Walker-rolling Max distance: 175 Assist level: No help, No  cues, assistive device, takes more than a reasonable amount of time Assist level: No help, No cues, assistive device, takes more than a reasonable amount of time Assist level: No help, No cues, assistive device, takes more than a reasonable amount of time Assist level: No help, No cues, assistive device, takes more than a reasonable amount of time Assist level: Supervision or verbal cues  Function - Comprehension Comprehension: Auditory Comprehension assist level: Follows complex conversation/direction with extra time/assistive device  Function - Expression Expression: Verbal Expression assist level: Expresses complex ideas: With extra time/assistive device  Function - Social Interaction Social Interaction assist level: Interacts appropriately 90% of the time - Needs monitoring or encouragement for participation or interaction.  Function - Problem Solving Problem solving assist level: Solves basic 90% of the time/requires cueing < 10% of the time  Function - Memory Memory assist level: Recognizes or recalls 90% of the time/requires cueing < 10% of the time Patient normally able to recall (first 3 days only): Current season, Location of own room, Staff names and faces, That he or she is in a hospital     Medical Problem List and Plan: 1. 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 Letta Pate for transitional care management in 1-2 weeks.  2. DVT Prophylaxis/Anticoagulation: Subcutaneous Lovenox. Monitor platelet counts and for any signs of bleeding 3. Pain Management: Tylenol as needed. No pain at this time 4. Hypertension. Monitor  with increased mobility  Overall, under good control 5. Neuropsych: This patient is capable of making decisions on her own behalf. 6. Skin/Wound Care: Routine skin checks 7. Fluids/Electrolytes/Nutrition: Routine I&O's. Encourage PO intake 8. Hyperlipidemia. Lipitor 9. Irritable bowel syndrome. Continue Librax,    -moving bowels daily albeit a little loose  -observe for increased nausea    10. Essential tremor. Patient on Inderal 20 mg daily prior to admission. Resumed. 11. Asthma. Continue [name] 100 mg twice a day 12.  Hypoalbuminemia- added prostat  LOS (Days) 10 A FACE TO FACE EVALUATION WAS PERFORMED  Ankit Lorie Phenix 11/27/2015, 9:07 AM

## 2015-11-28 DIAGNOSIS — I10 Essential (primary) hypertension: Secondary | ICD-10-CM | POA: Diagnosis not present

## 2015-11-28 DIAGNOSIS — R278 Other lack of coordination: Secondary | ICD-10-CM | POA: Diagnosis not present

## 2015-11-28 DIAGNOSIS — Z853 Personal history of malignant neoplasm of breast: Secondary | ICD-10-CM | POA: Diagnosis not present

## 2015-11-28 DIAGNOSIS — F419 Anxiety disorder, unspecified: Secondary | ICD-10-CM | POA: Diagnosis not present

## 2015-11-28 DIAGNOSIS — K219 Gastro-esophageal reflux disease without esophagitis: Secondary | ICD-10-CM | POA: Diagnosis not present

## 2015-11-28 DIAGNOSIS — J8410 Pulmonary fibrosis, unspecified: Secondary | ICD-10-CM | POA: Diagnosis not present

## 2015-11-28 DIAGNOSIS — E785 Hyperlipidemia, unspecified: Secondary | ICD-10-CM | POA: Diagnosis not present

## 2015-11-28 DIAGNOSIS — J45909 Unspecified asthma, uncomplicated: Secondary | ICD-10-CM | POA: Diagnosis not present

## 2015-11-28 DIAGNOSIS — I69398 Other sequelae of cerebral infarction: Secondary | ICD-10-CM | POA: Diagnosis not present

## 2015-11-29 NOTE — Telephone Encounter (Signed)
lmtcb x3 for Ashley  

## 2015-11-30 ENCOUNTER — Telehealth: Payer: Self-pay

## 2015-11-30 NOTE — Telephone Encounter (Signed)
We have reached out to [facility] several times with no response. Message will be closed per triage protocol.

## 2015-11-30 NOTE — Telephone Encounter (Signed)
Spoke with pt's daughter.  1. Are you/is patient experiencing any problems since coming home? Are there any questions regarding any aspect of care? No issues.  2. Are there any questions regarding medications administration/dosing? Are meds being taken as prescribed? Patient should review meds with caller to confirm. Meds have been confirmed.  3. Have there been any falls? No falls 4. Has Home Health been to the house and/or have they contacted you? If not, have you tried to contact them? Can we help you contact them? Home Health has been in contact.  5. Are bowels and bladder emptying properly? Are there any unexpected incontinence issues? If applicable, is patient following bowel/bladder programs? No issues. 6. Any fevers, problems with breathing, unexpected pain? No problems  7. Are there any skin problems or new areas of breakdown? No  8. Has the patient/family member arranged specialty MD follow up (ie cardiology/neurology/renal/surgical/etc)?  Can we help arrange? Pt has some follow up appointments.  9. Does the patient need any other services or support that we can help arrange? No 10. Are caregivers following through as expected in assisting the patient? Yes, daughter and grandchildren.  11. Has the patient quit smoking, drinking alcohol, or using drugs as recommended? Pt is not smoking, drinking alcohol or using drugs.   Pt's daughter is aware of appointment on 12/05/15 @ 9:45 am.

## 2015-12-01 ENCOUNTER — Ambulatory Visit: Payer: Medicare Other

## 2015-12-01 ENCOUNTER — Telehealth: Payer: Self-pay | Admitting: Cardiology

## 2015-12-01 NOTE — Telephone Encounter (Signed)
Spoke w/ pt daughter and requested that pt send a manual transmission b/c pt home monitor has not updated in at least 14 days.  

## 2015-12-04 NOTE — Telephone Encounter (Signed)
Please send a letter to [facility]  Reference- appeal for denial of [name]  MsMoore had a high resolution CT done  which showed idiopathic pulmonary fibrosis. She does have symptoms compatible with this and should be started on anti-fibrotic therapy. She does not have an alternative explanation for the fibrosis. I'm requesting  to review your decision regarding [name] denial  Sincerely   Kara Mead MD  Please fax to [phone]  This can be followed up with a phone call in one week to [phone]  Please attach last CT scan

## 2015-12-05 ENCOUNTER — Ambulatory Visit (HOSPITAL_BASED_OUTPATIENT_CLINIC_OR_DEPARTMENT_OTHER): Payer: Medicare Other | Admitting: Physical Medicine & Rehabilitation

## 2015-12-05 ENCOUNTER — Encounter: Payer: Medicare Other | Attending: Physical Medicine & Rehabilitation

## 2015-12-05 ENCOUNTER — Encounter: Payer: Self-pay | Admitting: Physical Medicine & Rehabilitation

## 2015-12-05 VITALS — BP 118/69 | HR 82 | Resp 14

## 2015-12-05 DIAGNOSIS — C50411 Malignant neoplasm of upper-outer quadrant of right female breast: Secondary | ICD-10-CM | POA: Diagnosis not present

## 2015-12-05 DIAGNOSIS — M791 Myalgia: Secondary | ICD-10-CM | POA: Insufficient documentation

## 2015-12-05 DIAGNOSIS — M542 Cervicalgia: Secondary | ICD-10-CM | POA: Insufficient documentation

## 2015-12-05 DIAGNOSIS — I639 Cerebral infarction, unspecified: Secondary | ICD-10-CM | POA: Diagnosis not present

## 2015-12-05 DIAGNOSIS — I69359 Hemiplegia and hemiparesis following cerebral infarction affecting unspecified side: Secondary | ICD-10-CM | POA: Diagnosis not present

## 2015-12-05 DIAGNOSIS — M1990 Unspecified osteoarthritis, unspecified site: Secondary | ICD-10-CM | POA: Insufficient documentation

## 2015-12-05 DIAGNOSIS — E785 Hyperlipidemia, unspecified: Secondary | ICD-10-CM | POA: Diagnosis not present

## 2015-12-05 DIAGNOSIS — K219 Gastro-esophageal reflux disease without esophagitis: Secondary | ICD-10-CM | POA: Diagnosis not present

## 2015-12-05 DIAGNOSIS — J45909 Unspecified asthma, uncomplicated: Secondary | ICD-10-CM | POA: Insufficient documentation

## 2015-12-05 DIAGNOSIS — I10 Essential (primary) hypertension: Secondary | ICD-10-CM | POA: Diagnosis not present

## 2015-12-05 DIAGNOSIS — Z8673 Personal history of transient ischemic attack (TIA), and cerebral infarction without residual deficits: Secondary | ICD-10-CM | POA: Diagnosis not present

## 2015-12-05 DIAGNOSIS — C7931 Secondary malignant neoplasm of brain: Secondary | ICD-10-CM | POA: Insufficient documentation

## 2015-12-05 NOTE — Telephone Encounter (Signed)
Letter faxed to [facility] with copy of CT scan. Will contact [employer] for follow up. Hold in box, I will close encounter once completed.

## 2015-12-05 NOTE — Patient Instructions (Signed)
Graduated return to driving instructions were provided. It is recommended that the patient first drives with another licensed driver in an empty parking lot. If the patient does well with this, and they can drive on a quiet street with the licensed driver. If the patient does well with this they can drive on a busy street with a licensed driver. If the patient does well with this, the next time out they can go by himself. For the first month after resuming driving, I recommend no nighttime or Interstate driving.    We also discussed getting a theraband wrapping it around a doorknob and doing rowing motions to build up the upper back strength Also recommend either icy hot or similar sports cream to the left shoulder area or patch  Also recommend heating pad 20-30 minutes several times a day as needed  If this is not helpful I would recommend trigger point injection and perhaps some outpatient physical therapy. Please call for appointment if you wish to pursue this

## 2015-12-05 NOTE — Progress Notes (Signed)
Subjective:    Patient ID: Carmen Duncan, female    DOB: Apr 01, 1941, 75 y.o.   MRN: [id number] 75 year old right-handed female with history of hypertension, hyperlipidemia, breast cancer with resection and radiation as well as essential tremor, remote stroke in the past without residual weakness, maintained on Plavix.  Lives with son and family.  Independent prior to admission.  Presented on November 14, 2015, with left-sided weakness.  MRI of the brain showed acute 1 cm right corona radiata and right frontal periventricular white matter infarct.  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  MRA of the head with no emergent large vessel occlusion.  TEE with ejection fraction of 60%, no thrombus and a loop recorder was placed on November 16, 2015.  The patient did not receive tPA.  Neurology consulted, continued to be maintained on Plavix therapy.  DATE OF ADMISSION:  11/17/2015 DATE OF DISCHARGE:  11/27/2015  HPI Patient discharged to her son and daughter-in-law's home. Patient has received home health PT evaluation but was discharged, also received home health OT evaluation but was discharged. Patient feels she is more or less back to the prior independent level. Daughter-in-law indicates that movements are slower. Patient is interested in returning to driving.  Patient is not using any assisted device. Home health recommended shower chair. Has not purchased this yet.  Complaining of pain in the back of the neck going into the shoulder blade area on the right side. This has been going on prior to the stroke. Patient has not sought any medical treatment for this. Denies any numbness or tingling shooting into the right arm. No pain with neck range of motion.  Pain Inventory Average Pain 7 Pain Right Now 8 My pain is dull and aching  In the last 24 hours, has pain interfered with the following? General activity 1 Relation with others 1 Enjoyment of life 1 What TIME of day is your pain at its worst?  all Sleep (in general) Fair  Pain is worse with: some activites Pain improves with: medication Relief from Meds: 10  Mobility walk without assistance walk with assistance use a cane ability to climb steps?  yes  Function retired Do you have any goals in this area?  no  Neuro/Psych weakness tremor trouble walking confusion anxiety  Prior Studies hospital f/u  Physicians involved in your care Primary care Dr. Elease Hashimoto hospital f/u   Family History  Problem Relation Age of Onset  . Hyperlipidemia Mother   . Hypertension Mother   . Hyperlipidemia Father   . Heart disease Father 28  . Stroke Father   . Lung cancer Paternal Uncle     x 4   Social History   Social History  . Marital Status: Widowed    Spouse Name: N/A  . Number of Children: N/A  . Years of Education: N/A   Social History Main Topics  . Smoking status: Never Smoker   . Smokeless tobacco: Never Used  . Alcohol Use: 0.0 oz/week    0 Standard drinks or equivalent per week     Comment: wine about 3 times a week  . Drug Use: No  . Sexual Activity:    Partners: Female   Other Topics Concern  . None   Social History Narrative   Past Surgical History  Procedure Laterality Date  . Cholecystectomy  2011  . Abdominal hysterectomy  1980  . Tonsillectomy  1945  . Biopsy breast    . Eyes  2007    cararacts  .  Tee without cardioversion N/A 09/05/2014    Procedure: TRANSESOPHAGEAL ECHOCARDIOGRAM (TEE);  Surgeon: Lelon Perla, MD;  Location: [facility];  Service: Cardiovascular;  Laterality: N/A;  . Breast lumpectomy with radioactive seed and sentinel lymph node biopsy Right 08/10/2015    Procedure: BREAST LUMPECTOMY WITH RADIOACTIVE SEED AND SENTINEL LYMPH NODE BIOPSY;  Surgeon: Autumn Messing III, MD;  Location: [place];  Service: General;  Laterality: Right;  . Ep implantable device N/A 11/16/2015    Procedure: Loop Recorder Insertion;  Surgeon: [name], MD;  Location: [place] [facility];  Service: Cardiovascular;  Laterality: N/A;  . Tee without cardioversion N/A 11/16/2015    Procedure: TRANSESOPHAGEAL ECHOCARDIOGRAM (TEE);  Surgeon: Larey Dresser, MD;  Location: [facility] ENDOSCOPY;  Service: Cardiovascular;  Laterality: N/A;   Past Medical History  Diagnosis Date  . Arthritis   . Asthma   . Hypertension   . GERD (gastroesophageal reflux disease)   . Hyperlipidemia   . Diverticulosis   . IBS (irritable bowel syndrome)   . HOH (hard of hearing) left ear  . Acoustic neuroma ([place])   . Ejection fraction   . Breast cancer of upper-outer quadrant of right female breast ([place]) 07/19/2015  . Brain cancer ([place])   . Anxiety   . Stroke [facility])     no deficits, on plavix  . Essential tremor     on inderal  . Pneumonia     04-2014, CAP  . Interstitial lung disease ([facility])    BP 118/69 mmHg  Pulse 82  Resp 14  SpO2 96%  Opioid Risk Score:   Fall Risk Score:  `1  Depression screen PHQ 2/9  Depression screen [facility] 2/9 02/01/2015 07/23/2013 01/12/2013  Decreased Interest 0 0 0  Down, Depressed, Hopeless 0 0 0  PHQ - 2 Score 0 0 0     Review of Systems  Constitutional: Positive for unexpected weight change.  HENT: Negative.   Eyes: Negative.   Respiratory: Negative.   Cardiovascular: Negative.   Gastrointestinal: Negative.   Endocrine: Negative.   Genitourinary: Negative.   Musculoskeletal: Positive for gait problem.  Skin: Negative.   Allergic/Immunologic: Negative.   Neurological: Positive for tremors and weakness.  Hematological: Negative.   Psychiatric/Behavioral: Positive for confusion. The patient is nervous/anxious.   All other systems reviewed and are negative.      Objective:   Physical Exam  Constitutional: She is oriented to person, place, and time. She appears well-developed and well-nourished.  HENT:  Head: Normocephalic and atraumatic.  Eyes: Conjunctivae and EOM are normal. Pupils are equal, round, and reactive to light.  Visual  fields intact to confrontation testing  Neck: Normal range of motion.  Cardiovascular: Normal rate, regular rhythm and normal heart sounds.   Pulmonary/Chest: Effort normal and breath sounds normal. No respiratory distress.  Abdominal: Soft. Bowel sounds are normal. She exhibits no distension. There is no tenderness.  Musculoskeletal:  Right ASIS to medial malleolus 83 cm  Left ASIS to medial malleolus 82 cm  No evidence of pelvic obliquity with standing  Patient with mild lurch on the left  Neurological: She is alert and oriented to person, place, and time.  Reflex Scores:      Patellar reflexes are 1+ on the right side and 1+ on the left side.      Achilles reflexes are 0 on the right side and 0 on the left side. Motor strength is 5/5 in the right deltoid, biceps, triceps, grip, 4+/5 in  left deltoid, biceps, triceps and grip 5/5 bilateral hip flexor and extensor and flexor   Psychiatric: She has a normal mood and affect. Her behavior is normal. Judgment and thought content normal.  Nursing note and vitals reviewed.         Assessment & Plan:  1. Right corona radiata infarct likely small vessel disease. Will follow-up with neurology as well as cardiology given that possible arrhythmia has not been yet excluded. Loop recorder placed. Continue Plavix for now  She has made excellent recovery from her stroke deficit still has some mild weakness in the left upper extremity. This is not affecting her functionally however. She is back to all her normal functional levels although a little bit slower. Do not see any indication for outpatient therapy at current time.   Also I do not see any stroke deficits which would interfere with her ability to drive at the current time. She was driving in her state distances up until the stroke. We discussed return to driving with patient and her daughter-in-law as below  Graduated return to driving instructions were provided. It is recommended that  the patient first drives with another licensed driver in an empty parking lot. If the patient does well with this, and they can drive on a quiet street with the licensed driver. If the patient does well with this they can drive on a busy street with a licensed driver. If the patient does well with this, the next time out they can go by himself. For the first month after resuming driving, I recommend no nighttime or Interstate driving.   2.  Right cervical myofascial pain, affecting trapezius, levator scapula musculature.Instructed in Thera-Band exercises in instructed trapezius stretching exercise Recommend counter irritant cream or patch Heat 20-30 minutes several times a day  If no Improvement after several weeks call for trigger point injections and possible therapy follow-up  Discussed recommendations with patient as well as daughter-in-law.

## 2015-12-06 ENCOUNTER — Telehealth: Payer: Self-pay | Admitting: Pulmonary Disease

## 2015-12-06 NOTE — Telephone Encounter (Signed)
Spoke with Carmen Duncan, she is checking on status of appeal for [name]. Made her aware of below [place] at 12/05/2015 5:06 PM     Status: Signed       Expand All Collapse All   Letter faxed to [facility] with copy of CT scan. Will contact [employer] for follow up. Hold in box, I will close encounter once completed       Will forward to michelle to f/u on appeal once received.

## 2015-12-07 ENCOUNTER — Telehealth: Payer: Self-pay | Admitting: Pulmonary Disease

## 2015-12-07 NOTE — Telephone Encounter (Signed)
Called [employer] and was advised appeal still in process

## 2015-12-07 NOTE — Telephone Encounter (Signed)
Called and spoke to patient's daughter in law and advised her to tell patient that we are still working on [facility] and will call her as soon as we find out the results of the appeal. Nothing further needed.

## 2015-12-07 NOTE — Telephone Encounter (Signed)
Carmen Duncan with [employer] calling regarding [name], CB is [phone].

## 2015-12-07 NOTE — Telephone Encounter (Signed)
Spoke with [employer] @ [place]. Answered several clinical questions from pt's chart notes. She will contact office once decision has been made. Nothing further needed at this time.

## 2015-12-08 ENCOUNTER — Ambulatory Visit (INDEPENDENT_AMBULATORY_CARE_PROVIDER_SITE_OTHER): Payer: Medicare Other

## 2015-12-08 ENCOUNTER — Encounter: Payer: Self-pay | Admitting: Cardiology

## 2015-12-08 ENCOUNTER — Telehealth: Payer: Self-pay | Admitting: Pulmonary Disease

## 2015-12-08 ENCOUNTER — Telehealth: Payer: Self-pay | Admitting: Family Medicine

## 2015-12-08 VITALS — BP 140/100 | HR 64 | Ht 61.0 in | Wt 127.2 lb

## 2015-12-08 DIAGNOSIS — Z0000 Encounter for general adult medical examination without abnormal findings: Secondary | ICD-10-CM | POA: Diagnosis not present

## 2015-12-08 DIAGNOSIS — Z780 Asymptomatic menopausal state: Secondary | ICD-10-CM | POA: Diagnosis not present

## 2015-12-08 NOTE — Patient Instructions (Addendum)
Carmen Duncan , Thank you for taking time to come for your Medicare Wellness Visit. I appreciate your ongoing commitment to your health goals. Please review the following plan we discussed and let me know if I can assist you in the future.   Plan a trip to the [facility]!  Can have DEXA at facility or your choice    (BP elevated; to rest today and has apt with Dr. Elease Hashimoto; on Monday; to of course go to ER if symptomatic)   These are the goals we discussed: Goals    . patient     Will consider setting goals every week; do something to get out; ? Exercise; social gathering with friends; [place];        This is a list of the screening recommended for you and due dates:  Health Maintenance  Topic Date Due  . DEXA scan (bone density measurement)  07/12/2003  . Flu Shot  12/26/2015  . Tetanus Vaccine  10/01/2020  . Shingles Vaccine  Completed  . Pneumonia vaccines  Completed     Fat and Cholesterol Restricted Diet Getting too much fat and cholesterol in your diet may cause health problems. Following this diet helps keep your fat and cholesterol at normal levels. This can keep you from getting sick. WHAT TYPES OF FAT SHOULD I CHOOSE?  Choose monosaturated and polyunsaturated fats. These are found in foods such as olive oil, canola oil, flaxseeds, walnuts, almonds, and seeds.  Eat more omega-3 fats. Good choices include salmon, mackerel, sardines, tuna, flaxseed oil, and ground flaxseeds.  Limit saturated fats. These are in animal products such as meats, butter, and cream. They can also be in plant products such as palm oil, palm kernel oil, and coconut oil.   Avoid foods with partially hydrogenated oils in them. These contain trans fats. Examples of foods that have trans fats are stick margarine, some tub margarines, cookies, crackers, and other baked goods. WHAT GENERAL GUIDELINES DO I NEED TO FOLLOW?   Check food labels. Look for the words "trans fat" and "saturated fat."  When  preparing a meal:  Fill half of your plate with vegetables and green salads.  Fill one fourth of your plate with whole grains. Look for the word "whole" as the first word in the ingredient list.  Fill one fourth of your plate with lean protein foods.  Limit fruit to two servings a day. Choose fruit instead of juice.  Eat more foods with soluble fiber. Examples of foods with this type of fiber are apples, broccoli, carrots, beans, peas, and barley. Try to get 20-30 g (grams) of fiber per day.  Eat more home-cooked foods. Eat less at restaurants and buffets.  Limit or avoid alcohol.  Limit foods high in starch and sugar.  Limit fried foods.  Cook foods without frying them. Baking, boiling, grilling, and broiling are all great options.  Lose weight if you are overweight. Losing even a small amount of weight can help your overall health. It can also help prevent diseases such as diabetes and heart disease. WHAT FOODS CAN I EAT? Grains Whole grains, such as whole wheat or whole grain breads, crackers, cereals, and pasta. Unsweetened oatmeal, bulgur, barley, quinoa, or brown rice. Corn or whole wheat flour tortillas. Vegetables Fresh or frozen vegetables (raw, steamed, roasted, or grilled). Green salads. Fruits All fresh, canned (in natural juice), or frozen fruits. Meat and Other Protein Products Ground beef (85% or leaner), grass-fed beef, or beef trimmed of fat. Skinless chicken  or [place]. Ground chicken or [place]. Pork trimmed of fat. All fish and seafood. Eggs. Dried beans, peas, or lentils. Unsalted nuts or seeds. Unsalted canned or dry beans. Dairy Low-fat dairy products, such as skim or 1% milk, 2% or reduced-fat cheeses, low-fat ricotta or cottage cheese, or plain low-fat yogurt. Fats and Oils Tub margarines without trans fats. Light or reduced-fat mayonnaise and salad dressings. Avocado. Olive, canola, sesame, or safflower oils. Natural peanut or almond butter (choose ones  without added sugar and oil). The items listed above may not be a complete list of recommended foods or beverages. Contact your dietitian for more options. WHAT FOODS ARE NOT RECOMMENDED? Grains White bread. White pasta. White rice. Cornbread. Bagels, pastries, and croissants. Crackers that contain trans fat. Vegetables White potatoes. Corn. Creamed or fried vegetables. Vegetables in a cheese sauce. Fruits Dried fruits. Canned fruit in light or heavy syrup. Fruit juice. Meat and Other Protein Products Fatty cuts of meat. Ribs, chicken wings, bacon, sausage, bologna, salami, chitterlings, fatback, hot dogs, bratwurst, and packaged luncheon meats. Liver and organ meats. Dairy Whole or 2% milk, cream, half-and-half, and cream cheese. Whole milk cheeses. Whole-fat or sweetened yogurt. Full-fat cheeses. Nondairy creamers and whipped toppings. Processed cheese, cheese spreads, or cheese curds. Sweets and Desserts Corn syrup, sugars, honey, and molasses. Candy. Jam and jelly. Syrup. Sweetened cereals. Cookies, pies, cakes, donuts, muffins, and ice cream. Fats and Oils Butter, stick margarine, lard, shortening, ghee, or bacon fat. Coconut, palm kernel, or palm oils. Beverages Alcohol. Sweetened drinks (such as sodas, lemonade, and fruit drinks or punches). The items listed above may not be a complete list of foods and beverages to avoid. Contact your dietitian for more information.   This information is not intended to replace advice given to you by your health care provider. Make sure you discuss any questions you have with your health care provider.   Document Released: 11/12/2011 Document Revised: 06/03/2014 Document Reviewed: 08/12/2013 Elsevier Interactive Patient Education 2016 [place] in the Home  Falls can cause injuries. They can happen to people of all ages. There are many things you can do to make your home safe and to help prevent falls.  WHAT CAN I DO ON THE  OUTSIDE OF MY HOME?  Regularly fix the edges of walkways and driveways and fix any cracks.  Remove anything that might make you trip as you walk through a door, such as a raised step or threshold.  Trim any bushes or trees on the path to your home.  Use bright outdoor lighting.  Clear any walking paths of anything that might make someone trip, such as rocks or tools.  Regularly check to see if handrails are loose or broken. Make sure that both sides of any steps have handrails.  Any raised decks and porches should have guardrails on the edges.  Have any leaves, snow, or ice cleared regularly.  Use sand or salt on walking paths during winter.  Clean up any spills in your garage right away. This includes oil or grease spills. WHAT CAN I DO IN THE BATHROOM?   Use night lights.  Install grab bars by the toilet and in the tub and shower. Do not use towel bars as grab bars.  Use non-skid mats or decals in the tub or shower.  If you need to sit down in the shower, use a plastic, non-slip stool.  Keep the floor dry. Clean up any water that spills on the floor as soon  as it happens.  Remove soap buildup in the tub or shower regularly.  Attach bath mats securely with double-sided non-slip rug tape.  Do not have throw rugs and other things on the floor that can make you trip. WHAT CAN I DO IN THE BEDROOM?  Use night lights.  Make sure that you have a light by your bed that is easy to reach.  Do not use any sheets or blankets that are too big for your bed. They should not hang down onto the floor.  Have a firm chair that has side arms. You can use this for support while you get dressed.  Do not have throw rugs and other things on the floor that can make you trip. WHAT CAN I DO IN THE KITCHEN?  Clean up any spills right away.  Avoid walking on wet floors.  Keep items that you use a lot in easy-to-reach places.  If you need to reach something above you, use a strong step  stool that has a grab bar.  Keep electrical cords out of the way.  Do not use floor polish or wax that makes floors slippery. If you must use wax, use non-skid floor wax.  Do not have throw rugs and other things on the floor that can make you trip. WHAT CAN I DO WITH MY STAIRS?  Do not leave any items on the stairs.  Make sure that there are handrails on both sides of the stairs and use them. Fix handrails that are broken or loose. Make sure that handrails are as long as the stairways.  Check any carpeting to make sure that it is firmly attached to the stairs. Fix any carpet that is loose or worn.  Avoid having throw rugs at the top or bottom of the stairs. If you do have throw rugs, attach them to the floor with carpet tape.  Make sure that you have a light switch at the top of the stairs and the bottom of the stairs. If you do not have them, ask someone to add them for you. WHAT ELSE CAN I DO TO HELP PREVENT FALLS?  Wear shoes that:  Do not have high heels.  Have rubber bottoms.  Are comfortable and fit you well.  Are closed at the toe. Do not wear sandals.  If you use a stepladder:  Make sure that it is fully opened. Do not climb a closed stepladder.  Make sure that both sides of the stepladder are locked into place.  Ask someone to hold it for you, if possible.  Clearly mark and make sure that you can see:  Any grab bars or handrails.  First and last steps.  Where the edge of each step is.  Use tools that help you move around (mobility aids) if they are needed. These include:  Canes.  Walkers.  Scooters.  Crutches.  Turn on the lights when you go into a dark area. Replace any light bulbs as soon as they burn out.  Set up your furniture so you have a clear path. Avoid moving your furniture around.  If any of your floors are uneven, fix them.  If there are any pets around you, be aware of where they are.  Review your medicines with your doctor. Some  medicines can make you feel dizzy. This can increase your chance of falling. Ask your doctor what other things that you can do to help prevent falls.   This information is not intended to replace advice  given to you by your health care provider. Make sure you discuss any questions you have with your health care provider.   Document Released: 03/09/2009 Document Revised: 09/27/2014 Document Reviewed: 06/17/2014 Elsevier Interactive Patient Education 2016 [place] Maintenance, Female Adopting a healthy lifestyle and getting preventive care can go a long way to promote health and wellness. Talk with your health care provider about what schedule of regular examinations is right for you. This is a good chance for you to check in with your provider about disease prevention and staying healthy. In between checkups, there are plenty of things you can do on your own. Experts have done a lot of research about which lifestyle changes and preventive measures are most likely to keep you healthy. Ask your health care provider for more information. WEIGHT AND DIET  Eat a healthy diet  Be sure to include plenty of vegetables, fruits, low-fat dairy products, and lean protein.  Do not eat a lot of foods high in solid fats, added sugars, or salt.  Get regular exercise. This is one of the most important things you can do for your health.  Most adults should exercise for at least 150 minutes each week. The exercise should increase your heart rate and make you sweat (moderate-intensity exercise).  Most adults should also do strengthening exercises at least twice a week. This is in addition to the moderate-intensity exercise.  Maintain a healthy weight  Body mass index (BMI) is a measurement that can be used to identify possible weight problems. It estimates body fat based on height and weight. Your health care provider can help determine your BMI and help you achieve or maintain a healthy  weight.  For females 48 years of age and older:   A BMI below 18.5 is considered underweight.  A BMI of 18.5 to 24.9 is normal.  A BMI of 25 to 29.9 is considered overweight.  A BMI of 30 and above is considered obese.  Watch levels of cholesterol and blood lipids  You should start having your blood tested for lipids and cholesterol at 77 years of age, then have this test every 5 years.  You may need to have your cholesterol levels checked more often if:  Your lipid or cholesterol levels are high.  You are older than 77 years of age.  You are at high risk for heart disease.  CANCER SCREENING   Lung Cancer  Lung cancer screening is recommended for adults 58-44 years old who are at high risk for lung cancer because of a history of smoking.  A yearly low-dose CT scan of the lungs is recommended for people who:  Currently smoke.  Have quit within the past 15 years.  Have at least a 30-pack-year history of smoking. A pack year is smoking an average of one pack of cigarettes a day for 1 year.  Yearly screening should continue until it has been 15 years since you quit.  Yearly screening should stop if you develop a health problem that would prevent you from having lung cancer treatment.  Breast Cancer  Practice breast self-awareness. This means understanding how your breasts normally appear and feel.  It also means doing regular breast self-exams. Let your health care provider know about any changes, no matter how small.  If you are in your 20s or 30s, you should have a clinical breast exam (CBE) by a health care provider every 1-3 years as part of a regular health exam.  If you  are 38 or older, have a CBE every year. Also consider having a breast X-ray (mammogram) every year.  If you have a family history of breast cancer, talk to your health care provider about genetic screening.  If you are at high risk for breast cancer, talk to your health care provider about  having an MRI and a mammogram every year.  Breast cancer gene (BRCA) assessment is recommended for women who have family members with BRCA-related cancers. BRCA-related cancers include:  Breast.  Ovarian.  Tubal.  Peritoneal cancers.  Results of the assessment will determine the need for genetic counseling and BRCA1 and BRCA2 testing. Cervical Cancer Your health care provider may recommend that you be screened regularly for cancer of the pelvic organs (ovaries, uterus, and vagina). This screening involves a pelvic examination, including checking for microscopic changes to the surface of your cervix (Pap test). You may be encouraged to have this screening done every 3 years, beginning at age 52.  For women ages 28-65, health care providers may recommend pelvic exams and Pap testing every 3 years, or they may recommend the Pap and pelvic exam, combined with testing for human papilloma virus (HPV), every 5 years. Some types of HPV increase your risk of cervical cancer. Testing for HPV may also be done on women of any age with unclear Pap test results.  Other health care providers may not recommend any screening for nonpregnant women who are considered low risk for pelvic cancer and who do not have symptoms. Ask your health care provider if a screening pelvic exam is right for you.  If you have had past treatment for cervical cancer or a condition that could lead to cancer, you need Pap tests and screening for cancer for at least 20 years after your treatment. If Pap tests have been discontinued, your risk factors (such as having a new sexual partner) need to be reassessed to determine if screening should resume. Some women have medical problems that increase the chance of getting cervical cancer. In these cases, your health care provider may recommend more frequent screening and Pap tests. Colorectal Cancer  This type of cancer can be detected and often prevented.  Routine colorectal cancer  screening usually begins at 77 years of age and continues through 77 years of age.  Your health care provider may recommend screening at an earlier age if you have risk factors for colon cancer.  Your health care provider may also recommend using home test kits to check for hidden blood in the stool.  A small camera at the end of a tube can be used to examine your colon directly (sigmoidoscopy or colonoscopy). This is done to check for the earliest forms of colorectal cancer.  Routine screening usually begins at age 88.  Direct examination of the colon should be repeated every 5-10 years through 77 years of age. However, you may need to be screened more often if early forms of precancerous polyps or small growths are found. Skin Cancer  Check your skin from head to toe regularly.  Tell your health care provider about any new moles or changes in moles, especially if there is a change in a mole's shape or color.  Also tell your health care provider if you have a mole that is larger than the size of a pencil eraser.  Always use sunscreen. Apply sunscreen liberally and repeatedly throughout the day.  Protect yourself by wearing long sleeves, pants, a wide-brimmed hat, and sunglasses whenever you are outside.  HEART DISEASE, DIABETES, AND HIGH BLOOD PRESSURE   High blood pressure causes heart disease and increases the risk of stroke. High blood pressure is more likely to develop in:  People who have blood pressure in the high end of the normal range (130-139/85-89 mm Hg).  People who are overweight or obese.  People who are African American.  If you are 29-54 years of age, have your blood pressure checked every 3-5 years. If you are 86 years of age or older, have your blood pressure checked every year. You should have your blood pressure measured twice--once when you are at a hospital or clinic, and once when you are not at a hospital or clinic. Record the average of the two measurements.  To check your blood pressure when you are not at a hospital or clinic, you can use:  An automated blood pressure machine at a pharmacy.  A home blood pressure monitor.  If you are between 83 years and 49 years old, ask your health care provider if you should take aspirin to prevent strokes.  Have regular diabetes screenings. This involves taking a blood sample to check your fasting blood sugar level.  If you are at a normal weight and have a low risk for diabetes, have this test once every three years after 77 years of age.  If you are overweight and have a high risk for diabetes, consider being tested at a younger age or more often. PREVENTING INFECTION  Hepatitis B  If you have a higher risk for hepatitis B, you should be screened for this virus. You are considered at high risk for hepatitis B if:  You were born in a country where hepatitis B is common. Ask your health care provider which countries are considered high risk.  Your parents were born in a high-risk country, and you have not been immunized against hepatitis B (hepatitis B vaccine).  You have HIV or AIDS.  You use needles to inject street drugs.  You live with someone who has hepatitis B.  You have had sex with someone who has hepatitis B.  You get hemodialysis treatment.  You take certain medicines for conditions, including cancer, organ transplantation, and autoimmune conditions. Hepatitis C  Blood testing is recommended for:  Everyone born from [birth year] through 1967.  Anyone with known risk factors for hepatitis C. Sexually transmitted infections (STIs)  You should be screened for sexually transmitted infections (STIs) including gonorrhea and chlamydia if:  You are sexually active and are younger than 77 years of age.  You are older than 77 years of age and your health care provider tells you that you are at risk for this type of infection.  Your sexual activity has changed since you were last screened and  you are at an increased risk for chlamydia or gonorrhea. Ask your health care provider if you are at risk.  If you do not have HIV, but are at risk, it may be recommended that you take a prescription medicine daily to prevent HIV infection. This is called pre-exposure prophylaxis (PrEP). You are considered at risk if:  You are sexually active and do not regularly use condoms or know the HIV status of your partner(s).  You take drugs by injection.  You are sexually active with a partner who has HIV. Talk with your health care provider about whether you are at high risk of being infected with HIV. If you choose to begin PrEP, you should first be tested for HIV. You  should then be tested every 3 months for as long as you are taking PrEP.  PREGNANCY   If you are premenopausal and you may become pregnant, ask your health care provider about preconception counseling.  If you may become pregnant, take 400 to 800 micrograms (mcg) of folic acid every day.  If you want to prevent pregnancy, talk to your health care provider about birth control (contraception). OSTEOPOROSIS AND MENOPAUSE   Osteoporosis is a disease in which the bones lose minerals and strength with aging. This can result in serious bone fractures. Your risk for osteoporosis can be identified using a bone density scan.  If you are 81 years of age or older, or if you are at risk for osteoporosis and fractures, ask your health care provider if you should be screened.  Ask your health care provider whether you should take a calcium or vitamin D supplement to lower your risk for osteoporosis.  Menopause may have certain physical symptoms and risks.  Hormone replacement therapy may reduce some of these symptoms and risks. Talk to your health care provider about whether hormone replacement therapy is right for you.  HOME CARE INSTRUCTIONS   Schedule regular health, dental, and eye exams.  Stay current with your immunizations.   Do  not use any tobacco products including cigarettes, chewing tobacco, or electronic cigarettes.  If you are pregnant, do not drink alcohol.  If you are breastfeeding, limit how much and how often you drink alcohol.  Limit alcohol intake to no more than 1 drink per day for nonpregnant women. One drink equals 12 ounces of beer, 5 ounces of wine, or 1 ounces of hard liquor.  Do not use street drugs.  Do not share needles.  Ask your health care provider for help if you need support or information about quitting drugs.  Tell your health care provider if you often feel depressed.  Tell your health care provider if you have ever been abused or do not feel safe at home.   This information is not intended to replace advice given to you by your health care provider. Make sure you discuss any questions you have with your health care provider.   Document Released: 11/26/2010 Document Revised: 06/03/2014 Document Reviewed: 04/14/2013 Elsevier Interactive Patient Education [employer].

## 2015-12-08 NOTE — Telephone Encounter (Signed)
Rec'd a call back form Butch [employer];  The patient gave permission for HIPAA regarding [employer]ch Penny and the release of medical information;  Verbal consent; face to face good x 30 days until she sign;   Expressed concern that Ms. Age was not being honest when  asking questions about depression; Also states many of the [employer] screening questions I ask were in fact true, even though denied by her;  Lack of interest, watching TV; staying in her room all day long Her friends are in [place]; She travels to them; This is disappointing to her;  Is isolating;  Major anxiety when just driving to [employer]; They are very close [employer];  She likes to be taken care of.   I had discussed exercise; as well as oncologist encouraged exercise  but she is not following through;  There are senior groups at church; she has only been to 2;   Today she was well dressed; takes pride in Nurse, mental health; Today she walked outside to get a newspaper in her nightgown and this is not like her She ate cube steak with her hands /  she states she does not have strength in her hands Her nail professional has noticed changes; Recently got out of someone else's car and left her things in the car.    Recently when to [facility] and was gone 4 hours.   Dr Elease Hashimoto will see Monday; Will evaluate meds and memory  Will come with her mother in  [place] on Monday;   Thanked her for her call;     The dtr will come to apt on Monday;   Dr. Elease Hashimoto for [facility]

## 2015-12-08 NOTE — Telephone Encounter (Signed)
Daughter in law would like you to call her concerning pt. However Mrs Carmen Duncan is not on the [place], but her son is.  Mrs Carmen Duncan will fill one out Monday when they come to see Dr Elease Hashimoto.

## 2015-12-08 NOTE — Telephone Encounter (Signed)
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

## 2015-12-08 NOTE — Progress Notes (Addendum)
Subjective:   Carmen Duncan is a 75 y.o. female who presents for Medicare Annual (Subsequent) preventive examination.  Review of Systems:   HRA assessment completed during this visit with Ms Gabhart  The Patient was informed  the wellness visit is to identify future health risk and educate and initiate measures that can reduce risk for increased disease through the lifespan.    NO ROS; Medicare Wellness Visit Last OV:  05/2015/ taking B12; stopped Topamax, tremors/  Labs completed: 10/2015    chol 132; Trig 166; HDL 35 and LDL 64 (A1c 5.4 ) medically managed; Discussed some forms of exercise  New issues  Medical Crytogenic Stroke; states the night before she went down in the bathroom; was able to call family; family reported she came down the stairs sitting; Was oriented but noted some facial droop.  The patient cannot state why she fell   Started on [name] for pul fibrosis / does not think she is having SE reviewed with the family. States she has been on the med for 2 months;  Recent DCIS; monitoring; radiation completed   C/o of fatigue; Dtr concerned with weight loss over time. Appears stable at 127;   Lifestyle review and risk: (mother had Hyperlipidemia; Htn; father had Hyperlipidemia; HD; Stroke  Psychosocial: Lives with [name] and Butch Penny and the children x 9.5years Going out to eat with friends; girlfriends for social but admits to not going very often   Tobacco: never smoked   How many drinks do you have per week? Glass of wine    Medications: reviewed meds  States she is tolerating Lipitor well Taking librax or some time; Educated that it is not recommended for older adults and why but takes once a day and tol well thus far.  States she has had no SE with [name]; Is seeing pulmonary in August; Pulmonary MD does not know she had a ? Stroke and recommended she let them know.   BMI: 24  Diet;   Am; bowl of cereal; egg occasional; coffee Lunch burger; banana sandwich, soup;   Supper; meat and 2 vegetables;  Nutritional counseling;  Request she weigh x 2 every week   Teeth or Denture issues? Yes; tooth replaced due "rotten' under the bone; ? Etiology  See dentist regularly  Exercise; strengthening exercises  Normal day; gets up when she wakes up 8 and 10am Does Household things Some days when she feels weak Going up and down stairs; 5 or 6 times back and forth   Fairly Sedentary (had recent radiation) Agreed to add some social outings to her calendar. Sr. events center on [address];  May try to go out with friends more   HOME SAFETY; lives with family in room upstairs;  Walk in shower;  ADL's  Fall hx;  Fall risk: Fear of falling? Fell in the bathroom Legs were under body and was alert and oriented; ? facial droop Also was sitting on stairs to come down  Did ST, OT, PT;   Given education on "Fall Prevention in the Home" for more safety tips the patient can apply as appropriate.  Long term goal is to "age in place" or undecided   Safety features reviewed for safe community; firearms if in the home; smoke alarms; sun protection when outside; driving difficulties or accidents/ no accidents  Mental Health:  Any emotional problems? Anxious, depressed, irritable, sad or blue? no Denies feeling depressed or hopeless; voices pleasure in daily life How many social activities  have you been engaged in within the last 2 weeks?none Admits that the Breast Cancer was a "shock" and has been slowly adjusting; Now stroke but no residual; encouraged her to continue on, exercise and to stay engaged; Make plans; Likes [employer].   Pain: nothing except the crick in neck; followed with neuro  Cognitive;  MMSE completed;  Serial 7s from 100 4/5; recall 2/3; pictures 0 Otherwise; appears tired; A and O; looks to dtr to answer questions; States family life is good; no add'l stresses;    Any dizziness when standing up? No   Mobilization and Functional  losses from last year to this year? 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 next year   Immunizations Due: (Vaccines reviewed and educated regarding any overdue)   Individual Goal: Stay busy and engage in new social activities; exercise   Health Recommendations and Referrals Risk for CV disease reviewed; including BP; reviewed; BP diastolic elevated 0000000; 2nd reading was 140/100 stated she felt tired; but no other issues;  Oxygen sat good;  States she is tired for going to the doctor; will rest today;   Cholesterol; given information on foods high in chol  Nutritional Counseling; educated to check weight 2 times a week Osteopenia; to have Dexa at [facility] or facility of choice. Has not had one in many years.  Barriers to Success Overwhelmed with health issues this year; shocked with breast cancer dx.   Current Care Team reviewed and updated Dr. Jana Hakim;  Dr. Elsworth Soho  Dr. Rayann Heman  All in my chart    Education provided and lifestyle risk discussed   All Health Maintenance Gaps Reviewed for closure        Objective:     Vitals: BP 140/100 mmHg  Pulse 64  Ht 5\' 1"  (1.549 m)  Wt 127 lb 3 oz (57.692 kg)  BMI 24.04 kg/m2  SpO2 95%  Body mass index is 24.04 kg/(m^2).   Tobacco History  Smoking status  . Never Smoker   Smokeless tobacco  . Never Used     Counseling given: Yes   Past  Medical History  Diagnosis Date  . Arthritis   . Asthma   . Hypertension   . GERD (gastroesophageal reflux disease)   . Hyperlipidemia   . Diverticulosis   . IBS (irritable bowel syndrome)   . HOH (hard of hearing) left ear  . Acoustic neuroma ([place])   . Ejection fraction   . Breast cancer of upper-outer quadrant of right female breast ([facility]) 07/19/2015  . Brain cancer ([place])   . Anxiety   . Stroke [facility])     no deficits, on plavix  . Essential tremor     on inderal  . Pneumonia     04-2014, CAP  . Interstitial lung disease [facility])    Past Surgical History  Procedure Laterality Date  . Cholecystectomy  2011  . Abdominal hysterectomy  1980  . Tonsillectomy  1945  .  Biopsy breast    . Eyes  2007    cararacts  . Tee without cardioversion N/A 09/05/2014    Procedure: TRANSESOPHAGEAL ECHOCARDIOGRAM (TEE);  Surgeon: Lelon Perla, MD;  Location[facility] ENDOSCOPY;  Service: Cardiovascular;  Laterality: N/A;  . Breast lumpectomy with radioactive seed and sentinel lymph node biopsy Right 08/10/2015    Procedure: BREAST LUMPECTOMY WITH RADIOACTIVE SEED AND SENTINEL LYMPH NODE BIOPSY;  Surgeon: Autumn Messing III, MD;  Location: [place];  Service: General;  Laterality: Right;  . Ep implantable device N/A 11/16/2015    Procedure: Loop Recorder Insertion;  Surgeon: [name], MD;  Location: [place] CV LAB;  Service: Cardiovascular;  Laterality: N/A;  . Tee without cardioversion N/A 11/16/2015    Procedure: TRANSESOPHAGEAL ECHOCARDIOGRAM (TEE);  Surgeon: Larey Dresser, MD;  Location: [facility] ENDOSCOPY;  Service: Cardiovascular;  Laterality: N/A;   Family History  Problem Relation Age of Onset  . Hyperlipidemia Mother   . Hypertension Mother   . Hyperlipidemia Father   . Heart disease Father 60  . Stroke Father   . Lung cancer Paternal Uncle     x 4   History  Sexual Activity  . Sexual Activity:  . Partners: Female    Outpatient Encounter Prescriptions as of 12/08/2015    Medication Sig  . acetaminophen (TYLENOL) 325 MG tablet Take 650 mg by mouth every 6 (six) hours as needed for headache.  [name] atorvastatin (LIPITOR) 20 MG tablet Take 1 tablet (20 mg total) by mouth daily.  . clidinium-chlordiazePOXIDE (LIBRAX) 5-2.5 MG capsule Take 1 capsule by mouth 2 (two) times daily as needed.  . clopidogrel (PLAVIX) 75 MG tablet Take 1 tablet (75 mg total) by mouth daily.  . Nintedanib ([name]) 100 MG CAPS Take 100 mg by mouth 2 (two) times daily.  . pantoprazole (PROTONIX) 40 MG tablet Take 1 tablet (40 mg total) by mouth daily.  . propranolol (INDERAL) 20 MG tablet Take 1 tablet (20 mg total) by mouth daily.   No facility-administered encounter medications on file as of 12/08/2015.    Activities of Daily Living In your present state of health, do you have any difficulty performing the following activities: 12/08/2015 11/16/2015  Hearing? Tempie Donning  Vision? N N  Difficulty concentrating or making decisions? N Y  Walking or climbing stairs? N Y  Dressing or bathing? N Y  Conservation officer, nature and eating ? N -  Using the Toilet? N -  In the past six months, have you accidently leaked urine? N -  Do you have problems with loss of bowel control? N -  Managing your Medications? N -  Managing your Finances? N -  Housekeeping or managing your Housekeeping? N -    Patient Care Team: [name], MD as PCP - General Chauncey Cruel, MD as Consulting Physician (Oncology) Autumn Messing III, MD as Consulting Physician (General Surgery) Kyung Rudd, MD as Consulting Physician (Radiation Oncology) Rigoberto Noel, MD as Consulting Physician (Pulmonary Disease)    Assessment:     Exercise Activities and Dietary recommendations    Goals    . patient     Will consider setting goals every week; do something to get out; ? Exercise; social gathering with friends; Beach;       Fall Risk Fall Risk  12/08/2015 12/05/2015 08/28/2015 02/01/2015 01/04/2015  Falls in the past year? Yes No No  No No  Number falls in past yr: 1 - - - -  Follow  up Education provided - - - -   Depression Screen PHQ 2/9 Scores 12/08/2015 12/06/2015 02/01/2015 07/23/2013  PHQ - 2 Score 0 2 0 0  PHQ- 9 Score - 5 - -    Denies depression but is tired of medical issues and care;  Cognitive Testing MMSE - Mini Mental State Exam 12/08/2015 06/08/2015  Orientation to time 5 4  Orientation to Place 5 5  Registration 3 3  Attention/ Calculation 4 5  Recall 2 3  Language- name 2 objects 2 2  Language- repeat 1 1  Language- follow 3 step command 3 3  Language- read & follow direction 1 1  Write a sentence 1 1  Copy design 0 0  Copy design-comments picture close;  -  Total score 27 28    Immunization History  Administered Date(s) Administered  . Influenza Split 04/04/2011  . Influenza Whole 03/27/2010  . Influenza, High Dose Seasonal PF 03/14/2015  . Influenza,inj,Quad PF,36+ Mos 03/08/2013, 01/20/2014  . Pneumococcal Conjugate-13 12/27/2014  . Pneumococcal Polysaccharide-23 07/05/2011  . Tdap 10/02/2010  . Zoster 10/02/2010   Screening Tests Health Maintenance  Topic Date Due  . DEXA SCAN  07/12/2003  . INFLUENZA VACCINE  12/26/2015  . TETANUS/TDAP  10/01/2020  . ZOSTAVAX  Completed  . PNA vac Low Risk Adult  Completed      Plan:   Diastolic BP was elevated; encouraged to rest when she got home; Has apt with Dr. Elease Hashimoto on Monday.  Reviewed med list;   During the course of the visit the patient was educated and counseled about the following appropriate screening and preventive services:   Vaccines to include Pneumoccal, Influenza, Hepatitis B, Td, Zostavax, HCV  Electrocardiogram  Cardiovascular Disease/ educated   Colorectal cancer screening/ aged out  Bone density screening ORDERED   Diabetes screening/neg  Glaucoma screening; eye exam spring; neg  Mammography/ recent hx breast cancer and tx  Nutrition counseling / will weight 2 times a week; Dtr in law concerned with  weight loss; the patient agreed to watch and was educated on the importance of eating nutrients and maintaining weight.   Patient Instructions (the written plan) was given to the patient.   [id number], RN  12/08/2015  I have reviewed and agree with note, evaluation, plan. I am thankful for follow up Monday given the history of stroke and elevated blood pressures- if persists, may need medication adjustment BP Readings from Last 3 Encounters:  12/08/15 140/100  12/05/15 118/69  11/27/15 129/52  From last few readings- hopeful will return to this range without intervention.   Garret Reddish, MD

## 2015-12-11 ENCOUNTER — Emergency Department (HOSPITAL_COMMUNITY): Payer: Medicare Other

## 2015-12-11 ENCOUNTER — Emergency Department (HOSPITAL_COMMUNITY)
Admission: EM | Admit: 2015-12-11 | Discharge: 2015-12-11 | Disposition: A | Discharge status: Home / Self Care | Payer: Medicare Other | Attending: Emergency Medicine | Admitting: Emergency Medicine

## 2015-12-11 ENCOUNTER — Ambulatory Visit: Payer: Medicare Other | Admitting: Family Medicine

## 2015-12-11 ENCOUNTER — Encounter (HOSPITAL_COMMUNITY): Payer: Self-pay

## 2015-12-11 DIAGNOSIS — Z8673 Personal history of transient ischemic attack (TIA), and cerebral infarction without residual deficits: Secondary | ICD-10-CM | POA: Insufficient documentation

## 2015-12-11 DIAGNOSIS — I10 Essential (primary) hypertension: Secondary | ICD-10-CM | POA: Insufficient documentation

## 2015-12-11 DIAGNOSIS — S01511A Laceration without foreign body of lip, initial encounter: Secondary | ICD-10-CM | POA: Diagnosis not present

## 2015-12-11 DIAGNOSIS — M542 Cervicalgia: Secondary | ICD-10-CM | POA: Diagnosis not present

## 2015-12-11 DIAGNOSIS — S8002XA Contusion of left knee, initial encounter: Secondary | ICD-10-CM | POA: Diagnosis not present

## 2015-12-11 DIAGNOSIS — S8001XA Contusion of right knee, initial encounter: Secondary | ICD-10-CM | POA: Insufficient documentation

## 2015-12-11 DIAGNOSIS — Y9289 Other specified places as the place of occurrence of the external cause: Secondary | ICD-10-CM | POA: Insufficient documentation

## 2015-12-11 DIAGNOSIS — S0993XA Unspecified injury of face, initial encounter: Secondary | ICD-10-CM | POA: Diagnosis present

## 2015-12-11 DIAGNOSIS — Y9301 Activity, walking, marching and hiking: Secondary | ICD-10-CM | POA: Insufficient documentation

## 2015-12-11 DIAGNOSIS — J45909 Unspecified asthma, uncomplicated: Secondary | ICD-10-CM | POA: Insufficient documentation

## 2015-12-11 DIAGNOSIS — T148XXA Other injury of unspecified body region, initial encounter: Secondary | ICD-10-CM

## 2015-12-11 DIAGNOSIS — Z91040 Latex allergy status: Secondary | ICD-10-CM | POA: Insufficient documentation

## 2015-12-11 DIAGNOSIS — S022XXA Fracture of nasal bones, initial encounter for closed fracture: Secondary | ICD-10-CM | POA: Diagnosis not present

## 2015-12-11 DIAGNOSIS — S0083XA Contusion of other part of head, initial encounter: Secondary | ICD-10-CM | POA: Diagnosis not present

## 2015-12-11 DIAGNOSIS — W19XXXA Unspecified fall, initial encounter: Secondary | ICD-10-CM

## 2015-12-11 DIAGNOSIS — Z79899 Other long term (current) drug therapy: Secondary | ICD-10-CM | POA: Insufficient documentation

## 2015-12-11 DIAGNOSIS — S0181XA Laceration without foreign body of other part of head, initial encounter: Secondary | ICD-10-CM | POA: Diagnosis not present

## 2015-12-11 DIAGNOSIS — D691 Qualitative platelet defects: Secondary | ICD-10-CM | POA: Diagnosis not present

## 2015-12-11 DIAGNOSIS — S0003XA Contusion of scalp, initial encounter: Secondary | ICD-10-CM | POA: Diagnosis not present

## 2015-12-11 DIAGNOSIS — W1839XA Other fall on same level, initial encounter: Secondary | ICD-10-CM | POA: Insufficient documentation

## 2015-12-11 DIAGNOSIS — S022XXB Fracture of nasal bones, initial encounter for open fracture: Secondary | ICD-10-CM

## 2015-12-11 DIAGNOSIS — S0121XA Laceration without foreign body of nose, initial encounter: Secondary | ICD-10-CM | POA: Diagnosis not present

## 2015-12-11 DIAGNOSIS — S199XXA Unspecified injury of neck, initial encounter: Secondary | ICD-10-CM | POA: Diagnosis not present

## 2015-12-11 DIAGNOSIS — Y999 Unspecified external cause status: Secondary | ICD-10-CM | POA: Insufficient documentation

## 2015-12-11 DIAGNOSIS — S0990XA Unspecified injury of head, initial encounter: Secondary | ICD-10-CM

## 2015-12-11 DIAGNOSIS — S098XXA Other specified injuries of head, initial encounter: Secondary | ICD-10-CM | POA: Diagnosis not present

## 2015-12-11 DIAGNOSIS — Z7902 Long term (current) use of antithrombotics/antiplatelets: Secondary | ICD-10-CM

## 2015-12-11 LAB — CBC WITH DIFFERENTIAL/PLATELET
BASOS ABS: 0 10*3/uL (ref 0.0–0.1)
BASOS PCT: 0 %
EOS ABS: 0.2 10*3/uL (ref 0.0–0.7)
Eosinophils Relative: 2 %
HEMATOCRIT: 44.2 % (ref 36.0–46.0)
HEMOGLOBIN: 14.7 g/dL (ref 12.0–15.0)
Lymphocytes Relative: 10 %
Lymphs Abs: 0.8 10*3/uL (ref 0.7–4.0)
MCH: 30.5 pg (ref 26.0–34.0)
MCHC: 33.3 g/dL (ref 30.0–36.0)
MCV: 91.7 fL (ref 78.0–100.0)
MONO ABS: 0.5 10*3/uL (ref 0.1–1.0)
MONOS PCT: 6 %
NEUTROS ABS: 6.5 10*3/uL (ref 1.7–7.7)
NEUTROS PCT: 82 %
Platelets: 212 10*3/uL (ref 150–400)
RBC: 4.82 MIL/uL (ref 3.87–5.11)
RDW: 14.5 % (ref 11.5–15.5)
WBC: 8 10*3/uL (ref 4.0–10.5)

## 2015-12-11 LAB — BASIC METABOLIC PANEL
ANION GAP: 5 (ref 5–15)
BUN: 6 mg/dL (ref 6–20)
CALCIUM: 9.2 mg/dL (ref 8.9–10.3)
CO2: 28 mmol/L (ref 22–32)
CREATININE: 0.62 mg/dL (ref 0.44–1.00)
Chloride: 107 mmol/L (ref 101–111)
Glucose, Bld: 94 mg/dL (ref 65–99)
Potassium: 3.9 mmol/L (ref 3.5–5.1)
Sodium: 140 mmol/L (ref 135–145)

## 2015-12-11 MED ORDER — LIDOCAINE HCL (PF) 1 % IJ SOLN
5.0000 mL | Freq: Once | INTRAMUSCULAR | Status: AC
  Administered 2015-12-11: 5 mL
  Filled 2015-12-11: qty 5

## 2015-12-11 MED ORDER — OXYCODONE-ACETAMINOPHEN 5-325 MG PO TABS: 2.0000 | ORAL_TABLET | ORAL | Status: DC | PRN

## 2015-12-11 MED ORDER — AMOXICILLIN-POT CLAVULANATE 875-125 MG PO TABS: 1.0000 | ORAL_TABLET | Freq: Two times a day (BID) | ORAL | Status: DC

## 2015-12-11 MED ORDER — OXYCODONE-ACETAMINOPHEN 5-325 MG PO TABS
1.0000 | ORAL_TABLET | Freq: Once | ORAL | Status: AC
  Administered 2015-12-11: 1 via ORAL
  Filled 2015-12-11: qty 1

## 2015-12-11 MED ORDER — AMOXICILLIN-POT CLAVULANATE 875-125 MG PO TABS
1.0000 | ORAL_TABLET | Freq: Once | ORAL | Status: AC
  Administered 2015-12-11: 1 via ORAL
  Filled 2015-12-11: qty 1

## 2015-12-11 MED ORDER — DOCUSATE SODIUM 100 MG PO CAPS: 100.0000 mg | ORAL_CAPSULE | Freq: Two times a day (BID) | ORAL | Status: DC

## 2015-12-11 MED ORDER — MORPHINE SULFATE (PF) 2 MG/ML IV SOLN
2.0000 mg | Freq: Once | INTRAVENOUS | Status: AC
  Administered 2015-12-11: 2 mg via INTRAVENOUS
  Filled 2015-12-11: qty 1

## 2015-12-11 NOTE — Telephone Encounter (Signed)
Received letter from [place] that [facility] has been approved. Copy of letter placed in scan folder to be scanned into patient's chart. Nothing further needed.

## 2015-12-11 NOTE — ED Notes (Signed)
Suture cart at bedside, awaiting provider

## 2015-12-11 NOTE — ED Notes (Signed)
Pt. Coming from home via [facility] for fall today. Pt. Had a mechanical fall in garage and landed on her face. Pt. Is on plavix. Pt. Has laceration to nose, epistaxis and abrasion to left knee. Pt. C/o headache at this time. Pt. Aox4. Pt. Old bruising to left breast from insertion of loop recorder 2 weeks ago. Pt. Right arm restricted from hx of breast cancer surgery.

## 2015-12-11 NOTE — Telephone Encounter (Signed)
Noted  

## 2015-12-11 NOTE — ED Provider Notes (Signed)
CSN: [id number]     Arrival date & time 12/11/15  1022 History   First MD Initiated Contact with Patient 12/11/15 1027     Chief Complaint  Patient presents with  . Fall    on plavix     (Consider location/radiation/quality/duration/timing/severity/associated sxs/prior Treatment) HPI Patient was walking back to her house after picking up her newspaper. Upon reaching her garage, she lost her balance and began falling forward. Patient poor she landed face first in her garage and also on her knees. She does not recall if she had loss of consciousness. She does report that she can't exactly remember hitting the ground and then immediately waking up. She reports she does have a bad headache. She has had bleeding from a nasal injury. Patient denies upper extremity pain or injury. She reports her knees are bruised but is not having any difficulty with moving her legs or pain in the hips or back. Patient does take daily Plavix. Past Medical History  Diagnosis Date  . Arthritis   . Asthma   . Hypertension   . GERD (gastroesophageal reflux disease)   . Hyperlipidemia   . Diverticulosis   . IBS (irritable bowel syndrome)   . HOH (hard of hearing) left ear  . Acoustic neuroma ([place])   . Ejection fraction   . Breast cancer of upper-outer quadrant of right female breast ([place]) 07/19/2015  . Brain cancer ([place])   . Anxiety   . Stroke [facility])     no deficits, on plavix  . Essential tremor     on inderal  . Pneumonia     04-2014, CAP  . Interstitial lung disease [facility])    Past Surgical History  Procedure Laterality Date  . Cholecystectomy  2011  . Abdominal hysterectomy  1980  . Tonsillectomy  1945  . Biopsy breast    . Eyes  2007    cararacts  . Tee without cardioversion N/A 09/05/2014    Procedure: TRANSESOPHAGEAL ECHOCARDIOGRAM (TEE);  Surgeon: Lelon Perla, MD;  Location: [facility] ENDOSCOPY;  Service: Cardiovascular;  Laterality: N/A;  . Breast lumpectomy with radioactive seed and sentinel  lymph node biopsy Right 08/10/2015    Procedure: BREAST LUMPECTOMY WITH RADIOACTIVE SEED AND SENTINEL LYMPH NODE BIOPSY;  Surgeon: Autumn Messing III, MD;  Location: [place];  Service: General;  Laterality: Right;  . Ep implantable device N/A 11/16/2015    Procedure: Loop Recorder Insertion;  Surgeon: [name], MD;  Location: [place] CV LAB;  Service: Cardiovascular;  Laterality: N/A;  . Tee without cardioversion N/A 11/16/2015    Procedure: TRANSESOPHAGEAL ECHOCARDIOGRAM (TEE);  Surgeon: Larey Dresser, MD;  Location: [facility] ENDOSCOPY;  Service: Cardiovascular;  Laterality: N/A;   Family History  Problem Relation Age of Onset  . Hyperlipidemia Mother   . Hypertension Mother   . Hyperlipidemia Father   . Heart disease Father 26  . Stroke Father   . Lung cancer Paternal Uncle     x 4   Social History  Substance Use Topics  . Smoking status: Never Smoker   . Smokeless tobacco: Never Used  . Alcohol Use: 0.0 oz/week    0 Standard drinks or equivalent per week     Comment: wine about 2 times a week    OB History    No data available     Review of Systems 10 Systems reviewed and are negative for acute change except as noted in the HPI.    Allergies  Latex; Sulfonamide  derivatives; and Tape  Home Medications   Prior to Admission medications   Medication Sig Start Date End Date Taking? Authorizing Provider  acetaminophen (TYLENOL) 325 MG tablet Take 325 mg by mouth every 6 (six) hours as needed for headache.    Yes Historical Provider, MD  atorvastatin (LIPITOR) 20 MG tablet Take 1 tablet (20 mg total) by mouth daily. 11/27/15  Yes Daniel J Angiulli, PA-C  clidinium-chlordiazePOXIDE (LIBRAX) 5-2.5 MG capsule Take 1 capsule by mouth 2 (two) times daily as needed. 11/03/15  Yes [name], MD  clopidogrel (PLAVIX) 75 MG tablet Take 1 tablet (75 mg total) by mouth daily. 11/27/15  Yes Daniel J Angiulli, PA-C  Nintedanib ([name]) 100 MG CAPS Take 100 mg by mouth 2  (two) times daily.   Yes Historical Provider, MD  pantoprazole (PROTONIX) 40 MG tablet Take 1 tablet (40 mg total) by mouth daily. 11/27/15  Yes Daniel J Angiulli, PA-C  propranolol (INDERAL) 20 MG tablet Take 1 tablet (20 mg total) by mouth daily. 11/27/15  Yes Daniel J Angiulli, PA-C  amoxicillin-clavulanate (AUGMENTIN) 875-125 MG tablet Take 1 tablet by mouth 2 (two) times daily. One po bid x 7 days 12/11/15   Charlesetta Shanks, MD  docusate sodium (COLACE) 100 MG capsule Take 1 capsule (100 mg total) by mouth every 12 (twelve) hours. 12/11/15   Charlesetta Shanks, MD  oxyCODONE-acetaminophen (PERCOCET) 5-325 MG tablet Take 2 tablets by mouth every 4 (four) hours as needed. 12/11/15   Charlesetta Shanks, MD   BP 188/83 mmHg  Pulse 96  Temp(Src) 98 F (36.7 C) (Oral)  Resp 20  Ht 5\' 1"  (1.549 m)  Wt 130 lb (58.968 kg)  BMI 24.58 kg/m2  SpO2 96% Physical Exam  Constitutional: She is oriented to person, place, and time.  Patient is alert and nontoxic. She does appear to be in moderate pain. Mental status is clear with GCS of 15. No respiratory distress.  HENT:  Patient has approximately 5 cm hematoma to the center forehead. No laceration overlying the hematoma. She does however have 1 cm laceration over the nasal bridge. It is horizontal oriented. Mild to moderate nasal bridge swelling. Some dried blood in the left nare. The patient has a linear, non-gaping laceration to the inner upper lip. This does not extend onto the vermilion border. Dentition are stable without pain or movement and palpation.  Eyes: EOM are normal. Pupils are equal, round, and reactive to light.  Neck: Neck supple.  Cardiovascular: Normal rate, regular rhythm, normal heart sounds and intact distal pulses.   Pulmonary/Chest: Effort normal and breath sounds normal. She exhibits no tenderness.  Patient poor she's been having some chronic right-sided chest pain since a lumpectomy. The site is clean and dry. It is well-healed. There are no  abrasions or contusions to the chest wall and no crepitus.  Abdominal: Soft. She exhibits no distension. There is no tenderness.  Musculoskeletal: Normal range of motion. She exhibits no edema or tenderness.  Or extremity have normal range of motion without evident injury. Both knees have approximately 3 cm contusions with superficial abrasion but no effusion. Normal range of motion at the lower extremities with flexion and extension at the hips knees and ankles.  Neurological: She is alert and oriented to person, place, and time. No cranial nerve deficit. She exhibits normal muscle tone. Coordination normal.  Skin: Skin is warm and dry.  Psychiatric: She has a normal mood and affect.    ED Course  .Marland KitchenLaceration Repair Date/Time:  12/11/2015 1:59 PM Performed by: Charlesetta Shanks Authorized by: Charlesetta Shanks Body area: head/neck Location details: nose Laceration length: 1 cm Foreign bodies: no foreign bodies Tendon involvement: none Nerve involvement: none Vascular damage: no Anesthesia: local infiltration Local anesthetic: lidocaine 1% without epinephrine Anesthetic total: 2 ml Preparation: Patient was prepped and draped in the usual sterile fashion. Irrigation solution: saline Amount of cleaning: standard Debridement: none Degree of undermining: none Skin closure: 5-0 Prolene Number of sutures: 3 Technique: simple Approximation: close Approximation difficulty: simple Dressing: antibiotic ointment   (including critical care time)  Labs Review Labs Reviewed  BASIC METABOLIC PANEL  CBC WITH DIFFERENTIAL/PLATELET    Imaging Review Ct Head Wo Contrast  12/11/2015  CLINICAL DATA:  Pain following fall.  History of breast carcinoma EXAM: CT HEAD WITHOUT CONTRAST CT MAXILLOFACIAL WITHOUT CONTRAST CT CERVICAL SPINE WITHOUT CONTRAST TECHNIQUE: Multidetector CT imaging of the head, cervical spine, and maxillofacial structures were performed using the standard protocol without  intravenous contrast. Multiplanar CT image reconstructions of the cervical spine and maxillofacial structures were also generated. COMPARISON:  Head CT and brain MRI November 14, 2015 FINDINGS: CT HEAD FINDINGS Mild diffuse atrophy is stable. There is no intracranial mass, hemorrhage, extra-axial fluid collection, or midline shift. There is small vessel disease throughout the centra semiovale bilaterally. The recently noted small lacunar infarct in the right centrum semiovale is not discernible from the surrounding small vessel disease in this region by CT. No acute infarct is demonstrable on this study. There are calcifications in each cavernous carotid artery. No hyperdense lesions are identified. There is a right frontal scalp hematoma. No calvarial fracture is evident. The mastoid air cells are clear. The visualized paranasal sinuses are clear. Visualized orbits appear symmetric and unremarkable bilaterally. CT MAXILLOFACIAL FINDINGS There is soft tissue injury over the nasal region. There is a fracture of the distal left nasal bone with alignment near anatomic. There is soft tissue swelling over the right frontal sinus. No other fractures are evident. No dislocations. No intraorbital lesions are evident. Orbits appear symmetric bilaterally. There is opacification of the left near ease due to edema. Paranasal sinuses are clear. Ostiomeatal unit complexes are patent bilaterally. 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 Salivary glands appear unremarkable. No adenopathy. Visualized pharynx appears unremarkable. CT CERVICAL SPINE FINDINGS There is no fracture or spondylolisthesis. Prevertebral soft tissues and predental space regions are normal. There is partial ankylosis of the C4-5 disc. There is moderately severe disc space narrowing at C5-6 and C6-7. There is slightly milder narrowing at C7-T1. There is multilevel facet hypertrophy. There  is exit foraminal narrowing due to bony hypertrophy at several levels, most marked at C3-4 on the left. There is scarring in the lung apices bilaterally. There are foci of carotid artery calcification bilaterally. IMPRESSION: CT head: Atrophy with extensive periventricular small vessel disease. No hemorrhage or mass. No acute infarct is evident on this study. There is a right frontal scalp hematoma. No acute fracture evident. CT maxillofacial: Slightly displaced fracture of the distal most aspect left nasal bone. Overlying soft tissue injury in the nasal region. Soft tissue swelling upper right face and over the right frontal sinus region without fracture underlying. Paranasal sinuses clear. Ostiomeatal unit complexes patent. Edema in the anterior nasal cavity and at the left naris with obstruction of the left naris due to edema. Fairly advanced osteoarthritic change in the temporomandibular joints, more severe on the right than on the  left. CT cervical spine: No fracture or spondylolisthesis. Multilevel arthropathy. Carotid artery calcification bilaterally. Scarring in the lung apices bilaterally. Electronically Signed   By: Lowella Grip III M.D.   On: 12/11/2015 11:44   Ct Cervical Spine Wo Contrast  12/11/2015  CLINICAL DATA:  Pain following fall.  History of breast carcinoma EXAM: CT HEAD WITHOUT CONTRAST CT MAXILLOFACIAL WITHOUT CONTRAST CT CERVICAL SPINE WITHOUT CONTRAST TECHNIQUE: Multidetector CT imaging of the head, cervical spine, and maxillofacial structures were performed using the standard protocol without intravenous contrast. Multiplanar CT image reconstructions of the cervical spine and maxillofacial structures were also generated. COMPARISON:  Head CT and brain MRI November 14, 2015 FINDINGS: CT HEAD FINDINGS Mild diffuse atrophy is stable. There is no intracranial mass, hemorrhage, extra-axial fluid collection, or midline shift. There is small vessel disease throughout the centra semiovale  bilaterally. The recently noted small lacunar infarct in the right centrum semiovale is not discernible from the surrounding small vessel disease in this region by CT. No acute infarct is demonstrable on this study. There are calcifications in each cavernous carotid artery. No hyperdense lesions are identified. There is a right frontal scalp hematoma. No calvarial fracture is evident. The mastoid air cells are clear. The visualized paranasal sinuses are clear. Visualized orbits appear symmetric and unremarkable bilaterally. CT MAXILLOFACIAL FINDINGS There is soft tissue injury over the nasal region. There is a fracture of the distal left nasal bone with alignment near anatomic. There is soft tissue swelling over the right frontal sinus. No other fractures are evident. No dislocations. No intraorbital lesions are evident. Orbits appear symmetric bilaterally. There is opacification of the left near ease due to edema. Paranasal sinuses are clear. Ostiomeatal unit complexes are patent bilaterally. 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 Salivary glands appear unremarkable. No adenopathy. Visualized pharynx appears unremarkable. CT CERVICAL SPINE FINDINGS There is no fracture or spondylolisthesis. Prevertebral soft tissues and predental space regions are normal. There is partial ankylosis of the C4-5 disc. There is moderately severe disc space narrowing at C5-6 and C6-7. There is slightly milder narrowing at C7-T1. There is multilevel facet hypertrophy. There is exit foraminal narrowing due to bony hypertrophy at several levels, most marked at C3-4 on the left. There is scarring in the lung apices bilaterally. There are foci of carotid artery calcification bilaterally. IMPRESSION: CT head: Atrophy with extensive periventricular small vessel disease. No hemorrhage or mass. No acute infarct is evident on this study. There is a right frontal scalp  hematoma. No acute fracture evident. CT maxillofacial: Slightly displaced fracture of the distal most aspect left nasal bone. Overlying soft tissue injury in the nasal region. Soft tissue swelling upper right face and over the right frontal sinus region without fracture underlying. Paranasal sinuses clear. Ostiomeatal unit complexes patent. Edema in the anterior nasal cavity and at the left naris with obstruction of the left naris due to edema. 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 CT cervical spine: No fracture or spondylolisthesis. Multilevel arthropathy. Carotid artery calcification bilaterally. Scarring in the lung apices bilaterally. Electronically Signed   By: Lowella Grip III M.D.   On: 12/11/2015 11:44   Ct Maxillofacial Wo Cm  12/11/2015  CLINICAL DATA:  Pain following fall.  History of breast carcinoma EXAM: CT HEAD WITHOUT CONTRAST CT MAXILLOFACIAL WITHOUT CONTRAST CT CERVICAL SPINE WITHOUT CONTRAST TECHNIQUE: Multidetector CT imaging of the head,  cervical spine, and maxillofacial structures were performed using the standard protocol without intravenous contrast. Multiplanar CT image reconstructions of the cervical spine and maxillofacial structures were also generated. COMPARISON:  Head CT and brain MRI November 14, 2015 FINDINGS: CT HEAD FINDINGS Mild diffuse atrophy is stable. There is no intracranial mass, hemorrhage, extra-axial fluid collection, or midline shift. There is small vessel disease throughout the centra semiovale bilaterally. The recently noted small lacunar infarct in the right centrum semiovale is not discernible from the surrounding small vessel disease in this region by CT. No acute infarct is demonstrable on this study. There are calcifications in each cavernous carotid artery. No hyperdense lesions are identified. There is a right frontal scalp hematoma. No calvarial fracture is evident. The mastoid air  cells are clear. The visualized paranasal sinuses are clear. Visualized orbits appear symmetric and unremarkable bilaterally. CT MAXILLOFACIAL FINDINGS There is soft tissue injury over the nasal region. There is a fracture of the distal left nasal bone with alignment near anatomic. There is soft tissue swelling over the right frontal sinus. No other fractures are evident. No dislocations. No intraorbital lesions are evident. Orbits appear symmetric bilaterally. There is opacification of the left near ease due to edema. Paranasal sinuses are clear. Ostiomeatal unit complexes are patent bilaterally. 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 Salivary glands appear unremarkable. No adenopathy. Visualized pharynx appears unremarkable. CT CERVICAL SPINE FINDINGS There is no fracture or spondylolisthesis. Prevertebral soft tissues and predental space regions are normal. There is partial ankylosis of the C4-5 disc. There is moderately severe disc space narrowing at C5-6 and C6-7. There is slightly milder narrowing at C7-T1. There is multilevel facet hypertrophy. There is exit foraminal narrowing due to bony hypertrophy at several levels, most marked at C3-4 on the left. There is scarring in the lung apices bilaterally. There are foci of carotid artery calcification bilaterally. IMPRESSION: CT head: Atrophy with extensive periventricular small vessel disease. No hemorrhage or mass. No acute infarct is evident on this study. There is a right frontal scalp hematoma. No acute fracture evident. CT maxillofacial: Slightly displaced fracture of the distal most aspect left nasal bone. Overlying soft tissue injury in the nasal region. Soft tissue swelling upper right face and over the right frontal sinus region without fracture underlying. Paranasal sinuses clear. Ostiomeatal unit complexes patent. Edema in the anterior nasal cavity and at the left naris  with obstruction of the left naris due to edema. 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 CT cervical spine: No fracture or spondylolisthesis. Multilevel arthropathy. Carotid artery calcification bilaterally. Scarring in the lung apices bilaterally. Electronically Signed   By: Lowella Grip III M.D.   On: 12/11/2015 11:44   I have personally reviewed and evaluated these images and lab results as part of my medical decision-making.   EKG Interpretation None      MDM   Final diagnoses:  Fall, initial encounter  Nasal fracture, open, initial encounter  Hematoma  Platelet inhibition due to Plavix  Head injury, initial encounter   Patient also balance and fell while on Plavix. At this time, CT head does not show intracranial bleed. Patient's neurologic examination is normal. She does have minor nasal bone fracture with nondisplacement and an overlying laceration. Laceration was cleaned and repaired. The patient will be placed on Augmentin. Tetanus is up-to-date. He does not have apparent extremity or thoracic  or abdominal injury requiring further diagnostic evaluation. Consideration is in the care of her son and daughter-in-law. Instructions are provided for elevating head of bed, ice packs and head injury precautions. Plan will be for Percocet as needed for pain, Lasix if any constipation associated Percocet. Augmentin for open nasal fracture with repaired laceration. There is no active bleeding. Physical examination and CT exam did not show nasal septal hematoma.    Charlesetta Shanks, MD 12/11/15 [phone]

## 2015-12-11 NOTE — ED Notes (Signed)
Oral care completed.

## 2015-12-11 NOTE — ED Notes (Signed)
Assisted patient with bedpan 

## 2015-12-11 NOTE — Telephone Encounter (Signed)
Calling stating med has been approved and they are sending approval letter.Carmen Duncan

## 2015-12-11 NOTE — Discharge Instructions (Signed)
Head Injury, Adult You have received a head injury. It does not appear serious at this time. Headaches and vomiting are common following head injury. It should be easy to awaken from sleeping. Sometimes it is necessary for you to stay in the emergency department for a while for observation. Sometimes admission to the hospital may be needed. After injuries such as yours, most problems occur within the first 24 hours, but side effects may occur up to 7-10 days after the injury. It is important for you to carefully monitor your condition and contact your health care provider or seek immediate medical care if there is a change in your condition. WHAT ARE THE TYPES OF HEAD INJURIES? Head injuries can be as minor as a bump. Some head injuries can be more severe. More severe head injuries include:  A jarring injury to the brain (concussion).  A bruise of the brain (contusion). This mean there is bleeding in the brain that can cause swelling.  A cracked skull (skull fracture).  Bleeding in the brain that collects, clots, and forms a bump (hematoma). WHAT CAUSES A HEAD INJURY? A serious head injury is most likely to happen to someone who is in a car wreck and is not wearing a seat belt. Other causes of major head injuries include bicycle or motorcycle accidents, sports injuries, and falls. HOW ARE HEAD INJURIES DIAGNOSED? A complete history of the event leading to the injury and your current symptoms will be helpful in diagnosing head injuries. Many times, pictures of the brain, such as CT or MRI are needed to see the extent of the injury. Often, an overnight hospital stay is necessary for observation.  WHEN SHOULD I SEEK IMMEDIATE MEDICAL CARE?  You should get help right away if:  You have confusion or drowsiness.  You feel sick to your stomach (nauseous) or have continued, forceful vomiting.  You have dizziness or unsteadiness that is getting worse.  You have severe, continued headaches not  relieved by medicine. Only take over-the-counter or prescription medicines for pain, fever, or discomfort as directed by your health care provider.  You do not have normal function of the arms or legs or are unable to walk.  You notice changes in the black spots in the center of the colored part of your eye (pupil).  You have a clear or bloody fluid coming from your nose or ears.  You have a loss of vision. During the next 24 hours after the injury, you must stay with someone who can watch you for the warning signs. This person should contact local emergency services (911 in the [place] if you have seizures, you become unconscious, or you are unable to wake up. HOW CAN I PREVENT A HEAD INJURY IN THE FUTURE? The most important factor for preventing major head injuries is avoiding motor vehicle accidents. To minimize the potential for damage to your head, it is crucial to wear seat belts while riding in motor vehicles. Wearing helmets while bike riding and playing collision sports (like football) is also helpful. Also, avoiding dangerous activities around the house will further help reduce your risk of head injury.  WHEN CAN I RETURN TO NORMAL ACTIVITIES AND ATHLETICS? You should be reevaluated by your health care provider before returning to these activities. If you have any of the following symptoms, you should not return to activities or contact sports until 1 week after the symptoms have stopped:  Persistent headache.  Dizziness or vertigo.  Poor attention and concentration.  Confusion.  Memory problems.  Nausea or vomiting.  Fatigue or tire easily.  Irritability.  Intolerant of bright lights or loud noises.  Anxiety or depression.  Disturbed sleep. MAKE SURE YOU:   Understand these instructions.  Will watch your condition.  Will get help right away if you are not doing well or get worse.   This information is not intended to replace advice given to you by your health  care provider. Make sure you discuss any questions you have with your health care provider.   Document Released: 05/13/2005 Document Revised: 06/03/2014 Document Reviewed: 01/18/2013 Elsevier Interactive Patient Education 2016 [employer] Nasal Fracture A nasal fracture is a break or crack in the bones or cartilage of the nose. Minor breaks do not require treatment. These breaks usually heal on their own after about one month. Serious breaks may require surgery. CAUSES This injury is usually caused by a blunt injury to the nose. This type of injury often occurs from:  Contact sports.  Car accidents.  Falls.  Getting punched. SYMPTOMS Symptoms of this injury include:  Pain.  Swelling of the nose.  Bleeding from the nose.  Bruising around the nose or eyes. This may include having black eyes.  Crooked appearance of the nose. DIAGNOSIS This injury may be diagnosed with a physical exam. The health care provider will gently feel the nose for signs of broken bones. He or she will look inside the nostrils to make sure that there is not a blood-filled swelling on the dividing wall between the nostrils (septal hematoma). X-rays of the nose may not show a nasal fracture even when one is present. In some cases, X-rays or a CT scan may be done 1-5 days after the injury. Sometimes, the health care provider will want to wait until the swelling has gone down. TREATMENT Often, minor fractures that have caused no deformity do not require treatment. More serious fractures in which bones have moved out of position may require surgery, which will take place after the swelling is gone. Surgery will stabilize and align the fracture. In some cases, a health care provider may be able to reposition the bones without surgery. This may be done in the health care provider's office after medicine is given to numb the area (local anesthetic). HOME CARE INSTRUCTIONS  If directed, apply ice to the injured  area:  Put ice in a plastic bag.  Place a towel between your skin and the bag.  Leave the ice on for 20 minutes, 2-3 times per day.  Take over-the-counter and prescription medicines only as told by your health care provider.  If your nose starts to bleed, sit in an upright position while you squeeze the soft parts of your nose against the dividing wall between your nostrils (septum) for 10 minutes.  Try to avoid blowing your nose.  Return to your normal activities as told by your health care provider. Ask your health care provider what activities are safe for you.  Avoid contact sports for 3-4 weeks or as told by your health care provider.  Keep all follow-up visits as told by your health care provider. This is important. SEEK MEDICAL CARE IF:  Your pain increases or becomes severe.  You continue to have nosebleeds.  The shape of your nose does not return to normal within 5 days.  You have pus draining out of your nose. SEEK IMMEDIATE MEDICAL CARE IF:  You have bleeding from your nose that does not stop after you pinch your nostrils  closed for 20 minutes and keep ice on your nose.  You have clear fluid draining out of your nose.  You notice a grape-like swelling on the septum. This swelling is a collection of blood (hematoma) that must be drained to help prevent infection.  You have difficulty moving your eyes.  You have repeated vomiting.   This information is not intended to replace advice given to you by your health care provider. Make sure you discuss any questions you have with your health care provider.   Document Released: 05/10/2000 Document Revised: 02/01/2015 Document Reviewed: 06/20/2014 Elsevier Interactive Patient Education 2016 [place], Adult A laceration is a cut that goes through all of the layers of the skin and into the tissue that is right under the skin. Some lacerations heal on their own. Others need to be closed with stitches  (sutures), staples, skin adhesive strips, or skin glue. Proper laceration care minimizes the risk of infection and helps the laceration to heal better. HOW TO CARE FOR YOUR LACERATION If sutures or staples were used:  Keep the wound clean and dry.  If you were given a bandage (dressing), you should change it at least one time per day or as told by your health care provider. You should also change it if it becomes wet or dirty.  Keep the wound completely dry for the first 24 hours or as told by your health care provider. After that time, you may shower or bathe. However, make sure that the wound is not soaked in water until after the sutures or staples have been removed.  Clean the wound one time each day or as told by your health care provider:  Wash the wound with soap and water.  Rinse the wound with water to remove all soap.  Pat the wound dry with a clean towel. Do not rub the wound.  After cleaning the wound, apply a thin layer of antibiotic ointmentas told by your health care provider. This will help to prevent infection and keep the dressing from sticking to the wound.  Have the sutures or staples removed as told by your health care provider. If skin adhesive strips were used:  Keep the wound clean and dry.  If you were given a bandage (dressing), you should change it at least one time per day or as told by your health care provider. You should also change it if it becomes dirty or wet.  Do not get the skin adhesive strips wet. You may shower or bathe, but be careful to keep the wound dry.  If the wound gets wet, pat it dry with a clean towel. Do not rub the wound.  Skin adhesive strips fall off on their own. You may trim the strips as the wound heals. Do not remove skin adhesive strips that are still stuck to the wound. They will fall off in time. If skin glue was used:  Try to keep the wound dry, but you may briefly wet it in the shower or bath. Do not soak the wound in  water, such as by swimming.  After you have showered or bathed, gently pat the wound dry with a clean towel. Do not rub the wound.  Do not do any activities that will make you sweat heavily until the skin glue has fallen off on its own.  Do not apply liquid, cream, or ointment medicine to the wound while the skin glue is in place. Using those may loosen the film before  the wound has healed.  If you were given a bandage (dressing), you should change it at least one time per day or as told by your health care provider. You should also change it if it becomes dirty or wet.  If a dressing is placed over the wound, be careful not to apply tape directly over the skin glue. Doing that may cause the glue to be pulled off before the wound has healed.  Do not pick at the glue. The skin glue usually remains in place for 5-10 days, then it falls off of the skin. General Instructions  Take over-the-counter and prescription medicines only as told by your health care provider.  If you were prescribed an antibiotic medicine or ointment, take or apply it as told by your doctor. Do not stop using it even if your condition improves.  To help prevent scarring, make sure to cover your wound with sunscreen whenever you are outside after stitches are removed, after adhesive strips are removed, or when glue remains in place and the wound is healed. Make sure to wear a sunscreen of at least 30 SPF.  Do not scratch or pick at the wound.  Keep all follow-up visits as told by your health care provider. This is important.  Check your wound every day for signs of infection. Watch for:  Redness, swelling, or pain.  Fluid, blood, or pus.  Raise (elevate) the injured area above the level of your heart while you are sitting or lying down, if possible. SEEK MEDICAL CARE IF:  You received a tetanus shot and you have swelling, severe pain, redness, or bleeding at the injection site.  You have a fever.  A wound that  was closed breaks open.  You notice a bad smell coming from your wound or your dressing.  You notice something coming out of the wound, such as wood or glass.  Your pain is not controlled with medicine.  You have increased redness, swelling, or pain at the site of your wound.  You have fluid, blood, or pus coming from your wound.  You notice a change in the color of your skin near your wound.  You need to change the dressing frequently due to fluid, blood, or pus draining from the wound.  You develop a new rash.  You develop numbness around the wound. SEEK IMMEDIATE MEDICAL CARE IF:  You develop severe swelling around the wound.  Your pain suddenly increases and is severe.  You develop painful lumps near the wound or on skin that is anywhere on your body.  You have a red streak going away from your wound.  The wound is on your hand or foot and you cannot properly move a finger or toe.  The wound is on your hand or foot and you notice that your fingers or toes look pale or bluish.   This information is not intended to replace advice given to you by your health care provider. Make sure you discuss any questions you have with your health care provider.   Document Released: 05/13/2005 Document Revised: 09/27/2014 Document Reviewed: 05/09/2014 Elsevier Interactive Patient Education [employer].

## 2015-12-12 ENCOUNTER — Other Ambulatory Visit: Payer: Self-pay | Admitting: *Deleted

## 2015-12-12 DIAGNOSIS — C50411 Malignant neoplasm of upper-outer quadrant of right female breast: Secondary | ICD-10-CM

## 2015-12-14 ENCOUNTER — Ambulatory Visit
Admit: 2015-12-14 | Discharge: 2015-12-14 | Disposition: A | Discharge status: Home / Self Care | Payer: Medicare Other | Attending: Radiation Oncology | Admitting: Radiation Oncology

## 2015-12-14 ENCOUNTER — Encounter: Payer: Self-pay | Admitting: Radiation Oncology

## 2015-12-14 VITALS — BP 153/65 | HR 86 | Temp 97.8°F | Ht 61.0 in | Wt 128.2 lb

## 2015-12-14 DIAGNOSIS — C50411 Malignant neoplasm of upper-outer quadrant of right female breast: Secondary | ICD-10-CM | POA: Diagnosis not present

## 2015-12-14 NOTE — Progress Notes (Signed)
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 Question stroke on 11/14/15.  Recent fall with bruising of her face with stiches in her nose.

## 2015-12-14 NOTE — Addendum Note (Signed)
Encounter addended by: Benn Moulder, RN on: 12/14/2015  4:21 PM<BR>     Documentation filed: Charges VN

## 2015-12-14 NOTE — Progress Notes (Addendum)
Radiation Oncology         ([phone]) [phone] ________________________________  Name: Carmen Duncan MRN: [id number]  Date: 12/14/2015  DOB: [birth year]  Follow-Up Visit Note  CC: [name], MD  Autumn Messing III, MD  Diagnosis:   DCIS of the right breast    ICD-9-CM ICD-10-CM   1. Breast cancer of upper-outer quadrant of right female breast ([place]) 174.4 C50.411     Interval Since Last Radiation:  8  weeks  09/28/2015 through 10/26/2015: The patient initially received a dose of 42.5 Gy in 17 fractions to the breast using whole-breast tangent fields. This was delivered using a 3-D conformal technique. The patient then received a boost to the seroma. This delivered an additional 7.5 Gy in 3 fractions using a 3 field photon technique due to the depth of the seroma. The total dose was 50 Gy.  Narrative:  The patient returns today for routine follow-up.  Since her last visit, and in June she was admitted for a CVA and received physical therapy in the inpatient rehabilitation Center at [facility]. She states that she is been doing better and working with physical therapy however spell in the garage on cement 3 days ago which prompted an evaluation in the emergency department. Fortunately the only trauma that occurred with bruising contusion of her knees, facial bruising and a hairline fracture of nasal bone. She had a laceration along her nasal bridge that was sutured, and she comes today for follow-up. Since undergoing evaluation with a loop recorder, she is developed significant bruising of the left breast, her right treated breast however is intact and without concerns from the patient or her daughter-in-law. She denies any chest pain or shortness of breath, blurred vision double vision dizziness or weakness. No other complaints or verbalized.                              ALLERGIES:  is allergic to latex; sulfonamide derivatives; and tape.  Meds: Current Outpatient Prescriptions  Medication Sig  Dispense Refill  . acetaminophen (TYLENOL) 325 MG tablet Take 325 mg by mouth every 6 (six) hours as needed for headache.     [name] amoxicillin-clavulanate (AUGMENTIN) 875-125 MG tablet Take 1 tablet by mouth 2 (two) times daily. One po bid x 7 days 14 tablet 0  . atorvastatin (LIPITOR) 20 MG tablet Take 1 tablet (20 mg total) by mouth daily. 30 tablet 11  . clidinium-chlordiazePOXIDE (LIBRAX) 5-2.5 MG capsule Take 1 capsule by mouth 2 (two) times daily as needed. 60 capsule 3  . clopidogrel (PLAVIX) 75 MG tablet Take 1 tablet (75 mg total) by mouth daily. 30 tablet 5  . docusate sodium (COLACE) 100 MG capsule Take 1 capsule (100 mg total) by mouth every 12 (twelve) hours. 60 capsule 0  . Nintedanib ([name]) 100 MG CAPS Take 100 mg by mouth 2 (two) times daily.    [name] oxyCODONE-acetaminophen (PERCOCET) 5-325 MG tablet Take 2 tablets by mouth every 4 (four) hours as needed. 20 tablet 0  . pantoprazole (PROTONIX) 40 MG tablet Take 1 tablet (40 mg total) by mouth daily. 30 tablet 5  . propranolol (INDERAL) 20 MG tablet Take 1 tablet (20 mg total) by mouth daily. 30 tablet 6   No current facility-administered medications for this encounter.    Physical Findings:  height is 5\' 1"  (1.549 m) and weight is 128 lb 3.2 oz (58.151 kg). Her temperature is 97.8 F (36.6  C). Her blood pressure is 153/65 and her pulse is 86. Her oxygen saturation is 100%.   In general this is a thin and somewhat frail Caucasian female with significant bruising over her eyes, and axillary region, and nose with suture present at the bridge of her nose. She is in no acute distress. She's alert and oriented x4 and appropriate throughout the examination. Cardiopulmonary assessment is negative for acute distress and she exhibits normal effort. The right breast is assessed, and revealed some dry skin and sloughing of previously hyperpigmented changes of the skin. No desquamation or skin breakdown is identified. The left breast has diffuse  ecchymosis from the procedure was done to place her loop recorder.   Lab Findings: Lab Results  Component Value Date   WBC 8.0 12/11/2015   WBC 5.7 07/26/2015   HGB 14.7 12/11/2015   HGB 13.7 07/26/2015   HCT 44.2 12/11/2015   HCT 41.4 07/26/2015   PLT 212 12/11/2015   PLT 217 07/26/2015    Lab Results  Component Value Date   NA 140 12/11/2015   NA 139 07/26/2015   NA 139 05/03/2014   K 3.9 12/11/2015   K 4.8 07/26/2015   K 3.2* 05/03/2014   CHLORIDE 105 07/26/2015   CO2 28 12/11/2015   CO2 28 07/26/2015   CO2 33* 05/03/2014   GLUCOSE 94 12/11/2015   GLUCOSE 86 07/26/2015   GLUCOSE 84 05/03/2014   BUN 6 12/11/2015   BUN 13.3 07/26/2015   BUN 17 05/03/2014   CREATININE 0.62 12/11/2015   CREATININE 0.8 07/26/2015   CREATININE 0.82 05/03/2014   BILITOT 0.7 11/20/2015   BILITOT 0.37 07/26/2015   ALKPHOS 69 11/20/2015   ALKPHOS 79 07/26/2015   AST 23 11/20/2015   AST 20 07/26/2015   ALT 20 11/20/2015   ALT 13 07/26/2015   PROT 7.0 11/20/2015   PROT 7.7 07/26/2015   ALBUMIN 3.1* 11/20/2015   ALBUMIN 3.3* 07/26/2015   CALCIUM 9.2 12/11/2015   CALCIUM 9.4 07/26/2015   CALCIUM 8.4* 05/03/2014   ANIONGAP 5 12/11/2015   ANIONGAP 6 07/26/2015   ANIONGAP 3* 05/03/2014    Radiographic Findings: Dg Chest 2 View  11/14/2015  CLINICAL DATA:  CEREBRUM , stroke EXAM: CHEST - 2 VIEW COMPARISON:  CT 09/04/2015 and previous FINDINGS: Coarse linear scarring or subsegmental atelectasis in the lung bases, improved since previous radiograph of 06/08/2014. No confluent airspace disease or overt edema. Heart size upper limits normal. Atheromatous aorta, mildly tortuous. No effusion.  No pneumothorax.  Surgical clips right axilla. Anterior vertebral endplate spurring at multiple levels in the mid thoracic spine. IMPRESSION: 1. Coarse probably chronic bibasilar interstitial opacities. No definite acute or superimposed abnormality. 2.  Aortic Atherosclerosis (ICD10-170.0) Electronically  Signed   By: Lucrezia Europe M.D.   On: 11/14/2015 22:42   Ct Head Wo Contrast  12/11/2015  CLINICAL DATA:  Pain following fall.  History of breast carcinoma EXAM: CT HEAD WITHOUT CONTRAST CT MAXILLOFACIAL WITHOUT CONTRAST CT CERVICAL SPINE WITHOUT CONTRAST TECHNIQUE: Multidetector CT imaging of the head, cervical spine, and maxillofacial structures were performed using the standard protocol without intravenous contrast. Multiplanar CT image reconstructions of the cervical spine and maxillofacial structures were also generated. COMPARISON:  Head CT and brain MRI November 14, 2015 FINDINGS: CT HEAD FINDINGS Mild diffuse atrophy is stable. There is no intracranial mass, hemorrhage, extra-axial fluid collection, or midline shift. There is small vessel disease throughout the centra semiovale bilaterally. The recently noted small lacunar infarct in the  right centrum semiovale is not discernible from the surrounding small vessel disease in this region by CT. No acute infarct is demonstrable on this study. There are calcifications in each cavernous carotid artery. No hyperdense lesions are identified. There is a right frontal scalp hematoma. No calvarial fracture is evident. The mastoid air cells are clear. The visualized paranasal sinuses are clear. Visualized orbits appear symmetric and unremarkable bilaterally. CT MAXILLOFACIAL FINDINGS There is soft tissue injury over the nasal region. There is a fracture of the distal left nasal bone with alignment near anatomic. There is soft tissue swelling over the right frontal sinus. No other fractures are evident. No dislocations. No intraorbital lesions are evident. Orbits appear symmetric bilaterally. There is opacification of the left near ease due to edema. Paranasal sinuses are clear. Ostiomeatal unit complexes are patent bilaterally. 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 Salivary glands appear  unremarkable. No adenopathy. Visualized pharynx appears unremarkable. CT CERVICAL SPINE FINDINGS There is no fracture or spondylolisthesis. Prevertebral soft tissues and predental space regions are normal. There is partial ankylosis of the C4-5 disc. There is moderately severe disc space narrowing at C5-6 and C6-7. There is slightly milder narrowing at C7-T1. There is multilevel facet hypertrophy. There is exit foraminal narrowing due to bony hypertrophy at several levels, most marked at C3-4 on the left. There is scarring in the lung apices bilaterally. There are foci of carotid artery calcification bilaterally. IMPRESSION: CT head: Atrophy with extensive periventricular small vessel disease. No hemorrhage or mass. No acute infarct is evident on this study. There is a right frontal scalp hematoma. No acute fracture evident. CT maxillofacial: Slightly displaced fracture of the distal most aspect left nasal bone. Overlying soft tissue injury in the nasal region. Soft tissue swelling upper right face and over the right frontal sinus region without fracture underlying. Paranasal sinuses clear. Ostiomeatal unit complexes patent. Edema in the anterior nasal cavity and at the left naris with obstruction of the left naris due to edema. 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 CT cervical spine: No fracture or spondylolisthesis. Multilevel arthropathy. Carotid artery calcification bilaterally. Scarring in the lung apices bilaterally. Electronically Signed   By: Lowella Grip III M.D.   On: 12/11/2015 11:44   Ct Head Wo Contrast  11/14/2015  CLINICAL DATA:  Acute onset of stroke-like symptoms. Initial encounter. EXAM: CT HEAD WITHOUT CONTRAST TECHNIQUE: Contiguous axial images were obtained from the base of the skull through the vertex without intravenous contrast. COMPARISON:  MRI of the brain performed 12/16/2014 FINDINGS: There is no evidence  of acute infarction, mass lesion, or intra- or extra-axial hemorrhage on CT. Prominence of the ventricles and sulci reflects mild cortical volume loss. Scattered periventricular and subcortical white matter change likely reflects small vessel ischemic microangiopathy. The brainstem and fourth ventricle are within normal limits. The basal ganglia are unremarkable in appearance. The cerebral hemispheres demonstrate grossly normal gray-white differentiation. No mass effect or midline shift is seen. There is no evidence of fracture; visualized osseous structures are unremarkable in appearance. The orbits are within normal limits. The paranasal sinuses and mastoid air cells are well-aerated. No significant soft tissue abnormalities are seen. IMPRESSION: 1. No acute intracranial pathology seen on CT. 2. Mild cortical volume loss and scattered small vessel ischemic microangiopathy. Electronically Signed   By: Garald Balding M.D.   On: 11/14/2015 19:46   Ct  Cervical Spine Wo Contrast  12/11/2015  CLINICAL DATA:  Pain following fall.  History of breast carcinoma EXAM: CT HEAD WITHOUT CONTRAST CT MAXILLOFACIAL WITHOUT CONTRAST CT CERVICAL SPINE WITHOUT CONTRAST TECHNIQUE: Multidetector CT imaging of the head, cervical spine, and maxillofacial structures were performed using the standard protocol without intravenous contrast. Multiplanar CT image reconstructions of the cervical spine and maxillofacial structures were also generated. COMPARISON:  Head CT and brain MRI November 14, 2015 FINDINGS: CT HEAD FINDINGS Mild diffuse atrophy is stable. There is no intracranial mass, hemorrhage, extra-axial fluid collection, or midline shift. There is small vessel disease throughout the centra semiovale bilaterally. The recently noted small lacunar infarct in the right centrum semiovale is not discernible from the surrounding small vessel disease in this region by CT. No acute infarct is demonstrable on this study. There are  calcifications in each cavernous carotid artery. No hyperdense lesions are identified. There is a right frontal scalp hematoma. No calvarial fracture is evident. The mastoid air cells are clear. The visualized paranasal sinuses are clear. Visualized orbits appear symmetric and unremarkable bilaterally. CT MAXILLOFACIAL FINDINGS There is soft tissue injury over the nasal region. There is a fracture of the distal left nasal bone with alignment near anatomic. There is soft tissue swelling over the right frontal sinus. No other fractures are evident. No dislocations. No intraorbital lesions are evident. Orbits appear symmetric bilaterally. There is opacification of the left near ease due to edema. Paranasal sinuses are clear. Ostiomeatal unit complexes are patent bilaterally. 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 Salivary glands appear unremarkable. No adenopathy. Visualized pharynx appears unremarkable. CT CERVICAL SPINE FINDINGS There is no fracture or spondylolisthesis. Prevertebral soft tissues and predental space regions are normal. There is partial ankylosis of the C4-5 disc. There is moderately severe disc space narrowing at C5-6 and C6-7. There is slightly milder narrowing at C7-T1. There is multilevel facet hypertrophy. There is exit foraminal narrowing due to bony hypertrophy at several levels, most marked at C3-4 on the left. There is scarring in the lung apices bilaterally. There are foci of carotid artery calcification bilaterally. IMPRESSION: CT head: Atrophy with extensive periventricular small vessel disease. No hemorrhage or mass. No acute infarct is evident on this study. There is a right frontal scalp hematoma. No acute fracture evident. CT maxillofacial: Slightly displaced fracture of the distal most aspect left nasal bone. Overlying soft tissue injury in the nasal region. Soft tissue swelling upper right face and over  the right frontal sinus region without fracture underlying. Paranasal sinuses clear. Ostiomeatal unit complexes patent. Edema in the anterior nasal cavity and at the left naris with obstruction of the left naris due to edema. 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 CT cervical spine: No fracture or spondylolisthesis. Multilevel arthropathy. Carotid artery calcification bilaterally. Scarring in the lung apices bilaterally. Electronically Signed   By: Lowella Grip III M.D.   On: 12/11/2015 11:44   Mr Angiogram Head Wo Contrast  11/14/2015  CLINICAL DATA:  New onset LEFT-sided weakness, possibly beginning yesterday. LEFT facial droop today. History of hypertension, hyperlipidemia, stroke, breast cancer. EXAM: MRI HEAD WITHOUT CONTRAST MRA HEAD WITHOUT CONTRAST TECHNIQUE: Multiplanar, multiecho pulse sequences of the brain and surrounding structures were obtained without intravenous contrast. Angiographic images of the head were obtained using MRA technique without contrast. COMPARISON:  CT HEAD November 14, 2015 at 1920 hours and  MRI of the brain December 16, 2014 and MRA head August 23, 2014. FINDINGS: MRI HEAD FINDINGS INTRACRANIAL CONTENTS: 1 cm rounded area of reduced diffusion RIGHT corona radiata. 1 cm reduced diffusion RIGHT frontal periventricular white matter/ body corpus callosum. Both areas demonstrate low ADC values. No susceptibility artifact to suggest hemorrhage. The ventricles and sulci are mildly prominent for patient's age. Patchy to confluent supratentorial and pontine white matter FLAIR T2 hyperintensities. Small area LEFT mesial occipital lobe encephalomalacia. No suspicious parenchymal signal, masses or mass effect. No abnormal extra-axial fluid collections. No extra-axial masses though, contrast enhanced sequences would be more sensitive. Normal major intracranial vascular flow voids present at skull base. ORBITS: The  included ocular globes and orbital contents are non-suspicious. Status post bilateral ocular lens implants. SINUSES: The mastoid air-cells and included paranasal sinuses are well-aerated. Known LEFT internal auditory canal schwannoma not conspicuous by noncontrast examination. SKULL/SOFT TISSUES: No abnormal sellar expansion. No suspicious calvarial bone marrow signal. Craniocervical junction maintained. MRA HEAD FINDINGS ANTERIOR CIRCULATION: Normal flow related enhancement of the included cervical, petrous, cavernous and supraclinoid internal carotid arteries. 2 mm laterally directed wide necked aneurysm RIGHT cavernous internal carotid artery is better seen on this high field strength examination. Patent anterior communicating artery. Normal flow related enhancement of the anterior and middle cerebral arteries, including distal segments. No large vessel occlusion, high-grade stenosis. Moderate luminal regularity of the middle cerebral arteries. POSTERIOR CIRCULATION: Codominant vertebral artery's. Basilar artery is patent, with normal flow related enhancement of the main branch vessels. Normal flow related enhancement of the posterior cerebral arteries. Moderate stenosis proximal RIGHT P3 segment. Moderate stenosis LEFT P3. Robust RIGHT posterior communicating artery. Luminal irregularity of the posterior cerebral arteries. No large vessel occlusion, aneurysm. IMPRESSION: MRI HEAD: Acute 1 cm RIGHT corona radiata and RIGHT frontal periventricular white matter infarcts. Stable moderate global brain atrophy and moderate to severe chronic small vessel ischemic disease. 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 MRA HEAD: No emergent large vessel occlusion. 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 Moderate stenoses of the posterior cerebral arteries and general luminal irregularity of the intracranial vessels compatible with atherosclerosis. Electronically Signed   By: Elon Alas M.D.   On:  11/14/2015 22:56   Mr Brain Wo Contrast  11/14/2015  CLINICAL DATA:  New onset LEFT-sided weakness, possibly beginning yesterday. LEFT facial droop today. History of hypertension, hyperlipidemia, stroke, breast cancer. EXAM: MRI HEAD WITHOUT CONTRAST MRA HEAD WITHOUT CONTRAST TECHNIQUE: Multiplanar, multiecho pulse sequences of the brain and surrounding structures were obtained without intravenous contrast. Angiographic images of the head were obtained using MRA technique without contrast. COMPARISON:  CT HEAD November 14, 2015 at 1920 hours and MRI of the brain December 16, 2014 and MRA head August 23, 2014. FINDINGS: MRI HEAD FINDINGS INTRACRANIAL CONTENTS: 1 cm rounded area of reduced diffusion RIGHT corona radiata. 1 cm reduced diffusion RIGHT frontal periventricular white matter/ body corpus callosum. Both areas demonstrate low ADC values. No susceptibility artifact to suggest hemorrhage. The ventricles and sulci are mildly prominent for patient's age. Patchy to confluent supratentorial and pontine white matter FLAIR T2 hyperintensities. Small area LEFT mesial occipital lobe encephalomalacia. No suspicious parenchymal signal, masses or mass effect. No abnormal extra-axial fluid collections. No extra-axial masses though, contrast enhanced sequences would be more sensitive. Normal major intracranial vascular flow voids present at skull base. ORBITS: The included ocular globes and orbital contents are non-suspicious. Status post bilateral ocular lens implants. SINUSES: The mastoid air-cells and included paranasal sinuses are well-aerated. Known  LEFT internal auditory canal schwannoma not conspicuous by noncontrast examination. SKULL/SOFT TISSUES: No abnormal sellar expansion. No suspicious calvarial bone marrow signal. Craniocervical junction maintained. MRA HEAD FINDINGS ANTERIOR CIRCULATION: Normal flow related enhancement of the included cervical, petrous, cavernous and supraclinoid internal carotid arteries. 2 mm  laterally directed wide necked aneurysm RIGHT cavernous internal carotid artery is better seen on this high field strength examination. Patent anterior communicating artery. Normal flow related enhancement of the anterior and middle cerebral arteries, including distal segments. No large vessel occlusion, high-grade stenosis. Moderate luminal regularity of the middle cerebral arteries. POSTERIOR CIRCULATION: Codominant vertebral artery's. Basilar artery is patent, with normal flow related enhancement of the main branch vessels. Normal flow related enhancement of the posterior cerebral arteries. Moderate stenosis proximal RIGHT P3 segment. Moderate stenosis LEFT P3. Robust RIGHT posterior communicating artery. Luminal irregularity of the posterior cerebral arteries. No large vessel occlusion, aneurysm. IMPRESSION: MRI HEAD: Acute 1 cm RIGHT corona radiata and RIGHT frontal periventricular white matter infarcts. Stable moderate global brain atrophy and moderate to severe chronic small vessel ischemic disease. 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 MRA HEAD: No emergent large vessel occlusion. 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 Moderate stenoses of the posterior cerebral arteries and general luminal irregularity of the intracranial vessels compatible with atherosclerosis. Electronically Signed   By: Elon Alas M.D.   On: 11/14/2015 22:56   Ct Maxillofacial Wo Cm  12/11/2015  CLINICAL DATA:  Pain following fall.  History of breast carcinoma EXAM: CT HEAD WITHOUT CONTRAST CT MAXILLOFACIAL WITHOUT CONTRAST CT CERVICAL SPINE WITHOUT CONTRAST TECHNIQUE: Multidetector CT imaging of the head, cervical spine, and maxillofacial structures were performed using the standard protocol without intravenous contrast. Multiplanar CT image reconstructions of the cervical spine and maxillofacial structures were also generated. COMPARISON:  Head CT and brain MRI November 14, 2015 FINDINGS: CT HEAD FINDINGS Mild  diffuse atrophy is stable. There is no intracranial mass, hemorrhage, extra-axial fluid collection, or midline shift. There is small vessel disease throughout the centra semiovale bilaterally. The recently noted small lacunar infarct in the right centrum semiovale is not discernible from the surrounding small vessel disease in this region by CT. No acute infarct is demonstrable on this study. There are calcifications in each cavernous carotid artery. No hyperdense lesions are identified. There is a right frontal scalp hematoma. No calvarial fracture is evident. The mastoid air cells are clear. The visualized paranasal sinuses are clear. Visualized orbits appear symmetric and unremarkable bilaterally. CT MAXILLOFACIAL FINDINGS There is soft tissue injury over the nasal region. There is a fracture of the distal left nasal bone with alignment near anatomic. There is soft tissue swelling over the right frontal sinus. No other fractures are evident. No dislocations. No intraorbital lesions are evident. Orbits appear symmetric bilaterally. There is opacification of the left near ease due to edema. Paranasal sinuses are clear. Ostiomeatal unit complexes are patent bilaterally. 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 Salivary glands appear unremarkable. No adenopathy. Visualized pharynx appears unremarkable. CT CERVICAL SPINE FINDINGS There is no fracture or spondylolisthesis. Prevertebral soft tissues and predental space regions are normal. There is partial ankylosis of the C4-5 disc. There is moderately severe disc space narrowing at C5-6 and C6-7. There is slightly milder narrowing at C7-T1. There is multilevel facet hypertrophy. There is exit foraminal narrowing due to bony hypertrophy at several levels, most marked at C3-4 on the left. There is scarring in the  lung apices bilaterally. There are foci of carotid artery calcification  bilaterally. IMPRESSION: CT head: Atrophy with extensive periventricular small vessel disease. No hemorrhage or mass. No acute infarct is evident on this study. There is a right frontal scalp hematoma. No acute fracture evident. CT maxillofacial: Slightly displaced fracture of the distal most aspect left nasal bone. Overlying soft tissue injury in the nasal region. Soft tissue swelling upper right face and over the right frontal sinus region without fracture underlying. Paranasal sinuses clear. Ostiomeatal unit complexes patent. Edema in the anterior nasal cavity and at the left naris with obstruction of the left naris due to edema. 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 CT cervical spine: No fracture or spondylolisthesis. Multilevel arthropathy. Carotid artery calcification bilaterally. Scarring in the lung apices bilaterally. Electronically Signed   By: Lowella Grip III M.D.   On: 12/11/2015 11:44    Impression/Plan: 1. DCIS right Breast. The patient's indwelling tolerating her radiotherapy. We discussed that she would continue to be followed by Dr. Jana Hakim and has plans to see him back next summer. We would be happy to see her in the future if there are questions or concerns regarding her previous radiotherapy. 2. Recent fall. The patient has follow-up with Dr. Elease Hashimoto to have her sutures removed next week.  3. 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 The patient will be following up with her primary provider, and I've advised her to request a cardiology referral since her previous cardiologist is retired. She and her daughter in law state agreement and understanding    Carola Rhine, [facility]

## 2015-12-16 ENCOUNTER — Telehealth: Payer: Self-pay | Admitting: Oncology

## 2015-12-16 NOTE — Telephone Encounter (Signed)
S/w pt's son [name], gave appt for 8/17 @ 10am for survivorship clinic.

## 2015-12-18 ENCOUNTER — Ambulatory Visit (INDEPENDENT_AMBULATORY_CARE_PROVIDER_SITE_OTHER): Payer: Medicare Other | Admitting: *Deleted

## 2015-12-18 ENCOUNTER — Other Ambulatory Visit: Payer: Self-pay | Admitting: Family Medicine

## 2015-12-18 ENCOUNTER — Telehealth: Payer: Self-pay | Admitting: Cardiology

## 2015-12-18 DIAGNOSIS — I639 Cerebral infarction, unspecified: Secondary | ICD-10-CM | POA: Diagnosis not present

## 2015-12-18 NOTE — Progress Notes (Signed)
Carelink Summary Report / Loop Recorder 

## 2015-12-18 NOTE — Telephone Encounter (Signed)
Instructed pt son to call tech services. Pt verbalized understanding.

## 2015-12-18 NOTE — Telephone Encounter (Signed)
New message          1. Has your device fired? no  2. Is you device beeping? no  3. Are you experiencing draining or swelling at device site? no  4. Are you calling to see if we received your device transmission? The son received a letter stating the device is not downloading he is having trouble down loading the device  5. Have you passed out? no

## 2015-12-20 ENCOUNTER — Ambulatory Visit (INDEPENDENT_AMBULATORY_CARE_PROVIDER_SITE_OTHER): Payer: Medicare Other | Admitting: Family Medicine

## 2015-12-20 ENCOUNTER — Encounter: Payer: Self-pay | Admitting: Family Medicine

## 2015-12-20 VITALS — BP 122/100 | HR 84 | Temp 97.7°F | Ht 61.0 in | Wt 126.0 lb

## 2015-12-20 DIAGNOSIS — S0083XD Contusion of other part of head, subsequent encounter: Secondary | ICD-10-CM | POA: Diagnosis not present

## 2015-12-20 DIAGNOSIS — I639 Cerebral infarction, unspecified: Secondary | ICD-10-CM

## 2015-12-20 DIAGNOSIS — I10 Essential (primary) hypertension: Secondary | ICD-10-CM

## 2015-12-20 DIAGNOSIS — S022XXD Fracture of nasal bones, subsequent encounter for fracture with routine healing: Secondary | ICD-10-CM

## 2015-12-20 MED ORDER — AMLODIPINE BESYLATE 2.5 MG PO TABS: 2.5000 mg | ORAL_TABLET | Freq: Every day | ORAL | 5 refills | Status: DC

## 2015-12-20 NOTE — Patient Instructions (Signed)
Start Amlodipine one daily Consider home BP cuff and monitor blood pressure. Let's plan follow up in 3-4 weeks

## 2015-12-20 NOTE — Progress Notes (Signed)
Subjective:     Patient ID: Carmen Duncan, female   DOB: 02-16-1941, 75 y.o.   MRN: [id number]  HPI Patient is seen in follow-up from recent hospitalization for acute stroke, recent rehabilitation stay, and ER visit from 7/17 following fall at home with facial injury Her chronic problems include history of chronic diastolic heart failure, hypertension, pulmonary hypertension, idiopathic pulmonary fibrosis, B 12 deficiency, breast cancer, essential tremor  Sometime apparently on June 19 she developed some weakness and difficulties negotiating stairs. She had fallen and injured her left arm on the day of admission. Family noted facial droop on June 20 and at that point promptly got her into the hospital for further evaluation. CT scan head showed no acute abnormality. Neurology consulted. MRI brain right corona radiata and right frontal white matter infarcts felt to be embolic with unclear source. MR angiogram of the brain no large vessel occlusion. 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 No significant stenosis. Transesophageal echocardiogram normal left ventricular size. Ejection fraction 55-60%. Negative bubble study. No evidence for PFO or ASD. No embolic source noted. LDL cholesterol 64. Hemoglobin A1c 5.4%. Loop recorder placed  Blood pressure was elevated during hospitalization with permission for hypertension in the setting of suspected acute ischemic CVA. She was restarted on her propranolol which she has taken for essential tremor. She's not been on any other blood pressure medications. She remained on Lipitor for hyperlipidemia.  Patient was then transferred to rehabilitation and was there from June 23 through July 3. Her stay there was unremarkable. She was discharged home with outpatient physical therapy and occupational therapy and they both signed off from her care yesterday.  On July 17 she went out to get her newspaper and when returning to her house apparently lost balance and  fell forward landing on her face. She did not recall any loss of consciousness. She was taken to ER for further evaluation. CT head no acute bleed. CT cervical spine no fracture. CT maxillofacial fracture distal left nasal bone no orbital fractures. Patient was placed on Augmentin. She had laceration of the bridge of the nose which was repaired with 3 sutures. She was discharged back home. She's not had any recurrent falls since then.  She is accompanied by her son and daughter-in-law. She has both walker and cane but is not using them consistently. They inquire about travel wheelchair for ease of transportation when they are out.  Blood pressures been elevated since discharge. Recent blood pressure by home health nurse 153/65 with repeat today in office after rest 168/80 She has had occasional bifrontal headache. No fevers or chills. At this point, she seems to have regained most of her strength and her speech is back to normal.  Son has some concerns regarding short term memory loss.  Seems to be confused frequently with things like dates and days of week.  Past Medical History:  Diagnosis Date  . Acoustic neuroma ([place])   . Anxiety   . Arthritis   . Asthma   . Brain cancer ([place])   . Breast cancer of upper-outer quadrant of right female breast ([place]) 07/19/2015  . Diverticulosis   . Ejection fraction   . Essential tremor    on inderal  . GERD (gastroesophageal reflux disease)   . HOH (hard of hearing) left ear  . Hyperlipidemia   . Hypertension   . IBS (irritable bowel syndrome)   . Interstitial lung disease ([place])   . Pneumonia    04-2014, CAP  .  Stroke [facility])    no deficits, on plavix   Past Surgical History:  Procedure Laterality Date  . ABDOMINAL HYSTERECTOMY  1980  . BIOPSY BREAST    . BREAST LUMPECTOMY WITH RADIOACTIVE SEED AND SENTINEL LYMPH NODE BIOPSY Right 08/10/2015   Procedure: BREAST LUMPECTOMY WITH RADIOACTIVE SEED AND SENTINEL LYMPH NODE BIOPSY;  Surgeon: Autumn Messing  III, MD;  Location: [facility];  Service: General;  Laterality: Right;  . CHOLECYSTECTOMY  2011  . EP IMPLANTABLE DEVICE N/A 11/16/2015   Procedure: Loop Recorder Insertion;  Surgeon: [name], MD;  Location: [facility] CV LAB;  Service: Cardiovascular;  Laterality: N/A;  . eyes  2007   cararacts  . TEE WITHOUT CARDIOVERSION N/A 09/05/2014   Procedure: TRANSESOPHAGEAL ECHOCARDIOGRAM (TEE);  Surgeon: Lelon Perla, MD;  Location: [facility] ENDOSCOPY;  Service: Cardiovascular;  Laterality: N/A;  . TEE WITHOUT CARDIOVERSION N/A 11/16/2015   Procedure: TRANSESOPHAGEAL ECHOCARDIOGRAM (TEE);  Surgeon: Larey Dresser, MD;  Location: [facility];  Service: Cardiovascular;  Laterality: N/A;  . TONSILLECTOMY  1945    reports that she has never smoked. She has never used smokeless tobacco. She reports that she drinks alcohol. She reports that she does not use drugs. family history includes Heart disease (age of onset: 47) in her father; Hyperlipidemia in her father and mother; Hypertension in her mother; Lung cancer in her paternal uncle; Stroke in her father. Allergies  Allergen Reactions  . Latex Itching, Rash and Other (See Comments)    Red angry skin from latex tape  . Sulfonamide Derivatives Rash    hives  . Tape Itching and Rash    Please use paper tape or coban wrap!!     Review of Systems  Constitutional: Positive for fatigue. Negative for appetite change, chills and fever.  HENT: Negative for trouble swallowing.   Respiratory: Negative for cough and shortness of breath.   Cardiovascular: Negative for chest pain and palpitations.  Gastrointestinal: Negative for abdominal pain, diarrhea, nausea and vomiting.  Genitourinary: Negative for dysuria.  Musculoskeletal: Negative for neck stiffness.  Neurological: Positive for headaches. Negative for dizziness and syncope.  Psychiatric/Behavioral: Negative for dysphoric mood.       Objective:   Physical Exam   Constitutional: She is oriented to person, place, and time. She appears well-developed and well-nourished.  HENT:  Right Ear: External ear normal.  Left Ear: External ear normal.  Mouth/Throat: Oropharynx is clear and moist.  Patient has extensive bruising of her forehead and both sides of the face. She has approximately 1.5 cm hematoma of the forehead which is slightly tender but no signs of secondary infection. She has a laceration across the bridge of the nose which is crusted. No signs of secondary infection. 3 sutures in place No intranasal bleeding or visible hematoma  Neck: Neck supple.  Cardiovascular: Normal rate and regular rhythm.   Pulmonary/Chest: Effort normal and breath sounds normal. No respiratory distress. She has no wheezes. She has no rales.  Abdominal: Soft. There is no tenderness.  Musculoskeletal: She exhibits no edema.  Lymphadenopathy:    She has no cervical adenopathy.  Neurological: She is alert and oriented to person, place, and time. No cranial nerve deficit. Coordination normal.  She has somewhat general weakness throughout but no asymmetry in strength upper extremity is. She has slight weakness with left dorsiflexion compared to the right otherwise symmetric she is able to transfer from chair to table without assistance Is able to ambulate unassisted  Psychiatric: She has  a normal mood and affect. Her behavior is normal.       Assessment:     #1 recent CVA right corona radiata infarct status post loop recorder  #2 hypertension which is poorly controlled today and also by multiple recent readings  #3 hyperlipidemia  #4 essential tremor  #5 history of irritable bowel syndrome  #6 history of breast cancer  #7 history of interstitial lung disease followed by pulmonary  #8 recent fall with hematoma of the face along with facial laceration with no signs of secondary infection  #9 nondisplaced nasal fracture which was closed    Plan:     -Start  amlodipine 2.5 mg once daily and reassess blood pressure within 3 weeks -Recommend home blood pressure cuff and monitor and bring in readings for review -Recommend using cane or walker at all times at home especially when unassisted -Continue other current medications including Plavix and Lipitor -Prescription written for travel wheelchair -3 sutures removed from facial laceration today -discuss cognitive impairment issues in further detail at follow up.  Over 40 minutes spent of which > 50% in direct contact with patient reviewing recent events, evaluating recent studies, discussing acute and chronic care issues, reviewing medications, examining patient.    [name] MD [facility]

## 2015-12-20 NOTE — Progress Notes (Signed)
Pre visit review using our clinic review tool, if applicable. No additional management support is needed unless otherwise documented below in the visit note. 

## 2015-12-26 ENCOUNTER — Ambulatory Visit (INDEPENDENT_AMBULATORY_CARE_PROVIDER_SITE_OTHER): Payer: Medicare Other | Admitting: Pulmonary Disease

## 2015-12-26 ENCOUNTER — Other Ambulatory Visit (INDEPENDENT_AMBULATORY_CARE_PROVIDER_SITE_OTHER): Payer: Medicare Other

## 2015-12-26 ENCOUNTER — Encounter: Payer: Self-pay | Admitting: Pulmonary Disease

## 2015-12-26 VITALS — BP 120/86 | HR 77 | Ht 61.0 in | Wt 126.6 lb

## 2015-12-26 DIAGNOSIS — J84112 Idiopathic pulmonary fibrosis: Secondary | ICD-10-CM

## 2015-12-26 LAB — HEPATIC FUNCTION PANEL
ALK PHOS: 73 U/L (ref 39–117)
ALT: 14 U/L (ref 0–35)
AST: 22 U/L (ref 0–37)
Albumin: 3.9 g/dL (ref 3.5–5.2)
BILIRUBIN DIRECT: 0 mg/dL (ref 0.0–0.3)
Total Bilirubin: 0.6 mg/dL (ref 0.2–1.2)
Total Protein: 7.8 g/dL (ref 6.0–8.3)

## 2015-12-26 NOTE — Progress Notes (Signed)
   Subjective:    Patient ID: Carmen Duncan, female    DOB: 09-Nov-1940, 75 y.o.   MRN: [id number]  HPI  75yo never smoker with asthma, HTNfor follow-up of interstitial lung disease  Her son [name] works for [employer] 03/2014 Seen for pulmonary consult during hospitalization for CAP for non-resolving infiltrates &  dyspnea on exertion for several months  history of Raynaud phenomenon She was treated with IV steroids and discharged on a slow prednisone taper x 4 months and Stopped by end of March 2016  12/26/2015  Chief Complaint  Patient presents with  . Follow-up    doing well, [name] makes her nauseated, right side pain off and on.     28mFU She has had a rough year  Unfortunately she was diagnosed with breast cancer, underwent lumpectomy and radiation She then had an acute CVA-no residual weakness Then a fall when she went to get the newspaper with a nasal fracture She now tablets with a walker  She does not feel that her dyspnea is worse, no pedal edema cough or chest colds   started on [name] about 10 days now, complains of mild nausea which passes within a few minutes   On last visit ,Desaturates to 91% on walking but recovers on resting  Significant tests/ events  CXRs reviewed- bibasal infiltrates  are new when compared to old chest x-ray from 2011. 03/2014 HRCT scan -showed peripheral patchy consolidation bilaterally-infection versus COP  Swallow eval neg for aspiration  03/2014 echo >> grade 2 diastolic dysfunction with fluid overload and elevated BNP  09/2014 HRCT chest - resolution of aspdz  Peripheral and basilar predominant pattern of subpleural reticulation, ground-glass, traction bronchiolectasis   08/2015 HRCT -worse honeycombing  Labs 08/2014  showed ESR at 23, Hypsensiivity panel neg ,  ANA positive but titer neg , CCP neg, hypersens panel neg .   PFT 09/2014 showed mild to mod restriction FVC at 60%,  FEV1 at 64%, ratio 79,  with low DLCO at  50%   Review of Systems neg for any significant sore throat, dysphagia, itching, sneezing, nasal congestion or excess/ purulent secretions, fever, chills, sweats, unintended wt loss, pleuritic or exertional cp, hempoptysis, orthopnea pnd or change in chronic leg swelling. Also denies presyncope, palpitations, heartburn, abdominal pain, nausea, vomiting, diarrhea or change in bowel or urinary habits, dysuria,hematuria, rash, arthralgias, visual complaints, headache, numbness weakness or ataxia.     Objective:   Physical Exam  Gen. Pleasant, well-nourished, in no distress ENT -  Bruising forehead, no post nasal drip Neck: No JVD, no thyromegaly, no carotid bruits Lungs: no use of accessory muscles, no dullness to percussion, Bibasal rales, no rhonchi  Cardiovascular: Rhythm regular, heart sounds  normal, no murmurs or gallops, no peripheral edema Musculoskeletal: No deformities, no cyanosis or clubbing         Assessment & Plan:

## 2015-12-26 NOTE — Patient Instructions (Signed)
Check liver function tests today and every month -September/October  -Then every 3 months thereafter  Call if nausea gets worse

## 2015-12-26 NOTE — Assessment & Plan Note (Signed)
Appears to be tolerating well Check liver function tests today and every month -September/October  -Then every 3 months thereafter  Call if nausea gets worse

## 2016-01-01 ENCOUNTER — Telehealth: Payer: Self-pay | Admitting: Family Medicine

## 2016-01-01 NOTE — Telephone Encounter (Signed)
[facility]  [place]    --------------------------------------------------------------------------------   Patient Name: Carmen Duncan  Gender: Female  DOB: 1940/12/05   Age: 75 Y 45 M 23 D  Return Phone Number: [phone] (Primary), [phone] (Secondary)  Address:     City/State/Zip: [place]  [address]   Client [facility] - Client  Client Site [facility] - Day  Physician Carolann Littler - MD  Contact Type Call  Who Is Calling Patient / Member / Family / Caregiver  Call Type Triage / Clinical  Relationship To Patient Self  Return Phone Number [phone] (Primary)  Chief Complaint Itching  Reason for Call Symptomatic / Request for [place] states is taken a new medication called [name], has a vaginal infection and is itching.   Appointment Disposition EMR Appointment Attempted - Not Scheduled  Info pasted into Epic Yes  PreDisposition Call Doctor  Translation No       Nurse Assessment  Nurse: Amalia Hailey, RN, Melissa Date/Time (Eastern Time): 01/01/2016 4:21:00 PM  Confirm and document reason for call. If symptomatic, describe symptoms. You must click the next button to save text entered. ---Caller states is taken a new medication called [name], has a vaginal infection and is itching.    Has the patient traveled out of the country within the last 30 days? ---Not Applicable    Does the patient have any new or worsening symptoms? ---Yes    Will a triage be completed? ---Yes    Related visit to physician within the last 2 weeks? ---Yes    Does the PT have any chronic conditions? (i.e. diabetes, asthma, etc.) ---Yes    List chronic conditions. ---idiopathic pulmonary fibrosis , stitches out of nose-was taking antibiotic    Is this a behavioral health or substance abuse call? ---No           Guidelines           Guideline Title Affirmed Question Affirmed Notes Nurse Date/Time (Eastern Time)  Vaginal Symptoms MODERATE-SEVERE itching (i.e., interferes with school, work, or sleep)    Amalia Hailey, RN, Melissa 01/01/2016 4:23:14 PM    Disp. Time Eilene Ghazi Time) Disposition Final User    01/01/2016 4:12:09 PM Send To Clinical Follow Up Babette Relic, RN, Helene Kelp      01/01/2016 4:30:36 PM See Physician within 24 Hours Yes Amalia Hailey, RN, Lenna Sciara            Caller Understands: Yes  Disagree/Comply: Comply       Care Advice Given Per Guideline        SEE PHYSICIAN WITHIN 24 HOURS: CLEANSING: Wash the area once thoroughly with un-scented soap and water to remove any irritants. ANTIHISTAMINE FOR SEVERE ITCHING: * Take an antihistamine by mouth to reduce the itching. Diphenhydramine (OTC Benadryl) is a good choice. Adult dose is 25-50 mg. Take it up to 4 times a day. DO NOT: * DO NOT DOUCHE (Reason: douching does not help vaginitis, and may actually make it worse). * DO NOT USE ANY VAGINAL CREAMS during the 24 hours before your physician appointment (Reason: interferes with examination). * You become worse. CALL BACK IF:        --------------------------------------------------------------------------------         Comments  User: Colin Ina, RN Date/Time (Eastern Time): 01/01/2016 4:33:40 PM  Caller reports she does not want any other practitioner in the practice  to see her for appt. insists it must be Dr. Elease Hashimoto and asking if she could be worked in to the schedule to be seen. Caller advised Dr. Elease Hashimoto does not have any appts openings for tomorrow. Caller verb. understood.    Referrals  REFERRED TO PCP OFFICE

## 2016-01-01 NOTE — Telephone Encounter (Signed)
Pt is having vaginal itch and would like to have something called in pt is not sure if it is her medication that is giving her the itch of what it is.  Transferred pt to [facility].

## 2016-01-02 NOTE — Telephone Encounter (Signed)
Another message created on pt and sent to Dr. Elease Hashimoto.

## 2016-01-02 NOTE — Telephone Encounter (Signed)
Noted  

## 2016-01-02 NOTE — Telephone Encounter (Signed)
Called pt and son answered and he will call back later to make an appointment.

## 2016-01-02 NOTE — Telephone Encounter (Signed)
I called the pt and informed her of the message below and she agreed to try Monistat or Gyne-lotrimin and will call back if needed.

## 2016-01-02 NOTE — Telephone Encounter (Signed)
Very difficult to treat without evaluation- could have atrophic vaginitis, yeast vaginitis, vs other. Could try Monistat or Gyne-lotrimin    If not improved with that, needs to be seen.

## 2016-01-02 NOTE — Telephone Encounter (Signed)
Called pts home phone which was her Son's number. He was unaware that she had even called [place]. He said that her dementia has worsened. I did call her, c/o a 3 week HX of vaginal itching. Denies any dysuria, frequency, urgency or vaginal discharge. Pt said she recently started [name] 100mg  1qd and feels this is causing her itching. Pt's son said that they are not sure she can come in this week. Any recommendations over the phone?

## 2016-01-11 ENCOUNTER — Telehealth: Payer: Self-pay | Admitting: *Deleted

## 2016-01-11 ENCOUNTER — Ambulatory Visit (HOSPITAL_BASED_OUTPATIENT_CLINIC_OR_DEPARTMENT_OTHER): Payer: Medicare Other | Admitting: Adult Health

## 2016-01-11 ENCOUNTER — Encounter: Payer: Self-pay | Admitting: Adult Health

## 2016-01-11 VITALS — BP 142/82 | HR 70 | Temp 97.5°F | Resp 18 | Ht 61.0 in | Wt 126.1 lb

## 2016-01-11 DIAGNOSIS — Z171 Estrogen receptor negative status [ER-]: Secondary | ICD-10-CM

## 2016-01-11 DIAGNOSIS — Z86000 Personal history of in-situ neoplasm of breast: Secondary | ICD-10-CM | POA: Diagnosis not present

## 2016-01-11 DIAGNOSIS — I639 Cerebral infarction, unspecified: Secondary | ICD-10-CM

## 2016-01-11 DIAGNOSIS — C50411 Malignant neoplasm of upper-outer quadrant of right female breast: Secondary | ICD-10-CM

## 2016-01-11 NOTE — Telephone Encounter (Signed)
Called pt's son, [name] to give him appt dates and times for upcoming appt with Dr. Marlou Starks on Sept 21 @ 2:50p for 6 mos f/u and appt for annual mammo on Jul 08, 2016 at 11:00a at [facility]. I have called pt to make her aware of these appts as well. Message to be fwd to [employer].

## 2016-01-11 NOTE — Progress Notes (Signed)
CLINIC:  Survivorship   REASON FOR VISIT:  Routine follow-up post-treatment for a recent history of breast cancer.  BRIEF ONCOLOGIC HISTORY:    Breast cancer of upper-outer quadrant of right female breast ([place])   07/17/2015 Initial Biopsy    (R) breast needle biopsy: DCIS with calcs, high grade. ER-/PR-     07/19/2015 Initial Diagnosis    Breast cancer of upper-outer quadrant of right female breast ([place])     08/10/2015 Surgery    (R) lumpectomy with SLNB Marlou Starks): DCIS with comedonecrosis & calcs, grade 3, spanning 1 cm. Margins neg. 0/2 SLN. Prognostic panel repeated and ER/PR remain negative.   pTis,pN0: Stage 0     09/28/2015 - 10/26/2015 Radiation Therapy    Adj XRT Lisbeth Renshaw). Right breast: 42.5 Gy in 17 fractions. Right breast boost: 7.5 Gy in 3 fractions. Total 50 Gy      INTERVAL HISTORY:  Ms. Teng presents to the [facility] today for our initial meeting to review her survivorship care plan detailing her treatment course for breast cancer, as well as monitoring long-term side effects of that treatment, education regarding health maintenance, screening, and overall wellness and health promotion.     Overall, Ms. Lukose reports feeling quite well since completing her radiation therapy approximately 3 months ago.  She is here today with her son, [name].  From a breast cancer standpoint, she feels like she is doing very well.  She is continuing to recover from radiation.  Unfortunately, she did experience a stroke shortly after completing radiation in 10/2015, which has made her extremely fatigued and weak as well.  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   Her appetite is good. She denies any pain.  She tells me that her skin has healed well from the radiation.  Her son has questions about her follow-up schedule  and we will review those recommendations today.    REVIEW OF SYSTEMS:  Review of Systems  Constitutional: Positive for malaise/fatigue.  HENT: Negative.   Eyes: Negative.   Respiratory: Positive for shortness of breath.        DOE  Cardiovascular: Negative.   Gastrointestinal: Negative.   Genitourinary: Negative.   Skin: Negative.   Neurological: Positive for weakness.       Recent stroke 10/2015  Endo/Heme/Allergies:       Endorses some numbness in hands; thinks it may be Reynaud's.  Psychiatric/Behavioral: Negative.   GU: Denies vaginal bleeding, discharge, or dryness.  Breast: Denies any new nodularity, masses, tenderness, nipple changes, or nipple discharge.    A 14-point review of systems was completed and was negative, except as noted above.   ONCOLOGY TREATMENT TEAM:  1. Surgeon:  Dr. Marlou Starks at [facility] 2. Medical Oncologist: Dr. Jana Hakim 3. Radiation Oncologist: Dr. Lisbeth Renshaw    PAST MEDICAL/SURGICAL HISTORY:  Past Medical History:  Diagnosis Date  . Acoustic neuroma ([place])   . Anxiety   . Arthritis   . Asthma   . Brain cancer ([place])   . Breast cancer of upper-outer quadrant of right female breast ([place]) 07/19/2015  . Diverticulosis   . Ejection fraction   . Essential tremor    on inderal  . GERD (gastroesophageal reflux disease)   . HOH (hard of hearing) left ear  . Hyperlipidemia   . Hypertension   . IBS (irritable bowel  syndrome)   . Interstitial lung disease ([place])   . Pneumonia    04-2014, CAP  . Stroke [facility])    no deficits, on plavix   Past Surgical History:  Procedure Laterality Date  . ABDOMINAL HYSTERECTOMY  1980  . BIOPSY BREAST    . BREAST LUMPECTOMY WITH RADIOACTIVE SEED AND SENTINEL LYMPH NODE BIOPSY Right 08/10/2015   Procedure: BREAST LUMPECTOMY WITH RADIOACTIVE SEED AND SENTINEL LYMPH NODE BIOPSY;  Surgeon: Autumn Messing III, MD;  Location: [place];  Service: General;  Laterality: Right;  . CHOLECYSTECTOMY  2011    . EP IMPLANTABLE DEVICE N/A 11/16/2015   Procedure: Loop Recorder Insertion;  Surgeon: [name], MD;  Location: [place] CV LAB;  Service: Cardiovascular;  Laterality: N/A;  . eyes  2007   cararacts  . TEE WITHOUT CARDIOVERSION N/A 09/05/2014   Procedure: TRANSESOPHAGEAL ECHOCARDIOGRAM (TEE);  Surgeon: Lelon Perla, MD;  Location: [facility] ENDOSCOPY;  Service: Cardiovascular;  Laterality: N/A;  . TEE WITHOUT CARDIOVERSION N/A 11/16/2015   Procedure: TRANSESOPHAGEAL ECHOCARDIOGRAM (TEE);  Surgeon: Larey Dresser, MD;  Location: [facility] ENDOSCOPY;  Service: Cardiovascular;  Laterality: N/A;  . TONSILLECTOMY  1945     ALLERGIES:  Allergies  Allergen Reactions  . Latex Itching, Rash and Other (See Comments)    Red angry skin from latex tape  . Sulfonamide Derivatives Rash    hives  . Tape Itching and Rash    Please use paper tape or coban wrap!!     CURRENT MEDICATIONS:  Outpatient Encounter Prescriptions as of 01/11/2016  Medication Sig  . acetaminophen (TYLENOL) 325 MG tablet Take 325 mg by mouth every 6 (six) hours as needed for headache.   [name] amLODipine (NORVASC) 2.5 MG tablet Take 1 tablet (2.5 mg total) by mouth daily.  [name] atorvastatin (LIPITOR) 20 MG tablet Take 1 tablet (20 mg total) by mouth daily.  . clidinium-chlordiazePOXIDE (LIBRAX) 5-2.5 MG capsule Take 1 capsule by mouth 2 (two) times daily as needed. (Patient taking differently: Take 1 capsule by mouth at bedtime. )  . clopidogrel (PLAVIX) 75 MG tablet Take 1 tablet (75 mg total) by mouth daily.  . Nintedanib ([name]) 100 MG CAPS Take 100 mg by mouth 2 (two) times daily.  . pantoprazole (PROTONIX) 40 MG tablet Take 1 tablet (40 mg total) by mouth daily.  . propranolol (INDERAL) 20 MG tablet Take 1 tablet (20 mg total) by mouth daily. (Patient taking differently: Take 20 mg by mouth daily. Pt takes for BP and tremors. Do not d/c without neuro consult.)   No facility-administered encounter medications on file as of 01/11/2016.       ONCOLOGIC FAMILY HISTORY:  Family History  Problem Relation Age of Onset  . Hyperlipidemia Mother   . Hypertension Mother   . Hyperlipidemia Father   . Heart disease Father 72  . Stroke Father   . Lung cancer Paternal Uncle     x 4     GENETIC COUNSELING/TESTING: None  SOCIAL HISTORY:  BRAYLI BEZNER is here today with her son, [name]. Ms. See is widowed and lives in [place], [place] with her son.  She is retired; previously worked as a Network engineer.  She denies any current or history of tobacco or illicit drug use.  She drinks alcohol socially.    PHYSICAL EXAMINATION:  Vital Signs:   Vitals:   01/11/16 1035  BP: (!) 142/82  Pulse: 70  Resp: 18  Temp: 97.5 F (36.4 C)   Filed Weights  01/11/16 1035  Weight: 126 lb 1.6 oz (57.2 kg)   General: Elderly female in no acute distress.  She is accompanied by her son today.  HEENT: Head is normocephalic.  Pupils equal and reactive to light and accomodation. Conjunctivae clear without exudate.  Sclerae anicteric. Oral mucosa is pink, moist.  Oropharynx is pink without lesions or erythema.  Lymph: No cervical, supraclavicular, or infraclavicular lymphadenopathy noted on palpation.  Cardiovascular: Regular rate and rhythm.[name] Respiratory: Clear to auscultation bilaterally. Chest expansion symmetric; breathing non-labored.  GI: Abdomen soft and round; non-tender, non-distended. Bowel sounds normoactive.  GU: Deferred.  Neuro: No focal deficits. Gait slow, but steady.   Psych: Mood and affect normal and appropriate for situation.  Extremities: No edema. Skin: Warm and dry.  LABORATORY DATA:  None for this visit.  DIAGNOSTIC IMAGING:  None for this visit.      ASSESSMENT AND PLAN:  Ms.. Loeffler is a pleasant 75 y.o. female with Stage 0 right breast DCIS, ER-/PR-, diagnosed in 06/2015, treated with lumpectomy and adjuvant radiation therapy.  She presents to the Survivorship Clinic for our initial meeting and routine follow-up  post-completion of treatment for breast cancer.    1. Stage 0 right breast cancer:  Ms. Walker is continuing to recover from definitive treatment for breast cancer. She will follow-up with her medical oncologist, Dr. Jana Hakim in 10/2016 with history and physical exam per surveillance protocol.  I explained the rationale for her continued follow-up with Dr. Ethlyn Gallery office.  We will facilitate an upcoming appointment for her to see him within the next few months.  She will be due for her annual mammogram in 06/2016; orders placed.  Today, a comprehensive survivorship care plan and treatment summary was reviewed with the patient today detailing her breast cancer diagnosis, treatment course, potential late/long-term effects of treatment, appropriate follow-up care with recommendations for the future, and patient education resources.  A copy of this summary, along with a letter will be sent to the patient's primary care provider via mail/fax/In Basket message after today's visit.    2. Recent stroke: Maintain follow-up with her neurologist. Continue current treatment regimen.   3. Bone health:  Given Ms. Seidenberg's age and history of breast cancer, she is at risk for bone demineralization.  We do not have records of a DEXA scan. However, given that she will not be on anti-estrogen therapy, I will defer any future DEXA imaging to her PCP.  In the meantime, she was encouraged to increase her consumption of foods rich in calcium, as well as increase her weight-bearing activities.  She was given education on specific activities to promote bone health.  4. Cancer screening:  Due to Ms. Payment's history and her age, she should receive screening for skin cancers, colon cancer, and gynecologic cancers.  The information and recommendations are listed on the patient's comprehensive care plan/treatment summary and were reviewed in detail with the patient.    5. Health maintenance and wellness promotion: Ms. Doehring was encouraged  to consume 5-7 servings of fruits and vegetables per day. We reviewed the "Nutrition Rainbow" handout, as well as the handout "Recommendations for Nutrition & Physical Activity" from the [place].  She was also encouraged to engage in moderate to vigorous exercise for 30 minutes per day most days of the week. We discussed the [employer] fitness program, which is designed for cancer survivors to help them become more physically fit after cancer treatments.  She was instructed to limit her alcohol consumption  and continue to abstain from tobacco use.  6. Support services/counseling: It is not uncommon for this period of the patient's cancer care trajectory to be one of many emotions and stressors.  We discussed an opportunity for her to participate in the next session of [facility] ("Finding Your New Normal") support group series designed for patients after they have completed treatment.   Ms. Havens was encouraged to take advantage of our many other support services programs, support groups, and/or counseling in coping with her new life as a cancer survivor after completing anti-cancer treatment.  She was offered support today through active listening and expressive supportive counseling.  She was given information regarding our available services and encouraged to contact me with any questions or for help enrolling in any of our support group/programs.    Dispo:   -She will see Dr. Marlou Starks sometime later in the year; we will call his office to get this scheduled for the patient.  -Mammogram due in 06/2016; orders placed today  -Return to cancer center to see Dr. Jana Hakim in 10/2016  -She is welcome to return back to the Survivorship Clinic at any time; no additional follow-up needed at this time.  -Consider referral back to survivorship as a long-term survivor for continued surveillance  A total of 40 minutes of face-to-face time was spent with this patient with greater than 50% of that time in  counseling and care-coordination.   Mike Craze, NP Survivorship Program [place] [phone]   Note: PRIMARY CARE PROVIDER [name], [place] [phone]

## 2016-01-12 LAB — CUP PACEART REMOTE DEVICE CHECK: Date Time Interrogation Session: 20170722210849

## 2016-01-13 NOTE — Progress Notes (Signed)
Subjective:   Carmen Duncan was seen in consultation in the movement disorder clinic at the request of [name], MD.  The evaluation is for tremor.  The patient is a 75 y.o. R handed female with a long hx of tremor.   The patient states that the tremor is in the L hand.  She notes it with activation.  If pouring something from a saucepan, it will start to tremor.  It seems to be worse in specific positions.  She has no trouble with putting on makeup or eating as this only affects her nondominant hand.  She has no trouble cutting food.  She has no vocal tremor, head tremor or RUE tremor.  She has no leg tremor.  Sometimes she feels like she is "walking like a drunk" because she is not stable.  She does not feel comfortable near the edge of a pier b/c she feels like she would fall off the edge.  She generally does not fall.  She did fall Jul 16, 2011 because she missed the last 3 steps at her home when she was coming down in a hurry.  She bruised her entire R leg and it is still very sore.    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  I do not have those records.  She saw Dr. [name].  She is on propranolol and she has been on it 7-8 years.  She is on 40 mg twice per day.  It does help.  She states that she was told that her propranolol could not be increased.  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  She sees a Publishing rights manager at [facility] and was told it was nothing to worry about.  She has it monitored q 3 years.  Her last MRI brain was done in [place].   Because of this, she will have intermittent numbness of the L face.    There was a fam hx of tremor in her sister and it is starting in her son, who is 24 years old.  Current/Previously tried tremor medications: propranolol, primidone  Current medications that may exacerbate tremor:  N/a  12/03/12:  She was started on primidone last visit.  She is only taking 1/2 of  a 50 mg tablet at night.  She didn't go up to a full tablet because she did not like the way that it made her feel.  She wants to d/c the medication.  She is on propranolol but the dose cannot be increased because her pulse is already on the low end of normal.  She is having some intermittent paresthesias in her fingertips, right more than left.  03/12/13:  Topamax was added last visit, which has helped but has decreased appetite.  She tremors much less now but notices it when stressed, fatigued or under pressure.  She c/o constipation with the topamax.  She has taste aversion with the medication.  She has some paresthesias with the medication.  She remains on propranolol but is off of the primidone.  She thinks that the SE are worth continuing to take the medication.  She has had worsening of vision but thinks that she had that prior to the topamax and has an eye appt Nov 4.  She worked on last visit.  B12 was reported.  Serum protein electrophoresis did not reveal M spike.  RPR was negative.  Urinary protein electrophoresis  showed some free kappa light chains but there was no monoclonal protein, no M spike.  RPR was negative.  07/28/13 update:  The pt was on topamax 200 mg daily.  She was unable to tolerate bid dosing of the medication.  She ended up going back to 100 mg at night and it makes her sleepy at night when she takes the medication but no hang over effect.  She still has some intermittent paresthesias.    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  She does c/o dull fronal headaches, daily for the last month.  She uses sinus rinse and it helps.  States that tremor has been stable.    02/03/14 update:  The patient has a history of essential tremor, and is currently on propranolol 40 mg once a day as well as Topamax 100 mg daily.   Unless she is very tired or upset, she  does well in regards to tremor.   She received notes from [name] at [facility] since last visit.  He just said that her schwannoma was stable in size and she just needs a repeat MRI for 10 years that they will plan on doing.  She did have a repeat urinary protein electrophoresis since our last visit that was unremarkable.  I had the opportunity to review notes from her primary care physician from 01/20/2014.  There were 2 brief episodes that the patient describes in which she was walking and her legs felt weak.  She felt near syncopal.  She was not dizzy but just stated that "I couldn't have moved if I had to."    It only lasted for a few minutes.  She had already decreased the propranolol on her own when these episodes happened.  She did that primarily because she would forget to take it bid but also because of fatigue.    08/04/14 update:  The patient returns today for follow-up.  Much has happened since her last visit.  Medical records were reviewed.  She has a history of essential tremor and was on propranolol 40 mg, but that has since been reduced to what medical records said was 10 mg daily but pt states is 5 mg daily because of other medical issues.  She remains on Topamax 100 mg daily.  In November, 2015, she went to her primary care physician complaining about malaise and she had a nuclear stress test that was a low risk study with a left ventricular ejection fraction of 83%.  She saw cardiology not long thereafter and ended up having a chest x-ray demonstrating bibasilar infiltrates.  A few days later she developed a fever and was treated for community-acquired pneumonia.  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  During that hospitalization, her propranolol was decreased from 40 mg daily to 10 mg daily because of hypotension.  She became more weak during that hospitalization and was discharged to a subacute rehabilitation facility and ultimately left that rehabilitation  facility and was discharged home on December 23.  She does think that tremor may have increased slightly.  08/30/14 update:  The patient is following up today at the request of her ophthalmologist.  Pt states that she just went for a "normal checkup" because her vision had changed a little and "I needed new frames."  She states that her eye doctor thought that  there was a stroke behind the left eye and that led to an MRI of the brain and MRA of the brain on 08/23/2014.  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  There was an enhancing lesion in the right parietal region as well.  I talked on the phone with the radiologist about this lesion and he felt that this was likely a subacute vascular insult and not a metastatic lesion.  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  She had a carotid ultrasound on 07/14/2014 demonstrating 1-39% stenosis bilaterally.  She did an echocardiogram last year when she was in the hospital on 04/20/2014 demonstrating an ejection fraction of 65-70%, moderate diastolic dysfunction and moderate pulmonary HTN  02/06/15 update:  The patient returns today for follow-up.  She is on Topamax, 100 mg daily and propranolol, 10 mg daily for essential tremor.  At our last visit, I had the patient discontinue her aspirin and we changed it to Plavix.  She is doing well on that.  No melena/hematochezia.  No falls.  She had a TEE that was negative for thrombus and demonstrated normal LV function.  She was supposed to wear an event monitor, and she did, but compliance was very limited and she only wore it for part of a few days, so there was limited data.  There was no arrhythmia when she did wear it.  She had a repeat MRI of the brain on 12/16/2014.  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  On prior MRAs this is consistent with atherosclerotic changes.   I have reviewed primary care records since our last visit.  She has been losing weight over the last several months and her primary care physician asked me if it was from the Topamax.  She has had a fairly extensive negative workup.  Although I doubt it given the amount of time that she has been on Topamax, I told him that she could discontinue it.  They opted to leave her on it for now and take a wait and see approach.  She states that she has no appetite but she is forcing herself to eat.    06/08/15 update:  The patient is following up today for her essential tremor.  Last visit I discontinued her Topamax because of weight loss and memory concerns.  She reports that she does well until she gets really tired and then she will have tremor of the L hand and have trouble pouring things.  She remains on propranolol, 10 mg daily for tremor.  I reviewed records since our last visit.  Her B12 level was very low at 177 and the patient admits she was not being compliant with taking her B12.  She is now on monthly injections.  Her B12 was rechecked yesterday and it was 877.  She is doing well on her Plavix and has had no new focal or lateralizing neuro sx's.  No bleeding.    10/26/15 update:  The patient follows up today regarding essential tremor.  She is on propranolol, 20 mg daily, which was increased last visit.  She states that she is doing "okay" - notices it in the L hand when tired,  mad or angry.  She also has a history of a cerebral infarction.  She is on Plavix.  She is on Lipitor, but when her last fasting lipids were checked in June, 2016 her LDL was 101.  I reviewed records since last visit.  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  Off of b12 injections and forgetting to take pills.     01/16/16 update.  The records that were made available to me were reviewed.  This patient is accompanied in the office by her son and daughter in law who supplements the history. Much has happened since our last visit.  In terms of tremor she is doing well and remains on propranolol 20 mg daily.  She went to the hospital on 11/14/15 after a fall with L facial droop, speech change, L sided weakness and ultimately found to have a 1cm DWI positive infarct in the R corona radiata and 1cm DWI positive infarct in the R periventricular WM.  I reviewed these films.  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  Carotid u/s was negative for hemo signficant stenosis.  TEE completed which was negative, EF 55-60%.  LDL was 64.  I had increased her lipitor last visit because LDL was 101 and goal was <70.  She had a loop recorder inserted on 6/22 and then went to rehab from 6/23-7/3.  She then went back to the ED on 7/17 after falling face first in her garage and had a nasal fx.  No surgery needed. She did f/u with [name], MD and her BP was elevated and he started her on amlodipine and removed her stitches.  Today, she states that her strength is back to normal except first thing in the AM she feels weak.  She is living with her family.  Memory has been an issue.  She has trouble remembering why she was in the hospital.  She is having to ask questions multiple times.  Son has concern about meds so he got pill box and that has helped.  Memory was a concern prior to the stroke but got worse after it.  Family states that it probably started in fall 2015 but most noticeable over this year.  Some trouble doing personal finances per family but pt denies this.  Resumed driving this week after son watched her.        Outside reports reviewed: historical medical records.  Allergies  Allergen Reactions  . Latex Itching, Rash and Other (See Comments)    Red angry skin from latex tape  . Sulfonamide Derivatives Rash     hives  . Tape Itching and Rash    Please use paper tape or coban wrap!!    Current Outpatient Prescriptions on File Prior to Visit  Medication Sig Dispense Refill  . acetaminophen (TYLENOL) 325 MG tablet Take 325 mg by mouth every 6 (six) hours as needed for headache.     [name] amLODipine (NORVASC) 2.5 MG tablet Take 1 tablet (2.5 mg total) by mouth daily. 30 tablet 5  . atorvastatin (LIPITOR) 20 MG tablet Take 1 tablet (20 mg total) by mouth daily. 30 tablet 11  . clidinium-chlordiazePOXIDE (LIBRAX) 5-2.5 MG capsule Take 1 capsule by mouth 2 (two) times daily as needed. (Patient taking differently: Take 1 capsule by mouth at bedtime. ) 60 capsule 3  . clopidogrel (PLAVIX) 75 MG tablet Take 1 tablet (75 mg total) by mouth daily.  30 tablet 5  . Nintedanib ([name]) 100 MG CAPS Take 100 mg by mouth 2 (two) times daily.    . pantoprazole (PROTONIX) 40 MG tablet Take 1 tablet (40 mg total) by mouth daily. 30 tablet 5  . propranolol (INDERAL) 20 MG tablet Take 1 tablet (20 mg total) by mouth daily. (Patient taking differently: Take 20 mg by mouth daily. Pt takes for BP and tremors. Do not d/c without neuro consult.) 30 tablet 6   No current facility-administered medications on file prior to visit.     Past Medical History:  Diagnosis Date  . Acoustic neuroma ([place])   . Anxiety   . Arthritis   . Asthma   . Brain cancer ([place])   . Breast cancer of upper-outer quadrant of right female breast ([place]) 07/19/2015  . Diverticulosis   . Ejection fraction   . Essential tremor    on inderal  . GERD (gastroesophageal reflux disease)   . HOH (hard of hearing) left ear  . Hyperlipidemia   . Hypertension   . IBS (irritable bowel syndrome)   . Interstitial lung disease ([place])   . Pneumonia    04-2014, CAP  . Stroke [facility])    no deficits, on plavix    Past Surgical History:  Procedure Laterality Date  . ABDOMINAL HYSTERECTOMY  1980  . BIOPSY BREAST    . BREAST LUMPECTOMY WITH RADIOACTIVE SEED AND  SENTINEL LYMPH NODE BIOPSY Right 08/10/2015   Procedure: BREAST LUMPECTOMY WITH RADIOACTIVE SEED AND SENTINEL LYMPH NODE BIOPSY;  Surgeon: Autumn Messing III, MD;  Location: [place];  Service: General;  Laterality: Right;  . CHOLECYSTECTOMY  2011  . EP IMPLANTABLE DEVICE N/A 11/16/2015   Procedure: Loop Recorder Insertion;  Surgeon: [name], MD;  Location: [place] CV LAB;  Service: Cardiovascular;  Laterality: N/A;  . eyes  2007   cararacts  . TEE WITHOUT CARDIOVERSION N/A 09/05/2014   Procedure: TRANSESOPHAGEAL ECHOCARDIOGRAM (TEE);  Surgeon: Lelon Perla, MD;  Location: [facility] ENDOSCOPY;  Service: Cardiovascular;  Laterality: N/A;  . TEE WITHOUT CARDIOVERSION N/A 11/16/2015   Procedure: TRANSESOPHAGEAL ECHOCARDIOGRAM (TEE);  Surgeon: Larey Dresser, MD;  Location: [place];  Service: Cardiovascular;  Laterality: N/A;  . TONSILLECTOMY  1945    Social History   Social History  . Marital status: Widowed    Spouse name: N/A  . Number of children: N/A  . Years of education: N/A   Occupational History  . Not on file.   Social History Main Topics  . Smoking status: Never Smoker  . Smokeless tobacco: Never Used  . Alcohol use 0.0 oz/week     Comment: wine about 2 times a week   . Drug use: No  . Sexual activity: Yes    Partners: Female   Other Topics Concern  . Not on file   Social History Narrative  . No narrative on file    Family Status  Relation Status  . Mother Deceased   acute leukemia  . Father Deceased   MI, CVA  . Sister Alive   tremor  . Child Alive   son, mild tremor  . Paternal Uncle     Review of Systems A complete 10 system ROS was obtained and was negative apart from what is mentioned.   Objective:   VITALS:   Vitals:   01/16/16 1129  BP: 110/78  BP Location: Left Arm  Patient Position: Sitting  Cuff Size: Normal  Pulse: 75  Weight: 125  lb (56.7 kg)  Height: 5\' 1"  (1.549 m)   Wt Readings from Last 3 Encounters:   01/16/16 125 lb (56.7 kg)  01/11/16 126 lb 1.6 oz (57.2 kg)  12/26/15 126 lb 9.6 oz (57.4 kg)   Gen:  Appears stated age and in NAD. HEENT:  Normocephalic, atraumatic. The mucous membranes are moist. The superficial temporal arteries are without ropiness or tenderness. Cardiovascular: Regular rate and rhythm. Lungs: Clear to auscultation bilaterally. Neck: There are no carotid bruits noted bilaterally.  NEUROLOGICAL:  Orientation:  The patient is able to correctly identify the month and year, but misses the date by one.  She scores a 3/4 on the clock drawing.  The hands are not even close to drawn correctly.  She is able to she asks her son several repetitive questions. Cranial nerves: There is good facial symmetry.  There is pseudoptosis from lid lag bilaterally.    Extraocular muscles are intact and visual fields are full to confrontational testing. Speech is fluent and clear. Soft palate rises symmetrically and there is no tongue deviation. Hearing is intact to conversational tone. Tone: Tone is good throughout.  There is no rigidity. Sensation: Sensation is intact to light touch throughout Coordination:  The patient has no dysdiadichokinesia or dysmetria.  No asymmetries. Motor: Strength is 5/5 in the right upper and lower extremities.  Strength is 5/5 in the left upper extremity, with the exception of the intrinsic muscles of the hand and strength is 4+/5 there are and strength is 4/5 in the left leg.  Shoulder shrug is equal bilaterally.  There is no pronator drift.  There are no fasciculations noted. Gait and Station: The patient drags the left leg when walking and becomes somewhat off balance when she turns.  MOVEMENT EXAM: Tremor:  There is mild tremor of L hand.  Becomes more evident when elbow bent and when given something of weight to hold.  Has intention tremor as well.   LABS  Lab Results  Component Value Date   WBC 8.0 12/11/2015   HGB 14.7 12/11/2015   HCT 44.2  12/11/2015   MCV 91.7 12/11/2015   PLT 212 12/11/2015     Chemistry      Component Value Date/Time   NA 140 12/11/2015 1152   NA 139 07/26/2015 1251   K 3.9 12/11/2015 1152   K 4.8 07/26/2015 1251   CL 107 12/11/2015 1152   CL 103 05/03/2014 0630   CO2 28 12/11/2015 1152   CO2 28 07/26/2015 1251   BUN 6 12/11/2015 1152   BUN 13.3 07/26/2015 1251   CREATININE 0.62 12/11/2015 1152   CREATININE 0.8 07/26/2015 1251      Component Value Date/Time   CALCIUM 9.2 12/11/2015 1152   CALCIUM 9.4 07/26/2015 1251   ALKPHOS 73 12/26/2015 1457   ALKPHOS 79 07/26/2015 1251   AST 22 12/26/2015 1457   AST 20 07/26/2015 1251   ALT 14 12/26/2015 1457   ALT 13 07/26/2015 1251   BILITOT 0.6 12/26/2015 1457   BILITOT 0.37 07/26/2015 1251     Lab Results  Component Value Date   TSH 2.79 01/04/2015   Lab Results  Component Value Date   [id number] 06/07/2015      Assessment/Plan:   1.  Essential Tremor.  -We'll continue propranolol 20 mg daily.  Doing well with that.   -Off topamax due to cognitive change 2.  Memory loss.  I do suspect that she may have vascular dementia.  I also  suspect that depression plays a role here.  -We will schedule her for neurocognitive testing.  -Asked her to hold on driving until we get the results of this. 3.  Hx cerebral infarct in 08/2014 and then 10/2015.  [id number] looked embolic as were 2 separate areas of DWI positive lesions.  -MRA in 10/2015 demonstrated stable 7mm R ICA aneurysm.  Carotid u/s was negative for hemo signficant stenosis.  TEE completed which was negative, EF 55-60%.  She had a loop recorder inserted on 11/16/15  -On plavix and lipitor.  LDL now 64 after lipitor increased to 20 mg daily. 4.  B12 deficiency  -improved 5.  Follow up is anticipated in the next few months, sooner should new neurologic issues arise.    Much greater than 50% of this visit was spent in counseling and coordinating care.  Total face to face time:  30 min

## 2016-01-15 ENCOUNTER — Encounter: Payer: Medicare Other | Admitting: *Deleted

## 2016-01-16 ENCOUNTER — Encounter: Payer: Self-pay | Admitting: Neurology

## 2016-01-16 ENCOUNTER — Ambulatory Visit (INDEPENDENT_AMBULATORY_CARE_PROVIDER_SITE_OTHER): Payer: Medicare Other | Admitting: Neurology

## 2016-01-16 VITALS — BP 110/78 | HR 75 | Ht 61.0 in | Wt 125.0 lb

## 2016-01-16 DIAGNOSIS — G8194 Hemiplegia, unspecified affecting left nondominant side: Secondary | ICD-10-CM

## 2016-01-16 DIAGNOSIS — I63411 Cerebral infarction due to embolism of right middle cerebral artery: Secondary | ICD-10-CM | POA: Diagnosis not present

## 2016-01-16 DIAGNOSIS — G250 Essential tremor: Secondary | ICD-10-CM

## 2016-01-16 DIAGNOSIS — R413 Other amnesia: Secondary | ICD-10-CM | POA: Diagnosis not present

## 2016-01-22 ENCOUNTER — Ambulatory Visit: Payer: Medicare Other | Admitting: Family Medicine

## 2016-01-23 ENCOUNTER — Ambulatory Visit (INDEPENDENT_AMBULATORY_CARE_PROVIDER_SITE_OTHER): Payer: Medicare Other | Admitting: Family Medicine

## 2016-01-23 VITALS — BP 122/82 | HR 78 | Temp 98.1°F | Ht 61.0 in | Wt 127.5 lb

## 2016-01-23 DIAGNOSIS — R110 Nausea: Secondary | ICD-10-CM

## 2016-01-23 DIAGNOSIS — I10 Essential (primary) hypertension: Secondary | ICD-10-CM

## 2016-01-23 DIAGNOSIS — Z23 Encounter for immunization: Secondary | ICD-10-CM | POA: Diagnosis not present

## 2016-01-23 NOTE — Progress Notes (Signed)
Pre visit review using our clinic review tool, if applicable. No additional management support is needed unless otherwise documented below in the visit note. 

## 2016-01-23 NOTE — Progress Notes (Signed)
Subjective:     Patient ID: Carmen Duncan, female   DOB: [birth year], 75 y.o.   MRN: [id number]  HPI Patient seen for follow-up regarding recent stroke. She was seen for post hospital follow-up in July. Her blood pressure was elevated and we added amlodipine 2.5 mg daily. She's not had any dizziness. Denies any significant headache. No peripheral edema. Ambulating consistently with a cane. She has had some short-term memory loss and has been scheduled for neurocognitive testing through neurology and that starts next week  She denies any further falls since her last visit  She's had some intermittent nausea without vomiting. She has frequent allergic postnasal drip symptoms. She is taking [name] per pulmonary and she thinks her nausea seemed to worsen after she started that.  No abdominal pain. No dysuria. No fevers or chills.  Essential tremor on propranolol 20 mg daily and stable.  Past Medical History:  Diagnosis Date  . Acoustic neuroma ([place])   . Anxiety   . Arthritis   . Asthma   . Brain cancer ([place])   . Breast cancer of upper-outer quadrant of right female breast ([place]) 07/19/2015  . Diverticulosis   . Ejection fraction   . Essential tremor    on inderal  . GERD (gastroesophageal reflux disease)   . HOH (hard of hearing) left ear  . Hyperlipidemia   . Hypertension   . IBS (irritable bowel syndrome)   . Interstitial lung disease ([place])   . Pneumonia    04-2014, CAP  . Stroke [facility])    no deficits, on plavix   Past Surgical History:  Procedure Laterality Date  . ABDOMINAL HYSTERECTOMY  1980  . BIOPSY BREAST    . BREAST LUMPECTOMY WITH RADIOACTIVE SEED AND SENTINEL LYMPH NODE BIOPSY Right 08/10/2015   Procedure: BREAST LUMPECTOMY WITH RADIOACTIVE SEED AND SENTINEL LYMPH NODE BIOPSY;  Surgeon: Autumn Messing III, MD;  Location: [place];  Service: General;  Laterality: Right;  . CHOLECYSTECTOMY  2011  . EP IMPLANTABLE DEVICE N/A 11/16/2015   Procedure: Loop Recorder  Insertion;  Surgeon: [name], MD;  Location: [place] CV LAB;  Service: Cardiovascular;  Laterality: N/A;  . eyes  2007   cararacts  . TEE WITHOUT CARDIOVERSION N/A 09/05/2014   Procedure: TRANSESOPHAGEAL ECHOCARDIOGRAM (TEE);  Surgeon: Lelon Perla, MD;  Location: [facility] ENDOSCOPY;  Service: Cardiovascular;  Laterality: N/A;  . TEE WITHOUT CARDIOVERSION N/A 11/16/2015   Procedure: TRANSESOPHAGEAL ECHOCARDIOGRAM (TEE);  Surgeon: Larey Dresser, MD;  Location: [place];  Service: Cardiovascular;  Laterality: N/A;  . TONSILLECTOMY  1945    reports that she has never smoked. She has never used smokeless tobacco. She reports that she drinks alcohol. She reports that she does not use drugs. family history includes Heart disease (age of onset: 97) in her father; Hyperlipidemia in her father and mother; Hypertension in her mother; Lung cancer in her paternal uncle; Stroke in her father. Allergies  Allergen Reactions  . Latex Itching, Rash and Other (See Comments)    Red angry skin from latex tape  . Sulfonamide Derivatives Rash    hives  . Tape Itching and Rash    Please use paper tape or coban wrap!!     Review of Systems  Constitutional: Negative for appetite change, fatigue, fever and unexpected weight change.  Eyes: Negative for visual disturbance.  Respiratory: Negative for cough, chest tightness, shortness of breath and wheezing.   Cardiovascular: Negative for chest pain, palpitations and leg swelling.  Gastrointestinal: Positive for nausea. Negative for abdominal pain.  Genitourinary: Negative for dysuria.  Neurological: Negative for dizziness, seizures, syncope, weakness, light-headedness and headaches.       Objective:   Physical Exam  Constitutional: She appears well-developed and well-nourished.  Neck: Neck supple.  Cardiovascular: Normal rate and regular rhythm.   Pulmonary/Chest: Effort normal and breath sounds normal. No respiratory distress. She has no  wheezes. She has no rales.  Musculoskeletal: She exhibits no edema.  Neurological: She is alert.       Assessment:     #1 hypertension. Improved on low-dose amlodipine  #2 recent acute CVA clinically stable  #3 long-standing history of essential tremor stable on propranolol  #4 recent nausea probably related to medication    Plan:     -continue current medications -High dose flu vaccine given -She will consult with pulmonary regarding possible side effects with [name] -Proceed with neurocognitive testing as per neurology. -We have recommended no further driving independently until released by neurology  [name] MD [facility]

## 2016-01-25 ENCOUNTER — Ambulatory Visit (INDEPENDENT_AMBULATORY_CARE_PROVIDER_SITE_OTHER): Payer: Medicare Other | Admitting: Psychology

## 2016-01-25 ENCOUNTER — Encounter: Payer: Self-pay | Admitting: Psychology

## 2016-01-25 DIAGNOSIS — I63411 Cerebral infarction due to embolism of right middle cerebral artery: Secondary | ICD-10-CM

## 2016-01-25 DIAGNOSIS — R413 Other amnesia: Secondary | ICD-10-CM | POA: Diagnosis not present

## 2016-01-25 NOTE — Progress Notes (Signed)
NEUROPSYCHOLOGICAL INTERVIEW (CPT: [id number])  Name: Carmen Duncan Date of Birth: Feb 26, 1941 Date of Interview: 01/25/2016  Reason for Referral:  Carmen Duncan is a 75 y.o., right-handed female who is referred for neuropsychological evaluation by Dr. Wells Guiles Tat of [facility] due to concerns about memory loss. This patient is accompanied in the office by her family (son-Carmen Duncan, daughter-in-law-Carmen Duncan, granddaughter-Carmen Duncan) who supplement the history.  History of Presenting Problem:  Carmen Duncan has a long history of essential tremor for which she has been followed by Dr. Carles Collet since 2014. More recently, on 11/14/2015, the patient had left facial droop, speech change and left sided weakness. 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 She underwent rehabilitation from 6/23-11/27/2015. Then on 12/11/2015, she fell (face first) in her garage and sustained a nasal fracture. She reported that she was coming in from getting the newspaper and felt a sensation that she was going to fall and could not stop herself. She did not feel dizzy or light-headed beforehand. Her family was at home and they denied loss of consciousness or acute change in mental status/cognition. (The patient has lived with her son, daughter-in-law and three grandchildren since 2008.) She has not had any falls since the one in June.   At today's interview, the patient acknowledges having concerns about her memory, since the stroke. Her family reports they first noticed slight memory changes in early 2016 (e.g., forgetfulness for recent conversations). She had been sick with pneumonia in late 2015. They reported gradual onset and slowly progressive worsening of memory loss over time. The patient was also diagnosed with and treated for breast cancer in 06/2015. She had a lumpectomy and 20 radiation treatments. She did not have  chemotherapy. The family noticed memory decline early this year, and then more decline after her stroke in June. They reported that she will ask questions out of the blue, like (in January) "Have we had Easter yet?" This is happening more frequently. Her daughter in law also noted that she is asking questions about information that she has known a long time. For example, the patient worked in [employer] for over 20 years, and recently asked her "which one is the routing number?" on a personal check.  Upon direct questioning, the patient and her family reported the following:   Forgetting recent conversations/events: Yes. Reminders usually jog her memory. Repeating statements/questions: Yes Misplacing/losing items: Yes (glasses, cell phone, keys to car when was driving) Forgetting appointments or other obligations: Family manages appointments now (probably since Spring). Last year, DIL went to an appt with the patient (had not kept up with her appts at all at that point) - and pt went to wrong hospital and was not aware of where the correct one was although she had been there before. Forgetting to take medications: Son started managing this 1-2 mos ago (when patient came home from hospital, he saw she was having trouble with it, seemed more confused by it) Difficulty concentrating: No Starting but not finishing tasks: yes Word-finding difficulty: No Comprehension difficulty: No. 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 No hearing aids Getting lost when driving: No history of this. Family didn't have significant concerns prior to her stroke. Making wrong turns when driving: No  With regard to mood, the patient reports that it is stable. She notes that she feels down sometimes but she doesn't let  this stop her from doing things. She reports she has always been a "worrier" and somewhat anxious, and apparently there is a strong family history of this. Her son notices that she tends to jump to negative  conclusions more often now. He is concerned about loneliness. The patient's social circle is in [place], where she used to live. While she does talk to her friends on the phone regularly, she does not get to see them very often as they do not make the drive to [place]. The patient also seems to be more anxious about knowing where the family members are at all times. She frequently asks where people are, and when they are coming home. It seems that it is a combination of both anxiety and confusion/difficulty keeping up with all the schedules.  She has not had any hallucinations.  She has not been treated for a mental health disorder at any time in the past.  Family history is reportedly significant for dementia in the patient's father (passed away at 97yo from a stroke) and one of the patient's aunts.   Current Functioning: With regard to complex ADLs, the patient does manage her finances but it seems she may be having some trouble with late payments. She receives calls from bill collectors but as soon as she gets them she has someone take her to go pay the bill. Her family is helping with managing appointments and medications now. She does very little cooking but has not had any problems with this. She does do housework (e.g., washes dishes). She has not driven (aside from two brief occasions) since her stroke. Her son is not so concerned about her ability to drive as he is about her risk of falling and trouble walking across parking lots. In general, her physical endurance is lowered since the stroke. She gets out of breath when walking and has to stop for breaks. She does experience reduced balance / unsteadiness occasionally.  The patient denies any difficulties with sleep. She does take daytime naps more frequently now. She stated, "I love my naps." Her appetite is good.    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 Marital history: Widowed x10 years Children: One son, three grands Alcohol/Tobacco/Substances: Occasional drink (one small glass of wine). Never a smoker.   Medical History: Past Medical History:  Diagnosis Date  . Acoustic neuroma ([place])   . Anxiety   . Arthritis   . Asthma   . Brain cancer ([place])   . Breast cancer of upper-outer quadrant of right female breast ([place]) 07/19/2015  . Diverticulosis   . Ejection fraction   . Essential tremor    on inderal  . GERD (gastroesophageal reflux disease)   . HOH (hard of hearing) left ear  . Hyperlipidemia   . Hypertension   . IBS (irritable bowel syndrome)   . Interstitial lung disease ([place])   . Pneumonia    04-2014, CAP  . Stroke [facility])    no deficits, on plavix      Current Medications:  Outpatient Encounter Prescriptions as of 01/25/2016  Medication Sig  . acetaminophen (TYLENOL) 325 MG tablet Take 325 mg by mouth every 6 (six) hours as needed for headache.   [name] amLODipine (NORVASC) 2.5 MG tablet Take 1 tablet (2.5 mg total) by mouth daily.  [name] atorvastatin (LIPITOR) 20 MG tablet Take 1 tablet (20 mg total) by  mouth daily.  . clidinium-chlordiazePOXIDE (LIBRAX) 5-2.5 MG capsule Take 1 capsule by mouth 2 (two) times daily as needed. (Patient taking differently: Take 1 capsule by mouth at bedtime. )  . clopidogrel (PLAVIX) 75 MG tablet Take 1 tablet (75 mg total) by mouth daily.  . Nintedanib ([name]) 100 MG CAPS Take 100 mg by mouth 2 (two) times daily.  . pantoprazole (PROTONIX) 40 MG tablet Take 1 tablet (40 mg total) by mouth daily.  . propranolol (INDERAL) 20 MG tablet Take 1 tablet (20 mg total) by mouth daily. (Patient taking differently: Take 20 mg by mouth daily. Pt takes for BP and tremors. Do not d/c without neuro consult.)   No facility-administered encounter medications on file as of 01/25/2016.      Behavioral Observations:   Appearance: Neatly and  appropriately dressed and groomed Gait: Ambulated with a four-point cane Speech: Fluent; normal rate, rhythm and volume Thought process: Linear, goal directed Affect: Full, relatively euthymic, expressed appropriate concern about memory difficulties Interpersonal: Pleasant, appropriate   TESTING: There is medical necessity to proceed with neuropsychological assessment as the results will be used to aid in differential diagnosis and clinical decision-making and to inform specific treatment recommendations. Per the patient, her family and medical records reviewed, there has been a change in cognitive functioning and a reasonable suspicion of dementia. Differentials include vascular dementia and mixed dementia (Alzheimer's disease and vascular dementia).   PLAN: The patient will return for a full battery of neuropsychological testing with a psychometrician under my supervision. Education regarding testing procedures was provided. Subsequently, the patient will see this provider for a follow-up session at which time her test performances and my impressions and treatment recommendations will be reviewed in detail.   Full neuropsychological evaluation report to follow.

## 2016-01-26 ENCOUNTER — Other Ambulatory Visit (INDEPENDENT_AMBULATORY_CARE_PROVIDER_SITE_OTHER): Payer: Medicare Other

## 2016-01-26 DIAGNOSIS — J84112 Idiopathic pulmonary fibrosis: Secondary | ICD-10-CM | POA: Diagnosis not present

## 2016-01-26 LAB — HEPATIC FUNCTION PANEL
ALK PHOS: 61 U/L (ref 39–117)
ALT: 13 U/L (ref 0–35)
AST: 18 U/L (ref 0–37)
Albumin: 3.7 g/dL (ref 3.5–5.2)
BILIRUBIN DIRECT: 0.1 mg/dL (ref 0.0–0.3)
BILIRUBIN TOTAL: 0.4 mg/dL (ref 0.2–1.2)
Total Protein: 7.2 g/dL (ref 6.0–8.3)

## 2016-02-05 ENCOUNTER — Ambulatory Visit (INDEPENDENT_AMBULATORY_CARE_PROVIDER_SITE_OTHER): Payer: Medicare Other | Admitting: Psychology

## 2016-02-05 DIAGNOSIS — R413 Other amnesia: Secondary | ICD-10-CM

## 2016-02-05 NOTE — Progress Notes (Signed)
   Neuropsychology Note  Carmen Duncan returned today for 2 hours of neuropsychological testing with technician, Milana Kidney, BS, under the supervision of Dr. Macarthur Critchley. The patient did not appear overtly distressed by the testing session, per behavioral observation or via self-report to the technician. Rest breaks were offered. Carmen Duncan will return within 2 weeks for a feedback session with Dr. Si Raider at which time her test performances, clinical impressions and treatment recommendations will be reviewed in detail. The patient understands she can contact our office should she require our assistance before this time.  Full report to follow.

## 2016-02-08 NOTE — Progress Notes (Signed)
NEUROPSYCHOLOGICAL EVALUATION   Name:    Carmen Duncan  Date of Birth:   31-Mar-1941 Date of Interview:  01/25/2016 Date of Testing:  02/05/2016  Date of Feedback:  02/12/2016     Background Information:  Reason for Referral:  Carmen Duncan is a 75 y.o., right-handed female referred by Dr. Wells Guiles Tat to assess her current level of cognitive functioning and assist in differential diagnosis. The current evaluation consisted of a review of available medical records, an interview with the patient and her family (son-Mitch, daughter-in-law-Donna, granddaughter-Caroline), and the completion of a neuropsychological testing battery. Informed consent was obtained.  History of Presenting Problem:  Carmen Duncan has a long history of essential tremor for which she has been followed by Dr. Carles Collet since 2014. More recently, on 11/14/2015, the patient had left facial droop, speech change and left sided weakness. 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 She underwent rehabilitation from 6/23-11/27/2015. Then on 12/11/2015, she fell (face first) in her garage and sustained a nasal fracture. She reported that she was coming in from getting the newspaper and felt a sensation that she was going to fall and could not stop herself. She did not feel dizzy or light-headed beforehand. Her family was at home and they denied loss of consciousness or acute change in mental status/cognition. (The patient has lived with her son, daughter-in-law and three grandchildren since 2008.) She has not had any falls since the one in June.   At today's interview, the patient acknowledges having concerns about her memory, since the stroke. Her family reports they first noticed slight memory changes in early 2016 (e.g., forgetfulness for recent conversations). She had been sick with pneumonia in late 2015. They reported gradual  onset and slowly progressive worsening of memory loss over time. The patient was also diagnosed with and treated for breast cancer in 06/2015. She had a lumpectomy and 20 radiation treatments. She did not have chemotherapy. The family noticed memory decline early this year, and then more decline after her stroke in June. They reported that she will ask questions out of the blue, like (in January) "Have we had Easter yet?" This is happening more frequently. Her daughter in law also noted that she is asking questions about information that she has known a long time. For example, the patient worked in [employer] for over 20 years, and recently asked her "which one is the routing number?" on a personal check.  Upon direct questioning, the patient and her family reported the following:   Forgetting recent conversations/events: Yes. Reminders usually jog her memory. Repeating statements/questions: Yes Misplacing/losing items: Yes (glasses, cell phone, keys to car when was driving) Forgetting appointments or other obligations: Family manages appointments now (probably since Spring). Last year, DIL went to an appt with the patient (had not kept up with her appts at all at that point) - and pt went to wrong hospital and was not aware of where the correct one was although she had been there before. Forgetting to take medications: Son started managing this 1-2 mos ago (when patient came home from hospital, he saw she was having trouble with it, seemed more confused by it) Difficulty concentrating: No Starting but not finishing tasks: yes Word-finding difficulty: No Comprehension difficulty: No. 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 No hearing aids Getting lost when driving: No history  of this. Family didn't have significant concerns prior to her stroke. Making wrong turns when driving: No  With regard to mood, the patient reports that it is stable. She notes that she feels down sometimes but she doesn't  let this stop her from doing things. She reports she has always been a "worrier" and somewhat anxious, and apparently there is a strong family history of this. Her son notices that she tends to jump to negative conclusions more often now. He is concerned about loneliness. The patient's social circle is in [place], where she used to live. While she does talk to her friends on the phone regularly, she does not get to see them very often as they do not make the drive to [place]. The patient also seems to be more anxious about knowing where the family members are at all times. She frequently asks where people are, and when they are coming home. It seems that it is a combination of both anxiety and confusion/difficulty keeping up with all the schedules.  She has not had any hallucinations.  She has not been treated for a mental health disorder at any time in the past.  Family history is reportedly significant for dementia in the patient's father (passed away at 88yo from a stroke) and one of the patient's aunts.   Current Functioning: With regard to complex ADLs, the patient does manage her finances but it seems she may be having some trouble with late payments. She receives calls from bill collectors but as soon as she gets them she has someone take her to go pay the bill. Her family is helping with managing appointments and medications now. She does very little cooking but has not had any problems with this. She does do housework (e.g., washes dishes). She has not driven (aside from two brief occasions) since her stroke. Her son is not so concerned about her ability to drive as he is about her risk of falling and trouble walking across parking lots. In general, her physical endurance is lowered since the stroke. She gets out of breath when walking and has to stop for breaks. She does experience reduced balance / unsteadiness occasionally.  The patient denies any difficulties with sleep. She does  take daytime naps more frequently now. She stated, "I love my naps." Her appetite is good.    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 Marital history: Widowed x10 years Children: One son, three grands Alcohol/Tobacco/Substances: Occasional drink (one small glass of wine). Never a smoker.   Medical History:  Past Medical History:  Diagnosis Date  . Acoustic neuroma ([place])   . Anxiety   . Arthritis   . Asthma   . Brain cancer ([place])   . Breast cancer of upper-outer quadrant of right female breast ([place]) 07/19/2015  . Diverticulosis   . Ejection fraction   . Essential tremor    on inderal  . GERD (gastroesophageal reflux disease)   . HOH (hard of hearing) left ear  . Hyperlipidemia   . Hypertension   . IBS (irritable bowel syndrome)   . Interstitial lung disease ([place])   . Pneumonia    04-2014, [facility]  . Stroke [facility])    no deficits, on plavix    Current medications:  Outpatient Encounter Prescriptions as of 02/12/2016  Medication Sig  . acetaminophen (TYLENOL) 325 MG tablet Take 325 mg by mouth  every 6 (six) hours as needed for headache.   [name] amLODipine (NORVASC) 2.5 MG tablet Take 1 tablet (2.5 mg total) by mouth daily.  [name] atorvastatin (LIPITOR) 20 MG tablet Take 1 tablet (20 mg total) by mouth daily.  . clidinium-chlordiazePOXIDE (LIBRAX) 5-2.5 MG capsule Take 1 capsule by mouth 2 (two) times daily as needed. (Patient taking differently: Take 1 capsule by mouth at bedtime. )  . clopidogrel (PLAVIX) 75 MG tablet Take 1 tablet (75 mg total) by mouth daily.  . Nintedanib ([name]) 100 MG CAPS Take 100 mg by mouth 2 (two) times daily.  . pantoprazole (PROTONIX) 40 MG tablet Take 1 tablet (40 mg total) by mouth daily.  . propranolol (INDERAL) 20 MG tablet Take 1 tablet (20 mg total) by mouth daily. (Patient taking differently: Take 20 mg by  mouth daily. Pt takes for BP and tremors. Do not d/c without neuro consult.)   No facility-administered encounter medications on file as of 02/12/2016.      Current Examination:  Behavioral Observations:  Appearance: Neatly and appropriately dressed and groomed Gait: Ambulated with a four-point cane Speech: Fluent; normal rate, rhythm and volume Thought process: Linear, goal directed Affect: Full, relatively euthymic, expressed appropriate concern about memory difficulties Interpersonal: Pleasant, appropriate Orientation: Oriented to person, place and most aspects of time (incorrectly stated year as "1917 or 2917"). Accurately named the current President but inaccurately named his predecessor ("Bush").  Tests Administered: . Test of Premorbid Functioning (TOPF) . Wechsler Adult Intelligence Scale-Fourth Edition (WAIS-IV): Similarities, Block Design, Matrix Reasoning,  Coding and Digit Span subtests . [employer] Verbal Learning Test - 2nd Edition (CVLT-2) Short Form . Repeatable Battery for the Assessment of Neuropsychological Status (RBANS) Form A:  Figure Copy and Recall subtests, Story Memory and Recall subtests . Neuropsychological Assessment Battery (NAB) Language Module, Form 1: Naming Subtest . [place] Diagnostic Aphasia Examination: Complex Ideational Material Subtest . Controlled Oral Word Association Test (COWAT) . Trail Making Test A and B . Clock drawing test . Geriatric Depression Scale (GDS) 15 Item . Generalized Anxiety Disorder - 7 item screener (GAD-7)  Test Results: Note: Standardized scores are presented only for use by appropriately trained professionals and to allow for any future test-retest comparison. These scores should not be interpreted without consideration of all the information that is contained in the rest of the report. The most recent standardization samples from the test publisher or other sources were used whenever possible to derive standard scores;  scores were corrected for age, gender, ethnicity and education when available.   Test Scores:  Test Name Standardized Score Descriptor  TOPF SS= 91 Average  WAIS-IV Subtests    Similarities ss= 12 High average  Block Design ss= 9 Average  Matrix Reasoning ss= 8 Average  Coding ss= 7 Low average  Digit Span Forward ss= 8 Average  Digit Span Backward ss= 8 Average  RBANS Subtests    Figure Copy Z= -0.4 Average  Figure Recall Z= -1.8 Borderline  Story Memory Z= 0.4 Average  Story Recall Z= -0.5 Average  CVLT-II Scores    Trial 1 Z= -0.5 Average  Trial 4 Z= -1.5 Borderline  Trials 1-4 total T= 37 Low average  SD Free Recall Z= -1 Low average  LD Free Recall Z= -0.5 Average  LD Cued Recall Z= -0.5 Average  Recognition Discriminability (7/9 hits, 1 false positive) Z= -0.5 Average  Forced Choice Recognition Raw=  9/9 WNL  NAB Naming T= 42 Low average  BDAE Subtest  Complex Ideational Material Raw= 12/12   COWAT-FAS T= 26 Impaired  COWAT-Animals T= 26 Impaired  Trail Making Test A 1 error T= 26 Impaired  Trail Making Test B Discontinued Impaired  Clock Drawing  WNL   GDS-15 9/15 Moderate   GAD-7 7/21 Mild       Description of Test Results:  Premorbid verbal intellectual abilities were estimated to have been within the average range based on a test of word reading. Psychomotor processing speed ranged from low average to impaired. Auditory attention and working memory were average. Visual-spatial construction was average. Language abilities were variable. Specifically, confrontation naming was low average, and auditory comprehension of complex ideational material was intact, while semantic verbal fluency was impaired. With regard to verbal memory, encoding and acquisition of non-contextual information (i.e., word list) was low average across four learning trials. After a brief distracter task, free recall was low average. After a 10-minute delay, free recall was low average. With  cueing, recall improved to the average range. Performance on a yes/no recognition task was average. On another verbal memory test, encoding and acquisition of contextual auditory information (i.e., short story) was average. After a delay, free recall was average. With regard to non-verbal memory, delayed free recall of visual information was borderline impaired. Executive functioning was variable. Mental flexibility and set-shifting were severely impaired; she was unable to complete Tra[place] [address]. Verbal fluency with phonemic search restrictions was impaired. Verbal abstract reasoning was high average. Non-verbal abstract reasoning was average. Performance on a clock drawing task was intact. On self-report questionnaires, the patient's responses were  indicative of clinically significant depression and generalized anxiety at the present time. Depressive symptoms endorsed included: dissatisfaction with her life, dropping interests/activities, feeling life is empty, boredom, feeling helpless, feelings of worthlessness, and reduced energy. Anxiety symptoms endorsed included: worrying too much about different things and being unable to control or stop worrying. She also endorsed mild nervousness, difficulty relaxing and fear of something awful happening.   Clinical Impressions: Mild vascular dementia with moderate depression and mild generalized anxiety. Results of this evaluation demonstrated many areas of normal cognitive function. Meanwhile, significant impairments were noted in verbal fluency and mental flexibility/set-shifting. Furthermore, there is evidence that her cognitive deficits are interfering with her ability to manage instrumental ADLs such as appointments and medications. As such, diagnostic criteria are met for dementia, and this appears to be in the mild stage. The patient's cognitive profile is reflective of mild frontal-subcortical dysfunction. This is likely due to recent infarcts and chronic small  vessel disease. There is no evidence from this evaluation to suggest hippocampal consolidation dysfunction or underlying Alzheimer's disease at the present time. Ms. Speece is reporting moderate depression, and there is a long history of at least mild generalized anxiety disorder. I suspect depression could be exacerbating cognitive dysfunction to some extent.    Recommendations/Plan: Based on the findings of the present evaluation, the following recommendations are offered:  1. Optimal control of vascular risk factors is necessary to reduce the risk of future stroke and cognitive decline. 2. Antidepressant medication could be considered, but of course all recommendations with regard to medication are deferred to her treating physicians. 3. The patient will require assistance with complex tasks, such as managing appointments, finances, and medications. Her family is currently assisting her with appointments and medications. They should also oversee finances (e.g., regular weekly or monthly time to pay bills and go over finances).  4. I agree that she shouldn't be driving, based on her  performance on a cognitive test highly correlated with driving ability.  5. We could do re-evaluation in 9-12 months, given the recency of strokes, to determine any improvement or decline in cognitive functioning over time. 6. Increased socialization and regular activities is recommended to promote quality of life and enhance mood. 7. Structure and routine helps with executive dysfunction, and this should be implemented to the extent possible (e.g., daily routine, weekly activities).    Feedback to Patient: NATALEY BAHRI and her son and daughter-in-law returned for a feedback appointment on 02/12/2016 to review the results of her neuropsychological evaluation with this provider. 30 minutes face-to-face time was spent reviewing her test results, my impressions and my recommendations as detailed above.    Total time spent  on this patient's case: [id number] unit for interview with psychologist; [phone] units of testing by psychometrician under psychologist's supervision; [phone] units for medical record review, scoring of neuropsychological tests, interpretation of test results, preparation of this report, and review of results to the patient by psychologist.      Thank you for your referral of LORILYN LAITINEN. Please feel free to contact me if you have any questions or concerns regarding this report.

## 2016-02-12 ENCOUNTER — Encounter: Payer: Self-pay | Admitting: Psychology

## 2016-02-12 ENCOUNTER — Ambulatory Visit (INDEPENDENT_AMBULATORY_CARE_PROVIDER_SITE_OTHER): Payer: No Typology Code available for payment source | Admitting: Psychology

## 2016-02-12 DIAGNOSIS — F0150 Vascular dementia without behavioral disturbance: Secondary | ICD-10-CM | POA: Diagnosis not present

## 2016-02-12 DIAGNOSIS — I63411 Cerebral infarction due to embolism of right middle cerebral artery: Secondary | ICD-10-CM

## 2016-02-12 DIAGNOSIS — F32A Depression, unspecified: Secondary | ICD-10-CM

## 2016-02-12 DIAGNOSIS — F329 Major depressive disorder, single episode, unspecified: Secondary | ICD-10-CM

## 2016-02-22 ENCOUNTER — Ambulatory Visit: Payer: Medicare Other | Admitting: Neurology

## 2016-02-27 ENCOUNTER — Ambulatory Visit: Payer: Medicare Other | Admitting: Neurology

## 2016-03-08 ENCOUNTER — Telehealth: Payer: Self-pay | Admitting: *Deleted

## 2016-03-08 ENCOUNTER — Ambulatory Visit: Payer: Medicare Other | Admitting: Neurology

## 2016-03-08 DIAGNOSIS — J84112 Idiopathic pulmonary fibrosis: Secondary | ICD-10-CM

## 2016-03-08 NOTE — Telephone Encounter (Signed)
Order entered for every 3 months for LFT since patient is on [name].  Nothing further needed.

## 2016-03-08 NOTE — Telephone Encounter (Signed)
-----   Message from Glean Hess, [place] sent at 12/26/2015  2:40 PM EDT ----- Regarding: LFT order Place Standing order for every 3 months after 10/1

## 2016-03-12 NOTE — Progress Notes (Signed)
Subjective:   Carmen Duncan was seen in consultation in the movement disorder clinic at the request of Carmen Post, MD.  The evaluation is for tremor.  The patient is a 75 y.o. R handed female with a long hx of tremor.   The patient states that the tremor is in the L hand.  She notes it with activation.  If pouring something from a saucepan, it will start to tremor.  It seems to be worse in specific positions.  She has no trouble with putting on makeup or eating as this only affects her nondominant hand.  She has no trouble cutting food.  She has no vocal tremor, head tremor or RUE tremor.  She has no leg tremor.  Sometimes she feels like she is "walking like a drunk" because she is not stable.  She does not feel comfortable near the edge of a pier b/c she feels like she would fall off the edge.  She generally does not fall.  She did fall Jul 16, 2011 because she missed the last 3 steps at her home when she was coming down in a hurry.  She bruised her entire R leg and it is still very sore.    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  I do not have those records.  She saw Dr. [name].  She is on propranolol and she has been on it 7-8 years.  She is on 40 mg twice per day.  It does help.  She states that she was told that her propranolol could not be increased.  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  She sees a Publishing rights manager at [facility] and was told it was nothing to worry about.  She has it monitored q 3 years.  Her last MRI brain was done in [place].   Because of this, she will have intermittent numbness of the L face.    There was a fam hx of tremor in her sister and it is starting in her son, who is 41 years old.  Current/Previously tried tremor medications: propranolol, primidone  Current medications that may exacerbate tremor:  N/a  12/03/12:  She was started on primidone last visit.  She is only taking 1/2 of  a 50 mg tablet at night.  She didn't go up to a full tablet because she did not like the way that it made her feel.  She wants to d/c the medication.  She is on propranolol but the dose cannot be increased because her pulse is already on the low end of normal.  She is having some intermittent paresthesias in her fingertips, right more than left.  03/12/13:  Topamax was added last visit, which has helped but has decreased appetite.  She tremors much less now but notices it when stressed, fatigued or under pressure.  She c/o constipation with the topamax.  She has taste aversion with the medication.  She has some paresthesias with the medication.  She remains on propranolol but is off of the primidone.  She thinks that the SE are worth continuing to take the medication.  She has had worsening of vision but thinks that she had that prior to the topamax and has an eye appt Nov 4.  She worked on last visit.  B12 was reported.  Serum protein electrophoresis did not reveal M spike.  RPR was negative.  Urinary protein electrophoresis  showed some free kappa light chains but there was no monoclonal protein, no M spike.  RPR was negative.  07/28/13 update:  The pt was on topamax 200 mg daily.  She was unable to tolerate bid dosing of the medication.  She ended up going back to 100 mg at night and it makes her sleepy at night when she takes the medication but no hang over effect.  She still has some intermittent paresthesias.    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  She does c/o dull fronal headaches, daily for the last month.  She uses sinus rinse and it helps.  States that tremor has been stable.    02/03/14 update:  The patient has a history of essential tremor, and is currently on propranolol 40 mg once a day as well as Topamax 100 mg daily.   Unless she is very tired or upset, she  does well in regards to tremor.   She received notes from [name] at [facility] since last visit.  He just said that her schwannoma was stable in size and she just needs a repeat MRI for 10 years that they will plan on doing.  She did have a repeat urinary protein electrophoresis since our last visit that was unremarkable.  I had the opportunity to review notes from her primary care physician from 01/20/2014.  There were 2 brief episodes that the patient describes in which she was walking and her legs felt weak.  She felt near syncopal.  She was not dizzy but just stated that "I couldn't have moved if I had to."    It only lasted for a few minutes.  She had already decreased the propranolol on her own when these episodes happened.  She did that primarily because she would forget to take it bid but also because of fatigue.    08/04/14 update:  The patient returns today for follow-up.  Much has happened since her last visit.  Medical records were reviewed.  She has a history of essential tremor and was on propranolol 40 mg, but that has since been reduced to what medical records said was 10 mg daily but pt states is 5 mg daily because of other medical issues.  She remains on Topamax 100 mg daily.  In November, 2015, she went to her primary care physician complaining about malaise and she had a nuclear stress test that was a low risk study with a left ventricular ejection fraction of 83%.  She saw cardiology not long thereafter and ended up having a chest x-ray demonstrating bibasilar infiltrates.  A few days later she developed a fever and was treated for community-acquired pneumonia.  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  During that hospitalization, her propranolol was decreased from 40 mg daily to 10 mg daily because of hypotension.  She became more weak during that hospitalization and was discharged to a subacute rehabilitation facility and ultimately left that rehabilitation  facility and was discharged home on December 23.  She does think that tremor may have increased slightly.  08/30/14 update:  The patient is following up today at the request of her ophthalmologist.  Pt states that she just went for a "normal checkup" because her vision had changed a little and "I needed new frames."  She states that her eye doctor thought that  there was a stroke behind the left eye and that led to an MRI of the brain and MRA of the brain on 08/23/2014.  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  There was an enhancing lesion in the right parietal region as well.  I talked on the phone with the radiologist about this lesion and he felt that this was likely a subacute vascular insult and not a metastatic lesion.  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  She had a carotid ultrasound on 07/14/2014 demonstrating 1-39% stenosis bilaterally.  She did an echocardiogram last year when she was in the hospital on 04/20/2014 demonstrating an ejection fraction of 65-70%, moderate diastolic dysfunction and moderate pulmonary HTN  02/06/15 update:  The patient returns today for follow-up.  She is on Topamax, 100 mg daily and propranolol, 10 mg daily for essential tremor.  At our last visit, I had the patient discontinue her aspirin and we changed it to Plavix.  She is doing well on that.  No melena/hematochezia.  No falls.  She had a TEE that was negative for thrombus and demonstrated normal LV function.  She was supposed to wear an event monitor, and she did, but compliance was very limited and she only wore it for part of a few days, so there was limited data.  There was no arrhythmia when she did wear it.  She had a repeat MRI of the brain on 12/16/2014.  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  On prior MRAs this is consistent with atherosclerotic changes.   I have reviewed primary care records since our last visit.  She has been losing weight over the last several months and her primary care physician asked me if it was from the Topamax.  She has had a fairly extensive negative workup.  Although I doubt it given the amount of time that she has been on Topamax, I told him that she could discontinue it.  They opted to leave her on it for now and take a wait and see approach.  She states that she has no appetite but she is forcing herself to eat.    06/08/15 update:  The patient is following up today for her essential tremor.  Last visit I discontinued her Topamax because of weight loss and memory concerns.  She reports that she does well until she gets really tired and then she will have tremor of the L hand and have trouble pouring things.  She remains on propranolol, 10 mg daily for tremor.  I reviewed records since our last visit.  Her B12 level was very low at 177 and the patient admits she was not being compliant with taking her B12.  She is now on monthly injections.  Her B12 was rechecked yesterday and it was 877.  She is doing well on her Plavix and has had no new focal or lateralizing neuro sx's.  No bleeding.    10/26/15 update:  The patient follows up today regarding essential tremor.  She is on propranolol, 20 mg daily, which was increased last visit.  She states that she is doing "okay" - notices it in the L hand when tired,  mad or angry.  She also has a history of a cerebral infarction.  She is on Plavix.  She is on Lipitor, but when her last fasting lipids were checked in June, 2016 her LDL was 101.  I reviewed records since last visit.  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  Off of b12 injections and forgetting to take pills.     01/16/16 update.  The records that were made available to me were reviewed.  This patient is accompanied in the office by her son and daughter in law who supplements the history. Much has happened since our last visit.  In terms of tremor she is doing well and remains on propranolol 20 mg daily.  She went to the hospital on 11/14/15 after a fall with L facial droop, speech change, L sided weakness and ultimately found to have a 1cm DWI positive infarct in the R corona radiata and 1cm DWI positive infarct in the R periventricular WM.  I reviewed these films.  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  Carotid u/s was negative for hemo signficant stenosis.  TEE completed which was negative, EF 55-60%.  LDL was 64.  I had increased her lipitor last visit because LDL was 101 and goal was <70.  She had a loop recorder inserted on 6/22 and then went to rehab from 6/23-7/3.  She then went back to the ED on 7/17 after falling face first in her garage and had a nasal fx.  No surgery needed. She did f/u with Carmen Post, MD and her BP was elevated and he started her on amlodipine and removed her stitches.  Today, she states that her strength is back to normal except first thing in the AM she feels weak.  She is living with her family.  Memory has been an issue.  She has trouble remembering why she was in the hospital.  She is having to ask questions multiple times.  Son has concern about meds so he got pill box and that has helped.  Memory was a concern prior to the stroke but got worse after it.  Family states that it probably started in fall 2015 but most noticeable over this year.  Some trouble doing personal finances per family but pt denies this.  Resumed driving this week after son watched her.    03/14/16 update:  The patient follows up today, accompanied by her son, daughter in law, and granddaughter who supplement the history.  Overall, patient has been stable since last visit.  She has a loop recorder  which has not demonstrated any abnormal rhythms.  However, the last remote transmissions that I can see was in July.  She remains on Plavix and Lipitor.  Denies any bleeding.  Remains on propranolol for tremor and is doing well in that regard.  She had testing with Dr. Si Raider on 02/05/2016 and went over this with the patient and her son and daughter-in-law.  This demonstrated mild vascular dementia, moderate depression and mild generalized anxiety disorder.  It was recommended that the patient not drive.  She has been driving some.   Increased socialization was recommended.  She has been somewhat of a social recluse.  It was recommended that someone oversee her finances.    Outside reports reviewed: historical medical records.  Allergies  Allergen Reactions  . Latex Itching, Rash and Other (See Comments)    Red angry skin from  latex tape  . Sulfonamide Derivatives Rash    hives  . Tape Itching and Rash    Please use paper tape or coban wrap!!    Current Outpatient Prescriptions on File Prior to Visit  Medication Sig Dispense Refill  . acetaminophen (TYLENOL) 325 MG tablet Take 325 mg by mouth every 6 (six) hours as needed for headache.     [name] amLODipine (NORVASC) 2.5 MG tablet Take 1 tablet (2.5 mg total) by mouth daily. 30 tablet 5  . atorvastatin (LIPITOR) 20 MG tablet Take 1 tablet (20 mg total) by mouth daily. 30 tablet 11  . clidinium-chlordiazePOXIDE (LIBRAX) 5-2.5 MG capsule Take 1 capsule by mouth 2 (two) times daily as needed. (Patient taking differently: Take 1 capsule by mouth at bedtime. ) 60 capsule 3  . clopidogrel (PLAVIX) 75 MG tablet Take 1 tablet (75 mg total) by mouth daily. 30 tablet 5  . Nintedanib ([name]) 100 MG CAPS Take 100 mg by mouth 2 (two) times daily.    . propranolol (INDERAL) 20 MG tablet Take 1 tablet (20 mg total) by mouth daily. (Patient taking differently: Take 20 mg by mouth daily. Pt takes for BP and tremors. Do not d/c without neuro consult.) 30 tablet 6    No current facility-administered medications on file prior to visit.     Past Medical History:  Diagnosis Date  . Acoustic neuroma ([place])   . Anxiety   . Arthritis   . Asthma   . Brain cancer ([place])   . Breast cancer of upper-outer quadrant of right female breast ([place]) 07/19/2015  . Diverticulosis   . Ejection fraction   . Essential tremor    on inderal  . GERD (gastroesophageal reflux disease)   . HOH (hard of hearing) left ear  . Hyperlipidemia   . Hypertension   . IBS (irritable bowel syndrome)   . Interstitial lung disease ([place])   . Pneumonia    04-2014, CAP  . Stroke [facility])    no deficits, on plavix    Past Surgical History:  Procedure Laterality Date  . ABDOMINAL HYSTERECTOMY  1980  . BIOPSY BREAST    . BREAST LUMPECTOMY WITH RADIOACTIVE SEED AND SENTINEL LYMPH NODE BIOPSY Right 08/10/2015   Procedure: BREAST LUMPECTOMY WITH RADIOACTIVE SEED AND SENTINEL LYMPH NODE BIOPSY;  Surgeon: Autumn Messing III, MD;  Location: [facility];  Service: General;  Laterality: Right;  . CHOLECYSTECTOMY  2011  . EP IMPLANTABLE DEVICE N/A 11/16/2015   Procedure: Loop Recorder Insertion;  Surgeon: [name], MD;  Location: [place] CV LAB;  Service: Cardiovascular;  Laterality: N/A;  . eyes  2007   cararacts  . TEE WITHOUT CARDIOVERSION N/A 09/05/2014   Procedure: TRANSESOPHAGEAL ECHOCARDIOGRAM (TEE);  Surgeon: Lelon Perla, MD;  Location: [facility] ENDOSCOPY;  Service: Cardiovascular;  Laterality: N/A;  . TEE WITHOUT CARDIOVERSION N/A 11/16/2015   Procedure: TRANSESOPHAGEAL ECHOCARDIOGRAM (TEE);  Surgeon: Larey Dresser, MD;  Location: [place];  Service: Cardiovascular;  Laterality: N/A;  . TONSILLECTOMY  1945    Social History   Social History  . Marital status: Widowed    Spouse name: N/A  . Number of children: N/A  . Years of education: N/A   Occupational History  . Not on file.   Social History Main Topics  . Smoking status: Never Smoker  . Smokeless  tobacco: Never Used  . Alcohol use 0.0 oz/week     Comment: wine about 2 times a week   . Drug  use: No  . Sexual activity: Yes    Partners: Female   Other Topics Concern  . Not on file   Social History Narrative  . No narrative on file    Family Status  Relation Status  . Mother Deceased   acute leukemia  . Father Deceased   MI, CVA  . Sister Alive   tremor  . Child Alive   son, mild tremor  . Paternal Uncle     Review of Systems A complete 10 system ROS was obtained and was negative apart from what is mentioned.   Objective:   VITALS:   Vitals:   03/14/16 1118  BP: 120/70  Pulse: 83  Weight: 126 lb (57.2 kg)  Height: 5\' 1"  (1.549 m)   Wt Readings from Last 3 Encounters:  03/14/16 126 lb (57.2 kg)  01/23/16 127 lb 8 oz (57.8 kg)  01/16/16 125 lb (56.7 kg)   Gen:  Appears stated age and in NAD. HEENT:  Normocephalic, atraumatic. The mucous membranes are moist. The superficial temporal arteries are without ropiness or tenderness. Cardiovascular: Regular rate and rhythm. Lungs: Clear to auscultation bilaterally. Neck: There are no carotid bruits noted bilaterally.  NEUROLOGICAL:  Orientation:  The patient is able to correctly identify the month and year, but misses the date by one.  She scores a 3/4 on the clock drawing.  The hands are not even close to drawn correctly.  She is able to she asks her son several repetitive questions. Cranial nerves: There is good facial symmetry.  There is pseudoptosis from lid lag bilaterally.    Extraocular muscles are intact and visual fields are full to confrontational testing. Speech is fluent and clear. Soft palate rises symmetrically and there is no tongue deviation. Hearing is intact to conversational tone. Tone: Tone is good throughout.  There is no rigidity. Sensation: Sensation is intact to light touch throughout Coordination:  The patient has no dysdiadichokinesia or dysmetria.  No asymmetries. Motor: Strength is 5/5  in the right upper and lower extremities.  Strength is 5/5 in the left upper extremity, with the exception of the intrinsic muscles of the hand and strength is 4+/5 there are and strength is 4/5 in the left leg.  Shoulder shrug is equal bilaterally.  There is no pronator drift.  There are no fasciculations noted. Gait and Station: The patient drags the left leg when walking  MOVEMENT EXAM: Tremor:  There is mild tremor of L hand.  Becomes more evident when elbow bent and when given something of weight to hold.  Has intention tremor as well.   LABS  Lab Results  Component Value Date   WBC 8.0 12/11/2015   HGB 14.7 12/11/2015   HCT 44.2 12/11/2015   MCV 91.7 12/11/2015   PLT 212 12/11/2015     Chemistry      Component Value Date/Time   NA 140 12/11/2015 1152   NA 139 07/26/2015 1251   K 3.9 12/11/2015 1152   K 4.8 07/26/2015 1251   CL 107 12/11/2015 1152   CL 103 05/03/2014 0630   CO2 28 12/11/2015 1152   CO2 28 07/26/2015 1251   BUN 6 12/11/2015 1152   BUN 13.3 07/26/2015 1251   CREATININE 0.62 12/11/2015 1152   CREATININE 0.8 07/26/2015 1251      Component Value Date/Time   CALCIUM 9.2 12/11/2015 1152   CALCIUM 9.4 07/26/2015 1251   ALKPHOS 61 01/26/2016 1431   ALKPHOS 79 07/26/2015 1251   AST  18 01/26/2016 1431   AST 20 07/26/2015 1251   ALT 13 01/26/2016 1431   ALT 13 07/26/2015 1251   BILITOT 0.4 01/26/2016 1431   BILITOT 0.37 07/26/2015 1251     Lab Results  Component Value Date   TSH 2.79 01/04/2015   Lab Results  Component Value Date   [id number] 06/07/2015   Lab Results  Component Value Date   CHOL 132 11/15/2015   HDL 35 (L) 11/15/2015   LDLCALC 64 11/15/2015   LDLDIRECT 101.0 10/31/2014   TRIG 166 (H) 11/15/2015   CHOLHDL 3.8 11/15/2015      Assessment/Plan:   1.  Essential Tremor.  -We'll continue propranolol 20 mg daily.  Doing well with that.   -Off topamax due to cognitive change 2.  Vascular dementia.  I also suspect that  depression plays a role here.  -She had testing with Dr. Si Raider on 02/05/2016 and went over this with the patient and her son and daughter-in-law.  This demonstrated mild vascular dementia, moderate depression and mild generalized anxiety disorder.  It was recommended that the patient not drive.  I uphold this. 3.  Hx cerebral infarct in 08/2014 and then 10/2015.  [id number] looked embolic as were 2 separate areas of DWI positive lesions.  -MRA in 10/2015 demonstrated stable 72mm R ICA aneurysm.  Carotid u/s was negative for hemo signficant stenosis.  TEE completed which was negative, EF 55-60%.  She had a loop recorder inserted on 11/16/15  -On plavix and lipitor.  LDL now 64 after lipitor increased to 20 mg daily. 4.  B12 deficiency  -improved 5.  Depression  -pt denies but neuropsych testing showed evidence of this.  Very emontional in the office but mostly because I told her not to drive.  Offered driving evaluation, but did not recommend a, given the fact that this is mostly a physical test, and she already had the "mental" portion of it through neuropsych eval, which did not recommend driving.  If they want to pursue the [facility] driving evaluation, they will let me know.  -Start Lexapro, 10 kg daily.  Risks, benefits, side effects discussed.  -Told her that she needed increased socialization.  Discussed what this meant.  -counselor names provided 6.  Follow up is anticipated in the next few months, sooner should new neurologic issues arise.    Much greater than 50% of this visit was spent in counseling and coordinating care.  Total face to face time:  45 min

## 2016-03-14 ENCOUNTER — Encounter: Payer: Self-pay | Admitting: Neurology

## 2016-03-14 ENCOUNTER — Ambulatory Visit (INDEPENDENT_AMBULATORY_CARE_PROVIDER_SITE_OTHER): Payer: Medicare Other | Admitting: Neurology

## 2016-03-14 VITALS — BP 120/70 | HR 83 | Ht 61.0 in | Wt 126.0 lb

## 2016-03-14 DIAGNOSIS — F0150 Vascular dementia without behavioral disturbance: Secondary | ICD-10-CM | POA: Diagnosis not present

## 2016-03-14 DIAGNOSIS — F321 Major depressive disorder, single episode, moderate: Secondary | ICD-10-CM | POA: Diagnosis not present

## 2016-03-14 MED ORDER — ESCITALOPRAM OXALATE 10 MG PO TABS: 10.0000 mg | ORAL_TABLET | Freq: Every day | ORAL | 3 refills | Status: DC

## 2016-03-14 NOTE — Patient Instructions (Addendum)
1.  Start lexapro 10 mg daily.  Let me know if you have any side effects or problems with the medication.  It doesn't matter what time of day you take the medication.   2.  Start socializing with your friends.  You need to make plans with your friends 3.  If you wish to pursue [facility] driving program please call us.  Otherwise, I recommend no driving

## 2016-03-29 ENCOUNTER — Encounter: Payer: Self-pay | Admitting: Pulmonary Disease

## 2016-03-29 ENCOUNTER — Ambulatory Visit (INDEPENDENT_AMBULATORY_CARE_PROVIDER_SITE_OTHER): Payer: Medicare Other | Admitting: Pulmonary Disease

## 2016-03-29 VITALS — BP 116/72 | HR 73 | Ht 61.0 in | Wt 124.6 lb

## 2016-03-29 DIAGNOSIS — J84112 Idiopathic pulmonary fibrosis: Secondary | ICD-10-CM | POA: Diagnosis not present

## 2016-03-29 MED ORDER — ONDANSETRON HCL 4 MG PO TABS: 4.0000 mg | ORAL_TABLET | Freq: Three times a day (TID) | ORAL | 0 refills | Status: DC | PRN

## 2016-03-29 NOTE — Addendum Note (Signed)
Addended by: Benson Setting L on: 03/29/2016 03:45 PM   Modules accepted: Orders

## 2016-03-29 NOTE — Patient Instructions (Signed)
Blood work for liver function tests Breathing test will be scheduled Zofran 4 mg oral-take with [name] daily for one week with morning dose-  then as needed #30

## 2016-03-29 NOTE — Assessment & Plan Note (Signed)
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

## 2016-03-29 NOTE — Progress Notes (Signed)
   Subjective:    Patient ID: Carmen Duncan, female    DOB: 1941/03/31, 75 y.o.   MRN: [id number]  HPI  75yo never smoker with asthma, HTNfor follow-up of interstitial lung disease  Her son Carmen Duncan works for [employer] 03/2014 Seen for pulmonary consult during hospitalization for CAP for non-resolving infiltrates &dyspnea on exertion for several months history of Raynaud phenomenon She was treated with IV steroids and discharged on a slow prednisone taper x 4 months and Stopped by end of March 2016  03/29/2016  Chief Complaint  Patient presents with  . Follow-up    Pt. states her breathing is doing well, Still feeling nausea from [name] Pt. 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 03/2014 HRCT scan -showed peripheral patchy consolidation bilaterally-infection versus COP  Swallow eval neg for aspiration  03/2014 echo >> grade 2 diastolic dysfunction with fluid overload and elevated BNP  09/2014 HRCT chest - resolution of aspdz  Peripheral and basilar predominant pattern of subpleural reticulation, ground-glass, traction bronchiolectasis   08/2015 HRCT -worse honeycombing  Labs 08/2014 showed ESR at 23, Hypsensiivity panel neg ,  ANA positive but titer neg , CCP neg, hypersens panel neg .   PFT 09/2014 showed mild to mod restriction FVC at 60%,  FEV1 at 64%, ratio 79, with low DLCO at 50%   Review of  Systems neg for any significant sore throat, dysphagia, itching, sneezing, nasal congestion or excess/ purulent secretions, fever, chills, sweats, unintended wt loss, pleuritic or exertional cp, hempoptysis, orthopnea pnd or change in chronic leg swelling.   Also denies presyncope, palpitations, heartburn, abdominal pain, nausea, vomiting, diarrhea or change in bowel or urinary habits, dysuria,hematuria, rash, arthralgias, visual complaints, headache, numbness weakness or ataxia.     Objective:   Physical Exam  Gen. Pleasant, well-nourished, in no distress ENT - no lesions, no post nasal drip Neck: No JVD, no thyromegaly, no carotid bruits Lungs: no use of accessory muscles, no dullness to percussion, bibasalt rales, no rhonchi  Cardiovascular: Rhythm regular, heart sounds  normal, no murmurs or gallops, no peripheral edema Musculoskeletal: No deformities, no cyanosis or clubbing         Assessment & Plan:

## 2016-04-02 ENCOUNTER — Other Ambulatory Visit (INDEPENDENT_AMBULATORY_CARE_PROVIDER_SITE_OTHER): Payer: Medicare Other

## 2016-04-02 DIAGNOSIS — J84112 Idiopathic pulmonary fibrosis: Secondary | ICD-10-CM

## 2016-04-02 LAB — HEPATIC FUNCTION PANEL
ALK PHOS: 59 U/L (ref 39–117)
ALT: 12 U/L (ref 0–35)
AST: 16 U/L (ref 0–37)
Albumin: 4 g/dL (ref 3.5–5.2)
BILIRUBIN DIRECT: 0.1 mg/dL (ref 0.0–0.3)
BILIRUBIN TOTAL: 0.6 mg/dL (ref 0.2–1.2)
Total Protein: 7.6 g/dL (ref 6.0–8.3)

## 2016-04-02 NOTE — Progress Notes (Signed)
Informed her son Carmen Duncan of Dr. Bari Mantis result message. He had no further questions.

## 2016-04-10 ENCOUNTER — Telehealth: Payer: Self-pay | Admitting: Pulmonary Disease

## 2016-04-11 NOTE — Telephone Encounter (Signed)
Please ask patient if she has tried Phenergan 25 mg twice a day as needed for nausea

## 2016-04-11 NOTE — Telephone Encounter (Signed)
Spoke with pt's son [name] (dpr on file), states that pt has taken Phenergan before but he is concerned with the increased fall risks from taking this medication, and does not want pt to take this medication again.    RA please advise.  Thanks!

## 2016-04-11 NOTE — Telephone Encounter (Signed)
RA the pt was given rx for  zofran on 11/3. The pharmacy is calling stating that this medication will need PA done.  Do you want to initiate the PA or change to something else?  Please advise. Thanks  Allergies  Allergen Reactions  . Latex Itching, Rash and Other (See Comments)    Red angry skin from latex tape  . Sulfonamide Derivatives Rash    hives  . Tape Itching and Rash    Please use paper tape or coban wrap!!

## 2016-04-12 NOTE — Telephone Encounter (Signed)
Okay to initiate PA

## 2016-04-12 NOTE — Telephone Encounter (Signed)
Received PA from [place]. Called [employer] at [phone]. Was advised that PA needed to be completed on Cover My Meds. Key: [id number]. Will await PA decision.  Pt's son is aware that we have started this process.

## 2016-04-17 NOTE — Telephone Encounter (Signed)
Called [employer] and was informed that Zofran was approved for one year. LMTCB for pt's EC, Mitch.

## 2016-04-19 NOTE — Telephone Encounter (Signed)
Called and spoke with [facility] and he stated that they did pick up the medication and this has been helping the pt.

## 2016-04-29 ENCOUNTER — Other Ambulatory Visit: Payer: Self-pay | Admitting: Neurology

## 2016-04-29 MED ORDER — CLOPIDOGREL BISULFATE 75 MG PO TABS: 75.0000 mg | ORAL_TABLET | Freq: Every day | ORAL | 5 refills | Status: DC

## 2016-05-31 DIAGNOSIS — D0511 Intraductal carcinoma in situ of right breast: Secondary | ICD-10-CM | POA: Diagnosis not present

## 2016-07-02 ENCOUNTER — Ambulatory Visit: Payer: Medicare Other | Admitting: Pulmonary Disease

## 2016-07-02 DIAGNOSIS — H5203 Hypermetropia, bilateral: Secondary | ICD-10-CM | POA: Diagnosis not present

## 2016-07-05 ENCOUNTER — Ambulatory Visit: Payer: Medicare Other | Admitting: Pulmonary Disease

## 2016-07-06 ENCOUNTER — Other Ambulatory Visit: Payer: Self-pay | Admitting: Family Medicine

## 2016-07-06 ENCOUNTER — Other Ambulatory Visit: Payer: Self-pay | Admitting: Neurology

## 2016-07-08 DIAGNOSIS — R922 Inconclusive mammogram: Secondary | ICD-10-CM | POA: Diagnosis not present

## 2016-07-08 LAB — HM MAMMOGRAPHY: HM MAMMO: NORMAL (ref 0–4)

## 2016-07-10 ENCOUNTER — Encounter: Payer: Self-pay | Admitting: Family Medicine

## 2016-07-16 NOTE — Progress Notes (Signed)
Subjective:   Carmen Duncan was seen in consultation in the movement disorder clinic at the request of [name], MD.  The evaluation is for tremor.  The patient is a 76 y.o. R handed female with a long hx of tremor.   The patient states that the tremor is in the L hand.  She notes it with activation.  If pouring something from a saucepan, it will start to tremor.  It seems to be worse in specific positions.  She has no trouble with putting on makeup or eating as this only affects her nondominant hand.  She has no trouble cutting food.  She has no vocal tremor, head tremor or RUE tremor.  She has no leg tremor.  Sometimes she feels like she is "walking like a drunk" because she is not stable.  She does not feel comfortable near the edge of a pier b/c she feels like she would fall off the edge.  She generally does not fall.  She did fall Jul 16, 2011 because she missed the last 3 steps at her home when she was coming down in a hurry.  She bruised her entire R leg and it is still very sore.    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  I do not have those records.  She saw Dr. [name].  She is on propranolol and she has been on it 7-8 years.  She is on 40 mg twice per day.  It does help.  She states that she was told that her propranolol could not be increased.  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  She sees a Publishing rights manager at [facility] and was told it was nothing to worry about.  She has it monitored q 3 years.  Her last MRI brain was done in [place].   Because of this, she will have intermittent numbness of the L face.    There was a fam hx of tremor in her sister and it is starting in her son, who is 38 years old.  Current/Previously tried tremor medications: propranolol, primidone  Current medications that may exacerbate tremor:  N/a  12/03/12:  She was started on primidone last visit.  She is only taking 1/2 of  a 50 mg tablet at night.  She didn't go up to a full tablet because she did not like the way that it made her feel.  She wants to d/c the medication.  She is on propranolol but the dose cannot be increased because her pulse is already on the low end of normal.  She is having some intermittent paresthesias in her fingertips, right more than left.  03/12/13:  Topamax was added last visit, which has helped but has decreased appetite.  She tremors much less now but notices it when stressed, fatigued or under pressure.  She c/o constipation with the topamax.  She has taste aversion with the medication.  She has some paresthesias with the medication.  She remains on propranolol but is off of the primidone.  She thinks that the SE are worth continuing to take the medication.  She has had worsening of vision but thinks that she had that prior to the topamax and has an eye appt Nov 4.  She worked on last visit.  B12 was reported.  Serum protein electrophoresis did not reveal M spike.  RPR was negative.  Urinary protein electrophoresis  showed some free kappa light chains but there was no monoclonal protein, no M spike.  RPR was negative.  07/28/13 update:  The pt was on topamax 200 mg daily.  She was unable to tolerate bid dosing of the medication.  She ended up going back to 100 mg at night and it makes her sleepy at night when she takes the medication but no hang over effect.  She still has some intermittent paresthesias.    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  She does c/o dull fronal headaches, daily for the last month.  She uses sinus rinse and it helps.  States that tremor has been stable.    02/03/14 update:  The patient has a history of essential tremor, and is currently on propranolol 40 mg once a day as well as Topamax 100 mg daily.   Unless she is very tired or upset, she  does well in regards to tremor.   She received notes from [name] at [facility] since last visit.  He just said that her schwannoma was stable in size and she just needs a repeat MRI for 10 years that they will plan on doing.  She did have a repeat urinary protein electrophoresis since our last visit that was unremarkable.  I had the opportunity to review notes from her primary care physician from 01/20/2014.  There were 2 brief episodes that the patient describes in which she was walking and her legs felt weak.  She felt near syncopal.  She was not dizzy but just stated that "I couldn't have moved if I had to."    It only lasted for a few minutes.  She had already decreased the propranolol on her own when these episodes happened.  She did that primarily because she would forget to take it bid but also because of fatigue.    08/04/14 update:  The patient returns today for follow-up.  Much has happened since her last visit.  Medical records were reviewed.  She has a history of essential tremor and was on propranolol 40 mg, but that has since been reduced to what medical records said was 10 mg daily but pt states is 5 mg daily because of other medical issues.  She remains on Topamax 100 mg daily.  In November, 2015, she went to her primary care physician complaining about malaise and she had a nuclear stress test that was a low risk study with a left ventricular ejection fraction of 83%.  She saw cardiology not long thereafter and ended up having a chest x-ray demonstrating bibasilar infiltrates.  A few days later she developed a fever and was treated for community-acquired pneumonia.  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  During that hospitalization, her propranolol was decreased from 40 mg daily to 10 mg daily because of hypotension.  She became more weak during that hospitalization and was discharged to a subacute rehabilitation facility and ultimately left that rehabilitation  facility and was discharged home on December 23.  She does think that tremor may have increased slightly.  08/30/14 update:  The patient is following up today at the request of her ophthalmologist.  Pt states that she just went for a "normal checkup" because her vision had changed a little and "I needed new frames."  She states that her eye doctor thought that  there was a stroke behind the left eye and that led to an MRI of the brain and MRA of the brain on 08/23/2014.  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  There was an enhancing lesion in the right parietal region as well.  I talked on the phone with the radiologist about this lesion and he felt that this was likely a subacute vascular insult and not a metastatic lesion.  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  She had a carotid ultrasound on 07/14/2014 demonstrating 1-39% stenosis bilaterally.  She did an echocardiogram last year when she was in the hospital on 04/20/2014 demonstrating an ejection fraction of 65-70%, moderate diastolic dysfunction and moderate pulmonary HTN  02/06/15 update:  The patient returns today for follow-up.  She is on Topamax, 100 mg daily and propranolol, 10 mg daily for essential tremor.  At our last visit, I had the patient discontinue her aspirin and we changed it to Plavix.  She is doing well on that.  No melena/hematochezia.  No falls.  She had a TEE that was negative for thrombus and demonstrated normal LV function.  She was supposed to wear an event monitor, and she did, but compliance was very limited and she only wore it for part of a few days, so there was limited data.  There was no arrhythmia when she did wear it.  She had a repeat MRI of the brain on 12/16/2014.  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  On prior MRAs this is consistent with atherosclerotic changes.   I have reviewed primary care records since our last visit.  She has been losing weight over the last several months and her primary care physician asked me if it was from the Topamax.  She has had a fairly extensive negative workup.  Although I doubt it given the amount of time that she has been on Topamax, I told him that she could discontinue it.  They opted to leave her on it for now and take a wait and see approach.  She states that she has no appetite but she is forcing herself to eat.    06/08/15 update:  The patient is following up today for her essential tremor.  Last visit I discontinued her Topamax because of weight loss and memory concerns.  She reports that she does well until she gets really tired and then she will have tremor of the L hand and have trouble pouring things.  She remains on propranolol, 10 mg daily for tremor.  I reviewed records since our last visit.  Her B12 level was very low at 177 and the patient admits she was not being compliant with taking her B12.  She is now on monthly injections.  Her B12 was rechecked yesterday and it was 877.  She is doing well on her Plavix and has had no new focal or lateralizing neuro sx's.  No bleeding.    10/26/15 update:  The patient follows up today regarding essential tremor.  She is on propranolol, 20 mg daily, which was increased last visit.  She states that she is doing "okay" - notices it in the L hand when tired,  mad or angry.  She also has a history of a cerebral infarction.  She is on Plavix.  She is on Lipitor, but when her last fasting lipids were checked in June, 2016 her LDL was 101.  I reviewed records since last visit.  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  Off of b12 injections and forgetting to take pills.     01/16/16 update.  The records that were made available to me were reviewed.  This patient is accompanied in the office by her son and daughter in law who supplements the history. Much has happened since our last visit.  In terms of tremor she is doing well and remains on propranolol 20 mg daily.  She went to the hospital on 11/14/15 after a fall with L facial droop, speech change, L sided weakness and ultimately found to have a 1cm DWI positive infarct in the R corona radiata and 1cm DWI positive infarct in the R periventricular WM.  I reviewed these films.  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  Carotid u/s was negative for hemo signficant stenosis.  TEE completed which was negative, EF 55-60%.  LDL was 64.  I had increased her lipitor last visit because LDL was 101 and goal was <70.  She had a loop recorder inserted on 6/22 and then went to rehab from 6/23-7/3.  She then went back to the ED on 7/17 after falling face first in her garage and had a nasal fx.  No surgery needed. She did f/u with [name], MD and her BP was elevated and he started her on amlodipine and removed her stitches.  Today, she states that her strength is back to normal except first thing in the AM she feels weak.  She is living with her family.  Memory has been an issue.  She has trouble remembering why she was in the hospital.  She is having to ask questions multiple times.  Son has concern about meds so he got pill box and that has helped.  Memory was a concern prior to the stroke but got worse after it.  Family states that it probably started in fall 2015 but most noticeable over this year.  Some trouble doing personal finances per family but pt denies this.  Resumed driving this week after son watched her.    03/14/16 update:  The patient follows up today, accompanied by her son, daughter in law, and granddaughter who supplement the history.  Overall, patient has been stable since last visit.  She has a loop recorder  which has not demonstrated any abnormal rhythms.  However, the last remote transmissions that I can see was in July.  She remains on Plavix and Lipitor.  Denies any bleeding.  Remains on propranolol for tremor and is doing well in that regard.  She had testing with Dr. Si Raider on 02/05/2016 and went over this with the patient and her son and daughter-in-law.  This demonstrated mild vascular dementia, moderate depression and mild generalized anxiety disorder.  It was recommended that the patient not drive.  She has been driving some.   Increased socialization was recommended.  She has been somewhat of a social recluse.  It was recommended that someone oversee her finances.  07/18/16 update:  Patient follows up today, accompanied by her son who supplements the history.  The records that were made available to me were reviewed.  Saw Dr.  Alva after our last visit and put on zofran for nausea which did help.  She remains on propranolol for tremor, which has been fairly well-controlled.  We started Lexapro last visit, and she states it is helping and family agrees.  She has had a few episodes where she gradually got herself to the floor but wasn't necessarily a hard fall.  2 of them were when she tried to get the bathroom quickly so that she didn't have incontinence.  Both were during the night.  Son doesn't think that she needs bedside commode yet.      Outside reports reviewed: historical medical records.  Allergies  Allergen Reactions  . Latex Itching, Rash and Other (See Comments)    Red angry skin from latex tape  . Sulfonamide Derivatives Rash    hives  . Tape Itching and Rash    Please use paper tape or coban wrap!!    Current Outpatient Prescriptions on File Prior to Visit  Medication Sig Dispense Refill  . acetaminophen (TYLENOL) 325 MG tablet Take 325 mg by mouth every 6 (six) hours as needed for headache.     [name] amLODipine (NORVASC) 2.5 MG tablet TAKE ONE TABLET BY MOUTH ONCE DAILY 90 tablet 2   . atorvastatin (LIPITOR) 20 MG tablet Take 1 tablet (20 mg total) by mouth daily. 30 tablet 11  . clidinium-chlordiazePOXIDE (LIBRAX) 5-2.5 MG capsule Take 1 capsule by mouth 2 (two) times daily as needed. (Patient taking differently: Take 1 capsule by mouth at bedtime. ) 60 capsule 3  . clopidogrel (PLAVIX) 75 MG tablet Take 1 tablet (75 mg total) by mouth daily. 30 tablet 5  . escitalopram (LEXAPRO) 10 MG tablet Take 1 tablet (10 mg total) by mouth daily. 30 tablet 3  . Nintedanib ([name]) 100 MG CAPS Take 100 mg by mouth 2 (two) times daily.    . ondansetron (ZOFRAN) 4 MG tablet Take 1 tablet (4 mg total) by mouth every 8 (eight) hours as needed for nausea or vomiting. 30 tablet 0  . propranolol (INDERAL) 20 MG tablet TAKE ONE TABLET BY MOUTH ONCE DAILY 90 tablet 0   No current facility-administered medications on file prior to visit.     Past Medical History:  Diagnosis Date  . Acoustic neuroma ([place])   . Anxiety   . Arthritis   . Asthma   . Brain cancer ([place])   . Breast cancer of upper-outer quadrant of right female breast ([place]) 07/19/2015  . Diverticulosis   . Ejection fraction   . Essential tremor    on inderal  . GERD (gastroesophageal reflux disease)   . HOH (hard of hearing) left ear  . Hyperlipidemia   . Hypertension   . IBS (irritable bowel syndrome)   . Interstitial lung disease ([place])   . Pneumonia    04-2014, [facility]  . Stroke [facility])    no deficits, on plavix    Past Surgical History:  Procedure Laterality Date  . ABDOMINAL HYSTERECTOMY  1980  . BIOPSY BREAST    . BREAST LUMPECTOMY WITH RADIOACTIVE SEED AND SENTINEL LYMPH NODE BIOPSY Right 08/10/2015   Procedure: BREAST LUMPECTOMY WITH RADIOACTIVE SEED AND SENTINEL LYMPH NODE BIOPSY;  Surgeon: Autumn Messing III, MD;  Location: [place];  Service: General;  Laterality: Right;  . CHOLECYSTECTOMY  2011  . EP IMPLANTABLE DEVICE N/A 11/16/2015   Procedure: Loop Recorder Insertion;  Surgeon: [name], MD;   Location: [place] CV LAB;  Service: Cardiovascular;  Laterality: N/A;  .  eyes  2007   cararacts  . TEE WITHOUT CARDIOVERSION N/A 09/05/2014   Procedure: TRANSESOPHAGEAL ECHOCARDIOGRAM (TEE);  Surgeon: Lelon Perla, MD;  Location: [facility] ENDOSCOPY;  Service: Cardiovascular;  Laterality: N/A;  . TEE WITHOUT CARDIOVERSION N/A 11/16/2015   Procedure: TRANSESOPHAGEAL ECHOCARDIOGRAM (TEE);  Surgeon: Larey Dresser, MD;  Location: [place];  Service: Cardiovascular;  Laterality: N/A;  . TONSILLECTOMY  1945    Social History   Social History  . Marital status: Widowed    Spouse name: N/A  . Number of children: N/A  . Years of education: N/A   Occupational History  . Not on file.   Social History Main Topics  . Smoking status: Never Smoker  . Smokeless tobacco: Never Used  . Alcohol use 0.0 oz/week     Comment: wine about 2 times a week   . Drug use: No  . Sexual activity: Yes    Partners: Female   Other Topics Concern  . Not on file   Social History Narrative  . No narrative on file    Family Status  Relation Status  . Mother Deceased   acute leukemia  . Father Deceased   MI, CVA  . Sister Alive   tremor  . Child Alive   son, mild tremor  . Paternal Uncle     Review of Systems A complete 10 system ROS was obtained and was negative apart from what is mentioned.   Objective:   VITALS:   Vitals:   07/18/16 1125  BP: 90/60  Pulse: 73  SpO2: 94%   Wt Readings from Last 3 Encounters:  03/29/16 124 lb 9.6 oz (56.5 kg)  03/14/16 126 lb (57.2 kg)  01/23/16 127 lb 8 oz (57.8 kg)   Gen:  Appears stated age and in NAD. HEENT:  Normocephalic, atraumatic. The mucous membranes are moist. The superficial temporal arteries are without ropiness or tenderness. Cardiovascular: Regular rate and rhythm. Lungs: Clear to auscultation bilaterally. Neck: There are no carotid bruits noted bilaterally.  NEUROLOGICAL:  Orientation:  The patient is able to correctly  identify the month and year, but misses the date by one.  She scores a 3/4 on the clock drawing.  The hands are not even close to drawn correctly.  She is able to she asks her son several repetitive questions. Cranial nerves: There is good facial symmetry.  There is pseudoptosis from lid lag bilaterally.    Extraocular muscles are intact and visual fields are full to confrontational testing. Speech is fluent and clear. Soft palate rises symmetrically and there is no tongue deviation. Hearing is intact to conversational tone. Tone: Tone is good throughout.  There is no rigidity. Sensation: Sensation is intact to light touch throughout Coordination:  The patient has no dysdiadichokinesia or dysmetria.  No asymmetries. Motor: Strength is 5/5 in the right upper and lower extremities.  Strength is 5/5 throughout today.  Shoulder shrug is equal bilaterally.  There is no pronator drift.  There are no fasciculations noted. Gait and Station: The patient has a slightly antalgic and mildly unstable gait  MOVEMENT EXAM: Tremor:  There is no tremor of outstretched hands or with intention today   LABS  Lab Results  Component Value Date   WBC 8.0 12/11/2015   HGB 14.7 12/11/2015   HCT 44.2 12/11/2015   MCV 91.7 12/11/2015   PLT 212 12/11/2015     Chemistry      Component Value Date/Time   NA 140 12/11/2015 1152  NA 139 07/26/2015 1251   K 3.9 12/11/2015 1152   K 4.8 07/26/2015 1251   CL 107 12/11/2015 1152   CL 103 05/03/2014 0630   CO2 28 12/11/2015 1152   CO2 28 07/26/2015 1251   BUN 6 12/11/2015 1152   BUN 13.3 07/26/2015 1251   CREATININE 0.62 12/11/2015 1152   CREATININE 0.8 07/26/2015 1251      Component Value Date/Time   CALCIUM 9.2 12/11/2015 1152   CALCIUM 9.4 07/26/2015 1251   ALKPHOS 59 04/02/2016 1414   ALKPHOS 79 07/26/2015 1251   AST 16 04/02/2016 1414   AST 20 07/26/2015 1251   ALT 12 04/02/2016 1414   ALT 13 07/26/2015 1251   BILITOT 0.6 04/02/2016 1414   BILITOT 0.37  07/26/2015 1251     Lab Results  Component Value Date   TSH 2.79 01/04/2015   Lab Results  Component Value Date   [id number] 06/07/2015   Lab Results  Component Value Date   CHOL 132 11/15/2015   HDL 35 (L) 11/15/2015   LDLCALC 64 11/15/2015   LDLDIRECT 101.0 10/31/2014   TRIG 166 (H) 11/15/2015   CHOLHDL 3.8 11/15/2015      Assessment/Plan:   1.  Essential Tremor.  -Talked about whether or not to continue propranolol.  She wants to stay on the medication, but her blood pressure was quite low today, although trending blood pressures were not quite this well.  She is not on a lot of propranolol, and is also on low-dose amlodipine.  She has not taken her blood pressure medication yet today, and I asked her to hold that and told her I would get in contact with her primary care physician.  She has not been drinking much water and I encouraged her to do that.  -Off topamax due to cognitive change 2.  Vascular dementia.    -She had testing with Dr. Si Raider on 02/05/2016 and went over this with the patient and her son and daughter-in-law.  This demonstrated mild vascular dementia, moderate depression and mild generalized anxiety disorder.  It was recommended that the patient not drive.  I uphold this. 3.  Hx cerebral infarct in 08/2014 and then 10/2015.  [id number] looked embolic as were 2 separate areas of DWI positive lesions.  -MRA in 10/2015 demonstrated stable 74mm R ICA aneurysm.  Carotid u/s was negative for hemo signficant stenosis.  TEE completed which was negative, EF 55-60%.  She had a loop recorder inserted on 11/16/15  -On plavix and lipitor.  LDL now 64 after lipitor increased to 20 mg daily. 4.  B12 deficiency  -improved 5.  Depression  -pt denies but neuropsych testing showed evidence of this.  No driving recommended.  Pt remains frustrated over this recommendation  -continue Lexapro, 10 mg daily.  Risks, benefits, side effects discussed.  -Told her that she needed increased  socialization.  Discussed what this meant.  Discussed this last visit as well 6.  Follow up is anticipated in the next few months, sooner should new neurologic issues arise.    Much greater than 50% of this visit was spent in counseling and coordinating care.  Total face to face time:  20 min

## 2016-07-18 ENCOUNTER — Ambulatory Visit (INDEPENDENT_AMBULATORY_CARE_PROVIDER_SITE_OTHER): Payer: Medicare Other | Admitting: Neurology

## 2016-07-18 ENCOUNTER — Encounter: Payer: Self-pay | Admitting: Neurology

## 2016-07-18 VITALS — BP 90/60 | HR 73

## 2016-07-18 DIAGNOSIS — F321 Major depressive disorder, single episode, moderate: Secondary | ICD-10-CM | POA: Diagnosis not present

## 2016-07-18 DIAGNOSIS — G250 Essential tremor: Secondary | ICD-10-CM

## 2016-08-11 ENCOUNTER — Other Ambulatory Visit: Payer: Self-pay | Admitting: Neurology

## 2016-08-15 ENCOUNTER — Ambulatory Visit (INDEPENDENT_AMBULATORY_CARE_PROVIDER_SITE_OTHER): Payer: Medicare Other | Admitting: Pulmonary Disease

## 2016-08-15 ENCOUNTER — Encounter: Payer: Self-pay | Admitting: Pulmonary Disease

## 2016-08-15 ENCOUNTER — Other Ambulatory Visit (INDEPENDENT_AMBULATORY_CARE_PROVIDER_SITE_OTHER): Payer: Medicare Other

## 2016-08-15 VITALS — BP 114/70 | HR 63 | Ht 61.0 in | Wt 116.0 lb

## 2016-08-15 DIAGNOSIS — E785 Hyperlipidemia, unspecified: Secondary | ICD-10-CM

## 2016-08-15 DIAGNOSIS — J84112 Idiopathic pulmonary fibrosis: Secondary | ICD-10-CM

## 2016-08-15 LAB — PULMONARY FUNCTION TEST
DL/VA % pred: 73 %
DL/VA: 3.1 ml/min/mmHg/L
DLCO unc % pred: 41 %
DLCO unc: 7.76 ml/min/mmHg
FEF 25-75 Post: 1.22 L/sec
FEF 25-75 Pre: 0.94 L/sec
FEF2575-%Change-Post: 30 %
FEF2575-%Pred-Post: 95 %
FEF2575-%Pred-Pre: 73 %
FEV1-%CHANGE-POST: 7 %
FEV1-%PRED-POST: 70 %
FEV1-%PRED-PRE: 66 %
FEV1-POST: 1.16 L
FEV1-PRE: 1.08 L
FEV1FVC-%CHANGE-POST: 3 %
FEV1FVC-%Pred-Pre: 105 %
FEV6-%Change-Post: 3 %
FEV6-%PRED-PRE: 66 %
FEV6-%Pred-Post: 68 %
FEV6-PRE: 1.39 L
FEV6-Post: 1.44 L
FEV6FVC-%Pred-Post: 106 %
FEV6FVC-%Pred-Pre: 106 %
FVC-%CHANGE-POST: 3 %
FVC-%PRED-PRE: 62 %
FVC-%Pred-Post: 64 %
FVC-POST: 1.44 L
FVC-Pre: 1.39 L
POST FEV1/FVC RATIO: 81 %
POST FEV6/FVC RATIO: 100 %
PRE FEV6/FVC RATIO: 100 %
Pre FEV1/FVC ratio: 78 %
RV % PRED: 75 %
RV: 1.62 L
TLC % pred: 67 %
TLC: 2.99 L

## 2016-08-15 LAB — BASIC METABOLIC PANEL
BUN: 15 mg/dL (ref 6–23)
CALCIUM: 9.7 mg/dL (ref 8.4–10.5)
CO2: 30 meq/L (ref 19–32)
CREATININE: 0.64 mg/dL (ref 0.40–1.20)
Chloride: 101 mEq/L (ref 96–112)
GFR: 95.36 mL/min (ref >60.00)
Glucose, Bld: 99 mg/dL (ref 70–99)
Potassium: 3.6 mEq/L (ref 3.5–5.1)
Sodium: 139 mEq/L (ref 135–145)

## 2016-08-15 LAB — HEPATIC FUNCTION PANEL
ALT: 9 U/L (ref 0–35)
AST: 13 U/L (ref 0–37)
Albumin: 3.7 g/dL (ref 3.5–5.2)
Alkaline Phosphatase: 47 U/L (ref 39–117)
BILIRUBIN DIRECT: 0.1 mg/dL (ref 0.0–0.3)
TOTAL PROTEIN: 7.2 g/dL (ref 6.0–8.3)
Total Bilirubin: 0.4 mg/dL (ref 0.2–1.2)

## 2016-08-15 NOTE — Progress Notes (Signed)
Subjective:    Patient ID: Carmen Duncan, female    DOB: 01/14/1941, 76 y.o.   MRN: [id number]  HPI  76yo never smoker with asthma, HTNfor follow-up of IPF Her son [name] works for [employer] In 2017, she was diagnosed with breast cancer, underwent lumpectomy and radiation Started on [name] 11/2015  08/15/2016  Chief Complaint  Patient presents with  . Follow-up    PFT results. Pt states breathing is about the same since last visit.     Accompanied by her son, dyspnea is stable she is able to walk for short distances without shortness of breath, no pedal edema. Cough is stable. She continues to have significant nausea about twice a week and is to take Zofran, no abdominal pain or change in bowel habits Is taking twice a day  [name] 100 LFTs were normal 03/2016   Significant tests/ events  Swallow eval neg for aspiration  03/2014 echo >> grade 2 diastolic dysfunction with fluid overload and elevated BNP  09/2014 HRCT chest - resolution of aspdz  Peripheral and basilar predominant pattern of subpleural reticulation, ground-glass, traction bronchiolectasis   08/2015 HRCT -worse honeycombing  Labs 08/2014 showed ESR at 23, Hypsensiivity panel neg ,  ANA positive but titer neg , CCP neg, hypersens panel neg .   PFT 09/2014 showed mild to mod restriction FVC at 60%,  FEV1 at 64%, ratio 79, with low DLCO at 50%  PFTs 07/2016-lung volumes stable at 62%, DLCO dropped to 40%   Review of Systems neg for any significant sore throat, dysphagia, itching, sneezing, nasal congestion or excess/ purulent secretions, fever, chills, sweats, unintended wt loss, pleuritic or exertional cp, hempoptysis, orthopnea pnd or change in chronic leg swelling  . Also denies presyncope, palpitations, heartburn, abdominal pain, nausea, vomiting, diarrhea or change in bowel or urinary habits, dysuria,hematuria, rash, arthralgias, visual complaints, headache, numbness weakness or ataxia.       Objective:   Physical Exam  Gen. Pleasant, well-nourished, in no distress ENT - no lesions, no post nasal drip Neck: No JVD, no thyromegaly, no carotid bruits Lungs: no use of accessory muscles, no dullness to percussion, bibasal 1/2 rales, no rhonchi  Cardiovascular: Rhythm regular, heart sounds  normal, no murmurs or gallops, no peripheral edema Musculoskeletal: No deformities, no cyanosis or clubbing        Assessment & Plan:

## 2016-08-15 NOTE — Progress Notes (Signed)
PFT done today. 

## 2016-08-15 NOTE — Assessment & Plan Note (Signed)
She is on 2 drugs that could be potentially hepatotoxic Hence check LFTs every 3 months She will take Zofran as needed for nausea

## 2016-08-15 NOTE — Assessment & Plan Note (Signed)
There has been a drop in her diffusion capacity from 50-40% but she is symptomatically stable  Check liver function tests today & q 3 months Continue on [name] - call us if nausea is worse

## 2016-08-15 NOTE — Patient Instructions (Signed)
Check liver function tests today Lung function is stable. Continue on [name] - call us if nausea is worse

## 2016-09-03 ENCOUNTER — Ambulatory Visit (INDEPENDENT_AMBULATORY_CARE_PROVIDER_SITE_OTHER): Payer: Medicare Other | Admitting: Family Medicine

## 2016-09-03 ENCOUNTER — Encounter: Payer: Self-pay | Admitting: Family Medicine

## 2016-09-03 VITALS — BP 110/70 | HR 59 | Temp 97.6°F | Wt 118.1 lb

## 2016-09-03 DIAGNOSIS — R634 Abnormal weight loss: Secondary | ICD-10-CM

## 2016-09-03 DIAGNOSIS — R112 Nausea with vomiting, unspecified: Secondary | ICD-10-CM

## 2016-09-03 NOTE — Progress Notes (Signed)
Pre visit review using our clinic review tool, if applicable. No additional management support is needed unless otherwise documented below in the visit note. 

## 2016-09-03 NOTE — Progress Notes (Signed)
Subjective:     Patient ID: Carmen Duncan, female   DOB: 1941/04/21, 76 y.o.   MRN: [id number]  HPI Patient seen with several month history of intermittent nausea and vomiting. She's had rare episodes of diarrhea. Her chronic problems include history of cerebrovascular disease, essential tremor, pulmonary hypertension, idiopathic pulmonary fibrosis, IBS, hypertension, chronic diastolic heart failure, remote history of breast cancer. Her symptoms are somewhat sporadic but have been occurring more frequently. She had one actually earlier today while getting ready to come here and also recalls having 1 this past Sunday and Friday.  No chest pain.  She has lost some weight with current weight of 118 pounds down from 127 pounds in August. Last summer she started drug for her poor fibrosis called [name]-and diarrhea, nausea, vomiting, and weight loss can all be associated with that medication.  She has not had any associated abdominal pain. No consistent headaches. No consistent postprandial nausea or vomiting. Previous cholecystectomy. Never vomited any blood. Appetite is fair. No clear triggering factors-or any pattern to her symptoms. Recent basic metabolic panel and hepatic panel on 08/15/16 were normal. Recent albumin 3.7. No known history of gastroparesis. No history of peptic ulcer disease.  Wt Readings from Last 3 Encounters:  09/03/16 118 lb 1.6 oz (53.6 kg)  08/15/16 116 lb (52.6 kg)  03/29/16 124 lb 9.6 oz (56.5 kg)     Past Medical History:  Diagnosis Date  . Acoustic neuroma ([place])   . Anxiety   . Arthritis   . Asthma   . Brain cancer ([place])   . Breast cancer of upper-outer quadrant of right female breast ([place]) 07/19/2015  . Diverticulosis   . Ejection fraction   . Essential tremor    on inderal  . GERD (gastroesophageal reflux disease)   . HOH (hard of hearing) left ear  . Hyperlipidemia   . Hypertension   . IBS (irritable bowel syndrome)   . Interstitial lung disease ([place])   .  Pneumonia    04-2014, CAP  . Stroke [facility])    no deficits, on plavix   Past Surgical History:  Procedure Laterality Date  . ABDOMINAL HYSTERECTOMY  1980  . BIOPSY BREAST    . BREAST LUMPECTOMY WITH RADIOACTIVE SEED AND SENTINEL LYMPH NODE BIOPSY Right 08/10/2015   Procedure: BREAST LUMPECTOMY WITH RADIOACTIVE SEED AND SENTINEL LYMPH NODE BIOPSY;  Surgeon: Autumn Messing III, MD;  Location: [place];  Service: General;  Laterality: Right;  . CHOLECYSTECTOMY  2011  . EP IMPLANTABLE DEVICE N/A 11/16/2015   Procedure: Loop Recorder Insertion;  Surgeon: [name], MD;  Location: [place] CV LAB;  Service: Cardiovascular;  Laterality: N/A;  . eyes  2007   cararacts  . TEE WITHOUT CARDIOVERSION N/A 09/05/2014   Procedure: TRANSESOPHAGEAL ECHOCARDIOGRAM (TEE);  Surgeon: Lelon Perla, MD;  Location: [facility] ENDOSCOPY;  Service: Cardiovascular;  Laterality: N/A;  . TEE WITHOUT CARDIOVERSION N/A 11/16/2015   Procedure: TRANSESOPHAGEAL ECHOCARDIOGRAM (TEE);  Surgeon: Larey Dresser, MD;  Location: [place];  Service: Cardiovascular;  Laterality: N/A;  . TONSILLECTOMY  1945    reports that she has never smoked. She has never used smokeless tobacco. She reports that she drinks alcohol. She reports that she does not use drugs. family history includes Heart disease (age of onset: 14) in her father; Hyperlipidemia in her father and mother; Hypertension in her mother; Lung cancer in her paternal uncle; Stroke in her father. Allergies  Allergen Reactions  . Latex Itching, Rash and  Other (See Comments)    Red angry skin from latex tape  . Sulfonamide Derivatives Rash    hives  . Tape Itching and Rash    Please use paper tape or coban wrap!!     Review of Systems  Constitutional: Positive for unexpected weight change. Negative for appetite change and fever.  Respiratory: Negative for shortness of breath.   Cardiovascular: Negative for chest pain.  Gastrointestinal: Positive for  nausea and vomiting. Negative for abdominal distention, abdominal pain, blood in stool and constipation.  Endocrine: Negative for polydipsia and polyuria.  Genitourinary: Negative for dysuria.  Neurological: Negative for dizziness, seizures and syncope.  Psychiatric/Behavioral: Negative for confusion.       Objective:   Physical Exam  Constitutional: She appears well-developed and well-nourished.  HENT:  Mouth/Throat: Oropharynx is clear and moist.  Neck: Neck supple. No thyromegaly present.  Cardiovascular: Normal rate and regular rhythm.   Pulmonary/Chest: Effort normal.  Patient has crackles in both bases related to her chronic pulmonary fibrosis  Abdominal: Soft. Bowel sounds are normal. She exhibits no distension and no mass. There is no rebound and no guarding.  Minimally tender right upper quadrant to deep palpation  Musculoskeletal: She exhibits no edema.  Lymphadenopathy:    She has no cervical adenopathy.       Assessment:     Patient has complex past medical history as above who presents with several month history of intermittent nausea and vomiting along with documented 9 pound weight loss since last August.  Etiology unclear. She did start last summer on [name] and above symptoms can be related to that medication though her symptoms are somewhat inconsistent    Plan:     -Consider upper GI to further evaluate -Consider gastric emptying study if the above normal- to rule out gastroparesis. -We've encouraged her to keep a diary of symptoms help track -Continue Zofran as needed for nausea symptoms -we also discussed possible GI referral for consideration for EGD if symptoms persist and she wishes to try to avoid invasive procedures at this time if possible.  [name] MD [facility] at [facility]

## 2016-09-03 NOTE — Patient Instructions (Signed)
We will set up UGI and call you.

## 2016-09-09 ENCOUNTER — Ambulatory Visit
Admission: RE | Admit: 2016-09-09 | Discharge: 2016-09-09 | Disposition: A | Discharge status: Home / Self Care | Payer: Medicare Other | Source: Ambulatory Visit | Attending: Family Medicine | Admitting: Family Medicine

## 2016-09-09 DIAGNOSIS — R634 Abnormal weight loss: Secondary | ICD-10-CM

## 2016-09-09 DIAGNOSIS — R112 Nausea with vomiting, unspecified: Secondary | ICD-10-CM

## 2016-09-09 DIAGNOSIS — K449 Diaphragmatic hernia without obstruction or gangrene: Secondary | ICD-10-CM | POA: Diagnosis not present

## 2016-10-12 ENCOUNTER — Other Ambulatory Visit: Payer: Self-pay | Admitting: Neurology

## 2016-10-15 ENCOUNTER — Ambulatory Visit (INDEPENDENT_AMBULATORY_CARE_PROVIDER_SITE_OTHER): Payer: Medicare Other | Admitting: Family Medicine

## 2016-10-15 ENCOUNTER — Encounter: Payer: Self-pay | Admitting: Family Medicine

## 2016-10-15 VITALS — BP 102/74 | HR 74 | Temp 97.8°F | Wt 119.3 lb

## 2016-10-15 DIAGNOSIS — E538 Deficiency of other specified B group vitamins: Secondary | ICD-10-CM

## 2016-10-15 DIAGNOSIS — Z9181 History of falling: Secondary | ICD-10-CM

## 2016-10-15 DIAGNOSIS — J84112 Idiopathic pulmonary fibrosis: Secondary | ICD-10-CM

## 2016-10-15 DIAGNOSIS — R531 Weakness: Secondary | ICD-10-CM

## 2016-10-15 DIAGNOSIS — R4189 Other symptoms and signs involving cognitive functions and awareness: Secondary | ICD-10-CM | POA: Diagnosis not present

## 2016-10-15 MED ORDER — OMEPRAZOLE 20 MG PO CPDR: 20.0000 mg | DELAYED_RELEASE_CAPSULE | Freq: Every day | ORAL | 3 refills | Status: DC

## 2016-10-15 NOTE — Patient Instructions (Signed)
Fall Prevention in the Home Falls can cause injuries and can affect people from all age groups. There are many simple things that you can do to make your home safe and to help prevent falls. What can I do on the outside of my home?  Regularly repair the edges of walkways and driveways and fix any cracks.  Remove high doorway thresholds.  Trim any shrubbery on the main path into your home.  Use bright outdoor lighting.  Clear walkways of debris and clutter, including tools and rocks.  Regularly check that handrails are securely fastened and in good repair. Both sides of any steps should have handrails.  Install guardrails along the edges of any raised decks or porches.  Have leaves, snow, and ice cleared regularly.  Use sand or salt on walkways during winter months.  In the garage, clean up any spills right away, including grease or oil spills. What can I do in the bathroom?  Use night lights.  Install grab bars by the toilet and in the tub and shower. Do not use towel bars as grab bars.  Use non-skid mats or decals on the floor of the tub or shower.  If you need to sit down while you are in the shower, use a plastic, non-slip stool.  Keep the floor dry. Immediately clean up any water that spills on the floor.  Remove soap buildup in the tub or shower on a regular basis.  Attach bath mats securely with double-sided non-slip rug tape.  Remove throw rugs and other tripping hazards from the floor. What can I do in the bedroom?  Use night lights.  Make sure that a bedside light is easy to reach.  Do not use oversized bedding that drapes onto the floor.  Have a firm chair that has side arms to use for getting dressed.  Remove throw rugs and other tripping hazards from the floor. What can I do in the kitchen?  Clean up any spills right away.  Avoid walking on wet floors.  Place frequently used items in easy-to-reach places.  If you need to reach for something above  you, use a sturdy step stool that has a grab bar.  Keep electrical cables out of the way.  Do not use floor polish or wax that makes floors slippery. If you have to use wax, make sure that it is non-skid floor wax.  Remove throw rugs and other tripping hazards from the floor. What can I do in the stairways?  Do not leave any items on the stairs.  Make sure that there are handrails on both sides of the stairs. Fix handrails that are broken or loose. Make sure that handrails are as long as the stairways.  Check any carpeting to make sure that it is firmly attached to the stairs. Fix any carpet that is loose or worn.  Avoid having throw rugs at the top or bottom of stairways, or secure the rugs with carpet tape to prevent them from moving.  Make sure that you have a light switch at the top of the stairs and the bottom of the stairs. If you do not have them, have them installed. What are some other fall prevention tips?  Wear closed-toe shoes that fit well and support your feet. Wear shoes that have rubber soles or low heels.  When you use a stepladder, make sure that it is completely opened and that the sides are firmly locked. Have someone hold the ladder while you are using   it. Do not climb a closed stepladder.  Add color or contrast paint or tape to grab bars and handrails in your home. Place contrasting color strips on the first and last steps.  Use mobility aids as needed, such as canes, walkers, scooters, and crutches.  Turn on lights if it is dark. Replace any light bulbs that burn out.  Set up furniture so that there are clear paths. Keep the furniture in the same spot.  Fix any uneven floor surfaces.  Choose a carpet design that does not hide the edge of steps of a stairway.  Be aware of any and all pets.  Review your medicines with your healthcare provider. Some medicines can cause dizziness or changes in blood pressure, which increase your risk of falling. Talk with  your health care provider about other ways that you can decrease your risk of falls. This may include working with a physical therapist or trainer to improve your strength, balance, and endurance. This information is not intended to replace advice given to you by your health care provider. Make sure you discuss any questions you have with your health care provider. Document Released: 05/03/2002 Document Revised: 10/10/2015 Document Reviewed: 06/17/2014 Elsevier Interactive Patient Education  2017 [employer].  We will set up physical therapy consult.

## 2016-10-15 NOTE — Progress Notes (Signed)
Subjective:     Patient ID: Carmen Duncan, female   DOB: 23-Jul-1940, 76 y.o.   MRN: [id number]  HPI Patient is here accompanied by her son and daughter-in-law to discuss several items. She has multiple chronic problems including hypertension, history of CVA, diastolic heart failure, idiopathic pulmonary fibrosis, history of asthma, irritable bowel syndrome, GERD, question of vascular dementia, essential tremor, history of B12 deficiency, history of breast cancer.   She had some recent issues with weight loss and intermittent nausea without vomiting. We obtained upper GI series which showed small hiatal hernia but otherwise no acute abnormalities. She still has infrequent episodes of nausea with no clear precise triggers. No vomiting. Her weight is up a pound from last visit. Son states that she frequently does not take time to eat and frequently skips breakfast.  No recent abdominal pain.  No dysphagia.  On chronic Prilosec.  Has had occasional GERD symptoms improved when taking Prilosec regularly.  She has become more debilitated in past couple years.  No recent falls. She ambulates without assistance. She's had prior physical therapy but not recently. They have concerns that her bedroom is upstairs and she has to negotiate stairs frequently.  Son expressed concerns earlier that she is becoming more forgetful. She seems confused at times and sometimes has made references to him working occupation that her husband had. She seems to repeat her self more often. She had neuro psychological testing through neurology and concern for some vascular dementia. She has history of B12 deficiency but currently not on replacement. She's not had thyroid checked in a couple of years. No behavioral disturbance. She was placed on Lexapro 10 mg daily but this has not seemed to help with any cognitive impairments  She feels her respiratory status has been stable. She has some dyspnea with exertion but not at rest.  Past  Medical History:  Diagnosis Date  . Acoustic neuroma ([place])   . Anxiety   . Arthritis   . Asthma   . Brain cancer ([place])   . Breast cancer of upper-outer quadrant of right female breast ([place]) 07/19/2015  . Diverticulosis   . Ejection fraction   . Essential tremor    on inderal  . GERD (gastroesophageal reflux disease)   . HOH (hard of hearing) left ear  . Hyperlipidemia   . Hypertension   . IBS (irritable bowel syndrome)   . Interstitial lung disease ([place])   . Pneumonia    04-2014, CAP  . Stroke [facility])    no deficits, on plavix   Past Surgical History:  Procedure Laterality Date  . ABDOMINAL HYSTERECTOMY  1980  . BIOPSY BREAST    . BREAST LUMPECTOMY WITH RADIOACTIVE SEED AND SENTINEL LYMPH NODE BIOPSY Right 08/10/2015   Procedure: BREAST LUMPECTOMY WITH RADIOACTIVE SEED AND SENTINEL LYMPH NODE BIOPSY;  Surgeon: Autumn Messing III, MD;  Location: [place];  Service: General;  Laterality: Right;  . CHOLECYSTECTOMY  2011  . EP IMPLANTABLE DEVICE N/A 11/16/2015   Procedure: Loop Recorder Insertion;  Surgeon: [name], MD;  Location: [place] CV LAB;  Service: Cardiovascular;  Laterality: N/A;  . eyes  2007   cararacts  . TEE WITHOUT CARDIOVERSION N/A 09/05/2014   Procedure: TRANSESOPHAGEAL ECHOCARDIOGRAM (TEE);  Surgeon: Lelon Perla, MD;  Location: [facility] ENDOSCOPY;  Service: Cardiovascular;  Laterality: N/A;  . TEE WITHOUT CARDIOVERSION N/A 11/16/2015   Procedure: TRANSESOPHAGEAL ECHOCARDIOGRAM (TEE);  Surgeon: Larey Dresser, MD;  Location: [place];  Service: Cardiovascular;  Laterality: N/A;  . TONSILLECTOMY  1945    reports that she has never smoked. She has never used smokeless tobacco. She reports that she drinks alcohol. She reports that she does not use drugs. family history includes Heart disease (age of onset: 67) in her father; Hyperlipidemia in her father and mother; Hypertension in her mother; Lung cancer in her paternal uncle; Stroke in her  father. Allergies  Allergen Reactions  . Latex Itching, Rash and Other (See Comments)    Red angry skin from latex tape  . Sulfonamide Derivatives Rash    hives  . Tape Itching and Rash    Please use paper tape or coban wrap!!      Review of Systems  Constitutional: Positive for fatigue. Negative for appetite change and unexpected weight change.  Eyes: Negative for visual disturbance.  Respiratory: Positive for shortness of breath. Negative for cough, chest tightness and wheezing.   Cardiovascular: Negative for chest pain, palpitations and leg swelling.  Gastrointestinal: Negative for abdominal pain.  Genitourinary: Negative for dysuria.  Musculoskeletal: Negative for back pain.  Neurological: Negative for dizziness, seizures, syncope, weakness, light-headedness and headaches.  Psychiatric/Behavioral: Positive for confusion. Negative for agitation and hallucinations.       Objective:   Physical Exam  Constitutional: She is oriented to person, place, and time. She appears well-developed and well-nourished.  Cardiovascular: Normal rate and regular rhythm.   Pulmonary/Chest: Effort normal and breath sounds normal. No respiratory distress. She has no wheezes. She has no rales.  Abdominal: Soft. She exhibits no distension and no mass. There is no tenderness. There is no rebound and no guarding.  Musculoskeletal: She exhibits no edema.  Neurological: She is alert and oriented to person, place, and time. No cranial nerve deficit.  Psychiatric:  MMSE 28/30       Assessment:     #1 cognitive impairment. Question related to vascular dementia.  #2 increased risk of falls  #3 history of B12 deficiency  #4 history of intermittent nausea. Recent upper GI results as above. ?GERD related and symptoms improved at this time.  #5 recent weight loss which seemed to have stabilized    Plan:     -Check labs with TSH and B12 level -Continue close follow-up with neurology -Set up home  physical therapy and occupational therapy assessment -We discussed importance of not skipping meals. We also discussed possible supplementation with something like Boost -continue with Prilosec and be in touch if nausea recurs -over 40 minutes spent of which > 50% in direct assessment and counseling regarding fall risk, nutrition, medications, and indications for home PT and OT assessment.  [name] MD [facility] at [facility]

## 2016-10-16 LAB — TSH: TSH: 2 u[IU]/mL (ref 0.35–4.50)

## 2016-10-16 LAB — VITAMIN B12: Vitamin B-12: 265 pg/mL (ref 211–911)

## 2016-10-30 ENCOUNTER — Telehealth: Payer: Self-pay | Admitting: Pulmonary Disease

## 2016-10-30 NOTE — Telephone Encounter (Signed)
PA information received.  Called [place] with [facility] [phone]   Called [place] [phone] to initiate PA, opt 5 Patient ID # [id number] Spoke with Vikki Ports, renewal completed for [name] 100mg , 1 po BID, #60 for 30 day, #180 for 90 day Dx: IPF Z85.885 Sent over to Clinical review team Will send to [place] to follow up.

## 2016-11-01 NOTE — Telephone Encounter (Signed)
cherina please update the chart once the PA has been received back with decision.  thanks

## 2016-11-01 NOTE — Telephone Encounter (Signed)
Still waiting on response

## 2016-11-04 DIAGNOSIS — D0511 Intraductal carcinoma in situ of right breast: Secondary | ICD-10-CM | POA: Diagnosis not present

## 2016-11-04 NOTE — Telephone Encounter (Signed)
Per CMM, the medication was rejected by the insurance. [employer] was contacted and Clare Gandy stated the medication was denied being the application stated the patient had more than a 10% decline in her FVC. While reviewing RA last OV, unable to confirm percentage of change in patient's FVC. Was informed by representative to confirm percentage change so an appeal process can be started or request alternatives.   RA, please advise on the percentage change in the pt's FVC so we can request an appeal or medication alternatives. Thank you.   Will route to Cherina to follow.

## 2016-11-05 ENCOUNTER — Ambulatory Visit: Payer: Medicare Other | Admitting: Oncology

## 2016-11-05 NOTE — Telephone Encounter (Signed)
Cherina, do you have the appeals information? I don't see the point in calling in a refill until we get the appeal done. Please advise thanks!

## 2016-11-05 NOTE — Telephone Encounter (Signed)
Carmen Duncan with [facility] calling stating they need a new script for [name] 100 mg 1 cap by mouth twice daily, 12 hr apart, take with food. States other prescription has expired. CB is [phone], opt 2, opt 1 to speak to pharmacist.  Fax [phone].

## 2016-11-05 NOTE — Telephone Encounter (Signed)
Lung volumes stable , DLCO dropped from 10.23 (50% ) in 09/2014 to 7.76 (41%) in 07/2016 - a change of 9% in 2 years OK to send appeal

## 2016-11-05 NOTE — Telephone Encounter (Signed)
Never received forms on this patient.

## 2016-11-06 NOTE — Telephone Encounter (Signed)
Called [employer] and after 20 min they gave the mailing address for appeal letter  Appeal letter has to be mailed to [facility] and [place] [address] [place] [address] Will forward to RA to write appeal letter

## 2016-11-06 NOTE — Telephone Encounter (Signed)
Per chart, no appeal letter has been typed for pt.   RA please advise on what info needs to be included for pt's appeal letter for [name].  Thanks.

## 2016-11-06 NOTE — Telephone Encounter (Signed)
Message will be routed to Triage to follow up as I am working in PFTs and can't call the insurance company.

## 2016-11-06 NOTE — Telephone Encounter (Signed)
Returned call a/b appeal letter nd I let them know that it has already been sent.Carmen Duncan

## 2016-11-07 NOTE — Telephone Encounter (Signed)
Okay to send up a letter saying -Is under our care for idiopathic pulmonary fibrosis, confirmed on high-resolution CT chest. She has tolerated ofev well with minimal side effects. Review of her PFTs show that Lung volumes stable , DLCO dropped from 10.23 (50% ) in 09/2014 to 7.76 (41%) in 07/2016 - a change of 9% in 2 years.  Would request that [name] be approved

## 2016-11-07 NOTE — Telephone Encounter (Signed)
Received a RX request from [place] for [name] 100mg  since the current RX has expired.  Will place RX in RA's look-at folder.   RA, please advise on the appeal letter as well.

## 2016-11-08 ENCOUNTER — Encounter: Payer: Self-pay | Admitting: *Deleted

## 2016-11-08 NOTE — Telephone Encounter (Signed)
Appeal letter was created and mailed to the address stated below  Will forward back to [place] to keep an eye out for decision

## 2016-11-08 NOTE — Telephone Encounter (Signed)
[facility] called to leave [facility] fax # [phone] for the appeal dept.Marland Kitchenert

## 2016-11-14 ENCOUNTER — Telehealth: Payer: Self-pay | Admitting: Pulmonary Disease

## 2016-11-14 NOTE — Telephone Encounter (Signed)
lmtcb for [employer].

## 2016-11-15 NOTE — Telephone Encounter (Signed)
Magda Paganini returned phone call...ert

## 2016-11-15 NOTE — Telephone Encounter (Signed)
Called Magda Paganini at [employer] letter received - requesting actual PFT results (2016-present) be faxed.  Fax # [phone] ATTN: Magda Paganini  These have been faxed and printed  Will hold in triage to follow up this afternoon.

## 2016-11-18 NOTE — Telephone Encounter (Signed)
Left a message for Magda Paganini to see if anything else was needed so that we can close this message.

## 2016-11-19 NOTE — Telephone Encounter (Signed)
Spoke with Magda Paganini at [employer]. This appeal has been approved through 10/30/2017. Nothing further was needed at this time.

## 2016-11-21 ENCOUNTER — Encounter: Payer: Self-pay | Admitting: Family Medicine

## 2016-11-21 NOTE — Telephone Encounter (Signed)
I called [facility] at [phone] and spoke with [facility].  Loma Sousa stated she does not see any information in their system regarding the pt and will send an email to [place] and call our office back.

## 2016-11-22 NOTE — Telephone Encounter (Signed)
Carmen Duncan came in to the office, apologized and stated he will work on setting this up for the pt.

## 2016-11-23 DIAGNOSIS — R531 Weakness: Secondary | ICD-10-CM | POA: Diagnosis not present

## 2016-11-23 DIAGNOSIS — I5032 Chronic diastolic (congestive) heart failure: Secondary | ICD-10-CM | POA: Diagnosis not present

## 2016-11-23 DIAGNOSIS — I7300 Raynaud's syndrome without gangrene: Secondary | ICD-10-CM | POA: Diagnosis not present

## 2016-11-23 DIAGNOSIS — I110 Hypertensive heart disease with heart failure: Secondary | ICD-10-CM | POA: Diagnosis not present

## 2016-11-23 DIAGNOSIS — F0150 Vascular dementia without behavioral disturbance: Secondary | ICD-10-CM | POA: Diagnosis not present

## 2016-11-25 ENCOUNTER — Telehealth: Payer: Self-pay | Admitting: Family Medicine

## 2016-11-25 NOTE — Telephone Encounter (Signed)
Carmen Duncan would like a verbal for home health PT 1 wk/ 1 2 wk /2 0 wk/ 1 (pt on vacation) 2 wk / 1 1 wk / 2 For strengthening, balance, gait training, home safety, home exercise program.  Also, they did not receive any history info on the pt. When you call with verbal, he would like to know if pt has any dementia associated from past stroke, any anxiety, or depression.

## 2016-11-25 NOTE — Telephone Encounter (Signed)
OK to continue home PT.  Maybe we could send a copy of her problem list.  She has mild cognitive impairment- possibly related to cerebrovascular disease.

## 2016-11-26 NOTE — Telephone Encounter (Signed)
Problem list faxed and confirmed

## 2016-11-26 NOTE — Telephone Encounter (Signed)
Verbal orders given.  Carmen Duncan will call back with a fax number to fax a copy of the problem list

## 2016-11-26 NOTE — Telephone Encounter (Signed)
Lestine Mount states ok to fax to  [phone]

## 2016-12-10 ENCOUNTER — Other Ambulatory Visit: Payer: Self-pay | Admitting: Neurology

## 2016-12-19 ENCOUNTER — Ambulatory Visit (HOSPITAL_BASED_OUTPATIENT_CLINIC_OR_DEPARTMENT_OTHER): Payer: Medicare Other | Admitting: Oncology

## 2016-12-19 VITALS — BP 129/67 | HR 70 | Temp 97.4°F | Resp 18 | Ht 61.0 in | Wt 114.0 lb

## 2016-12-19 DIAGNOSIS — Z171 Estrogen receptor negative status [ER-]: Secondary | ICD-10-CM | POA: Diagnosis not present

## 2016-12-19 DIAGNOSIS — Z86000 Personal history of in-situ neoplasm of breast: Secondary | ICD-10-CM

## 2016-12-19 DIAGNOSIS — C50411 Malignant neoplasm of upper-outer quadrant of right female breast: Secondary | ICD-10-CM

## 2016-12-19 NOTE — Progress Notes (Signed)
[facility]  [phone] [phone]     ID: Carmen Duncan DOB: 04-14-1941  MR#: [id number]  TDV#:[id number]  Patient Care Team: [name], MD as PCP - General Magrinat, Virgie Dad, MD as Consulting Physician (Oncology) Jovita Kussmaul, MD as Consulting Physician (General Surgery) Kyung Rudd, MD as Consulting Physician (Radiation Oncology) Rigoberto Noel, MD as Consulting Physician (Pulmonary Disease) PCP: [name], MD GYN: OTHER MD:  CHIEF COMPLAINT: Noninvasive breast cancer, estrogen receptor negative  CURRENT TREATMENT: observation   BREAST CANCER HISTORY: From the original intake note:  Carmen Duncan had routine screening mammography at [facility] 07/06/2015 showing a new group of pleomorphic calcifications in the right breast 11:00 position. This measured 2.7 cm. She was recalled for right diagnostic mammography every 16 2017, which showed the breast density to be category C. This confirmed an area of dystrophic calcification in the right breast 11:00 position. Biopsy of this area on the very 20 2017 showed ([id number]) ductal carcinoma in situ, high-grade, estrogen and progesterone receptor negative.  Her subsequent history is as detailed below.  INTERVAL HISTORY: Carmen Duncan returns today for follow-up of her estrogen receptor negative noninvasive breast cancer accompanied by her son and daughter-in-law. The interval history is generally unremarkable. She has made a good recovery from her breast surgery. Currently she denies any symptoms from her interstitial lung disease other than mild shortness of breath with increased activity. She has established herself with Dr. Elsworth Soho regarding that problem.  REVIEW OF SYSTEMS: Carmen Duncan currently denies cough, pleurisy, or worsening shortness of breath. She denies chest pain or any chest concerns. In fact a detailed review of systems today was benign.  PAST MEDICAL HISTORY: Past Medical History:  Diagnosis  Date  . Acoustic neuroma ([place])   . Anxiety   . Arthritis   . Asthma   . Brain cancer ([place])   . Breast cancer of upper-outer quadrant of right female breast ([place]) 07/19/2015  . Diverticulosis   . Ejection fraction   . Essential tremor    on inderal  . GERD (gastroesophageal reflux disease)   . HOH (hard of hearing) left ear  . Hyperlipidemia   . Hypertension   . IBS (irritable bowel syndrome)   . Interstitial lung disease ([place])   . Pneumonia    04-2014, CAP  . Stroke ([place])    no deficits, on plavix    PAST SURGICAL HISTORY: Past Surgical History:  Procedure Laterality Date  . ABDOMINAL HYSTERECTOMY  1980  . BIOPSY BREAST    . BREAST LUMPECTOMY WITH RADIOACTIVE SEED AND SENTINEL LYMPH NODE BIOPSY Right 08/10/2015   Procedure: BREAST LUMPECTOMY WITH RADIOACTIVE SEED AND SENTINEL LYMPH NODE BIOPSY;  Surgeon: Autumn Messing III, MD;  Location: [facility];  Service: General;  Laterality: Right;  . CHOLECYSTECTOMY  2011  . EP IMPLANTABLE DEVICE N/A 11/16/2015   Procedure: Loop Recorder Insertion;  Surgeon: [name], MD;  Location: [facility];  Service: Cardiovascular;  Laterality: N/A;  . eyes  2007   cararacts  . TEE WITHOUT CARDIOVERSION N/A 09/05/2014   Procedure: TRANSESOPHAGEAL ECHOCARDIOGRAM (TEE);  Surgeon: Lelon Perla, MD;  Location: [facility] ENDOSCOPY;  Service: Cardiovascular;  Laterality: N/A;  . TEE WITHOUT CARDIOVERSION N/A 11/16/2015   Procedure: TRANSESOPHAGEAL ECHOCARDIOGRAM (TEE);  Surgeon: Larey Dresser, MD;  Location[facility] ENDOSCOPY;  Service: Cardiovascular;  Laterality: N/A;  . TONSILLECTOMY  1945    FAMILY HISTORY Family History  Problem Relation Age of Onset  .  Hyperlipidemia Mother   . Hypertension Mother   . Hyperlipidemia Father   . Heart disease Father 36  . Stroke Father   . Lung cancer Paternal Uncle        x 4  The patient's father died at the age of 15 from heart related problems. The patient's mother died at age 72, cause of  death being unclear to the patient. Cayla had no brothers, 1 sister. There is significant lung cancer on the paternal side, and several uncles all of whom were smokers. There is one cousin on the paternal side was diagnosed with cancer in her 4s , primary site unknown to the patient  GYNECOLOGIC HISTORY:  No LMP recorded. Patient has had a hysterectomy. Menarche age 89, first live birth age 73. The patient is GX P1. She is status post hysterectomy but no salpingo-oophorectomy. She used hormone replacement for some time but does not recall how long. 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 She is widowed and lives with her son Jacqulyn Cane, who is a Software engineer and works for our system. He is also on Scientist, physiological. The patient has 3 grandchildren. She attends a [employer]    ADVANCED DIRECTIVES: In place   HEALTH MAINTENANCE: Social History  Substance Use Topics  . Smoking status: Never Smoker  . Smokeless tobacco: Never Used  . Alcohol use 0.0 oz/week     Comment: wine about 2 times a week      Colonoscopy: NOV 2009  PAP: s/p hysterectomy  Bone density:  Lipid panel:  Allergies  Allergen Reactions  . Latex Itching, Rash and Other (See Comments)    Red angry skin from latex tape  . Sulfonamide Derivatives Rash    hives  . Tape Itching and Rash    Please use paper tape or coban wrap!!    Current Outpatient Prescriptions  Medication Sig Dispense Refill  . acetaminophen (TYLENOL) 325 MG tablet Take 325 mg by mouth every 6 (six) hours as needed for headache.     [name] amLODipine (NORVASC) 2.5 MG tablet TAKE ONE TABLET BY MOUTH ONCE DAILY 90 tablet 2  . atorvastatin (LIPITOR) 20 MG tablet Take 1 tablet (20 mg total) by mouth daily. 30 tablet 11  . clidinium-chlordiazePOXIDE (LIBRAX) 5-2.5 MG capsule Take 1 capsule by mouth 2 (two) times daily as needed. (Patient taking  differently: Take 1 capsule by mouth at bedtime. ) 60 capsule 3  . clopidogrel (PLAVIX) 75 MG tablet TAKE ONE TABLET BY MOUTH ONCE DAILY 30 tablet 5  . escitalopram (LEXAPRO) 10 MG tablet TAKE ONE TABLET BY MOUTH ONCE DAILY 90 tablet 1  . Nintedanib ([name]) 100 MG CAPS Take 100 mg by mouth 2 (two) times daily.    [name] omeprazole (PRILOSEC) 20 MG capsule Take 1 capsule (20 mg total) by mouth daily. 90 capsule 3  . ondansetron (ZOFRAN) 4 MG tablet Take 1 tablet (4 mg total) by mouth every 8 (eight) hours as needed for nausea or vomiting. 30 tablet 0  . propranolol (INDERAL) 20 MG tablet TAKE 1 TABLET BY MOUTH ONCE DAILY 90 tablet 0   No current facility-administered medications for this visit.     OBJECTIVE: Older white womanWho appears stated age  76:   12/19/16 1225  BP: 129/67  Pulse: 70  Resp: 18  Temp: (!) 97.4 F (36.3 C)  Body mass index is 21.54 kg/m.    ECOG FS:1 - Symptomatic but completely ambulatory  Sclerae unicteric, EOMs intact Oropharynx clear and moist No cervical or supraclavicular adenopathy Lungs no rales or rhonchi Heart regular rate and rhythm Abd soft, nontender, positive bowel sounds MSK no focal spinal tenderness, no upper extremity lymphedema Neuro: nonfocal, well oriented, appropriate affect Breasts: The right breast has undergone lumpectomy and radiation. There is no evidence of local recurrence. The left breast is benign. Both axillae are benign.  LAB RESULTS:  CMP     Component Value Date/Time   NA 139 08/15/2016 1638   NA 139 07/26/2015 1251   K 3.6 08/15/2016 1638   K 4.8 07/26/2015 1251   CL 101 08/15/2016 1638   CL 103 05/03/2014 0630   CO2 30 08/15/2016 1638   CO2 28 07/26/2015 1251   GLUCOSE 99 08/15/2016 1638   GLUCOSE 86 07/26/2015 1251   BUN 15 08/15/2016 1638   BUN 13.3 07/26/2015 1251   CREATININE 0.64 08/15/2016 1638   CREATININE 0.8 07/26/2015 1251   CALCIUM 9.7 08/15/2016 1638   CALCIUM 9.4 07/26/2015 1251   PROT 7.2  08/15/2016 1638   PROT 7.7 07/26/2015 1251   ALBUMIN 3.7 08/15/2016 1638   ALBUMIN 3.3 (L) 07/26/2015 1251   AST 13 08/15/2016 1638   AST 20 07/26/2015 1251   ALT 9 08/15/2016 1638   ALT 13 07/26/2015 1251   ALKPHOS 47 08/15/2016 1638   ALKPHOS 79 07/26/2015 1251   BILITOT 0.4 08/15/2016 1638   BILITOT 0.37 07/26/2015 1251   GFRNONAA >60 12/11/2015 1152   GFRNONAA >60 05/03/2014 0630   GFRAA >60 12/11/2015 1152   GFRAA >60 05/03/2014 0630    INo results found for: SPEP, UPEP  Lab Results  Component Value Date   WBC 8.0 12/11/2015   NEUTROABS 6.5 12/11/2015   HGB 14.7 12/11/2015   HCT 44.2 12/11/2015   MCV 91.7 12/11/2015   PLT 212 12/11/2015      Chemistry      Component Value Date/Time   NA 139 08/15/2016 1638   NA 139 07/26/2015 1251   K 3.6 08/15/2016 1638   K 4.8 07/26/2015 1251   CL 101 08/15/2016 1638   CL 103 05/03/2014 0630   CO2 30 08/15/2016 1638   CO2 28 07/26/2015 1251   BUN 15 08/15/2016 1638   BUN 13.3 07/26/2015 1251   CREATININE 0.64 08/15/2016 1638   CREATININE 0.8 07/26/2015 1251      Component Value Date/Time   CALCIUM 9.7 08/15/2016 1638   CALCIUM 9.4 07/26/2015 1251   ALKPHOS 47 08/15/2016 1638   ALKPHOS 79 07/26/2015 1251   AST 13 08/15/2016 1638   AST 20 07/26/2015 1251   ALT 9 08/15/2016 1638   ALT 13 07/26/2015 1251   BILITOT 0.4 08/15/2016 1638   BILITOT 0.37 07/26/2015 1251       No results found for: LABCA2  No components found for: LABCA125  No results for input(s): INR in the last 168 hours.  Urinalysis    Component Value Date/Time   COLORURINE YELLOW 11/14/2015 2135   APPEARANCEUR CLEAR 11/14/2015 2135   LABSPEC 1.016 11/14/2015 2135   PHURINE 5.5 11/14/2015 2135   GLUCOSEU NEGATIVE 11/14/2015 2135   HGBUR NEGATIVE 11/14/2015 2135   [place] NEGATIVE 11/14/2015 2135   BILIRUBINUR neg 10/02/2010 1108   KETONESUR NEGATIVE 11/14/2015 2135   PROTEINUR NEGATIVE 11/14/2015 2135   UROBILINOGEN 0.2 04/18/2014  2205   NITRITE NEGATIVE 11/14/2015  2135   LEUKOCYTESUR TRACE (A) 11/14/2015 2135      ELIGIBLE FOR AVAILABLE RESEARCH PROTOCOL: no  STUDIES: Mammography at [facility] 07/08/2016 found a breast density to be category C. There was no evidence of malignancy.  ASSESSMENT: 76 y.o. [place] woman, status post right breast upper outer quadrant biopsy 07/17/2015 for ductal carcinoma in situ, grade 3, estrogen and progesterone receptor negative, associated with an area of calcifications measuring 2.7 cm  (1) right lumpectomy and sentinel lymph node sampling 08/10/2015 showed a pTis pN0, stage 0 ductal carcinoma in situ, with negative margins, again estrogen and progesterone receptor negative  (2) adjuvant radiation completed 10/25/2015   (3) CT scan of the chest 09/04/2015 suggests interstitial lung disease.--Followed by pulmonary  PLAN: Brittnei is now over a year out from definitive surgery for her breast cancer with no evidence of disease recurrence. This is very favorable.  We reviewed the fact that her disease was noninvasive, cannot travel to other parts of her body, and therefore is not life-threatening.  Because her breast cancer was estrogen receptor negative, she would get no benefit as far as recurrence is concerned from anti-estrogens.  At this point I feel comfortable releasing her to her primary care physician's care. All she will need is a yearly physician breast exam and yearly mammography, which she obtains at [facility] enter[facility] in February.  I will be glad to see Sigrid at any point in the future if on when the need arises but as of now we are making no further routine appointment for her here.    Chauncey Cruel, MD   12/22/2016 6:21 PM Medical Oncology and Hematology [facility] [address] [place], [place] [address] Tel. [phone]    Fax. [phone]

## 2017-01-14 NOTE — Progress Notes (Deleted)
Subjective:   Carmen Duncan was seen in consultation in the movement disorder clinic at the request of [name], MD.  The evaluation is for tremor.  The patient is a 76 y.o. R handed female with a long hx of tremor.   The patient states that the tremor is in the L hand.  She notes it with activation.  If pouring something from a saucepan, it will start to tremor.  It seems to be worse in specific positions.  She has no trouble with putting on makeup or eating as this only affects her nondominant hand.  She has no trouble cutting food.  She has no vocal tremor, head tremor or RUE tremor.  She has no leg tremor.  Sometimes she feels like she is "walking like a drunk" because she is not stable.  She does not feel comfortable near the edge of a pier b/c she feels like she would fall off the edge.  She generally does not fall.  She did fall Jul 16, 2011 because she missed the last 3 steps at her home when she was coming down in a hurry.  She bruised her entire R leg and it is still very sore.    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  I do not have those records.  She saw Dr. [name].  She is on propranolol and she has been on it 7-8 years.  She is on 40 mg twice per day.  It does help.  She states that she was told that her propranolol could not be increased.  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  She sees a Publishing rights manager at [facility] and was told it was nothing to worry about.  She has it monitored q 3 years.  Her last MRI brain was done in [place].   Because of this, she will have intermittent numbness of the L face.    There was a fam hx of tremor in her sister and it is starting in her son, who is 66 years old.  Current/Previously tried tremor medications: propranolol, primidone  Current medications that may exacerbate tremor:  N/a  12/03/12:  She was started on primidone last visit.  She is only taking 1/2  of a 50 mg tablet at night.  She didn't go up to a full tablet because she did not like the way that it made her feel.  She wants to d/c the medication.  She is on propranolol but the dose cannot be increased because her pulse is already on the low end of normal.  She is having some intermittent paresthesias in her fingertips, right more than left.  03/12/13:  Topamax was added last visit, which has helped but has decreased appetite.  She tremors much less now but notices it when stressed, fatigued or under pressure.  She c/o constipation with the topamax.  She has taste aversion with the medication.  She has some paresthesias with the medication.  She remains on propranolol but is off of the primidone.  She thinks that the SE are worth continuing to take the medication.  She has had worsening of vision but thinks that she had that prior to the topamax and has an eye appt Nov 4.  She worked on last visit.  B12 was reported.  Serum protein electrophoresis did not reveal M spike.  RPR was negative.  Urinary protein  electrophoresis showed some free kappa light chains but there was no monoclonal protein, no M spike.  RPR was negative.  07/28/13 update:  The pt was on topamax 200 mg daily.  She was unable to tolerate bid dosing of the medication.  She ended up going back to 100 mg at night and it makes her sleepy at night when she takes the medication but no hang over effect.  She still has some intermittent paresthesias.    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  She does c/o dull fronal headaches, daily for the last month.  She uses sinus rinse and it helps.  States that tremor has been stable.    02/03/14 update:  The patient has a history of essential tremor, and is currently on propranolol 40 mg once a day as well as Topamax 100 mg daily.   Unless she is very tired or upset, she  does well in regards to tremor.   She received notes from [name] at [facility] since last visit.  He just said that her schwannoma was stable in size and she just needs a repeat MRI for 10 years that they will plan on doing.  She did have a repeat urinary protein electrophoresis since our last visit that was unremarkable.  I had the opportunity to review notes from her primary care physician from 01/20/2014.  There were 2 brief episodes that the patient describes in which she was walking and her legs felt weak.  She felt near syncopal.  She was not dizzy but just stated that "I couldn't have moved if I had to."    It only lasted for a few minutes.  She had already decreased the propranolol on her own when these episodes happened.  She did that primarily because she would forget to take it bid but also because of fatigue.    08/04/14 update:  The patient returns today for follow-up.  Much has happened since her last visit.  Medical records were reviewed.  She has a history of essential tremor and was on propranolol 40 mg, but that has since been reduced to what medical records said was 10 mg daily but pt states is 5 mg daily because of other medical issues.  She remains on Topamax 100 mg daily.  In November, 2015, she went to her primary care physician complaining about malaise and she had a nuclear stress test that was a low risk study with a left ventricular ejection fraction of 83%.  She saw cardiology not long thereafter and ended up having a chest x-ray demonstrating bibasilar infiltrates.  A few days later she developed a fever and was treated for community-acquired pneumonia.  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  During that hospitalization, her propranolol was decreased from 40 mg daily to 10 mg daily because of hypotension.  She became more weak during that hospitalization and was discharged to a subacute rehabilitation facility and ultimately left that rehabilitation  facility and was discharged home on December 23.  She does think that tremor may have increased slightly.  08/30/14 update:  The patient is following up today at the request of her ophthalmologist.  Pt states that she just went for a "normal checkup" because her vision had changed a little and "I needed new frames."  She states that her eye doctor thought  that there was a stroke behind the left eye and that led to an MRI of the brain and MRA of the brain on 08/23/2014.  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  There was an enhancing lesion in the right parietal region as well.  I talked on the phone with the radiologist about this lesion and he felt that this was likely a subacute vascular insult and not a metastatic lesion.  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  She had a carotid ultrasound on 07/14/2014 demonstrating 1-39% stenosis bilaterally.  She did an echocardiogram last year when she was in the hospital on 04/20/2014 demonstrating an ejection fraction of 65-70%, moderate diastolic dysfunction and moderate pulmonary HTN  02/06/15 update:  The patient returns today for follow-up.  She is on Topamax, 100 mg daily and propranolol, 10 mg daily for essential tremor.  At our last visit, I had the patient discontinue her aspirin and we changed it to Plavix.  She is doing well on that.  No melena/hematochezia.  No falls.  She had a TEE that was negative for thrombus and demonstrated normal LV function.  She was supposed to wear an event monitor, and she did, but compliance was very limited and she only wore it for part of a few days, so there was limited data.  There was no arrhythmia when she did wear it.  She had a repeat MRI of the brain on 12/16/2014.  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  On prior MRAs this is consistent with atherosclerotic changes.   I have reviewed primary care records since our last visit.  She has been losing weight over the last several months and her primary care physician asked me if it was from the Topamax.  She has had a fairly extensive negative workup.  Although I doubt it given the amount of time that she has been on Topamax, I told him that she could discontinue it.  They opted to leave her on it for now and take a wait and see approach.  She states that she has no appetite but she is forcing herself to eat.    06/08/15 update:  The patient is following up today for her essential tremor.  Last visit I discontinued her Topamax because of weight loss and memory concerns.  She reports that she does well until she gets really tired and then she will have tremor of the L hand and have trouble pouring things.  She remains on propranolol, 10 mg daily for tremor.  I reviewed records since our last visit.  Her B12 level was very low at 177 and the patient admits she was not being compliant with taking her B12.  She is now on monthly injections.  Her B12 was rechecked yesterday and it was 877.  She is doing well on her Plavix and has had no new focal or lateralizing neuro sx's.  No bleeding.    10/26/15 update:  The patient follows up today regarding essential tremor.  She is on propranolol, 20 mg daily, which was increased last visit.  She states that she is doing "okay" - notices it in the L hand when  tired, mad or angry.  She also has a history of a cerebral infarction.  She is on Plavix.  She is on Lipitor, but when her last fasting lipids were checked in June, 2016 her LDL was 101.  I reviewed records since last visit.  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  Off of b12 injections and forgetting to take pills.     01/16/16 update.  The records that were made available to me were reviewed.  This patient is accompanied in the office by her son and daughter in law who supplements the history. Much has happened since our last visit.  In terms of tremor she is doing well and remains on propranolol 20 mg daily.  She went to the hospital on 11/14/15 after a fall with L facial droop, speech change, L sided weakness and ultimately found to have a 1cm DWI positive infarct in the R corona radiata and 1cm DWI positive infarct in the R periventricular WM.  I reviewed these films.  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  Carotid u/s was negative for hemo signficant stenosis.  TEE completed which was negative, EF 55-60%.  LDL was 64.  I had increased her lipitor last visit because LDL was 101 and goal was <70.  She had a loop recorder inserted on 6/22 and then went to rehab from 6/23-7/3.  She then went back to the ED on 7/17 after falling face first in her garage and had a nasal fx.  No surgery needed. She did f/u with BEulas Post MD and her BP was elevated and he started her on amlodipine and removed her stitches.  Today, she states that her strength is back to normal except first thing in the AM she feels weak.  She is living with her family.  Memory has been an issue.  She has trouble remembering why she was in the hospital.  She is having to ask questions multiple times.  Son has concern about meds so he got pill box and that has helped.  Memory was a concern prior to the stroke but got worse after it.  Family states that it probably started in fall 2015 but most noticeable over this year.  Some trouble doing personal finances per family but pt denies this.  Resumed driving this week after son watched her.    03/14/16 update:  The patient follows up today, accompanied by her son, daughter in law, and granddaughter who supplement the history.  Overall, patient has been stable since last visit.  She has a loop recorder  which has not demonstrated any abnormal rhythms.  However, the last remote transmissions that I can see was in July.  She remains on Plavix and Lipitor.  Denies any bleeding.  Remains on propranolol for tremor and is doing well in that regard.  She had testing with Dr. BSi Raideron 02/05/2016 and went over this with the patient and her son and daughter-in-law.  This demonstrated mild vascular dementia, moderate depression and mild generalized anxiety disorder.  It was recommended that the patient not drive.  She has been driving some.   Increased socialization was recommended.  She has been somewhat of a social recluse.  It was recommended that someone oversee her finances.  07/18/16 update:  Patient follows up today, accompanied by her son who supplements the history.  The records that were made available to me were reviewed.  Saw Dr. Elsworth Soho after our last visit and put on zofran for nausea which did help.  She remains on propranolol for tremor, which has been fairly well-controlled.  We started Lexapro last visit, and she states it is helping and family agrees.  She has had a few episodes where she gradually got herself to the floor but wasn't necessarily a hard fall.  2 of them were when she tried to get the bathroom quickly so that she didn't have incontinence.  Both were during the night.  Son doesn't think that she needs bedside commode yet.    01/16/17 update:  Pt seen in f/u with her *** who supplements hx.  The records that were made available to me were reviewed.  Becoming more forgetful.  Continues to have issues with nausea.  Still on low dose propranolol 20 mg daily for tremor.  Reviewed BP's and overall were good and better than last visit.  Mood *** on lexapro.  Son called prior to the visit to relate concerns about increasing memory change.   She is having issues with remembering people. They went on vacation where she saw her ex-husband who has been remarried for 26 years and the patient asked why no  one told her he was remarried and then kept referring to him by his brother's name.   On the same vacation they met an old family friend that the patient had known since she was born and she couldn't remember who's daughter she was.   She has had trouble in the middle of the night finding the bathroom.  Having trouble figuring out how to get in bed.   He is concerned about the sudden decline in memory and wanted to pass along the information.    Outside reports reviewed: historical medical records.  Allergies  Allergen Reactions  . Latex Itching, Rash and Other (See Comments)    Red angry skin from latex tape  . Sulfonamide Derivatives Rash    hives  . Tape Itching and Rash    Please use paper tape or coban wrap!!    Current Outpatient Prescriptions on File Prior to Visit  Medication Sig Dispense Refill  . acetaminophen (TYLENOL) 325 MG tablet Take 325 mg by mouth every 6 (six) hours as needed for headache.     [name] amLODipine (NORVASC) 2.5 MG tablet TAKE ONE TABLET BY MOUTH ONCE DAILY 90 tablet 2  . atorvastatin (LIPITOR) 20 MG tablet Take 1 tablet (20 mg total) by mouth daily. 30 tablet 11  . clidinium-chlordiazePOXIDE (LIBRAX) 5-2.5 MG capsule Take 1 capsule by mouth 2 (two) times daily as needed. (Patient taking differently: Take 1 capsule by mouth at bedtime. ) 60 capsule 3  . clopidogrel (PLAVIX) 75 MG tablet TAKE ONE TABLET BY MOUTH ONCE DAILY 30 tablet 5  . escitalopram (LEXAPRO) 10 MG tablet TAKE ONE TABLET BY MOUTH ONCE DAILY 90 tablet 1  . Nintedanib ([name]) 100 MG CAPS Take 100 mg by mouth 2 (two) times daily.    [name] omeprazole (PRILOSEC) 20 MG capsule Take 1 capsule (20 mg total) by mouth daily. 90 capsule 3  . ondansetron (ZOFRAN) 4 MG tablet Take 1 tablet (4 mg total) by mouth every 8 (eight) hours as needed for nausea or vomiting. 30 tablet 0  . propranolol (INDERAL) 20 MG tablet TAKE 1 TABLET BY MOUTH ONCE DAILY 90 tablet 0   No current facility-administered medications on  file prior to visit.     Past Medical History:  Diagnosis  Date  . Acoustic neuroma ([place])   . Anxiety   . Arthritis   . Asthma   . Brain cancer ([place])   . Breast cancer of upper-outer quadrant of right female breast ([place]) 07/19/2015  . Diverticulosis   . Ejection fraction   . Essential tremor    on inderal  . GERD (gastroesophageal reflux disease)   . HOH (hard of hearing) left ear  . Hyperlipidemia   . Hypertension   . IBS (irritable bowel syndrome)   . Interstitial lung disease ([place])   . Pneumonia    04-2014, [facility]  . Stroke [facility])    no deficits, on plavix    Past Surgical History:  Procedure Laterality Date  . ABDOMINAL HYSTERECTOMY  1980  . BIOPSY BREAST    . BREAST LUMPECTOMY WITH RADIOACTIVE SEED AND SENTINEL LYMPH NODE BIOPSY Right 08/10/2015   Procedure: BREAST LUMPECTOMY WITH RADIOACTIVE SEED AND SENTINEL LYMPH NODE BIOPSY;  Surgeon: Autumn Messing III, MD;  Location: [place];  Service: General;  Laterality: Right;  . CHOLECYSTECTOMY  2011  . EP IMPLANTABLE DEVICE N/A 11/16/2015   Procedure: Loop Recorder Insertion;  Surgeon: [name], MD;  Location: [place] [facility];  Service: Cardiovascular;  Laterality: N/A;  . eyes  2007   cararacts  . TEE WITHOUT CARDIOVERSION N/A 09/05/2014   Procedure: TRANSESOPHAGEAL ECHOCARDIOGRAM (TEE);  Surgeon: Lelon Perla, MD;  Location: [facility] ENDOSCOPY;  Service: Cardiovascular;  Laterality: N/A;  . TEE WITHOUT CARDIOVERSION N/A 11/16/2015   Procedure: TRANSESOPHAGEAL ECHOCARDIOGRAM (TEE);  Surgeon: Larey Dresser, MD;  Location: [place];  Service: Cardiovascular;  Laterality: N/A;  . TONSILLECTOMY  1945    Social History   Social History  . Marital status: Widowed    Spouse name: N/A  . Number of children: N/A  . Years of education: N/A   Occupational History  . Not on file.   Social History Main Topics  . Smoking status: Never Smoker  . Smokeless tobacco: Never Used  . Alcohol use 0.0 oz/week      Comment: wine about 2 times a week   . Drug use: No  . Sexual activity: Yes    Partners: Female   Other Topics Concern  . Not on file   Social History Narrative  . No narrative on file    Family Status  Relation Status  . Mother Deceased       acute leukemia  . Father Deceased       MI, CVA  . Sister Alive       tremor  . Child Alive       son, mild tremor  . Annamarie Major (Not Specified)    Review of Systems A complete 10 system ROS was obtained and was negative apart from what is mentioned.   Objective:   VITALS:   There were no vitals filed for this visit. Wt Readings from Last 3 Encounters:  12/19/16 114 lb (51.7 kg)  10/15/16 119 lb 4.8 oz (54.1 kg)  09/03/16 118 lb 1.6 oz (53.6 kg)   Gen:  Appears stated age and in NAD. HEENT:  Normocephalic, atraumatic. The mucous membranes are moist. The superficial temporal arteries are without ropiness or tenderness. Cardiovascular: Regular rate and rhythm. Lungs: Clear to auscultation bilaterally. Neck: There are no carotid bruits noted bilaterally.  NEUROLOGICAL:  Orientation:  The patient is able to correctly identify the month and year, but misses the date by one.  She scores a 3/4 on  the clock drawing.  The hands are not even close to drawn correctly.  She is able to she asks her son several repetitive questions. Cranial nerves: There is good facial symmetry.  There is pseudoptosis from lid lag bilaterally.    Extraocular muscles are intact and visual fields are full to confrontational testing. Speech is fluent and clear. Soft palate rises symmetrically and there is no tongue deviation. Hearing is intact to conversational tone. Tone: Tone is good throughout.  There is no rigidity. Sensation: Sensation is intact to light touch throughout Coordination:  The patient has no dysdiadichokinesia or dysmetria.  No asymmetries. Motor: Strength is 5/5 in the right upper and lower extremities.  Strength is 5/5 throughout today.   Shoulder shrug is equal bilaterally.  There is no pronator drift.  There are no fasciculations noted. Gait and Station: The patient has a slightly antalgic and mildly unstable gait  MOVEMENT EXAM: Tremor:  There is no tremor of outstretched hands or with intention today   LABS  Lab Results  Component Value Date   WBC 8.0 12/11/2015   HGB 14.7 12/11/2015   HCT 44.2 12/11/2015   MCV 91.7 12/11/2015   PLT 212 12/11/2015     Chemistry      Component Value Date/Time   NA 139 08/15/2016 1638   NA 139 07/26/2015 1251   K 3.6 08/15/2016 1638   K 4.8 07/26/2015 1251   CL 101 08/15/2016 1638   CL 103 05/03/2014 0630   CO2 30 08/15/2016 1638   CO2 28 07/26/2015 1251   BUN 15 08/15/2016 1638   BUN 13.3 07/26/2015 1251   CREATININE 0.64 08/15/2016 1638   CREATININE 0.8 07/26/2015 1251      Component Value Date/Time   CALCIUM 9.7 08/15/2016 1638   CALCIUM 9.4 07/26/2015 1251   ALKPHOS 47 08/15/2016 1638   ALKPHOS 79 07/26/2015 1251   AST 13 08/15/2016 1638   AST 20 07/26/2015 1251   ALT 9 08/15/2016 1638   ALT 13 07/26/2015 1251   BILITOT 0.4 08/15/2016 1638   BILITOT 0.37 07/26/2015 1251     Lab Results  Component Value Date   TSH 2.00 10/15/2016   Lab Results  Component Value Date   VITAMINB12 265 10/15/2016   Lab Results  Component Value Date   CHOL 132 11/15/2015   HDL 35 (L) 11/15/2015   LDLCALC 64 11/15/2015   LDLDIRECT 101.0 10/31/2014   TRIG 166 (H) 11/15/2015   CHOLHDL 3.8 11/15/2015      Assessment/Plan:   1.  Essential Tremor.  -Talked about whether or not to continue propranolol.  She wants to stay on the medication, but her blood pressure was quite low today, although trending blood pressures were not quite this well.  She is not on a lot of propranolol, and is also on low-dose amlodipine.  She has not taken her blood pressure medication yet today, and I asked her to hold that and told her I would get in contact with her primary care physician.  She  has not been drinking much water and I encouraged her to do that.  -Off topamax due to cognitive change 2.  Vascular dementia.    -She had testing with Dr. Si Raider on 02/05/2016 and went over this with the patient and her son and daughter-in-law.  This demonstrated mild vascular dementia, moderate depression and mild generalized anxiety disorder.  It was recommended that the patient not drive.  I uphold this.  -discussed role of acetylcholinesterase  inhib in dementia.  Indicated only for alzheimers but may not hurt to try in her given worsening of memory and cognition.  Discussed with family and patient.  *** 3.  Hx cerebral infarct in 08/2014 and then 10/2015.  07/7900 looked embolic as were 2 separate areas of DWI positive lesions.  -MRA in 10/2015 demonstrated stable 60m R ICA aneurysm.  Carotid u/s was negative for hemo signficant stenosis.  TEE completed which was negative, EF 55-60%.  She had a loop recorder inserted on 11/16/15  -On plavix and lipitor.  LDL now 64 after lipitor increased to 20 mg daily. 4.  B12 deficiency  -improved 5.  Depression  -pt denies but neuropsych testing showed evidence of this.  No driving recommended.  Pt remains frustrated over this recommendation  -continue Lexapro, 10 mg daily.  Risks, benefits, side effects discussed.  -Told her that she needed increased socialization.  Discussed what this meant.  Discussed this last visit as well 6.  Follow up is anticipated in the next few months, sooner should new neurologic issues arise.    Much greater than 50% of this visit was spent in counseling and coordinating care.  Total face to face time:  20 min

## 2017-01-16 ENCOUNTER — Telehealth: Payer: Self-pay | Admitting: Neurology

## 2017-01-16 ENCOUNTER — Ambulatory Visit: Payer: Self-pay | Admitting: Neurology

## 2017-01-16 NOTE — Telephone Encounter (Signed)
LMOM for Mitch letting him know she prefers not to address concerns without the patient present. They can call back to let me know what's going on or write down what has been going on if they don't feel comfortable speaking in front of the patient so Dr. Carles Collet will know what to address. Let him know he could call back with any questions.

## 2017-01-16 NOTE — Telephone Encounter (Signed)
Patient's son called back to make [place] aware of some behaviors that have occurred recently, getting worse over the last 6 weeks.   Memory is getting worse. She is having issues with remembering people. He gave some examples: they went on vacation and stopped to see his dad (patient's first husband who has been remarried for 26 years) she first asked why no one told her he was remarried and then kept referring to him by his brother's name.  On the same vacation they met an old family friend that the patient had known since she was born and she couldn't remember who's daughter she was.   Also gives examples of disorientation. She went to the bathroom the other night and called his wife in to help her. She couldn't figure out how she had gotten there. His wife led her back to her room and she kept trying to get back in bed at the foot of the bed like she couldn't figure out how to enter the bed. She had wet herself on another occasion and cleaned herself up with wash clothes but then tried to flush them down the toilet.   She will often ask what she can do to help and he will try to give the patient easy tasks. The other night when they were making dinner she asked and he told her she could fill some water glasses for everyone and put them on the table. He states she got all the glasses out of the cabinet and then walked away.   He is concerned about the sudden decline in memory and wanted to pass along the information.   Dr. Reather Laurence.

## 2017-01-16 NOTE — Telephone Encounter (Signed)
PT's son called and said his daughter Carmen Duncan will be bringing PT this afternoon to the appointment because PT's son Carmen Duncan can not, he is asking if his daughter Carmen Duncan can speak to Dr Tat before she see's PT to let her know of a few things that are going on at home and he thought it would be easier to talk without PT being there

## 2017-01-26 ENCOUNTER — Other Ambulatory Visit: Payer: Self-pay | Admitting: Neurology

## 2017-01-28 NOTE — Progress Notes (Signed)
Subjective:   Carmen Duncan was seen in consultation in the movement disorder clinic at the request of [name], MD.  The evaluation is for tremor.  The patient is a 76 y.o. R handed female with a long hx of tremor.   The patient states that the tremor is in the L hand.  She notes it with activation.  If pouring something from a saucepan, it will start to tremor.  It seems to be worse in specific positions.  She has no trouble with putting on makeup or eating as this only affects her nondominant hand.  She has no trouble cutting food.  She has no vocal tremor, head tremor or RUE tremor.  She has no leg tremor.  Sometimes she feels like she is "walking like a drunk" because she is not stable.  She does not feel comfortable near the edge of a pier b/c she feels like she would fall off the edge.  She generally does not fall.  She did fall Jul 16, 2011 because she missed the last 3 steps at her home when she was coming down in a hurry.  She bruised her entire R leg and it is still very sore.    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  I do not have those records.  She saw Dr. [name].  She is on propranolol and she has been on it 7-8 years.  She is on 40 mg twice per day.  It does help.  She states that she was told that her propranolol could not be increased.  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  She sees a Publishing rights manager at [facility] and was told it was nothing to worry about.  She has it monitored q 3 years.  Her last MRI brain was done in [place].   Because of this, she will have intermittent numbness of the L face.    There was a fam hx of tremor in her sister and it is starting in her son, who is 66 years old.  Current/Previously tried tremor medications: propranolol, primidone  Current medications that may exacerbate tremor:  N/a  12/03/12:  She was started on primidone last visit.  She is only taking 1/2  of a 50 mg tablet at night.  She didn't go up to a full tablet because she did not like the way that it made her feel.  She wants to d/c the medication.  She is on propranolol but the dose cannot be increased because her pulse is already on the low end of normal.  She is having some intermittent paresthesias in her fingertips, right more than left.  03/12/13:  Topamax was added last visit, which has helped but has decreased appetite.  She tremors much less now but notices it when stressed, fatigued or under pressure.  She c/o constipation with the topamax.  She has taste aversion with the medication.  She has some paresthesias with the medication.  She remains on propranolol but is off of the primidone.  She thinks that the SE are worth continuing to take the medication.  She has had worsening of vision but thinks that she had that prior to the topamax and has an eye appt Nov 4.  She worked on last visit.  B12 was reported.  Serum protein electrophoresis did not reveal M spike.  RPR was negative.  Urinary protein  electrophoresis showed some free kappa light chains but there was no monoclonal protein, no M spike.  RPR was negative.  07/28/13 update:  The pt was on topamax 200 mg daily.  She was unable to tolerate bid dosing of the medication.  She ended up going back to 100 mg at night and it makes her sleepy at night when she takes the medication but no hang over effect.  She still has some intermittent paresthesias.    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  She does c/o dull fronal headaches, daily for the last month.  She uses sinus rinse and it helps.  States that tremor has been stable.    02/03/14 update:  The patient has a history of essential tremor, and is currently on propranolol 40 mg once a day as well as Topamax 100 mg daily.   Unless she is very tired or upset, she  does well in regards to tremor.   She received notes from [name] at [facility] since last visit.  He just said that her schwannoma was stable in size and she just needs a repeat MRI for 10 years that they will plan on doing.  She did have a repeat urinary protein electrophoresis since our last visit that was unremarkable.  I had the opportunity to review notes from her primary care physician from 01/20/2014.  There were 2 brief episodes that the patient describes in which she was walking and her legs felt weak.  She felt near syncopal.  She was not dizzy but just stated that "I couldn't have moved if I had to."    It only lasted for a few minutes.  She had already decreased the propranolol on her own when these episodes happened.  She did that primarily because she would forget to take it bid but also because of fatigue.    08/04/14 update:  The patient returns today for follow-up.  Much has happened since her last visit.  Medical records were reviewed.  She has a history of essential tremor and was on propranolol 40 mg, but that has since been reduced to what medical records said was 10 mg daily but pt states is 5 mg daily because of other medical issues.  She remains on Topamax 100 mg daily.  In November, 2015, she went to her primary care physician complaining about malaise and she had a nuclear stress test that was a low risk study with a left ventricular ejection fraction of 83%.  She saw cardiology not long thereafter and ended up having a chest x-ray demonstrating bibasilar infiltrates.  A few days later she developed a fever and was treated for community-acquired pneumonia.  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  During that hospitalization, her propranolol was decreased from 40 mg daily to 10 mg daily because of hypotension.  She became more weak during that hospitalization and was discharged to a subacute rehabilitation facility and ultimately left that rehabilitation  facility and was discharged home on December 23.  She does think that tremor may have increased slightly.  08/30/14 update:  The patient is following up today at the request of her ophthalmologist.  Pt states that she just went for a "normal checkup" because her vision had changed a little and "I needed new frames."  She states that her eye doctor thought  that there was a stroke behind the left eye and that led to an MRI of the brain and MRA of the brain on 08/23/2014.  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  There was an enhancing lesion in the right parietal region as well.  I talked on the phone with the radiologist about this lesion and he felt that this was likely a subacute vascular insult and not a metastatic lesion.  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  She had a carotid ultrasound on 07/14/2014 demonstrating 1-39% stenosis bilaterally.  She did an echocardiogram last year when she was in the hospital on 04/20/2014 demonstrating an ejection fraction of 65-70%, moderate diastolic dysfunction and moderate pulmonary HTN  02/06/15 update:  The patient returns today for follow-up.  She is on Topamax, 100 mg daily and propranolol, 10 mg daily for essential tremor.  At our last visit, I had the patient discontinue her aspirin and we changed it to Plavix.  She is doing well on that.  No melena/hematochezia.  No falls.  She had a TEE that was negative for thrombus and demonstrated normal LV function.  She was supposed to wear an event monitor, and she did, but compliance was very limited and she only wore it for part of a few days, so there was limited data.  There was no arrhythmia when she did wear it.  She had a repeat MRI of the brain on 12/16/2014.  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  On prior MRAs this is consistent with atherosclerotic changes.   I have reviewed primary care records since our last visit.  She has been losing weight over the last several months and her primary care physician asked me if it was from the Topamax.  She has had a fairly extensive negative workup.  Although I doubt it given the amount of time that she has been on Topamax, I told him that she could discontinue it.  They opted to leave her on it for now and take a wait and see approach.  She states that she has no appetite but she is forcing herself to eat.    06/08/15 update:  The patient is following up today for her essential tremor.  Last visit I discontinued her Topamax because of weight loss and memory concerns.  She reports that she does well until she gets really tired and then she will have tremor of the L hand and have trouble pouring things.  She remains on propranolol, 10 mg daily for tremor.  I reviewed records since our last visit.  Her B12 level was very low at 177 and the patient admits she was not being compliant with taking her B12.  She is now on monthly injections.  Her B12 was rechecked yesterday and it was 877.  She is doing well on her Plavix and has had no new focal or lateralizing neuro sx's.  No bleeding.    10/26/15 update:  The patient follows up today regarding essential tremor.  She is on propranolol, 20 mg daily, which was increased last visit.  She states that she is doing "okay" - notices it in the L hand when  tired, mad or angry.  She also has a history of a cerebral infarction.  She is on Plavix.  She is on Lipitor, but when her last fasting lipids were checked in June, 2016 her LDL was 101.  I reviewed records since last visit.  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  Off of b12 injections and forgetting to take pills.     01/16/16 update.  The records that were made available to me were reviewed.  This patient is accompanied in the office by her son and daughter in law who supplements the history. Much has happened since our last visit.  In terms of tremor she is doing well and remains on propranolol 20 mg daily.  She went to the hospital on 11/14/15 after a fall with L facial droop, speech change, L sided weakness and ultimately found to have a 1cm DWI positive infarct in the R corona radiata and 1cm DWI positive infarct in the R periventricular WM.  I reviewed these films.  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  Carotid u/s was negative for hemo signficant stenosis.  TEE completed which was negative, EF 55-60%.  LDL was 64.  I had increased her lipitor last visit because LDL was 101 and goal was <70.  She had a loop recorder inserted on 6/22 and then went to rehab from 6/23-7/3.  She then went back to the ED on 7/17 after falling face first in her garage and had a nasal fx.  No surgery needed. She did f/u with BEulas Post MD and her BP was elevated and he started her on amlodipine and removed her stitches.  Today, she states that her strength is back to normal except first thing in the AM she feels weak.  She is living with her family.  Memory has been an issue.  She has trouble remembering why she was in the hospital.  She is having to ask questions multiple times.  Son has concern about meds so he got pill box and that has helped.  Memory was a concern prior to the stroke but got worse after it.  Family states that it probably started in fall 2015 but most noticeable over this year.  Some trouble doing personal finances per family but pt denies this.  Resumed driving this week after son watched her.    03/14/16 update:  The patient follows up today, accompanied by her son, daughter in law, and granddaughter who supplement the history.  Overall, patient has been stable since last visit.  She has a loop recorder  which has not demonstrated any abnormal rhythms.  However, the last remote transmissions that I can see was in July.  She remains on Plavix and Lipitor.  Denies any bleeding.  Remains on propranolol for tremor and is doing well in that regard.  She had testing with Dr. BSi Raideron 02/05/2016 and went over this with the patient and her son and daughter-in-law.  This demonstrated mild vascular dementia, moderate depression and mild generalized anxiety disorder.  It was recommended that the patient not drive.  She has been driving some.   Increased socialization was recommended.  She has been somewhat of a social recluse.  It was recommended that someone oversee her finances.  07/18/16 update:  Patient follows up today, accompanied by her son who supplements the history.  The records that were made available to me were reviewed.  Saw Dr. Elsworth Soho after our last visit and put on zofran for nausea which did help.  She remains on propranolol for tremor, which has been fairly well-controlled.  We started Lexapro last visit, and she states it is helping and family agrees.  She has had a few episodes where she gradually got herself to the floor but wasn't necessarily a hard fall.  2 of them were when she tried to get the bathroom quickly so that she didn't have incontinence.  Both were during the night.  Son doesn't think that she needs bedside commode yet.    01/29/17 update:  Pt seen in f/u with her son and daughter in law who supplements hx.  The records that were made available to me were reviewed.  Becoming more forgetful.  Continues to have issues with nausea.  Still on low dose propranolol 20 mg daily for tremor.  Reviewed BP's and overall were good and better than last visit.  Mood "fair" on lexapro.  Son called prior to the visit to relate concerns about increasing memory change.  When I ask patient about her memory she states "I don't like it."   She is having issues with remembering people. They went on vacation where  she saw her ex-husband who has been remarried for 26 years and the patient asked why no one told her he was remarried and then kept referring to him by his brother's name.   On the same vacation they met an old family friend that the patient had known since she was born and she couldn't remember who's daughter she was.   3 weeks later, they were in [place] with daughter in laws family and were confused why sons family wasn't there.  Less trouble with closer family relationships (the ones she lives with).  Her family did go shopping this weekend at [employer] at [facility] but did think that her family was at [employer] at [employer].   She has had trouble in the middle of the night finding the bathroom.  Woke daughter in law up and wasn't even in pajamas but just t-shirt and had wet self all the way to the restroom.   Having trouble figuring out how to get in bed.  She denies hallucinations.  She states that her appetite is good; her son states that it is better after 5pm.   Son puts pills in pillbox and she is reminded to take them.  She gets up at varied time.  She may stay in pajamas until 4 pm when son is ready to come home.  She can get ready/dressed for herself.  Food is prepared for her.  If they aren't there to prepare food, she won't eat.  Having naps during day, perhaps 2 and they are spontaneous.  Cannot state how long these naps are.  Will sometimes watch TV all day.  Daughter in law works from home but not necessarily next to her all day.  Outside reports reviewed: historical medical records.  Allergies  Allergen Reactions  . Latex Itching, Rash and Other (See Comments)    Red angry skin from latex tape  . Sulfonamide Derivatives Rash    hives  . Tape Itching and Rash    Please use paper tape or coban wrap!!    Current Outpatient Prescriptions on File Prior to Visit  Medication Sig Dispense Refill  . acetaminophen (TYLENOL) 325 MG tablet Take 325 mg by mouth every 6 (six) hours as needed for headache.     [name]  amLODipine (NORVASC) 2.5 MG tablet TAKE ONE TABLET BY MOUTH ONCE DAILY 90 tablet 2  . atorvastatin (LIPITOR) 20 MG tablet Take 1 tablet (20 mg total) by mouth daily. 30 tablet 11  . clopidogrel (PLAVIX) 75 MG tablet TAKE ONE TABLET BY MOUTH ONCE DAILY 30 tablet 5  . escitalopram (LEXAPRO) 10 MG tablet TAKE ONE TABLET BY MOUTH ONCE DAILY 90 tablet 1  . Nintedanib ([name]) 100 MG CAPS Take 100 mg by mouth 2 (two) times daily.    [name] omeprazole (PRILOSEC) 20 MG capsule Take 1 capsule (20 mg total) by mouth daily. 90 capsule 3  . ondansetron (ZOFRAN) 4 MG tablet Take 1 tablet (4 mg total) by mouth every 8 (eight) hours as needed for nausea or vomiting. 30 tablet 0  . propranolol (INDERAL) 20 MG tablet TAKE 1 TABLET BY MOUTH ONCE DAILY 90 tablet 0   No current facility-administered medications on file prior to visit.     Past Medical History:  Diagnosis Date  . Acoustic neuroma ([place])   . Anxiety   . Arthritis   . Asthma   . Brain cancer ([place])   . Breast cancer of upper-outer quadrant of right female breast ([place]) 07/19/2015  . Diverticulosis   . Ejection fraction   . Essential tremor    on inderal  . GERD (gastroesophageal reflux disease)   . HOH (hard of hearing) left ear  . Hyperlipidemia   . Hypertension   . IBS (irritable bowel syndrome)   . Interstitial lung disease ([place])   . Pneumonia    04-2014, CAP  . Stroke [facility])    no deficits, on plavix    Past Surgical History:  Procedure Laterality Date  . ABDOMINAL HYSTERECTOMY  1980  . BIOPSY BREAST    . BREAST LUMPECTOMY WITH RADIOACTIVE SEED AND SENTINEL LYMPH NODE BIOPSY Right 08/10/2015   Procedure: BREAST LUMPECTOMY WITH RADIOACTIVE SEED AND SENTINEL LYMPH NODE BIOPSY;  Surgeon: Autumn Messing III, MD;  Location: [place];  Service: General;  Laterality: Right;  . CHOLECYSTECTOMY  2011  . EP IMPLANTABLE DEVICE N/A 11/16/2015   Procedure: Loop Recorder Insertion;  Surgeon: [name], MD;  Location: [place] CV LAB;   Service: Cardiovascular;  Laterality: N/A;  . eyes  2007   cararacts  . TEE WITHOUT CARDIOVERSION N/A 09/05/2014   Procedure: TRANSESOPHAGEAL ECHOCARDIOGRAM (TEE);  Surgeon: Lelon Perla, MD;  Location: [facility] ENDOSCOPY;  Service: Cardiovascular;  Laterality: N/A;  . TEE WITHOUT CARDIOVERSION N/A 11/16/2015   Procedure: TRANSESOPHAGEAL ECHOCARDIOGRAM (TEE);  Surgeon: Larey Dresser, MD;  Location: [place];  Service: Cardiovascular;  Laterality: N/A;  . TONSILLECTOMY  1945    Social History   Social History  . Marital status: Widowed    Spouse name: N/A  . Number of children: N/A  . Years of education: N/A   Occupational History  . Not on file.   Social History Main Topics  . Smoking status: Never Smoker  . Smokeless tobacco: Never Used  . Alcohol use 0.0 oz/week     Comment: wine about 2 times a week   . Drug use: No  . Sexual activity: Yes    Partners: Female   Other Topics Concern  . Not on file   Social History Narrative  . No narrative on file    Family Status  Relation Status  . Mother Deceased       acute leukemia  . Father Deceased       MI,  CVA  . Sister Alive       tremor  . Child Alive       son, mild tremor  . Annamarie Major (Not Specified)    Review of Systems A complete 10 system ROS was obtained and was negative apart from what is mentioned.   Objective:   VITALS:   Vitals:   01/29/17 0923  BP: 130/70  Pulse: 72  SpO2: 92%   Wt Readings from Last 3 Encounters:  12/19/16 114 lb (51.7 kg)  10/15/16 119 lb 4.8 oz (54.1 kg)  09/03/16 118 lb 1.6 oz (53.6 kg)   Gen:  Appears stated age and in NAD. HEENT:  Normocephalic, atraumatic. The mucous membranes are moist. The superficial temporal arteries are without ropiness or tenderness. Cardiovascular: Regular rate and rhythm. Lungs: Clear to auscultation bilaterally. Neck: There are no carotid bruits noted bilaterally.  NEUROLOGICAL:  Orientation:   Montreal Cognitive Assessment  01/29/2017   Visuospatial/ Executive (0/5) 2  Naming (0/3) 3  Attention: Read list of digits (0/2) 2  Attention: Read list of letters (0/1) 1  Attention: Serial 7 subtraction starting at 100 (0/3) 1  Language: Repeat phrase (0/2) 2  Language : Fluency (0/1) 0  Abstraction (0/2) 1  Delayed Recall (0/5) 1  Orientation (0/6) 4  Total 17  Adjusted Score (based on education) 18    Cranial nerves: There is good facial symmetry.  There is pseudoptosis from lid lag bilaterally.    Extraocular muscles are intact and visual fields are full to confrontational testing. Speech is fluent and clear. Soft palate rises symmetrically and there is no tongue deviation. Hearing is intact to conversational tone. Tone: Tone is good throughout.  There is no rigidity. Sensation: Sensation is intact to light touch throughout Coordination:  The patient has no dysdiadichokinesia or dysmetria.  No asymmetries. Motor: Strength is 5/5 in the right upper and lower extremities.  Strength is 5/5 throughout today.  Shoulder shrug is equal bilaterally.  There is no pronator drift.  There are no fasciculations noted. Gait and Station: The patient has a slightly antalgic and mildly unstable gait  MOVEMENT EXAM: Tremor:  There is no tremor of outstretched hands or with intention today   LABS  Lab Results  Component Value Date   WBC 8.0 12/11/2015   HGB 14.7 12/11/2015   HCT 44.2 12/11/2015   MCV 91.7 12/11/2015   PLT 212 12/11/2015     Chemistry      Component Value Date/Time   NA 139 08/15/2016 1638   NA 139 07/26/2015 1251   K 3.6 08/15/2016 1638   K 4.8 07/26/2015 1251   CL 101 08/15/2016 1638   CL 103 05/03/2014 0630   CO2 30 08/15/2016 1638   CO2 28 07/26/2015 1251   BUN 15 08/15/2016 1638   BUN 13.3 07/26/2015 1251   CREATININE 0.64 08/15/2016 1638   CREATININE 0.8 07/26/2015 1251      Component Value Date/Time   CALCIUM 9.7 08/15/2016 1638   CALCIUM 9.4 07/26/2015 1251   ALKPHOS 47 08/15/2016 1638    ALKPHOS 79 07/26/2015 1251   AST 13 08/15/2016 1638   AST 20 07/26/2015 1251   ALT 9 08/15/2016 1638   ALT 13 07/26/2015 1251   BILITOT 0.4 08/15/2016 1638   BILITOT 0.37 07/26/2015 1251     Lab Results  Component Value Date   TSH 2.00 10/15/2016   Lab Results  Component Value Date   VITAMINB12 265 10/15/2016   Lab  Results  Component Value Date   CHOL 132 11/15/2015   HDL 35 (L) 11/15/2015   LDLCALC 64 11/15/2015   LDLDIRECT 101.0 10/31/2014   TRIG 166 (H) 11/15/2015   CHOLHDL 3.8 11/15/2015      Assessment/Plan:   1.  Essential Tremor.  -Talked about whether or not to continue propranolol.  Son would like to continue this.   -Off topamax due to cognitive change 2.  Vascular dementia.    -She had testing with Dr. Si Raider on 02/05/2016 and we have reviewed this, as has Dr. Si Raider.  This demonstrated mild vascular dementia (which has most certainly progressed over the year), moderate depression and mild generalized anxiety disorder.   -discussed role of acetylcholinesterase inhib in dementia.  Indicated only for alzheimers but may not hurt to try in her given worsening of memory and cognition.  Discussed with family and patient.  They decided to start donepezil, 5 mg daily for a month and then increase to 10 mg daily.  Risks, benefits, side effects and alternative therapies were discussed.  The opportunity to ask questions was given and they were answered to the best of my ability.  The patient expressed understanding and willingness to follow the outlined treatment protocols.  -talked about need for 24 hour per day care  -talked about need for schedule.    -talked about adult daycare.   -given information on caregiving services 3.  Hx cerebral infarct in 08/2014 and then 10/2015.  0/3159 looked embolic as were 2 separate areas of DWI positive lesions.  -MRA in 10/2015 demonstrated stable 50m R ICA aneurysm.  Carotid u/s was negative for hemo signficant stenosis.  TEE completed  which was negative, EF 55-60%.  She had a loop recorder inserted on 11/16/15  -On plavix and lipitor.  LDL now 64 after lipitor increased to 20 mg daily. 4.  B12 deficiency  -improved 5.  Depression  -pt denies but neuropsych testing showed evidence of this.  No driving recommended.  Pt remains frustrated over this recommendation  -continue Lexapro, 10 mg daily.  Risks, benefits, side effects discussed. 6. Follow up is anticipated in the next few months, sooner should new neurologic issues arise.  Much greater than 50% of this visit was spent in counseling and coordinating care.  Total face to face time:  40 min

## 2017-01-29 ENCOUNTER — Ambulatory Visit (INDEPENDENT_AMBULATORY_CARE_PROVIDER_SITE_OTHER): Payer: Medicare Other | Admitting: Neurology

## 2017-01-29 ENCOUNTER — Encounter: Payer: Self-pay | Admitting: Neurology

## 2017-01-29 VITALS — BP 130/70 | HR 72

## 2017-01-29 DIAGNOSIS — G250 Essential tremor: Secondary | ICD-10-CM

## 2017-01-29 DIAGNOSIS — F0150 Vascular dementia without behavioral disturbance: Secondary | ICD-10-CM | POA: Diagnosis not present

## 2017-01-29 MED ORDER — DONEPEZIL HCL 10 MG PO TABS: 10.0000 mg | ORAL_TABLET | Freq: Every day | ORAL | 1 refills | Status: DC

## 2017-01-29 MED ORDER — DONEPEZIL HCL 5 MG PO TABS: 5.0000 mg | ORAL_TABLET | Freq: Every day | ORAL | 0 refills | Status: DC

## 2017-01-29 NOTE — Patient Instructions (Signed)
Start Aricept 5 mg daily for one month, then increase to 10 mg a month. Both prescriptions have been sent to your pharmacy.

## 2017-02-13 ENCOUNTER — Encounter: Payer: Self-pay | Admitting: Family Medicine

## 2017-03-09 ENCOUNTER — Other Ambulatory Visit: Payer: Self-pay | Admitting: Neurology

## 2017-03-17 ENCOUNTER — Ambulatory Visit: Payer: Self-pay | Admitting: Neurology

## 2017-04-15 ENCOUNTER — Encounter: Payer: Self-pay | Admitting: Family Medicine

## 2017-04-16 ENCOUNTER — Encounter: Payer: Self-pay | Admitting: Family Medicine

## 2017-04-16 ENCOUNTER — Ambulatory Visit: Payer: Medicare Other | Admitting: Family Medicine

## 2017-04-16 VITALS — BP 110/68 | Temp 97.5°F | Wt 116.5 lb

## 2017-04-16 DIAGNOSIS — R531 Weakness: Secondary | ICD-10-CM

## 2017-04-16 DIAGNOSIS — F0150 Vascular dementia without behavioral disturbance: Secondary | ICD-10-CM

## 2017-04-16 DIAGNOSIS — Z23 Encounter for immunization: Secondary | ICD-10-CM

## 2017-04-16 DIAGNOSIS — R634 Abnormal weight loss: Secondary | ICD-10-CM | POA: Diagnosis not present

## 2017-04-16 DIAGNOSIS — E440 Moderate protein-calorie malnutrition: Secondary | ICD-10-CM

## 2017-04-16 NOTE — Progress Notes (Signed)
Subjective:     Patient ID: Carmen Duncan, female   DOB: 22-Apr-1941, 76 y.o.   MRN: [id number]  HPI Patient is seen accompanied by son and daughter-in-law for follow-up. She has multiple chronic medical problems including history of pulmonary hypertension, hypertension, history of CVA, chronic diastolic heart failure, idiopathic pulmonary fibrosis, IBS, GERD, vascular dementia, essential tremor, and remote history of acoustic neuroma.  Son sent email recently with several concerns. They think she's been more forgetful. She was recently started on Aricept per neurology. More unsteady with gait. She's had some urine incontinence and using depends full-time. Intermittent episodes of diarrhea. She's had a few episodes of vomiting but no complaint of abdominal pain. General decline in health and several months.  Medications reviewed. She remains on Lexapro 10 mg daily. Other medications reviewed and compliant with all. She's had some gradual weight loss of about 3 additional pound since last visit here in May.  No recent falls. Denies any burning with urination. Her sleep has been irregular and apparently she has had low motivation to get out of bed and has not had a very structured routine. He thinks she may do better getting her more socially engaged.  Past Medical History:  Diagnosis Date  . Acoustic neuroma ([place])   . Anxiety   . Arthritis   . Asthma   . Brain cancer ([place])   . Breast cancer of upper-outer quadrant of right female breast ([place]) 07/19/2015  . Diverticulosis   . Ejection fraction   . Essential tremor    on inderal  . GERD (gastroesophageal reflux disease)   . HOH (hard of hearing) left ear  . Hyperlipidemia   . Hypertension   . IBS (irritable bowel syndrome)   . Interstitial lung disease ([place])   . Pneumonia    04-2014, CAP  . Stroke [facility])    no deficits, on plavix   Past Surgical History:  Procedure Laterality Date  . ABDOMINAL HYSTERECTOMY  1980  . BIOPSY BREAST    .  BREAST LUMPECTOMY WITH RADIOACTIVE SEED AND SENTINEL LYMPH NODE BIOPSY Right 08/10/2015   Procedure: BREAST LUMPECTOMY WITH RADIOACTIVE SEED AND SENTINEL LYMPH NODE BIOPSY;  Surgeon: Autumn Messing III, MD;  Location: [place];  Service: General;  Laterality: Right;  . CHOLECYSTECTOMY  2011  . EP IMPLANTABLE DEVICE N/A 11/16/2015   Procedure: Loop Recorder Insertion;  Surgeon: [name], MD;  Location: [place] CV LAB;  Service: Cardiovascular;  Laterality: N/A;  . eyes  2007   cararacts  . TEE WITHOUT CARDIOVERSION N/A 09/05/2014   Procedure: TRANSESOPHAGEAL ECHOCARDIOGRAM (TEE);  Surgeon: Lelon Perla, MD;  Location: [facility] ENDOSCOPY;  Service: Cardiovascular;  Laterality: N/A;  . TEE WITHOUT CARDIOVERSION N/A 11/16/2015   Procedure: TRANSESOPHAGEAL ECHOCARDIOGRAM (TEE);  Surgeon: Larey Dresser, MD;  Location: [facility] ENDOSCOPY;  Service: Cardiovascular;  Laterality: N/A;  . TONSILLECTOMY  1945    reports that  has never smoked. she has never used smokeless tobacco. She reports that she drinks alcohol. She reports that she does not use drugs. family history includes Heart disease (age of onset: 98) in her father; Hyperlipidemia in her father and mother; Hypertension in her mother; Lung cancer in her paternal uncle; Stroke in her father. Allergies  Allergen Reactions  . Latex Itching, Rash and Other (See Comments)    Red angry skin from latex tape  . Sulfonamide Derivatives Rash    hives  . Tape Itching and Rash    Please use paper  tape or coban wrap!!     Past Medical History:  Diagnosis Date  . Acoustic neuroma ([place])   . Anxiety   . Arthritis   . Asthma   . Brain cancer ([place])   . Breast cancer of upper-outer quadrant of right female breast ([place]) 07/19/2015  . Diverticulosis   . Ejection fraction   . Essential tremor    on inderal  . GERD (gastroesophageal reflux disease)   . HOH (hard of hearing) left ear  . Hyperlipidemia   . Hypertension   . IBS (irritable  bowel syndrome)   . Interstitial lung disease ([place])   . Pneumonia    04-2014, CAP  . Stroke [facility])    no deficits, on plavix   Past Surgical History:  Procedure Laterality Date  . ABDOMINAL HYSTERECTOMY  1980  . BIOPSY BREAST    . BREAST LUMPECTOMY WITH RADIOACTIVE SEED AND SENTINEL LYMPH NODE BIOPSY Right 08/10/2015   Procedure: BREAST LUMPECTOMY WITH RADIOACTIVE SEED AND SENTINEL LYMPH NODE BIOPSY;  Surgeon: Autumn Messing III, MD;  Location: [place];  Service: General;  Laterality: Right;  . CHOLECYSTECTOMY  2011  . EP IMPLANTABLE DEVICE N/A 11/16/2015   Procedure: Loop Recorder Insertion;  Surgeon: [name], MD;  Location: [place] CV LAB;  Service: Cardiovascular;  Laterality: N/A;  . eyes  2007   cararacts  . TEE WITHOUT CARDIOVERSION N/A 09/05/2014   Procedure: TRANSESOPHAGEAL ECHOCARDIOGRAM (TEE);  Surgeon: Lelon Perla, MD;  Location: [facility] ENDOSCOPY;  Service: Cardiovascular;  Laterality: N/A;  . TEE WITHOUT CARDIOVERSION N/A 11/16/2015   Procedure: TRANSESOPHAGEAL ECHOCARDIOGRAM (TEE);  Surgeon: Larey Dresser, MD;  Location: [facility] ENDOSCOPY;  Service: Cardiovascular;  Laterality: N/A;  . TONSILLECTOMY  1945    reports that  has never smoked. she has never used smokeless tobacco. She reports that she drinks alcohol. She reports that she does not use drugs. family history includes Heart disease (age of onset: 18) in her father; Hyperlipidemia in her father and mother; Hypertension in her mother; Lung cancer in her paternal uncle; Stroke in her father. Allergies  Allergen Reactions  . Latex Itching, Rash and Other (See Comments)    Red angry skin from latex tape  . Sulfonamide Derivatives Rash    hives  . Tape Itching and Rash    Please use paper tape or coban wrap!!      Review of Systems  Constitutional: Negative for chills and fever.  Respiratory: Negative for cough.   Cardiovascular: Negative for chest pain and palpitations.  Gastrointestinal:  Negative for abdominal pain.  Genitourinary: Negative for dysuria.  Neurological: Negative for headaches.       Objective:   Physical Exam  Constitutional: She appears well-developed.  HENT:  Mouth/Throat: Oropharynx is clear and moist.  Neck: Neck supple. No thyromegaly present.  Cardiovascular: Normal rate.  Pulmonary/Chest: Effort normal.  Patient has some crackles right base greater than left and these are apparently chronic.  Musculoskeletal: She exhibits no edema.  Lymphadenopathy:    She has no cervical adenopathy.  Neurological: She is alert.       Assessment:     Patient with multiple chronic medical problems as above with general decline in health over several months. Suspect multifactorial related to her dementia, multiple comorbidities, and possibly social isolation    Plan:     -She's had fairly extensive physical therapy in the past. -Look at possible options for adult enrichment programs- day programs -flu vaccine given.  Alinda Sierras  Shantia Sanford MD [facility] at [facility]

## 2017-05-25 IMAGING — MR MR HEAD W/O CM
9 of 11 series · 29 of 48 positions shown · non-contrast
Comparison: CT HEAD November 14, 2015 at 7366 hours and MRI of the
brain December 16, 2014 and MRA head August 23, 2014.

CLINICAL DATA: New onset LEFT-sided weakness, possibly beginning
yesterday. LEFT facial droop today. History of hypertension,
hyperlipidemia, stroke, breast cancer.

EXAM:
MRI HEAD WITHOUT CONTRAST
MRA HEAD WITHOUT CONTRAST
TECHNIQUE: Multiplanar, multiecho pulse sequences of the brain and surrounding
structures were obtained without intravenous contrast. Angiographic
images of the head were obtained using MRA technique without
contrast.

[Series 3: DWI · axial · 3.0mm · 0.94mm/px · z∈[-72,+66]mm · 7 of 96 slices shown (1 of 2)]
[im 1/96]
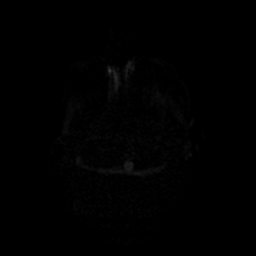
[im 16/96]
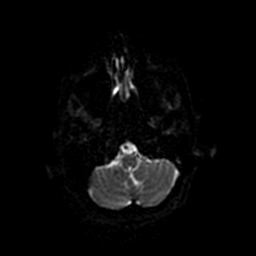
[im 32/96]
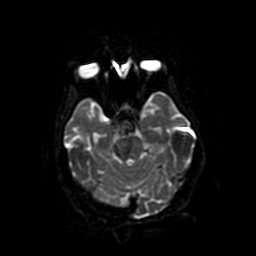
[im 48/96]
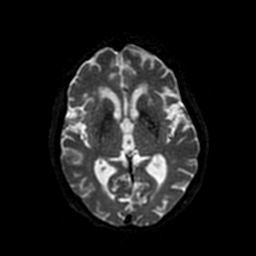
[im 64/96]
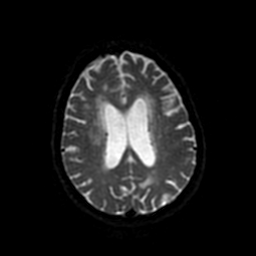
[im 80/96]
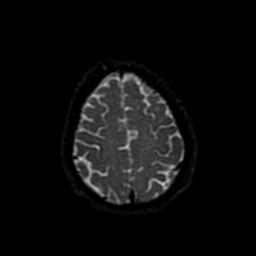
[im 96/96]
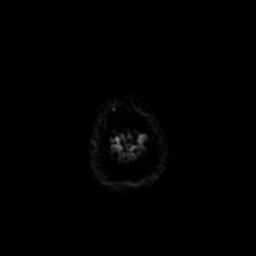

[Series 4: DWI · coronal · 4.0mm · 0.94mm/px · 5 of 66 slices shown (2 of 2)]
[im 1/66]
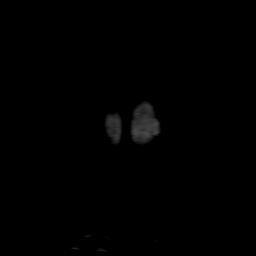
[im 17/66]
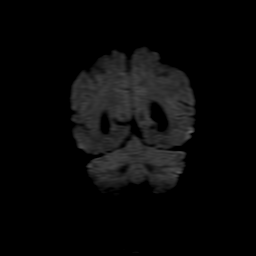
[im 33/66]
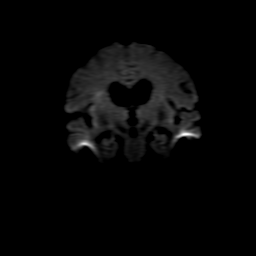
[im 49/66]
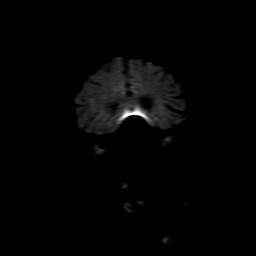
[im 66/66]
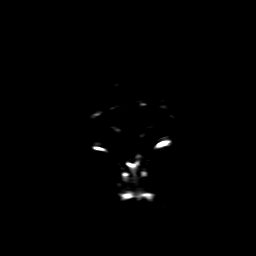

[Series 5: FLAIR · sagittal · 5.0mm · 0.47mm/px · 2 of 23 slices shown (1 of 2)]
[im 1/23]
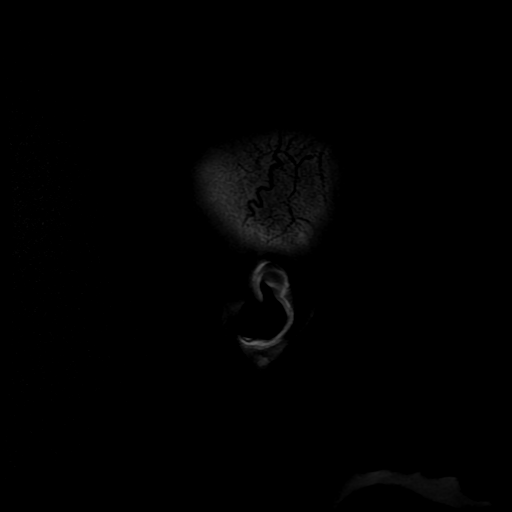
[im 23/23]
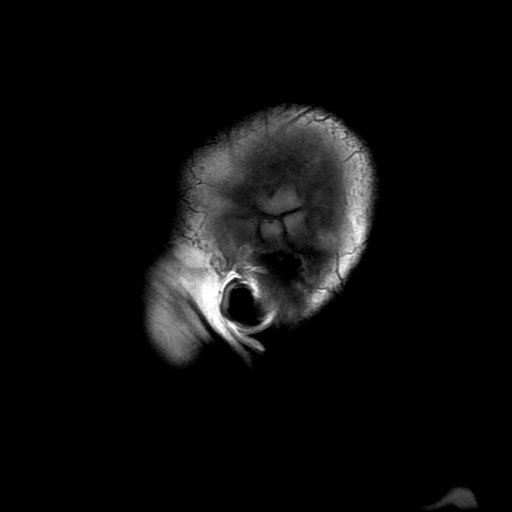

[Series 6: ax (id) 2 · axial · 1.0mm · 0.43mm/px · z∈[-70,-33]mm · 4 of 184 slices shown]
[im 1/184]
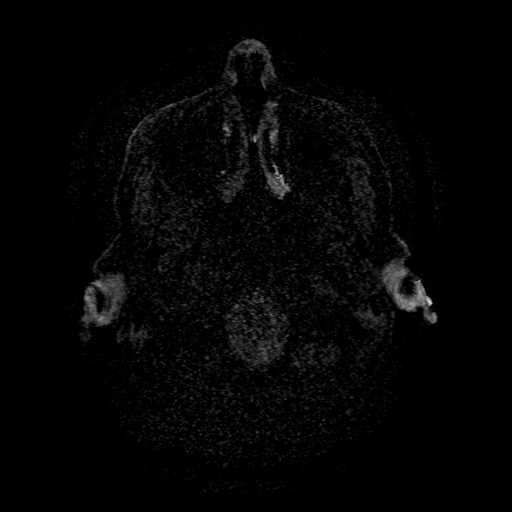
[im 31/184]
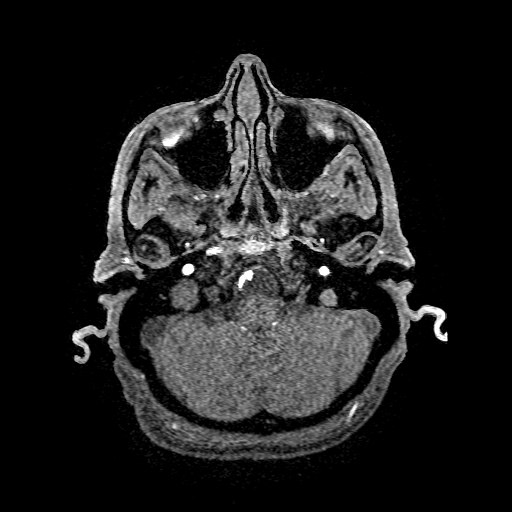
[im 62/184]
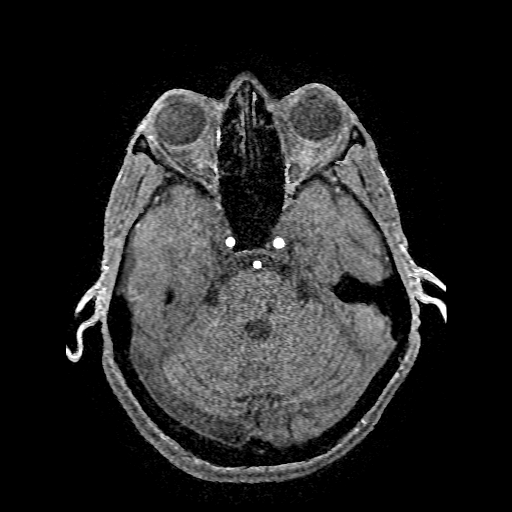
[im 77/184]
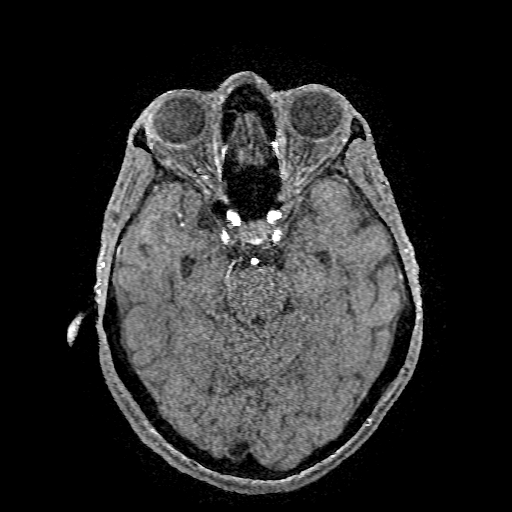

[Series 8: T2 · axial · 5.0mm · 0.47mm/px · z∈[-70,+64]mm · 2 of 24 slices shown (1 of 2)]
[im 1/24]
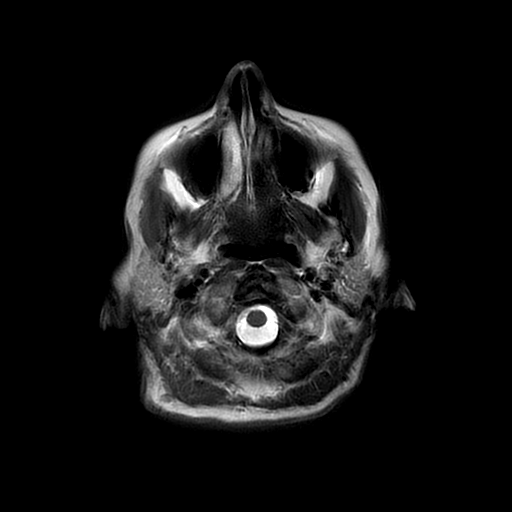
[im 24/24]
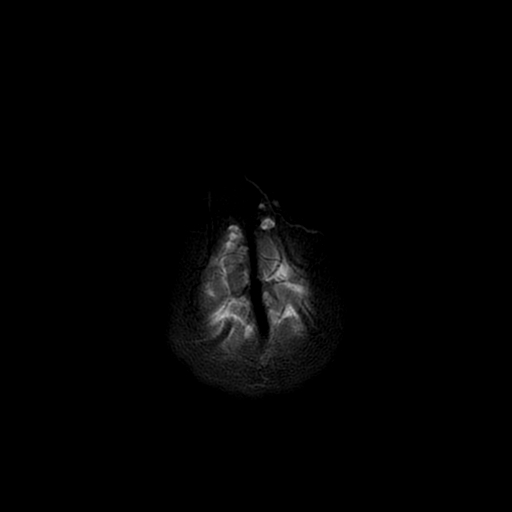

[Series 9: FLAIR · axial · 5.0mm · 0.47mm/px · z∈[-70,+64]mm · 2 of 24 slices shown (2 of 2)]
[im 1/24]
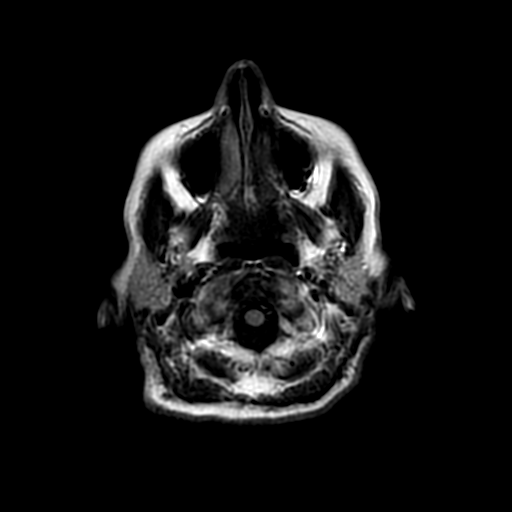
[im 24/24]
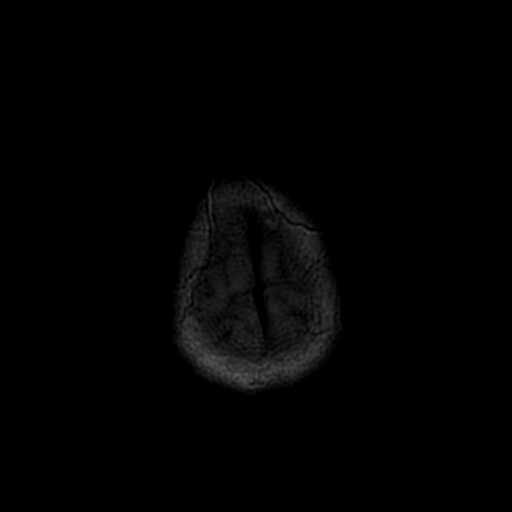

[Series 12: T2 · coronal · 5.0mm · 0.47mm/px · 2 of 26 slices shown (2 of 2)]
[im 1/26]
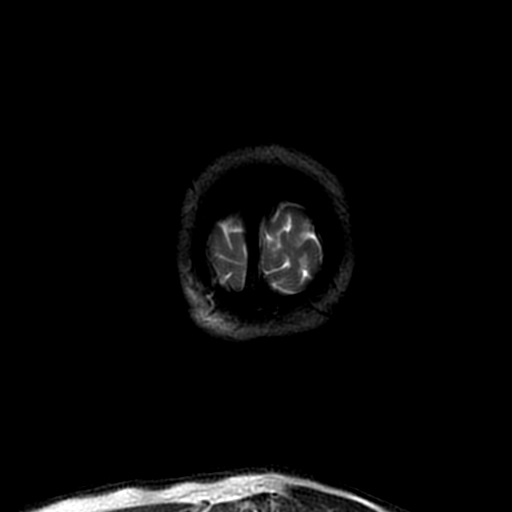
[im 26/26]
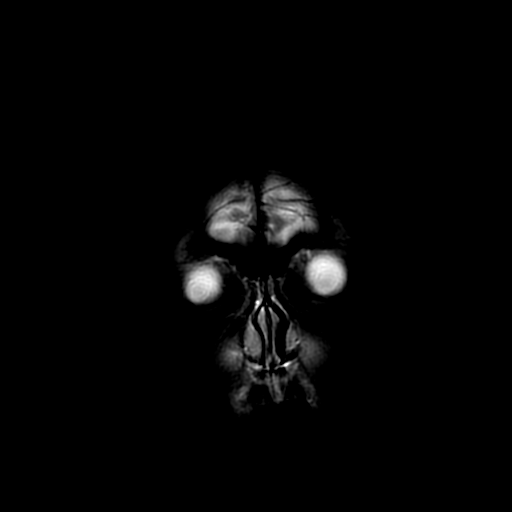

[Series 350: ADC · axial · 3.0mm · 0.94mm/px · z∈[-72,+66]mm · 3 of 48 slices shown (1 of 2)]
[im 1/48]
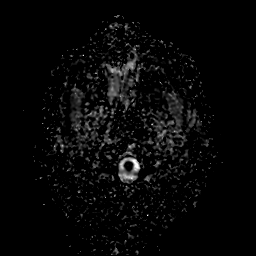
[im 24/48]
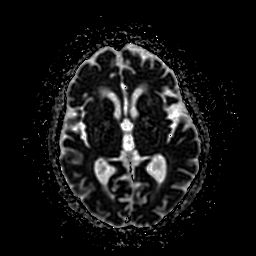
[im 48/48]
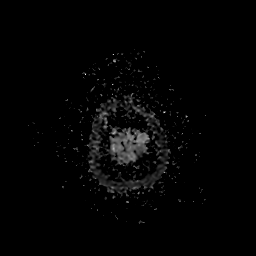

[Series 450: ADC · coronal · 4.0mm · 0.94mm/px · 2 of 33 slices shown (2 of 2)]
[im 1/33]
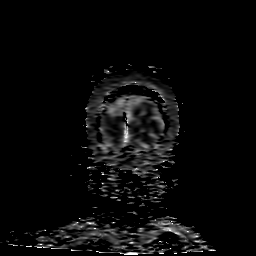
[im 33/33]
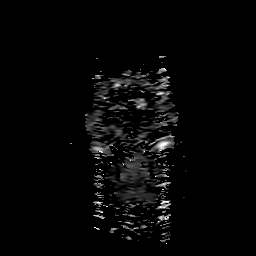

[29 of 48 positions shown; findings below may reference images not displayed]

FINDINGS: MRI HEAD FINDINGS

INTRACRANIAL CONTENTS: 1 cm rounded area of reduced diffusion RIGHT
corona radiata. 1 cm reduced diffusion RIGHT frontal periventricular
white matter/ body corpus callosum. Both areas demonstrate low ADC
values. No susceptibility artifact to suggest hemorrhage. The
ventricles and sulci are mildly prominent for patient's age. Patchy
to confluent supratentorial and pontine white matter FLAIR T2
hyperintensities. Small area LEFT mesial occipital lobe
encephalomalacia. No suspicious parenchymal signal, masses or mass
effect. No abnormal extra-axial fluid collections. No extra-axial
masses though, contrast enhanced sequences would be more sensitive.
Normal major intracranial vascular flow voids present at skull base.

ORBITS: The included ocular globes and orbital contents are
non-suspicious. Status post bilateral ocular lens implants.

SINUSES: The mastoid air-cells and included paranasal sinuses are
well-aerated. Known LEFT internal auditory canal schwannoma not
conspicuous by noncontrast examination.

SKULL/SOFT TISSUES: No abnormal sellar expansion. No suspicious
calvarial bone marrow signal. Craniocervical junction maintained.

MRA HEAD FINDINGS

ANTERIOR CIRCULATION: Normal flow related enhancement of the
included cervical, petrous, cavernous and supraclinoid internal
carotid arteries. 2 mm laterally directed wide necked aneurysm RIGHT
cavernous internal carotid artery is better seen on this high field
strength examination. Patent anterior communicating artery. Normal
flow related enhancement of the anterior and middle cerebral
arteries, including distal segments.

No large vessel occlusion, high-grade stenosis. Moderate luminal
regularity of the middle cerebral arteries.

POSTERIOR CIRCULATION: Codominant vertebral artery's. Basilar artery
is patent, with normal flow related enhancement of the main branch
vessels. Normal flow related enhancement of the posterior cerebral
arteries. Moderate stenosis proximal RIGHT P3 segment. Moderate
stenosis LEFT P3. Robust RIGHT posterior communicating artery.
Luminal irregularity of the posterior cerebral arteries.

No large vessel occlusion, aneurysm.
IMPRESSION: MRI HEAD: Acute 1 cm RIGHT corona radiata and RIGHT frontal
periventricular white matter infarcts.

Stable moderate global brain atrophy and moderate to severe chronic
small vessel ischemic disease. Old LEFT PCA territory infarct.

MRA HEAD: No emergent large vessel occlusion.

2 mm stable RIGHT cavernous internal carotid artery aneurysm.
Moderate stenoses of the posterior cerebral arteries and general
luminal irregularity of the intracranial vessels compatible with
atherosclerosis.

## 2017-06-01 ENCOUNTER — Other Ambulatory Visit: Payer: Self-pay | Admitting: Family Medicine

## 2017-06-01 ENCOUNTER — Other Ambulatory Visit: Payer: Self-pay | Admitting: Neurology

## 2017-07-24 ENCOUNTER — Encounter (HOSPITAL_COMMUNITY): Payer: Self-pay

## 2017-07-24 ENCOUNTER — Ambulatory Visit: Payer: Self-pay | Admitting: *Deleted

## 2017-07-24 ENCOUNTER — Observation Stay (HOSPITAL_COMMUNITY): Payer: Medicare Other

## 2017-07-24 ENCOUNTER — Emergency Department (HOSPITAL_COMMUNITY): Payer: Medicare Other

## 2017-07-24 ENCOUNTER — Ambulatory Visit: Payer: Self-pay | Admitting: Adult Health

## 2017-07-24 ENCOUNTER — Inpatient Hospital Stay (HOSPITAL_COMMUNITY)
Admission: EM | Admit: 2017-07-24 | Discharge: 2017-07-27 | DRG: 948 | Disposition: A | Discharge status: Skilled Nursing Facility | Payer: Medicare Other | Attending: Internal Medicine | Admitting: Internal Medicine

## 2017-07-24 ENCOUNTER — Other Ambulatory Visit: Payer: Self-pay

## 2017-07-24 DIAGNOSIS — Z95818 Presence of other cardiac implants and grafts: Secondary | ICD-10-CM | POA: Diagnosis not present

## 2017-07-24 DIAGNOSIS — F0390 Unspecified dementia without behavioral disturbance: Secondary | ICD-10-CM | POA: Diagnosis present

## 2017-07-24 DIAGNOSIS — R531 Weakness: Secondary | ICD-10-CM | POA: Diagnosis not present

## 2017-07-24 DIAGNOSIS — E860 Dehydration: Secondary | ICD-10-CM | POA: Diagnosis present

## 2017-07-24 DIAGNOSIS — R112 Nausea with vomiting, unspecified: Secondary | ICD-10-CM | POA: Diagnosis present

## 2017-07-24 DIAGNOSIS — Z91048 Other nonmedicinal substance allergy status: Secondary | ICD-10-CM | POA: Diagnosis not present

## 2017-07-24 DIAGNOSIS — E538 Deficiency of other specified B group vitamins: Secondary | ICD-10-CM | POA: Diagnosis present

## 2017-07-24 DIAGNOSIS — Z882 Allergy status to sulfonamides status: Secondary | ICD-10-CM | POA: Diagnosis not present

## 2017-07-24 DIAGNOSIS — Z853 Personal history of malignant neoplasm of breast: Secondary | ICD-10-CM

## 2017-07-24 DIAGNOSIS — I351 Nonrheumatic aortic (valve) insufficiency: Secondary | ICD-10-CM | POA: Diagnosis not present

## 2017-07-24 DIAGNOSIS — Z85841 Personal history of malignant neoplasm of brain: Secondary | ICD-10-CM

## 2017-07-24 DIAGNOSIS — Z90710 Acquired absence of both cervix and uterus: Secondary | ICD-10-CM | POA: Diagnosis not present

## 2017-07-24 DIAGNOSIS — Z7902 Long term (current) use of antithrombotics/antiplatelets: Secondary | ICD-10-CM

## 2017-07-24 DIAGNOSIS — K7689 Other specified diseases of liver: Secondary | ICD-10-CM | POA: Diagnosis not present

## 2017-07-24 DIAGNOSIS — Z91040 Latex allergy status: Secondary | ICD-10-CM

## 2017-07-24 DIAGNOSIS — I63411 Cerebral infarction due to embolism of right middle cerebral artery: Secondary | ICD-10-CM | POA: Diagnosis not present

## 2017-07-24 DIAGNOSIS — E039 Hypothyroidism, unspecified: Secondary | ICD-10-CM | POA: Diagnosis present

## 2017-07-24 DIAGNOSIS — H9192 Unspecified hearing loss, left ear: Secondary | ICD-10-CM | POA: Diagnosis present

## 2017-07-24 DIAGNOSIS — I5032 Chronic diastolic (congestive) heart failure: Secondary | ICD-10-CM | POA: Diagnosis not present

## 2017-07-24 DIAGNOSIS — R110 Nausea: Secondary | ICD-10-CM | POA: Diagnosis not present

## 2017-07-24 DIAGNOSIS — J84112 Idiopathic pulmonary fibrosis: Secondary | ICD-10-CM

## 2017-07-24 DIAGNOSIS — K219 Gastro-esophageal reflux disease without esophagitis: Secondary | ICD-10-CM | POA: Diagnosis not present

## 2017-07-24 DIAGNOSIS — I10 Essential (primary) hypertension: Secondary | ICD-10-CM | POA: Diagnosis not present

## 2017-07-24 DIAGNOSIS — Z8673 Personal history of transient ischemic attack (TIA), and cerebral infarction without residual deficits: Secondary | ICD-10-CM | POA: Diagnosis not present

## 2017-07-24 DIAGNOSIS — E876 Hypokalemia: Secondary | ICD-10-CM | POA: Diagnosis present

## 2017-07-24 DIAGNOSIS — F0150 Vascular dementia without behavioral disturbance: Secondary | ICD-10-CM | POA: Diagnosis not present

## 2017-07-24 DIAGNOSIS — M6281 Muscle weakness (generalized): Secondary | ICD-10-CM | POA: Diagnosis not present

## 2017-07-24 DIAGNOSIS — I110 Hypertensive heart disease with heart failure: Secondary | ICD-10-CM | POA: Diagnosis not present

## 2017-07-24 DIAGNOSIS — Z79899 Other long term (current) drug therapy: Secondary | ICD-10-CM

## 2017-07-24 DIAGNOSIS — K580 Irritable bowel syndrome with diarrhea: Secondary | ICD-10-CM | POA: Diagnosis not present

## 2017-07-24 DIAGNOSIS — R933 Abnormal findings on diagnostic imaging of other parts of digestive tract: Secondary | ICD-10-CM | POA: Diagnosis not present

## 2017-07-24 DIAGNOSIS — J9811 Atelectasis: Secondary | ICD-10-CM | POA: Diagnosis not present

## 2017-07-24 DIAGNOSIS — Z85038 Personal history of other malignant neoplasm of large intestine: Secondary | ICD-10-CM

## 2017-07-24 DIAGNOSIS — I69354 Hemiplegia and hemiparesis following cerebral infarction affecting left non-dominant side: Secondary | ICD-10-CM | POA: Diagnosis not present

## 2017-07-24 DIAGNOSIS — R489 Unspecified symbolic dysfunctions: Secondary | ICD-10-CM | POA: Diagnosis not present

## 2017-07-24 DIAGNOSIS — J449 Chronic obstructive pulmonary disease, unspecified: Secondary | ICD-10-CM | POA: Diagnosis not present

## 2017-07-24 DIAGNOSIS — C719 Malignant neoplasm of brain, unspecified: Secondary | ICD-10-CM | POA: Diagnosis not present

## 2017-07-24 DIAGNOSIS — E785 Hyperlipidemia, unspecified: Secondary | ICD-10-CM | POA: Diagnosis present

## 2017-07-24 DIAGNOSIS — K639 Disease of intestine, unspecified: Secondary | ICD-10-CM | POA: Diagnosis not present

## 2017-07-24 DIAGNOSIS — R51 Headache: Secondary | ICD-10-CM | POA: Diagnosis not present

## 2017-07-24 LAB — I-STAT CHEM 8, ED
BUN: 6 mg/dL (ref 6–20)
CHLORIDE: 95 mmol/L — AB (ref 101–111)
CREATININE: 0.7 mg/dL (ref 0.44–1.00)
Calcium, Ion: 1.09 mmol/L — ABNORMAL LOW (ref 1.15–1.40)
GLUCOSE: 101 mg/dL — AB (ref 65–99)
HCT: 47 % — ABNORMAL HIGH (ref 36.0–46.0)
Hemoglobin: 16 g/dL — ABNORMAL HIGH (ref 12.0–15.0)
POTASSIUM: 2.8 mmol/L — AB (ref 3.5–5.1)
Sodium: 139 mmol/L (ref 135–145)
TCO2: 30 mmol/L (ref 22–32)

## 2017-07-24 LAB — DIFFERENTIAL
BASOS ABS: 0.1 10*3/uL (ref 0.0–0.1)
BASOS PCT: 0 %
Eosinophils Absolute: 0.1 10*3/uL (ref 0.0–0.7)
Eosinophils Relative: 1 %
Lymphocytes Relative: 18 %
Lymphs Abs: 2 10*3/uL (ref 0.7–4.0)
Monocytes Absolute: 0.6 10*3/uL (ref 0.1–1.0)
Monocytes Relative: 5 %
NEUTROS PCT: 76 %
Neutro Abs: 8.6 10*3/uL — ABNORMAL HIGH (ref 1.7–7.7)

## 2017-07-24 LAB — CBC
HCT: 44.7 % (ref 36.0–46.0)
Hemoglobin: 14.8 g/dL (ref 12.0–15.0)
MCH: 29.3 pg (ref 26.0–34.0)
MCHC: 33.1 g/dL (ref 30.0–36.0)
MCV: 88.5 fL (ref 78.0–100.0)
PLATELETS: 257 10*3/uL (ref 150–400)
RBC: 5.05 MIL/uL (ref 3.87–5.11)
RDW: 15.1 % (ref 11.5–15.5)
WBC: 11.5 10*3/uL — AB (ref 4.0–10.5)

## 2017-07-24 LAB — COMPREHENSIVE METABOLIC PANEL
ALBUMIN: 3.4 g/dL — AB (ref 3.5–5.0)
ALT: 13 U/L — ABNORMAL LOW (ref 14–54)
AST: 26 U/L (ref 15–41)
Alkaline Phosphatase: 65 U/L (ref 38–126)
Anion gap: 12 (ref 5–15)
BUN: 6 mg/dL (ref 6–20)
CALCIUM: 9.1 mg/dL (ref 8.9–10.3)
CHLORIDE: 96 mmol/L — AB (ref 101–111)
CO2: 28 mmol/L (ref 22–32)
CREATININE: 0.73 mg/dL (ref 0.44–1.00)
GFR calc non Af Amer: >60 mL/min (ref >60)
GLUCOSE: 102 mg/dL — AB (ref 65–99)
Potassium: 2.8 mmol/L — ABNORMAL LOW (ref 3.5–5.1)
SODIUM: 136 mmol/L (ref 135–145)
Total Bilirubin: 0.9 mg/dL (ref 0.3–1.2)
Total Protein: 8 g/dL (ref 6.5–8.1)

## 2017-07-24 LAB — PROTIME-INR
INR: 1
PROTHROMBIN TIME: 13.1 s (ref 11.4–15.2)

## 2017-07-24 LAB — URINALYSIS, ROUTINE W REFLEX MICROSCOPIC
Bilirubin Urine: NEGATIVE
GLUCOSE, UA: NEGATIVE mg/dL
Hgb urine dipstick: NEGATIVE
Ketones, ur: NEGATIVE mg/dL
Leukocytes, UA: NEGATIVE
Nitrite: NEGATIVE
Protein, ur: NEGATIVE mg/dL
SPECIFIC GRAVITY, URINE: 1.032 — AB (ref 1.005–1.030)
pH: 7 (ref 5.0–8.0)

## 2017-07-24 LAB — I-STAT TROPONIN, ED: Troponin i, poc: 0.02 ng/mL (ref 0.00–0.08)

## 2017-07-24 LAB — APTT: APTT: 28 s (ref 24–36)

## 2017-07-24 MED ORDER — ACETAMINOPHEN 160 MG/5ML PO SOLN: 650.0000 mg | ORAL | Status: DC | PRN

## 2017-07-24 MED ORDER — IOPAMIDOL (ISOVUE-300) INJECTION 61%
INTRAVENOUS | Status: AC
  Administered 2017-07-24: 80 mL
  Filled 2017-07-24: qty 100

## 2017-07-24 MED ORDER — DONEPEZIL HCL 10 MG PO TABS
10.0000 mg | ORAL_TABLET | Freq: Every day | ORAL | Status: DC
  Administered 2017-07-25 – 2017-07-27 (×3): 10 mg via ORAL
  Filled 2017-07-24 (×3): qty 1

## 2017-07-24 MED ORDER — ESCITALOPRAM OXALATE 10 MG PO TABS
10.0000 mg | ORAL_TABLET | Freq: Every day | ORAL | Status: DC
  Administered 2017-07-25 – 2017-07-27 (×3): 10 mg via ORAL
  Filled 2017-07-24 (×3): qty 1

## 2017-07-24 MED ORDER — AMLODIPINE BESYLATE 5 MG PO TABS
2.5000 mg | ORAL_TABLET | Freq: Every day | ORAL | Status: DC
  Administered 2017-07-25 – 2017-07-26 (×2): 2.5 mg via ORAL
  Filled 2017-07-24 (×2): qty 1

## 2017-07-24 MED ORDER — SODIUM CHLORIDE 0.9 % IV BOLUS (SEPSIS)
1000.0000 mL | Freq: Once | INTRAVENOUS | Status: AC
  Administered 2017-07-24: 1000 mL via INTRAVENOUS

## 2017-07-24 MED ORDER — CLOPIDOGREL BISULFATE 75 MG PO TABS
75.0000 mg | ORAL_TABLET | Freq: Every day | ORAL | Status: DC
  Administered 2017-07-25 – 2017-07-27 (×3): 75 mg via ORAL
  Filled 2017-07-24 (×3): qty 1

## 2017-07-24 MED ORDER — ACETAMINOPHEN 325 MG PO TABS: 650.0000 mg | ORAL_TABLET | ORAL | Status: DC | PRN

## 2017-07-24 MED ORDER — ONDANSETRON HCL 4 MG/2ML IJ SOLN
4.0000 mg | Freq: Once | INTRAMUSCULAR | Status: AC
  Administered 2017-07-24: 4 mg via INTRAVENOUS
  Filled 2017-07-24: qty 2

## 2017-07-24 MED ORDER — ATORVASTATIN CALCIUM 20 MG PO TABS
20.0000 mg | ORAL_TABLET | Freq: Every day | ORAL | Status: DC
  Administered 2017-07-25 – 2017-07-27 (×3): 20 mg via ORAL
  Filled 2017-07-24: qty 2
  Filled 2017-07-24 (×2): qty 1
  Filled 2017-07-24 (×2): qty 2
  Filled 2017-07-24: qty 1

## 2017-07-24 MED ORDER — ACETAMINOPHEN 650 MG RE SUPP: 650.0000 mg | RECTAL | Status: DC | PRN

## 2017-07-24 MED ORDER — STROKE: EARLY STAGES OF RECOVERY BOOK
Freq: Once | Status: DC
  Filled 2017-07-24: qty 1

## 2017-07-24 MED ORDER — POTASSIUM CHLORIDE 10 MEQ/100ML IV SOLN
10.0000 meq | Freq: Once | INTRAVENOUS | Status: AC
  Administered 2017-07-24: 10 meq via INTRAVENOUS
  Filled 2017-07-24: qty 100

## 2017-07-24 MED ORDER — POTASSIUM CHLORIDE CRYS ER 20 MEQ PO TBCR
40.0000 meq | EXTENDED_RELEASE_TABLET | Freq: Once | ORAL | Status: DC
  Filled 2017-07-24: qty 2

## 2017-07-24 MED ORDER — SODIUM CHLORIDE 0.9 % IV SOLN: INTRAVENOUS | Status: AC

## 2017-07-24 MED ORDER — PROPRANOLOL HCL 20 MG PO TABS
20.0000 mg | ORAL_TABLET | Freq: Every day | ORAL | Status: DC
  Administered 2017-07-25 – 2017-07-27 (×3): 20 mg via ORAL
  Filled 2017-07-24 (×3): qty 1

## 2017-07-24 MED ORDER — SENNOSIDES-DOCUSATE SODIUM 8.6-50 MG PO TABS: 1.0000 | ORAL_TABLET | Freq: Every evening | ORAL | Status: DC | PRN

## 2017-07-24 MED ORDER — PANTOPRAZOLE SODIUM 40 MG PO TBEC
40.0000 mg | DELAYED_RELEASE_TABLET | Freq: Every day | ORAL | Status: DC
  Administered 2017-07-25 – 2017-07-27 (×3): 40 mg via ORAL
  Filled 2017-07-24 (×3): qty 1

## 2017-07-24 MED ORDER — NINTEDANIB ESYLATE 100 MG PO CAPS
100.0000 mg | ORAL_CAPSULE | Freq: Two times a day (BID) | ORAL | Status: DC
  Administered 2017-07-25 – 2017-07-27 (×5): 100 mg via ORAL
  Filled 2017-07-24 (×6): qty 1

## 2017-07-24 MED ORDER — ASPIRIN 325 MG PO TABS
325.0000 mg | ORAL_TABLET | Freq: Every day | ORAL | Status: DC
  Administered 2017-07-25: 325 mg via ORAL
  Filled 2017-07-24: qty 1

## 2017-07-24 MED ORDER — ALBUTEROL SULFATE (2.5 MG/3ML) 0.083% IN NEBU: 2.5000 mg | INHALATION_SOLUTION | RESPIRATORY_TRACT | Status: DC | PRN

## 2017-07-24 MED ORDER — ASPIRIN 81 MG PO CHEW: 324.0000 mg | CHEWABLE_TABLET | Freq: Once | ORAL | Status: DC

## 2017-07-24 NOTE — ED Triage Notes (Signed)
Patient complains of ongoing nausea that she has had for months per patient. Son reports last night patient appeared weak with lean to left and trouble ambulating upstairs to bed. Family found patient still dressed in clothes this am and was seeking help to get down the stairs. Patient alert and oriented, slight arm drift and facial droop noted. Patients son reports that this is normal from old stroke. VSS

## 2017-07-24 NOTE — ED Notes (Signed)
Pt back from CT

## 2017-07-24 NOTE — ED Provider Notes (Signed)
[facility] EMERGENCY DEPARTMENT Provider Note   CSN: [id number] Arrival date & time: 07/24/17  1205     History   Chief Complaint Chief Complaint  Patient presents with  . Weakness    HPI Carmen Duncan is a 77 y.o. female.  HPI 77 year old Caucasian female past medical history significant for hypertension, IBS, dementia, stroke that presents to the emergency department today with family at bedside for evaluation of generalized weakness, vomiting and diarrhea.  Son at bedside states that last night he noticed that patient was seeming to be more weaker than usual.  States that she was favoring her right side while walking.  Patient does live at home with son and daughter-in-law.  States that she is normally able to get around by herself at baseline however over the past 2-3 days she is been weaker than usual.  Patient states that she does not feel good.  Son also reports that this morning she had to call for help to get down the stairs as she was not able to make it.  Patient does have a history of CVA at baseline but no any known deficits.  They do report the patient will intermittently have episodes of vomiting however over the past week she has had more vomiting and diarrhea than normal.  Patient has had poor p.o. intake.  Patient denies any associated pain.  Denies any associated fevers, chills, urinary symptoms, abdominal pain, chest pain or shortness of breath.  Patient denies lightheadedness, dizziness, syncope. Past Medical History:  Diagnosis Date  . Acoustic neuroma ([place])   . Anxiety   . Arthritis   . Asthma   . Brain cancer ([place])   . Breast cancer of upper-outer quadrant of right female breast ([place]) 07/19/2015  . Diverticulosis   . Ejection fraction   . Essential tremor    on inderal  . GERD (gastroesophageal reflux disease)   . HOH (hard of hearing) left ear  . Hyperlipidemia   . Hypertension   . IBS (irritable bowel syndrome)   . Interstitial lung  disease ([place])   . Pneumonia    04-2014, CAP  . Stroke [facility])    no deficits, on plavix    Patient Active Problem List   Diagnosis Date Noted  . Weak 07/24/2017  . Vascular dementia without behavioral disturbance 03/14/2016  . Asthma   . CVA (cerebral infarction) 11/17/2015  . Left hemiparesis ([place])   . Acute CVA (cerebrovascular accident) ([place]) 11/15/2015  . CVA (cerebral vascular accident) ([place]) 11/14/2015  . Left-sided muscle weakness 11/14/2015  . Left-sided weakness 11/14/2015  . Breast cancer of upper-outer quadrant of right female breast ([place]) 07/19/2015  . B12 deficiency 03/14/2015  . Bilateral carotid artery disease ([place]) 10/07/2014  . Pulmonary hypertension ([place]) 10/07/2014  . Ejection fraction   . 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 Oral candidiasis 04/28/2014  . Chronic diastolic congestive heart failure ([place]) 04/28/2014  . IPF (idiopathic pulmonary fibrosis) ([place]) 04/21/2014  . Hyponatremia 04/19/2014  . Thrombocytopenia ([place]) 04/19/2014  . Malnutrition of moderate degree ([place]) 04/19/2014  . Hypertension 04/18/2014  . Hyperlipidemia 04/18/2014  . Hypovolemia due to dehydration 04/18/2014  . Dyspnea on exertion 04/04/2014  . Abnormal nuclear stress test 04/04/2014  . HOH (hard of hearing)   . Raynaud's phenomenon 03/21/2014  . Tremor, essential 03/12/2013  . Unspecified hereditary and idiopathic peripheral neuropathy 08/04/2012  . Squamous cell skin cancer 08/19/2011  . 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 Dyslipidemia 04/03/2010  . Essential  hypertension 04/03/2010  . GERD 04/03/2010  . IBS 04/03/2010  . DEEP VENOUS THROMBOPHLEBITIS, HX OF 04/03/2010  . DIVERTICULITIS, HX OF 04/03/2010    Past Surgical History:  Procedure Laterality Date  . ABDOMINAL HYSTERECTOMY  1980  . BIOPSY BREAST    . BREAST LUMPECTOMY WITH RADIOACTIVE SEED AND SENTINEL LYMPH NODE BIOPSY Right 08/10/2015   Procedure: BREAST LUMPECTOMY WITH RADIOACTIVE SEED AND SENTINEL LYMPH NODE  BIOPSY;  Surgeon: Autumn Messing III, MD;  Location: [facility];  Service: General;  Laterality: Right;  . CHOLECYSTECTOMY  2011  . EP IMPLANTABLE DEVICE N/A 11/16/2015   Procedure: Loop Recorder Insertion;  Surgeon: [name], MD;  Location: [facility] CV LAB;  Service: Cardiovascular;  Laterality: N/A;  . eyes  2007   cararacts  . TEE WITHOUT CARDIOVERSION N/A 09/05/2014   Procedure: TRANSESOPHAGEAL ECHOCARDIOGRAM (TEE);  Surgeon: Lelon Perla, MD;  Location: [facility] ENDOSCOPY;  Service: Cardiovascular;  Laterality: N/A;  . TEE WITHOUT CARDIOVERSION N/A 11/16/2015   Procedure: TRANSESOPHAGEAL ECHOCARDIOGRAM (TEE);  Surgeon: Larey Dresser, MD;  Location: [facility];  Service: Cardiovascular;  Laterality: N/A;  . TONSILLECTOMY  1945    OB History    No data available       Home Medications    Prior to Admission medications   Medication Sig Start Date End Date Taking? Authorizing Provider  acetaminophen (TYLENOL) 325 MG tablet Take 325 mg by mouth every 6 (six) hours as needed for headache.     [provider]  amLODipine (NORVASC) 2.5 MG tablet TAKE 1 TABLET BY MOUTH ONCE DAILY 06/02/17   Burchette, Alinda Sierras, MD  atorvastatin (LIPITOR) 20 MG tablet Take 1 tablet (20 mg total) by mouth daily. 11/27/15   Angiulli, Lavon Paganini, PA-C  atorvastatin (LIPITOR) 20 MG tablet TAKE 1 TABLET BY MOUTH ONCE DAILY 06/02/17   Burchette, Alinda Sierras, MD  clopidogrel (PLAVIX) 75 MG tablet TAKE ONE TABLET BY MOUTH ONCE DAILY 12/11/16   Tat, Eustace Quail, DO  donepezil (ARICEPT) 5 MG tablet Take 1 tablet (5 mg total) by mouth at bedtime. 01/29/17   Tat, Eustace Quail, DO  escitalopram (LEXAPRO) 10 MG tablet TAKE 1 TABLET BY MOUTH ONCE DAILY. 03/10/17   Tat, Eustace Quail, DO  Nintedanib ([name]) 100 MG CAPS Take 100 mg by mouth 2 (two) times daily.    [provider]  omeprazole (PRILOSEC) 20 MG capsule Take 1 capsule (20 mg total) by mouth daily. 10/15/16   Burchette, Alinda Sierras, MD  ondansetron (ZOFRAN)  4 MG tablet Take 1 tablet (4 mg total) by mouth every 8 (eight) hours as needed for nausea or vomiting. 03/29/16   Rigoberto Noel, MD  propranolol (INDERAL) 20 MG tablet TAKE 1 TABLET BY MOUTH ONCE DAILY 06/02/17   Tat, Eustace Quail, DO    Family History Family History  Problem Relation Age of Onset  . Hyperlipidemia Mother   . Hypertension Mother   . Hyperlipidemia Father   . Heart disease Father 46  . Stroke Father   . Lung cancer Paternal Uncle        x 4    Social History Social History   Tobacco Use  . Smoking status: Never Smoker  . Smokeless tobacco: Never Used  Substance Use Topics  . Alcohol use: Yes    Alcohol/week: 0.0 oz    Comment: wine about 2 times a week   . Drug use: No     Allergies   Latex; Sulfonamide derivatives; and  Tape   Review of Systems Review of Systems  All other systems reviewed and are negative.    Physical Exam Updated Vital Signs BP (!) 166/68   Pulse 72   Temp 97.7 F (36.5 C) (Oral)   Resp 15   SpO2 98%   Physical Exam  Constitutional: She is oriented to person, place, and time. She appears well-developed.  Non-toxic appearance. No distress.  Patient is frail and elderly.  HENT:  Head: Normocephalic and atraumatic.  Nose: Nose normal.  Mouth/Throat: Oropharynx is clear and moist.  Eyes: Conjunctivae are normal. Pupils are equal, round, and reactive to light. Right eye exhibits no discharge. Left eye exhibits no discharge.  Neck: Normal range of motion. Neck supple.  Cardiovascular: Normal rate, regular rhythm, normal heart sounds and intact distal pulses. Exam reveals no gallop and no friction rub.  No murmur heard. Pulmonary/Chest: Effort normal and breath sounds normal. No stridor. No respiratory distress. She has no wheezes. She has no rales. She exhibits no tenderness.  Abdominal: Soft. Bowel sounds are normal. There is generalized tenderness. There is no rigidity, no rebound, no guarding, no CVA tenderness, no tenderness at  McBurney's point and negative Murphy's sign.  Musculoskeletal: Normal range of motion. She exhibits no tenderness.  Lymphadenopathy:    She has no cervical adenopathy.  Neurological: She is alert and oriented to person, place, and time.  Patient does have some mild left-sided weakness with grip strength and left leg raise.  5 out of 5 on the right.  4 out of 5 on the left.  Patient has no drift.  She does have good finger to nose without any ataxia.  Cranial nerves II through XII appear intact.  Moves all extremities.  Follows commands appropriately.  Oriented to person and place but not time which is baseline for patient given her dementia.  Skin: Skin is warm and dry. Capillary refill takes less than 2 seconds.  Psychiatric: Her behavior is normal. Judgment and thought content normal.  Nursing note and vitals reviewed.    ED Treatments / Results  Labs (all labs ordered are listed, but only abnormal results are displayed) Labs Reviewed  CBC - Abnormal; Notable for the following components:      Result Value   WBC 11.5 (*)    All other components within normal limits  DIFFERENTIAL - Abnormal; Notable for the following components:   Neutro Abs 8.6 (*)    All other components within normal limits  COMPREHENSIVE METABOLIC PANEL - Abnormal; Notable for the following components:   Potassium 2.8 (*)    Chloride 96 (*)    Glucose, Bld 102 (*)    Albumin 3.4 (*)    ALT 13 (*)    All other components within normal limits  I-STAT CHEM 8, ED - Abnormal; Notable for the following components:   Potassium 2.8 (*)    Chloride 95 (*)    Glucose, Bld 101 (*)    Calcium, Ion 1.09 (*)    Hemoglobin 16.0 (*)    HCT 47.0 (*)    All other components within normal limits  PROTIME-INR  APTT  URINALYSIS, ROUTINE W REFLEX MICROSCOPIC  I-STAT TROPONIN, ED    EKG  EKG Interpretation None       Radiology Dg Chest 2 View  Result Date: 07/24/2017 CLINICAL DATA:  Nausea for months, weakness,  leaning to LEFT, difficulty ambulating up stairs to bed, old stroke, RIGHT breast cancer, hypertension, interstitial lung disease EXAM: CHEST  2  VIEW COMPARISON:  11/14/2015 FINDINGS: Enlargement of cardiac silhouette. Loop recorder projects over lower anterior LEFT chest. Mediastinal contours and pulmonary vascularity normal. Emphysematous and peripheral chronic interstitial lung disease changes of the mid to lower lungs bilaterally. Superimposed bibasilar atelectasis. No definite acute infiltrate, pleural effusion or pneumothorax. Diffuse osseous demineralization. IMPRESSION: Changes of COPD and chronic interstitial lung disease with bibasilar atelectasis. Enlargement of cardiac silhouette. Electronically Signed   By: Lavonia Dana M.D.   On: 07/24/2017 15:29   Ct Head Wo Contrast  Result Date: 07/24/2017 CLINICAL DATA:  Headache, vomiting, dizziness, history of breast carcinoma EXAM: CT HEAD WITHOUT CONTRAST TECHNIQUE: Contiguous axial images were obtained from the base of the skull through the vertex without intravenous contrast. COMPARISON:  CT brain scan of 12/11/2015 FINDINGS: Brain: The ventricular system is prominent as are the cortical sulci, consistent with diffuse atrophy. Moderately severe small vessel ischemic changes again noted throughout the periventricular white matter with very little interval change. The septum is midline in position. No hemorrhage, mass lesion, or acute infarction is seen. Vascular: No vascular abnormality is noted on this unenhanced study. Skull: On bone window images, no calvarial abnormality is seen. Sinuses/Orbits: The paranasal sinuses are well pneumatized. Other: None. IMPRESSION: 1. No acute intracranial abnormality. 2. No significant interval change in the degree of atrophy and moderately severe small vessel ischemic change within the periventricular white matter. Electronically Signed   By: Ivar Drape M.D.   On: 07/24/2017 13:05    Procedures Procedures  (including critical care time)  Medications Ordered in ED Medications  sodium chloride 0.9 % bolus 1,000 mL (not administered)  potassium chloride 10 mEq in 100 mL IVPB (not administered)  potassium chloride SA (K-DUR,KLOR-CON) CR tablet 40 mEq (not administered)  ondansetron (ZOFRAN) injection 4 mg (4 mg Intravenous Given 07/24/17 1455)  iopamidol (ISOVUE-300) 61 % injection (80 mLs  Contrast Given 07/24/17 1511)     Initial Impression / Assessment and Plan / ED Course  I have reviewed the triage vital signs and the nursing notes.  Pertinent labs & imaging results that were available during my care of the patient were reviewed by me and considered in my medical decision making (see chart for details).     Patient presents the ED with family at bedside with claims of generalized weakness with associated nausea and emesis that is worse over the past week.  Patient with history of dementia is at baseline per family.  Patient overall well-appearing and nontoxic.  Vital signs are reassuring.  She is afebrile, no tachycardia or hypotension is noted.  Lungs clear to auscultation bilaterally.  Heart regular rate and rhythm.  She does have generalized abdominal pain to palpation but no signs of focal tenderness.  No peritoneal signs.  No CVA tenderness.  Neurological exam appears to be at patient's baseline.  She does have some mild left-sided weakness that family was unsure if this was due to prior stroke or not.  She has no other focal deficits noted.  She is oriented to person and place which is baseline.  Patient's lab work reveals a potassium of 2.8.  Otherwise elect lites are reassuring.  Troponin was negative.  Normal kidney function.  Chest x-ray shows no signs of focal infiltrate.  CT of head was performed in triage shows no acute ab normalities.  Given patient's diarrhea and vomiting with generalized abdominal tenderness CT scan is pending at this time to rule out any colitis that would need  antibiotic.  Unsure of  patient's left-sided weakness that this is acute or chronic.  Patient is outside the window for any acute stroke treatment.   Given patient's persistent vomiting and diarrhea with hypokalemia felt that patient would benefit from hospital admission for IV hydration and possible antibiotics.  With patient would also probably benefit from an MRI to rule out any acute stroke.  This patient with Dr. Aggie Moats with hospital medicine who agrees to admission will see patient in the ED and place admission orders.  Patient remains hemodynamically stable at this time.  Family and patient was updated on plan of care.  Dicussed plan of care with Dr. Sandi Mariscal who is agreeable with the above plan.  Final Clinical Impressions(s) / ED Diagnoses   Final diagnoses:  Weakness  Dehydration    ED Discharge Orders    None       Aaron Edelman 07/24/17 1606    [place]ack[place], Fredia Sorrow, MD 07/25/17 [phone]

## 2017-07-24 NOTE — H&P (Signed)
[facility] History and Physical  GOLDY CALANDRA ZOX:[id number] DOB: Oct 12, 1940 DOA: 07/24/2017  Referring physician:  PCP: [name], MD   Chief Complaint: "She vomited a lot." - son  HPI: Carmen Duncan is a 77 y.o. female past medical history of acoustic neuroma, asthma, brain cancer, breast cancer, high blood pressure, interstitial lung disease and history of stroke presents emergency room with weakness.  Per patient's son who gives most of the history patient has been having gradual weakening over several weeks now.  She has a walker and cane but has been cruising along furniture.  She got acutely worse over the last week after several days of regular vomiting.  No diarrhea.  No sick contacts.  No recent medication changes.  Patient became noticeably weak between 5 PM and 10 PM today for admission.  Patient needed help getting up the stairs to go to her room.  In the morning patient needed help coming back down the stairs.  This concerned the family who brought the patient in for evaluation.  Note, patient has history of stroke and main presenting symptom was weakness.  Otherwise nonfocal.  ED Course: Patient weak on exam by EDP.  CT of the chest done with results pending.  Labs with low potassium but no other acute abnormalities.   Review of Systems:  As per HPI otherwise 10 point review of systems negative.    Past Medical History:  Diagnosis Date  . Acoustic neuroma ([place])   . Anxiety   . Arthritis   . Asthma   . Brain cancer ([place])   . Breast cancer of upper-outer quadrant of right female breast ([place]) 07/19/2015  . Diverticulosis   . Ejection fraction   . Essential tremor    on inderal  . GERD (gastroesophageal reflux disease)   . HOH (hard of hearing) left ear  . Hyperlipidemia   . Hypertension   . IBS (irritable bowel syndrome)   . Interstitial lung disease ([place])   . Pneumonia    04-2014, CAP  . Stroke [facility])    no deficits, on plavix   Past Surgical  History:  Procedure Laterality Date  . ABDOMINAL HYSTERECTOMY  1980  . BIOPSY BREAST    . BREAST LUMPECTOMY WITH RADIOACTIVE SEED AND SENTINEL LYMPH NODE BIOPSY Right 08/10/2015   Procedure: BREAST LUMPECTOMY WITH RADIOACTIVE SEED AND SENTINEL LYMPH NODE BIOPSY;  Surgeon: Autumn Messing III, MD;  Location: [place];  Service: General;  Laterality: Right;  . CHOLECYSTECTOMY  2011  . EP IMPLANTABLE DEVICE N/A 11/16/2015   Procedure: Loop Recorder Insertion;  Surgeon: [name], MD;  Location: [place] [facility];  Service: Cardiovascular;  Laterality: N/A;  . eyes  2007   cararacts  . TEE WITHOUT CARDIOVERSION N/A 09/05/2014   Procedure: TRANSESOPHAGEAL ECHOCARDIOGRAM (TEE);  Surgeon: Lelon Perla, MD;  Location[facility]COPY;  Service: Cardiovascular;  Laterality: N/A;  . TEE WITHOUT CARDIOVERSION N/A 11/16/2015   Procedure: TRANSESOPHAGEAL ECHOCARDIOGRAM (TEE);  Surgeon: Larey Dresser, MD;  Location: [facility] ENDOSCOPY;  Service: Cardiovascular;  Laterality: N/A;  . TONSILLECTOMY  1945   Social History:  reports that  has never smoked. she has never used smokeless tobacco. She reports that she drinks alcohol. She reports that she does not use drugs.  Allergies  Allergen Reactions  . Latex Itching, Rash and Other (See Comments)    Red angry skin from latex tape  . Sulfonamide Derivatives Rash    hives  . Tape Itching  and Rash    Please use paper tape or coban wrap!!    Family History  Problem Relation Age of Onset  . Hyperlipidemia Mother   . Hypertension Mother   . Hyperlipidemia Father   . Heart disease Father 62  . Stroke Father   . Lung cancer Paternal Uncle        x 4     Prior to Admission medications   Medication Sig Start Date End Date Taking? Authorizing Provider  acetaminophen (TYLENOL) 325 MG tablet Take 325 mg by mouth every 6 (six) hours as needed for headache.     [provider]  amLODipine (NORVASC) 2.5 MG tablet TAKE 1 TABLET BY MOUTH ONCE  DAILY 06/02/17   Burchette, Alinda Sierras, MD  atorvastatin (LIPITOR) 20 MG tablet Take 1 tablet (20 mg total) by mouth daily. 11/27/15   Angiulli, Lavon Paganini, PA-C  atorvastatin (LIPITOR) 20 MG tablet TAKE 1 TABLET BY MOUTH ONCE DAILY 06/02/17   Burchette, Alinda Sierras, MD  clopidogrel (PLAVIX) 75 MG tablet TAKE ONE TABLET BY MOUTH ONCE DAILY 12/11/16   Tat, Eustace Quail, DO  donepezil (ARICEPT) 5 MG tablet Take 1 tablet (5 mg total) by mouth at bedtime. 01/29/17   Tat, Eustace Quail, DO  escitalopram (LEXAPRO) 10 MG tablet TAKE 1 TABLET BY MOUTH ONCE DAILY. 03/10/17   Tat, Eustace Quail, DO  Nintedanib ([name]) 100 MG CAPS Take 100 mg by mouth 2 (two) times daily.    [provider]  omeprazole (PRILOSEC) 20 MG capsule Take 1 capsule (20 mg total) by mouth daily. 10/15/16   Burchette, Alinda Sierras, MD  ondansetron (ZOFRAN) 4 MG tablet Take 1 tablet (4 mg total) by mouth every 8 (eight) hours as needed for nausea or vomiting. 03/29/16   Rigoberto Noel, MD  propranolol (INDERAL) 20 MG tablet TAKE 1 TABLET BY MOUTH ONCE DAILY 06/02/17   Tat, Eustace Quail, DO   Physical Exam: Vitals:   07/24/17 1339 07/24/17 1352 07/24/17 1400 07/24/17 1459  BP: (!) 160/73  (!) 166/68   Pulse:  63 60 72  Resp: 19 18 18 15   Temp:      TempSrc:      SpO2:  96% 97% 98%    Wt Readings from Last 3 Encounters:  04/16/17 52.8 kg (116 lb 8 oz)  12/19/16 51.7 kg (114 lb)  10/15/16 54.1 kg (119 lb 4.8 oz)    General:  Appears calm and comfortable; A&Ox3 Eyes:  PERRL, EOMI, normal lids, iris ENT:  grossly normal hearing, lips & tongue Neck:  no LAD, masses or thyromegaly Cardiovascular:  RRR, no m/r/g. No LE edema.  Respiratory:  Diffuse rales. Normal respiratory effort. Abdomen:  soft, ntnd Skin:  no rash or induration seen on limited exam Musculoskeletal:  grossly normal tone BUE/BLE Psychiatric:  grossly normal mood and affect, speech fluent and appropriate Neurologic:  CN 2-12 grossly intact, moves all extremities in coordinated fashion.           Labs on Admission:  Basic Metabolic Panel: Recent Labs  Lab 07/24/17 1221 07/24/17 1254  NA 136 139  K 2.8* 2.8*  CL 96* 95*  CO2 28  --   GLUCOSE 102* 101*  BUN 6 6  CREATININE 0.73 0.70  CALCIUM 9.1  --    Liver Function Tests: Recent Labs  Lab 07/24/17 1221  AST 26  ALT 13*  ALKPHOS 65  BILITOT 0.9  PROT 8.0  ALBUMIN 3.4*   No results for input(s):  LIPASE, AMYLASE in the last 168 hours. No results for input(s): AMMONIA in the last 168 hours. CBC: Recent Labs  Lab 07/24/17 1221 07/24/17 1254  WBC 11.5*  --   NEUTROABS 8.6*  --   HGB 14.8 16.0*  HCT 44.7 47.0*  MCV 88.5  --   PLT 257  --    Cardiac Enzymes: No results for input(s): CKTOTAL, CKMB, CKMBINDEX, TROPONINI in the last 168 hours.  BNP (last 3 results) No results for input(s): BNP in the last 8760 hours.  ProBNP (last 3 results) No results for input(s): PROBNP in the last 8760 hours.   Creatinine clearance cannot be calculated (Unknown ideal weight.)  CBG: No results for input(s): GLUCAP in the last 168 hours.  Radiological Exams on Admission: Dg Chest 2 View  Result Date: 07/24/2017 CLINICAL DATA:  Nausea for months, weakness, leaning to LEFT, difficulty ambulating up stairs to bed, old stroke, RIGHT breast cancer, hypertension, interstitial lung disease EXAM: CHEST  2 VIEW COMPARISON:  11/14/2015 FINDINGS: Enlargement of cardiac silhouette. Loop recorder projects over lower anterior LEFT chest. Mediastinal contours and pulmonary vascularity normal. Emphysematous and peripheral chronic interstitial lung disease changes of the mid to lower lungs bilaterally. Superimposed bibasilar atelectasis. No definite acute infiltrate, pleural effusion or pneumothorax. Diffuse osseous demineralization. IMPRESSION: Changes of COPD and chronic interstitial lung disease with bibasilar atelectasis. Enlargement of cardiac silhouette. Electronically Signed   By: Lavonia Dana M.D.   On: 07/24/2017 15:29   Ct  Head Wo Contrast  Result Date: 07/24/2017 CLINICAL DATA:  Headache, vomiting, dizziness, history of breast carcinoma EXAM: CT HEAD WITHOUT CONTRAST TECHNIQUE: Contiguous axial images were obtained from the base of the skull through the vertex without intravenous contrast. COMPARISON:  CT brain scan of 12/11/2015 FINDINGS: Brain: The ventricular system is prominent as are the cortical sulci, consistent with diffuse atrophy. Moderately severe small vessel ischemic changes again noted throughout the periventricular white matter with very little interval change. The septum is midline in position. No hemorrhage, mass lesion, or acute infarction is seen. Vascular: No vascular abnormality is noted on this unenhanced study. Skull: On bone window images, no calvarial abnormality is seen. Sinuses/Orbits: The paranasal sinuses are well pneumatized. Other: None. IMPRESSION: 1. No acute intracranial abnormality. 2. No significant interval change in the degree of atrophy and moderately severe small vessel ischemic change within the periventricular white matter. Electronically Signed   By: Ivar Drape M.D.   On: 07/24/2017 13:05    EKG: Independently reviewed. NSR.  Assessment/Plan Active Problems:   Weak   Weakness CT head negative for acute bleed MRI ordered Aspirin full dose given  And daily A1c ordered Lipid panel ordered Admitted to telemetry bed Echo ordered Carotid Dopplers ordered MRA ordered Stroke team to follow patient UA pending PT/OT/SLP   Asthma/ILD Prn albuterol Cont ofev  Hypertension When necessary hydralazine 10 mg IV as needed for severe blood pressure Cont norvasc, inderal  Abn abd/pel CT Needs OP f/u   HX of stroke Cont plavix  HX of intermittent vomiting Consider OP referral to GI as an OP  Hyperlipidemia Continue statin  Dementia Cont aricept, lexapro  GERD Cont PPI  Code Status: FC  DVT Prophylaxis: heparin Family Communication: son at  bedside Disposition Plan: Pending Improvement  Status: tele, obs  Elwin Mocha, MD Family Medicine [facility] [email or web address] Password [email or web address]

## 2017-07-24 NOTE — ED Notes (Signed)
Patient transported to X-ray 

## 2017-07-24 NOTE — Telephone Encounter (Signed)
Spoke to [name] (son) and advised that she be taken to the emergency room due to sx listed in note.  Possible that she could be having a stroke or suffering from dehydration.  Possibly both.  Informed Mitch there is nothing that we can do to treat those in office.  [name] has agreed to take patient to emergency room.  No further action required.

## 2017-07-24 NOTE — Telephone Encounter (Signed)
Son is calling about his mother- she was stable in her normal behavior and activity at 5 pm yesterday. By 10 pm last night at bedtime - patient was having trouble walking and she had slept in her clothing last night. He states she was leaning more to the left and had difficulty walking when he helped her to her room.No facial changes or slurred speech reported. He is concerned about dehydration- patient has had some lose stools and vomiting. Patient did had vomiting yesterday or the day before- more frequent in the last 7 days. Son states he did consider taking her to the ED- but did not think this was an emergent situation and wanted her seen at her PCP instead.  Reason for Disposition . Patient sounds very sick or weak to the triager  Answer Assessment - Initial Assessment Questions 1. SYMPTOM: "What is the main symptom you are concerned about?" (e.g., weakness, numbness)     weakness 2. ONSET: "When did this start?" (minutes, hours, days; while sleeping)     Last night- physical 3. LAST NORMAL: "When was the last time you were normal (no symptoms)?"     Last night 4. PATTERN "Does this come and go, or has it been constant since it started?"  "Is it present now?"     constant 5. CARDIAC SYMPTOMS: "Have you had any of the following symptoms: chest pain, difficulty breathing, palpitations?"     no 6. NEUROLOGIC SYMPTOMS: "Have you had any of the following symptoms: headache, dizziness, vision loss, double vision, changes in speech, unsteady on your feet?"     Unsteady on feet, nausea 7. OTHER SYMPTOMS: "Do you have any other symptoms?"     dehydration 8. PREGNANCY: "Is there any chance you are pregnant?" "When was your last menstrual period?"     n/a  Protocols used: NEUROLOGIC DEFICIT-A-AH

## 2017-07-25 ENCOUNTER — Observation Stay (HOSPITAL_BASED_OUTPATIENT_CLINIC_OR_DEPARTMENT_OTHER): Payer: Medicare Other

## 2017-07-25 ENCOUNTER — Observation Stay (HOSPITAL_COMMUNITY): Payer: Medicare Other

## 2017-07-25 DIAGNOSIS — R531 Weakness: Principal | ICD-10-CM

## 2017-07-25 DIAGNOSIS — E876 Hypokalemia: Secondary | ICD-10-CM | POA: Diagnosis not present

## 2017-07-25 DIAGNOSIS — R933 Abnormal findings on diagnostic imaging of other parts of digestive tract: Secondary | ICD-10-CM | POA: Diagnosis not present

## 2017-07-25 DIAGNOSIS — E538 Deficiency of other specified B group vitamins: Secondary | ICD-10-CM | POA: Diagnosis not present

## 2017-07-25 DIAGNOSIS — E860 Dehydration: Secondary | ICD-10-CM | POA: Diagnosis not present

## 2017-07-25 DIAGNOSIS — I10 Essential (primary) hypertension: Secondary | ICD-10-CM

## 2017-07-25 DIAGNOSIS — K639 Disease of intestine, unspecified: Secondary | ICD-10-CM

## 2017-07-25 DIAGNOSIS — F0390 Unspecified dementia without behavioral disturbance: Secondary | ICD-10-CM

## 2017-07-25 LAB — BASIC METABOLIC PANEL
ANION GAP: 11 (ref 5–15)
BUN: 6 mg/dL (ref 6–20)
CALCIUM: 8.6 mg/dL — AB (ref 8.9–10.3)
CHLORIDE: 99 mmol/L — AB (ref 101–111)
CO2: 27 mmol/L (ref 22–32)
CREATININE: 0.68 mg/dL (ref 0.44–1.00)
GFR calc Af Amer: >60 mL/min (ref >60)
GFR calc non Af Amer: >60 mL/min (ref >60)
Glucose, Bld: 94 mg/dL (ref 65–99)
Potassium: 2.6 mmol/L — CL (ref 3.5–5.1)
SODIUM: 137 mmol/L (ref 135–145)

## 2017-07-25 LAB — MAGNESIUM: MAGNESIUM: 1.6 mg/dL — AB (ref 1.7–2.4)

## 2017-07-25 LAB — LIPID PANEL
CHOL/HDL RATIO: 4.4 ratio
Cholesterol: 142 mg/dL (ref 0–200)
HDL: 32 mg/dL — AB (ref >40)
LDL CALC: 86 mg/dL (ref 0–99)
Triglycerides: 120 mg/dL (ref <150)
VLDL: 24 mg/dL (ref 0–40)

## 2017-07-25 LAB — HEMOGLOBIN A1C
HEMOGLOBIN A1C: 5.4 % (ref 4.8–5.6)
Mean Plasma Glucose: 108.28 mg/dL

## 2017-07-25 MED ORDER — POTASSIUM CHLORIDE CRYS ER 20 MEQ PO TBCR
40.0000 meq | EXTENDED_RELEASE_TABLET | ORAL | Status: AC
  Administered 2017-07-25 (×2): 40 meq via ORAL
  Filled 2017-07-25 (×2): qty 2

## 2017-07-25 MED ORDER — POTASSIUM CHLORIDE IN NACL 40-0.9 MEQ/L-% IV SOLN
INTRAVENOUS | Status: DC
  Administered 2017-07-25 (×2): 75 mL/h via INTRAVENOUS
  Administered 2017-07-26: 50 mL/h via INTRAVENOUS
  Filled 2017-07-25 (×4): qty 1000

## 2017-07-25 NOTE — Progress Notes (Signed)
Preliminary notes by tech--Carotid duplex exam completed. Bilateral vertebral arteries antegrade flow. Carotid 1-39% stenosis.  Heterogenous plaque seen on Left proximal Internal carotid artery. Calcified plaque on Rt Common Carotid artery distal to proximal Internal Carotid artery bifurcation.    Carmen Duncan(RDMS, RVT)

## 2017-07-25 NOTE — Evaluation (Signed)
Speech Language Pathology Evaluation Patient Details Name: Carmen Duncan MRN: [id number] DOB: 09/19/1940 Today's Date: 07/25/2017 Time: 1425-1440 SLP Time Calculation (min) (ACUTE ONLY): 15 min  Problem List:  Patient Active Problem List   Diagnosis Date Noted  . Weak 07/24/2017  . Vascular dementia without behavioral disturbance 03/14/2016  . Asthma   . CVA (cerebral infarction) 11/17/2015  . Left hemiparesis ([place])   . Acute CVA (cerebrovascular accident) ([place]) 11/15/2015  . CVA (cerebral vascular accident) ([place]) 11/14/2015  . Left-sided muscle weakness 11/14/2015  . Left-sided weakness 11/14/2015  . Breast cancer of upper-outer quadrant of right female breast ([place]) 07/19/2015  . B12 deficiency 03/14/2015  . Bilateral carotid artery disease ([place]) 10/07/2014  . Pulmonary hypertension ([place]) 10/07/2014  . Ejection fraction   . Cryptogenic stroke ([place]) 10/04/2014  . Oral candidiasis 04/28/2014  . Chronic diastolic congestive heart failure ([place]) 04/28/2014  . IPF (idiopathic pulmonary fibrosis) ([place]) 04/21/2014  . Hyponatremia 04/19/2014  . Thrombocytopenia ([place]) 04/19/2014  . Malnutrition of moderate degree ([place]) 04/19/2014  . Hypertension 04/18/2014  . Hyperlipidemia 04/18/2014  . Hypovolemia due to dehydration 04/18/2014  . Dyspnea on exertion 04/04/2014  . Abnormal nuclear stress test 04/04/2014  . HOH (hard of hearing)   . Raynaud's phenomenon 03/21/2014  . Tremor, essential 03/12/2013  . Unspecified hereditary and idiopathic peripheral neuropathy 08/04/2012  . Squamous cell skin cancer 08/19/2011  . 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 Dyslipidemia 04/03/2010  . Essential hypertension 04/03/2010  . GERD 04/03/2010  . IBS 04/03/2010  . DEEP VENOUS THROMBOPHLEBITIS, HX OF 04/03/2010  . DIVERTICULITIS, HX OF 04/03/2010   Past Medical History:  Past Medical History:  Diagnosis Date  . Acoustic neuroma ([place])   . Anxiety   . Arthritis   . Asthma   . Brain cancer ([place])   .  Breast cancer of upper-outer quadrant of right female breast ([place]) 07/19/2015  . Diverticulosis   . Ejection fraction   . Essential tremor    on inderal  . GERD (gastroesophageal reflux disease)   . HOH (hard of hearing) left ear  . Hyperlipidemia   . Hypertension   . IBS (irritable bowel syndrome)   . Interstitial lung disease ([place])   . Pneumonia    04-2014, CAP  . Stroke [facility])    no deficits, on plavix   Past Surgical History:  Past Surgical History:  Procedure Laterality Date  . ABDOMINAL HYSTERECTOMY  1980  . BIOPSY BREAST    . BREAST LUMPECTOMY WITH RADIOACTIVE SEED AND SENTINEL LYMPH NODE BIOPSY Right 08/10/2015   Procedure: BREAST LUMPECTOMY WITH RADIOACTIVE SEED AND SENTINEL LYMPH NODE BIOPSY;  Surgeon: Autumn Messing III, MD;  Location: [place];  Service: General;  Laterality: Right;  . CHOLECYSTECTOMY  2011  . EP IMPLANTABLE DEVICE N/A 11/16/2015   Procedure: Loop Recorder Insertion;  Surgeon: [name], MD;  Location: [place] CV LAB;  Service: Cardiovascular;  Laterality: N/A;  . eyes  2007   cararacts  . TEE WITHOUT CARDIOVERSION N/A 09/05/2014   Procedure: TRANSESOPHAGEAL ECHOCARDIOGRAM (TEE);  Surgeon: Lelon Perla, MD;  Location: [facility] ENDOSCOPY;  Service: Cardiovascular;  Laterality: N/A;  . TEE WITHOUT CARDIOVERSION N/A 11/16/2015   Procedure: TRANSESOPHAGEAL ECHOCARDIOGRAM (TEE);  Surgeon: Larey Dresser, MD;  Location: [facility] ENDOSCOPY;  Service: Cardiovascular;  Laterality: N/A;  . TONSILLECTOMY  [id number]   HPI:  77 year old female admitted 07/24/17 with weakness, nausea/vomiting. PMH significant for acoustic neuroma, asthma, brain CA, breast CA, HTN, interstitial lung  disease, CVA, GERD, PNA (2015), IBS, dementia.   Assessment / Plan / Recommendation Clinical Impression  The Mini-Mental State Exam (MMSE) was administered. Pt scored 27/30 (n=26+/30), indicating performance within functional limits for her age and education. However, given documented  vascular dementia at baseline, supervision at home is recommended, with assistance managing bills (pt reports she continues to do this independently). Recommend consideration of home health or outpatient speech therapy if difficulties arise once pt returns to normal/daily routines.    SLP Assessment  SLP Recommendation/Assessment: All further Speech Language Pathology  needs can be addressed in the next venue of care if needs arise.  SLP Visit Diagnosis: Cognitive communication deficit (R41.841)    Follow Up Recommendations  (HH/OP ST if needs arise)          SLP Evaluation Cognition  Overall Cognitive Status: History of cognitive impairments - at baseline Arousal/Alertness: Awake/alert Orientation Level: Oriented X4 Attention: Focused;Sustained Focused Attention: Appears intact Sustained Attention: Appears intact       Comprehension  Auditory Comprehension Overall Auditory Comprehension: Appears within functional limits for tasks assessed    Expression Expression Primary Mode of Expression: Verbal Verbal Expression Overall Verbal Expression: Appears within functional limits for tasks assessed   Oral / Motor  Oral Motor/Sensory Function Overall Oral Motor/Sensory Function: Within functional limits Motor Speech Overall Motor Speech: Appears within functional limits for tasks assessed   GO                   Celia B. Quentin Ore [facility], CCC-SLP Speech Language Pathologist [phone]  Shonna Chock 07/25/2017, 2:48 PM

## 2017-07-25 NOTE — Care Management Obs Status (Signed)
[place] NOTIFICATION   Patient Details  Name: DANECIA UNDERDOWN MRN: [id number] Date of Birth: 1941/05/13   Medicare Observation Status Notification Given:  Yes    Pollie Friar, RN 07/25/2017, 3:25 PM

## 2017-07-25 NOTE — Progress Notes (Signed)
Attempted echo at 12:00 pm. Patient in chair and eating. Asked me to come back.

## 2017-07-25 NOTE — Progress Notes (Signed)
OT Cancellation Note  Patient Details Name: Carmen Duncan MRN: [id number] DOB: [birth year]   Cancelled Treatment:    Reason Eval/Treat Not Completed: Other (comment). Pt eating lunch and Echo awaiting. Will try back and next available time/day  Britt Bottom 07/25/2017, 12:41 PM

## 2017-07-25 NOTE — Consult Note (Signed)
Referring Provider:  Dr. Starla Link Primary Care Physician:  [name], MD Primary Gastroenterologist:  Dr. Oletta Lamas  Reason for Consultation: Abnormal CT of colon  HPI: Carmen Duncan is a 77 y.o. female admitted to the hospital yesterday with weakness and concerns of a stroke, although radiographically there has been no evidence of that.    She also has had some recent vomiting, prompting a CT of the abdomen which showed a possible soft tissue mass in the region just distal to the hepatic flexure.  I have reviewed that CT with the radiologist in person, and it is an equivocal finding, best seen on axial views.    The patient's last colonoscopy was on March 19, 2007, by Dr. Earlie Raveling, which was negative except for minimal diverticulosis.    The patient has long-standing IBS but does not report any recent significant change in bowel habits.  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  I did speak with her son, Carmen Duncan, who is a Software engineer  and he indicates that the patient's functional status has declined significantly recently.  She is barely ambulatory in recent weeks and even prior to that was semi-ambulatory.  She has not driven a car for a couple of years.   Past Medical History:  Diagnosis Date  . Acoustic neuroma ([place])   . Anxiety   . Arthritis   . Asthma   . Brain cancer ([place])   . Breast cancer of upper-outer quadrant of right female breast ([place]) 07/19/2015  . Diverticulosis   . Ejection fraction   . Essential tremor    on inderal  . GERD (gastroesophageal reflux disease)   . HOH (hard of hearing) left ear  . Hyperlipidemia   . Hypertension   . IBS (irritable bowel syndrome)   . Interstitial lung disease ([place])   . Pneumonia    04-2014, CAP  . Stroke [facility])    no deficits, on plavix    Past Surgical History:  Procedure Laterality Date  . ABDOMINAL HYSTERECTOMY  1980  . BIOPSY BREAST    . BREAST LUMPECTOMY WITH RADIOACTIVE SEED  AND SENTINEL LYMPH NODE BIOPSY Right 08/10/2015   Procedure: BREAST LUMPECTOMY WITH RADIOACTIVE SEED AND SENTINEL LYMPH NODE BIOPSY;  Surgeon: Autumn Messing III, MD;  Location: [facility];  Service: General;  Laterality: Right;  . CHOLECYSTECTOMY  2011  . EP IMPLANTABLE DEVICE N/A 11/16/2015   Procedure: Loop Recorder Insertion;  Surgeon: [name], MD;  Location: [facility] CV LAB;  Service: Cardiovascular;  Laterality: N/A;  . eyes  2007   cararacts  . TEE WITHOUT CARDIOVERSION N/A 09/05/2014   Procedure: TRANSESOPHAGEAL ECHOCARDIOGRAM (TEE);  Surgeon: Lelon Perla, MD;  Location: [facility] ENDOSCOPY;  Service: Cardiovascular;  Laterality: N/A;  . TEE WITHOUT CARDIOVERSION N/A 11/16/2015   Procedure: TRANSESOPHAGEAL ECHOCARDIOGRAM (TEE);  Surgeon: Larey Dresser, MD;  Location: [facility];  Service: Cardiovascular;  Laterality: N/A;  . TONSILLECTOMY  1945    Prior to Admission medications   Medication Sig Start Date End Date Taking? Authorizing Provider  acetaminophen (TYLENOL) 325 MG tablet Take 325 mg by mouth every 6 (six) hours as needed for headache.    Yes [provider]  amLODipine (NORVASC) 2.5 MG tablet TAKE 1 TABLET BY MOUTH ONCE DAILY Patient taking differently: 2.5mg  by mouth once daily 06/02/17  Yes Burchette, Alinda Sierras, MD  atorvastatin (LIPITOR) 20 MG tablet Take 1 tablet (20 mg total) by mouth daily. 11/27/15  Yes Angiulli, Lavon Paganini, PA-C  clopidogrel (PLAVIX) 75 MG tablet TAKE ONE TABLET BY MOUTH ONCE DAILY Patient taking differently: 75mg  by mouth once daily 12/11/16  Yes Tat, Rebecca S, DO  donepezil (ARICEPT) 5 MG tablet Take 1 tablet (5 mg total) by mouth at bedtime. Patient taking differently: Take 10 mg by mouth daily.  01/29/17  Yes Tat, Eustace Quail, DO  escitalopram (LEXAPRO) 10 MG tablet TAKE 1 TABLET BY MOUTH ONCE DAILY. Patient taking differently: 10mg  by mouth once daily 03/10/17  Yes Tat, Rebecca S, DO  Nintedanib ([name]) 100 MG CAPS Take 100 mg by  mouth 2 (two) times daily.   Yes [provider]  omeprazole (PRILOSEC) 20 MG capsule Take 1 capsule (20 mg total) by mouth daily. 10/15/16  Yes Burchette, Alinda Sierras, MD  ondansetron (ZOFRAN) 4 MG tablet Take 1 tablet (4 mg total) by mouth every 8 (eight) hours as needed for nausea or vomiting. 03/29/16  Yes Rigoberto Noel, MD  propranolol (INDERAL) 20 MG tablet TAKE 1 TABLET BY MOUTH ONCE DAILY Patient taking differently: 20mg  by mouth once daily 06/02/17  Yes Tat, Wells Guiles S, DO  atorvastatin (LIPITOR) 20 MG tablet TAKE 1 TABLET BY MOUTH ONCE DAILY Patient not taking: Reported on 07/24/2017 06/02/17   [name], MD    Current Facility-Administered Medications  Medication Dose Route Frequency Provider Last Rate Last Dose  .  stroke: mapping our early stages of recovery book   Does not apply Once Elwin Mocha, MD      . 0.9 % NaCl with KCl 40 mEq / L  infusion   Intravenous Continuous Aline August, MD 75 mL/hr at 07/25/17 0950 75 mL/hr at 07/25/17 0950  . acetaminophen (TYLENOL) tablet 650 mg  650 mg Oral Q4H PRN Elwin Mocha, MD       Or  . acetaminophen (TYLENOL) solution 650 mg  650 mg Per Tube Q4H PRN Elwin Mocha, MD       Or  . acetaminophen (TYLENOL) suppository 650 mg  650 mg Rectal Q4H PRN Elwin Mocha, MD      . albuterol (PROVENTIL) (2.5 MG/3ML) 0.083% nebulizer solution 2.5 mg  2.5 mg Nebulization Q2H PRN Elwin Mocha, MD      . amLODipine (NORVASC) tablet 2.5 mg  2.5 mg Oral Daily Elwin Mocha, MD   2.5 mg at 07/25/17 9323  . aspirin chewable tablet 324 mg  324 mg Oral Once Elwin Mocha, MD      . atorvastatin (LIPITOR) tablet 20 mg  20 mg Oral Daily Elwin Mocha, MD   20 mg at 07/25/17 5573  . clopidogrel (PLAVIX) tablet 75 mg  75 mg Oral Daily Elwin Mocha, MD   75 mg at 07/25/17 2202  . donepezil (ARICEPT) tablet 10 mg  10 mg Oral Daily Elwin Mocha, MD   10 mg at 07/25/17 5427  . escitalopram (LEXAPRO) tablet 10 mg  10 mg Oral  Daily Elwin Mocha, MD   10 mg at 07/25/17 0623  . Nintedanib ([name]) CAPS 100 mg  100 mg Oral BID Elwin Mocha, MD   100 mg at 07/25/17 7628  . pantoprazole (PROTONIX) EC tablet 40 mg  40 mg Oral Daily Elwin Mocha, MD   40 mg at 07/25/17 3151  . potassium chloride SA (K-DUR,KLOR-CON) CR tablet 40 mEq  40 mEq Oral Once Ocie Cornfield T, PA-C      . potassium chloride SA (  K-DUR,KLOR-CON) CR tablet 40 mEq  40 mEq Oral Q4H Aline August, MD   40 mEq at 07/25/17 0926  . propranolol (INDERAL) tablet 20 mg  20 mg Oral Daily Elwin Mocha, MD   20 mg at 07/25/17 8938  . senna-docusate (Senokot-S) tablet 1 tablet  1 tablet Oral QHS PRN Elwin Mocha, MD        Allergies as of 07/24/2017 - Review Complete 07/24/2017  Allergen Reaction Noted  . Latex Itching, Rash, and Other (See Comments) 10/02/2010  . Sulfonamide derivatives Rash 04/03/2010  . Tape Itching and Rash 11/14/2015    Family History  Problem Relation Age of Onset  . Hyperlipidemia Mother   . Hypertension Mother   . Hyperlipidemia Father   . Heart disease Father 54  . Stroke Father   . Lung cancer Paternal Uncle        x 4    Social History   Socioeconomic History  . Marital status: Widowed    Spouse name: Not on file  . Number of children: Not on file  . Years of education: Not on file  . Highest education level: Not on file  Social Needs  . Financial resource strain: Not on file  . Food insecurity - worry: Not on file  . Food insecurity - inability: Not on file  . Transportation needs - medical: Not on file  . Transportation needs - non-medical: Not on file  Occupational History  . Not on file  Tobacco Use  . Smoking status: Never Smoker  . Smokeless tobacco: Never Used  Substance and Sexual Activity  . Alcohol use: Yes    Alcohol/week: 0.0 oz    Comment: wine about 2 times a week   . Drug use: No  . Sexual activity: Yes    Partners: Female  Other Topics Concern  . Not on file  Social  History Narrative  . Not on file    Review of Systems: At this time, the patient states she does not feel well, in sort of a global sense, no localizing complaints voiced  Physical Exam: Vital signs in last 24 hours: Temp:  [97.6 F (36.4 C)-98.3 F (36.8 C)] 97.6 F (36.4 C) (03/01 1117) Pulse Rate:  [58-74] 58 (03/01 1117) Resp:  [15-19] 16 (03/01 1117) BP: (139-166)/(61-73) 139/69 (03/01 1117) SpO2:  [95 %-98 %] 97 % (03/01 1117)   This is a very pleasant Caucasian female sitting up in the bedside recliner, in no distress whatsoever.  She is anicteric and without pallor.  She is awake, alert, and seemingly quite coherent, for example, able to give me her son's telephone number correctly, although she was 1 year off on her age, and provided incorrect information about her driving ability, although perhaps she missed the question because of hearing impairment.  The chest is clear, the heart is without significant murmur or arrhythmia, and the abdomen is without overt mass-effect or tenderness.  Intake/Output from previous day: 02/28 0701 - 03/01 0700 In: 240 [P.O.:240] Out: 50 [Urine:50] Intake/Output this shift: No intake/output data recorded.  Lab Results: Recent Labs    07/24/17 1221 07/24/17 1254  WBC 11.5*  --   HGB 14.8 16.0*  HCT 44.7 47.0*  PLT 257  --    BMET Recent Labs    07/24/17 1221 07/24/17 1254 07/25/17 0644  NA 136 139 137  K 2.8* 2.8* 2.6*  CL 96* 95* 99*  CO2 28  --  27  GLUCOSE 102* 101* 94  BUN 6 6 6   CREATININE 0.73 0.70 0.68  CALCIUM 9.1  --  8.6*   LFT Recent Labs    07/24/17 1221  PROT 8.0  ALBUMIN 3.4*  AST 26  ALT 13*  ALKPHOS 65  BILITOT 0.9   PT/INR Recent Labs    07/24/17 1221  LABPROT 13.1  INR 1.00    Studies/Results: Dg Chest 2 View  Result Date: 07/24/2017 CLINICAL DATA:  Nausea for months, weakness, leaning to LEFT, difficulty ambulating up stairs to bed, old stroke, RIGHT breast cancer, hypertension,  interstitial lung disease EXAM: CHEST  2 VIEW COMPARISON:  11/14/2015 FINDINGS: Enlargement of cardiac silhouette. Loop recorder projects over lower anterior LEFT chest. Mediastinal contours and pulmonary vascularity normal. Emphysematous and peripheral chronic interstitial lung disease changes of the mid to lower lungs bilaterally. Superimposed bibasilar atelectasis. No definite acute infiltrate, pleural effusion or pneumothorax. Diffuse osseous demineralization. IMPRESSION: Changes of COPD and chronic interstitial lung disease with bibasilar atelectasis. Enlargement of cardiac silhouette. Electronically Signed   By: Lavonia Dana M.D.   On: 07/24/2017 15:29   Ct Head Wo Contrast  Result Date: 07/24/2017 CLINICAL DATA:  Headache, vomiting, dizziness, history of breast carcinoma EXAM: CT HEAD WITHOUT CONTRAST TECHNIQUE: Contiguous axial images were obtained from the base of the skull through the vertex without intravenous contrast. COMPARISON:  CT brain scan of 12/11/2015 FINDINGS: Brain: The ventricular system is prominent as are the cortical sulci, consistent with diffuse atrophy. Moderately severe small vessel ischemic changes again noted throughout the periventricular white matter with very little interval change. The septum is midline in position. No hemorrhage, mass lesion, or acute infarction is seen. Vascular: No vascular abnormality is noted on this unenhanced study. Skull: On bone window images, no calvarial abnormality is seen. Sinuses/Orbits: The paranasal sinuses are well pneumatized. Other: None. IMPRESSION: 1. No acute intracranial abnormality. 2. No significant interval change in the degree of atrophy and moderately severe small vessel ischemic change within the periventricular white matter. Electronically Signed   By: Ivar Drape M.D.   On: 07/24/2017 13:05   Mr Brain Wo Contrast  Result Date: 07/24/2017 CLINICAL DATA:  Generalized weakness, nausea and vomiting. History of breast cancer,  acoustic neuroma and stroke. EXAM: MRI HEAD WITHOUT CONTRAST TECHNIQUE: Multiplanar, multiecho pulse sequences of the brain and surrounding structures were obtained without intravenous contrast. COMPARISON:  CT HEAD July 24, 2017 and MRI of the head November 13, 2016 FINDINGS: INTRACRANIAL CONTENTS: No reduced diffusion to suggest acute ischemia. New versus more conspicuous scattered chronic micro hemorrhages (potentially technical). Stable moderate parenchymal brain volume loss. No hydrocephalus. Old small LEFT occipital lobe infarct. Confluent supratentorial patchy pontine white matter FLAIR T2 hyperintensities worse than prior imaging. Suspicious parenchymal signal, masses, mass effect. No abnormal extra-axial fluid collections. 5 mm broad-based LEFT parietal meningioma versus hyperostosis internus. No mass effect. VASCULAR: Normal major intracranial vascular flow voids present at skull base. SKULL AND UPPER CERVICAL SPINE: No abnormal sellar expansion. No suspicious calvarial bone marrow signal. Craniocervical junction maintained. SINUSES/ORBITS: The mastoid air-cells and included paranasal sinuses are well-aerated.The included ocular globes and orbital contents are non-suspicious. Status post bilateral ocular lens implants. OTHER: None. IMPRESSION: 1. No acute intracranial process. 2. Stable moderate parenchymal brain volume loss. 3. Progressed moderate to severe chronic small vessel ischemic disease. 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 Electronically Signed   By: Elon Alas M.D.   On: 07/24/2017 18:30   Ct Abdomen Pelvis W Contrast  Result Date: 07/24/2017 CLINICAL DATA:  Nausea  several months.  History of breast cancer per EXAM: CT ABDOMEN AND PELVIS WITH CONTRAST TECHNIQUE: Multidetector CT imaging of the abdomen and pelvis was performed using the standard protocol following bolus administration of intravenous contrast. CONTRAST:  75 mL ISOVUE-300 IOPAMIDOL (ISOVUE-300) INJECTION 61%  COMPARISON:  01/06/2015 FINDINGS: Lower chest: Evidence of bibasilar fibrosis and bronchiectatic change. No focal airspace process or effusion. Hepatobiliary: Previous cholecystectomy. Several liver hypodensities with the largest over the dome measuring 3.8 cm likely cysts. Biliary tree is within normal. Pancreas: Normal. Spleen: Normal. Adrenals/Urinary Tract: Adrenal glands are normal. Kidneys are normal in size without hydronephrosis. Subcentimeter hypodensity over the lower pole cortex of the right kidney too small to characterize but likely cysts. Ureters and bladder are within normal. Stomach/Bowel: Stomach and small bowel are normal. Appendix is normal. Mild focal eccentric wall thickening in the region of the hepatic flexure. Vascular/Lymphatic: Moderate calcified plaque over the abdominal aorta. No significant adenopathy. Reproductive: Previous hysterectomy. Other: No significant free fluid or focal inflammatory change. Surgical clip over the left pelvis. Musculoskeletal: Moderate degenerative changes spinal multilevel disc disease over the lumbar spine. Mild degenerate change of the hips. IMPRESSION: No acute findings in the abdomen/pelvis. Suggestion of focal eccentric wall thickening over the hepatic flexure of the colon as a mass/neoplasm is possible. No significant adjacent adenopathy. Consider endoscopic correlation. Several liver cysts with the largest over the dome measuring 3.8 cm. Subcentimeter right renal cortical hypodensity too small to characterize but likely a cyst. Aortic Atherosclerosis (ICD10-I70.0). Bibasilar fibrosis and bronchiectatic change. Electronically Signed   By: Marin Olp M.D.   On: 07/24/2017 16:03    Impression: Radiographic abnormality of the colon on CT scan, incidental finding, uncertain significance.  It is certainly conceivable that the patient has developed a significant polyp or even an early colon cancer in the 10-1/2 years since her last colonoscopy, although  the absence of anemia or lower tract symptoms is somewhat reassuring in this regard, especially since the radiographic abnormality is equivocal.  Plan: I spoke for about 16 minutes on the telephone with the patient's son, Carmen Duncan.  We discussed various options, such as proceeding directly to colonoscopy which would be definitive for excluding a mass lesion in the colon, but would expose the patient to the difficulty of undergoing a prep, and intraprocedural risk as well.    Therefore, we have decided to take a more conservative approach.    I will  request a stool Hemoccult if she has any bowel movements while in the hospital, and have arranged a follow-up appointment with her primary gastroenterologist, Dr. Laurence Spates, for April 15 at 1 PM.  By that time, it should be more clear whether it is appropriate to pursue workup for a colonic neoplasm.  For example, we could measure an updated hemoglobin level and do serial Hemoccult testing, and we would also have a better idea as to whether or not the patient is strong enough, as time goes on, to undergo colonoscopy.    One option, short of putting this elderly, frail patient through colonoscopy, would be simply to repeat her CT scan and see if the lesion is persistent or perhaps even more prominent over time.  We will sign off.  Do not hesitate to call us, however, if you have questions pertaining to this patient's case.   LOS: 0 days   Windel Keziah V  07/25/2017, 12:44 PM   Pager [phone] after 5 PM call [phone]

## 2017-07-25 NOTE — Progress Notes (Signed)
Patient ID: Carmen Duncan, female   DOB: 01-15-1941, 77 y.o.   MRN: [id number]  PROGRESS NOTE    Carmen Duncan  WIO:[id number] DOB: 31-Jul-1940 DOA: 07/24/2017 PCP: [name], MD   Brief Narrative:  77 year old female with history of acoustic neuroma, asthma, breast cancer, pulmonary hypertension, hypertension, history of CVA, chronic diastolic heart failure, idiopathic pulmonary fibrosis, IBS, GERD, essential tremor and dementia presented with vomiting, diarrhea and progressively worsening gradual weakness.  She was admitted with weakness to rule out stroke.  CT abdomen on admission showed hepatic flexure thickening.   Assessment & Plan:   Active Problems:   Weak   Progressive weakness  -CT head negative for acute bleed -MRI brain showed no acute stroke -Discontinue full dose aspirin -Follow echo and carotid ultrasound -PT/OT/SLP evaluation -Fall precautions -UA negative. -Check vitamin B12, folate, TSH  Nausea, vomiting and diarrhea -Questionable cause.  Improving.  No vomiting since admission.  Advance diet as tolerated  Dehydration -Continue IV fluids  Hepatic flexure thickening -Cannot rule out malignancy.  Spoke to the son.  Will involve gastroenterology for their opinion.  Patient is unsure if the patient will be able to tolerate the prep for colonoscopy  Hypokalemia -Replace.  Repeat a.m. labs  Asthma/idiopathic pulmonary fibrosis -Prn albuterol -Continue ofev  Hypertension -Blood pressure stable.  Continue Norvasc and Inderal  History of stroke -Continue plavix  Hyperlipidemia -Continue statin  Dementia -Continue Aricept, lexapro  GERD -Continue PPI  Code Status:  Full DVT Prophylaxis: heparin Family Communication: son at bedside Disposition Plan:  Home in 1-2 days pending clinical improvement   Consultants: GI  Procedures: None  Antimicrobials: None    Subjective: Patient seen and examined at bedside.  She is awake and feels  weak with no appetite.  No overnight fever, nausea or vomiting.  Objective: Vitals:   07/24/17 2240 07/25/17 0041 07/25/17 0415 07/25/17 0749  BP:   (!) 150/61 (!) 150/73  Pulse: 61 65 74 72  Resp: 18 18 18 16   Temp: 98.3 F (36.8 C) 98.1 F (36.7 C) 98 F (36.7 C)   TempSrc: Oral Oral Oral   SpO2: 98% 96% 97%     Intake/Output Summary (Last 24 hours) at 07/25/2017 0902 Last data filed at 07/24/2017 2155 Gross per 24 hour  Intake 240 ml  Output 50 ml  Net 190 ml   There were no vitals filed for this visit.  Examination:  General exam: Appears calm and comfortable, very thinly built elderly female Respiratory system: Bilateral decreased breath sound at bases Cardiovascular system: S1 & S2 heard, rate controlled  gastrointestinal system: Abdomen is nondistended, soft and nontender. Normal bowel sounds heard. Central nervous system: Awake. No focal neurological deficits. Moving extremities Extremities: No cyanosis, clubbing, edema  Skin: No rashes, lesions or ulcers Lymph: No cervical lymphadenopathy    Data Reviewed: I have personally reviewed following labs and imaging studies  CBC: Recent Labs  Lab 07/24/17 1221 07/24/17 1254  WBC 11.5*  --   NEUTROABS 8.6*  --   HGB 14.8 16.0*  HCT 44.7 47.0*  MCV 88.5  --   PLT 257  --    Basic Metabolic Panel: Recent Labs  Lab 07/24/17 1221 07/24/17 1254 07/25/17 0644  NA 136 139 137  K 2.8* 2.8* 2.6*  CL 96* 95* 99*  CO2 28  --  27  GLUCOSE 102* 101* 94  BUN 6 6 6   CREATININE 0.73 0.70 0.68  CALCIUM 9.1  --  8.6*  GFR: CrCl cannot be calculated (Unknown ideal weight.). Liver Function Tests: Recent Labs  Lab 07/24/17 1221  AST 26  ALT 13*  ALKPHOS 65  BILITOT 0.9  PROT 8.0  ALBUMIN 3.4*   No results for input(s): LIPASE, AMYLASE in the last 168 hours. No results for input(s): AMMONIA in the last 168 hours. Coagulation Profile: Recent Labs  Lab 07/24/17 1221  INR 1.00   Cardiac Enzymes: No  results for input(s): CKTOTAL, CKMB, CKMBINDEX, TROPONINI in the last 168 hours. BNP (last 3 results) No results for input(s): PROBNP in the last 8760 hours. HbA1C: Recent Labs    07/25/17 0644  HGBA1C 5.4   CBG: No results for input(s): GLUCAP in the last 168 hours. Lipid Profile: Recent Labs    07/25/17 0644  CHOL 142  HDL 32*  LDLCALC 86  TRIG 120  CHOLHDL 4.4   Thyroid Function Tests: No results for input(s): TSH, T4TOTAL, FREET4, T3FREE, THYROIDAB in the last 72 hours. Anemia Panel: No results for input(s): VITAMINB12, FOLATE, FERRITIN, TIBC, IRON, RETICCTPCT in the last 72 hours. Sepsis Labs: No results for input(s): PROCALCITON, LATICACIDVEN in the last 168 hours.  No results found for this or any previous visit (from the past 240 hour(s)).       Radiology Studies: Dg Chest 2 View  Result Date: 07/24/2017 CLINICAL DATA:  Nausea for months, weakness, leaning to LEFT, difficulty ambulating up stairs to bed, old stroke, RIGHT breast cancer, hypertension, interstitial lung disease EXAM: CHEST  2 VIEW COMPARISON:  11/14/2015 FINDINGS: Enlargement of cardiac silhouette. Loop recorder projects over lower anterior LEFT chest. Mediastinal contours and pulmonary vascularity normal. Emphysematous and peripheral chronic interstitial lung disease changes of the mid to lower lungs bilaterally. Superimposed bibasilar atelectasis. No definite acute infiltrate, pleural effusion or pneumothorax. Diffuse osseous demineralization. IMPRESSION: Changes of COPD and chronic interstitial lung disease with bibasilar atelectasis. Enlargement of cardiac silhouette. Electronically Signed   By: Lavonia Dana M.D.   On: 07/24/2017 15:29   Ct Head Wo Contrast  Result Date: 07/24/2017 CLINICAL DATA:  Headache, vomiting, dizziness, history of breast carcinoma EXAM: CT HEAD WITHOUT CONTRAST TECHNIQUE: Contiguous axial images were obtained from the base of the skull through the vertex without intravenous  contrast. COMPARISON:  CT brain scan of 12/11/2015 FINDINGS: Brain: The ventricular system is prominent as are the cortical sulci, consistent with diffuse atrophy. Moderately severe small vessel ischemic changes again noted throughout the periventricular white matter with very little interval change. The septum is midline in position. No hemorrhage, mass lesion, or acute infarction is seen. Vascular: No vascular abnormality is noted on this unenhanced study. Skull: On bone window images, no calvarial abnormality is seen. Sinuses/Orbits: The paranasal sinuses are well pneumatized. Other: None. IMPRESSION: 1. No acute intracranial abnormality. 2. No significant interval change in the degree of atrophy and moderately severe small vessel ischemic change within the periventricular white matter. Electronically Signed   By: Ivar Drape M.D.   On: 07/24/2017 13:05   Mr Brain Wo Contrast  Result Date: 07/24/2017 CLINICAL DATA:  Generalized weakness, nausea and vomiting. History of breast cancer, acoustic neuroma and stroke. EXAM: MRI HEAD WITHOUT CONTRAST TECHNIQUE: Multiplanar, multiecho pulse sequences of the brain and surrounding structures were obtained without intravenous contrast. COMPARISON:  CT HEAD July 24, 2017 and MRI of the head November 13, 2016 FINDINGS: INTRACRANIAL CONTENTS: No reduced diffusion to suggest acute ischemia. New versus more conspicuous scattered chronic micro hemorrhages (potentially technical). Stable moderate parenchymal brain volume loss. No  hydrocephalus. Old small LEFT occipital lobe infarct. Confluent supratentorial patchy pontine white matter FLAIR T2 hyperintensities worse than prior imaging. Suspicious parenchymal signal, masses, mass effect. No abnormal extra-axial fluid collections. 5 mm broad-based LEFT parietal meningioma versus hyperostosis internus. No mass effect. VASCULAR: Normal major intracranial vascular flow voids present at skull base. SKULL AND UPPER CERVICAL SPINE: No  abnormal sellar expansion. No suspicious calvarial bone marrow signal. Craniocervical junction maintained. SINUSES/ORBITS: The mastoid air-cells and included paranasal sinuses are well-aerated.The included ocular globes and orbital contents are non-suspicious. Status post bilateral ocular lens implants. OTHER: None. IMPRESSION: 1. No acute intracranial process. 2. Stable moderate parenchymal brain volume loss. 3. Progressed moderate to severe chronic small vessel ischemic disease. 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 Electronically Signed   By: Elon Alas M.D.   On: 07/24/2017 18:30   Ct Abdomen Pelvis W Contrast  Result Date: 07/24/2017 CLINICAL DATA:  Nausea several months.  History of breast cancer per EXAM: CT ABDOMEN AND PELVIS WITH CONTRAST TECHNIQUE: Multidetector CT imaging of the abdomen and pelvis was performed using the standard protocol following bolus administration of intravenous contrast. CONTRAST:  75 mL ISOVUE-300 IOPAMIDOL (ISOVUE-300) INJECTION 61% COMPARISON:  01/06/2015 FINDINGS: Lower chest: Evidence of bibasilar fibrosis and bronchiectatic change. No focal airspace process or effusion. Hepatobiliary: Previous cholecystectomy. Several liver hypodensities with the largest over the dome measuring 3.8 cm likely cysts. Biliary tree is within normal. Pancreas: Normal. Spleen: Normal. Adrenals/Urinary Tract: Adrenal glands are normal. Kidneys are normal in size without hydronephrosis. Subcentimeter hypodensity over the lower pole cortex of the right kidney too small to characterize but likely cysts. Ureters and bladder are within normal. Stomach/Bowel: Stomach and small bowel are normal. Appendix is normal. Mild focal eccentric wall thickening in the region of the hepatic flexure. Vascular/Lymphatic: Moderate calcified plaque over the abdominal aorta. No significant adenopathy. Reproductive: Previous hysterectomy. Other: No significant free fluid or focal inflammatory change.  Surgical clip over the left pelvis. Musculoskeletal: Moderate degenerative changes spinal multilevel disc disease over the lumbar spine. Mild degenerate change of the hips. IMPRESSION: No acute findings in the abdomen/pelvis. Suggestion of focal eccentric wall thickening over the hepatic flexure of the colon as a mass/neoplasm is possible. No significant adjacent adenopathy. Consider endoscopic correlation. Several liver cysts with the largest over the dome measuring 3.8 cm. Subcentimeter right renal cortical hypodensity too small to characterize but likely a cyst. Aortic Atherosclerosis (ICD10-I70.0). Bibasilar fibrosis and bronchiectatic change. Electronically Signed   By: Marin Olp M.D.   On: 07/24/2017 16:03        Scheduled Meds: .  stroke: mapping our early stages of recovery book   Does not apply Once  . amLODipine  2.5 mg Oral Daily  . aspirin  324 mg Oral Once  . aspirin  325 mg Oral Daily  . atorvastatin  20 mg Oral Daily  . clopidogrel  75 mg Oral Daily  . donepezil  10 mg Oral Daily  . escitalopram  10 mg Oral Daily  . Nintedanib  100 mg Oral BID  . pantoprazole  40 mg Oral Daily  . potassium chloride  40 mEq Oral Once  . potassium chloride  40 mEq Oral Q4H  . propranolol  20 mg Oral Daily   Continuous Infusions: . 0.9 % NaCl with KCl 40 mEq / L       LOS: 0 days        Aline August, MD [facility] Pager [phone]  If 7PM-7AM, please contact night-coverage [email or web address]  Password [email or web address] 07/25/2017, 9:02 AM

## 2017-07-25 NOTE — NC FL2 (Signed)
[place] LEVEL OF CARE SCREENING TOOL     IDENTIFICATION  Patient Name: Carmen Duncan Birthdate: 08-Aug-1940 Sex: female Admission Date (Current Location): 07/24/2017  [facility] and [place] Number:  Herbalist and Address:  [facility], [place] [address], [place], [place] [address]      Provider Number: [id number]  Attending Physician Name and Address:  Aline August, MD  Relative Name and Phone Number:       Current Level of Care: Hospital Recommended Level of Care: [place] Prior Approval Number:    Date Approved/Denied:   PASRR Number: [id number]  Discharge Plan: SNF    Current Diagnoses: Patient Active Problem List   Diagnosis Date Noted  . Weak 07/24/2017  . Vascular dementia without behavioral disturbance 03/14/2016  . Asthma   . CVA (cerebral infarction) 11/17/2015  . Left hemiparesis ([place])   . Acute CVA (cerebrovascular accident) ([place]) 11/15/2015  . CVA (cerebral vascular accident) ([place]) 11/14/2015  . Left-sided muscle weakness 11/14/2015  . Left-sided weakness 11/14/2015  . Breast cancer of upper-outer quadrant of right female breast ([place]) 07/19/2015  . B12 deficiency 03/14/2015  . Bilateral carotid artery disease ([place]) 10/07/2014  . Pulmonary hypertension ([place]) 10/07/2014  . Ejection fraction   . Cryptogenic stroke ([place]) 10/04/2014  . Oral candidiasis 04/28/2014  . Chronic diastolic congestive heart failure ([place]) 04/28/2014  . IPF (idiopathic pulmonary fibrosis) ([place]) 04/21/2014  . Hyponatremia 04/19/2014  . Thrombocytopenia ([place]) 04/19/2014  . Malnutrition of moderate degree ([place]) 04/19/2014  . Hypertension 04/18/2014  . Hyperlipidemia 04/18/2014  . Hypovolemia due to dehydration 04/18/2014  . Dyspnea on exertion 04/04/2014  . Abnormal nuclear stress test 04/04/2014  . HOH (hard of hearing)   . Raynaud's phenomenon 03/21/2014  . Tremor, essential 03/12/2013  . Unspecified hereditary and  idiopathic peripheral neuropathy 08/04/2012  . Squamous cell skin cancer 08/19/2011  . 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 Dyslipidemia 04/03/2010  . Essential hypertension 04/03/2010  . GERD 04/03/2010  . IBS 04/03/2010  . DEEP VENOUS THROMBOPHLEBITIS, HX OF 04/03/2010  . DIVERTICULITIS, HX OF 04/03/2010    Orientation RESPIRATION BLADDER Height & Weight     Self, Time, Situation, Place  Normal Incontinent, Continent(at times) Weight:   Height:     BEHAVIORAL SYMPTOMS/MOOD NEUROLOGICAL BOWEL NUTRITION STATUS      Continent Diet(heart healthy)  AMBULATORY STATUS COMMUNICATION OF NEEDS Skin   Limited Assist Verbally Normal                       Personal Care Assistance Level of Assistance  Bathing, Feeding, Dressing Bathing Assistance: Limited assistance Feeding assistance: Independent Dressing Assistance: Limited assistance     Functional Limitations Info  Sight, Hearing, Speech Sight Info: Adequate Hearing Info: Adequate Speech Info: Adequate    SPECIAL CARE FACTORS FREQUENCY  PT (By licensed PT), OT (By licensed OT)     PT Frequency: 5x/wk OT Frequency: 5x/wk            Contractures Contractures Info: Not present    Additional Factors Info  Code Status, Allergies, Psychotropic Code Status Info: Full Allergies Info: Latex, Sulfonamide Derivatives, Tape Psychotropic Info: Lexapro 10mg  daily         Current Medications (07/25/2017):  This is the current hospital active medication list Current Facility-Administered Medications  Medication Dose Route Frequency Provider Last Rate Last Dose  .  stroke: mapping our early stages of recovery book   Does not apply  Once Elwin Mocha, MD      . 0.9 % NaCl with KCl 40 mEq / L  infusion   Intravenous Continuous Aline August, MD 75 mL/hr at 07/25/17 0950 75 mL/hr at 07/25/17 0950  . acetaminophen (TYLENOL) tablet 650 mg  650 mg Oral Q4H PRN Elwin Mocha, MD       Or  . acetaminophen (TYLENOL) solution 650 mg   650 mg Per Tube Q4H PRN Elwin Mocha, MD       Or  . acetaminophen (TYLENOL) suppository 650 mg  650 mg Rectal Q4H PRN Elwin Mocha, MD      . albuterol (PROVENTIL) (2.5 MG/3ML) 0.083% nebulizer solution 2.5 mg  2.5 mg Nebulization Q2H PRN Elwin Mocha, MD      . amLODipine (NORVASC) tablet 2.5 mg  2.5 mg Oral Daily Elwin Mocha, MD   2.5 mg at 07/25/17 2800  . aspirin chewable tablet 324 mg  324 mg Oral Once Elwin Mocha, MD      . atorvastatin (LIPITOR) tablet 20 mg  20 mg Oral Daily Elwin Mocha, MD   20 mg at 07/25/17 3491  . clopidogrel (PLAVIX) tablet 75 mg  75 mg Oral Daily Elwin Mocha, MD   75 mg at 07/25/17 7915  . donepezil (ARICEPT) tablet 10 mg  10 mg Oral Daily Elwin Mocha, MD   10 mg at 07/25/17 0569  . escitalopram (LEXAPRO) tablet 10 mg  10 mg Oral Daily Elwin Mocha, MD   10 mg at 07/25/17 7948  . Nintedanib ([name]) CAPS 100 mg  100 mg Oral BID Elwin Mocha, MD   100 mg at 07/25/17 0165  . pantoprazole (PROTONIX) EC tablet 40 mg  40 mg Oral Daily Elwin Mocha, MD   40 mg at 07/25/17 5374  . potassium chloride SA (K-DUR,KLOR-CON) CR tablet 40 mEq  40 mEq Oral Once Ocie Cornfield T, PA-C      . propranolol (INDERAL) tablet 20 mg  20 mg Oral Daily Elwin Mocha, MD   20 mg at 07/25/17 8270  . senna-docusate (Senokot-S) tablet 1 tablet  1 tablet Oral QHS PRN Elwin Mocha, MD         Discharge Medications: Please see discharge summary for a list of discharge medications.  Relevant Imaging Results:  Relevant Lab Results:   Additional Information SS#: [id number]  Geralynn Ochs, LCSW

## 2017-07-25 NOTE — Evaluation (Signed)
Physical Therapy Evaluation Patient Details Name: Carmen Duncan MRN: [id number] DOB: April 15, 1941 Today's Date: 07/25/2017   History of Present Illness  77 y.o. female past medical history of acoustic neuroma, asthma, brain cancer, breast cancer, high blood pressure, interstitial lung disease and history of stroke presents emergency room with weakness.  Per patient's son who gives most of the history patient has been having gradual weakening over several weeks now.  She has a walker and cane but has been cruising along furniture.MRI revealed No acute intracranial process. As well as Progressed moderate to severe chronic small vessel ischemic disease. Old LEFT occipital lobe/PCA territory infarct.CT of the abdomen which showed a possible soft tissue mass in the region just distal to the hepatic flexure  Clinical Impression  PTA pt was having increasing difficulty with ambulation but was able to ascend/descend stairs to upstairs bedroom and ambulate in house relying on holding onto furniture for stability. Currently, pt is supervision for bed mobility, min guard for transfers and minA for ambulation of maximum 10 feet with increased fatigue. Pt limited in safe mobility by generalized weakness, L>R and fatigue. D/c recommendation at this time is for SNF level care to regain strength to be able to climb stairs in her home environment. PT will continue to follow pt acutely until d/c.      Follow Up Recommendations SNF    Equipment Recommendations  None recommended by PT       Precautions / Restrictions Precautions Precautions: Fall Precaution Comments: fell last week per son but pt does not remember Restrictions Weight Bearing Restrictions: No      Mobility  Bed Mobility Overal bed mobility: Needs Assistance Bed Mobility: Supine to Sit     Supine to sit: Supervision     General bed mobility comments: supervision for safety required additional time and effort  Transfers Overall transfer  level: Needs assistance Equipment used: Rolling walker (2 wheeled) Transfers: Sit to/from Stand Sit to Stand: Min guard         General transfer comment: min guard for safety, increased effort to power up to standing, vc for hand placement and hip scoot to EoB before power up  Ambulation/Gait Ambulation/Gait assistance: Min assist Ambulation Distance (Feet): 20 Feet(10' to bathroom, 10' back, ) Assistive device: Rolling walker (2 wheeled) Gait Pattern/deviations: Step-through pattern;Decreased step length - left;Decreased stance time - left;Decreased weight shift to left;Shuffle;Trunk flexed Gait velocity: slowed Gait velocity interpretation: Below normal speed for age/gender General Gait Details: minA for steadying with RW, required 1x seated rest break on way to bathroom due to fatigue, as gait increased pt began to drag her L LE, and was not able to correct with vc      Balance Overall balance assessment: Needs assistance Sitting-balance support: No upper extremity supported;Feet supported Sitting balance-Leahy Scale: Good     Standing balance support: During functional activity;Single extremity supported Standing balance-Leahy Scale: Poor Standing balance comment: able to perform pericare with single arm support                             Pertinent Vitals/Pain Pain Assessment: No/denies pain    Home Living Family/patient expects to be discharged to:: Private residence Living Arrangements: Children Available Help at Discharge: Family;Personal care attendant Type of Home: House Home Access: Stairs to enter Entrance Stairs-Rails: Right Entrance Stairs-Number of Steps: 4 Home Layout: Two level;1/2 bath on main level;Bed/bath upstairs Home Equipment: Transport chair;Cane - single point;Walker -  2 wheels;Shower seat - built in;Hand held shower head;Grab bars - tub/shower      Prior Function Level of Independence: Needs assistance   Gait / Transfers  Assistance Needed: furniture surfs at home, uses cane and transport chair in the community  ADL's / Homemaking Assistance Needed: performs ADLs without assist, needs help with iADLs  Comments: she has close to 24/hr supervision with familiy and home sitter        Extremity/Trunk Assessment   Upper Extremity Assessment Upper Extremity Assessment: Generalized weakness    Lower Extremity Assessment Lower Extremity Assessment: LLE deficits/detail;Generalized weakness LLE Deficits / Details: ROM WFL, hip and knee strength grossly assessed at 4/5 LLE Sensation: WNL LLE Coordination: WNL       Communication   Communication: No difficulties  Cognition Arousal/Alertness: Awake/alert Behavior During Therapy: WFL for tasks assessed/performed Overall Cognitive Status: History of cognitive impairments - at baseline                                 General Comments: oriented to self, time and place, unsure of why she is in the hospital, Son present during evaluation and allowed mother to answer questions however she often had to ask for help with answers about events in the near past       General Comments General comments (skin integrity, edema, etc.): Pt son present and concerned with mother's ablity to mobilize in the home, asked about stair lift         Assessment/Plan    PT Assessment Patient needs continued PT services  PT Problem List Decreased strength;Decreased activity tolerance;Decreased balance;Decreased mobility;Decreased knowledge of use of DME;Decreased safety awareness;Decreased cognition       PT Treatment Interventions DME instruction;Gait training;Functional mobility training;Therapeutic activities;Therapeutic exercise;Balance training;Cognitive remediation;Patient/family education    PT Goals (Current goals can be found in the Care Plan section)  Acute Rehab PT Goals Patient Stated Goal: go home PT Goal Formulation: With patient/family Time For  Goal Achievement: 08/08/17 Potential to Achieve Goals: Fair    Frequency Min 2X/week   Barriers to discharge Inaccessible home environment         AM-PAC PT "6 Clicks" Daily Activity  Outcome Measure Difficulty turning over in bed (including adjusting bedclothes, sheets and blankets)?: A Little Difficulty moving from lying on back to sitting on the side of the bed? : A Lot Difficulty sitting down on and standing up from a chair with arms (e.g., wheelchair, bedside commode, etc,.)?: Unable Help needed moving to and from a bed to chair (including a wheelchair)?: A Little Help needed walking in hospital room?: A Little Help needed climbing 3-5 steps with a railing? : Total 6 Click Score: 13    End of Session Equipment Utilized During Treatment: Gait belt Activity Tolerance: Patient limited by fatigue Patient left: in chair;with call bell/phone within reach;with chair alarm set;with family/visitor present Nurse Communication: Mobility status PT Visit Diagnosis: Unsteadiness on feet (R26.81);Other abnormalities of gait and mobility (R26.89);Repeated falls (R29.6);Muscle weakness (generalized) (M62.81);Difficulty in walking, not elsewhere classified (R26.2);Other symptoms and signs involving the nervous system (E52.778)    Time: 2423-5361 PT Time Calculation (min) (ACUTE ONLY): 47 min   Charges:   PT Evaluation $PT Eval Moderate Complexity: 1 Mod PT Treatments $Gait Training: 8-22 mins $Therapeutic Activity: 8-22 mins   PT G Codes:        Ramiyah Mcclenahan B. Migdalia Dk PT, DPT Acute Rehabilitation  [phone] Pager [phone]  [phone]    New Site 07/25/2017, 1:07 PM

## 2017-07-25 NOTE — Clinical Social Work Note (Signed)
Clinical Social Work Assessment  Patient Details  Name: Carmen Duncan MRN: [id number] Date of Birth: 1940/07/13  Date of referral:  07/25/17               Reason for consult:  Facility Placement                Permission sought to share information with:  Facility Sport and exercise psychologist, Family Supports Permission granted to share information::  Yes, Verbal Permission Granted  Name::     Personal assistant::  SNF  Relationship::  Son  Contact Information:     Housing/Transportation Living arrangements for the past 2 months:  Single Family Home Source of Information:  Patient, Adult Children Patient Interpreter Needed:  None Criminal Activity/Legal Involvement Pertinent to Current Situation/Hospitalization:  No - Comment as needed Significant Relationships:  Adult Children Lives with:  Self, Adult Children Do you feel safe going back to the place where you live?  Yes Need for family participation in patient care:  Yes (Comment)(patient requested son be contacted)  Care giving concerns:  Patient lives at home with son who works during the day, and will benefit from short term rehab at discharge.   Social Worker assessment / plan:  CSW informed by [facility] that patient would like patient's son contacted to discuss SNF at discharge. CSW spoke with patient's son over the phone to discuss SNF and facility options that are closest to where the patient lives. CSW to fax out referral and follow up with bed offers.  Employment status:  Retired Nurse, adult PT Recommendations:  [place] / Referral to community resources:  [place]  Patient/Family's Response to care:  Patient and patient's son are agreeable to SNF at discharge.  Patient/Family's Understanding of and Emotional Response to Diagnosis, Current Treatment, and Prognosis:  Patient said she was ok with the idea of SNF but wanted the son contacted to make the final  decision. Patient's son said he thought rehab would be a good idea, but didn't know where she would be able to go. Patient's son would like either [place], [place], or [facility] as discharge options. Patient and son would like bed offers when available.  Emotional Assessment Appearance:  Appears stated age Attitude/Demeanor/Rapport:  Engaged Affect (typically observed):  Pleasant Orientation:  Oriented to Self, Oriented to Situation, Oriented to Place, Oriented to  Time Alcohol / Substance use:  Not Applicable Psych involvement (Current and /or in the community):  No (Comment)  Discharge Needs  Concerns to be addressed:  Care Coordination Readmission within the last 30 days:  No Current discharge risk:  Dependent with Mobility Barriers to Discharge:  Continued Medical Work up, [place], [place] 07/25/2017, 9:05 PM

## 2017-07-26 ENCOUNTER — Observation Stay (HOSPITAL_BASED_OUTPATIENT_CLINIC_OR_DEPARTMENT_OTHER): Payer: Medicare Other

## 2017-07-26 DIAGNOSIS — Z882 Allergy status to sulfonamides status: Secondary | ICD-10-CM | POA: Diagnosis not present

## 2017-07-26 DIAGNOSIS — J449 Chronic obstructive pulmonary disease, unspecified: Secondary | ICD-10-CM | POA: Diagnosis present

## 2017-07-26 DIAGNOSIS — Z95818 Presence of other cardiac implants and grafts: Secondary | ICD-10-CM | POA: Diagnosis not present

## 2017-07-26 DIAGNOSIS — E876 Hypokalemia: Secondary | ICD-10-CM | POA: Diagnosis not present

## 2017-07-26 DIAGNOSIS — F0390 Unspecified dementia without behavioral disturbance: Secondary | ICD-10-CM | POA: Diagnosis present

## 2017-07-26 DIAGNOSIS — J84112 Idiopathic pulmonary fibrosis: Secondary | ICD-10-CM | POA: Diagnosis present

## 2017-07-26 DIAGNOSIS — Z85841 Personal history of malignant neoplasm of brain: Secondary | ICD-10-CM | POA: Diagnosis not present

## 2017-07-26 DIAGNOSIS — K219 Gastro-esophageal reflux disease without esophagitis: Secondary | ICD-10-CM | POA: Diagnosis present

## 2017-07-26 DIAGNOSIS — Z8673 Personal history of transient ischemic attack (TIA), and cerebral infarction without residual deficits: Secondary | ICD-10-CM | POA: Diagnosis not present

## 2017-07-26 DIAGNOSIS — K580 Irritable bowel syndrome with diarrhea: Secondary | ICD-10-CM | POA: Diagnosis present

## 2017-07-26 DIAGNOSIS — Z91040 Latex allergy status: Secondary | ICD-10-CM | POA: Diagnosis not present

## 2017-07-26 DIAGNOSIS — I351 Nonrheumatic aortic (valve) insufficiency: Secondary | ICD-10-CM | POA: Diagnosis not present

## 2017-07-26 DIAGNOSIS — E538 Deficiency of other specified B group vitamins: Secondary | ICD-10-CM

## 2017-07-26 DIAGNOSIS — R531 Weakness: Secondary | ICD-10-CM | POA: Diagnosis not present

## 2017-07-26 DIAGNOSIS — Z90710 Acquired absence of both cervix and uterus: Secondary | ICD-10-CM | POA: Diagnosis not present

## 2017-07-26 DIAGNOSIS — I110 Hypertensive heart disease with heart failure: Secondary | ICD-10-CM | POA: Diagnosis present

## 2017-07-26 DIAGNOSIS — E860 Dehydration: Secondary | ICD-10-CM | POA: Diagnosis not present

## 2017-07-26 DIAGNOSIS — R112 Nausea with vomiting, unspecified: Secondary | ICD-10-CM | POA: Diagnosis present

## 2017-07-26 DIAGNOSIS — H9192 Unspecified hearing loss, left ear: Secondary | ICD-10-CM | POA: Diagnosis present

## 2017-07-26 DIAGNOSIS — E039 Hypothyroidism, unspecified: Secondary | ICD-10-CM | POA: Diagnosis present

## 2017-07-26 DIAGNOSIS — I5032 Chronic diastolic (congestive) heart failure: Secondary | ICD-10-CM | POA: Diagnosis present

## 2017-07-26 DIAGNOSIS — Z91048 Other nonmedicinal substance allergy status: Secondary | ICD-10-CM | POA: Diagnosis not present

## 2017-07-26 DIAGNOSIS — E785 Hyperlipidemia, unspecified: Secondary | ICD-10-CM | POA: Diagnosis present

## 2017-07-26 DIAGNOSIS — Z853 Personal history of malignant neoplasm of breast: Secondary | ICD-10-CM | POA: Diagnosis not present

## 2017-07-26 LAB — COMPREHENSIVE METABOLIC PANEL
ALBUMIN: 2.9 g/dL — AB (ref 3.5–5.0)
ALK PHOS: 53 U/L (ref 38–126)
ALT: 14 U/L (ref 14–54)
ANION GAP: 7 (ref 5–15)
AST: 21 U/L (ref 15–41)
BUN: 6 mg/dL (ref 6–20)
CALCIUM: 8.5 mg/dL — AB (ref 8.9–10.3)
CO2: 27 mmol/L (ref 22–32)
Chloride: 104 mmol/L (ref 101–111)
Creatinine, Ser: 0.53 mg/dL (ref 0.44–1.00)
GFR calc Af Amer: >60 mL/min (ref >60)
GFR calc non Af Amer: >60 mL/min (ref >60)
GLUCOSE: 90 mg/dL (ref 65–99)
POTASSIUM: 4.4 mmol/L (ref 3.5–5.1)
SODIUM: 138 mmol/L (ref 135–145)
Total Bilirubin: 0.6 mg/dL (ref 0.3–1.2)
Total Protein: 6.9 g/dL (ref 6.5–8.1)

## 2017-07-26 LAB — CBC WITH DIFFERENTIAL/PLATELET
BASOS PCT: 0 %
Basophils Absolute: 0 10*3/uL (ref 0.0–0.1)
EOS ABS: 0.3 10*3/uL (ref 0.0–0.7)
Eosinophils Relative: 3 %
HCT: 42.1 % (ref 36.0–46.0)
HEMOGLOBIN: 13.8 g/dL (ref 12.0–15.0)
Lymphocytes Relative: 17 %
Lymphs Abs: 1.5 10*3/uL (ref 0.7–4.0)
MCH: 29.3 pg (ref 26.0–34.0)
MCHC: 32.8 g/dL (ref 30.0–36.0)
MCV: 89.4 fL (ref 78.0–100.0)
Monocytes Absolute: 0.7 10*3/uL (ref 0.1–1.0)
Monocytes Relative: 7 %
NEUTROS PCT: 73 %
Neutro Abs: 6.4 10*3/uL (ref 1.7–7.7)
Platelets: 201 10*3/uL (ref 150–400)
RBC: 4.71 MIL/uL (ref 3.87–5.11)
RDW: 15.2 % (ref 11.5–15.5)
WBC: 8.9 10*3/uL (ref 4.0–10.5)

## 2017-07-26 LAB — VITAMIN B12: VITAMIN B 12: 198 pg/mL (ref 180–914)

## 2017-07-26 LAB — MAGNESIUM: Magnesium: 1.5 mg/dL — ABNORMAL LOW (ref 1.7–2.4)

## 2017-07-26 LAB — TSH: TSH: 4.588 u[IU]/mL — ABNORMAL HIGH (ref 0.350–4.500)

## 2017-07-26 LAB — ECHOCARDIOGRAM COMPLETE

## 2017-07-26 MED ORDER — MAGNESIUM SULFATE 2 GM/50ML IV SOLN
2.0000 g | Freq: Once | INTRAVENOUS | Status: AC
  Administered 2017-07-26: 2 g via INTRAVENOUS
  Filled 2017-07-26: qty 50

## 2017-07-26 MED ORDER — CYANOCOBALAMIN 1000 MCG/ML IJ SOLN
1000.0000 ug | Freq: Every day | INTRAMUSCULAR | Status: DC
  Administered 2017-07-26 – 2017-07-27 (×2): 1000 ug via INTRAMUSCULAR
  Filled 2017-07-26 (×2): qty 1

## 2017-07-26 NOTE — Progress Notes (Signed)
  Echocardiogram 2D Echocardiogram has been performed.  Merrie Roof F 07/26/2017, 9:50 AM

## 2017-07-26 NOTE — Progress Notes (Signed)
Patient ID: Carmen Duncan, female   DOB: September 17, 1940, 77 y.o.   MRN: [id number]  PROGRESS NOTE    AFIA MESSENGER  STM:[id number] DOB: February 26, 1941 DOA: 07/24/2017 PCP: [name], MD   Brief Narrative:  77 year old female with history of acoustic neuroma, asthma, breast cancer, pulmonary hypertension, hypertension, history of CVA, chronic diastolic heart failure, idiopathic pulmonary fibrosis, IBS, GERD, essential tremor and dementia presented with vomiting, diarrhea and progressively worsening gradual weakness.  She was admitted with weakness to rule out stroke.  CT abdomen on admission showed hepatic flexure thickening.  GI was consulted   Assessment & Plan:   Active Problems:   Weak   Progressive weakness  -CT head negative for acute bleed -MRI brain showed no acute stroke -PT/OT, nursing home placement.  Social worker consulted -Diet as per SLP evaluation -Fall precautions -UA negative.  Probable vitamin B12 deficiency -Vitamin B12 level at the lower limit of normal -We will start vitamin B12 intramuscular while the patient is here and transition to oral on discharge.  Probable hypothyroidism -Elevated TSH levels.  Will get T3-T4 levels  Nausea, vomiting and diarrhea -Questionable cause.  Improving.  No vomiting since admission.  Tolerating diet   dehydration -Improving.  Continue IV fluids  Hepatic flexure thickening -Cannot rule out malignancy.  GI evaluation appreciated.  GI recommends outpatient follow-up.  Hypokalemia -Replaced.  Improved  Hypomagnesemia -Replace.  Repeat a.m. Labs  Asthma/idiopathic pulmonary fibrosis -Prn albuterol -Continue ofev  Hypertension -Blood pressure stable.  Continue Norvasc and Inderal  History of stroke -Continue plavix  Hyperlipidemia -Continue statin  Dementia -Continue Aricept, lexapro  GERD -Continue PPI  Code Status:  Full DVT Prophylaxis: heparin Family Communication:  None at bedside Disposition  Plan:  Nursing home in 1-2 days   Consultants: GI  Procedures:  Bilateral carotid duplex: 1-39% carotid stenosis.  Antimicrobials: None    Subjective: Patient seen and examined at bedside.  She is awake and still feels very weak with not much improvement.  No overnight fever, nausea or vomiting.  Objective: Vitals:   07/25/17 2039 07/26/17 0021 07/26/17 0440 07/26/17 0918  BP: (!) 168/57 (!) 175/69 (!) 165/68 (!) 156/57  Pulse: 68 69 72 70  Resp: 16 16 16 20   Temp: 98.8 F (37.1 C) 98.4 F (36.9 C) 98.7 F (37.1 C) 97.9 F (36.6 C)  TempSrc: Oral Oral Oral Oral  SpO2: 95% 94% 93% 97%    Intake/Output Summary (Last 24 hours) at 07/26/2017 1112 Last data filed at 07/26/2017 0920 Gross per 24 hour  Intake 1632.5 ml  Output -  Net 1632.5 ml   There were no vitals filed for this visit.  Examination:  General exam: No acute distress.  Appears calm and comfortable, very thinly built elderly female Respiratory system: Bilateral decreased breath sounds at bases Cardiovascular system: S1-S2 positive, rate controlled  gastrointestinal system: Abdomen is nondistended, soft and nontender. Normal bowel sounds heard. Central nervous system: Awake. No focal neurological deficits. Moving extremities Extremities: No cyanosis, clubbing, edema  Skin: No rashes, lesions or ulcers Psych: Has flat affect  Data Reviewed: I have personally reviewed following labs and imaging studies  CBC: Recent Labs  Lab 07/24/17 1221 07/24/17 1254 07/26/17 0429  WBC 11.5*  --  8.9  NEUTROABS 8.6*  --  6.4  HGB 14.8 16.0* 13.8  HCT 44.7 47.0* 42.1  MCV 88.5  --  89.4  PLT 257  --  892   Basic Metabolic Panel: Recent Labs  Lab 07/24/17  1221 07/24/17 1254 07/25/17 0644 07/26/17 0429  NA 136 139 137 138  K 2.8* 2.8* 2.6* 4.4  CL 96* 95* 99* 104  CO2 28  --  27 27  GLUCOSE 102* 101* 94 90  BUN 6 6 6 6   CREATININE 0.73 0.70 0.68 0.53  CALCIUM 9.1  --  8.6* 8.5*  MG  --   --  1.6* 1.5*    GFR: CrCl cannot be calculated (Unknown ideal weight.). Liver Function Tests: Recent Labs  Lab 07/24/17 1221 07/26/17 0429  AST 26 21  ALT 13* 14  ALKPHOS 65 53  BILITOT 0.9 0.6  PROT 8.0 6.9  ALBUMIN 3.4* 2.9*   No results for input(s): LIPASE, AMYLASE in the last 168 hours. No results for input(s): AMMONIA in the last 168 hours. Coagulation Profile: Recent Labs  Lab 07/24/17 1221  INR 1.00   Cardiac Enzymes: No results for input(s): CKTOTAL, CKMB, CKMBINDEX, TROPONINI in the last 168 hours. BNP (last 3 results) No results for input(s): PROBNP in the last 8760 hours. HbA1C: Recent Labs    07/25/17 0644  HGBA1C 5.4   CBG: No results for input(s): GLUCAP in the last 168 hours. Lipid Profile: Recent Labs    07/25/17 0644  CHOL 142  HDL 32*  LDLCALC 86  TRIG 120  CHOLHDL 4.4   Thyroid Function Tests: Recent Labs    07/26/17 0429  TSH 4.588*   Anemia Panel: Recent Labs    07/26/17 0429  VITAMINB12 198   Sepsis Labs: No results for input(s): PROCALCITON, LATICACIDVEN in the last 168 hours.  No results found for this or any previous visit (from the past 240 hour(s)).       Radiology Studies: Dg Chest 2 View  Result Date: 07/24/2017 CLINICAL DATA:  Nausea for months, weakness, leaning to LEFT, difficulty ambulating up stairs to bed, old stroke, RIGHT breast cancer, hypertension, interstitial lung disease EXAM: CHEST  2 VIEW COMPARISON:  11/14/2015 FINDINGS: Enlargement of cardiac silhouette. Loop recorder projects over lower anterior LEFT chest. Mediastinal contours and pulmonary vascularity normal. Emphysematous and peripheral chronic interstitial lung disease changes of the mid to lower lungs bilaterally. Superimposed bibasilar atelectasis. No definite acute infiltrate, pleural effusion or pneumothorax. Diffuse osseous demineralization. IMPRESSION: Changes of COPD and chronic interstitial lung disease with bibasilar atelectasis. Enlargement of  cardiac silhouette. Electronically Signed   By: Lavonia Dana M.D.   On: 07/24/2017 15:29   Ct Head Wo Contrast  Result Date: 07/24/2017 CLINICAL DATA:  Headache, vomiting, dizziness, history of breast carcinoma EXAM: CT HEAD WITHOUT CONTRAST TECHNIQUE: Contiguous axial images were obtained from the base of the skull through the vertex without intravenous contrast. COMPARISON:  CT brain scan of 12/11/2015 FINDINGS: Brain: The ventricular system is prominent as are the cortical sulci, consistent with diffuse atrophy. Moderately severe small vessel ischemic changes again noted throughout the periventricular white matter with very little interval change. The septum is midline in position. No hemorrhage, mass lesion, or acute infarction is seen. Vascular: No vascular abnormality is noted on this unenhanced study. Skull: On bone window images, no calvarial abnormality is seen. Sinuses/Orbits: The paranasal sinuses are well pneumatized. Other: None. IMPRESSION: 1. No acute intracranial abnormality. 2. No significant interval change in the degree of atrophy and moderately severe small vessel ischemic change within the periventricular white matter. Electronically Signed   By: Ivar Drape M.D.   On: 07/24/2017 13:05   Mr Brain Wo Contrast  Result Date: 07/24/2017 CLINICAL DATA:  Generalized weakness,  nausea and vomiting. History of breast cancer, acoustic neuroma and stroke. EXAM: MRI HEAD WITHOUT CONTRAST TECHNIQUE: Multiplanar, multiecho pulse sequences of the brain and surrounding structures were obtained without intravenous contrast. COMPARISON:  CT HEAD July 24, 2017 and MRI of the head November 13, 2016 FINDINGS: INTRACRANIAL CONTENTS: No reduced diffusion to suggest acute ischemia. New versus more conspicuous scattered chronic micro hemorrhages (potentially technical). Stable moderate parenchymal brain volume loss. No hydrocephalus. Old small LEFT occipital lobe infarct. Confluent supratentorial patchy pontine  white matter FLAIR T2 hyperintensities worse than prior imaging. Suspicious parenchymal signal, masses, mass effect. No abnormal extra-axial fluid collections. 5 mm broad-based LEFT parietal meningioma versus hyperostosis internus. No mass effect. VASCULAR: Normal major intracranial vascular flow voids present at skull base. SKULL AND UPPER CERVICAL SPINE: No abnormal sellar expansion. No suspicious calvarial bone marrow signal. Craniocervical junction maintained. SINUSES/ORBITS: The mastoid air-cells and included paranasal sinuses are well-aerated.The included ocular globes and orbital contents are non-suspicious. Status post bilateral ocular lens implants. OTHER: None. IMPRESSION: 1. No acute intracranial process. 2. Stable moderate parenchymal brain volume loss. 3. Progressed moderate to severe chronic small vessel ischemic disease. 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 Electronically Signed   By: Elon Alas M.D.   On: 07/24/2017 18:30   Ct Abdomen Pelvis W Contrast  Result Date: 07/24/2017 CLINICAL DATA:  Nausea several months.  History of breast cancer per EXAM: CT ABDOMEN AND PELVIS WITH CONTRAST TECHNIQUE: Multidetector CT imaging of the abdomen and pelvis was performed using the standard protocol following bolus administration of intravenous contrast. CONTRAST:  75 mL ISOVUE-300 IOPAMIDOL (ISOVUE-300) INJECTION 61% COMPARISON:  01/06/2015 FINDINGS: Lower chest: Evidence of bibasilar fibrosis and bronchiectatic change. No focal airspace process or effusion. Hepatobiliary: Previous cholecystectomy. Several liver hypodensities with the largest over the dome measuring 3.8 cm likely cysts. Biliary tree is within normal. Pancreas: Normal. Spleen: Normal. Adrenals/Urinary Tract: Adrenal glands are normal. Kidneys are normal in size without hydronephrosis. Subcentimeter hypodensity over the lower pole cortex of the right kidney too small to characterize but likely cysts. Ureters and bladder are  within normal. Stomach/Bowel: Stomach and small bowel are normal. Appendix is normal. Mild focal eccentric wall thickening in the region of the hepatic flexure. Vascular/Lymphatic: Moderate calcified plaque over the abdominal aorta. No significant adenopathy. Reproductive: Previous hysterectomy. Other: No significant free fluid or focal inflammatory change. Surgical clip over the left pelvis. Musculoskeletal: Moderate degenerative changes spinal multilevel disc disease over the lumbar spine. Mild degenerate change of the hips. IMPRESSION: No acute findings in the abdomen/pelvis. Suggestion of focal eccentric wall thickening over the hepatic flexure of the colon as a mass/neoplasm is possible. No significant adjacent adenopathy. Consider endoscopic correlation. Several liver cysts with the largest over the dome measuring 3.8 cm. Subcentimeter right renal cortical hypodensity too small to characterize but likely a cyst. Aortic Atherosclerosis (ICD10-I70.0). Bibasilar fibrosis and bronchiectatic change. Electronically Signed   By: Marin Olp M.D.   On: 07/24/2017 16:03        Scheduled Meds: .  stroke: mapping our early stages of recovery book   Does not apply Once  . amLODipine  2.5 mg Oral Daily  . aspirin  324 mg Oral Once  . atorvastatin  20 mg Oral Daily  . clopidogrel  75 mg Oral Daily  . donepezil  10 mg Oral Daily  . escitalopram  10 mg Oral Daily  . Nintedanib  100 mg Oral BID  . pantoprazole  40 mg Oral Daily  . potassium chloride  40 mEq Oral Once  . propranolol  20 mg Oral Daily   Continuous Infusions: . 0.9 % NaCl with KCl 40 mEq / L 75 mL/hr (07/25/17 2310)     LOS: 0 days        Aline August, MD [facility] Pager [phone]  If 7PM-7AM, please contact night-coverage [email or web address] Password [email or web address] 07/26/2017, 11:12 AM

## 2017-07-27 DIAGNOSIS — R489 Unspecified symbolic dysfunctions: Secondary | ICD-10-CM | POA: Diagnosis not present

## 2017-07-27 DIAGNOSIS — J3489 Other specified disorders of nose and nasal sinuses: Secondary | ICD-10-CM | POA: Diagnosis not present

## 2017-07-27 DIAGNOSIS — R5381 Other malaise: Secondary | ICD-10-CM | POA: Diagnosis not present

## 2017-07-27 DIAGNOSIS — I63411 Cerebral infarction due to embolism of right middle cerebral artery: Secondary | ICD-10-CM | POA: Diagnosis not present

## 2017-07-27 DIAGNOSIS — K219 Gastro-esophageal reflux disease without esophagitis: Secondary | ICD-10-CM | POA: Diagnosis not present

## 2017-07-27 DIAGNOSIS — C719 Malignant neoplasm of brain, unspecified: Secondary | ICD-10-CM | POA: Diagnosis not present

## 2017-07-27 DIAGNOSIS — I272 Pulmonary hypertension, unspecified: Secondary | ICD-10-CM | POA: Diagnosis not present

## 2017-07-27 DIAGNOSIS — F329 Major depressive disorder, single episode, unspecified: Secondary | ICD-10-CM | POA: Diagnosis not present

## 2017-07-27 DIAGNOSIS — E538 Deficiency of other specified B group vitamins: Secondary | ICD-10-CM | POA: Diagnosis not present

## 2017-07-27 DIAGNOSIS — R110 Nausea: Secondary | ICD-10-CM | POA: Diagnosis not present

## 2017-07-27 DIAGNOSIS — E785 Hyperlipidemia, unspecified: Secondary | ICD-10-CM | POA: Diagnosis not present

## 2017-07-27 DIAGNOSIS — I10 Essential (primary) hypertension: Secondary | ICD-10-CM | POA: Diagnosis not present

## 2017-07-27 DIAGNOSIS — F0150 Vascular dementia without behavioral disturbance: Secondary | ICD-10-CM | POA: Diagnosis not present

## 2017-07-27 DIAGNOSIS — G8194 Hemiplegia, unspecified affecting left nondominant side: Secondary | ICD-10-CM | POA: Diagnosis not present

## 2017-07-27 DIAGNOSIS — I639 Cerebral infarction, unspecified: Secondary | ICD-10-CM | POA: Diagnosis not present

## 2017-07-27 DIAGNOSIS — I69354 Hemiplegia and hemiparesis following cerebral infarction affecting left non-dominant side: Secondary | ICD-10-CM | POA: Diagnosis not present

## 2017-07-27 DIAGNOSIS — E876 Hypokalemia: Secondary | ICD-10-CM | POA: Diagnosis not present

## 2017-07-27 DIAGNOSIS — I6930 Unspecified sequelae of cerebral infarction: Secondary | ICD-10-CM | POA: Diagnosis not present

## 2017-07-27 DIAGNOSIS — J84112 Idiopathic pulmonary fibrosis: Secondary | ICD-10-CM | POA: Diagnosis not present

## 2017-07-27 DIAGNOSIS — E860 Dehydration: Secondary | ICD-10-CM | POA: Diagnosis not present

## 2017-07-27 DIAGNOSIS — M6281 Muscle weakness (generalized): Secondary | ICD-10-CM | POA: Diagnosis not present

## 2017-07-27 DIAGNOSIS — E440 Moderate protein-calorie malnutrition: Secondary | ICD-10-CM | POA: Diagnosis not present

## 2017-07-27 DIAGNOSIS — G609 Hereditary and idiopathic neuropathy, unspecified: Secondary | ICD-10-CM | POA: Diagnosis not present

## 2017-07-27 DIAGNOSIS — I5032 Chronic diastolic (congestive) heart failure: Secondary | ICD-10-CM | POA: Diagnosis not present

## 2017-07-27 DIAGNOSIS — R531 Weakness: Secondary | ICD-10-CM | POA: Diagnosis not present

## 2017-07-27 LAB — BASIC METABOLIC PANEL
ANION GAP: 12 (ref 5–15)
BUN: 8 mg/dL (ref 6–20)
CO2: 21 mmol/L — ABNORMAL LOW (ref 22–32)
Calcium: 8.5 mg/dL — ABNORMAL LOW (ref 8.9–10.3)
Chloride: 103 mmol/L (ref 101–111)
Creatinine, Ser: 0.56 mg/dL (ref 0.44–1.00)
Glucose, Bld: 93 mg/dL (ref 65–99)
POTASSIUM: 4.1 mmol/L (ref 3.5–5.1)
SODIUM: 136 mmol/L (ref 135–145)

## 2017-07-27 LAB — T4, FREE: FREE T4: 1.07 ng/dL (ref 0.61–1.12)

## 2017-07-27 LAB — MAGNESIUM: MAGNESIUM: 1.8 mg/dL (ref 1.7–2.4)

## 2017-07-27 MED ORDER — DONEPEZIL HCL 5 MG PO TABS: 10.0000 mg | ORAL_TABLET | Freq: Every day | ORAL | Status: DC

## 2017-07-27 MED ORDER — VITAMIN B-12 100 MCG PO TABS: 100.0000 ug | ORAL_TABLET | Freq: Every day | ORAL | 0 refills | Status: DC

## 2017-07-27 MED ORDER — AMLODIPINE BESYLATE 2.5 MG PO TABS: 5.0000 mg | ORAL_TABLET | Freq: Every day | ORAL | Status: DC

## 2017-07-27 MED ORDER — AMLODIPINE BESYLATE 5 MG PO TABS
5.0000 mg | ORAL_TABLET | Freq: Every day | ORAL | Status: DC
  Administered 2017-07-27: 5 mg via ORAL
  Filled 2017-07-27: qty 1

## 2017-07-27 NOTE — Discharge Summary (Signed)
Physician Discharge Summary  Carmen Duncan HYQ:[id number] DOB: 10-01-1940 DOA: 07/24/2017  PCP: [name], MD  Admit date: 07/24/2017 Discharge date: 07/27/2017  Admitted From: Home Disposition:  SNF  Recommendations for Outpatient Follow-up:  1. Follow up with provider at nursing home at earliest convenience with repeat BMP in the next few days 2. Follow-up with GI/Dr. Oletta Lamas as scheduled   Home Health: No Equipment/Devices: None  Discharge Condition: Guarded CODE STATUS: Full Diet recommendation: Heart Healthy/as per SLP recommendations  Brief/Interim Summary: 77 year old female with history of acoustic neuroma, asthma, breast cancer, pulmonary hypertension, hypertension, history of CVA, chronic diastolic heart failure, idiopathic pulmonary fibrosis, IBS, GERD, essential tremor and dementia presented with vomiting, diarrhea and progressively worsening gradual weakness.  She was admitted with weakness to rule out stroke.  CT abdomen on admission showed hepatic flexure thickening.  GI was consulted.  GI recommended outpatient follow-up.  B12 level was at the lower limit of normal, she was started on B12 supplementation.  She will be discharged to nursing home on oral B12 supplementation.    Discharge Diagnoses:  Active Problems:   Weak  Progressive weakness  -CT head negative for acute bleed -MRI brain showed no acute stroke -PT/OT: nursing home placement.   -Diet as per SLP evaluation -Fall precautions -UA negative.  Probable vitamin B12 deficiency -Vitamin B12 level at the lower limit of normal -Started on vitamin B12 intramuscular inpatient.  Transition to oral on discharge.  Probable hypothyroidism -Elevated TSH levels.  Free T4 level normal.  Awaiting free T3 level.  Outpatient follow-up with repeat TSH in the next few weeks.  Nausea, vomiting and diarrhea -Questionable cause.  Improved.  No vomiting since admission.  Tolerating diet.     dehydration -Improved.  Treated with IV fluids  Hepatic flexure thickening -Cannot rule out malignancy.  GI evaluation appreciated.  GI recommends outpatient follow-up with Dr. Oletta Lamas.  Hypokalemia -Replaced.  Improved  Hypomagnesemia -Replaced.  Improved.  Repeat a.m. labs  Asthma/idiopathic pulmonary fibrosis -Prn albuterol -Continue ofev  Hypertension -Blood pressure on the higher side.  Increase Norvasc to 5 mg daily.  Continue Inderal  History of stroke -Continue plavix  Hyperlipidemia -Continue statin  Dementia -Continue Aricept, lexapro  GERD -Continue PPI    Discharge Instructions  Discharge Instructions    Call MD for:  difficulty breathing, headache or visual disturbances   Complete by:  As directed    Call MD for:  extreme fatigue   Complete by:  As directed    Call MD for:  hives   Complete by:  As directed    Call MD for:  persistant dizziness or light-headedness   Complete by:  As directed    Call MD for:  persistant nausea and vomiting   Complete by:  As directed    Call MD for:  redness, tenderness, or signs of infection (pain, swelling, redness, odor or green/yellow discharge around incision site)   Complete by:  As directed    Call MD for:  temperature >100.4   Complete by:  As directed    Diet - low sodium heart healthy   Complete by:  As directed    Increase activity slowly   Complete by:  As directed      Allergies as of 07/27/2017      Reactions   Latex Itching, Rash, Other (See Comments)   Red angry skin from latex tape   Sulfonamide Derivatives Rash   hives   Tape Itching, Rash  Please use paper tape or coban wrap!!      Medication List    TAKE these medications   acetaminophen 325 MG tablet Commonly known as:  TYLENOL Take 325 mg by mouth every 6 (six) hours as needed for headache.   amLODipine 2.5 MG tablet Commonly known as:  NORVASC Take 2 tablets (5 mg total) by mouth daily. What changed:  how much  to take   atorvastatin 20 MG tablet Commonly known as:  LIPITOR Take 1 tablet (20 mg total) by mouth daily. What changed:  Another medication with the same name was removed. Continue taking this medication, and follow the directions you see here.   clopidogrel 75 MG tablet Commonly known as:  PLAVIX TAKE ONE TABLET BY MOUTH ONCE DAILY What changed:    how much to take  how to take this  when to take this   donepezil 5 MG tablet Commonly known as:  ARICEPT Take 2 tablets (10 mg total) by mouth daily.   escitalopram 10 MG tablet Commonly known as:  LEXAPRO TAKE 1 TABLET BY MOUTH ONCE DAILY. What changed:    how much to take  how to take this  when to take this   [name] 100 MG Caps Generic drug:  Nintedanib Take 100 mg by mouth 2 (two) times daily.   omeprazole 20 MG capsule Commonly known as:  PRILOSEC Take 1 capsule (20 mg total) by mouth daily.   ondansetron 4 MG tablet Commonly known as:  ZOFRAN Take 1 tablet (4 mg total) by mouth every 8 (eight) hours as needed for nausea or vomiting.   propranolol 20 MG tablet Commonly known as:  INDERAL TAKE 1 TABLET BY MOUTH ONCE DAILY What changed:    how much to take  how to take this  when to take this   vitamin B-12 100 MCG tablet Commonly known as:  CYANOCOBALAMIN Take 1 tablet (100 mcg total) by mouth daily.       Allergies  Allergen Reactions  . Latex Itching, Rash and Other (See Comments)    Red angry skin from latex tape  . Sulfonamide Derivatives Rash    hives  . Tape Itching and Rash    Please use paper tape or coban wrap!!    Consultations: GI  Procedures/Studies: Dg Chest 2 View  Result Date: 07/24/2017 CLINICAL DATA:  Nausea for months, weakness, leaning to LEFT, difficulty ambulating up stairs to bed, old stroke, RIGHT breast cancer, hypertension, interstitial lung disease EXAM: CHEST  2 VIEW COMPARISON:  11/14/2015 FINDINGS: Enlargement of cardiac silhouette. Loop recorder projects over  lower anterior LEFT chest. Mediastinal contours and pulmonary vascularity normal. Emphysematous and peripheral chronic interstitial lung disease changes of the mid to lower lungs bilaterally. Superimposed bibasilar atelectasis. No definite acute infiltrate, pleural effusion or pneumothorax. Diffuse osseous demineralization. IMPRESSION: Changes of COPD and chronic interstitial lung disease with bibasilar atelectasis. Enlargement of cardiac silhouette. Electronically Signed   By: Lavonia Dana M.D.   On: 07/24/2017 15:29   Ct Head Wo Contrast  Result Date: 07/24/2017 CLINICAL DATA:  Headache, vomiting, dizziness, history of breast carcinoma EXAM: CT HEAD WITHOUT CONTRAST TECHNIQUE: Contiguous axial images were obtained from the base of the skull through the vertex without intravenous contrast. COMPARISON:  CT brain scan of 12/11/2015 FINDINGS: Brain: The ventricular system is prominent as are the cortical sulci, consistent with diffuse atrophy. Moderately severe small vessel ischemic changes again noted throughout the periventricular white matter with very little interval change. The septum  is midline in position. No hemorrhage, mass lesion, or acute infarction is seen. Vascular: No vascular abnormality is noted on this unenhanced study. Skull: On bone window images, no calvarial abnormality is seen. Sinuses/Orbits: The paranasal sinuses are well pneumatized. Other: None. IMPRESSION: 1. No acute intracranial abnormality. 2. No significant interval change in the degree of atrophy and moderately severe small vessel ischemic change within the periventricular white matter. Electronically Signed   By: Ivar Drape M.D.   On: 07/24/2017 13:05   Mr Brain Wo Contrast  Result Date: 07/24/2017 CLINICAL DATA:  Generalized weakness, nausea and vomiting. History of breast cancer, acoustic neuroma and stroke. EXAM: MRI HEAD WITHOUT CONTRAST TECHNIQUE: Multiplanar, multiecho pulse sequences of the brain and surrounding  structures were obtained without intravenous contrast. COMPARISON:  CT HEAD July 24, 2017 and MRI of the head November 13, 2016 FINDINGS: INTRACRANIAL CONTENTS: No reduced diffusion to suggest acute ischemia. New versus more conspicuous scattered chronic micro hemorrhages (potentially technical). Stable moderate parenchymal brain volume loss. No hydrocephalus. Old small LEFT occipital lobe infarct. Confluent supratentorial patchy pontine white matter FLAIR T2 hyperintensities worse than prior imaging. Suspicious parenchymal signal, masses, mass effect. No abnormal extra-axial fluid collections. 5 mm broad-based LEFT parietal meningioma versus hyperostosis internus. No mass effect. VASCULAR: Normal major intracranial vascular flow voids present at skull base. SKULL AND UPPER CERVICAL SPINE: No abnormal sellar expansion. No suspicious calvarial bone marrow signal. Craniocervical junction maintained. SINUSES/ORBITS: The mastoid air-cells and included paranasal sinuses are well-aerated.The included ocular globes and orbital contents are non-suspicious. Status post bilateral ocular lens implants. OTHER: None. IMPRESSION: 1. No acute intracranial process. 2. Stable moderate parenchymal brain volume loss. 3. Progressed moderate to severe chronic small vessel ischemic disease. 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 Electronically Signed   By: Elon Alas M.D.   On: 07/24/2017 18:30   Ct Abdomen Pelvis W Contrast  Result Date: 07/24/2017 CLINICAL DATA:  Nausea several months.  History of breast cancer per EXAM: CT ABDOMEN AND PELVIS WITH CONTRAST TECHNIQUE: Multidetector CT imaging of the abdomen and pelvis was performed using the standard protocol following bolus administration of intravenous contrast. CONTRAST:  75 mL ISOVUE-300 IOPAMIDOL (ISOVUE-300) INJECTION 61% COMPARISON:  01/06/2015 FINDINGS: Lower chest: Evidence of bibasilar fibrosis and bronchiectatic change. No focal airspace process or  effusion. Hepatobiliary: Previous cholecystectomy. Several liver hypodensities with the largest over the dome measuring 3.8 cm likely cysts. Biliary tree is within normal. Pancreas: Normal. Spleen: Normal. Adrenals/Urinary Tract: Adrenal glands are normal. Kidneys are normal in size without hydronephrosis. Subcentimeter hypodensity over the lower pole cortex of the right kidney too small to characterize but likely cysts. Ureters and bladder are within normal. Stomach/Bowel: Stomach and small bowel are normal. Appendix is normal. Mild focal eccentric wall thickening in the region of the hepatic flexure. Vascular/Lymphatic: Moderate calcified plaque over the abdominal aorta. No significant adenopathy. Reproductive: Previous hysterectomy. Other: No significant free fluid or focal inflammatory change. Surgical clip over the left pelvis. Musculoskeletal: Moderate degenerative changes spinal multilevel disc disease over the lumbar spine. Mild degenerate change of the hips. IMPRESSION: No acute findings in the abdomen/pelvis. Suggestion of focal eccentric wall thickening over the hepatic flexure of the colon as a mass/neoplasm is possible. No significant adjacent adenopathy. Consider endoscopic correlation. Several liver cysts with the largest over the dome measuring 3.8 cm. Subcentimeter right renal cortical hypodensity too small to characterize but likely a cyst. Aortic Atherosclerosis (ICD10-I70.0). Bibasilar fibrosis and bronchiectatic change. Electronically Signed   By: Marin Olp  M.D.   On: 07/24/2017 16:03     Subjective: Patient seen and examined at bedside.  She feels slightly better than yesterday.  Still feels weak.  No overnight fever, nausea or vomiting.   Discharge Exam: Vitals:   07/26/17 2346 07/27/17 0330  BP: (!) 166/66 (!) 168/66  Pulse: 75 70  Resp: 18 18  Temp: 99 F (37.2 C) 98.3 F (36.8 C)  SpO2: 99% 97%   Vitals:   07/26/17 1738 07/26/17 2001 07/26/17 2346 07/27/17 0330  BP:  (!) 145/60 (!) 145/57 (!) 166/66 (!) 168/66  Pulse: 64 70 75 70  Resp: 20 18 18 18   Temp: 97.9 F (36.6 C) 98.6 F (37 C) 99 F (37.2 C) 98.3 F (36.8 C)  TempSrc: Oral Oral Oral Oral  SpO2: 97% 95% 99% 97%    General exam: No acute distress.   Respiratory system: Bilateral decreased breath sounds at bases Cardiovascular system: S1-S2 positive, rate controlled  gastrointestinal system: Abdomen is nondistended, soft and nontender. Normal bowel sounds heard. Extremities: No cyanosis, clubbing, edema      The results of significant diagnostics from this hospitalization (including imaging, microbiology, ancillary and laboratory) are listed below for reference.     Microbiology: No results found for this or any previous visit (from the past 240 hour(s)).   Labs: BNP (last 3 results) No results for input(s): BNP in the last 8760 hours. Basic Metabolic Panel: Recent Labs  Lab 07/24/17 1221 07/24/17 1254 07/25/17 0644 07/26/17 0429 07/27/17 0405  NA 136 139 137 138 136  K 2.8* 2.8* 2.6* 4.4 4.1  CL 96* 95* 99* 104 103  CO2 28  --  27 27 21*  GLUCOSE 102* 101* 94 90 93  BUN 6 6 6 6 8   CREATININE 0.73 0.70 0.68 0.53 0.56  CALCIUM 9.1  --  8.6* 8.5* 8.5*  MG  --   --  1.6* 1.5* 1.8   Liver Function Tests: Recent Labs  Lab 07/24/17 1221 07/26/17 0429  AST 26 21  ALT 13* 14  ALKPHOS 65 53  BILITOT 0.9 0.6  PROT 8.0 6.9  ALBUMIN 3.4* 2.9*   No results for input(s): LIPASE, AMYLASE in the last 168 hours. No results for input(s): AMMONIA in the last 168 hours. CBC: Recent Labs  Lab 07/24/17 1221 07/24/17 1254 07/26/17 0429  WBC 11.5*  --  8.9  NEUTROABS 8.6*  --  6.4  HGB 14.8 16.0* 13.8  HCT 44.7 47.0* 42.1  MCV 88.5  --  89.4  PLT 257  --  201   Cardiac Enzymes: No results for input(s): CKTOTAL, CKMB, CKMBINDEX, TROPONINI in the last 168 hours. BNP: Invalid input(s): POCBNP CBG: No results for input(s): GLUCAP in the last 168 hours. D-Dimer No  results for input(s): DDIMER in the last 72 hours. Hgb A1c Recent Labs    07/25/17 0644  HGBA1C 5.4   Lipid Profile Recent Labs    07/25/17 0644  CHOL 142  HDL 32*  LDLCALC 86  TRIG 120  CHOLHDL 4.4   Thyroid function studies Recent Labs    07/26/17 0429  TSH 4.588*   Anemia work up Recent Labs    07/26/17 0429  VITAMINB12 198   Urinalysis    Component Value Date/Time   COLORURINE YELLOW 07/24/2017 2214   APPEARANCEUR CLEAR 07/24/2017 2214   LABSPEC 1.032 (H) 07/24/2017 2214   PHURINE 7.0 07/24/2017 2214   GLUCOSEU NEGATIVE 07/24/2017 2214   HGBUR NEGATIVE 07/24/2017 2214   BILIRUBINUR  NEGATIVE 07/24/2017 2214   BILIRUBINUR neg 10/02/2010 1108   KETONESUR NEGATIVE 07/24/2017 2214   PROTEINUR NEGATIVE 07/24/2017 2214   UROBILINOGEN 0.2 04/18/2014 2205   NITRITE NEGATIVE 07/24/2017 2214   LEUKOCYTESUR NEGATIVE 07/24/2017 2214   Sepsis Labs Invalid input(s): PROCALCITONIN,  WBC,  LACTICIDVEN Microbiology No results found for this or any previous visit (from the past 240 hour(s)).   Time coordinating discharge: 35 minutes  SIGNED:   Aline August, MD  [facility] 07/27/2017, 9:58 AM Pager: [phone]  If 7PM-7AM, please contact night-coverage [email or web address] Password [email or web address]

## 2017-07-27 NOTE — Clinical Social Work Note (Addendum)
Clinical Social Worker facilitated patient discharge including contacting patient family and facility to confirm patient discharge plans.  Clinical information faxed to facility and family agreeable with plan.  Patients family will transport pt to Center.  RN to call [phone] for report prior to discharge. Patient will go to room 102.  Clinical Social Worker will sign off for now as social work intervention is no longer needed. Please consult [place] again if new need arises.  [place], [place]

## 2017-07-27 NOTE — Progress Notes (Signed)
Patient ID: Carmen Duncan, female   DOB: 1940-12-20, 77 y.o.   MRN: [id number]  PROGRESS NOTE    Carmen Duncan  EHM:[id number] DOB: March 21, 1941 DOA: 07/24/2017 PCP: [name], MD   Brief Narrative:  77 year old female with history of acoustic neuroma, asthma, breast cancer, pulmonary hypertension, hypertension, history of CVA, chronic diastolic heart failure, idiopathic pulmonary fibrosis, IBS, GERD, essential tremor and dementia presented with vomiting, diarrhea and progressively worsening gradual weakness.  She was admitted with weakness to rule out stroke.  CT abdomen on admission showed hepatic flexure thickening.  GI was consulted   Assessment & Plan:   Active Problems:   Weak   Progressive weakness  -CT head negative for acute bleed -MRI brain showed no acute stroke -PT/OT: nursing home placement.  Social worker consulted -Diet as per SLP evaluation -Fall precautions -UA negative.  Probable vitamin B12 deficiency -Vitamin B12 level at the lower limit of normal -Continue vitamin B12 intramuscular while the patient is here and transition to oral on discharge.  Probable hypothyroidism -Elevated TSH levels.  Free T4 level normal.  Awaiting free T3 level.  Nausea, vomiting and diarrhea -Questionable cause.  Improved.  No vomiting since admission.  Tolerating diet.  Discontinue IV fluids  dehydration -Improving.  Discontinue IV fluids  Hepatic flexure thickening -Cannot rule out malignancy.  GI evaluation appreciated.  GI recommends outpatient follow-up.  Hypokalemia -Replaced.  Improved  Hypomagnesemia -Replaced.  Improved.  Repeat a.m. labs  Asthma/idiopathic pulmonary fibrosis -Prn albuterol -Continue ofev  Hypertension -Blood pressure on the higher side.  Increase Norvasc to 5 mg daily.  Continue Inderal  History of stroke -Continue plavix  Hyperlipidemia -Continue statin  Dementia -Continue Aricept, lexapro  GERD -Continue PPI  Code  Status:  Full DVT Prophylaxis: heparin Family Communication:  None at bedside Disposition Plan:  Nursing home in 1-2 days   Consultants: GI  Procedures:  Bilateral carotid duplex: 1-39% carotid stenosis.  Antimicrobials: None    Subjective: Patient seen and examined at bedside.  She feels slightly better than yesterday.  Still feels weak.  No overnight fever, nausea or vomiting.  Objective: Vitals:   07/26/17 1738 07/26/17 2001 07/26/17 2346 07/27/17 0330  BP: (!) 145/60 (!) 145/57 (!) 166/66 (!) 168/66  Pulse: 64 70 75 70  Resp: 20 18 18 18   Temp: 97.9 F (36.6 C) 98.6 F (37 C) 99 F (37.2 C) 98.3 F (36.8 C)  TempSrc: Oral Oral Oral Oral  SpO2: 97% 95% 99% 97%    Intake/Output Summary (Last 24 hours) at 07/27/2017 3662 Last data filed at 07/26/2017 2200 Gross per 24 hour  Intake 1108.75 ml  Output -  Net 1108.75 ml   There were no vitals filed for this visit.  Examination:  General exam: No acute distress.   Respiratory system: Bilateral decreased breath sounds at bases Cardiovascular system: S1-S2 positive, rate controlled  gastrointestinal system: Abdomen is nondistended, soft and nontender. Normal bowel sounds heard. Extremities: No cyanosis, clubbing, edema   Data Reviewed: I have personally reviewed following labs and imaging studies  CBC: Recent Labs  Lab 07/24/17 1221 07/24/17 1254 07/26/17 0429  WBC 11.5*  --  8.9  NEUTROABS 8.6*  --  6.4  HGB 14.8 16.0* 13.8  HCT 44.7 47.0* 42.1  MCV 88.5  --  89.4  PLT 257  --  947   Basic Metabolic Panel: Recent Labs  Lab 07/24/17 1221 07/24/17 1254 07/25/17 0644 07/26/17 0429 07/27/17 0405  NA 136 139 137  138 136  K 2.8* 2.8* 2.6* 4.4 4.1  CL 96* 95* 99* 104 103  CO2 28  --  27 27 21*  GLUCOSE 102* 101* 94 90 93  BUN 6 6 6 6 8   CREATININE 0.73 0.70 0.68 0.53 0.56  CALCIUM 9.1  --  8.6* 8.5* 8.5*  MG  --   --  1.6* 1.5* 1.8   GFR: CrCl cannot be calculated (Unknown ideal weight.). Liver  Function Tests: Recent Labs  Lab 07/24/17 1221 07/26/17 0429  AST 26 21  ALT 13* 14  ALKPHOS 65 53  BILITOT 0.9 0.6  PROT 8.0 6.9  ALBUMIN 3.4* 2.9*   No results for input(s): LIPASE, AMYLASE in the last 168 hours. No results for input(s): AMMONIA in the last 168 hours. Coagulation Profile: Recent Labs  Lab 07/24/17 1221  INR 1.00   Cardiac Enzymes: No results for input(s): CKTOTAL, CKMB, CKMBINDEX, TROPONINI in the last 168 hours. BNP (last 3 results) No results for input(s): PROBNP in the last 8760 hours. HbA1C: Recent Labs    07/25/17 0644  HGBA1C 5.4   CBG: No results for input(s): GLUCAP in the last 168 hours. Lipid Profile: Recent Labs    07/25/17 0644  CHOL 142  HDL 32*  LDLCALC 86  TRIG 120  CHOLHDL 4.4   Thyroid Function Tests: Recent Labs    07/26/17 0429 07/27/17 0405  TSH 4.588*  --   FREET4  --  1.07   Anemia Panel: Recent Labs    07/26/17 0429  VITAMINB12 198   Sepsis Labs: No results for input(s): PROCALCITON, LATICACIDVEN in the last 168 hours.  No results found for this or any previous visit (from the past 240 hour(s)).       Radiology Studies: No results found.      Scheduled Meds: . amLODipine  2.5 mg Oral Daily  . atorvastatin  20 mg Oral Daily  . clopidogrel  75 mg Oral Daily  . cyanocobalamin  1,000 mcg Intramuscular Daily  . donepezil  10 mg Oral Daily  . escitalopram  10 mg Oral Daily  . Nintedanib  100 mg Oral BID  . pantoprazole  40 mg Oral Daily  . potassium chloride  40 mEq Oral Once  . propranolol  20 mg Oral Daily   Continuous Infusions: . 0.9 % NaCl with KCl 40 mEq / L 50 mL/hr (07/26/17 1528)     LOS: 1 day        Aline August, MD [facility] Pager [phone]  If 7PM-7AM, please contact night-coverage [email or web address] Password [facility] 07/27/2017, 9:28 AM

## 2017-07-27 NOTE — Progress Notes (Signed)
Report called to RN at [facility]; family has elected to transport patient themselves; patient stayed for dinner; dressed and ready for discharge; discharge instructions given and reviewed; patient discharged out via wheelchair accompanied by her son to the SNF.

## 2017-07-27 NOTE — Clinical Social Work Note (Signed)
CSW spoke with pt, pt's son, and pt's daughter in law at bedside. Pt has previously been to [facility]. Pt was admitted to SNF from home the last time she was hospitalized due to the fact that they they could not get insurance auth over the weekend. The son took the pt home and the pt was admitted to SNF from home within 36 hours. [facility] has agreed to admit pt from home again, if insurance would authorize it--CSW explained the risk, family understanding. [facility] is another option as they have agreed to take pt with a LOG. Pt's son and daughter in law have decided they would rather get the pt to [facility] and do a facility transfer. CSW reached out to hospital liaison with [facility] and she stated it is possible to transfer pt to a alternative facility and she would work with the family to do this--family agreeable and has decided for this choice. Pt's son explained to pt and pt is agreeable at this time.    [place], [place]

## 2017-07-27 NOTE — Clinical Social Work Placement (Signed)
   CLINICAL SOCIAL WORK PLACEMENT  NOTE  Date:  07/27/2017  Patient Details  Name: Carmen Duncan MRN: [id number] Date of Birth: 1940-11-15  Clinical Social Work is seeking post-discharge placement for this patient at the [place] level of care (*CSW will initial, date and re-position this form in  chart as items are completed):      Patient/family provided with [place] Work Department's list of facilities offering this level of care within the geographic area requested by the patient (or if unable, by the patient's family).  Yes   Patient/family informed of their freedom to choose among providers that offer the needed level of care, that participate in Medicare, Medicaid or managed care program needed by the patient, have an available bed and are willing to accept the patient.      Patient/family informed of [facility]'s ownership interest in [facility] and [facility], as well as of the fact that they are under no obligation to receive care at these facilities.  PASRR submitted to EDS on       PASRR number received on       Existing PASRR number confirmed on 07/25/17     FL2 transmitted to all facilities in geographic area requested by pt/family on 07/25/17     FL2 transmitted to all facilities within larger geographic area on       Patient informed that his/her managed care company has contracts with or will negotiate with certain facilities, including the following:        Yes   Patient/family informed of bed offers received.  Patient chooses bed at [facility]     Physician recommends and patient chooses bed at      Patient to be transferred to [facility] on 07/27/17.  Patient to be transferred to facility by [facility]     Patient family notified on 07/27/17 of transfer.  Name of family member notified:  Mitch, at bedside     PHYSICIAN       Additional Comment:     _______________________________________________ Eileen Stanford, LCSW 07/27/2017, 3:36 PM

## 2017-07-28 ENCOUNTER — Non-Acute Institutional Stay (SKILLED_NURSING_FACILITY): Payer: Medicare Other | Admitting: Adult Health

## 2017-07-28 ENCOUNTER — Encounter: Payer: Self-pay | Admitting: Adult Health

## 2017-07-28 DIAGNOSIS — E785 Hyperlipidemia, unspecified: Secondary | ICD-10-CM

## 2017-07-28 DIAGNOSIS — I272 Pulmonary hypertension, unspecified: Secondary | ICD-10-CM | POA: Diagnosis not present

## 2017-07-28 DIAGNOSIS — G8194 Hemiplegia, unspecified affecting left nondominant side: Secondary | ICD-10-CM

## 2017-07-28 DIAGNOSIS — E538 Deficiency of other specified B group vitamins: Secondary | ICD-10-CM

## 2017-07-28 DIAGNOSIS — I10 Essential (primary) hypertension: Secondary | ICD-10-CM

## 2017-07-28 DIAGNOSIS — F32A Depression, unspecified: Secondary | ICD-10-CM

## 2017-07-28 DIAGNOSIS — K219 Gastro-esophageal reflux disease without esophagitis: Secondary | ICD-10-CM

## 2017-07-28 DIAGNOSIS — I5032 Chronic diastolic (congestive) heart failure: Secondary | ICD-10-CM | POA: Diagnosis not present

## 2017-07-28 DIAGNOSIS — I639 Cerebral infarction, unspecified: Secondary | ICD-10-CM | POA: Diagnosis not present

## 2017-07-28 DIAGNOSIS — F329 Major depressive disorder, single episode, unspecified: Secondary | ICD-10-CM

## 2017-07-28 DIAGNOSIS — J84112 Idiopathic pulmonary fibrosis: Secondary | ICD-10-CM

## 2017-07-28 DIAGNOSIS — E440 Moderate protein-calorie malnutrition: Secondary | ICD-10-CM

## 2017-07-28 DIAGNOSIS — G609 Hereditary and idiopathic neuropathy, unspecified: Secondary | ICD-10-CM

## 2017-07-28 LAB — T3, FREE: T3, Free: 2.6 pg/mL (ref 2.0–4.4)

## 2017-07-28 LAB — FOLATE RBC
FOLATE, RBC: 845 ng/mL (ref >498)
Folate, Hemolysate: 360.8 ng/mL
Hematocrit: 42.7 % (ref 34.0–46.6)

## 2017-07-28 NOTE — Progress Notes (Signed)
Location:   [facility] Room Number: [place] of Service:  SNF (31)   CODE STATUS: Full Code  Allergies  Allergen Reactions  . Latex Itching, Rash and Other (See Comments)    Red angry skin from latex tape  . Sulfonamide Derivatives Rash    hives  . Tape Itching and Rash    Please use paper tape or coban wrap!!    Chief Complaint  Patient presents with  . Hospitalization Follow-up    Hospital follow up    HPI:  She had been hospitalized for increased weakness; nausea and vomiting. The vomiting did improve; had questionable cause is able to tolerate diet. She was found to have hepatic flexure thickening; could not rule out malignancy. Will need to follow up with IG (Dr. Oletta Lamas). She is here for short term rehab with her goal to return back home. She denies any nausea; vomiting; no complaint of cough; shortness of breath; no change in appetite. She will continue to be followed for her chronic illnesses: CVA; diastolic heart failure; hypertension. There are no nursing concerns at this time.   Past Medical History:  Diagnosis Date  . Acoustic neuroma ([place])   . Anxiety   . Arthritis   . Asthma   . Brain cancer ([place])   . Breast cancer of upper-outer quadrant of right female breast ([place]) 07/19/2015  . Diverticulosis   . Ejection fraction   . Essential tremor    on inderal  . GERD (gastroesophageal reflux disease)   . HOH (hard of hearing) left ear  . Hyperlipidemia   . Hypertension   . IBS (irritable bowel syndrome)   . Interstitial lung disease ([place])   . Pneumonia    04-2014, CAP  . Stroke [facility])    no deficits, on plavix    Past Surgical History:  Procedure Laterality Date  . ABDOMINAL HYSTERECTOMY  1980  . BIOPSY BREAST    . BREAST LUMPECTOMY WITH RADIOACTIVE SEED AND SENTINEL LYMPH NODE BIOPSY Right 08/10/2015   Procedure: BREAST LUMPECTOMY WITH RADIOACTIVE SEED AND SENTINEL LYMPH NODE BIOPSY;  Surgeon: Autumn Messing III, MD;  Location: [place];  Service: General;  Laterality: Right;  . CHOLECYSTECTOMY  2011  . EP IMPLANTABLE DEVICE N/A 11/16/2015   Procedure: Loop Recorder Insertion;  Surgeon: [name], MD;  Location: [place] CV LAB;  Service: Cardiovascular;  Laterality: N/A;  . eyes  2007   cararacts  . TEE WITHOUT CARDIOVERSION N/A 09/05/2014   Procedure: TRANSESOPHAGEAL ECHOCARDIOGRAM (TEE);  Surgeon: Lelon Perla, MD;  Location: [facility] ENDOSCOPY;  Service: Cardiovascular;  Laterality: N/A;  . TEE WITHOUT CARDIOVERSION N/A 11/16/2015   Procedure: TRANSESOPHAGEAL ECHOCARDIOGRAM (TEE);  Surgeon: Larey Dresser, MD;  Location: [place];  Service: Cardiovascular;  Laterality: N/A;  . TONSILLECTOMY  1945    Social History   Socioeconomic History  . Marital status: Widowed    Spouse name: Not on file  . Number of children: Not on file  . Years of education: Not on file  . Highest education level: Not on file  Social Needs  . Financial resource strain: Not on file  . Food insecurity - worry: Not on file  . Food insecurity - inability: Not on file  . Transportation needs - medical: Not on file  . Transportation needs - non-medical: Not on file  Occupational History  . Not on file  Tobacco Use  . Smoking status: Never Smoker  . Smokeless tobacco: Never  Used  Substance and Sexual Activity  . Alcohol use: Yes    Alcohol/week: 0.0 oz    Comment: wine about 2 times a week   . Drug use: No  . Sexual activity: Yes    Partners: Female  Other Topics Concern  . Not on file  Social History Narrative  . Not on file   Family History  Problem Relation Age of Onset  . Hyperlipidemia Mother   . Hypertension Mother   . Hyperlipidemia Father   . Heart disease Father 39  . Stroke Father   . Lung cancer Paternal Uncle        x 4      VITAL SIGNS BP 108/64   Pulse 66   Temp 97.8 F (36.6 C)   Resp 16   Outpatient Encounter Medications as of 07/28/2017  Medication Sig  . acetaminophen  (TYLENOL) 325 MG tablet Take 325 mg by mouth every 6 (six) hours as needed for headache.   [name] amLODipine (NORVASC) 2.5 MG tablet Take 2 tablets (5 mg total) by mouth daily.  [name] atorvastatin (LIPITOR) 20 MG tablet Take 1 tablet (20 mg total) by mouth daily.  . clopidogrel (PLAVIX) 75 MG tablet Take 75 mg by mouth daily.  [name] donepezil (ARICEPT) 5 MG tablet Take 2 tablets (10 mg total) by mouth daily.  [name] escitalopram (LEXAPRO) 10 MG tablet Take 10 mg by mouth daily.  . Nintedanib ([name]) 100 MG CAPS Take 100 mg by mouth 2 (two) times daily.  [name] omeprazole (PRILOSEC) 20 MG capsule Take 1 capsule (20 mg total) by mouth daily.  . ondansetron (ZOFRAN) 4 MG tablet Take 1 tablet (4 mg total) by mouth every 8 (eight) hours as needed for nausea or vomiting.  . propranolol (INDERAL) 20 MG tablet Take 20 mg by mouth daily.  . vitamin B-12 (CYANOCOBALAMIN) 100 MCG tablet Take 1 tablet (100 mcg total) by mouth daily.  . [DISCONTINUED] clopidogrel (PLAVIX) 75 MG tablet TAKE ONE TABLET BY MOUTH ONCE DAILY (Patient not taking: Reported on 07/28/2017)  . [DISCONTINUED] escitalopram (LEXAPRO) 10 MG tablet TAKE 1 TABLET BY MOUTH ONCE DAILY. (Patient not taking: Reported on 07/28/2017)  . [DISCONTINUED] propranolol (INDERAL) 20 MG tablet TAKE 1 TABLET BY MOUTH ONCE DAILY (Patient not taking: Reported on 07/28/2017)   No facility-administered encounter medications on file as of 07/28/2017.      SIGNIFICANT DIAGNOSTIC EXAMS  TODAY:   07-24-17: mri of head: 1. No acute intracranial process. 2. Stable moderate parenchymal brain volume loss. 3. Progressed moderate to severe chronic small vessel ischemic disease. Old LEFT occipital lobe/PCA territory infarct  07-24-17: ct of abdomen and pelvis: No acute findings in the abdomen/pelvis. Suggestion of focal eccentric wall thickening over the hepatic flexure of the colon as a mass/neoplasm is possible. No significant adjacent adenopathy. Consider endoscopic correlation. Several  liver cysts with the largest over the dome measuring 3.8 cm. Subcentimeter right renal cortical hypodensity too small to characterize but likely a cyst. Aortic Atherosclerosis  Bibasilar fibrosis and bronchiectatic change.  07-24-17: chest x-ray: Changes of COPD and chronic interstitial lung disease with bibasilar atelectasis. Enlargement of cardiac silhouette.  07-24-17: ct of head: 1. No acute intracranial abnormality. 2. No significant interval change in the degree of atrophy and moderately severe small vessel ischemic change within the periventricular white matter.  07-26-17: 2-d echo: - Left ventricle: The cavity size was normal. Wall thickness was normal. Systolic function was normal. The estimated ejection fraction was in the range of  60% to 65%. Wall motion was normal; there were no regional wall motion abnormalities. Doppler parameters are consistent with abnormal left ventricular  relaxation (grade 1 diastolic dysfunction). Doppler parameters are consistent with high ventricular filling pressure. - Aortic valve: There was mild regurgitation. - Pulmonary arteries: Systolic pressure was mildly to moderately  increased. PA peak pressure: 48 mm Hg (S).   LABS REVIEWED: TODAY:   07-24-16: wbc 11.5; hgb 14.8; hct 44.6; mcv 88.5; plt 257; glucose 102; bun 6; creat 0.73; k+ 2.8; na++ 136; ca 9.1; liver normal albumin 3.4 07-25-17: hgb a1c 5.4; chol 142; ldl 86; trig 120; hdl 32; mag 1.6 07-26-17: wbc 8.9; hgb 13.8; hct 42.1; mcv 89.4; plt 201; glucose 90; bun 6; creat 0.53; k+ 4.4; na++ 138; ca 8.5; liver normal albumin 2.0; mag 1.5; tsh 4.588; vit B 12: 198 07-27-17: glucose 93; bun 8 ;creat 0.56; k+ 4.1; na++ 138; ca 8.5; mag 1.8 free T 3: 2.6; free T 4: 10.7    Review of Systems  Constitutional: Negative for malaise/fatigue.  Respiratory: Negative for cough and shortness of breath.   Cardiovascular: Negative for chest pain, palpitations and leg swelling.  Gastrointestinal: Negative for  abdominal pain, constipation and heartburn.  Musculoskeletal: Negative for back pain, joint pain and myalgias.  Skin: Negative.   Neurological: Negative for dizziness.  Psychiatric/Behavioral: The patient is not nervous/anxious.     Physical Exam  Constitutional: She appears well-developed and well-nourished. No distress.  Thin   Neck: No thyromegaly present.  Cardiovascular: Normal rate, regular rhythm and intact distal pulses.  Murmur heard. 1/6  Pulmonary/Chest: Effort normal and breath sounds normal. No respiratory distress.  Abdominal: Soft. Bowel sounds are normal. She exhibits no distension. There is no tenderness.  Musculoskeletal: She exhibits no edema.  Is able to move all extremities  Has left side hemiparesis   Lymphadenopathy:    She has no cervical adenopathy.  Neurological: She is alert.  Skin: Skin is warm and dry. She is not diaphoretic.  Psychiatric: She has a normal mood and affect.     ASSESSMENT/ PLAN:  TODAY:   1. Essential hypertension; has pulmonary hypertension: stable b/p 108/64: will continue norvasc 5 mg daily and inderal 20 mg daily   2. CVA: has left side hemiparesis neurologically stable: will continue plavix 75 mg daily   3. Chronic diastolic congestive heart failure: stable EF 60-65% (07-26-17): will not make changes will monitor her status.   4. IPF( idiopathic pulmonary fibrosis): stable will continue nintedanib 100 mg twice daily   5. GERD: stable will continue prilosec 20 mg daily   6. Essential tremor: no changes in status will continue to monitor   7. Vascular dementia without behavioral disturbance; without change in status: will continue aricept 10 mg daily   8. Dyslipidemia:stable ldl 86; will continue lipitor 20 mg daily   9. Malnutrition of moderate degree: albumin 2.9; will continue supplements per facility protocol   10. Depression is stable will continue lexapro 10 mg daily  11. Vit B 12 deficiency: b12: 198 is stable  will continue vit B 12 1000 mcg daily  12. Hepatic flexure thickening: will have her follow up with GI (Dr. Oletta Lamas)  Will check bmp    MD is aware of resident's narcotic use and is in agreement with current plan of care. We will attempt to wean resident as apropriate   Ok Edwards NP [facility], [facility], [facility]  Contact [phone] Monday through Friday 8am- 5pm  After hours call [phone]

## 2017-07-29 LAB — BASIC METABOLIC PANEL
BUN: 13 (ref 4–21)
CREATININE: 0.5 (ref 0.5–1.1)
Glucose: 93
POTASSIUM: 4 (ref 3.4–5.3)
SODIUM: 140 (ref 137–147)

## 2017-07-30 ENCOUNTER — Encounter: Payer: Self-pay | Admitting: Adult Health

## 2017-07-30 ENCOUNTER — Ambulatory Visit: Payer: Self-pay | Admitting: Neurology

## 2017-07-30 NOTE — Progress Notes (Signed)
Entered in error

## 2017-07-31 ENCOUNTER — Non-Acute Institutional Stay (SKILLED_NURSING_FACILITY): Payer: Medicare Other | Admitting: Internal Medicine

## 2017-07-31 ENCOUNTER — Encounter: Payer: Self-pay | Admitting: Internal Medicine

## 2017-07-31 DIAGNOSIS — R5381 Other malaise: Secondary | ICD-10-CM

## 2017-07-31 DIAGNOSIS — E440 Moderate protein-calorie malnutrition: Secondary | ICD-10-CM

## 2017-07-31 DIAGNOSIS — I10 Essential (primary) hypertension: Secondary | ICD-10-CM

## 2017-07-31 DIAGNOSIS — E538 Deficiency of other specified B group vitamins: Secondary | ICD-10-CM | POA: Diagnosis not present

## 2017-07-31 DIAGNOSIS — J3489 Other specified disorders of nose and nasal sinuses: Secondary | ICD-10-CM

## 2017-07-31 DIAGNOSIS — J84112 Idiopathic pulmonary fibrosis: Secondary | ICD-10-CM

## 2017-07-31 DIAGNOSIS — I5032 Chronic diastolic (congestive) heart failure: Secondary | ICD-10-CM

## 2017-07-31 DIAGNOSIS — G8194 Hemiplegia, unspecified affecting left nondominant side: Secondary | ICD-10-CM

## 2017-07-31 DIAGNOSIS — I6930 Unspecified sequelae of cerebral infarction: Secondary | ICD-10-CM

## 2017-07-31 NOTE — Progress Notes (Signed)
Patient ID: Carmen Duncan, female   DOB: [birth year], 77 y.o.   MRN: [id number]   Provider:  DR Arletha Grippe Location:  [facility] Room Number: [place] of Service:  SNF (31)  PCP: [name], MD Patient Care Team: [name], MD as PCP - General Magrinat, Virgie Dad, MD as Consulting Physician (Oncology) Jovita Kussmaul, MD as Consulting Physician (General Surgery) Kyung Rudd, MD as Consulting Physician (Radiation Oncology) Rigoberto Noel, MD as Consulting Physician (Pulmonary Disease)  Extended Emergency Contact Information Primary Emergency Contact: Camp,Mitch Address: [address] [place], [place] [address] Johnnette Litter of [place] Phone: [phone] Work Phone: [phone] Mobile Phone: [phone] Relation: Son Secondary Emergency Contact: Camp,Donna Address: [address] [place], [place] [address] Johnnette Litter of [place] Phone: [phone] Mobile Phone: [phone] Relation: Relative  Code Status: Full Code Goals of Care: Advanced Directive information Advanced Directives 07/31/2017  Does Patient Have a Medical Advance Directive? No  Type of Paramedic of [place];Living will  Copy of Effort in Chart? Yes  Would patient like information on creating a medical advance directive? No - Patient declined      Chief Complaint  Patient presents with  . New Admit To SNF    Admission    HPI: Patient is a 77 y.o. female seen today for admission to SNF following hospital stay for progressive weakness, B12 deficiency, elevated TSH, dehydration, hepatic thickening, electrolyte derangement, HTN, asthma/idiopathic pulmonary fibrosis, hyperlipidemia, dementia, GERD. CT head neg for acute process. MRI brain showed no acute process. She was given IM B12 -->po B12. Imaging showed thickening of hepatic flexure. Albumin 3.4-->2.9; K dropped 2.6-->4.1; A1c 5.4%; LDL 86;  HDL 32; Mg 1.6-->1.8; Hgb 16-->13.8; TSH 4.588; B12 level 198 at d/c. She presents to SNF for short term rehab.  Today she reports occasional nosebleed. No dysphagia. No N/V, f/c. No sore throat. No CP/SOB. No nursing issues, falls. Sleeps ok. Appetite ok.  HTN / pulmonary hypertension - BP stable on norvasc 5 mg daily and inderal 20 mg daily   Hx CVA with left side hemiparesis - stable on plavix 75 mg daily   Chronic diastolic congestive heart failure  -  Stable. EF 60-65% (07-26-17). She does not take any meds for HF  IPF( idiopathic pulmonary fibrosis) - stable on nintedanib 100 mg twice daily   GERD - stable on prilosec 20 mg daily   Essential tremor - benign;   Vascular dementia  - stable on aricept 10 mg daily   Dyslipidemia:stable ldl 86; will continue lipitor 20 mg daily   Depression - mood stable on lexapro 10 mg daily  Past Medical History:  Diagnosis Date  . Acoustic neuroma ([place])   . Anxiety   . Arthritis   . Asthma   . Brain cancer ([place])   . Breast cancer of upper-outer quadrant of right female breast ([place]) 07/19/2015  . Diverticulosis   . Ejection fraction   . Essential tremor    on inderal  . GERD (gastroesophageal reflux disease)   . HOH (hard of hearing) left ear  . Hyperlipidemia   . Hypertension   . IBS (irritable bowel syndrome)   . Interstitial lung disease ([place])   . Pneumonia    04-2014, CAP  . Stroke [facility])    no deficits, on plavix  Past Surgical History:  Procedure Laterality Date  . ABDOMINAL HYSTERECTOMY  1980  . BIOPSY BREAST    . BREAST LUMPECTOMY WITH RADIOACTIVE SEED AND SENTINEL LYMPH NODE BIOPSY Right 08/10/2015   Procedure: BREAST LUMPECTOMY WITH RADIOACTIVE SEED AND SENTINEL LYMPH NODE BIOPSY;  Surgeon: Autumn Messing III, MD;  Location: [facility];  Service: General;  Laterality: Right;  . CHOLECYSTECTOMY  2011  . EP IMPLANTABLE DEVICE N/A 11/16/2015   Procedure: Loop Recorder Insertion;  Surgeon: [name], MD;   Location: [facility] CV LAB;  Service: Cardiovascular;  Laterality: N/A;  . eyes  2007   cararacts  . TEE WITHOUT CARDIOVERSION N/A 09/05/2014   Procedure: TRANSESOPHAGEAL ECHOCARDIOGRAM (TEE);  Surgeon: Lelon Perla, MD;  Location: [facility] ENDOSCOPY;  Service: Cardiovascular;  Laterality: N/A;  . TEE WITHOUT CARDIOVERSION N/A 11/16/2015   Procedure: TRANSESOPHAGEAL ECHOCARDIOGRAM (TEE);  Surgeon: Larey Dresser, MD;  Location: [facility] ENDOSCOPY;  Service: Cardiovascular;  Laterality: N/A;  . TONSILLECTOMY  1945    reports that  has never smoked. she has never used smokeless tobacco. She reports that she drinks alcohol. She reports that she does not use drugs. Social History   Socioeconomic History  . Marital status: Widowed    Spouse name: Not on file  . Number of children: Not on file  . Years of education: Not on file  . Highest education level: Not on file  Social Needs  . Financial resource strain: Not on file  . Food insecurity - worry: Not on file  . Food insecurity - inability: Not on file  . Transportation needs - medical: Not on file  . Transportation needs - non-medical: Not on file  Occupational History  . Not on file  Tobacco Use  . Smoking status: Never Smoker  . Smokeless tobacco: Never Used  Substance and Sexual Activity  . Alcohol use: Yes    Alcohol/week: 0.0 oz    Comment: wine about 2 times a week   . Drug use: No  . Sexual activity: Yes    Partners: Female  Other Topics Concern  . Not on file  Social History Narrative  . Not on file    Functional Status Survey:    Family History  Problem Relation Age of Onset  . Hyperlipidemia Mother   . Hypertension Mother   . Hyperlipidemia Father   . Heart disease Father 60  . Stroke Father   . Lung cancer Paternal Uncle        x 4    Health Maintenance  Topic Date Due  . DEXA SCAN  07/31/2018 (Originally 07/12/2003)  . TETANUS/TDAP  10/01/2020  . INFLUENZA VACCINE  Completed  . PNA vac Low Risk Adult   Completed    Allergies  Allergen Reactions  . Latex Itching, Rash and Other (See Comments)    Red angry skin from latex tape  . Sulfonamide Derivatives Rash    hives  . Tape Itching and Rash    Please use paper tape or coban wrap!!    Outpatient Encounter Medications as of 07/31/2017  Medication Sig  . acetaminophen (TYLENOL) 325 MG tablet Take 325 mg by mouth every 6 (six) hours as needed for headache.   [name] amLODipine (NORVASC) 2.5 MG tablet Take 2 tablets (5 mg total) by mouth daily.  [name] atorvastatin (LIPITOR) 20 MG tablet Take 1 tablet (20 mg total) by mouth daily.  . clopidogrel (PLAVIX) 75 MG tablet Take 75 mg by mouth daily.  [name] donepezil (  ARICEPT) 5 MG tablet Take 2 tablets (10 mg total) by mouth daily.  [name] escitalopram (LEXAPRO) 10 MG tablet Take 10 mg by mouth daily.  . Nintedanib ([name]) 100 MG CAPS Take 100 mg by mouth 2 (two) times daily.  [name] omeprazole (PRILOSEC) 20 MG capsule Take 1 capsule (20 mg total) by mouth daily.  . ondansetron (ZOFRAN) 4 MG tablet Take 1 tablet (4 mg total) by mouth every 8 (eight) hours as needed for nausea or vomiting.  . propranolol (INDERAL) 20 MG tablet Take 20 mg by mouth daily.  . vitamin B-12 (CYANOCOBALAMIN) 100 MCG tablet Take 1 tablet (100 mcg total) by mouth daily.   No facility-administered encounter medications on file as of 07/31/2017.     Review of Systems  Unable to perform ROS: Dementia    Vitals:   07/31/17 1027  BP: 124/80  Pulse: 70  Resp: 16  Temp: 98.1 F (36.7 C)  SpO2: 98%  Weight: 112 lb 12.8 oz (51.2 kg)  Height: 5' 0.9" (1.547 m)   Body mass index is 21.38 kg/m. Physical Exam  Constitutional: She appears well-developed.  Frail appearing in nAD, sitting in w/c  HENT:  Mouth/Throat: Oropharynx is clear and moist. No oropharyngeal exudate.  MMM; no oral thrush; nares dry b/l  Eyes: Pupils are equal, round, and reactive to light. No scleral icterus.  Neck: Neck supple. Carotid bruit is not present. No tracheal  deviation present. No thyromegaly present.  Cardiovascular: Normal rate, regular rhythm and intact distal pulses. Exam reveals no gallop and no friction rub.  Murmur (1/6 SEM) heard. No LE edema b/l. no calf TTP.   Pulmonary/Chest: Effort normal and breath sounds normal. No stridor. No respiratory distress. She has no wheezes. She has no rales.  Abdominal: Soft. Normal appearance and bowel sounds are normal. She exhibits no distension and no mass. There is no hepatomegaly. There is no tenderness. There is no rigidity, no rebound and no guarding. No hernia.  Musculoskeletal: She exhibits edema and deformity (thoracic kyphosis).  Lymphadenopathy:    She has no cervical adenopathy.  Neurological: She is alert.  Skin: Skin is warm and dry. No rash noted.  Psychiatric: She has a normal mood and affect. Her behavior is normal.    Labs reviewed: Basic Metabolic Panel: Recent Labs    07/25/17 0644 07/26/17 0429 07/27/17 0405 07/29/17  NA 137 138 136 140  K 2.6* 4.4 4.1 4.0  CL 99* 104 103  --   CO2 27 27 21*  --   GLUCOSE 94 90 93  --   BUN 6 6 8 13   CREATININE 0.68 0.53 0.56 0.5  CALCIUM 8.6* 8.5* 8.5*  --   MG 1.6* 1.5* 1.8  --    Liver Function Tests: Recent Labs    08/15/16 1638 07/24/17 1221 07/26/17 0429  AST 13 26 21   ALT 9 13* 14  ALKPHOS 47 65 53  BILITOT 0.4 0.9 0.6  PROT 7.2 8.0 6.9  ALBUMIN 3.7 3.4* 2.9*   No results for input(s): LIPASE, AMYLASE in the last 8760 hours. No results for input(s): AMMONIA in the last 8760 hours. CBC: Recent Labs    07/24/17 1221 07/24/17 1254 07/26/17 0429  WBC 11.5*  --  8.9  NEUTROABS 8.6*  --  6.4  HGB 14.8 16.0* 13.8  HCT 44.7 47.0* 42.1  42.7  MCV 88.5  --  89.4  PLT 257  --  201   Cardiac Enzymes: No results for input(s): CKTOTAL, CKMB,  CKMBINDEX, TROPONINI in the last 8760 hours. BNP: Invalid input(s): POCBNP Lab Results  Component Value Date   HGBA1C 5.4 07/25/2017   Lab Results  Component Value Date    TSH 4.588 (H) 07/26/2017   Lab Results  Component Value Date   VITAMINB12 198 07/26/2017   Lab Results  Component Value Date   FOLATE 21.7 08/04/2012   No results found for: IRON, TIBC, FERRITIN  Imaging and Procedures obtained prior to SNF admission: Dg Chest 2 View  Result Date: 07/24/2017 CLINICAL DATA:  Nausea for months, weakness, leaning to LEFT, difficulty ambulating up stairs to bed, old stroke, RIGHT breast cancer, hypertension, interstitial lung disease EXAM: CHEST  2 VIEW COMPARISON:  11/14/2015 FINDINGS: Enlargement of cardiac silhouette. Loop recorder projects over lower anterior LEFT chest. Mediastinal contours and pulmonary vascularity normal. Emphysematous and peripheral chronic interstitial lung disease changes of the mid to lower lungs bilaterally. Superimposed bibasilar atelectasis. No definite acute infiltrate, pleural effusion or pneumothorax. Diffuse osseous demineralization. IMPRESSION: Changes of COPD and chronic interstitial lung disease with bibasilar atelectasis. Enlargement of cardiac silhouette. Electronically Signed   By: Lavonia Dana M.D.   On: 07/24/2017 15:29   Ct Head Wo Contrast  Result Date: 07/24/2017 CLINICAL DATA:  Headache, vomiting, dizziness, history of breast carcinoma EXAM: CT HEAD WITHOUT CONTRAST TECHNIQUE: Contiguous axial images were obtained from the base of the skull through the vertex without intravenous contrast. COMPARISON:  CT brain scan of 12/11/2015 FINDINGS: Brain: The ventricular system is prominent as are the cortical sulci, consistent with diffuse atrophy. Moderately severe small vessel ischemic changes again noted throughout the periventricular white matter with very little interval change. The septum is midline in position. No hemorrhage, mass lesion, or acute infarction is seen. Vascular: No vascular abnormality is noted on this unenhanced study. Skull: On bone window images, no calvarial abnormality is seen. Sinuses/Orbits: The  paranasal sinuses are well pneumatized. Other: None. IMPRESSION: 1. No acute intracranial abnormality. 2. No significant interval change in the degree of atrophy and moderately severe small vessel ischemic change within the periventricular white matter. Electronically Signed   By: Ivar Drape M.D.   On: 07/24/2017 13:05   Mr Brain Wo Contrast  Result Date: 07/24/2017 CLINICAL DATA:  Generalized weakness, nausea and vomiting. History of breast cancer, acoustic neuroma and stroke. EXAM: MRI HEAD WITHOUT CONTRAST TECHNIQUE: Multiplanar, multiecho pulse sequences of the brain and surrounding structures were obtained without intravenous contrast. COMPARISON:  CT HEAD July 24, 2017 and MRI of the head November 13, 2016 FINDINGS: INTRACRANIAL CONTENTS: No reduced diffusion to suggest acute ischemia. New versus more conspicuous scattered chronic micro hemorrhages (potentially technical). Stable moderate parenchymal brain volume loss. No hydrocephalus. Old small LEFT occipital lobe infarct. Confluent supratentorial patchy pontine white matter FLAIR T2 hyperintensities worse than prior imaging. Suspicious parenchymal signal, masses, mass effect. No abnormal extra-axial fluid collections. 5 mm broad-based LEFT parietal meningioma versus hyperostosis internus. No mass effect. VASCULAR: Normal major intracranial vascular flow voids present at skull base. SKULL AND UPPER CERVICAL SPINE: No abnormal sellar expansion. No suspicious calvarial bone marrow signal. Craniocervical junction maintained. SINUSES/ORBITS: The mastoid air-cells and included paranasal sinuses are well-aerated.The included ocular globes and orbital contents are non-suspicious. Status post bilateral ocular lens implants. OTHER: None. IMPRESSION: 1. No acute intracranial process. 2. Stable moderate parenchymal brain volume loss. 3. Progressed moderate to severe chronic small vessel ischemic disease. 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  Electronically Signed   By: Elon Alas M.D.   On:  07/24/2017 18:30   Ct Abdomen Pelvis W Contrast  Result Date: 07/24/2017 CLINICAL DATA:  Nausea several months.  History of breast cancer per EXAM: CT ABDOMEN AND PELVIS WITH CONTRAST TECHNIQUE: Multidetector CT imaging of the abdomen and pelvis was performed using the standard protocol following bolus administration of intravenous contrast. CONTRAST:  75 mL ISOVUE-300 IOPAMIDOL (ISOVUE-300) INJECTION 61% COMPARISON:  01/06/2015 FINDINGS: Lower chest: Evidence of bibasilar fibrosis and bronchiectatic change. No focal airspace process or effusion. Hepatobiliary: Previous cholecystectomy. Several liver hypodensities with the largest over the dome measuring 3.8 cm likely cysts. Biliary tree is within normal. Pancreas: Normal. Spleen: Normal. Adrenals/Urinary Tract: Adrenal glands are normal. Kidneys are normal in size without hydronephrosis. Subcentimeter hypodensity over the lower pole cortex of the right kidney too small to characterize but likely cysts. Ureters and bladder are within normal. Stomach/Bowel: Stomach and small bowel are normal. Appendix is normal. Mild focal eccentric wall thickening in the region of the hepatic flexure. Vascular/Lymphatic: Moderate calcified plaque over the abdominal aorta. No significant adenopathy. Reproductive: Previous hysterectomy. Other: No significant free fluid or focal inflammatory change. Surgical clip over the left pelvis. Musculoskeletal: Moderate degenerative changes spinal multilevel disc disease over the lumbar spine. Mild degenerate change of the hips. IMPRESSION: No acute findings in the abdomen/pelvis. Suggestion of focal eccentric wall thickening over the hepatic flexure of the colon as a mass/neoplasm is possible. No significant adjacent adenopathy. Consider endoscopic correlation. Several liver cysts with the largest over the dome measuring 3.8 cm. Subcentimeter right renal cortical hypodensity too small  to characterize but likely a cyst. Aortic Atherosclerosis (ICD10-I70.0). Bibasilar fibrosis and bronchiectatic change. Electronically Signed   By: Marin Olp M.D.   On: 07/24/2017 16:03    Assessment/Plan   ICD-10-CM   1. Nose dryness J34.89   2. Physical deconditioning R53.81   3. Vitamin B 12 deficiency E53.8   4. Malnutrition of moderate degree (HCC) E44.0   5. Left hemiparesis (HCC) G81.94   6. History of CVA with residual deficit I69.30   7. IPF (idiopathic pulmonary fibrosis) ([place]) J84.112   8. Essential hypertension I10   9. Chronic diastolic congestive heart failure (HCC) I50.32     START SALINE NASAL SPRAY 2 SPRAYS EACH NOSTRIL EVERY SHIFT FOR DRY NOSE  Cont other meds as ordered  PT/OT/ST as ordered  F/u with GI Dr Oletta Lamas as scheduled  GOAL: short term rehab then d/c to ALF when appropriate. Communicated with pt and nursing.  Will follow  Labs/tests ordered: none    Stephenie Navejas S. Perlie Gold  [facility] [address] [place], [place] [address] [phone] Cell (Monday-Friday 8 AM - 5 PM) [phone] After 5 PM and follow prompts

## 2017-08-07 ENCOUNTER — Non-Acute Institutional Stay (SKILLED_NURSING_FACILITY): Payer: Medicare Other | Admitting: Adult Health

## 2017-08-07 ENCOUNTER — Encounter: Payer: Self-pay | Admitting: Adult Health

## 2017-08-07 DIAGNOSIS — I639 Cerebral infarction, unspecified: Secondary | ICD-10-CM

## 2017-08-07 DIAGNOSIS — I5032 Chronic diastolic (congestive) heart failure: Secondary | ICD-10-CM | POA: Diagnosis not present

## 2017-08-07 DIAGNOSIS — F0150 Vascular dementia without behavioral disturbance: Secondary | ICD-10-CM

## 2017-08-07 DIAGNOSIS — J84112 Idiopathic pulmonary fibrosis: Secondary | ICD-10-CM | POA: Diagnosis not present

## 2017-08-07 NOTE — Progress Notes (Signed)
Location:   [facility] Room Number: [place] of Service:  SNF (31)   CODE STATUS: Full Code  Allergies  Allergen Reactions  . Latex Itching, Rash and Other (See Comments)    Red angry skin from latex tape  . Sulfonamide Derivatives Rash    hives  . Tape Itching and Rash    Please use paper tape or coban wrap!!    Chief Complaint  Patient presents with  . Acute Visit    Care Plan Meeting    HPI:  We have come together for her care plan meeting. She is present along with her son and daughter in law. Her family would like to take her back to their home if possible. She will need to be able to climb 16 steps to get to her bedroom. She is unable to climb steps due to fatigue. She does require cueing for her adls. We did discuss assisted living; home with sitter; pace. Her family did discuss a chair lift for the stair case. We did discuss her dementia and adding namenda to her regimen. We spent 50 minutes discussing her advanced directives; with her family present and have filled out her MOST form.    Past Medical History:  Diagnosis Date  . Acoustic neuroma ([place])   . Anxiety   . Arthritis   . Asthma   . Brain cancer ([place])   . Breast cancer of upper-outer quadrant of right female breast ([place]) 07/19/2015  . Diabetes mellitus without complication ([place])   . Diverticulosis   . Ejection fraction   . Essential tremor    on inderal  . GERD (gastroesophageal reflux disease)   . HOH (hard of hearing) left ear  . Hyperlipidemia   . Hypertension   . IBS (irritable bowel syndrome)   . Interstitial lung disease ([place])   . Pneumonia    04-2014, CAP  . Stroke [facility])    no deficits, on plavix    Past Surgical History:  Procedure Laterality Date  . ABDOMINAL HYSTERECTOMY  1980  . BIOPSY BREAST    . BREAST LUMPECTOMY WITH RADIOACTIVE SEED AND SENTINEL LYMPH NODE BIOPSY Right 08/10/2015   Procedure: BREAST LUMPECTOMY WITH RADIOACTIVE SEED AND SENTINEL LYMPH NODE  BIOPSY;  Surgeon: Autumn Messing III, MD;  Location: [place];  Service: General;  Laterality: Right;  . CHOLECYSTECTOMY  2011  . EP IMPLANTABLE DEVICE N/A 11/16/2015   Procedure: Loop Recorder Insertion;  Surgeon: [name], MD;  Location: [place] CV LAB;  Service: Cardiovascular;  Laterality: N/A;  . eyes  2007   cararacts  . TEE WITHOUT CARDIOVERSION N/A 09/05/2014   Procedure: TRANSESOPHAGEAL ECHOCARDIOGRAM (TEE);  Surgeon: Lelon Perla, MD;  Location: [facility] ENDOSCOPY;  Service: Cardiovascular;  Laterality: N/A;  . TEE WITHOUT CARDIOVERSION N/A 11/16/2015   Procedure: TRANSESOPHAGEAL ECHOCARDIOGRAM (TEE);  Surgeon: Larey Dresser, MD;  Location: [place];  Service: Cardiovascular;  Laterality: N/A;  . TONSILLECTOMY  1945    Social History   Socioeconomic History  . Marital status: Widowed    Spouse name: Not on file  . Number of children: Not on file  . Years of education: Not on file  . Highest education level: Not on file  Social Needs  . Financial resource strain: Not on file  . Food insecurity - worry: Not on file  . Food insecurity - inability: Not on file  . Transportation needs - medical: Not on file  . Transportation needs -  non-medical: Not on file  Occupational History  . Not on file  Tobacco Use  . Smoking status: Never Smoker  . Smokeless tobacco: Never Used  Substance and Sexual Activity  . Alcohol use: Yes    Alcohol/week: 0.0 oz    Comment: wine about 2 times a week   . Drug use: No  . Sexual activity: Yes    Partners: Female  Other Topics Concern  . Not on file  Social History Narrative  . Not on file   Family History  Problem Relation Age of Onset  . Hyperlipidemia Mother   . Hypertension Mother   . Hyperlipidemia Father   . Heart disease Father 54  . Stroke Father   . Lung cancer Paternal Uncle        x 4      VITAL SIGNS BP 120/68   Pulse 71   Temp (!) 97.4 F (36.3 C)   Resp 16   Ht 5' 0.9" (1.547 m)   Wt  112 lb 12.8 oz (51.2 kg)   SpO2 95%   BMI 21.38 kg/m   Outpatient Encounter Medications as of 08/07/2017  Medication Sig  . acetaminophen (TYLENOL) 325 MG tablet Take 325 mg by mouth every 6 (six) hours as needed for headache.   [name] amLODipine (NORVASC) 2.5 MG tablet Take 2 tablets (5 mg total) by mouth daily.  [name] atorvastatin (LIPITOR) 20 MG tablet Take 1 tablet (20 mg total) by mouth daily.  . clopidogrel (PLAVIX) 75 MG tablet Take 75 mg by mouth daily.  [name] donepezil (ARICEPT) 5 MG tablet Take 2 tablets (10 mg total) by mouth daily.  [name] escitalopram (LEXAPRO) 10 MG tablet Take 10 mg by mouth daily.  . Nintedanib ([name]) 100 MG CAPS Take 100 mg by mouth 2 (two) times daily.  [name] omeprazole (PRILOSEC) 20 MG capsule Take 1 capsule (20 mg total) by mouth daily.  . ondansetron (ZOFRAN) 4 MG tablet Take 1 tablet (4 mg total) by mouth every 8 (eight) hours as needed for nausea or vomiting.  [name] oxymetazoline (AFRIN) 0.05 % nasal spray Place 2 sprays into both nostrils 2 (two) times daily.  . propranolol (INDERAL) 20 MG tablet Take 20 mg by mouth daily.  . vitamin B-12 (CYANOCOBALAMIN) 100 MCG tablet Take 1 tablet (100 mcg total) by mouth daily.   No facility-administered encounter medications on file as of 08/07/2017.      SIGNIFICANT DIAGNOSTIC EXAMS  PREVIOUS:   07-24-17: mri of head: 1. No acute intracranial process. 2. Stable moderate parenchymal brain volume loss. 3. Progressed moderate to severe chronic small vessel ischemic disease. Old LEFT occipital lobe/PCA territory infarct  07-24-17: ct of abdomen and pelvis: No acute findings in the abdomen/pelvis. Suggestion of focal eccentric wall thickening over the hepatic flexure of the colon as a mass/neoplasm is possible. No significant adjacent adenopathy. Consider endoscopic correlation. Several liver cysts with the largest over the dome measuring 3.8 cm. Subcentimeter right renal cortical hypodensity too small to characterize but likely a  cyst. Aortic Atherosclerosis  Bibasilar fibrosis and bronchiectatic change.  07-24-17: chest x-ray: Changes of COPD and chronic interstitial lung disease with bibasilar atelectasis. Enlargement of cardiac silhouette.  07-24-17: ct of head: 1. No acute intracranial abnormality. 2. No significant interval change in the degree of atrophy and moderately severe small vessel ischemic change within the periventricular white matter.  07-26-17: 2-d echo: - Left ventricle: The cavity size was normal. Wall thickness was normal. Systolic function was normal. The estimated  ejection fraction was in the range of 60% to 65%. Wall motion was normal; there were no regional wall motion abnormalities. Doppler parameters are consistent with abnormal left ventricular  relaxation (grade 1 diastolic dysfunction). Doppler parameters are consistent with high ventricular filling pressure. - Aortic valve: There was mild regurgitation. - Pulmonary arteries: Systolic pressure was mildly to moderately  increased. PA peak pressure: 48 mm Hg (S).   NO NEW EXAMS   LABS REVIEWED: PREVIOUS:   07-24-16: wbc 11.5; hgb 14.8; hct 44.6; mcv 88.5; plt 257; glucose 102; bun 6; creat 0.73; k+ 2.8; na++ 136; ca 9.1; liver normal albumin 3.4 07-25-17: hgb a1c 5.4; chol 142; ldl 86; trig 120; hdl 32; mag 1.6 07-26-17: wbc 8.9; hgb 13.8; hct 42.1; mcv 89.4; plt 201; glucose 90; bun 6; creat 0.53; k+ 4.4; na++ 138; ca 8.5; liver normal albumin 2.0; mag 1.5; tsh 4.588; vit B 12: 198 07-27-17: glucose 93; bun 8 ;creat 0.56; k+ 4.1; na++ 138; ca 8.5; mag 1.8 free T 3: 2.6; free T 4: 10.7  NO NEW LABS     Review of Systems  Constitutional: Negative for malaise/fatigue.  Respiratory: Negative for cough and shortness of breath.   Cardiovascular: Negative for chest pain, palpitations and leg swelling.  Gastrointestinal: Negative for abdominal pain, constipation and heartburn.  Musculoskeletal: Negative for back pain, joint pain and myalgias.  Skin:  Negative.   Neurological: Negative for dizziness.  Psychiatric/Behavioral: The patient is not nervous/anxious.      Physical Exam  Constitutional: She appears well-developed and well-nourished. No distress.  Frail   Neck: No thyromegaly present.  Cardiovascular: Normal rate, regular rhythm and intact distal pulses.  Murmur heard. 1/6  Pulmonary/Chest: Effort normal and breath sounds normal. No respiratory distress.  Abdominal: Soft. Bowel sounds are normal. She exhibits no distension. There is no tenderness.  Musculoskeletal: Normal range of motion. She exhibits no edema.  Lymphadenopathy:    She has no cervical adenopathy.  Neurological: She is alert.  Skin: Skin is warm and dry. She is not diaphoretic.  Psychiatric: She has a normal mood and affect.    ASSESSMENT/ PLAN:  TODAY:   1. 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 Chronic diastolic congestive heart failure:  3. IPF( idiopathic pulmonary fibrosis):  4. Vascular dementia without behavioral disturbance;   Will begin namenda xr 7 mg daily for one week; then 14 mg daily for one week; then 21 mg daily for one week then 28 mg daily  Will continue her current plan of care  Time spent with patient 75 minutes: 50 minutes spent with advanced directives. Her goal of care discussed her family will consider: home with sitter; PACE; or assisted living. Her MOST form filled out; family verbalized understanding     MD is aware of resident's narcotic use and is in agreement with current plan of care. We will attempt to wean resident as apropriate    Ok Edwards NP [facility]  Contact [phone] Monday through Friday 8am- 5pm  After hours call [phone]

## 2017-08-13 ENCOUNTER — Non-Acute Institutional Stay: Payer: Medicare Other | Admitting: Adult Health

## 2017-08-13 ENCOUNTER — Encounter: Payer: Self-pay | Admitting: Adult Health

## 2017-08-13 DIAGNOSIS — F0150 Vascular dementia without behavioral disturbance: Secondary | ICD-10-CM | POA: Diagnosis not present

## 2017-08-13 DIAGNOSIS — G609 Hereditary and idiopathic neuropathy, unspecified: Secondary | ICD-10-CM

## 2017-08-13 DIAGNOSIS — J84112 Idiopathic pulmonary fibrosis: Secondary | ICD-10-CM | POA: Diagnosis not present

## 2017-08-13 NOTE — Progress Notes (Signed)
Location:   [facility] Room Number: [address] Place of Service:  SNF [phone])    CODE STATUS: DNR as of 08/07/17 (Most form updated 08/07/17)  Allergies  Allergen Reactions  . Latex Itching, Rash and Other (See Comments)    Red angry skin from latex tape  . Sulfonamide Derivatives Rash    hives  . Tape Itching and Rash    Please use paper tape or coban wrap!!    Chief Complaint  Patient presents with  . Discharge Note    Discharging on 08/15/17    HPI:  She is being discharged to home with home health for pt/ot/rn/cna. She will not need any dme; she has all necessary equipment at home. She will need her prescriptions to be written and will need to follow up with her medical provider.  She had been hospitalized for n/v and weakness. She was admitted to this facility for short term rehab and is now ready for discharge.     Past Medical History:  Diagnosis Date  . Acoustic neuroma ([place])   . Anxiety   . Arthritis   . Asthma   . Brain cancer ([place])   . Breast cancer of upper-outer quadrant of right female breast ([place]) 07/19/2015  . Diabetes mellitus without complication ([place])   . Diverticulosis   . Ejection fraction   . Essential tremor    on inderal  . GERD (gastroesophageal reflux disease)   . HOH (hard of hearing) left ear  . Hyperlipidemia   . Hypertension   . IBS (irritable bowel syndrome)   . Interstitial lung disease ([place])   . Pneumonia    04-2014, CAP  . Stroke [facility])    no deficits, on plavix    Past Surgical History:  Procedure Laterality Date  . ABDOMINAL HYSTERECTOMY  1980  . BIOPSY BREAST    . BREAST LUMPECTOMY WITH RADIOACTIVE SEED AND SENTINEL LYMPH NODE BIOPSY Right 08/10/2015   Procedure: BREAST LUMPECTOMY WITH RADIOACTIVE SEED AND SENTINEL LYMPH NODE BIOPSY;  Surgeon: Autumn Messing III, MD;  Location: [place];  Service: General;  Laterality: Right;  . CHOLECYSTECTOMY  2011  . EP IMPLANTABLE DEVICE N/A 11/16/2015   Procedure:  Loop Recorder Insertion;  Surgeon: [name], MD;  Location: [place] CV LAB;  Service: Cardiovascular;  Laterality: N/A;  . eyes  2007   cararacts  . TEE WITHOUT CARDIOVERSION N/A 09/05/2014   Procedure: TRANSESOPHAGEAL ECHOCARDIOGRAM (TEE);  Surgeon: Lelon Perla, MD;  Location: [facility] ENDOSCOPY;  Service: Cardiovascular;  Laterality: N/A;  . TEE WITHOUT CARDIOVERSION N/A 11/16/2015   Procedure: TRANSESOPHAGEAL ECHOCARDIOGRAM (TEE);  Surgeon: Larey Dresser, MD;  Location: [place];  Service: Cardiovascular;  Laterality: N/A;  . TONSILLECTOMY  1945    Social History   Socioeconomic History  . Marital status: Widowed    Spouse name: Not on file  . Number of children: Not on file  . Years of education: Not on file  . Highest education level: Not on file  Social Needs  . Financial resource strain: Not on file  . Food insecurity - worry: Not on file  . Food insecurity - inability: Not on file  . Transportation needs - medical: Not on file  . Transportation needs - non-medical: Not on file  Occupational History  . Not on file  Tobacco Use  . Smoking status: Never Smoker  . Smokeless tobacco: Never Used  Substance and Sexual Activity  . Alcohol use: Yes  Alcohol/week: 0.0 oz    Comment: wine about 2 times a week   . Drug use: No  . Sexual activity: Yes    Partners: Female  Other Topics Concern  . Not on file  Social History Narrative  . Not on file   Family History  Problem Relation Age of Onset  . Hyperlipidemia Mother   . Hypertension Mother   . Hyperlipidemia Father   . Heart disease Father 22  . Stroke Father   . Lung cancer Paternal Uncle        x 4    VITAL SIGNS BP 128/72   Pulse 78   Temp 97.9 F (36.6 C)   Resp 16   Ht 5' 0.9" (1.547 m)   Wt 112 lb 12.8 oz (51.2 kg)   SpO2 95%   BMI 21.38 kg/m   Patient's Medications  New Prescriptions   No medications on file  Previous Medications   ACETAMINOPHEN (TYLENOL) 325 MG TABLET    Take 325  mg by mouth every 6 (six) hours as needed for headache.    AMLODIPINE (NORVASC) 2.5 MG TABLET    Take 2 tablets (5 mg total) by mouth daily.   ATORVASTATIN (LIPITOR) 20 MG TABLET    Take 1 tablet (20 mg total) by mouth daily.   CLOPIDOGREL (PLAVIX) 75 MG TABLET    Take 75 mg by mouth daily.   DONEPEZIL (ARICEPT) 5 MG TABLET    Take 2 tablets (10 mg total) by mouth daily.   ESCITALOPRAM (LEXAPRO) 10 MG TABLET    Take 10 mg by mouth daily.   MEMANTINE ([name] TITRATION PACK) TABLET PACK    Take by mouth See admin instructions. Give 7mg  by mouth x 7 days - 08/08/17 - 08/15/17, then 21 mg by mouth x 7 days - 08/15/17 - 08/22/17, then 21 mg by mouth x 7 days, 08/22/17 - 08/29/17 then 28 mg daily   NINTEDANIB ([name]) 100 MG CAPS    Take 100 mg by mouth 2 (two) times daily.   OMEPRAZOLE (PRILOSEC) 20 MG CAPSULE    Take 1 capsule (20 mg total) by mouth daily.   ONDANSETRON (ZOFRAN) 4 MG TABLET    Take 1 tablet (4 mg total) by mouth every 8 (eight) hours as needed for nausea or vomiting.   OXYMETAZOLINE (AFRIN) 0.05 % NASAL SPRAY    Place 2 sprays into both nostrils 2 (two) times daily.   PROPRANOLOL (INDERAL) 20 MG TABLET    Take 20 mg by mouth daily.   VITAMIN B-12 (CYANOCOBALAMIN) 100 MCG TABLET    Take 1 tablet (100 mcg total) by mouth daily.  Modified Medications   No medications on file  Discontinued Medications   No medications on file     SIGNIFICANT DIAGNOSTIC EXAMS  PREVIOUS:   07-24-17: mri of head: 1. No acute intracranial process. 2. Stable moderate parenchymal brain volume loss. 3. Progressed moderate to severe chronic small vessel ischemic disease. Old LEFT occipital lobe/PCA territory infarct  07-24-17: ct of abdomen and pelvis: No acute findings in the abdomen/pelvis. Suggestion of focal eccentric wall thickening over the hepatic flexure of the colon as a mass/neoplasm is possible. No significant adjacent adenopathy. Consider endoscopic correlation. Several liver cysts with the largest  over the dome measuring 3.8 cm. Subcentimeter right renal cortical hypodensity too small to characterize but likely a cyst. Aortic Atherosclerosis  Bibasilar fibrosis and bronchiectatic change.  07-24-17: chest x-ray: Changes of COPD and chronic interstitial lung disease with bibasilar  atelectasis. Enlargement of cardiac silhouette.  07-24-17: ct of head: 1. No acute intracranial abnormality. 2. No significant interval change in the degree of atrophy and moderately severe small vessel ischemic change within the periventricular white matter.  07-26-17: 2-d echo: - Left ventricle: The cavity size was normal. Wall thickness was normal. Systolic function was normal. The estimated ejection fraction was in the range of 60% to 65%. Wall motion was normal; there were no regional wall motion abnormalities. Doppler parameters are consistent with abnormal left ventricular  relaxation (grade 1 diastolic dysfunction). Doppler parameters are consistent with high ventricular filling pressure. - Aortic valve: There was mild regurgitation. - Pulmonary arteries: Systolic pressure was mildly to moderately  increased. PA peak pressure: 48 mm Hg (S).   NO NEW EXAMS   LABS REVIEWED: PREVIOUS:   07-24-16: wbc 11.5; hgb 14.8; hct 44.6; mcv 88.5; plt 257; glucose 102; bun 6; creat 0.73; k+ 2.8; na++ 136; ca 9.1; liver normal albumin 3.4 07-25-17: hgb a1c 5.4; chol 142; ldl 86; trig 120; hdl 32; mag 1.6 07-26-17: wbc 8.9; hgb 13.8; hct 42.1; mcv 89.4; plt 201; glucose 90; bun 6; creat 0.53; k+ 4.4; na++ 138; ca 8.5; liver normal albumin 2.0; mag 1.5; tsh 4.588; vit B 12: 198 07-27-17: glucose 93; bun 8 ;creat 0.56; k+ 4.1; na++ 138; ca 8.5; mag 1.8 free T 3: 2.6; free T 4: 10.7  NO NEW LABS     Review of Systems  Constitutional: Negative for malaise/fatigue.  Respiratory: Negative for cough and shortness of breath.   Cardiovascular: Negative for chest pain, palpitations and leg swelling.  Gastrointestinal: Negative for  abdominal pain, constipation and heartburn.  Musculoskeletal: Negative for back pain, joint pain and myalgias.  Skin: Negative.   Neurological: Negative for dizziness.  Psychiatric/Behavioral: The patient is not nervous/anxious.      Physical Exam  Constitutional: She appears well-developed and well-nourished. No distress.  Frail   Neck: No thyromegaly present.  Cardiovascular: Normal rate, regular rhythm and intact distal pulses.  Murmur heard. 1/6  Pulmonary/Chest: Effort normal and breath sounds normal. No respiratory distress.  Abdominal: Soft. Bowel sounds are normal. She exhibits no distension. There is no tenderness.  Musculoskeletal: Normal range of motion. She exhibits no edema.  Lymphadenopathy:    She has no cervical adenopathy.  Neurological: She is alert.  Skin: Skin is warm and dry. She is not diaphoretic.  Psychiatric: She has a normal mood and affect.      ASSESSMENT/ PLAN:  Patient is being discharged with the following home health services:  Pt/ot/rn/cna: to evaluate and treat as indicate for gait balance strength adl training medication management and adl care.   Patient is being discharged with the following durable medical equipment:  None needed   Patient has been advised to f/u with their PCP in 1-2 weeks to bring them up to date on their rehab stay.  Social services at facility was responsible for arranging this appointment.  Pt was provided with a 30 day supply of prescriptions for medications and refills must be obtained from their PCP.  For controlled substances, a more limited supply may be provided adequate until PCP appointment only.  A 30 day supply of her prescription medications has been written as list above No narcotics written  Time spent with patient: 35 minutes: discussed; home health needs and expectations and medications; verbalized understanding.    Ok Edwards NP [facility]  Contact [phone] Monday through Friday  8am- 5pm  After hours  call [phone]

## 2017-08-17 DIAGNOSIS — I5032 Chronic diastolic (congestive) heart failure: Secondary | ICD-10-CM | POA: Diagnosis not present

## 2017-08-17 DIAGNOSIS — R413 Other amnesia: Secondary | ICD-10-CM | POA: Diagnosis not present

## 2017-08-17 DIAGNOSIS — M6281 Muscle weakness (generalized): Secondary | ICD-10-CM | POA: Diagnosis not present

## 2017-08-17 DIAGNOSIS — I69354 Hemiplegia and hemiparesis following cerebral infarction affecting left non-dominant side: Secondary | ICD-10-CM | POA: Diagnosis not present

## 2017-08-17 DIAGNOSIS — K5790 Diverticulosis of intestine, part unspecified, without perforation or abscess without bleeding: Secondary | ICD-10-CM | POA: Diagnosis not present

## 2017-08-17 DIAGNOSIS — I679 Cerebrovascular disease, unspecified: Secondary | ICD-10-CM | POA: Diagnosis not present

## 2017-08-17 DIAGNOSIS — F0150 Vascular dementia without behavioral disturbance: Secondary | ICD-10-CM | POA: Diagnosis not present

## 2017-08-17 DIAGNOSIS — F419 Anxiety disorder, unspecified: Secondary | ICD-10-CM | POA: Diagnosis not present

## 2017-08-17 DIAGNOSIS — J84112 Idiopathic pulmonary fibrosis: Secondary | ICD-10-CM | POA: Diagnosis not present

## 2017-08-17 DIAGNOSIS — J45909 Unspecified asthma, uncomplicated: Secondary | ICD-10-CM | POA: Diagnosis not present

## 2017-08-17 DIAGNOSIS — I110 Hypertensive heart disease with heart failure: Secondary | ICD-10-CM | POA: Diagnosis not present

## 2017-08-18 ENCOUNTER — Telehealth: Payer: Self-pay | Admitting: Family Medicine

## 2017-08-18 NOTE — Telephone Encounter (Signed)
Copied from [place] [id number]. Topic: Quick Communication - See Telephone Encounter >> Aug 18, 2017 10:40 AM Boyd Kerbs wrote: CRM for notification.   Informing doctor that  [facility] [phone] / [phone]  they will be doing nursing, PT & OT & home health aid.      See Telephone encounter for: 08/18/17.

## 2017-08-19 ENCOUNTER — Telehealth: Payer: Self-pay | Admitting: Family Medicine

## 2017-08-19 NOTE — Telephone Encounter (Signed)
Copied from [place] [id number]. Topic: Quick Communication - See Telephone Encounter >> Aug 19, 2017  1:04 PM Bea Graff, NT wrote: CRM for notification. See Telephone encounter for: 08/19/17. [place] with Dr. Michaell Cowing office called and states that Dr. Gerarda Fraction would like to speak with Dr. Elease Hashimoto regarding some medication concerns for this pt. CB#: [phone]

## 2017-08-19 NOTE — Telephone Encounter (Signed)
Spoke with physician from [facility].  She had recent admission and had low K and low Mg.  They would like to taper her off PPI and onto Zantac.   She has also had some loose stools possibly related to recent initiation of [name].  She has follow up in April.  Check BMP and Mg at follow up.  Consider D/C namenda then if still has loose stools.

## 2017-08-28 DIAGNOSIS — K5790 Diverticulosis of intestine, part unspecified, without perforation or abscess without bleeding: Secondary | ICD-10-CM | POA: Diagnosis not present

## 2017-08-28 DIAGNOSIS — R413 Other amnesia: Secondary | ICD-10-CM | POA: Diagnosis not present

## 2017-08-28 DIAGNOSIS — I69354 Hemiplegia and hemiparesis following cerebral infarction affecting left non-dominant side: Secondary | ICD-10-CM | POA: Diagnosis not present

## 2017-08-28 DIAGNOSIS — M6281 Muscle weakness (generalized): Secondary | ICD-10-CM | POA: Diagnosis not present

## 2017-08-28 DIAGNOSIS — I5032 Chronic diastolic (congestive) heart failure: Secondary | ICD-10-CM | POA: Diagnosis not present

## 2017-08-28 DIAGNOSIS — F419 Anxiety disorder, unspecified: Secondary | ICD-10-CM | POA: Diagnosis not present

## 2017-08-28 DIAGNOSIS — J84112 Idiopathic pulmonary fibrosis: Secondary | ICD-10-CM | POA: Diagnosis not present

## 2017-08-28 DIAGNOSIS — I110 Hypertensive heart disease with heart failure: Secondary | ICD-10-CM | POA: Diagnosis not present

## 2017-08-28 DIAGNOSIS — J45909 Unspecified asthma, uncomplicated: Secondary | ICD-10-CM | POA: Diagnosis not present

## 2017-08-28 DIAGNOSIS — F0150 Vascular dementia without behavioral disturbance: Secondary | ICD-10-CM | POA: Diagnosis not present

## 2017-08-28 DIAGNOSIS — I679 Cerebrovascular disease, unspecified: Secondary | ICD-10-CM | POA: Diagnosis not present

## 2017-09-02 ENCOUNTER — Other Ambulatory Visit: Payer: Self-pay | Admitting: Neurology

## 2017-09-09 ENCOUNTER — Encounter: Payer: Self-pay | Admitting: Family Medicine

## 2017-09-09 ENCOUNTER — Ambulatory Visit: Payer: Medicare Other | Admitting: Family Medicine

## 2017-09-09 VITALS — BP 110/74 | HR 71 | Temp 98.0°F | Wt 113.8 lb

## 2017-09-09 DIAGNOSIS — R7989 Other specified abnormal findings of blood chemistry: Secondary | ICD-10-CM | POA: Diagnosis not present

## 2017-09-09 DIAGNOSIS — R531 Weakness: Secondary | ICD-10-CM | POA: Diagnosis not present

## 2017-09-09 DIAGNOSIS — E538 Deficiency of other specified B group vitamins: Secondary | ICD-10-CM

## 2017-09-09 DIAGNOSIS — E876 Hypokalemia: Secondary | ICD-10-CM

## 2017-09-09 DIAGNOSIS — R935 Abnormal findings on diagnostic imaging of other abdominal regions, including retroperitoneum: Secondary | ICD-10-CM

## 2017-09-09 DIAGNOSIS — R195 Other fecal abnormalities: Secondary | ICD-10-CM

## 2017-09-09 LAB — VITAMIN B12: VITAMIN B 12: 299 pg/mL (ref 211–911)

## 2017-09-09 LAB — BASIC METABOLIC PANEL
BUN: 9 mg/dL (ref 6–23)
CHLORIDE: 99 meq/L (ref 96–112)
CO2: 28 mEq/L (ref 19–32)
Calcium: 8.9 mg/dL (ref 8.4–10.5)
Creatinine, Ser: 0.55 mg/dL (ref 0.40–1.20)
GFR: 113.28 mL/min (ref >60.00)
Glucose, Bld: 88 mg/dL (ref 70–99)
POTASSIUM: 3.2 meq/L — AB (ref 3.5–5.1)
SODIUM: 137 meq/L (ref 135–145)

## 2017-09-09 LAB — MAGNESIUM: MAGNESIUM: 1.6 mg/dL (ref 1.5–2.5)

## 2017-09-09 LAB — TSH: TSH: 2.57 u[IU]/mL (ref 0.35–4.50)

## 2017-09-09 NOTE — Progress Notes (Signed)
Subjective:     Patient ID: Carmen Duncan, female   DOB: [birth year], 77 y.o.   MRN: [id number]  HPI Patient is seen for rehabilitation and recent hospitalization follow-up. She has history of vascular dementia, hypertension, pulmonary hypertension, diastolic heart failure, idiopathic pulmonary fibrosis, GERD, essential tremor, B12 deficiency, hyperlipidemia, remote history of breast cancer  She presented to the ER on 07/24/17 with some vomiting, diarrhea, and gradually worsening weakness. Apparently admitted to rule out CVA. There was no mention of any focal weakness. CT abdomen on admission showed some hepatic flexure thickening of CBJ:SEGBT and GI consulted. Recommended outpatient follow-up.  B12 level was low and she was apparently initially given intramuscular B12 followed by low-dose B12 oral though she is not taking this currently. She had mildly elevated TSH of 4.5 with normal free T4 and free T3.  Patient continues to have some occasional loose stools about one per day no bloody stools. Appetite is fair. She has generalized weakness and is getting home OT and PT.  No reported abdominal pain.    Patient several lab abnormalities including low magnesium and low potassium in addition to low B12 and mildly elevated TSH as above.  No falls since discharge. They have pending follow-up with GI regarding her loose stools/diarrhea, and abnormal CT finding as above with thickening of the colon in the hepatic flexure region Her last colonoscopy was apparently about 10 years ago  Past Medical History:  Diagnosis Date  . Acoustic neuroma ([place])   . Anxiety   . Arthritis   . Asthma   . Brain cancer ([place])   . Breast cancer of upper-outer quadrant of right female breast ([place]) 07/19/2015  . Diabetes mellitus without complication ([place])   . Diverticulosis   . Ejection fraction   . Essential tremor    on inderal  . GERD (gastroesophageal reflux disease)   . HOH (hard of hearing) left ear  .  Hyperlipidemia   . Hypertension   . IBS (irritable bowel syndrome)   . Interstitial lung disease ([place])   . Pneumonia    04-2014, CAP  . Stroke [facility])    no deficits, on plavix   Past Surgical History:  Procedure Laterality Date  . ABDOMINAL HYSTERECTOMY  1980  . BIOPSY BREAST    . BREAST LUMPECTOMY WITH RADIOACTIVE SEED AND SENTINEL LYMPH NODE BIOPSY Right 08/10/2015   Procedure: BREAST LUMPECTOMY WITH RADIOACTIVE SEED AND SENTINEL LYMPH NODE BIOPSY;  Surgeon: Autumn Messing III, MD;  Location: [place];  Service: General;  Laterality: Right;  . CHOLECYSTECTOMY  2011  . EP IMPLANTABLE DEVICE N/A 11/16/2015   Procedure: Loop Recorder Insertion;  Surgeon: [name], MD;  Location: [place] CV LAB;  Service: Cardiovascular;  Laterality: N/A;  . eyes  2007   cararacts  . TEE WITHOUT CARDIOVERSION N/A 09/05/2014   Procedure: TRANSESOPHAGEAL ECHOCARDIOGRAM (TEE);  Surgeon: Lelon Perla, MD;  Location: [facility] ENDOSCOPY;  Service: Cardiovascular;  Laterality: N/A;  . TEE WITHOUT CARDIOVERSION N/A 11/16/2015   Procedure: TRANSESOPHAGEAL ECHOCARDIOGRAM (TEE);  Surgeon: Larey Dresser, MD;  Location: [place];  Service: Cardiovascular;  Laterality: N/A;  . TONSILLECTOMY  1945    reports that she has never smoked. She has never used smokeless tobacco. She reports that she drinks alcohol. She reports that she does not use drugs. family history includes Heart disease (age of onset: 42) in her father; Hyperlipidemia in her father and mother; Hypertension in her mother; Lung cancer in her paternal uncle; Stroke  in her father. Allergies  Allergen Reactions  . Latex Itching, Rash and Other (See Comments)    Red angry skin from latex tape  . Sulfonamide Derivatives Rash    hives  . Tape Itching and Rash    Please use paper tape or coban wrap!!     Review of Systems  Constitutional: Negative for appetite change, chills and fever.  Respiratory: Positive for shortness of breath  (chronic and unchanged). Negative for cough and wheezing.   Cardiovascular: Negative for chest pain, palpitations and leg swelling.  Gastrointestinal: Positive for diarrhea. Negative for abdominal pain, nausea and vomiting.  Genitourinary: Negative for dysuria.       Objective:   Physical Exam  Constitutional: She appears well-developed.  Alert and cooperative.    Neck: Neck supple.  Cardiovascular: Normal rate and regular rhythm. Exam reveals no gallop.  Pulmonary/Chest: Effort normal.  She has somewhat diminished breath sounds throughout. She has crackles in both bases which is chronic from her interstitial fibrosis  Musculoskeletal: She exhibits no edema.  Neurological: She is alert. No cranial nerve deficit.       Assessment:     #1 recent vomiting and diarrhea. She has had some loose stools. She's not been on any recent antibiotics and generally having about one loose stool per day.  #2 generalized weakness  #3 recent abnormal CT abdomen pelvis with some thickening of hepatic flexure section of colon  #4 hypokalemia during recent admission  #5 recent hypomagnesemia which was replaced during hospitalization  #6 recent mildly elevated TSH with normal free T4 free T3  #7 recent low B12 =198    Plan:     -Recheck labs with basic metabolic panel, TSH, M76 level, magnesium level -Continue home OT and PT -Consider occasional Imodium supplement but not regularly. They will also consider fiber supplement to see if this helps with her loose stools. We did not check for C. difficile given no recent antibiotics and only about 1 loose to diarrhea stool per day -Encouraged to follow through with GI follow-up as outpatient Not sure she is a good candidate for colonoscopy given her debilitated state.  -over 40 minutes spent over which > 50% in counseling regarding recent events, labs, home services, medications.  [name] MD [facility]  -

## 2017-09-09 NOTE — Patient Instructions (Signed)
Food Choices to Help Relieve Diarrhea, Adult  When you have diarrhea, the foods you eat and your eating habits are very important. Choosing the right foods and drinks can help:  · Relieve diarrhea.  · Replace lost fluids and nutrients.  · Prevent dehydration.    What general guidelines should I follow?  Relieving diarrhea  · Choose foods with less than 2 g or .07 oz. of fiber per serving.  · Limit fats to less than 8 tsp (38 g or 1.34 oz.) a day.  · Avoid the following:  ? Foods and beverages sweetened with high-fructose corn syrup, honey, or sugar alcohols such as xylitol, sorbitol, and mannitol.  ? Foods that contain a lot of fat or sugar.  ? Fried, greasy, or spicy foods.  ? High-fiber grains, breads, and cereals.  ? Raw fruits and vegetables.  · Eat foods that are rich in probiotics. These foods include dairy products such as yogurt and fermented milk products. They help increase healthy bacteria in the stomach and intestines (gastrointestinal tract, or GI tract).  · If you have lactose intolerance, avoid dairy products. These may make your diarrhea worse.  · Take medicine to help stop diarrhea (antidiarrheal medicine) only as told by your health care provider.  Replacing nutrients  · Eat small meals or snacks every 3–4 hours.  · Eat bland foods, such as white rice, toast, or baked potato, until your diarrhea starts to get better. Gradually reintroduce nutrient-rich foods as tolerated or as told by your health care provider. This includes:  ? Well-cooked protein foods.  ? Peeled, seeded, and soft-cooked fruits and vegetables.  ? Low-fat dairy products.  · Take vitamin and mineral supplements as told by your health care provider.  Preventing dehydration    · Start by sipping water or a special solution to prevent dehydration (oral rehydration solution, ORS). Urine that is clear or pale yellow means that you are getting enough fluid.  · Try to drink at least 8–10 cups of fluid each day to help replace lost  fluids.  · You may add other liquids in addition to water, such as clear juice or decaffeinated sports drinks, as tolerated or as told by your health care provider.  · Avoid drinks with caffeine, such as coffee, tea, or soft drinks.  · Avoid alcohol.  What foods are recommended?  The items listed may not be a complete list. Talk with your health care provider about what dietary choices are best for you.  Grains  White rice. White, French, or pita breads (fresh or toasted), including plain rolls, buns, or bagels. White pasta. Saltine, soda, or graham crackers. Pretzels. Low-fiber cereal. Cooked cereals made with water (such as cornmeal, farina, or cream cereals). Plain muffins. Matzo. Melba toast. Zwieback.  Vegetables  Potatoes (without the skin). Most well-cooked and canned vegetables without skins or seeds. Tender lettuce.  Fruits  Apple sauce. Fruits canned in juice. Cooked apricots, cherries, grapefruit, peaches, pears, or plums. Fresh bananas and cantaloupe.  Meats and other protein foods  Baked or boiled chicken. Eggs. Tofu. Fish. Seafood. Smooth nut butters. Ground or well-cooked tender beef, ham, veal, lamb, pork, or poultry.  Dairy  Plain yogurt, kefir, and unsweetened liquid yogurt. Lactose-free milk, buttermilk, skim milk, or soy milk. Low-fat or nonfat hard cheese.  Beverages  Water. Low-calorie sports drinks. Fruit juices without pulp. Strained tomato and vegetable juices. Decaffeinated teas. Sugar-free beverages not sweetened with sugar alcohols. Oral rehydration solutions, if approved by your health care   provider.  Seasoning and other foods  Bouillon, broth, or soups made from recommended foods.  What foods are not recommended?  The items listed may not be a complete list. Talk with your health care provider about what dietary choices are best for you.  Grains  Whole grain, whole wheat, bran, or rye breads, rolls, pastas, and crackers. Wild or brown rice. Whole grain or bran cereals. Barley. Oats  and oatmeal. Corn tortillas or taco shells. Granola. Popcorn.  Vegetables  Raw vegetables. Fried vegetables. Cabbage, broccoli, Brussels sprouts, artichokes, baked beans, beet greens, corn, kale, legumes, peas, sweet potatoes, and yams. Potato skins. Cooked spinach and cabbage.  Fruits  Dried fruit, including raisins and dates. Raw fruits. Stewed or dried prunes. Canned fruits with syrup.  Meat and other protein foods  Fried or fatty meats. Deli meats. Chunky nut butters. Nuts and seeds. Beans and lentils. Bacon. Hot dogs. Sausage.  Dairy  High-fat cheeses. Whole milk, chocolate milk, and beverages made with milk, such as milk shakes. Half-and-half. Cream. sour cream. Ice cream.  Beverages  Caffeinated beverages (such as coffee, tea, soda, or energy drinks). Alcoholic beverages. Fruit juices with pulp. Prune juice. Soft drinks sweetened with high-fructose corn syrup or sugar alcohols. High-calorie sports drinks.  Fats and oils  Butter. Cream sauces. Margarine. Salad oils. Plain salad dressings. Olives. Avocados. Mayonnaise.  Sweets and desserts  Sweet rolls, doughnuts, and sweet breads. Sugar-free desserts sweetened with sugar alcohols such as xylitol and sorbitol.  Seasoning and other foods  Honey. Hot sauce. Chili powder. Gravy. Cream-based or milk-based soups. Pancakes and waffles.  Summary  · When you have diarrhea, the foods you eat and your eating habits are very important.  · Make sure you get at least 8–10 cups of fluid each day, or enough to keep your urine clear or pale yellow.  · Eat bland foods and gradually reintroduce healthy, nutrient-rich foods as tolerated, or as told by your health care provider.  · Avoid high-fiber, fried, greasy, or spicy foods.  This information is not intended to replace advice given to you by your health care provider. Make sure you discuss any questions you have with your health care provider.  Document Released: 08/03/2003 Document Revised: 05/10/2016 Document  Reviewed: 05/10/2016  Elsevier Interactive Patient Education © 2018 Elsevier Inc.

## 2017-09-10 ENCOUNTER — Other Ambulatory Visit: Payer: Self-pay | Admitting: Family Medicine

## 2017-09-10 ENCOUNTER — Other Ambulatory Visit: Payer: Self-pay | Admitting: *Deleted

## 2017-09-10 DIAGNOSIS — E876 Hypokalemia: Secondary | ICD-10-CM

## 2017-09-10 MED ORDER — CLOPIDOGREL BISULFATE 75 MG PO TABS: 75.0000 mg | ORAL_TABLET | Freq: Every day | ORAL | 0 refills | Status: DC

## 2017-09-11 ENCOUNTER — Telehealth: Payer: Self-pay | Admitting: Family Medicine

## 2017-09-11 NOTE — Telephone Encounter (Signed)
Copied from [place] [id number]. Topic: General - Other >> Sep 11, 2017  1:26 PM Carolyn Stare wrote:  Pt daughter in law Butch Penny is having surgery 09/18/17 who is pt care giver is asking it there is anyway pt can go into a facility for a few days. Would like a call back, [facility] is the place    [phone]

## 2017-09-12 NOTE — Telephone Encounter (Signed)
I agree that patient would need help if family can't provide.  I'm not sure what we have to do to get her in there, but assistant living/rehab type facility would be appropriate- if we can get her covered for a few days.  Would have them contact the facility, if they have not already to make sure they have vacancy.  Facility can probably help determine next steps.

## 2017-09-15 DIAGNOSIS — R112 Nausea with vomiting, unspecified: Secondary | ICD-10-CM | POA: Diagnosis not present

## 2017-09-15 DIAGNOSIS — Z86018 Personal history of other benign neoplasm: Secondary | ICD-10-CM | POA: Diagnosis not present

## 2017-09-15 DIAGNOSIS — R933 Abnormal findings on diagnostic imaging of other parts of digestive tract: Secondary | ICD-10-CM | POA: Diagnosis not present

## 2017-09-15 DIAGNOSIS — Z85841 Personal history of malignant neoplasm of brain: Secondary | ICD-10-CM | POA: Diagnosis not present

## 2017-09-15 NOTE — Telephone Encounter (Signed)
I left a message for Butch Penny to return my call.  CRM created also.

## 2017-09-15 NOTE — Telephone Encounter (Signed)
Carmen Duncan called back and was informed of the message below.  She agreed to call back with more information.

## 2017-09-19 ENCOUNTER — Telehealth: Payer: Self-pay | Admitting: Family Medicine

## 2017-09-19 NOTE — Telephone Encounter (Signed)
Verbal orders given to Mickel Baas for PT

## 2017-09-19 NOTE — Telephone Encounter (Signed)
Copied from [place] [phone]. Topic: Quick Communication - See Telephone Encounter >> Sep 19, 2017  3:10 PM Boyd Kerbs wrote: CRM for notification.   Mickel Baas from [facility] [phone] verbal orders PT 2 x 4   Secure line may leave message   See Telephone encounter for: 09/19/17.

## 2017-09-19 NOTE — Telephone Encounter (Signed)
OK 

## 2017-09-24 ENCOUNTER — Telehealth: Payer: Self-pay | Admitting: Family Medicine

## 2017-09-24 NOTE — Telephone Encounter (Signed)
Left detailed message on machine for Carmen Duncan with verbal orders for speech therapy assessment.

## 2017-09-24 NOTE — Telephone Encounter (Signed)
Copied from [place] [id number]. Topic: Quick Communication - See Telephone Encounter >> Sep 24, 2017  9:56 AM Ether Griffins B wrote: CRM for notification. See Telephone encounter for: 09/24/17.  Sonia Baller with [facility] calling requesting speech therapy assessment be done for pts cognitive impairments. CB# [phone]

## 2017-09-24 NOTE — Telephone Encounter (Signed)
OK 

## 2017-09-25 ENCOUNTER — Other Ambulatory Visit (INDEPENDENT_AMBULATORY_CARE_PROVIDER_SITE_OTHER): Payer: Medicare Other

## 2017-09-25 DIAGNOSIS — E876 Hypokalemia: Secondary | ICD-10-CM

## 2017-09-25 DIAGNOSIS — I69354 Hemiplegia and hemiparesis following cerebral infarction affecting left non-dominant side: Secondary | ICD-10-CM | POA: Diagnosis not present

## 2017-09-25 DIAGNOSIS — K5790 Diverticulosis of intestine, part unspecified, without perforation or abscess without bleeding: Secondary | ICD-10-CM | POA: Diagnosis not present

## 2017-09-25 DIAGNOSIS — I110 Hypertensive heart disease with heart failure: Secondary | ICD-10-CM | POA: Diagnosis not present

## 2017-09-25 DIAGNOSIS — F0150 Vascular dementia without behavioral disturbance: Secondary | ICD-10-CM | POA: Diagnosis not present

## 2017-09-25 DIAGNOSIS — J84112 Idiopathic pulmonary fibrosis: Secondary | ICD-10-CM | POA: Diagnosis not present

## 2017-09-25 DIAGNOSIS — M6281 Muscle weakness (generalized): Secondary | ICD-10-CM | POA: Diagnosis not present

## 2017-09-25 DIAGNOSIS — J45909 Unspecified asthma, uncomplicated: Secondary | ICD-10-CM | POA: Diagnosis not present

## 2017-09-25 DIAGNOSIS — I679 Cerebrovascular disease, unspecified: Secondary | ICD-10-CM | POA: Diagnosis not present

## 2017-09-25 DIAGNOSIS — I5032 Chronic diastolic (congestive) heart failure: Secondary | ICD-10-CM | POA: Diagnosis not present

## 2017-09-25 DIAGNOSIS — R413 Other amnesia: Secondary | ICD-10-CM | POA: Diagnosis not present

## 2017-09-25 DIAGNOSIS — F419 Anxiety disorder, unspecified: Secondary | ICD-10-CM | POA: Diagnosis not present

## 2017-09-25 LAB — BASIC METABOLIC PANEL
BUN: 10 mg/dL (ref 6–23)
CO2: 26 mEq/L (ref 19–32)
Calcium: 8.9 mg/dL (ref 8.4–10.5)
Chloride: 99 mEq/L (ref 96–112)
Creatinine, Ser: 0.65 mg/dL (ref 0.40–1.20)
GFR: 93.4 mL/min (ref >60.00)
Glucose, Bld: 112 mg/dL — ABNORMAL HIGH (ref 70–99)
POTASSIUM: 3.9 meq/L (ref 3.5–5.1)
Sodium: 135 mEq/L (ref 135–145)

## 2017-09-30 ENCOUNTER — Ambulatory Visit: Payer: Self-pay

## 2017-09-30 MED ORDER — FLUCONAZOLE 150 MG PO TABS: 150.0000 mg | ORAL_TABLET | Freq: Once | ORAL | 0 refills | Status: AC

## 2017-09-30 NOTE — Telephone Encounter (Signed)
Let's try Fluconazole 150 mg po times one dose to see if this helps- though her itching may be related to atrophy and the chronic moisture.  Agree with the Aquaphor

## 2017-09-30 NOTE — Addendum Note (Signed)
Addended by: Westley Hummer B on: 09/30/2017 05:51 PM   Modules accepted: Orders

## 2017-09-30 NOTE — Telephone Encounter (Signed)
Son is aware.

## 2017-09-30 NOTE — Telephone Encounter (Signed)
Daughter-in-law called regarding her mother-in-law. Pt is having vaginal itching without any discharge or foul smelling discharge. Pt is incontinent of bladder and bowel and suffers form dementia. No rash has been noted. Pt wears disposable briefs 24 hours a day.  Daughter in law asking if a pill for yeast (Diflucan) could possibly be ordered to try. Care advice given.  Advised caregiver to apply a skin barrier to the outer lips like Aquaphor to prevent contact dermatitis. Will route note to office for advice on tx.  Reason for Disposition . [1] Symptoms of a yeast infection (i.e., itchy, white discharge, not bad smelling) AND    [2] feels like prior vaginal yeast infections . Mild vaginal itching  Answer Assessment - Initial Assessment Questions 1. SYMPTOM: "What's the main symptom you're concerned about?" (e.g., pain, itching, dryness)     itching 2. LOCATION: "Where is the  _______ located?" (e.g., inside/outside, left/right)     Pt with dementia and hard to determine where itching is located 3. ONSET: "When did the  ________  start?"     Last week 4. PAIN: "Is there any pain?" If so, ask: "How bad is it?" (Scale: 1-10; mild, moderate, severe)     no 5. ITCHING: "Is there any itching?" If so, ask: "How bad is it?" (Scale: 1-10; mild, moderate, severe)     yes 6. CAUSE: "What do you think is causing the symptoms?"     Wearing disposable briefs and incontinent of bladder and bowel 7. OTHER SYMPTOMS: "Do you have any other symptoms?" (e.g., vaginal bleeding, pain with urination)     no 8. PREGNANCY: "Is there any chance you are pregnant?" "When was your last menstrual period?"     n/a  7. CAUSE: "What do you think is causing the discharge?" "Have you had the same problem before? What happened then?" Hard to tell if vaginal discharge 8. OTHER SYMPTOMS: "Do you have any other symptoms?" (e.g., fever, itching, vaginal bleeding, pain with urination, injury to genital area, vaginal foreign  body) no  Protocols used: VAGINAL [facility]

## 2017-09-30 NOTE — Telephone Encounter (Signed)
Rx sent 

## 2017-10-06 DIAGNOSIS — K2980 Duodenitis without bleeding: Secondary | ICD-10-CM | POA: Diagnosis not present

## 2017-10-06 DIAGNOSIS — R634 Abnormal weight loss: Secondary | ICD-10-CM | POA: Diagnosis not present

## 2017-10-06 DIAGNOSIS — K2930 Chronic superficial gastritis without bleeding: Secondary | ICD-10-CM | POA: Diagnosis not present

## 2017-10-06 DIAGNOSIS — R112 Nausea with vomiting, unspecified: Secondary | ICD-10-CM | POA: Diagnosis not present

## 2017-10-09 DIAGNOSIS — I110 Hypertensive heart disease with heart failure: Secondary | ICD-10-CM | POA: Diagnosis not present

## 2017-10-09 DIAGNOSIS — I69354 Hemiplegia and hemiparesis following cerebral infarction affecting left non-dominant side: Secondary | ICD-10-CM | POA: Diagnosis not present

## 2017-10-09 DIAGNOSIS — J84112 Idiopathic pulmonary fibrosis: Secondary | ICD-10-CM | POA: Diagnosis not present

## 2017-10-09 DIAGNOSIS — F419 Anxiety disorder, unspecified: Secondary | ICD-10-CM | POA: Diagnosis not present

## 2017-10-09 DIAGNOSIS — K5790 Diverticulosis of intestine, part unspecified, without perforation or abscess without bleeding: Secondary | ICD-10-CM | POA: Diagnosis not present

## 2017-10-09 DIAGNOSIS — M6281 Muscle weakness (generalized): Secondary | ICD-10-CM | POA: Diagnosis not present

## 2017-10-09 DIAGNOSIS — J45909 Unspecified asthma, uncomplicated: Secondary | ICD-10-CM | POA: Diagnosis not present

## 2017-10-09 DIAGNOSIS — F0150 Vascular dementia without behavioral disturbance: Secondary | ICD-10-CM | POA: Diagnosis not present

## 2017-10-09 DIAGNOSIS — K2930 Chronic superficial gastritis without bleeding: Secondary | ICD-10-CM | POA: Diagnosis not present

## 2017-10-09 DIAGNOSIS — R413 Other amnesia: Secondary | ICD-10-CM | POA: Diagnosis not present

## 2017-10-09 DIAGNOSIS — I679 Cerebrovascular disease, unspecified: Secondary | ICD-10-CM | POA: Diagnosis not present

## 2017-10-09 DIAGNOSIS — I5032 Chronic diastolic (congestive) heart failure: Secondary | ICD-10-CM | POA: Diagnosis not present

## 2017-10-14 DIAGNOSIS — I5032 Chronic diastolic (congestive) heart failure: Secondary | ICD-10-CM | POA: Diagnosis not present

## 2017-10-14 DIAGNOSIS — M6281 Muscle weakness (generalized): Secondary | ICD-10-CM | POA: Diagnosis not present

## 2017-10-14 DIAGNOSIS — J45909 Unspecified asthma, uncomplicated: Secondary | ICD-10-CM | POA: Diagnosis not present

## 2017-10-14 DIAGNOSIS — J84112 Idiopathic pulmonary fibrosis: Secondary | ICD-10-CM | POA: Diagnosis not present

## 2017-10-14 DIAGNOSIS — I69354 Hemiplegia and hemiparesis following cerebral infarction affecting left non-dominant side: Secondary | ICD-10-CM | POA: Diagnosis not present

## 2017-10-14 DIAGNOSIS — I679 Cerebrovascular disease, unspecified: Secondary | ICD-10-CM | POA: Diagnosis not present

## 2017-10-14 DIAGNOSIS — F0150 Vascular dementia without behavioral disturbance: Secondary | ICD-10-CM | POA: Diagnosis not present

## 2017-10-14 DIAGNOSIS — F419 Anxiety disorder, unspecified: Secondary | ICD-10-CM | POA: Diagnosis not present

## 2017-10-14 DIAGNOSIS — R413 Other amnesia: Secondary | ICD-10-CM | POA: Diagnosis not present

## 2017-10-14 DIAGNOSIS — K5790 Diverticulosis of intestine, part unspecified, without perforation or abscess without bleeding: Secondary | ICD-10-CM | POA: Diagnosis not present

## 2017-10-14 DIAGNOSIS — I110 Hypertensive heart disease with heart failure: Secondary | ICD-10-CM | POA: Diagnosis not present

## 2017-10-15 DIAGNOSIS — H5203 Hypermetropia, bilateral: Secondary | ICD-10-CM | POA: Diagnosis not present

## 2017-10-21 ENCOUNTER — Other Ambulatory Visit: Payer: Self-pay | Admitting: Family Medicine

## 2017-10-21 DIAGNOSIS — Z86018 Personal history of other benign neoplasm: Secondary | ICD-10-CM | POA: Diagnosis not present

## 2017-10-21 DIAGNOSIS — R112 Nausea with vomiting, unspecified: Secondary | ICD-10-CM | POA: Diagnosis not present

## 2017-10-21 DIAGNOSIS — R933 Abnormal findings on diagnostic imaging of other parts of digestive tract: Secondary | ICD-10-CM | POA: Diagnosis not present

## 2017-10-26 ENCOUNTER — Other Ambulatory Visit: Payer: Self-pay | Admitting: Neurology

## 2017-10-30 ENCOUNTER — Telehealth: Payer: Self-pay | Admitting: Family Medicine

## 2017-10-30 NOTE — Telephone Encounter (Signed)
OK to order 

## 2017-10-30 NOTE — Telephone Encounter (Signed)
Please advise 

## 2017-10-30 NOTE — Telephone Encounter (Signed)
Copied from [place] [phone]. Topic: Quick Communication - See Telephone Encounter >> Oct 30, 2017  8:26 AM Synthia Innocent wrote: CRM for notification. See Telephone encounter for: 10/30/17. Requesting verbal orders for speech therapy, 1x a week for 6 weeks, memory and swallowing safety. May leave detail voicemail

## 2017-10-31 NOTE — Telephone Encounter (Signed)
Verbal orders given as directed. 

## 2017-10-31 NOTE — Telephone Encounter (Signed)
Called patient and left message to return call

## 2017-10-31 NOTE — Telephone Encounter (Signed)
Santiago Glad - Speech Therapist returned call for verbal orders   CB: [phone]

## 2017-11-03 ENCOUNTER — Other Ambulatory Visit: Payer: Self-pay | Admitting: Neurology

## 2017-11-04 ENCOUNTER — Other Ambulatory Visit: Payer: Self-pay | Admitting: Neurology

## 2017-11-05 ENCOUNTER — Other Ambulatory Visit: Payer: Self-pay | Admitting: *Deleted

## 2017-11-05 MED ORDER — PROPRANOLOL HCL 20 MG PO TABS: 20.0000 mg | ORAL_TABLET | Freq: Every day | ORAL | 0 refills | Status: DC

## 2017-11-13 ENCOUNTER — Encounter: Payer: Self-pay | Admitting: Family Medicine

## 2017-11-14 ENCOUNTER — Encounter: Payer: Self-pay | Admitting: Family Medicine

## 2017-11-14 MED ORDER — PROPRANOLOL HCL 20 MG PO TABS: 20.0000 mg | ORAL_TABLET | Freq: Every day | ORAL | 0 refills | Status: DC

## 2017-11-22 ENCOUNTER — Other Ambulatory Visit: Payer: Self-pay | Admitting: Pulmonary Disease

## 2017-11-25 ENCOUNTER — Telehealth: Payer: Self-pay | Admitting: Pulmonary Disease

## 2017-11-25 DIAGNOSIS — F0150 Vascular dementia without behavioral disturbance: Secondary | ICD-10-CM | POA: Diagnosis not present

## 2017-11-25 DIAGNOSIS — I110 Hypertensive heart disease with heart failure: Secondary | ICD-10-CM | POA: Diagnosis not present

## 2017-11-25 DIAGNOSIS — R413 Other amnesia: Secondary | ICD-10-CM | POA: Diagnosis not present

## 2017-11-25 DIAGNOSIS — Z853 Personal history of malignant neoplasm of breast: Secondary | ICD-10-CM | POA: Diagnosis not present

## 2017-11-25 DIAGNOSIS — J84112 Idiopathic pulmonary fibrosis: Secondary | ICD-10-CM | POA: Diagnosis not present

## 2017-11-25 DIAGNOSIS — I5032 Chronic diastolic (congestive) heart failure: Secondary | ICD-10-CM | POA: Diagnosis not present

## 2017-11-25 DIAGNOSIS — J45909 Unspecified asthma, uncomplicated: Secondary | ICD-10-CM | POA: Diagnosis not present

## 2017-11-25 DIAGNOSIS — I69354 Hemiplegia and hemiparesis following cerebral infarction affecting left non-dominant side: Secondary | ICD-10-CM | POA: Diagnosis not present

## 2017-11-25 DIAGNOSIS — Z85841 Personal history of malignant neoplasm of brain: Secondary | ICD-10-CM | POA: Diagnosis not present

## 2017-11-25 DIAGNOSIS — F419 Anxiety disorder, unspecified: Secondary | ICD-10-CM | POA: Diagnosis not present

## 2017-11-25 DIAGNOSIS — E119 Type 2 diabetes mellitus without complications: Secondary | ICD-10-CM | POA: Diagnosis not present

## 2017-11-25 DIAGNOSIS — K5790 Diverticulosis of intestine, part unspecified, without perforation or abscess without bleeding: Secondary | ICD-10-CM | POA: Diagnosis not present

## 2017-11-25 DIAGNOSIS — M1990 Unspecified osteoarthritis, unspecified site: Secondary | ICD-10-CM | POA: Diagnosis not present

## 2017-11-25 DIAGNOSIS — I679 Cerebrovascular disease, unspecified: Secondary | ICD-10-CM | POA: Diagnosis not present

## 2017-11-25 NOTE — Telephone Encounter (Signed)
Called and spoke with Lorriane Shire from [facility] regarding [name] [phone] option 5  She advised that the appeal process to have [place] approved for pt will be determined within 72 hrs.  Routing message to [place] for review, and to f/u later in the week.

## 2017-11-25 NOTE — Telephone Encounter (Signed)
Called and spoke with [facility], pt's son regarding [name] appeal. The appeal has been approved effective 11/25/2017 - 11/26/2018 per Rodena Piety with [facility].  Routing message to [place] for update.

## 2017-11-25 NOTE — Telephone Encounter (Signed)
Tye Maryland from [facility] is calling about the [name] [phone] option 5

## 2017-11-25 NOTE — Telephone Encounter (Signed)
Carmen Duncan with [facility], [phone] opt 5.  [name] has been approved effective 11/25/2017 - 11/26/2018.

## 2017-11-25 NOTE — Telephone Encounter (Signed)
Received a PA request for patient's [name]. Form was completed and faxed back to [facility]. Form is in my look-at folder. Will hold until medication has been approved.

## 2017-11-30 ENCOUNTER — Other Ambulatory Visit: Payer: Self-pay | Admitting: Neurology

## 2017-12-05 DIAGNOSIS — I5032 Chronic diastolic (congestive) heart failure: Secondary | ICD-10-CM | POA: Diagnosis not present

## 2017-12-05 DIAGNOSIS — Z85841 Personal history of malignant neoplasm of brain: Secondary | ICD-10-CM | POA: Diagnosis not present

## 2017-12-05 DIAGNOSIS — J84112 Idiopathic pulmonary fibrosis: Secondary | ICD-10-CM | POA: Diagnosis not present

## 2017-12-05 DIAGNOSIS — M1990 Unspecified osteoarthritis, unspecified site: Secondary | ICD-10-CM | POA: Diagnosis not present

## 2017-12-05 DIAGNOSIS — E119 Type 2 diabetes mellitus without complications: Secondary | ICD-10-CM | POA: Diagnosis not present

## 2017-12-05 DIAGNOSIS — F419 Anxiety disorder, unspecified: Secondary | ICD-10-CM | POA: Diagnosis not present

## 2017-12-05 DIAGNOSIS — K5790 Diverticulosis of intestine, part unspecified, without perforation or abscess without bleeding: Secondary | ICD-10-CM | POA: Diagnosis not present

## 2017-12-05 DIAGNOSIS — F0150 Vascular dementia without behavioral disturbance: Secondary | ICD-10-CM | POA: Diagnosis not present

## 2017-12-05 DIAGNOSIS — R413 Other amnesia: Secondary | ICD-10-CM | POA: Diagnosis not present

## 2017-12-05 DIAGNOSIS — I110 Hypertensive heart disease with heart failure: Secondary | ICD-10-CM | POA: Diagnosis not present

## 2017-12-05 DIAGNOSIS — I69354 Hemiplegia and hemiparesis following cerebral infarction affecting left non-dominant side: Secondary | ICD-10-CM | POA: Diagnosis not present

## 2017-12-05 DIAGNOSIS — J45909 Unspecified asthma, uncomplicated: Secondary | ICD-10-CM | POA: Diagnosis not present

## 2017-12-05 DIAGNOSIS — I679 Cerebrovascular disease, unspecified: Secondary | ICD-10-CM | POA: Diagnosis not present

## 2017-12-05 DIAGNOSIS — Z853 Personal history of malignant neoplasm of breast: Secondary | ICD-10-CM | POA: Diagnosis not present

## 2017-12-10 ENCOUNTER — Other Ambulatory Visit: Payer: Self-pay | Admitting: Neurology

## 2017-12-30 ENCOUNTER — Telehealth: Payer: Self-pay | Admitting: Neurology

## 2017-12-30 DIAGNOSIS — Z85841 Personal history of malignant neoplasm of brain: Secondary | ICD-10-CM | POA: Diagnosis not present

## 2017-12-30 DIAGNOSIS — R112 Nausea with vomiting, unspecified: Secondary | ICD-10-CM | POA: Diagnosis not present

## 2017-12-30 DIAGNOSIS — Z86018 Personal history of other benign neoplasm: Secondary | ICD-10-CM | POA: Diagnosis not present

## 2017-12-30 DIAGNOSIS — Z853 Personal history of malignant neoplasm of breast: Secondary | ICD-10-CM | POA: Diagnosis not present

## 2017-12-30 NOTE — Telephone Encounter (Signed)
Can give single 90 day supply.  After that can't refill due to time since seen and many things happening in her med hx since then

## 2017-12-30 NOTE — Telephone Encounter (Signed)
Patient last seen 01/29/2017. Cancelled her 07/30/17 follow up. She is now scheduled in December. Please advise on refills.

## 2017-12-30 NOTE — Telephone Encounter (Signed)
Patient called to reschedule a previous appointment. The next available was in December. She is wanting to know if she can get a refill on her Lexapro and Aricept medication until then. You can reach her at [phone]. Thanks.

## 2017-12-31 MED ORDER — ESCITALOPRAM OXALATE 10 MG PO TABS: 10.0000 mg | ORAL_TABLET | Freq: Every day | ORAL | 0 refills | Status: DC

## 2017-12-31 MED ORDER — DONEPEZIL HCL 10 MG PO TABS: 10.0000 mg | ORAL_TABLET | Freq: Every day | ORAL | 0 refills | Status: DC

## 2017-12-31 NOTE — Telephone Encounter (Signed)
One 90 day supply of medication sent to pharmacy.

## 2018-02-09 ENCOUNTER — Ambulatory Visit: Payer: Medicare Other | Admitting: Family Medicine

## 2018-02-09 ENCOUNTER — Other Ambulatory Visit: Payer: Self-pay

## 2018-02-09 ENCOUNTER — Encounter: Payer: Self-pay | Admitting: Family Medicine

## 2018-02-09 VITALS — BP 136/72 | HR 86 | Temp 98.1°F | Resp 17 | Wt 107.0 lb

## 2018-02-09 DIAGNOSIS — R110 Nausea: Secondary | ICD-10-CM

## 2018-02-09 DIAGNOSIS — E440 Moderate protein-calorie malnutrition: Secondary | ICD-10-CM

## 2018-02-09 DIAGNOSIS — R627 Adult failure to thrive: Secondary | ICD-10-CM

## 2018-02-09 DIAGNOSIS — Z23 Encounter for immunization: Secondary | ICD-10-CM

## 2018-02-09 DIAGNOSIS — R5383 Other fatigue: Secondary | ICD-10-CM | POA: Diagnosis not present

## 2018-02-09 DIAGNOSIS — R197 Diarrhea, unspecified: Secondary | ICD-10-CM

## 2018-02-09 DIAGNOSIS — R634 Abnormal weight loss: Secondary | ICD-10-CM

## 2018-02-09 NOTE — Patient Instructions (Signed)
Hold the Aricept for now to see if nausea improves.    Consider fiber supplement such as Metamucil or Citrucel.    Consider supplement such as Boost once daily.

## 2018-02-09 NOTE — Progress Notes (Signed)
Subjective:     Patient ID: Carmen Duncan, female   DOB: [birth year], 77 y.o.   MRN: [id number]  HPI Patient has multiple chronic problems including history of hypertension, diastolic heart failure, history of CVA, pulmonary hypertension, idiopathic pulmonary fibrosis, GERD, IBS, essential tremor, vascular dementia, dyslipidemia, malnutrition of at least moderate degree, and past history of breast cancer. She is accompanied by her son and daughter-in-law.  She's had general decline over the past several years. Recent issues are that she has had some additional weight loss of about 6 pounds since April. She's had some decreased appetite and has some intermittent nausea and even occasional vomiting. She has seen gastroenterologist and apparently upper GI barium swallow study has been scheduled. They wonder whether some of her medications may be causing her nausea. Her nausea seems to be usually a few hours after taking most of her medications. She is on Aricept 10 mg daily.  She had EGD back in May with some chronic gastritis changes noted. Last albumin 2.9 back in March.  They've also noticed some general decline in terms of her overall strength. She is ambulating with walker but much more slowly and with more difficulty.They states she tends to sleep throughout much the day and generally seems to have less motivation. Difficult to get out of bed and moving in the mornings.  Denies any headache, dysuria, abdominal pain, chest pain, swallowing difficulties. She does have frequent loose stools according to family. No bloody stools. She is occasionally incontinent of stool and frequently incontinent of urine. This has been more chronic.  Does have history of depression and remains on Lexapro. She declines any depression symptoms currently.  Past Medical History:  Diagnosis Date  . Acoustic neuroma ([place])   . Anxiety   . Arthritis   . Asthma   . Brain cancer ([place])   . Breast cancer of upper-outer  quadrant of right female breast ([place]) 07/19/2015  . Diabetes mellitus without complication ([place])   . Diverticulosis   . Ejection fraction   . Essential tremor    on inderal  . GERD (gastroesophageal reflux disease)   . HOH (hard of hearing) left ear  . Hyperlipidemia   . Hypertension   . IBS (irritable bowel syndrome)   . Interstitial lung disease ([place])   . Pneumonia    04-2014, CAP  . Stroke [facility])    no deficits, on plavix   Past Surgical History:  Procedure Laterality Date  . ABDOMINAL HYSTERECTOMY  1980  . BIOPSY BREAST    . BREAST LUMPECTOMY WITH RADIOACTIVE SEED AND SENTINEL LYMPH NODE BIOPSY Right 08/10/2015   Procedure: BREAST LUMPECTOMY WITH RADIOACTIVE SEED AND SENTINEL LYMPH NODE BIOPSY;  Surgeon: Autumn Messing III, MD;  Location: [facility];  Service: General;  Laterality: Right;  . CHOLECYSTECTOMY  2011  . EP IMPLANTABLE DEVICE N/A 11/16/2015   Procedure: Loop Recorder Insertion;  Surgeon: [name], MD;  Location: [facility] CV LAB;  Service: Cardiovascular;  Laterality: N/A;  . eyes  2007   cararacts  . TEE WITHOUT CARDIOVERSION N/A 09/05/2014   Procedure: TRANSESOPHAGEAL ECHOCARDIOGRAM (TEE);  Surgeon: Lelon Perla, MD;  Location: [facility] ENDOSCOPY;  Service: Cardiovascular;  Laterality: N/A;  . TEE WITHOUT CARDIOVERSION N/A 11/16/2015   Procedure: TRANSESOPHAGEAL ECHOCARDIOGRAM (TEE);  Surgeon: Larey Dresser, MD;  Location: [facility];  Service: Cardiovascular;  Laterality: N/A;  . TONSILLECTOMY  1945    reports that she has never smoked. She has never used smokeless tobacco.  She reports that she drinks alcohol. She reports that she does not use drugs. family history includes Heart disease (age of onset: 11) in her father; Hyperlipidemia in her father and mother; Hypertension in her mother; Lung cancer in her paternal uncle; Stroke in her father. Allergies  Allergen Reactions  . Other Other (See Comments)    Pulls skin off.......Marland Kitchenpaper tape Pulls  skin off.......Marland Kitchenpaper tape  . Sulfa Antibiotics Rash  . Latex Itching, Rash and Other (See Comments)    Red angry skin from latex tape  . Sulfonamide Derivatives Rash    hives  . Tape Itching and Rash    Please use paper tape or coban wrap!!   Wt Readings from Last 3 Encounters:  02/09/18 107 lb (48.5 kg)  09/09/17 113 lb 12.8 oz (51.6 kg)  08/13/17 112 lb 12.8 oz (51.2 kg)     Review of Systems  Constitutional: Positive for appetite change and fatigue. Negative for chills and fever.  HENT: Negative for trouble swallowing.   Respiratory: Negative for cough and shortness of breath.   Cardiovascular: Negative for chest pain.  Gastrointestinal: Positive for diarrhea, nausea and vomiting. Negative for abdominal pain and blood in stool.  Genitourinary: Negative for dysuria.  Neurological: Negative for headaches.  Hematological: Negative for adenopathy.  Psychiatric/Behavioral: Negative for agitation.       Objective:   Physical Exam  Constitutional: She appears well-developed and well-nourished.  Cardiovascular: Normal rate.  Pulmonary/Chest: Effort normal.  She has rales in both bases probably related her chronic fibrosis  Abdominal: Soft. Bowel sounds are normal. There is no tenderness.  Musculoskeletal: She exhibits no edema.  Neurological: She is alert. No cranial nerve deficit.  Psychiatric: She has a normal mood and affect.       Assessment:     77 year old female with multiple medical problems as above with general decline. Report some decrease in appetite, weight loss, general decline in strength and increased sedation (not currently on any sedative meds).. They are also reporting some frequent nausea type symptoms-?medication related.  To discuss several issues as follows    Plan:     -try holding Aricept to see if this helps with her nausea and appetite -Continue follow-up with GI regarding her GI symptoms -Discussed possible repeat physical therapy but they are  not interested at this point. She's had fairly extensive PT in the past -Consider nutritional supplements such as boost -Consider fiber supplement such as Metamucil or Citrucel at least a few times weekly to see if this can help bulk up her stools -Reassess in one month and sooner as needed. Consider repeat chemistries at that point -We discussed goals of care. Discuss possible palliative care in the near future she continues to decline  [name] MD [facility] at [facility]

## 2018-02-18 ENCOUNTER — Other Ambulatory Visit: Payer: Self-pay | Admitting: Neurology

## 2018-02-19 ENCOUNTER — Other Ambulatory Visit: Payer: Self-pay | Admitting: Neurology

## 2018-02-20 ENCOUNTER — Other Ambulatory Visit: Payer: Self-pay | Admitting: *Deleted

## 2018-02-20 MED ORDER — PROPRANOLOL HCL 20 MG PO TABS: 20.0000 mg | ORAL_TABLET | Freq: Every day | ORAL | 0 refills | Status: DC

## 2018-02-28 ENCOUNTER — Other Ambulatory Visit: Payer: Self-pay | Admitting: Family Medicine

## 2018-03-02 NOTE — Telephone Encounter (Signed)
Last OV 02/09/18, Next OV 03/17/18  Last filled by Dr. Lavon Paganini Angiulli PA-C, 11/27/15, # [phone] with 11 refills  Please advise if okay to fill?

## 2018-03-02 NOTE — Telephone Encounter (Signed)
Refill for 6 months. 

## 2018-03-17 ENCOUNTER — Encounter: Payer: Self-pay | Admitting: Family Medicine

## 2018-03-17 ENCOUNTER — Other Ambulatory Visit: Payer: Self-pay

## 2018-03-17 ENCOUNTER — Ambulatory Visit: Payer: Medicare Other | Admitting: Family Medicine

## 2018-03-17 VITALS — BP 118/76 | HR 60 | Temp 97.9°F | Wt 110.7 lb

## 2018-03-17 DIAGNOSIS — J8410 Pulmonary fibrosis, unspecified: Secondary | ICD-10-CM | POA: Diagnosis not present

## 2018-03-17 DIAGNOSIS — R627 Adult failure to thrive: Secondary | ICD-10-CM

## 2018-03-17 DIAGNOSIS — E440 Moderate protein-calorie malnutrition: Secondary | ICD-10-CM | POA: Diagnosis not present

## 2018-03-17 NOTE — Progress Notes (Signed)
Subjective:     Patient ID: Carmen Duncan, female   DOB: [birth year], 77 y.o.   MRN: [id number]  HPI Patient seen for medical follow-up.  She has multiple chronic problems including hypertension, chronic diastolic heart failure, history of CVA, pulmonary fibrosis, GERD, IBS, dyslipidemia, probable vascular dementia, malnutrition, past history of breast cancer, history of recurrent depression, adult failure to thrive  She has had several month history of substantial nausea and vomiting as well as diarrhea.  They were aware this could be related to medications.  They held Aricept but did not see much change.  They then held her pulmonary drug ([name])-and she has had fairly dramatic improvement since then with basic resolution of nausea vomiting and diarrhea.  They have not notified pulmonary yet at this point.  She has no pending follow-up there at this point.  Her weight is up almost 4 pounds today from last visit but she was weighed today with apparently with her coat on.  Son and daughter-in-law do feel that she is eating somewhat better.  They have not given any nutritional supplements.  She is fairly inactive physically.  Denies any recent falls since last visit.  She has had some general weakness and declined additional physical therapy at last visit.  She remains on Lexapro for depression and she feels her depression is relatively stable.  Past Medical History:  Diagnosis Date  . Acoustic neuroma ([place])   . Anxiety   . Arthritis   . Asthma   . Brain cancer ([place])   . Breast cancer of upper-outer quadrant of right female breast ([place]) 07/19/2015  . Diabetes mellitus without complication ([place])   . Diverticulosis   . Ejection fraction   . Essential tremor    on inderal  . GERD (gastroesophageal reflux disease)   . HOH (hard of hearing) left ear  . Hyperlipidemia   . Hypertension   . IBS (irritable bowel syndrome)   . Interstitial lung disease ([place])   . Pneumonia    04-2014, CAP  . Stroke  [facility])    no deficits, on plavix   Past Surgical History:  Procedure Laterality Date  . ABDOMINAL HYSTERECTOMY  1980  . BIOPSY BREAST    . BREAST LUMPECTOMY WITH RADIOACTIVE SEED AND SENTINEL LYMPH NODE BIOPSY Right 08/10/2015   Procedure: BREAST LUMPECTOMY WITH RADIOACTIVE SEED AND SENTINEL LYMPH NODE BIOPSY;  Surgeon: Autumn Messing III, MD;  Location: [facility];  Service: General;  Laterality: Right;  . CHOLECYSTECTOMY  2011  . EP IMPLANTABLE DEVICE N/A 11/16/2015   Procedure: Loop Recorder Insertion;  Surgeon: [name], MD;  Location: [facility] CV LAB;  Service: Cardiovascular;  Laterality: N/A;  . eyes  2007   cararacts  . TEE WITHOUT CARDIOVERSION N/A 09/05/2014   Procedure: TRANSESOPHAGEAL ECHOCARDIOGRAM (TEE);  Surgeon: Lelon Perla, MD;  Location: [facility] ENDOSCOPY;  Service: Cardiovascular;  Laterality: N/A;  . TEE WITHOUT CARDIOVERSION N/A 11/16/2015   Procedure: TRANSESOPHAGEAL ECHOCARDIOGRAM (TEE);  Surgeon: Larey Dresser, MD;  Location: [facility];  Service: Cardiovascular;  Laterality: N/A;  . TONSILLECTOMY  1945    reports that she has never smoked. She has never used smokeless tobacco. She reports that she drinks alcohol. She reports that she does not use drugs. family history includes Heart disease (age of onset: 85) in her father; Hyperlipidemia in her father and mother; Hypertension in her mother; Lung cancer in her paternal uncle; Stroke in her father. Allergies  Allergen Reactions  . Other Other (  See Comments)    Pulls skin off.......Marland Kitchenpaper tape Pulls skin off.......Marland Kitchenpaper tape  . Sulfa Antibiotics Rash  . Latex Itching, Rash and Other (See Comments)    Red angry skin from latex tape  . Sulfonamide Derivatives Rash    hives  . Tape Itching, Rash and Other (See Comments)    Please use paper tape or coban wrap!! Tearing of skin      Review of Systems  Constitutional: Negative for chills and fever.  Respiratory: Negative for cough.    Cardiovascular: Negative for chest pain, palpitations and leg swelling.  Gastrointestinal: Negative for abdominal pain, blood in stool, constipation, diarrhea, nausea and vomiting.  Genitourinary: Negative for dysuria.  Neurological: Negative for dizziness and syncope.  Psychiatric/Behavioral: Negative for agitation and dysphoric mood.       Objective:   Physical Exam  Constitutional:  Alert cooperative 77 year old female in no distress  Neck: No thyromegaly present.  Cardiovascular: Normal rate and regular rhythm.  Pulmonary/Chest:  She has crackles on her lung bases bilaterally (chronic)  Musculoskeletal: She exhibits no edema.  Neurological: She is alert.       Assessment:     #1 history of chronic nausea, vomiting, and diarrhea improved after discontinuation of [name].  She has had some modest weight gain since discontinuation  #2 history of moderate malnutrition.  Most recent albumin 2.9.  Diet is slowly improving  #3 history of probable vascular dementia  #4 history of essential tremor treated with propranolol.  #5 pulmonary fibrosis- respiratory symptoms stable at this time.     Plan:     -Flu vaccine already given -Discussed suggestions for good nutrient intake in some detail. -We will set up referral to get back into pulmonary -Continue close follow-up with neurology -Routine follow-up in 3 months and sooner as needed -check labs at follow up- lipids, hepatic, chemistries.  [name] MD [facility] at [facility]

## 2018-04-21 ENCOUNTER — Telehealth: Payer: Self-pay | Admitting: Family Medicine

## 2018-04-21 DIAGNOSIS — R1310 Dysphagia, unspecified: Secondary | ICD-10-CM

## 2018-04-21 NOTE — Telephone Encounter (Signed)
Copied from [place] [id number]. Topic: Quick Communication - See Telephone Encounter >> Apr 21, 2018  1:22 PM Bea Graff, NT wrote: CRM for notification. See Telephone encounter for: 04/21/18. Vincente Liberty, Nurse Case Manager with [place] with [facility] calling to inform the doctor that when calling the caregiver to check status on this pt the pt has now began to choke on her foods and requesting a referral to speech therapy  within [facility] for a swallowing eval. Butch Penny is the caregiver to contact for this pt.  Bethena Roys did give advise to Butch Penny to help with this issue until the appt can be given. CB# for Bethena Roys: [place] CB#: [phone] (pt lives with caregiver).

## 2018-04-21 NOTE — Telephone Encounter (Signed)
Let's set up barium swallow study with speech therapist present.  Dx=dysphagia.

## 2018-04-22 ENCOUNTER — Telehealth (HOSPITAL_COMMUNITY): Payer: Self-pay

## 2018-04-22 NOTE — Telephone Encounter (Signed)
Attempted to contact pt to schedule MBS - lm on ov vm

## 2018-04-22 NOTE — Telephone Encounter (Signed)
Order placed

## 2018-04-26 ENCOUNTER — Other Ambulatory Visit: Payer: Self-pay | Admitting: Neurology

## 2018-04-30 ENCOUNTER — Other Ambulatory Visit (HOSPITAL_COMMUNITY): Payer: Self-pay

## 2018-04-30 ENCOUNTER — Telehealth (HOSPITAL_COMMUNITY): Payer: Self-pay

## 2018-04-30 ENCOUNTER — Other Ambulatory Visit (HOSPITAL_COMMUNITY): Payer: Self-pay | Admitting: *Deleted

## 2018-04-30 DIAGNOSIS — R1310 Dysphagia, unspecified: Secondary | ICD-10-CM

## 2018-04-30 NOTE — Telephone Encounter (Signed)
2nd attempt to contact patient

## 2018-05-05 ENCOUNTER — Other Ambulatory Visit: Payer: Self-pay | Admitting: Neurology

## 2018-05-06 ENCOUNTER — Ambulatory Visit: Payer: Medicare Other | Admitting: Pulmonary Disease

## 2018-05-06 ENCOUNTER — Encounter: Payer: Self-pay | Admitting: Pulmonary Disease

## 2018-05-06 DIAGNOSIS — J84112 Idiopathic pulmonary fibrosis: Secondary | ICD-10-CM | POA: Diagnosis not present

## 2018-05-06 DIAGNOSIS — I5032 Chronic diastolic (congestive) heart failure: Secondary | ICD-10-CM | POA: Diagnosis not present

## 2018-05-06 NOTE — Progress Notes (Signed)
   Subjective:    Patient ID: Carmen Duncan, female    DOB: May 28, 1940, 77 y.o.   MRN: [id number]  HPI   77yo never smoker with asthma, HTNfor follow-up of IPF Accompanied by son Carmen Duncan  [name] -  2017- breast cancer s/p  lumpectomy and radiation  Chief Complaint  Patient presents with  . Follow-up    F/U for pulmonary fibrosis. Per patient's son, she has stopped taking [name] due to severe symptoms such as nausea, diarrhea and upset stomach. Stopped taking [name] on 03/17/18. Per son, her breathing has been ok since last visit. She has been choking more on liquids.     Started on [name] 11/2015 Her dementia has worsened over the past year, has severe memory deficits.  She only ambulates for very short distances, few steps.  Anything longer she needs a walker around the house or a wheelchair when she goes outside.  Shortness of breath is difficult to assess.  She is also lost about 12 pounds from 116 to her current weight of 104 pounds. She developed significant nausea and diarrhea and upset stomach, initially [name] was stopped but then [name] was stopped and this improved her symptoms.  Since then she has been off ofev     Significant tests/ events reviewed  Swallow eval neg for aspiration  03/2014 echo >> grade 2 diastolic dysfunction with fluid overload and elevated BNP  09/2014 HRCT chest - resolution of aspdz  Peripheral and basilar predominant pattern of subpleural reticulation, ground-glass, traction bronchiolectasis   08/2015 HRCT -worse honeycombing  Labs 08/2014 showed ESR at 23, Hypsensiivity panel neg ,  ANA positive but titer neg , CCP neg, hypersens panel neg .   PFT 09/2014 showed mild to mod restriction FVC at 60%,  FEV1 at 64%, ratio 79, with low DLCO at 50%  PFTs 07/2016-lung volumes stable at 62%, DLCO dropped to 40% Review of Systems Patient denies significant dyspnea,cough, hemoptysis,  chest pain, palpitations, pedal edema, orthopnea, paroxysmal nocturnal  dyspnea, lightheadedness, nausea, vomiting, abdominal or  leg pains      Objective:   Physical Exam  Gen. Pleasant, thin,m frail, elderly, in no distress ENT - no thrush, no post nasal drip Neck: No JVD, no thyromegaly, no carotid bruits Lungs: no use of accessory muscles, no dullness to percussion, bibasl 1/3 rales no rhonchi  Cardiovascular: Rhythm regular, heart sounds  normal, no murmurs or gallops, no peripheral edema Musculoskeletal: No deformities, no cyanosis or clubbing        Assessment & Plan:

## 2018-05-06 NOTE — Assessment & Plan Note (Signed)
Can add Lasix if she develops pedal edema again

## 2018-05-06 NOTE — Assessment & Plan Note (Signed)
Okay to stay off [name] -at this point side effects seem to be worse than benefit of medication Pulmonary fibrosis appears stable, hopefully she will not need oxygen  Follow-up as needed

## 2018-05-06 NOTE — Patient Instructions (Signed)
Okay to stay off [name] Pulmonary fibrosis appears stable  Follow-up as needed

## 2018-05-11 NOTE — Progress Notes (Signed)
Subjective:   Carmen Duncan was seen in consultation in the movement disorder clinic at the request of [name], MD.  The evaluation is for tremor.  The patient is a 77 y.o. R handed female with a long hx of tremor.   The patient states that the tremor is in the L hand.  She notes it with activation.  If pouring something from a saucepan, it will start to tremor.  It seems to be worse in specific positions.  She has no trouble with putting on makeup or eating as this only affects her nondominant hand.  She has no trouble cutting food.  She has no vocal tremor, head tremor or RUE tremor.  She has no leg tremor.  Sometimes she feels like she is "walking like a drunk" because she is not stable.  She does not feel comfortable near the edge of a pier b/c she feels like she would fall off the edge.  She generally does not fall.  She did fall Jul 16, 2011 because she missed the last 3 steps at her home when she was coming down in a hurry.  She bruised her entire R leg and it is still very sore.    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  I do not have those records.  She saw Dr. [name].  She is on propranolol and she has been on it 7-8 years.  She is on 40 mg twice per day.  It does help.  She states that she was told that her propranolol could not be increased.  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  She sees a Publishing rights manager at [facility] and was told it was nothing to worry about.  She has it monitored q 3 years.  Her last MRI brain was done in [place].   Because of this, she will have intermittent numbness of the L face.    There was a fam hx of tremor in her sister and it is starting in her son, who is 62 years old.  Current/Previously tried tremor medications: propranolol, primidone  Current medications that may exacerbate tremor:  N/a  12/03/12:  She was started on primidone last visit.  She is only taking 1/2  of a 50 mg tablet at night.  She didn't go up to a full tablet because she did not like the way that it made her feel.  She wants to d/c the medication.  She is on propranolol but the dose cannot be increased because her pulse is already on the low end of normal.  She is having some intermittent paresthesias in her fingertips, right more than left.  03/12/13:  Topamax was added last visit, which has helped but has decreased appetite.  She tremors much less now but notices it when stressed, fatigued or under pressure.  She c/o constipation with the topamax.  She has taste aversion with the medication.  She has some paresthesias with the medication.  She remains on propranolol but is off of the primidone.  She thinks that the SE are worth continuing to take the medication.  She has had worsening of vision but thinks that she had that prior to the topamax and has an eye appt Nov 4.  She worked on last visit.  B12 was reported.  Serum protein electrophoresis did not reveal M spike.  RPR was negative.  Urinary protein  electrophoresis showed some free kappa light chains but there was no monoclonal protein, no M spike.  RPR was negative.  07/28/13 update:  The pt was on topamax 200 mg daily.  She was unable to tolerate bid dosing of the medication.  She ended up going back to 100 mg at night and it makes her sleepy at night when she takes the medication but no hang over effect.  She still has some intermittent paresthesias.    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  She does c/o dull fronal headaches, daily for the last month.  She uses sinus rinse and it helps.  States that tremor has been stable.    02/03/14 update:  The patient has a history of essential tremor, and is currently on propranolol 40 mg once a day as well as Topamax 100 mg daily.   Unless she is very tired or upset, she  does well in regards to tremor.   She received notes from [name] at [facility] since last visit.  He just said that her schwannoma was stable in size and she just needs a repeat MRI for 10 years that they will plan on doing.  She did have a repeat urinary protein electrophoresis since our last visit that was unremarkable.  I had the opportunity to review notes from her primary care physician from 01/20/2014.  There were 2 brief episodes that the patient describes in which she was walking and her legs felt weak.  She felt near syncopal.  She was not dizzy but just stated that "I couldn't have moved if I had to."    It only lasted for a few minutes.  She had already decreased the propranolol on her own when these episodes happened.  She did that primarily because she would forget to take it bid but also because of fatigue.    08/04/14 update:  The patient returns today for follow-up.  Much has happened since her last visit.  Medical records were reviewed.  She has a history of essential tremor and was on propranolol 40 mg, but that has since been reduced to what medical records said was 10 mg daily but pt states is 5 mg daily because of other medical issues.  She remains on Topamax 100 mg daily.  In November, 2015, she went to her primary care physician complaining about malaise and she had a nuclear stress test that was a low risk study with a left ventricular ejection fraction of 83%.  She saw cardiology not long thereafter and ended up having a chest x-ray demonstrating bibasilar infiltrates.  A few days later she developed a fever and was treated for community-acquired pneumonia.  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  During that hospitalization, her propranolol was decreased from 40 mg daily to 10 mg daily because of hypotension.  She became more weak during that hospitalization and was discharged to a subacute rehabilitation facility and ultimately left that rehabilitation  facility and was discharged home on December 23.  She does think that tremor may have increased slightly.  08/30/14 update:  The patient is following up today at the request of her ophthalmologist.  Pt states that she just went for a "normal checkup" because her vision had changed a little and "I needed new frames."  She states that her eye doctor thought  that there was a stroke behind the left eye and that led to an MRI of the brain and MRA of the brain on 08/23/2014.  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  There was an enhancing lesion in the right parietal region as well.  I talked on the phone with the radiologist about this lesion and he felt that this was likely a subacute vascular insult and not a metastatic lesion.  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  She had a carotid ultrasound on 07/14/2014 demonstrating 1-39% stenosis bilaterally.  She did an echocardiogram last year when she was in the hospital on 04/20/2014 demonstrating an ejection fraction of 65-70%, moderate diastolic dysfunction and moderate pulmonary HTN  02/06/15 update:  The patient returns today for follow-up.  She is on Topamax, 100 mg daily and propranolol, 10 mg daily for essential tremor.  At our last visit, I had the patient discontinue her aspirin and we changed it to Plavix.  She is doing well on that.  No melena/hematochezia.  No falls.  She had a TEE that was negative for thrombus and demonstrated normal LV function.  She was supposed to wear an event monitor, and she did, but compliance was very limited and she only wore it for part of a few days, so there was limited data.  There was no arrhythmia when she did wear it.  She had a repeat MRI of the brain on 12/16/2014.  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  On prior MRAs this is consistent with atherosclerotic changes.   I have reviewed primary care records since our last visit.  She has been losing weight over the last several months and her primary care physician asked me if it was from the Topamax.  She has had a fairly extensive negative workup.  Although I doubt it given the amount of time that she has been on Topamax, I told him that she could discontinue it.  They opted to leave her on it for now and take a wait and see approach.  She states that she has no appetite but she is forcing herself to eat.    06/08/15 update:  The patient is following up today for her essential tremor.  Last visit I discontinued her Topamax because of weight loss and memory concerns.  She reports that she does well until she gets really tired and then she will have tremor of the L hand and have trouble pouring things.  She remains on propranolol, 10 mg daily for tremor.  I reviewed records since our last visit.  Her B12 level was very low at 177 and the patient admits she was not being compliant with taking her B12.  She is now on monthly injections.  Her B12 was rechecked yesterday and it was 877.  She is doing well on her Plavix and has had no new focal or lateralizing neuro sx's.  No bleeding.    10/26/15 update:  The patient follows up today regarding essential tremor.  She is on propranolol, 20 mg daily, which was increased last visit.  She states that she is doing "okay" - notices it in the L hand when  tired, mad or angry.  She also has a history of a cerebral infarction.  She is on Plavix.  She is on Lipitor, but when her last fasting lipids were checked in June, 2016 her LDL was 101.  I reviewed records since last visit.  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  Off of b12 injections and forgetting to take pills.     01/16/16 update.  The records that were made available to me were reviewed.  This patient is accompanied in the office by her son and daughter in law who supplements the history. Much has happened since our last visit.  In terms of tremor she is doing well and remains on propranolol 20 mg daily.  She went to the hospital on 11/14/15 after a fall with L facial droop, speech change, L sided weakness and ultimately found to have a 1cm DWI positive infarct in the R corona radiata and 1cm DWI positive infarct in the R periventricular WM.  I reviewed these films.  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  Carotid u/s was negative for hemo signficant stenosis.  TEE completed which was negative, EF 55-60%.  LDL was 64.  I had increased her lipitor last visit because LDL was 101 and goal was <70.  She had a loop recorder inserted on 6/22 and then went to rehab from 6/23-7/3.  She then went back to the ED on 7/17 after falling face first in her garage and had a nasal fx.  No surgery needed. She did f/u with BEulas Post MD and her BP was elevated and he started her on amlodipine and removed her stitches.  Today, she states that her strength is back to normal except first thing in the AM she feels weak.  She is living with her family.  Memory has been an issue.  She has trouble remembering why she was in the hospital.  She is having to ask questions multiple times.  Son has concern about meds so he got pill box and that has helped.  Memory was a concern prior to the stroke but got worse after it.  Family states that it probably started in fall 2015 but most noticeable over this year.  Some trouble doing personal finances per family but pt denies this.  Resumed driving this week after son watched her.    03/14/16 update:  The patient follows up today, accompanied by her son, daughter in law, and granddaughter who supplement the history.  Overall, patient has been stable since last visit.  She has a loop recorder  which has not demonstrated any abnormal rhythms.  However, the last remote transmissions that I can see was in July.  She remains on Plavix and Lipitor.  Denies any bleeding.  Remains on propranolol for tremor and is doing well in that regard.  She had testing with Dr. BSi Raideron 02/05/2016 and went over this with the patient and her son and daughter-in-law.  This demonstrated mild vascular dementia, moderate depression and mild generalized anxiety disorder.  It was recommended that the patient not drive.  She has been driving some.   Increased socialization was recommended.  She has been somewhat of a social recluse.  It was recommended that someone oversee her finances.  07/18/16 update:  Patient follows up today, accompanied by her son who supplements the history.  The records that were made available to me were reviewed.  Saw Dr. Elsworth Soho after our last visit and put on zofran for nausea which did help.  She remains on propranolol for tremor, which has been fairly well-controlled.  We started Lexapro last visit, and she states it is helping and family agrees.  She has had a few episodes where she gradually got herself to the floor but wasn't necessarily a hard fall.  2 of them were when she tried to get the bathroom quickly so that she didn't have incontinence.  Both were during the night.  Son doesn't think that she needs bedside commode yet.    01/29/17 update:  Pt seen in f/u with her son and daughter in law who supplements hx.  The records that were made available to me were reviewed.  Becoming more forgetful.  Continues to have issues with nausea.  Still on low dose propranolol 20 mg daily for tremor.  Reviewed BP's and overall were good and better than last visit.  Mood "fair" on lexapro.  Son called prior to the visit to relate concerns about increasing memory change.  When I ask patient about her memory she states "I don't like it."   She is having issues with remembering people. They went on vacation where  she saw her ex-husband who has been remarried for 26 years and the patient asked why no one told her he was remarried and then kept referring to him by his brother's name.   On the same vacation they met an old family friend that the patient had known since she was born and she couldn't remember who's daughter she was.   3 weeks later, they were in [place] with daughter in laws family and were confused why sons family wasn't there.  Less trouble with closer family relationships (the ones she lives with).  Her family did go shopping this weekend at [employer] at [facility] but did think that her family was at [employer] at [employer].   She has had trouble in the middle of the night finding the bathroom.  Woke daughter in law up and wasn't even in pajamas but just t-shirt and had wet self all the way to the restroom.   Having trouble figuring out how to get in bed.  She denies hallucinations.  She states that her appetite is good; her son states that it is better after 5pm.   Son puts pills in pillbox and she is reminded to take them.  She gets up at varied time.  She may stay in pajamas until 4 pm when son is ready to come home.  She can get ready/dressed for herself.  Food is prepared for her.  If they aren't there to prepare food, she won't eat.  Having naps during day, perhaps 2 and they are spontaneous.  Cannot state how long these naps are.  Will sometimes watch TV all day.  Daughter in law works from home but not necessarily next to her all day.  05/13/18 update: Patient is seen today in follow-up for her tremor and dementia.  I have not seen her in 15 months (she canceled several previous appointments).  She is accompanied by her son and daughter-in-law who supplements history.  She is on low-dose propranolol, 20 mg daily for tremor.   Overall, that is fairly well controlled.  She is on Lexapro for mood and reports that is good. memory continues to be an issue.  She was started on donepezil last visit and worked to 10 mg daily.   Records have been  reviewed since our last visit.  She was in the hospital at the end of February/beginning of March with generalized weakness.  Neuroimaging was performed and there was no evidence of new stroke.  She was found to be B12 deficient, which was not really new for her (was 177 3 years ago, and then 198  in the hospital in March).  This was rechecked once she was out of the hospital a month later and was 299, although she was not taking the B12 then (she had of apparently received an initial intramuscular injection in the hospital).  She was sent to a subacute nursing facility for rehab following her acute hospitalization.  She has followed up with Dr. Elease Hashimoto.  Of note, the patient has had nausea, vomiting, and diarrhea.  They held the Aricept because of this and did not notice any change in this.  They then held 1 of her pulmonary medications and noticed resolution of her symptoms.  They never restarted the aricept and don't want to.  She also recently began to complain of dysphagia.  Modified barium swallow was completed on December 17 (today) and there is only a preliminary report present.  This reported frequent penetration with thin liquids but only rare/trace aspiration.  Chin tuck did seem to help.  Mechanical soft diet with thin liquids was recommended.  Outside reports reviewed: historical medical records.  Allergies  Allergen Reactions  . Other Other (See Comments)    Pulls skin off.......Marland Kitchenpaper tape Pulls skin off.......Marland Kitchenpaper tape  . Sulfa Antibiotics Rash  . Latex Itching, Rash and Other (See Comments)    Red angry skin from latex tape  . Sulfonamide Derivatives Rash    hives  . Tape Itching, Rash and Other (See Comments)    Please use paper tape or coban wrap!! Tearing of skin     Current Outpatient Medications on File Prior to Visit  Medication Sig Dispense Refill  . acetaminophen (TYLENOL) 325 MG tablet Take 325 mg by mouth every 6 (six) hours as needed for  headache.     [name] amLODipine (NORVASC) 2.5 MG tablet Take 2 tablets (5 mg total) by mouth daily.    [name] atorvastatin (LIPITOR) 20 MG tablet TAKE 1 TABLET BY MOUTH ONCE DAILY 90 tablet 1  . omeprazole (PRILOSEC) 20 MG capsule TAKE 1 CAPSULE BY MOUTH DAILY 90 capsule 3  . ondansetron (ZOFRAN) 4 MG tablet Take 1 tablet (4 mg total) by mouth every 8 (eight) hours as needed for nausea or vomiting. 30 tablet 0  . oxymetazoline (AFRIN) 0.05 % nasal spray Place 2 sprays into both nostrils 2 (two) times daily.     No current facility-administered medications on file prior to visit.     Past Medical History:  Diagnosis Date  . Acoustic neuroma ([place])   . Anxiety   . Arthritis   . Asthma   . Brain cancer ([place])   . Breast cancer of upper-outer quadrant of right female breast ([place]) 07/19/2015  . Diabetes mellitus without complication ([place])   . Diverticulosis   . Ejection fraction   . Essential tremor    on inderal  . GERD (gastroesophageal reflux disease)   . HOH (hard of hearing) left ear  . Hyperlipidemia   . Hypertension   . IBS (irritable bowel syndrome)   . Interstitial lung disease ([place])   . Pneumonia    04-2014, [facility]  . Stroke [facility])    no deficits, on plavix    Past Surgical History:  Procedure  Laterality Date  . ABDOMINAL HYSTERECTOMY  1980  . BIOPSY BREAST    . BREAST LUMPECTOMY WITH RADIOACTIVE SEED AND SENTINEL LYMPH NODE BIOPSY Right 08/10/2015   Procedure: BREAST LUMPECTOMY WITH RADIOACTIVE SEED AND SENTINEL LYMPH NODE BIOPSY;  Surgeon: Autumn Messing III, MD;  Location: [place];  Service: General;  Laterality: Right;  . CHOLECYSTECTOMY  2011  . EP IMPLANTABLE DEVICE N/A 11/16/2015   Procedure: Loop Recorder Insertion;  Surgeon: [name], MD;  Location: [place] CV LAB;  Service: Cardiovascular;  Laterality: N/A;  . eyes  2007   cararacts  . TEE WITHOUT CARDIOVERSION N/A 09/05/2014   Procedure: TRANSESOPHAGEAL ECHOCARDIOGRAM (TEE);  Surgeon: Lelon Perla,  MD;  Location[facility] ENDOSCOPY;  Service: Cardiovascular;  Laterality: N/A;  . TEE WITHOUT CARDIOVERSION N/A 11/16/2015   Procedure: TRANSESOPHAGEAL ECHOCARDIOGRAM (TEE);  Surgeon: Larey Dresser, MD;  Location: [place];  Service: Cardiovascular;  Laterality: N/A;  . TONSILLECTOMY  1945    Social History   Socioeconomic History  . Marital status: Widowed    Spouse name: Not on file  . Number of children: Not on file  . Years of education: Not on file  . Highest education level: Not on file  Occupational History  . Not on file  Social Needs  . Financial resource strain: Not on file  . Food insecurity:    Worry: Not on file    Inability: Not on file  . Transportation needs:    Medical: Not on file    Non-medical: Not on file  Tobacco Use  . Smoking status: Never Smoker  . Smokeless tobacco: Never Used  Substance and Sexual Activity  . Alcohol use: Yes    Alcohol/week: 0.0 standard drinks    Comment: wine about 2 times a week   . Drug use: No  . Sexual activity: Yes    Partners: Female  Lifestyle  . Physical activity:    Days per week: Not on file    Minutes per session: Not on file  . Stress: Not on file  Relationships  . Social connections:    Talks on phone: Not on file    Gets together: Not on file    Attends religious service: Not on file    Active member of club or organization: Not on file    Attends meetings of clubs or organizations: Not on file    Relationship status: Not on file  . Intimate partner violence:    Fear of current or ex partner: Not on file    Emotionally abused: Not on file    Physically abused: Not on file    Forced sexual activity: Not on file  Other Topics Concern  . Not on file  Social History Narrative  . Not on file    Family Status  Relation Name Status  . Mother  Deceased       acute leukemia  . Father  Deceased       MI, CVA  . Sister  Alive       tremor  . Child  Alive       son, mild tremor  . Annamarie Major  (Not  Specified)    Review of Systems Review of Systems  Unable to perform ROS: Dementia     Objective:   VITALS:   Vitals:   05/12/18 1547  BP: 118/78  Pulse: 76   Wt Readings from Last 3 Encounters:  05/06/18 104 lb (47.2 kg)  03/17/18  110 lb 11.2 oz (50.2 kg)  02/09/18 107 lb (48.5 kg)   Gen:  Appears stated age and in NAD. HEENT:  Normocephalic, atraumatic. The mucous membranes are moist. The superficial temporal arteries are without ropiness or tenderness. Cardiovascular: Regular rate and rhythm. Lungs: Clear to auscultation bilaterally. Neck: There are no carotid bruits noted bilaterally.  NEUROLOGICAL:  Orientation: She is alert and oriented to person.  She actually is aware that this examiner's husband is a physician.  She, however, is otherwise quite confused even on the basics. Montreal Cognitive Assessment  01/29/2017  Visuospatial/ Executive (0/5) 2  Naming (0/3) 3  Attention: Read list of digits (0/2) 2  Attention: Read list of letters (0/1) 1  Attention: Serial 7 subtraction starting at 100 (0/3) 1  Language: Repeat phrase (0/2) 2  Language : Fluency (0/1) 0  Abstraction (0/2) 1  Delayed Recall (0/5) 1  Orientation (0/6) 4  Total 17  Adjusted Score (based on education) 18    Cranial nerves: There is good facial symmetry.  There is pseudoptosis from lid lag bilaterally.    Extraocular muscles are intact and visual fields are full to confrontational testing. Speech is fluent and clear. Soft palate rises symmetrically and there is no tongue deviation. Hearing is intact to conversational tone. Tone: Tone is good throughout.  There is no rigidity. Sensation: Sensation is intact to light touch throughout Coordination:  The patient has no dysdiadichokinesia or dysmetria.  No asymmetries. Motor: Strength is at least antigravity x4. Gait and Station: The patient requires mild assistance out of the chair.  She holds my hands (as if I was a walker and I was walking  backward in front of her) and requires verbal cues.  With verbal cues, she is able to walk fairly well with assistance.  She slightly drags her feet.  MOVEMENT EXAM: Tremor:  There is no tremor of outstretched hands or with intention today   LABS  Lab Results  Component Value Date   WBC 8.9 07/26/2017   HGB 13.8 07/26/2017   HCT 42.1 07/26/2017   HCT 42.7 07/26/2017   MCV 89.4 07/26/2017   PLT 201 07/26/2017     Chemistry      Component Value Date/Time   NA 135 09/25/2017 1336   NA 140 07/29/2017   NA 139 07/26/2015 1251   K 3.9 09/25/2017 1336   K 4.8 07/26/2015 1251   CL 99 09/25/2017 1336   CL 103 05/03/2014 0630   CO2 26 09/25/2017 1336   CO2 28 07/26/2015 1251   BUN 10 09/25/2017 1336   BUN 13 07/29/2017   BUN 13.3 07/26/2015 1251   CREATININE 0.65 09/25/2017 1336   CREATININE 0.8 07/26/2015 1251   GLU 93 07/29/2017      Component Value Date/Time   CALCIUM 8.9 09/25/2017 1336   CALCIUM 9.4 07/26/2015 1251   ALKPHOS 53 07/26/2017 0429   ALKPHOS 79 07/26/2015 1251   AST 21 07/26/2017 0429   AST 20 07/26/2015 1251   ALT 14 07/26/2017 0429   ALT 13 07/26/2015 1251   BILITOT 0.6 07/26/2017 0429   BILITOT 0.37 07/26/2015 1251     Lab Results  Component Value Date   TSH 2.57 09/09/2017   Lab Results  Component Value Date   VITAMINB12 299 09/09/2017   Lab Results  Component Value Date   CHOL 142 07/25/2017   HDL 32 (L) 07/25/2017   LDLCALC 86 07/25/2017   LDLDIRECT 101.0 10/31/2014   TRIG 120 07/25/2017  CHOLHDL 4.4 07/25/2017      Assessment/Plan:   1.  Essential Tremor.  -Talked about whether or not to continue propranolol.  Son would like to continue this.  States that he feels that she needs it for blood pressure anyway.  -Off topamax due to cognitive change 2.  Vascular dementia.    -She had testing with Dr. Si Raider on 02/05/2016 and we have reviewed this, as has Dr. Si Raider.  This demonstrated mild vascular dementia (which has most certainly  progressed over the year), moderate depression and mild generalized anxiety disorder.   -They tried Aricept, but ultimately decided that they do not want to be on it any longer.  -We have definitely seen a decline in her.  Daughter brings in a video in which she is having gait apraxia.  Discussed the nature and pathophysiology of this.  Discussed the caregiving burden which is growing.  Discussed the importance of respite care.  Discussed PACE.  Discussed the [facility].  Patient education was given.  Discussed counseling for the family. 3.  Hx cerebral infarct in 08/2014 and then 10/2015.  11/228 looked embolic as were 2 separate areas of DWI positive lesions.  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  Carotid u/s was negative for hemo signficant stenosis.  TEE completed which was negative, EF 55-60%.  She had a loop recorder inserted on 11/16/15  -On plavix and lipitor.  LDL slightly increased, but decided not to change anything at this point.  Do not think that the benefits outweigh risks at this point.  I did, however, send a message to her primary care physician about the interaction between Plavix and Prilosec.  I asked him to see if we could change to the Prilosec. 4.  B12 deficiency  -Talked to the patient and her family about restarting oral B12.  We can recheck this in the future.  Recommended B12, 1000 mcg daily. 5.  Depression  -We decided to continue the Lexapro as it has worked well. 6. Dysphagia  -Modified barium swallow was completed today, May 12, 2018.  Since this was just done today, there is only a preliminary report.  There was evidence of mild pharyngeal phase dysphagia.   This reported frequent penetration with thin liquids but only rare/trace aspiration.  Chin tuck did seem to help.  Mechanical soft diet with thin liquids was recommended. 7.  F/u 1 year.  Much greater than 50% of this visit was spent in counseling and coordinating care.  Total  face to face time:  45 min

## 2018-05-12 ENCOUNTER — Ambulatory Visit (HOSPITAL_COMMUNITY)
Admission: RE | Admit: 2018-05-12 | Discharge: 2018-05-12 | Disposition: A | Discharge status: Home / Self Care | Payer: Medicare Other | Source: Ambulatory Visit | Attending: Family Medicine | Admitting: Family Medicine

## 2018-05-12 ENCOUNTER — Encounter

## 2018-05-12 ENCOUNTER — Ambulatory Visit (INDEPENDENT_AMBULATORY_CARE_PROVIDER_SITE_OTHER): Payer: Medicare Other | Admitting: Neurology

## 2018-05-12 ENCOUNTER — Encounter: Payer: Self-pay | Admitting: Neurology

## 2018-05-12 VITALS — BP 118/78 | HR 76

## 2018-05-12 DIAGNOSIS — R1319 Other dysphagia: Secondary | ICD-10-CM

## 2018-05-12 DIAGNOSIS — E538 Deficiency of other specified B group vitamins: Secondary | ICD-10-CM

## 2018-05-12 DIAGNOSIS — R1310 Dysphagia, unspecified: Secondary | ICD-10-CM | POA: Diagnosis not present

## 2018-05-12 DIAGNOSIS — G250 Essential tremor: Secondary | ICD-10-CM

## 2018-05-12 DIAGNOSIS — F0150 Vascular dementia without behavioral disturbance: Secondary | ICD-10-CM

## 2018-05-12 DIAGNOSIS — R05 Cough: Secondary | ICD-10-CM | POA: Diagnosis not present

## 2018-05-12 MED ORDER — PROPRANOLOL HCL 20 MG PO TABS: 20.0000 mg | ORAL_TABLET | Freq: Every day | ORAL | 3 refills | Status: DC

## 2018-05-12 MED ORDER — CLOPIDOGREL BISULFATE 75 MG PO TABS: 75.0000 mg | ORAL_TABLET | Freq: Every day | ORAL | 3 refills | Status: DC

## 2018-05-12 MED ORDER — ESCITALOPRAM OXALATE 10 MG PO TABS: 10.0000 mg | ORAL_TABLET | Freq: Every day | ORAL | 3 refills | Status: DC

## 2018-05-13 ENCOUNTER — Other Ambulatory Visit: Payer: Self-pay

## 2018-05-13 ENCOUNTER — Telehealth: Payer: Self-pay | Admitting: Neurology

## 2018-05-13 MED ORDER — PANTOPRAZOLE SODIUM 20 MG PO TBEC: 20.0000 mg | DELAYED_RELEASE_TABLET | Freq: Every day | ORAL | 3 refills | Status: DC

## 2018-05-13 NOTE — Telephone Encounter (Signed)
Jade, let pt son/daughter in law know that I spoke with Dr. Elease Hashimoto and he will be changing the Prilosec to protonix.  Thank you

## 2018-05-13 NOTE — Telephone Encounter (Signed)
Patients son made aware.

## 2018-05-24 ENCOUNTER — Other Ambulatory Visit: Payer: Self-pay | Admitting: Family Medicine

## 2018-05-25 NOTE — Telephone Encounter (Signed)
Refills OK for one year. 

## 2018-05-25 NOTE — Telephone Encounter (Signed)
I see in the medication history that you have filled this in the past.   Last filled by Dr. Cecile Hearing, MD on 07/27/17  Are you still filling this medication? Please advise.

## 2018-06-22 ENCOUNTER — Emergency Department (HOSPITAL_COMMUNITY): Payer: Medicare Other

## 2018-06-22 ENCOUNTER — Ambulatory Visit: Payer: Self-pay

## 2018-06-22 ENCOUNTER — Inpatient Hospital Stay (HOSPITAL_COMMUNITY)
Admission: EM | Admit: 2018-06-22 | Discharge: 2018-06-24 | DRG: 193 | Disposition: A | Discharge status: Skilled Nursing Facility | Payer: Medicare Other | Attending: Internal Medicine | Admitting: Internal Medicine

## 2018-06-22 DIAGNOSIS — Z882 Allergy status to sulfonamides status: Secondary | ICD-10-CM

## 2018-06-22 DIAGNOSIS — I69354 Hemiplegia and hemiparesis following cerebral infarction affecting left non-dominant side: Secondary | ICD-10-CM

## 2018-06-22 DIAGNOSIS — K219 Gastro-esophageal reflux disease without esophagitis: Secondary | ICD-10-CM | POA: Diagnosis present

## 2018-06-22 DIAGNOSIS — Z8349 Family history of other endocrine, nutritional and metabolic diseases: Secondary | ICD-10-CM | POA: Diagnosis not present

## 2018-06-22 DIAGNOSIS — J84112 Idiopathic pulmonary fibrosis: Secondary | ICD-10-CM | POA: Diagnosis present

## 2018-06-22 DIAGNOSIS — J80 Acute respiratory distress syndrome: Secondary | ICD-10-CM | POA: Diagnosis not present

## 2018-06-22 DIAGNOSIS — F0150 Vascular dementia without behavioral disturbance: Secondary | ICD-10-CM | POA: Diagnosis not present

## 2018-06-22 DIAGNOSIS — R4182 Altered mental status, unspecified: Secondary | ICD-10-CM | POA: Diagnosis not present

## 2018-06-22 DIAGNOSIS — G250 Essential tremor: Secondary | ICD-10-CM | POA: Diagnosis not present

## 2018-06-22 DIAGNOSIS — R278 Other lack of coordination: Secondary | ICD-10-CM | POA: Diagnosis not present

## 2018-06-22 DIAGNOSIS — Z853 Personal history of malignant neoplasm of breast: Secondary | ICD-10-CM | POA: Diagnosis not present

## 2018-06-22 DIAGNOSIS — Z90710 Acquired absence of both cervix and uterus: Secondary | ICD-10-CM

## 2018-06-22 DIAGNOSIS — I110 Hypertensive heart disease with heart failure: Secondary | ICD-10-CM | POA: Diagnosis not present

## 2018-06-22 DIAGNOSIS — E119 Type 2 diabetes mellitus without complications: Secondary | ICD-10-CM | POA: Diagnosis not present

## 2018-06-22 DIAGNOSIS — J189 Pneumonia, unspecified organism: Secondary | ICD-10-CM | POA: Diagnosis not present

## 2018-06-22 DIAGNOSIS — I5032 Chronic diastolic (congestive) heart failure: Secondary | ICD-10-CM | POA: Diagnosis present

## 2018-06-22 DIAGNOSIS — R1312 Dysphagia, oropharyngeal phase: Secondary | ICD-10-CM | POA: Diagnosis not present

## 2018-06-22 DIAGNOSIS — Z7902 Long term (current) use of antithrombotics/antiplatelets: Secondary | ICD-10-CM

## 2018-06-22 DIAGNOSIS — H9192 Unspecified hearing loss, left ear: Secondary | ICD-10-CM | POA: Diagnosis not present

## 2018-06-22 DIAGNOSIS — E785 Hyperlipidemia, unspecified: Secondary | ICD-10-CM | POA: Diagnosis not present

## 2018-06-22 DIAGNOSIS — R5381 Other malaise: Secondary | ICD-10-CM | POA: Diagnosis not present

## 2018-06-22 DIAGNOSIS — F329 Major depressive disorder, single episode, unspecified: Secondary | ICD-10-CM | POA: Diagnosis not present

## 2018-06-22 DIAGNOSIS — J45909 Unspecified asthma, uncomplicated: Secondary | ICD-10-CM | POA: Diagnosis present

## 2018-06-22 DIAGNOSIS — F419 Anxiety disorder, unspecified: Secondary | ICD-10-CM | POA: Diagnosis present

## 2018-06-22 DIAGNOSIS — R627 Adult failure to thrive: Secondary | ICD-10-CM | POA: Diagnosis present

## 2018-06-22 DIAGNOSIS — J9601 Acute respiratory failure with hypoxia: Secondary | ICD-10-CM | POA: Diagnosis present

## 2018-06-22 DIAGNOSIS — M2550 Pain in unspecified joint: Secondary | ICD-10-CM | POA: Diagnosis not present

## 2018-06-22 DIAGNOSIS — Z79899 Other long term (current) drug therapy: Secondary | ICD-10-CM

## 2018-06-22 DIAGNOSIS — Z8249 Family history of ischemic heart disease and other diseases of the circulatory system: Secondary | ICD-10-CM | POA: Diagnosis not present

## 2018-06-22 DIAGNOSIS — K589 Irritable bowel syndrome without diarrhea: Secondary | ICD-10-CM | POA: Diagnosis present

## 2018-06-22 DIAGNOSIS — I10 Essential (primary) hypertension: Secondary | ICD-10-CM | POA: Diagnosis present

## 2018-06-22 DIAGNOSIS — R2689 Other abnormalities of gait and mobility: Secondary | ICD-10-CM | POA: Diagnosis not present

## 2018-06-22 DIAGNOSIS — Z85841 Personal history of malignant neoplasm of brain: Secondary | ICD-10-CM | POA: Diagnosis not present

## 2018-06-22 DIAGNOSIS — R410 Disorientation, unspecified: Secondary | ICD-10-CM | POA: Diagnosis not present

## 2018-06-22 DIAGNOSIS — Z66 Do not resuscitate: Secondary | ICD-10-CM | POA: Diagnosis not present

## 2018-06-22 DIAGNOSIS — Z7401 Bed confinement status: Secondary | ICD-10-CM | POA: Diagnosis not present

## 2018-06-22 DIAGNOSIS — Z91040 Latex allergy status: Secondary | ICD-10-CM | POA: Diagnosis not present

## 2018-06-22 DIAGNOSIS — I69391 Dysphagia following cerebral infarction: Secondary | ICD-10-CM | POA: Diagnosis not present

## 2018-06-22 DIAGNOSIS — Z9109 Other allergy status, other than to drugs and biological substances: Secondary | ICD-10-CM

## 2018-06-22 DIAGNOSIS — R918 Other nonspecific abnormal finding of lung field: Secondary | ICD-10-CM | POA: Diagnosis not present

## 2018-06-22 DIAGNOSIS — M1990 Unspecified osteoarthritis, unspecified site: Secondary | ICD-10-CM | POA: Diagnosis not present

## 2018-06-22 DIAGNOSIS — R531 Weakness: Secondary | ICD-10-CM

## 2018-06-22 DIAGNOSIS — M6281 Muscle weakness (generalized): Secondary | ICD-10-CM | POA: Diagnosis not present

## 2018-06-22 DIAGNOSIS — R41841 Cognitive communication deficit: Secondary | ICD-10-CM | POA: Diagnosis not present

## 2018-06-22 LAB — COMPREHENSIVE METABOLIC PANEL
ALT: 11 U/L (ref 0–44)
ANION GAP: 12 (ref 5–15)
AST: 20 U/L (ref 15–41)
Albumin: 2.9 g/dL — ABNORMAL LOW (ref 3.5–5.0)
Alkaline Phosphatase: 51 U/L (ref 38–126)
BUN: 11 mg/dL (ref 8–23)
CO2: 22 mmol/L (ref 22–32)
Calcium: 8.9 mg/dL (ref 8.9–10.3)
Chloride: 99 mmol/L (ref 98–111)
Creatinine, Ser: 0.75 mg/dL (ref 0.44–1.00)
GFR calc non Af Amer: >60 mL/min (ref >60)
Glucose, Bld: 124 mg/dL — ABNORMAL HIGH (ref 70–99)
Potassium: 3.8 mmol/L (ref 3.5–5.1)
Sodium: 133 mmol/L — ABNORMAL LOW (ref 135–145)
Total Bilirubin: 0.7 mg/dL (ref 0.3–1.2)
Total Protein: 8.2 g/dL — ABNORMAL HIGH (ref 6.5–8.1)

## 2018-06-22 LAB — CBC
HCT: 46 % (ref 36.0–46.0)
Hemoglobin: 14.4 g/dL (ref 12.0–15.0)
MCH: 27.2 pg (ref 26.0–34.0)
MCHC: 31.3 g/dL (ref 30.0–36.0)
MCV: 86.8 fL (ref 80.0–100.0)
Platelets: 235 10*3/uL (ref 150–400)
RBC: 5.3 MIL/uL — ABNORMAL HIGH (ref 3.87–5.11)
RDW: 15.3 % (ref 11.5–15.5)
WBC: 10 10*3/uL (ref 4.0–10.5)
nRBC: 0 % (ref 0.0–0.2)

## 2018-06-22 LAB — I-STAT TROPONIN, ED
TROPONIN I, POC: 0.01 ng/mL (ref 0.00–0.08)
Troponin i, poc: 0.03 ng/mL (ref 0.00–0.08)

## 2018-06-22 LAB — DIFFERENTIAL
Abs Immature Granulocytes: 0.05 10*3/uL (ref 0.00–0.07)
BASOS PCT: 1 %
Basophils Absolute: 0.1 10*3/uL (ref 0.0–0.1)
Eosinophils Absolute: 0 10*3/uL (ref 0.0–0.5)
Eosinophils Relative: 0 %
Immature Granulocytes: 1 %
Lymphocytes Relative: 17 %
Lymphs Abs: 1.7 10*3/uL (ref 0.7–4.0)
Monocytes Absolute: 0.8 10*3/uL (ref 0.1–1.0)
Monocytes Relative: 8 %
Neutro Abs: 7.4 10*3/uL (ref 1.7–7.7)
Neutrophils Relative %: 73 %

## 2018-06-22 LAB — INFLUENZA PANEL BY PCR (TYPE A & B)
Influenza A By PCR: NEGATIVE
Influenza B By PCR: NEGATIVE

## 2018-06-22 LAB — APTT: APTT: 30 s (ref 24–36)

## 2018-06-22 LAB — AMMONIA: AMMONIA: 25 umol/L (ref 9–35)

## 2018-06-22 MED ORDER — SODIUM CHLORIDE 0.9 % IV BOLUS
500.0000 mL | Freq: Once | INTRAVENOUS | Status: AC
  Administered 2018-06-22: 500 mL via INTRAVENOUS

## 2018-06-22 MED ORDER — DOXYCYCLINE HYCLATE 100 MG PO TABS
100.0000 mg | ORAL_TABLET | Freq: Two times a day (BID) | ORAL | Status: DC
  Administered 2018-06-23 – 2018-06-24 (×3): 100 mg via ORAL
  Filled 2018-06-22 (×3): qty 1

## 2018-06-22 MED ORDER — SODIUM CHLORIDE 0.9 % IV SOLN
INTRAVENOUS | Status: DC
  Administered 2018-06-23 (×2): via INTRAVENOUS

## 2018-06-22 MED ORDER — AMLODIPINE BESYLATE 2.5 MG PO TABS
2.5000 mg | ORAL_TABLET | Freq: Every day | ORAL | Status: DC
  Administered 2018-06-23: 2.5 mg via ORAL
  Filled 2018-06-22: qty 1

## 2018-06-22 MED ORDER — PANTOPRAZOLE SODIUM 20 MG PO TBEC
20.0000 mg | DELAYED_RELEASE_TABLET | Freq: Every day | ORAL | Status: DC
  Administered 2018-06-23 – 2018-06-24 (×2): 20 mg via ORAL
  Filled 2018-06-22 (×2): qty 1

## 2018-06-22 MED ORDER — ONDANSETRON HCL 4 MG PO TABS: 4.0000 mg | ORAL_TABLET | Freq: Three times a day (TID) | ORAL | Status: DC | PRN

## 2018-06-22 MED ORDER — SODIUM CHLORIDE 0.9% FLUSH
3.0000 mL | Freq: Once | INTRAVENOUS | Status: AC
  Administered 2018-06-22: 3 mL via INTRAVENOUS

## 2018-06-22 MED ORDER — ACETAMINOPHEN 325 MG PO TABS: 650.0000 mg | ORAL_TABLET | Freq: Four times a day (QID) | ORAL | Status: DC | PRN

## 2018-06-22 MED ORDER — ENOXAPARIN SODIUM 40 MG/0.4ML ~~LOC~~ SOLN
40.0000 mg | SUBCUTANEOUS | Status: DC
  Filled 2018-06-22: qty 0.4

## 2018-06-22 MED ORDER — PROPRANOLOL HCL 20 MG PO TABS
20.0000 mg | ORAL_TABLET | Freq: Every day | ORAL | Status: DC
  Administered 2018-06-23: 20 mg via ORAL
  Filled 2018-06-22 (×3): qty 1

## 2018-06-22 MED ORDER — SODIUM CHLORIDE 0.9 % IV SOLN: 1.0000 g | INTRAVENOUS | Status: DC

## 2018-06-22 MED ORDER — ESCITALOPRAM OXALATE 10 MG PO TABS
10.0000 mg | ORAL_TABLET | Freq: Every day | ORAL | Status: DC
  Administered 2018-06-23: 10 mg via ORAL
  Filled 2018-06-22: qty 1

## 2018-06-22 MED ORDER — CLOPIDOGREL BISULFATE 75 MG PO TABS
75.0000 mg | ORAL_TABLET | Freq: Every day | ORAL | Status: DC
  Administered 2018-06-23: 75 mg via ORAL
  Filled 2018-06-22: qty 1

## 2018-06-22 MED ORDER — ATORVASTATIN CALCIUM 10 MG PO TABS
20.0000 mg | ORAL_TABLET | Freq: Every day | ORAL | Status: DC
  Administered 2018-06-23: 20 mg via ORAL
  Filled 2018-06-22: qty 2

## 2018-06-22 MED ORDER — DOXYCYCLINE HYCLATE 50 MG PO CAPS
50.0000 mg | ORAL_CAPSULE | Freq: Once | ORAL | Status: DC
  Filled 2018-06-22: qty 1

## 2018-06-22 MED ORDER — VITAMIN B-12 1000 MCG PO TABS
1000.0000 ug | ORAL_TABLET | Freq: Every day | ORAL | Status: DC
  Administered 2018-06-23 – 2018-06-24 (×2): 1000 ug via ORAL
  Filled 2018-06-22 (×2): qty 1

## 2018-06-22 MED ORDER — SODIUM CHLORIDE 0.9 % IV SOLN
1.0000 g | Freq: Once | INTRAVENOUS | Status: AC
  Administered 2018-06-22: 1 g via INTRAVENOUS
  Filled 2018-06-22: qty 10

## 2018-06-22 NOTE — Telephone Encounter (Signed)
Agree with advice for ER evaluation.  She will likely need some IV fluids.

## 2018-06-22 NOTE — ED Provider Notes (Signed)
[facility] EMERGENCY DEPARTMENT Provider Note   CSN: [id number] Arrival date & time: 06/22/18  1313    History   Chief Complaint Chief Complaint  Patient presents with  . Altered Mental Status    HPI HOORIA GASPARINI is a 78 y.o. female.  HPI  FLO BERROA is a 78 y.o. female with PMH of Q syndrome, anxiety, arthritis, asthma, breast cancer, diabetes, diverticulosis, essential tremor on Inderal, GERD, HLD, HTN, IBS, ILD on no oxygen at home during the day or night, CVA with mild residual left hemiparesis on Plavix who presents with son and daughter-in-law at the bedside with about 6 days of decreased p.o. intake and difficulty walking.  She reportedly previously walked with a walker and required minimal assistance.  On Tuesday evening she began having more difficulty walking and appeared to get tired and weak and could not transfer any longer to a chair.  She was then transitioned to a wheelchair and is used wheelchair since last Wednesday.  Not able to move herself much at all at this time.  Has been more confused progressively over the last couple weeks but worse in the past week.  History of vascular dementia but son reports that she has been confusing people's names and relationships.  Asking more about her parents have been dead for decades.  Decreased p.o. intake significantly and only drinks a small amount of liquid.  Previously her baseline involve her waking up with a large amount of urine in her depends and into her pajamas.  For the past several days she has not had any urine in the morning when waking up.  Has been found sitting on the ground beside of her bed a few times in the last week with no evidence for gross trauma.  Past Medical History:  Diagnosis Date  . Acoustic neuroma ([place])   . Anxiety   . Arthritis   . Asthma   . Brain cancer ([place])   . Breast cancer of upper-outer quadrant of right female breast ([place]) 07/19/2015  . Diabetes mellitus without  complication ([place])   . Diverticulosis   . Ejection fraction   . Essential tremor    on inderal  . GERD (gastroesophageal reflux disease)   . HOH (hard of hearing) left ear  . Hyperlipidemia   . Hypertension   . IBS (irritable bowel syndrome)   . Interstitial lung disease ([place])   . Pneumonia    04-2014, CAP  . Stroke [facility])    no deficits, on plavix    Patient Active Problem List   Diagnosis Date Noted  . PNA (pneumonia) 06/22/2018  . Acute respiratory failure with hypoxia ([place]) 06/22/2018  . Adult failure to thrive 02/09/2018  . Depression 07/28/2017  . Weak 07/24/2017  . Vascular dementia without behavioral disturbance ([place]) 03/14/2016  . Asthma   . CVA (cerebral infarction) 11/17/2015  . Left hemiparesis ([place])   . Acute CVA (cerebrovascular accident) ([place]) 11/15/2015  . CVA (cerebral vascular accident) ([place]) 11/14/2015  . Left-sided muscle weakness 11/14/2015  . Left-sided weakness 11/14/2015  . Breast cancer of upper-outer quadrant of right female breast ([place]) 07/19/2015  . Vitamin B 12 deficiency 03/14/2015  . Bilateral carotid artery disease ([place]) 10/07/2014  . Pulmonary hypertension ([place]) 10/07/2014  . Ejection fraction   . 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 Oral candidiasis 04/28/2014  . Chronic diastolic congestive heart failure ([place]) 04/28/2014  . IPF (idiopathic pulmonary fibrosis) ([place]) 04/21/2014  . Hyponatremia 04/19/2014  .  Thrombocytopenia ([place]) 04/19/2014  . Malnutrition of moderate degree ([place]) 04/19/2014  . Hyperlipidemia 04/18/2014  . Hypovolemia due to dehydration 04/18/2014  . Dyspnea on exertion 04/04/2014  . Abnormal nuclear stress test 04/04/2014  . HOH (hard of hearing)   . Raynaud's phenomenon 03/21/2014  . Tremor, essential 03/12/2013  . Hereditary and idiopathic peripheral neuropathy 08/04/2012  . Squamous cell skin cancer 08/19/2011  . 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 Dyslipidemia 04/03/2010  . Essential hypertension 04/03/2010  .  GERD 04/03/2010  . IBS 04/03/2010  . DEEP VENOUS THROMBOPHLEBITIS, HX OF 04/03/2010  . DIVERTICULITIS, HX OF 04/03/2010    Past Surgical History:  Procedure Laterality Date  . ABDOMINAL HYSTERECTOMY  1980  . BIOPSY BREAST    . BREAST LUMPECTOMY WITH RADIOACTIVE SEED AND SENTINEL LYMPH NODE BIOPSY Right 08/10/2015   Procedure: BREAST LUMPECTOMY WITH RADIOACTIVE SEED AND SENTINEL LYMPH NODE BIOPSY;  Surgeon: Autumn Messing III, MD;  Location: [facility];  Service: General;  Laterality: Right;  . CHOLECYSTECTOMY  2011  . EP IMPLANTABLE DEVICE N/A 11/16/2015   Procedure: Loop Recorder Insertion;  Surgeon: [name], MD;  Location: [facility];  Service: Cardiovascular;  Laterality: N/A;  . eyes  2007   cararacts  . TEE WITHOUT CARDIOVERSION N/A 09/05/2014   Procedure: TRANSESOPHAGEAL ECHOCARDIOGRAM (TEE);  Surgeon: Lelon Perla, MD;  Location: [facility] ENDOSCOPY;  Service: Cardiovascular;  Laterality: N/A;  . TEE WITHOUT CARDIOVERSION N/A 11/16/2015   Procedure: TRANSESOPHAGEAL ECHOCARDIOGRAM (TEE);  Surgeon: Larey Dresser, MD;  Location: [facility];  Service: Cardiovascular;  Laterality: N/A;  . TONSILLECTOMY  1945     OB History   No obstetric history on file.      Home Medications    Prior to Admission medications   Medication Sig Start Date End Date Taking? Authorizing Provider  acetaminophen (TYLENOL) 325 MG tablet Take 650 mg by mouth every 6 (six) hours as needed for headache (pain).    Yes [provider]  amLODipine (NORVASC) 2.5 MG tablet TAKE 1 TABLET BY MOUTH ONCE DAILY. Patient taking differently: Take 2.5 mg by mouth daily.  05/25/18  Yes Burchette, Alinda Sierras, MD  atorvastatin (LIPITOR) 20 MG tablet TAKE 1 TABLET BY MOUTH ONCE DAILY Patient taking differently: Take 20 mg by mouth daily.  03/02/18  Yes Burchette, Alinda Sierras, MD  clopidogrel (PLAVIX) 75 MG tablet Take 1 tablet (75 mg total) by mouth daily. 05/12/18  Yes Tat, Eustace Quail, DO    escitalopram (LEXAPRO) 10 MG tablet Take 1 tablet (10 mg total) by mouth daily. 05/12/18  Yes Tat, Eustace Quail, DO  ondansetron (ZOFRAN) 4 MG tablet Take 1 tablet (4 mg total) by mouth every 8 (eight) hours as needed for nausea or vomiting. 03/29/16  Yes Rigoberto Noel, MD  pantoprazole (PROTONIX) 20 MG tablet Take 1 tablet (20 mg total) by mouth daily. 05/13/18  Yes Burchette, Alinda Sierras, MD  propranolol (INDERAL) 20 MG tablet Take 1 tablet (20 mg total) by mouth daily. 05/12/18  Yes Tat, Eustace Quail, DO  vitamin B-12 (CYANOCOBALAMIN) 1000 MCG tablet Take 1,000 mcg by mouth daily.   Yes [provider]    Family History Family History  Problem Relation Age of Onset  . Hyperlipidemia Mother   . Hypertension Mother   . Hyperlipidemia Father   . Heart disease Father 48  . Stroke Father   . Lung cancer Paternal Uncle        x 4    Social  History Social History   Tobacco Use  . Smoking status: Never Smoker  . Smokeless tobacco: Never Used  Substance Use Topics  . Alcohol use: Yes    Alcohol/week: 0.0 standard drinks    Comment: wine about 2 times a week   . Drug use: No     Allergies   Sulfa antibiotics; Latex; Sulfonamide derivatives; and Tape   Review of Systems Review of Systems  Reason unable to perform ROS: Majority presented by family at bedside.  Constitutional: Positive for activity change, appetite change and fatigue (reported by patient). Negative for chills and fever.  HENT: Negative for ear pain and sore throat.   Eyes: Negative for pain and visual disturbance.  Respiratory: Negative for cough and shortness of breath.   Cardiovascular: Negative for chest pain and palpitations.  Gastrointestinal: Negative for abdominal pain and vomiting.  Genitourinary: Positive for difficulty urinating. Negative for dysuria, flank pain and hematuria.  Musculoskeletal: Negative for arthralgias and back pain.  Skin: Negative for color change and rash.  Neurological: Positive  for speech difficulty (finding words at times) and weakness. Negative for tremors (controlled on inderal), seizures, syncope and facial asymmetry.  Psychiatric/Behavioral: Positive for confusion and sleep disturbance (sleeping more than baseline).  All other systems reviewed and are negative.    Physical Exam Updated Vital Signs BP (!) 150/76   Pulse 88   Temp 98.3 F (36.8 C) (Oral)   Resp (!) 28   SpO2 94%   Physical Exam Vitals signs and nursing note reviewed.  Constitutional:      General: She is not in acute distress.    Appearance: She is well-developed. She is cachectic. She is ill-appearing.  HENT:     Head: Normocephalic and atraumatic.  Eyes:     Conjunctiva/sclera: Conjunctivae normal.  Neck:     Musculoskeletal: Neck supple.  Cardiovascular:     Rate and Rhythm: Normal rate and regular rhythm.     Heart sounds: S1 normal and S2 normal. No murmur.  Pulmonary:     Effort: Pulmonary effort is normal. No tachypnea or respiratory distress.     Breath sounds: Normal breath sounds.  Abdominal:     General: Abdomen is flat.     Palpations: Abdomen is soft.     Tenderness: There is no abdominal tenderness. There is no guarding or rebound.  Skin:    General: Skin is warm and dry.  Neurological:     Mental Status: She is alert. She is disoriented and confused.     GCS: GCS eye subscore is 3. GCS verbal subscore is 4. GCS motor subscore is 6.     Cranial Nerves: Cranial nerves are intact.     Sensory: Sensory deficit present.     Motor: Weakness and abnormal muscle tone present. No tremor.     Comments: Decrease sensation in the left upper and left lower extremity compared to the right.  Strength 5 out of 5 in the right upper extremity, 4-5 in the right lower extremity.  3 out of 5 in the left upper extremity and 2-3 out of 5 in the left lower extremity.  There is mild rigidity in the right upper extremity when patient is relaxed with extension at the elbow    Psychiatric:        Behavior: Behavior is cooperative.      ED Treatments / Results  Labs (all labs ordered are listed, but only abnormal results are displayed) Labs Reviewed  CBC - Abnormal; Notable  for the following components:      Result Value   RBC 5.30 (*)    All other components within normal limits  COMPREHENSIVE METABOLIC PANEL - Abnormal; Notable for the following components:   Sodium 133 (*)    Glucose, Bld 124 (*)    Total Protein 8.2 (*)    Albumin 2.9 (*)    All other components within normal limits  URINE CULTURE  EXPECTORATED SPUTUM ASSESSMENT W REFEX TO RESP CULTURE  GRAM STAIN  RESPIRATORY PANEL BY PCR  CULTURE, BLOOD (ROUTINE X 2)  CULTURE, BLOOD (ROUTINE X 2)  APTT  DIFFERENTIAL  AMMONIA  INFLUENZA PANEL BY PCR (TYPE A & B)  STREP PNEUMONIAE URINARY ANTIGEN  BASIC METABOLIC PANEL  CBC WITH DIFFERENTIAL/PLATELET  URINALYSIS, ROUTINE W REFLEX MICROSCOPIC  I-STAT TROPONIN, ED  I-STAT TROPONIN, ED    EKG EKG Interpretation  Date/Time:  Monday June 22 2018 13:19:50 EST Ventricular Rate:  93 PR Interval:  126 QRS Duration: 80 QT Interval:  346 QTC Calculation: 430 R Axis:   -26 Text Interpretation:  Normal sinus rhythm Left ventricular hypertrophy with repolarization abnormality Septal infarct , age undetermined Abnormal ECG V2 and III with st changes different than february Confirmed by Merrily Pew [phone]) on 06/22/2018 6:13:27 PM   Radiology Ct Head Wo Contrast  Result Date: 06/22/2018 CLINICAL DATA:  Altered mental status over the last 6 days. Lethargy and weakness. EXAM: CT HEAD WITHOUT CONTRAST TECHNIQUE: Contiguous axial images were obtained from the base of the skull through the vertex without intravenous contrast. COMPARISON:  07/24/2017 FINDINGS: Brain: Brainstem and cerebellum do not show any acute finding. Chronic small-vessel ischemic changes of the pons are appreciable. Cerebral hemispheres show atrophy with chronic small-vessel  ischemic changes throughout the white matter. Old left frontal cortical infarction. No sign of mass lesion, hemorrhage, hydrocephalus or extra-axial collection. Vascular: There is atherosclerotic calcification of the major vessels at the base of the brain. Skull: Negative Sinuses/Orbits: Clear/normal Other: None IMPRESSION: No acute finding by CT. Atrophy and chronic small-vessel ischemic changes as outlined above. Old left frontal infarction. Electronically Signed   By: Nelson Chimes M.D.   On: 06/22/2018 14:40   Dg Chest Portable 1 View  Result Date: 06/22/2018 CLINICAL DATA:  78 year old female with a history of altered mental status for 6 days EXAM: PORTABLE CHEST 1 VIEW COMPARISON:  07/24/2017, 11/14/2015, CT 09/04/2015 FINDINGS: Right rotation significantly limits evaluation. Cardiomediastinal silhouette likely unchanged in size and contour. Increasing reticulonodular opacity of the bilateral lungs. No pneumothorax. Blunting of the bilateral costophrenic angles. Osteopenia.  No acute displaced fracture identified. Holter monitor unchanged. IMPRESSION: Increased reticulonodular opacity throughout the lungs suggesting multifocal infection superimposed on chronic interstitial fibrosis/scarring. Either scarring at the lung bases or small pleural effusions. Electronically Signed   By: Corrie Mckusick D.O.   On: 06/22/2018 18:29    Procedures Procedures (including critical care time)  Medications Ordered in ED Medications  acetaminophen (TYLENOL) tablet 650 mg (has no administration in time range)  ondansetron (ZOFRAN) tablet 4 mg (has no administration in time range)  atorvastatin (LIPITOR) tablet 20 mg (has no administration in time range)  propranolol (INDERAL) tablet 20 mg (has no administration in time range)  escitalopram (LEXAPRO) tablet 10 mg (has no administration in time range)  clopidogrel (PLAVIX) tablet 75 mg (has no administration in time range)  pantoprazole (PROTONIX) EC tablet 20 mg  (has no administration in time range)  amLODipine (NORVASC) tablet 2.5 mg (has no administration in time  range)  vitamin B-12 (CYANOCOBALAMIN) tablet 1,000 mcg (has no administration in time range)  0.9 %  sodium chloride infusion (has no administration in time range)  cefTRIAXone (ROCEPHIN) 1 g in sodium chloride 0.9 % 100 mL IVPB (has no administration in time range)  doxycycline (VIBRA-TABS) tablet 100 mg (100 mg Oral Not Given 06/22/18 2242)  sodium chloride flush (NS) 0.9 % injection 3 mL (3 mLs Intravenous Given 06/22/18 1749)  sodium chloride 0.9 % bolus 500 mL (0 mLs Intravenous Stopped 06/22/18 2050)  cefTRIAXone (ROCEPHIN) 1 g in sodium chloride 0.9 % 100 mL IVPB ( Intravenous Stopped 06/22/18 2101)     Initial Impression / Assessment and Plan / ED Course  I have reviewed the triage vital signs and the nursing notes.  Pertinent labs & imaging results that were available during my care of the patient were reviewed by me and considered in my medical decision making (see chart for details).      MDM:  Imaging: CT head shows no acute abnormality.  Atrophy and chronic small vessel ischemic changes.  Old left frontal infarction.  Chest x-ray shows increased reticulonodular opacity throughout the lungs suggesting multifocal infection superimposed on chronic interstitial fibrosis and scarring.  Possibly small pleural effusions bilaterally or scarring.  ED Provider Interpretation of EKG: Sinus rhythm with a rate 93 bpm, left axis deviation, no ST depression.  Subtle ST segment elevation in lead V2 which is new compared to prior EKG.  No interval irregularity.  No right bundle branch block.  No delta wave.  Labs: CMP with sodium 133 otherwise unremarkable, CBC normal, PTT normal, troponin 0.03 then down trended to 0.01, ammonia negative, flu negative, urine studies pending.  On my initial evaluation, patient appears chronically ill. Afebrile and hemodynamically stable.  Disoriented compared to  her baseline but conversational without significant speech difficulty.  Presents with evidence for cognitive decline and weakness and decreased urine output as detailed above in HPI.  On exam, patient has focal neurologic deficits that are consistent with her prior CVA.  These are reportedly at her baseline per family at the bedside.  However, she does have some rigidity in the right upper extremity as detailed in physical exam.  Flat affect.  No tremor noted.  History of vascular dementia as well as CVA and CT head ordered prior to my evaluation shows old left frontal infarction and chronic small vessel ischemic changes.  No evidence for bleed or mass.  No evidence for hydrocephalus.  Patient has formed motion of her neck without pain and no fever and no leukocytosis.  No rashes.  Dominant-itis or encephalitis.  Abdominal exam benign.  Labs overall unremarkable.  Urinalysis .  Patient is on 1 to 2 L nasal cannula which is a new O2 requirement for her.  She was 83% on room air per triage documentation.  No anemia.  Chest x-ray shows likely multifocal pneumonia as above although given her lack of leukocytosis or fever this may be secondary to worsening of her interstitial lung disease.  Only available chest x-ray to compare to is 2017 and it does appear more impressive.,  However, given her shortness of breath and her change in mental status she was treated associated community-acquired pneumonia with Rocephin and doxycycline.  Blood cultures drawn without evidence for overt sepsis at this time.  No evidence for acute CVA but patient's presentation is concerning for failure to thrive versus other neurologic syndrome such as possible parkinsonism.  Urine studies pending.  Admitted to  medicine for further re-evaluation.  The plan for this patient was discussed with Dr. Dayna Barker who voiced agreement and who oversaw evaluation and treatment of this patient.   The patient was fully informed and involved with the  history taking, evaluation, workup including labs/images, and plan. The patient's concerns and questions were addressed to the patient's satisfaction and they expressed agreement with the plan to admit.     Final Clinical Impressions(s) / ED Diagnoses   Final diagnoses:  Altered mental status, unspecified altered mental status type  Confusion  Community acquired pneumonia, unspecified laterality  Generalized weakness    ED Discharge Orders    None       Merriam Brandner, Rodena Goldmann, MD 06/22/18 2356    Merrily Pew, MD 06/23/18 0028

## 2018-06-22 NOTE — H&P (Signed)
History and Physical    Carmen Duncan:[id number] DOB: 1941-04-04 DOA: 06/22/2018  PCP: [name], MD  Patient coming from: Home  Chief Complaint: Generalized weakness and functional decline in the last week  HPI: Carmen Duncan is a 78 y.o. 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  At baseline she walks with a walker.  She has not hardly been able to walk with a walker at all over the last several days.  She is been coughing more than normal and they do not know of any fevers.  She is not been having any nausea vomiting or diarrhea.  She cannot express or provide any history concerning pain but they do not feel like she is in pain.  She is not frequently on antibiotics and has not been hospitalized recently.  There is been no lower extremity edema swelling or any rashes anywhere.  Patient found to have possibly pneumonia and hypoxia on room air at 84%.  She was referred for admission for such.  She is been given doxycycline and Rocephin.  Of note her son is a Software engineer here at [facility] and reports that over a year ago she was hospitalized with pneumonia she did not get better with antibiotics at which point she was diagnosed with pulmonary fibrosis.  Steroids were started at that time and she improved.  He would just like for [place] to be aware of this as a possibility of her current situation.  Review of Systems: As per HPI otherwise 10 point review of systems negative per her son and daughter-in-law  Past Medical History:  Diagnosis Date  . Acoustic neuroma ([place])   . Anxiety   . Arthritis   . Asthma   . Brain cancer ([place])   . Breast cancer of upper-outer quadrant of right female breast ([place]) 07/19/2015  . Diabetes mellitus without  complication ([place])   . Diverticulosis   . Ejection fraction   . Essential tremor    on inderal  . GERD (gastroesophageal reflux disease)   . HOH (hard of hearing) left ear  . Hyperlipidemia   . Hypertension   . IBS (irritable bowel syndrome)   . Interstitial lung disease ([place])   . Pneumonia    04-2014, CAP  . Stroke [facility])    no deficits, on plavix    Past Surgical History:  Procedure Laterality Date  . ABDOMINAL HYSTERECTOMY  1980  . BIOPSY BREAST    . BREAST LUMPECTOMY WITH RADIOACTIVE SEED AND SENTINEL LYMPH NODE BIOPSY Right 08/10/2015   Procedure: BREAST LUMPECTOMY WITH RADIOACTIVE SEED AND SENTINEL LYMPH NODE BIOPSY;  Surgeon: Autumn Messing III, MD;  Location: [place];  Service: General;  Laterality: Right;  . CHOLECYSTECTOMY  2011  . EP IMPLANTABLE DEVICE N/A 11/16/2015   Procedure: Loop Recorder Insertion;  Surgeon: [name], MD;  Location: [place] CV LAB;  Service: Cardiovascular;  Laterality: N/A;  . eyes  2007   cararacts  . TEE WITHOUT CARDIOVERSION N/A 09/05/2014   Procedure: TRANSESOPHAGEAL ECHOCARDIOGRAM (TEE);  Surgeon: Lelon Perla, MD;  Location: [facility] ENDOSCOPY;  Service: Cardiovascular;  Laterality: N/A;  . TEE WITHOUT CARDIOVERSION N/A 11/16/2015   Procedure: TRANSESOPHAGEAL ECHOCARDIOGRAM (TEE);  Surgeon: Larey Dresser, MD;  Location: [place];  Service: Cardiovascular;  Laterality: N/A;  . TONSILLECTOMY  1945     reports that she has never smoked. She has never used smokeless tobacco. She reports current alcohol use. She reports that she does not use drugs.  Allergies  Allergen Reactions  . Sulfa Antibiotics Rash  . Latex Itching, Rash and Other (See Comments)    Red angry skin from latex tape  . Sulfonamide Derivatives Hives and Rash  . Tape Itching, Rash and Other (See Comments)    Regular tape tears skin. Please use paper tape or coban wrap!!     Family History  Problem Relation Age of Onset  . Hyperlipidemia Mother    . Hypertension Mother   . Hyperlipidemia Father   . Heart disease Father 44  . Stroke Father   . Lung cancer Paternal Uncle        x 4    Prior to Admission medications   Medication Sig Start Date End Date Taking? Authorizing Provider  acetaminophen (TYLENOL) 325 MG tablet Take 650 mg by mouth every 6 (six) hours as needed for headache (pain).    Yes [provider]  amLODipine (NORVASC) 2.5 MG tablet TAKE 1 TABLET BY MOUTH ONCE DAILY. Patient taking differently: Take 2.5 mg by mouth daily.  05/25/18  Yes Burchette, Alinda Sierras, MD  atorvastatin (LIPITOR) 20 MG tablet TAKE 1 TABLET BY MOUTH ONCE DAILY Patient taking differently: Take 20 mg by mouth daily.  03/02/18  Yes Burchette, Alinda Sierras, MD  clopidogrel (PLAVIX) 75 MG tablet Take 1 tablet (75 mg total) by mouth daily. 05/12/18  Yes Tat, Eustace Quail, DO  escitalopram (LEXAPRO) 10 MG tablet Take 1 tablet (10 mg total) by mouth daily. 05/12/18  Yes Tat, Eustace Quail, DO  ondansetron (ZOFRAN) 4 MG tablet Take 1 tablet (4 mg total) by mouth every 8 (eight) hours as needed for nausea or vomiting. 03/29/16  Yes Rigoberto Noel, MD  pantoprazole (PROTONIX) 20 MG tablet Take 1 tablet (20 mg total) by mouth daily. 05/13/18  Yes Burchette, Alinda Sierras, MD  propranolol (INDERAL) 20 MG tablet Take 1 tablet (20 mg total) by mouth daily. 05/12/18  Yes Tat, Eustace Quail, DO  vitamin B-12 (CYANOCOBALAMIN) 1000 MCG tablet Take 1,000 mcg by mouth daily.   Yes [provider]    Physical Exam: Vitals:   06/22/18 1800 06/22/18 1830 06/22/18 1900 06/22/18 1930  BP: 133/65 117/63 122/62 134/62  Pulse: 84 80 85 81  Resp: (!) 23 (!) 25 (!) 26 (!) 24  Temp:      TempSrc:      SpO2: 96% 94% 95% 97%      Constitutional: NAD, calm, comfortable Vitals:   06/22/18 1800 06/22/18 1830 06/22/18 1900 06/22/18 1930  BP: 133/65 117/63 122/62 134/62  Pulse: 84 80 85 81  Resp: (!) 23 (!) 25 (!) 26 (!) 24  Temp:      TempSrc:      SpO2: 96% 94% 95% 97%    Eyes: PERRL, lids and conjunctivae normal ENMT: Mucous membranes are dry. Posterior pharynx clear of any exudate or lesions.Normal dentition.  Neck: normal, supple, no masses, no thyromegaly Respiratory: clear to auscultation bilaterally, no wheezing, no crackles. Normal respiratory effort. No accessory muscle use.  Cardiovascular: Regular rate and rhythm, no murmurs / rubs / gallops. No extremity edema. 2+ pedal pulses. No carotid bruits.  Abdomen: no tenderness, no masses palpated. No hepatosplenomegaly. Bowel sounds positive.  Musculoskeletal: no clubbing / cyanosis. No joint deformity upper  and lower extremities. Good ROM, no contractures. Normal muscle tone.  Skin: no rashes, lesions, ulcers. No induration Neurologic: CN 2-12 grossly intact. Psychiatric: Not normal judgment and insight. Alert and oriented x 1. Normal mood.    Labs on Admission: I have personally reviewed following labs and imaging studies  CBC: Recent Labs  Lab 06/22/18 1326  WBC 10.0  NEUTROABS 7.4  HGB 14.4  HCT 46.0  MCV 86.8  PLT 629   Basic Metabolic Panel: Recent Labs  Lab 06/22/18 1326  NA 133*  K 3.8  CL 99  CO2 22  GLUCOSE 124*  BUN 11  CREATININE 0.75  CALCIUM 8.9   GFR: CrCl cannot be calculated (Unknown ideal weight.). Liver Function Tests: Recent Labs  Lab 06/22/18 1326  AST 20  ALT 11  ALKPHOS 51  BILITOT 0.7  PROT 8.2*  ALBUMIN 2.9*   No results for input(s): LIPASE, AMYLASE in the last 168 hours. Recent Labs  Lab 06/22/18 1838  AMMONIA 25   Coagulation Profile: No results for input(s): INR, PROTIME in the last 168 hours. Cardiac Enzymes: No results for input(s): CKTOTAL, CKMB, CKMBINDEX, TROPONINI in the last 168 hours. BNP (last 3 results) No results for input(s): PROBNP in the last 8760 hours. HbA1C: No results for input(s): HGBA1C in the last 72 hours. CBG: No results for input(s): GLUCAP in the last 168 hours. Lipid Profile: No results for input(s):  CHOL, HDL, LDLCALC, TRIG, CHOLHDL, LDLDIRECT in the last 72 hours. Thyroid Function Tests: No results for input(s): TSH, T4TOTAL, FREET4, T3FREE, THYROIDAB in the last 72 hours. Anemia Panel: No results for input(s): VITAMINB12, FOLATE, FERRITIN, TIBC, IRON, RETICCTPCT in the last 72 hours. Urine analysis:    Component Value Date/Time   COLORURINE YELLOW 07/24/2017 2214   APPEARANCEUR CLEAR 07/24/2017 2214   LABSPEC 1.032 (H) 07/24/2017 2214   PHURINE 7.0 07/24/2017 2214   GLUCOSEU NEGATIVE 07/24/2017 2214   HGBUR NEGATIVE 07/24/2017 2214   BILIRUBINUR NEGATIVE 07/24/2017 2214   BILIRUBINUR neg 10/02/2010 1108   KETONESUR NEGATIVE 07/24/2017 2214   PROTEINUR NEGATIVE 07/24/2017 2214   UROBILINOGEN 0.2 04/18/2014 2205   NITRITE NEGATIVE 07/24/2017 2214   LEUKOCYTESUR NEGATIVE 07/24/2017 2214   Sepsis Labs: !!!!!!!!!!!!!!!!!!!!!!!!!!!!!!!!!!!!!!!!!!!! @LABRCNTIP (procalcitonin:4,lacticidven:4) )No results found for this or any previous visit (from the past 240 hour(s)).   Radiological Exams on Admission: Ct Head Wo Contrast  Result Date: 06/22/2018 CLINICAL DATA:  Altered mental status over the last 6 days. Lethargy and weakness. EXAM: CT HEAD WITHOUT CONTRAST TECHNIQUE: Contiguous axial images were obtained from the base of the skull through the vertex without intravenous contrast. COMPARISON:  07/24/2017 FINDINGS: Brain: Brainstem and cerebellum do not show any acute finding. Chronic small-vessel ischemic changes of the pons are appreciable. Cerebral hemispheres show atrophy with chronic small-vessel ischemic changes throughout the white matter. Old left frontal cortical infarction. No sign of mass lesion, hemorrhage, hydrocephalus or extra-axial collection. Vascular: There is atherosclerotic calcification of the major vessels at the base of the brain. Skull: Negative Sinuses/Orbits: Clear/normal Other: None IMPRESSION: No acute finding by CT. Atrophy and chronic small-vessel ischemic  changes as outlined above. Old left frontal infarction. Electronically Signed   By: Nelson Chimes M.D.   On: 06/22/2018 14:40   Dg Chest Portable 1 View  Result Date: 06/22/2018 CLINICAL DATA:  78 year old female with a history of altered mental status for 6 days EXAM: PORTABLE CHEST 1 VIEW COMPARISON:  07/24/2017, 11/14/2015, CT 09/04/2015 FINDINGS: Right rotation significantly limits evaluation. Cardiomediastinal silhouette likely unchanged  in size and contour. Increasing reticulonodular opacity of the bilateral lungs. No pneumothorax. Blunting of the bilateral costophrenic angles. Osteopenia.  No acute displaced fracture identified. Holter monitor unchanged. IMPRESSION: Increased reticulonodular opacity throughout the lungs suggesting multifocal infection superimposed on chronic interstitial fibrosis/scarring. Either scarring at the lung bases or small pleural effusions. Electronically Signed   By: Corrie Mckusick D.O.   On: 06/22/2018 18:29   Old chart reviewed Case discussed with EDP Chest x-ray reviewed with increased multifocal opacities  Assessment/Plan 78 year old female with acute hypoxic respiratory failure and advanced dementia comes in with possible pneumonia but does have a history of pulmonary fibrosis Principal Problem:   PNA (pneumonia)-obtain blood and sputum cultures.  We will empirically start on doxycycline and Rocephin.  Will check swallow evaluation.  Will start oral antibiotics and the plan is if this does not improve her clinical status in the next couple of days to consider this is inflammatory and starting steroids.  Explained to the son I would rather improve her to treat for possible bacterial infection first as this could be the most life-threatening issue.  Also check influenza panel.  Active Problems:   Acute respiratory failure with hypoxia (HCC)-supplemental oxygen and wean as tolerates.    Essential hypertension-continue home meds    IPF (idiopathic pulmonary  fibrosis) (HCC)-again as above if does not improve antibiotics consider the cause to be pulmonary fibrosis flare.  Holding off on steroids at this time.    Chronic diastolic congestive heart failure (HCC)-stable and compensated    Vascular dementia without behavioral disturbance (HCC)-stable    Adult failure to thrive-noted gentle IV fluids   Son is also considering a rehab stay before going back home.  Have had a long discussion with them about her CODE STATUS.  They wish for her to be a DNR.  They also would like to speak to someone about filling out most form for her.  Obtain physical therapy and speech therapy evaluations also while here.   DVT prophylaxis: Lovenox and SCDs Code Status: DNR Family Communication: Son and daughter-in-law Disposition Plan: Days Consults called: None Admission status: Admission   , A MD [facility]  If 7PM-7AM, please contact night-coverage [email or web address] Password [facility]  06/22/2018, 8:42 PM

## 2018-06-22 NOTE — ED Triage Notes (Signed)
Pt here with family due to AMS x 6 days, decreased intake/output, lethargy/weakness. Pt is usually ambulatory with a walker. VSS.

## 2018-06-22 NOTE — Telephone Encounter (Signed)
Will send to Dr Erick Blinks nurse as Juluis Rainier

## 2018-06-22 NOTE — Telephone Encounter (Signed)
Carmen Duncan Patient's daughter in law is calling to report, Change in mental status, change in urine output, increase weakness, now using the wheelchair through the home, transfer from wheel chair to recliner decrease in cognitive ability to do so. Using depends 24/7, noticed past 3 days decrease of urine in the depends at nights. Thinking the patient is dehydrated. Patient is sleeping more- Please advise wanting to know what do? Should she take her to ER into the office [facility]- [phone] ----- Message from Luciana Axe sent at 06/22/2018 9:04 AM EST ----- Carmen Duncan Patient's daughter in law is calling to report, Change in mental status, change in urine output, increase weakness, now using the wheelchair through the home, transfer from wheel chair to recliner decrease in cognitive ability to do so. Using depends 24/7, noticed past 3 days decrease of urine in the depends at nights. Thinking the patient is dehydrated. Patient is sleeping more- Please advise wanting to know what do? Should she take her to ER into the office [facility]- [phone]  Call History  Carmen Duncan reports pt. Has become more and more weak since last week. Can no longer transfer from bed to chair. Had been up ambulating with a walker. Has seen a change in her mental status, decreased urinary output. No fever, but seems "to have chills all the time." Instructed to take pt. To the ED for evaluation. Verbalizes understanding.  Reason for Disposition . [1] Drinking very little AND [2] dehydration suspected (e.g., no urine > 12 hours, very dry mouth, very lightheaded)  Answer Assessment - Initial Assessment Questions 1. DESCRIPTION: "Describe how you are feeling."     Increased weakness and change in mental status 2. SEVERITY: "How bad is it?"  "Can you stand and walk?"   - MILD - Feels weak or tired, but does not interfere with work, school or normal activities   - [place] to stand and walk; weakness interferes with  work, school, or normal activities   - SEVERE - Unable to stand or walk     Severe 3. ONSET:  "When did the weakness begin?"     Last weak 4. CAUSE: "What do you think is causing the weakness?"     Maybe dehydration 5. MEDICINES: "Have you recently started a new medicine or had a change in the amount of a medicine?"     No 6. OTHER SYMPTOMS: "Do you have any other symptoms?" (e.g., chest pain, fever, cough, SOB, vomiting, diarrhea, bleeding, other areas of pain)     Sinus congestion, decreased urinary output 7. PREGNANCY: "Is there any chance you are pregnant?" "When was your last menstrual period?"     n/a  Protocols used: WEAKNESS (GENERALIZED) AND FATIGUE-A-AH

## 2018-06-22 NOTE — Telephone Encounter (Signed)
FYI

## 2018-06-23 ENCOUNTER — Other Ambulatory Visit: Payer: Self-pay

## 2018-06-23 ENCOUNTER — Encounter (HOSPITAL_COMMUNITY): Payer: Self-pay

## 2018-06-23 DIAGNOSIS — R4182 Altered mental status, unspecified: Secondary | ICD-10-CM

## 2018-06-23 DIAGNOSIS — I5032 Chronic diastolic (congestive) heart failure: Secondary | ICD-10-CM

## 2018-06-23 DIAGNOSIS — J189 Pneumonia, unspecified organism: Principal | ICD-10-CM

## 2018-06-23 LAB — RESPIRATORY PANEL BY PCR
Adenovirus: NOT DETECTED
Bordetella pertussis: NOT DETECTED
CHLAMYDOPHILA PNEUMONIAE-RVPPCR: NOT DETECTED
CORONAVIRUS NL63-RVPPCR: NOT DETECTED
Coronavirus 229E: NOT DETECTED
Coronavirus HKU1: NOT DETECTED
Coronavirus OC43: NOT DETECTED
Influenza A: NOT DETECTED
Influenza B: NOT DETECTED
Metapneumovirus: NOT DETECTED
Mycoplasma pneumoniae: NOT DETECTED
PARAINFLUENZA VIRUS 3-RVPPCR: NOT DETECTED
Parainfluenza Virus 1: NOT DETECTED
Parainfluenza Virus 2: NOT DETECTED
Parainfluenza Virus 4: NOT DETECTED
RHINOVIRUS / ENTEROVIRUS - RVPPCR: NOT DETECTED
Respiratory Syncytial Virus: NOT DETECTED

## 2018-06-23 LAB — CBC WITH DIFFERENTIAL/PLATELET
Abs Immature Granulocytes: 0.03 10*3/uL (ref 0.00–0.07)
BASOS ABS: 0.1 10*3/uL (ref 0.0–0.1)
Basophils Relative: 1 %
Eosinophils Absolute: 0.2 10*3/uL (ref 0.0–0.5)
Eosinophils Relative: 3 %
HEMATOCRIT: 39.9 % (ref 36.0–46.0)
Hemoglobin: 12.6 g/dL (ref 12.0–15.0)
Immature Granulocytes: 0 %
LYMPHS ABS: 0.9 10*3/uL (ref 0.7–4.0)
Lymphocytes Relative: 12 %
MCH: 26.7 pg (ref 26.0–34.0)
MCHC: 31.6 g/dL (ref 30.0–36.0)
MCV: 84.5 fL (ref 80.0–100.0)
Monocytes Absolute: 0.6 10*3/uL (ref 0.1–1.0)
Monocytes Relative: 8 %
Neutro Abs: 5.4 10*3/uL (ref 1.7–7.7)
Neutrophils Relative %: 76 %
Platelets: 183 10*3/uL (ref 150–400)
RBC: 4.72 MIL/uL (ref 3.87–5.11)
RDW: 15.2 % (ref 11.5–15.5)
WBC: 7.2 10*3/uL (ref 4.0–10.5)
nRBC: 0 % (ref 0.0–0.2)

## 2018-06-23 LAB — BASIC METABOLIC PANEL
Anion gap: 10 (ref 5–15)
BUN: 13 mg/dL (ref 8–23)
CO2: 23 mmol/L (ref 22–32)
Calcium: 8.7 mg/dL — ABNORMAL LOW (ref 8.9–10.3)
Chloride: 102 mmol/L (ref 98–111)
Creatinine, Ser: 0.57 mg/dL (ref 0.44–1.00)
GFR calc Af Amer: >60 mL/min (ref >60)
GFR calc non Af Amer: >60 mL/min (ref >60)
Glucose, Bld: 91 mg/dL (ref 70–99)
Potassium: 3.4 mmol/L — ABNORMAL LOW (ref 3.5–5.1)
Sodium: 135 mmol/L (ref 135–145)

## 2018-06-23 MED ORDER — SODIUM CHLORIDE 0.9 % IV SOLN: INTRAVENOUS | Status: AC

## 2018-06-23 MED ORDER — POTASSIUM CHLORIDE CRYS ER 20 MEQ PO TBCR
40.0000 meq | EXTENDED_RELEASE_TABLET | Freq: Once | ORAL | Status: AC
  Administered 2018-06-23: 40 meq via ORAL
  Filled 2018-06-23: qty 2

## 2018-06-23 MED ORDER — HEPARIN SODIUM (PORCINE) 5000 UNIT/ML IJ SOLN
5000.0000 [IU] | Freq: Three times a day (TID) | INTRAMUSCULAR | Status: DC
  Administered 2018-06-23 – 2018-06-24 (×3): 5000 [IU] via SUBCUTANEOUS
  Filled 2018-06-23 (×3): qty 1

## 2018-06-23 NOTE — Evaluation (Addendum)
Clinical/Bedside Swallow Evaluation Patient Details  Name: Carmen Duncan MRN: [id number] Date of Birth: 1941/04/24  Today's Date: 06/23/2018 Time: SLP Start Time (ACUTE ONLY): 0845 SLP Stop Time (ACUTE ONLY): 0915 SLP Time Calculation (min) (ACUTE ONLY): 30 min  Past Medical History:  Past Medical History:  Diagnosis Date  . Acoustic neuroma ([place])   . Anxiety   . Arthritis   . Asthma   . Brain cancer ([place])   . Breast cancer of upper-outer quadrant of right female breast ([place]) 07/19/2015  . Diabetes mellitus without complication ([place])   . Diverticulosis   . Ejection fraction   . Essential tremor    on inderal  . GERD (gastroesophageal reflux disease)   . HOH (hard of hearing) left ear  . Hyperlipidemia   . Hypertension   . IBS (irritable bowel syndrome)   . Interstitial lung disease ([place])   . Pneumonia    04-2014, CAP  . Stroke [facility])    no deficits, on plavix   Past Surgical History:  Past Surgical History:  Procedure Laterality Date  . ABDOMINAL HYSTERECTOMY  1980  . BIOPSY BREAST    . BREAST LUMPECTOMY WITH RADIOACTIVE SEED AND SENTINEL LYMPH NODE BIOPSY Right 08/10/2015   Procedure: BREAST LUMPECTOMY WITH RADIOACTIVE SEED AND SENTINEL LYMPH NODE BIOPSY;  Surgeon: Autumn Messing III, MD;  Location: [place];  Service: General;  Laterality: Right;  . CHOLECYSTECTOMY  2011  . EP IMPLANTABLE DEVICE N/A 11/16/2015   Procedure: Loop Recorder Insertion;  Surgeon: [name], MD;  Location: [place] CV LAB;  Service: Cardiovascular;  Laterality: N/A;  . eyes  2007   cararacts  . TEE WITHOUT CARDIOVERSION N/A 09/05/2014   Procedure: TRANSESOPHAGEAL ECHOCARDIOGRAM (TEE);  Surgeon: Lelon Perla, MD;  Location[facility] ENDOSCOPY;  Service: Cardiovascular;  Laterality: N/A;  . TEE WITHOUT CARDIOVERSION N/A 11/16/2015   Procedure: TRANSESOPHAGEAL ECHOCARDIOGRAM (TEE);  Surgeon: Larey Dresser, MD;  Location: [facility] ENDOSCOPY;  Service: Cardiovascular;  Laterality: N/A;  .  TONSILLECTOMY  1945   HPI:  Pt is a 78 y.o. female with medical history significant of moderate - advanced dementia, CVA, hypertension, GERD, pulmonary fibrosis and breast cancer. Family reports decline in her memory loss over the last year is brought in because of 6 days of progressive worsening weakness and decline. Over a year ago she was hospitalized with pneumonia she did not get better with antibiotics and was diagnosed with pulmonary fibrosis. CXR (1/27) revealed increased reticulonodular opacity throughout the lungs suggesting multifocal infection superimposed on chronic interstitial fibrosis/scarring. Pt had recent MBS test on 05/12/18 with results reflecting a mild pharyngeal dysphagia. At that time pt reported frequent choking on liquids.   Assessment / Plan / Recommendation Clinical Impression  Pt, presenting with moderate- advanced dementia, able to recall participating in [employer] (05/12/18), but unable to recall results and recommendations given by SLP. She reported some difficulty with swallowing, including coughing intermittently. Pt demonstrated immediate coughing/throat clearing with ice chips and thin liquids, suggesting sensed aspiration. Pt did not understand directions to complete 3oz water test, which resulted in incompletion of task. She tolerated puree and regular textured solid PO, however prolonged mastication of regular solid noted. Pt denied any pain or discomfort with swallowing during trials. Called family relative to discuss results of BSE and treatment plan, including preference for instrumental testing vs. immediate diet advancement. Given previous MBS, reflecting mild- moderate aspiration risk, and family preference to avoid instrumental testing, recommend regular, thin liquid diet. Will f/u  for tolerance and teach compensatory strategies/swallowing manuevers, with cues as needed. (Prior MBS recommended a chin tuck as a possible helpful strategy)  SLP Visit Diagnosis:  Dysphagia, oropharyngeal phase (R13.12)    Aspiration Risk  Mild aspiration risk    Diet Recommendation Regular;Thin liquid   Liquid Administration via: Cup;Straw Medication Administration: Whole meds with puree Supervision: Patient able to self feed;Staff to assist with self feeding Compensations: Small sips/bites;Slow rate    Other  Recommendations Oral Care Recommendations: Oral care BID   Follow up Recommendations        Frequency and Duration min 2x/week  3 weeks       Prognosis Prognosis for Safe Diet Advancement: Good      Swallow Study   General HPI: Pt is a 78 y.o. female with medical history significant of moderate - advanced dementia, CVA, hypertension, GERD, pulmonary fibrosis and breast cancer. Family reports decline in her memory loss over the last year is brought in because of 6 days of progressive worsening weakness and decline. Over a year ago she was hospitalized with pneumonia she did not get better with antibiotics and was diagnosed with pulmonary fibrosis. CXR (1/27) revealed increased reticulonodular opacity throughout the lungs suggesting multifocal infection superimposed on chronic interstitial fibrosis/scarring. Pt had recent MBS test on 05/12/18 with results reflecting a mild pharyngeal dysphagia. At that time pt reported frequent choking on liquids. Type of Study: Bedside Swallow Evaluation Diet Prior to this Study: NPO Temperature Spikes Noted: No Respiratory Status: Nasal cannula History of Recent Intubation: No Behavior/Cognition: Alert;Cooperative;Pleasant mood(slightly confused/disoriented) Oral Cavity Assessment: Within Functional Limits;Dry Oral Care Completed by SLP: Yes Oral Cavity - Dentition: Adequate natural dentition Vision: Functional for self-feeding Self-Feeding Abilities: Able to feed self;Needs assist Patient Positioning: Upright in bed Baseline Vocal Quality: Normal Volitional Cough: Weak Volitional Swallow: Able to elicit     Oral/Motor/Sensory Function Overall Oral Motor/Sensory Function: Within functional limits   Ice Chips Ice chips: Impaired Presentation: Spoon Pharyngeal Phase Impairments: Cough - Immediate;Throat Clearing - Immediate   Thin Liquid Thin Liquid: Impaired Presentation: Cup;Straw Pharyngeal  Phase Impairments: Throat Clearing - Immediate;Cough - Immediate    Nectar Thick Nectar Thick Liquid: Not tested   Honey Thick Honey Thick Liquid: Not tested   Puree Puree: Within functional limits Presentation: Spoon;Self Fed   Solid     Solid: Within functional limits Presentation: Self Fed Other Comments: prolonged mastication      Ellis Savage, SLP Student 06/23/2018,10:57 AM

## 2018-06-23 NOTE — Plan of Care (Signed)
  Problem: Education: Goal: Knowledge of General Education information will improve Description Including pain rating scale, medication(s)/side effects and non-pharmacologic comfort measures Outcome: Progressing   Problem: Nutrition: Goal: Adequate nutrition will be maintained Outcome: Not Progressing   Problem: Safety: Goal: Ability to remain free from injury will improve Outcome: Progressing   Problem: Skin Integrity: Goal: Risk for impaired skin integrity will decrease Outcome: Progressing

## 2018-06-23 NOTE — Progress Notes (Signed)
Carmen Duncan is a 78 y.o. female patient admitted from ED awake, alert - oriented  X 3- no acute distress noted.  VSS - Blood pressure (!) 160/69, pulse 88, temperature 98 F (36.7 C), temperature source Oral, resp. rate 18, SpO2 100 %.    IV in place, occlusive dsg intact without redness.  Orientation to room, and floor completed with information packet given to patient/family.  Patient declined safety video at this time.  Admission INP armband ID verified with patient/family, and in place.   SR up x 2, fall assessment complete, with patient and family able to verbalize understanding of risk associated with falls, and verbalized understanding to call nsg before up out of bed.  Call light within reach, patient able to voice, and demonstrate understanding.      Will cont to eval and treat per MD orders.  Valorie Roosevelt, RN 06/23/2018 1:56 AM

## 2018-06-23 NOTE — Clinical Social Work Note (Signed)
Clinical Social Work Assessment  Patient Details  Name: Carmen Duncan MRN: [id number] Date of Birth: July 01, 1940  Date of referral:  06/23/18               Reason for consult:  Facility Placement                Permission sought to share information with:  Facility Sport and exercise psychologist, Family Supports Permission granted to share information::  Yes, Verbal Permission Granted  Name::        Agency::  [facility]NF[facility]  Relationship::     Contact Information:     Housing/Transportation Living arrangements for the past 2 months:  Single Family Home Source of Information:  Patient Patient Interpreter Needed:  None Criminal Activity/Legal Involvement Pertinent to Current Situation/Hospitalization:  No - Comment as needed Significant Relationships:  Adult Children Lives with:  Self Do you feel safe going back to the place where you live?  No Need for family participation in patient care:  No (Coment)  Care giving concerns:  CSW received consult for possible SNF placement at time of discharge. CSW spoke with patient's son and daughter-in-law at bedside regarding PT recommendation of SNF placement at time of discharge. Patient's son reported that he is currently unable to care for patient at their home given patient's current physical needs and fall risk. Patient's son expressed understanding of PT recommendation and is agreeable to SNF placement at time of discharge. CSW to continue to follow and assist with discharge planning needs.   Social Worker assessment / plan:  CSW spoke with patient's son concerning possibility of rehab at [facility] before returning home.  Employment status:  Retired Nurse, adult PT Recommendations:  [place] / Referral to community resources:  [place]  Patient/Family's Response to care: Patient's son recognizes need for rehab before returning home and is agreeable to a SNF. Patient's son reported preference  for [facility], [facility] or [facility]. [facility] is not accepting patient's right now due to the flu and [facility] does not have beds available. CSW explained authorization insurance process. Patient's son is also hoping to transition patient into long term care.  Patient/Family's Understanding of and Emotional Response to Diagnosis, Current Treatment, and Prognosis:  Patient/family is realistic regarding therapy needs and expressed being hopeful for SNF placement. Patient's son expressed understanding of CSW role and discharge process as well as medical condition. No questions/concerns about plan or treatment.    Emotional Assessment Appearance:  Appears stated age Attitude/Demeanor/Rapport:  Gracious Affect (typically observed):  Accepting, Appropriate Orientation:  Oriented to Self, Oriented to Place, Oriented to  Time, Oriented to Situation Alcohol / Substance use:  Not Applicable Psych involvement (Current and /or in the community):  No (Comment)  Discharge Needs  Concerns to be addressed:  Care Coordination Readmission within the last 30 days:  No Current discharge risk:  None Barriers to Discharge:  Continued Medical Work up   [employer], LCSW 06/23/2018, 5:06 PM

## 2018-06-23 NOTE — Progress Notes (Signed)
PROGRESS NOTE                                                                                                                                                                                                             Patient Demographics:    Carmen Duncan, is a 78 y.o. female, DOB - 20-May-1941, EPP:[id number]  Admit date - 06/22/2018   Admitting Physician Phillips Grout, MD  Outpatient Primary MD for the patient is [name], MD  LOS - 1  Chief Complaint  Patient presents with  . Altered Mental Status       Brief Narrative  Carmen Duncan is a 78 y.o. 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   Subjective:    Thereasa Solo today has, No headache, No chest pain, No abdominal pain - No Nausea, No new weakness tingling or numbness, No Cough - SOB.     Assessment  & Plan :     1.  Generalized weakness, deconditioning and failure to thrive in a 78 year old female with advanced dementia and pulmonary fibrosis.  Likely due to mild URI versus atypical pneumonia, she is cannot afford influenza, afebrile without leukocytosis.  No productive cough.  Will taper down antibiotics to doxycycline for a total of 4 days, new gentle IV fluids for hydration for another day, added flutter valve for pulmonary toiletry, encouraged to sit up, she is currently in no distress will try to taper off oxygen.  Continue supportive care with nebulizer treatments.  PT eval, if stable discharge in the next 1 to 2 days with outpatient follow-up with her primary pulmonologist Dr. Elsworth Soho.   2.  Underlying advanced vascular dementia.  At risk for delirium.  Minimize narcotics benzodiazepines.  3.  Idiopathic pulmonary fibrosis.  Stable, currently no distress, no hypoxia noted.  Taper off oxygen, supportive care as a #1 above  and follow.  4.  Dyslipidemia.  On statin continue.  5.  GERD.  On PPI.  6. Hypertension.  On combination of Norvasc along with beta-blocker.  Continue both.  7.  Chronic diastolic CHF EF 60% on echocardiogram done in March 2019.  Compensated.    Family Communication  :  None present  Code Status : DNR  Disposition Plan  :  Likely home in am  Consults  :  None  Procedures  :  DVT Prophylaxis  :  Heparin   Lab Results  Component Value Date   PLT 183 06/23/2018    Diet :  Diet Order            Diet regular Room service appropriate? Yes; Fluid consistency: Thin  Diet effective now               Inpatient Medications Scheduled Meds: . amLODipine  2.5 mg Oral Daily  . atorvastatin  20 mg Oral Daily  . clopidogrel  75 mg Oral Daily  . doxycycline  100 mg Oral Q12H  . escitalopram  10 mg Oral Daily  . heparin injection (subcutaneous)  5,000 Units Subcutaneous Q8H  . pantoprazole  20 mg Oral Daily  . potassium chloride  40 mEq Oral Once  . propranolol  20 mg Oral Daily  . vitamin B-12  1,000 mcg Oral Daily   Continuous Infusions: . sodium chloride     PRN Meds:.acetaminophen, ondansetron  Antibiotics  :   Anti-infectives (From admission, onward)   Start     Dose/Rate Route Frequency Ordered Stop   06/23/18 2000  cefTRIAXone (ROCEPHIN) 1 g in sodium chloride 0.9 % 100 mL IVPB  Status:  Discontinued     1 g 200 mL/hr over 30 Minutes Intravenous Every 24 hours 06/22/18 2045 06/23/18 1002   06/22/18 2200  doxycycline (VIBRA-TABS) tablet 100 mg     100 mg Oral Every 12 hours 06/22/18 2045 06/27/18 0953   06/22/18 1915  cefTRIAXone (ROCEPHIN) 1 g in sodium chloride 0.9 % 100 mL IVPB     1 g 200 mL/hr over 30 Minutes Intravenous  Once 06/22/18 1911 06/22/18 2101   06/22/18 1915  doxycycline (VIBRAMYCIN) 50 MG capsule 50 mg  Status:  Discontinued     50 mg Oral Once 06/22/18 1911 06/22/18 2045          Objective:   Vitals:   06/23/18 0000 06/23/18 0030  06/23/18 0135 06/23/18 0544  BP: (!) 162/75 (!) 154/76 (!) 160/69 (!) 164/76  Pulse: 87 89 88 92  Resp: (!) 27 14 18 18   Temp:   98 F (36.7 C) 97.9 F (36.6 C)  TempSrc:   Oral Oral  SpO2: 94% 92% 100% 97%  Height:   5' (1.524 m)     Wt Readings from Last 3 Encounters:  05/06/18 47.2 kg  03/17/18 50.2 kg  02/09/18 48.5 kg     Intake/Output Summary (Last 24 hours) at 06/23/2018 1002 Last data filed at 06/23/2018 0488 Gross per 24 hour  Intake 972.02 ml  Output 250 ml  Net 722.02 ml     Physical Exam  Awake Alert,  No new F.N deficits, Normal affect [place].AT,PERRAL Supple Neck,No JVD, No cervical lymphadenopathy appriciated.  Symmetrical Chest wall movement, Good air movement bilaterally, CTAB RRR,No Gallops,Rubs or new Murmurs, No Parasternal Heave +ve B.Sounds, Abd Soft, No tenderness, No organomegaly appriciated, No rebound - guarding or rigidity. No Cyanosis, Clubbing or edema, No new Rash or bruise     Data Review:    CBC Recent Labs  Lab 06/22/18 1326 06/23/18 0320  WBC 10.0 7.2  HGB 14.4 12.6  HCT 46.0 39.9  PLT 235 183  MCV 86.8 84.5  MCH 27.2 26.7  MCHC 31.3 31.6  RDW 15.3 15.2  LYMPHSABS 1.7 0.9  MONOABS 0.8 0.6  EOSABS 0.0 0.2  BASOSABS 0.1 0.1    Chemistries  Recent Labs  Lab 06/22/18 1326 06/23/18 0320  NA 133* 135  K 3.8 3.4*  CL 99 102  CO2 22 23  GLUCOSE 124* 91  BUN 11 13  CREATININE 0.75 0.57  CALCIUM 8.9 8.7*  AST 20  --   ALT 11  --   ALKPHOS 51  --   BILITOT 0.7  --    ------------------------------------------------------------------------------------------------------------------ No results for input(s): CHOL, HDL, LDLCALC, TRIG, CHOLHDL, LDLDIRECT in the last 72 hours.  Lab Results  Component Value Date   HGBA1C 5.4 07/25/2017   ------------------------------------------------------------------------------------------------------------------ No results for input(s): TSH, T4TOTAL, T3FREE, THYROIDAB in the last 72  hours.  Invalid input(s): FREET3 ------------------------------------------------------------------------------------------------------------------ No results for input(s): VITAMINB12, FOLATE, FERRITIN, TIBC, IRON, RETICCTPCT in the last 72 hours.  Coagulation profile No results for input(s): INR, PROTIME in the last 168 hours.  No results for input(s): DDIMER in the last 72 hours.  Cardiac Enzymes No results for input(s): CKMB, TROPONINI, MYOGLOBIN in the last 168 hours.  Invalid input(s): CK ------------------------------------------------------------------------------------------------------------------ No results found for: BNP  Micro Results Recent Results (from the past 240 hour(s))  Respiratory Panel by PCR     Status: None   Collection Time: 06/22/18  9:08 PM  Result Value Ref Range Status   Adenovirus NOT DETECTED NOT DETECTED Final   Coronavirus 229E NOT DETECTED NOT DETECTED Final   Coronavirus HKU1 NOT DETECTED NOT DETECTED Final   Coronavirus NL63 NOT DETECTED NOT DETECTED Final   Coronavirus OC43 NOT DETECTED NOT DETECTED Final   Metapneumovirus NOT DETECTED NOT DETECTED Final   Rhinovirus / Enterovirus NOT DETECTED NOT DETECTED Final   Influenza A NOT DETECTED NOT DETECTED Final   Influenza B NOT DETECTED NOT DETECTED Final   Parainfluenza Virus 1 NOT DETECTED NOT DETECTED Final   Parainfluenza Virus 2 NOT DETECTED NOT DETECTED Final   Parainfluenza Virus 3 NOT DETECTED NOT DETECTED Final   Parainfluenza Virus 4 NOT DETECTED NOT DETECTED Final   Respiratory Syncytial Virus NOT DETECTED NOT DETECTED Final   Bordetella pertussis NOT DETECTED NOT DETECTED Final   Chlamydophila pneumoniae NOT DETECTED NOT DETECTED Final   Mycoplasma pneumoniae NOT DETECTED NOT DETECTED Final    Comment: Performed at [facility], [address] [place], [place] [address]    Radiology Reports Ct Head Wo Contrast  Result Date: 06/22/2018 CLINICAL DATA:  Altered mental  status over the last 6 days. Lethargy and weakness. EXAM: CT HEAD WITHOUT CONTRAST TECHNIQUE: Contiguous axial images were obtained from the base of the skull through the vertex without intravenous contrast. COMPARISON:  07/24/2017 FINDINGS: Brain: Brainstem and cerebellum do not show any acute finding. Chronic small-vessel ischemic changes of the pons are appreciable. Cerebral hemispheres show atrophy with chronic small-vessel ischemic changes throughout the white matter. Old left frontal cortical infarction. No sign of mass lesion, hemorrhage, hydrocephalus or extra-axial collection. Vascular: There is atherosclerotic calcification of the major vessels at the base of the brain. Skull: Negative Sinuses/Orbits: Clear/normal Other: None IMPRESSION: No acute finding by CT. Atrophy and chronic small-vessel ischemic changes as outlined above. Old left frontal infarction. Electronically Signed   By: Nelson Chimes M.D.   On: 06/22/2018 14:40   Dg Chest Portable 1 View  Result Date: 06/22/2018 CLINICAL DATA:  78 year old female with a history of altered mental status for 6 days EXAM: PORTABLE CHEST 1 VIEW COMPARISON:  07/24/2017, 11/14/2015, CT 09/04/2015 FINDINGS: Right rotation significantly limits evaluation. Cardiomediastinal silhouette likely unchanged in size and contour. Increasing reticulonodular opacity of the bilateral lungs. No pneumothorax. Blunting of the bilateral costophrenic angles. Osteopenia.  No acute displaced fracture identified. Holter monitor unchanged. IMPRESSION: Increased reticulonodular opacity throughout the lungs suggesting multifocal infection superimposed on chronic interstitial fibrosis/scarring. Either scarring at the lung bases or small pleural effusions. Electronically Signed   By: Corrie Mckusick D.O.   On: 06/22/2018 18:29    Time Spent in minutes  30   Lala Lund M.D on 06/23/2018 at 10:02 AM  To page go to [email or web address] - password [email or web address]

## 2018-06-23 NOTE — Evaluation (Signed)
Physical Therapy Evaluation Patient Details Name: Carmen Duncan MRN: [id number] DOB: [birth year] Today's Date: 06/23/2018   History of Present Illness  Pt is a 78 y.o. female admitted 06/22/18 with progressive weakness and cognitive decline. CXR suggestive of multifocal infection superimposed on chronic insterstitial fibrosis/scarring. PMH includes moderate-advanced dementia, CVA, HTN, pulmonary fibrosis.    Clinical Impression  Pt presents with an overall decrease in functional mobility secondary to above. PTA, pt lives at home with family and ambulatory with RW. Today, pt required min-modA to maintain balance with standing mobility; assist for ADLs. Pt limited by generalized weakness, decreased activity tolerance, poor short-term memory and decreased attention. Pt would benefit from continued acute PT services to maximize functional mobility and independence prior to d/c with SNF-level therapies.    Follow Up Recommendations SNF;Supervision/Assistance - 24 hour    Equipment Recommendations  None recommended by PT    Recommendations for Other Services       Precautions / Restrictions Precautions Precautions: Fall Restrictions Weight Bearing Restrictions: No      Mobility  Bed Mobility Overal bed mobility: Needs Assistance Bed Mobility: Supine to Sit     Supine to sit: Min assist;HOB elevated     General bed mobility comments: Frequent cues to stay on task; minA to assist hips to EOB and UE support for trunk elevation.   Transfers Overall transfer level: Needs assistance Equipment used: Rolling walker (2 wheeled) Transfers: Sit to/from Stand Sit to Stand: Min assist;Mod assist         General transfer comment: MinA to stand from EOB with UE support to assist trunk elevation; stood 2x more from recliner requiring modA. Pt reliant on BLE support leaning posterior on chair to prevent LOB  Ambulation/Gait Ambulation/Gait assistance: Mod assist Gait Distance (Feet): 2  Feet Assistive device: Rolling walker (2 wheeled) Gait Pattern/deviations: Step-to pattern;Shuffle;Trunk flexed;Leaning posteriorly Gait velocity: Decreased Gait velocity interpretation: <1.31 ft/sec, indicative of household ambulator General Gait Details: Took steps from bed to recliner with RW, required modA to prevent posterior LOB   Stairs            Wheelchair Mobility    Modified Rankin (Stroke Patients Only)       Balance Overall balance assessment: Needs assistance   Sitting balance-Leahy Scale: Fair       Standing balance-Leahy Scale: Poor Standing balance comment: Brief few seconds of maintaining standing balance with RW, but ultimately with difficulty maintaining anterior weight translation requiring min-modA to prevent posterior LOB                             Pertinent Vitals/Pain Pain Assessment: No/denies pain    Home Living Family/patient expects to be discharged to:: Private residence Living Arrangements: Children Available Help at Discharge: Family;Available 24 hours/day Type of Home: House         Home Equipment: Transport chair;Cane - single point;Walker - 2 wheels;Shower seat - built in;Hand held shower head;Grab bars - tub/shower Additional Comments: Pt lives with her son's family, reports someone is there 24/7. Unsure if reliable historian. Per PT note last year, pt lives on second floor of 2-story home, walk in shower, raised toilet, transport chair/RW/SPC    Prior Function Level of Independence: Needs assistance   Gait / Transfers Assistance Needed: Pt poor historian. Reports using RW at home  ADL's / Homemaking Assistance Needed: Pt poor historian. Reports indep with ADLs; family performs household tasks  Hand Dominance        Extremity/Trunk Assessment   Upper Extremity Assessment Upper Extremity Assessment: Generalized weakness    Lower Extremity Assessment Lower Extremity Assessment: Generalized  weakness    Cervical / Trunk Assessment Cervical / Trunk Assessment: Kyphotic  Communication   Communication: No difficulties  Cognition Arousal/Alertness: Awake/alert Behavior During Therapy: Flat affect Overall Cognitive Status: History of cognitive impairments - at baseline Area of Impairment: Orientation;Attention;Memory;Following commands;Safety/judgement;Awareness;Problem solving                 Orientation Level: Disoriented to;Place;Time;Situation Current Attention Level: Sustained Memory: Decreased short-term memory Following Commands: Follows one step commands inconsistently Safety/Judgement: Decreased awareness of deficits Awareness: Intellectual Problem Solving: Slow processing;Decreased initiation;Difficulty sequencing;Requires verbal cues General Comments: Per chart, mod-severe dementia baseline. Today, very poor short-term memory; frequent cues to stay on task as pt would quickly forget what PT asking her to do      General Comments General comments (skin integrity, edema, etc.): Unable to get reliable SpO2 reading (noted pt with all nails painted), therefore 2L O2 [place] replaced as pt had this on upon entering room    Exercises     Assessment/Plan    PT Assessment Patient needs continued PT services  PT Problem List Decreased strength;Decreased activity tolerance;Decreased balance;Decreased mobility;Decreased cognition;Decreased knowledge of use of DME;Decreased safety awareness       PT Treatment Interventions DME instruction;Gait training;Stair training;Functional mobility training;Therapeutic activities;Balance training;Patient/family education;Cognitive remediation;Therapeutic exercise    PT Goals (Current goals can be found in the Care Plan section)  Acute Rehab PT Goals PT Goal Formulation: Patient unable to participate in goal setting Time For Goal Achievement: 07/07/18    Frequency Min 3X/week   Barriers to discharge        Co-evaluation                AM-PAC PT "6 Clicks" Mobility  Outcome Measure Help needed turning from your back to your side while in a flat bed without using bedrails?: A Little Help needed moving from lying on your back to sitting on the side of a flat bed without using bedrails?: A Little Help needed moving to and from a bed to a chair (including a wheelchair)?: A Little Help needed standing up from a chair using your arms (e.g., wheelchair or bedside chair)?: A Lot Help needed to walk in hospital room?: A Lot Help needed climbing 3-5 steps with a railing? : Total 6 Click Score: 14    End of Session Equipment Utilized During Treatment: Gait belt Activity Tolerance: Patient tolerated treatment well;Patient limited by fatigue Patient left: in chair;with call bell/phone within reach;with chair alarm set Nurse Communication: Mobility status PT Visit Diagnosis: Other abnormalities of gait and mobility (R26.89);Muscle weakness (generalized) (M62.81)    Time: 5520-8022 PT Time Calculation (min) (ACUTE ONLY): 25 min   Charges:   PT Evaluation $PT Eval Moderate Complexity: 1 Mod PT Treatments $Self Care/Home Management: 8-22      Mabeline Caras, PT, DPT Acute Rehabilitation Services  Pager [phone] Office [phone]  Derry Lory 06/23/2018, 12:21 PM

## 2018-06-23 NOTE — NC FL2 (Signed)
[place] LEVEL OF CARE SCREENING TOOL     IDENTIFICATION  Patient Name: Carmen Duncan Birthdate: [birth year] Sex: female Admission Date (Current Location): 06/22/2018  [facility] and [place] Number:  Herbalist and Address:  The [place], [place] [address], [place], [place] [address]      Provider Number: [id number]  Attending Physician Name and Address:  Thurnell Lose, MD  Relative Name and Phone Number:  [name] [phone]    Current Level of Care: Hospital Recommended Level of Care: [place] Prior Approval Number:    Date Approved/Denied: 04/22/14 PASRR Number: [id number]  Discharge Plan: SNF    Current Diagnoses: Patient Active Problem List   Diagnosis Date Noted  . PNA (pneumonia) 06/22/2018  . Acute respiratory failure with hypoxia ([place]) 06/22/2018  . Adult failure to thrive 02/09/2018  . Depression 07/28/2017  . Weak 07/24/2017  . Vascular dementia without behavioral disturbance ([place]) 03/14/2016  . Asthma   . CVA (cerebral infarction) 11/17/2015  . Left hemiparesis ([place])   . Acute CVA (cerebrovascular accident) ([place]) 11/15/2015  . CVA (cerebral vascular accident) ([place]) 11/14/2015  . Left-sided muscle weakness 11/14/2015  . Left-sided weakness 11/14/2015  . Breast cancer of upper-outer quadrant of right female breast ([place]) 07/19/2015  . Vitamin B 12 deficiency 03/14/2015  . Bilateral carotid artery disease ([place]) 10/07/2014  . Pulmonary hypertension ([place]) 10/07/2014  . Ejection fraction   . Cryptogenic stroke ([place]) 10/04/2014  . Oral candidiasis 04/28/2014  . Chronic diastolic congestive heart failure ([place]) 04/28/2014  . IPF (idiopathic pulmonary fibrosis) ([place]) 04/21/2014  . Hyponatremia 04/19/2014  . Thrombocytopenia ([place]) 04/19/2014  . Malnutrition of moderate degree ([place]) 04/19/2014  . Hyperlipidemia 04/18/2014  . Hypovolemia due to dehydration 04/18/2014  . Dyspnea on exertion 04/04/2014   . Abnormal nuclear stress test 04/04/2014  . HOH (hard of hearing)   . Raynaud's phenomenon 03/21/2014  . Tremor, essential 03/12/2013  . Hereditary and idiopathic peripheral neuropathy 08/04/2012  . Squamous cell skin cancer 08/19/2011  . 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 Dyslipidemia 04/03/2010  . Essential hypertension 04/03/2010  . GERD 04/03/2010  . IBS 04/03/2010  . DEEP VENOUS THROMBOPHLEBITIS, HX OF 04/03/2010  . DIVERTICULITIS, HX OF 04/03/2010    Orientation RESPIRATION BLADDER Height & Weight     Self  O2(nasal cannula 2L/min) External catheter, Incontinent Weight:   Height:  5' (152.4 cm)  BEHAVIORAL SYMPTOMS/MOOD NEUROLOGICAL BOWEL NUTRITION STATUS      Incontinent Diet(see discharge summary)  AMBULATORY STATUS COMMUNICATION OF NEEDS Skin   Extensive Assist Verbally Skin abrasions(left lower leg skin abrasion)                       Personal Care Assistance Level of Assistance  Bathing, Feeding, Dressing, Total care Bathing Assistance: Maximum assistance Feeding assistance: Limited assistance Dressing Assistance: Maximum assistance Total Care Assistance: Maximum assistance   Functional Limitations Info  Sight, Hearing, Speech Sight Info: Adequate Hearing Info: Adequate Speech Info: Adequate(some missing teeth)    SPECIAL CARE FACTORS FREQUENCY  PT (By licensed PT), OT (By licensed OT)     PT Frequency: min 5x weekly OT Frequency: min 5x weekly            Contractures Contractures Info: Not present    Additional Factors Info  Allergies, Code Status Code Status Info: DNR Allergies Info: Sulfa antibiotics, latex, sulfonamide derivatives, tape           Current Medications (  06/23/2018):  This is the current hospital active medication list Current Facility-Administered Medications  Medication Dose Route Frequency Provider Last Rate Last Dose  . 0.9 %  sodium chloride infusion   Intravenous Continuous Thurnell Lose, MD 50 mL/hr at 06/23/18  1028    . acetaminophen (TYLENOL) tablet 650 mg  650 mg Oral Q6H PRN Phillips Grout, MD      . amLODipine (NORVASC) tablet 2.5 mg  2.5 mg Oral Daily Phillips Grout, MD   Stopped at 06/23/18 [phone]  . atorvastatin (LIPITOR) tablet 20 mg  20 mg Oral Daily Phillips Grout, MD   Stopped at 06/23/18 0051  . clopidogrel (PLAVIX) tablet 75 mg  75 mg Oral Daily Phillips Grout, MD   Stopped at 06/23/18 [phone]  . doxycycline (VIBRA-TABS) tablet 100 mg  100 mg Oral Q12H Thurnell Lose, MD   100 mg at 06/23/18 1030  . escitalopram (LEXAPRO) tablet 10 mg  10 mg Oral Daily Phillips Grout, MD   Stopped at 06/23/18 0052  . heparin injection 5,000 Units  5,000 Units Subcutaneous Q8H Thurnell Lose, MD   5,000 Units at 06/23/18 1348  . ondansetron (ZOFRAN) tablet 4 mg  4 mg Oral Q8H PRN Phillips Grout, MD      . pantoprazole (PROTONIX) EC tablet 20 mg  20 mg Oral Daily Derrill Kay A, MD   20 mg at 06/23/18 1030  . propranolol (INDERAL) tablet 20 mg  20 mg Oral Daily Phillips Grout, MD   Stopped at 06/23/18 [phone]  . vitamin B-12 (CYANOCOBALAMIN) tablet 1,000 mcg  1,000 mcg Oral Daily Phillips Grout, MD   1,000 mcg at 06/23/18 1029     Discharge Medications: Please see discharge summary for a list of discharge medications.  Relevant Imaging Results:  Relevant Lab Results:   Additional Information SSN: [id number]  Alberteen Sam, LCSW

## 2018-06-24 DIAGNOSIS — I6389 Other cerebral infarction: Secondary | ICD-10-CM | POA: Diagnosis not present

## 2018-06-24 DIAGNOSIS — J80 Acute respiratory distress syndrome: Secondary | ICD-10-CM | POA: Diagnosis not present

## 2018-06-24 DIAGNOSIS — J9601 Acute respiratory failure with hypoxia: Secondary | ICD-10-CM | POA: Diagnosis not present

## 2018-06-24 DIAGNOSIS — Z7401 Bed confinement status: Secondary | ICD-10-CM | POA: Diagnosis not present

## 2018-06-24 DIAGNOSIS — R2689 Other abnormalities of gait and mobility: Secondary | ICD-10-CM | POA: Diagnosis not present

## 2018-06-24 DIAGNOSIS — F329 Major depressive disorder, single episode, unspecified: Secondary | ICD-10-CM | POA: Diagnosis not present

## 2018-06-24 DIAGNOSIS — I110 Hypertensive heart disease with heart failure: Secondary | ICD-10-CM | POA: Diagnosis not present

## 2018-06-24 DIAGNOSIS — R627 Adult failure to thrive: Secondary | ICD-10-CM

## 2018-06-24 DIAGNOSIS — I10 Essential (primary) hypertension: Secondary | ICD-10-CM

## 2018-06-24 DIAGNOSIS — R1312 Dysphagia, oropharyngeal phase: Secondary | ICD-10-CM | POA: Diagnosis not present

## 2018-06-24 DIAGNOSIS — J84112 Idiopathic pulmonary fibrosis: Secondary | ICD-10-CM

## 2018-06-24 DIAGNOSIS — M2550 Pain in unspecified joint: Secondary | ICD-10-CM | POA: Diagnosis not present

## 2018-06-24 DIAGNOSIS — I69391 Dysphagia following cerebral infarction: Secondary | ICD-10-CM | POA: Diagnosis not present

## 2018-06-24 DIAGNOSIS — F0150 Vascular dementia without behavioral disturbance: Secondary | ICD-10-CM | POA: Diagnosis not present

## 2018-06-24 DIAGNOSIS — R5381 Other malaise: Secondary | ICD-10-CM

## 2018-06-24 DIAGNOSIS — K219 Gastro-esophageal reflux disease without esophagitis: Secondary | ICD-10-CM | POA: Diagnosis not present

## 2018-06-24 DIAGNOSIS — I5032 Chronic diastolic (congestive) heart failure: Secondary | ICD-10-CM | POA: Diagnosis not present

## 2018-06-24 DIAGNOSIS — R278 Other lack of coordination: Secondary | ICD-10-CM | POA: Diagnosis not present

## 2018-06-24 DIAGNOSIS — I509 Heart failure, unspecified: Secondary | ICD-10-CM | POA: Diagnosis not present

## 2018-06-24 DIAGNOSIS — E785 Hyperlipidemia, unspecified: Secondary | ICD-10-CM | POA: Diagnosis not present

## 2018-06-24 DIAGNOSIS — R41841 Cognitive communication deficit: Secondary | ICD-10-CM | POA: Diagnosis not present

## 2018-06-24 DIAGNOSIS — Z515 Encounter for palliative care: Secondary | ICD-10-CM | POA: Diagnosis not present

## 2018-06-24 DIAGNOSIS — J189 Pneumonia, unspecified organism: Secondary | ICD-10-CM | POA: Diagnosis not present

## 2018-06-24 LAB — BASIC METABOLIC PANEL
Anion gap: 9 (ref 5–15)
BUN: 7 mg/dL — ABNORMAL LOW (ref 8–23)
CALCIUM: 8.4 mg/dL — AB (ref 8.9–10.3)
CO2: 22 mmol/L (ref 22–32)
Chloride: 103 mmol/L (ref 98–111)
Creatinine, Ser: 0.46 mg/dL (ref 0.44–1.00)
GFR calc Af Amer: >60 mL/min (ref >60)
GFR calc non Af Amer: >60 mL/min (ref >60)
GLUCOSE: 95 mg/dL (ref 70–99)
POTASSIUM: 3.5 mmol/L (ref 3.5–5.1)
Sodium: 134 mmol/L — ABNORMAL LOW (ref 135–145)

## 2018-06-24 MED ORDER — DOXYCYCLINE HYCLATE 100 MG PO TABS: 100.0000 mg | ORAL_TABLET | Freq: Two times a day (BID) | ORAL | 0 refills | Status: AC

## 2018-06-24 NOTE — Progress Notes (Signed)
[facility] has Biochemist, clinical. Will schedule PTAR for 2:30pm.   Cedric Fishman LCSW [phone]

## 2018-06-24 NOTE — Progress Notes (Signed)
Patient's son has requested [facility]. He will complete paperwork there at 2:30pm. CSW will await insurance approval.  Cedric Fishman LCSW [phone]

## 2018-06-24 NOTE — Progress Notes (Signed)
Patient will DC to: [facility] Anticipated DC date: 06/24/2018 Family notified: Son, Sales executive Transport by: Corey Harold 2:30pm   Per MD patient ready for DC to [facility]. RN, patient, patient's family, and facility notified of DC. Discharge Summary and FL2 sent to facility. RN to call report prior to discharge [phone] Room [phone]). DC packet on chart. Ambulance transport requested for patient.   CSW will sign off for now as social work intervention is no longer needed. Please consult [place] again if new needs arise.  Cedric Fishman, LCSW Clinical Social Worker [phone]

## 2018-06-24 NOTE — Clinical Social Work Placement (Signed)
   CLINICAL SOCIAL WORK PLACEMENT  NOTE  Date:  06/24/2018  Patient Details  Name: Carmen Duncan MRN: [id number] Date of Birth: 1941-01-09  Clinical Social Work is seeking post-discharge placement for this patient at the [place] level of care (*CSW will initial, date and re-position this form in  chart as items are completed):  Yes   Patient/family provided with [facility] Work Department's list of facilities offering this level of care within the geographic area requested by the patient (or if unable, by the patient's family).  Yes   Patient/family informed of their freedom to choose among providers that offer the needed level of care, that participate in Medicare, Medicaid or managed care program needed by the patient, have an available bed and are willing to accept the patient.  Yes   Patient/family informed of [facility]'s ownership interest in [facility] and [facility], as well as of the fact that they are under no obligation to receive care at these facilities.  PASRR submitted to EDS on       PASRR number received on       Existing PASRR number confirmed on 06/23/18     FL2 transmitted to all facilities in geographic area requested by pt/family on 06/23/18     FL2 transmitted to all facilities within larger geographic area on       Patient informed that his/her managed care company has contracts with or will negotiate with certain facilities, including the following:        Yes   Patient/family informed of bed offers received.  Patient [facility] bed [facility]     Physician recommends and patient chooses bed at      Patient to be transferred to [facility] on 06/24/18.  Patient to be transferred to facility by PTAR     Patient family notified on 06/24/18 of transfer.  Name of family member notified:  Mitch     PHYSICIAN       Additional Comment:     _______________________________________________ Benard Halsted, LCSW 06/24/2018, 1:26 PM

## 2018-06-24 NOTE — Discharge Summary (Signed)
Physician Discharge Summary  Carmen Duncan XTK:[id number] DOB: 10/19/1940 DOA: 06/22/2018  PCP: [name], MD  Admit date: 06/22/2018 Discharge date: 06/24/2018  Admitted From: Home Disposition:  SNF  Recommendations for Outpatient Follow-up:  1. Follow up with SNF provider at earliest convenience 2. Follow-up in the ED if symptoms worsen or new. 3. Patient might benefit from outpatient evaluation by palliative care   Home Health: No Equipment/Devices: None  Discharge Condition: Guarded to poor CODE STATUS: DNR Diet recommendation: Heart Healthy  Brief/Interim Summary: 78 year old female with history of moderate to advanced dementia, CVA, hypertension, pulmonary fibrosis presented with progressively worsening weakness and deconditioning with failure to thrive.  She probably had mild URI versus atypical pneumonia and was treated with doxycycline.  She was given IV fluids.  PT recommended SNF placement.  She will be discharged to SNF once bed is available.  Overall prognosis is guarded to poor.  Might benefit from outpatient palliative care evaluation.  Discharge Diagnoses:  Principal Problem:   PNA (pneumonia) Active Problems:   Essential hypertension   IPF (idiopathic pulmonary fibrosis) (HCC)   Chronic diastolic congestive heart failure (HCC)   Vascular dementia without behavioral disturbance (HCC)   Adult failure to thrive   Acute respiratory failure with hypoxia (HCC)  Generalized weakness, deconditioning and failure to thrive -PT recommended SNF placement.  Might benefit from outpatient palliative care evaluation  Probable mild URI versus atypical pneumonia -Influenza negative.  Afebrile without leukocytosis.  Treated with oral doxycycline.  Discharged on doxycycline for 2 more days.  Oxygen supplementation if needed.  Idiopathic pulmonary fibrosis -Oxygen supplementation if needed.  In no respiratory distress currently.  Outpatient follow-up with Dr.  Grace Isaac  Advanced vascular dementia -Fall precautions.  Outpatient follow-up  Dyslipidemia -Continue statin  Hypertension -continue Norvasc and metoprolol.  Chronic diastolic CHF with EF of 99% in March 2019 -Compensated.  Continue metoprolol  Discharge Instructions  Discharge Instructions    Call MD for:  difficulty breathing, headache or visual disturbances   Complete by:  As directed    Call MD for:  extreme fatigue   Complete by:  As directed    Call MD for:  hives   Complete by:  As directed    Call MD for:  persistant dizziness or light-headedness   Complete by:  As directed    Call MD for:  persistant nausea and vomiting   Complete by:  As directed    Call MD for:  severe uncontrolled pain   Complete by:  As directed    Call MD for:  temperature >100.4   Complete by:  As directed    Diet - low sodium heart healthy   Complete by:  As directed    Increase activity slowly   Complete by:  As directed      Allergies as of 06/24/2018      Reactions   Sulfa Antibiotics Rash   Latex Itching, Rash, Other (See Comments)   Red angry skin from latex tape   Sulfonamide Derivatives Hives, Rash   Tape Itching, Rash, Other (See Comments)   Regular tape tears skin. Please use paper tape or coban wrap!!      Medication List    TAKE these medications   acetaminophen 325 MG tablet Commonly known as:  TYLENOL Take 650 mg by mouth every 6 (six) hours as needed for headache (pain).   amLODipine 2.5 MG tablet Commonly known as:  NORVASC TAKE 1 TABLET BY MOUTH ONCE DAILY.  atorvastatin 20 MG tablet Commonly known as:  LIPITOR TAKE 1 TABLET BY MOUTH ONCE DAILY   clopidogrel 75 MG tablet Commonly known as:  PLAVIX Take 1 tablet (75 mg total) by mouth daily.   doxycycline 100 MG tablet Commonly known as:  VIBRA-TABS Take 1 tablet (100 mg total) by mouth every 12 (twelve) hours for 2 days.   escitalopram 10 MG tablet Commonly known as:  LEXAPRO Take 1 tablet  (10 mg total) by mouth daily.   ondansetron 4 MG tablet Commonly known as:  ZOFRAN Take 1 tablet (4 mg total) by mouth every 8 (eight) hours as needed for nausea or vomiting.   pantoprazole 20 MG tablet Commonly known as:  PROTONIX Take 1 tablet (20 mg total) by mouth daily.   propranolol 20 MG tablet Commonly known as:  INDERAL Take 1 tablet (20 mg total) by mouth daily.   vitamin B-12 1000 MCG tablet Commonly known as:  CYANOCOBALAMIN Take 1,000 mcg by mouth daily.      Follow-up Information    [place]chette, Alinda Sierras, MD.   Specialty:  Family Medicine Contact information: [place] [address] [phone]        Palliative care. Schedule an appointment as soon as possible for a visit in 1 week(s).          Allergies  Allergen Reactions  . Sulfa Antibiotics Rash  . Latex Itching, Rash and Other (See Comments)    Red angry skin from latex tape  . Sulfonamide Derivatives Hives and Rash  . Tape Itching, Rash and Other (See Comments)    Regular tape tears skin. Please use paper tape or coban wrap!!     Consultations:  None   Procedures/Studies: Ct Head Wo Contrast  Result Date: 06/22/2018 CLINICAL DATA:  Altered mental status over the last 6 days. Lethargy and weakness. EXAM: CT HEAD WITHOUT CONTRAST TECHNIQUE: Contiguous axial images were obtained from the base of the skull through the vertex without intravenous contrast. COMPARISON:  07/24/2017 FINDINGS: Brain: Brainstem and cerebellum do not show any acute finding. Chronic small-vessel ischemic changes of the pons are appreciable. Cerebral hemispheres show atrophy with chronic small-vessel ischemic changes throughout the white matter. Old left frontal cortical infarction. No sign of mass lesion, hemorrhage, hydrocephalus or extra-axial collection. Vascular: There is atherosclerotic calcification of the major vessels at the base of the brain. Skull: Negative Sinuses/Orbits: Clear/normal Other:  None IMPRESSION: No acute finding by CT. Atrophy and chronic small-vessel ischemic changes as outlined above. Old left frontal infarction. Electronically Signed   By: Nelson Chimes M.D.   On: 06/22/2018 14:40   Dg Chest Portable 1 View  Result Date: 06/22/2018 CLINICAL DATA:  78 year old female with a history of altered mental status for 6 days EXAM: PORTABLE CHEST 1 VIEW COMPARISON:  07/24/2017, 11/14/2015, CT 09/04/2015 FINDINGS: Right rotation significantly limits evaluation. Cardiomediastinal silhouette likely unchanged in size and contour. Increasing reticulonodular opacity of the bilateral lungs. No pneumothorax. Blunting of the bilateral costophrenic angles. Osteopenia.  No acute displaced fracture identified. Holter monitor unchanged. IMPRESSION: Increased reticulonodular opacity throughout the lungs suggesting multifocal infection superimposed on chronic interstitial fibrosis/scarring. Either scarring at the lung bases or small pleural effusions. Electronically Signed   By: Corrie Mckusick D.O.   On: 06/22/2018 18:29   Subjective: Patient seen and examined at bedside.  No overnight fever, nausea or vomiting reported by nursing staff.  Patient is awake but a poor historian, feels weak but slightly better.  Discharge Exam:  Vitals:   06/23/18 2113 06/24/18 0455  BP: (!) 150/73 132/62  Pulse: 98 77  Resp: 17 18  Temp: 98.8 F (37.1 C) 97.7 F (36.5 C)  SpO2: 93% 97%   Vitals:   06/23/18 1435 06/23/18 1850 06/23/18 2113 06/24/18 0455  BP: (!) 145/74 (!) 131/59 (!) 150/73 132/62  Pulse: 94 (!) 102 98 77  Resp: 20  17 18   Temp: 99.9 F (37.7 C) 99.1 F (37.3 C) 98.8 F (37.1 C) 97.7 F (36.5 C)  TempSrc: Oral Oral    SpO2: (!) 76% 95% 93% 97%  Height:        General: Pt is awake, poor historian.  No distress  cardiovascular: rate controlled, S1/S2 + Respiratory: bilateral decreased breath sounds at bases, no wheezing; some scattered crackles Abdominal: Soft, NT, ND, bowel sounds  + Extremities: no edema, no cyanosis    The results of significant diagnostics from this hospitalization (including imaging, microbiology, ancillary and laboratory) are listed below for reference.     Microbiology: Recent Results (from the past 240 hour(s))  Respiratory Panel by PCR     Status: None   Collection Time: 06/22/18  9:08 PM  Result Value Ref Range Status   Adenovirus NOT DETECTED NOT DETECTED Final   Coronavirus 229E NOT DETECTED NOT DETECTED Final   Coronavirus HKU1 NOT DETECTED NOT DETECTED Final   Coronavirus NL63 NOT DETECTED NOT DETECTED Final   Coronavirus OC43 NOT DETECTED NOT DETECTED Final   Metapneumovirus NOT DETECTED NOT DETECTED Final   Rhinovirus / Enterovirus NOT DETECTED NOT DETECTED Final   Influenza A NOT DETECTED NOT DETECTED Final   Influenza B NOT DETECTED NOT DETECTED Final   Parainfluenza Virus 1 NOT DETECTED NOT DETECTED Final   Parainfluenza Virus 2 NOT DETECTED NOT DETECTED Final   Parainfluenza Virus 3 NOT DETECTED NOT DETECTED Final   Parainfluenza Virus 4 NOT DETECTED NOT DETECTED Final   Respiratory Syncytial Virus NOT DETECTED NOT DETECTED Final   Bordetella pertussis NOT DETECTED NOT DETECTED Final   Chlamydophila pneumoniae NOT DETECTED NOT DETECTED Final   Mycoplasma pneumoniae NOT DETECTED NOT DETECTED Final    Comment: Performed at [facility], [address] [place], [place] [address]     Labs: BNP (last 3 results) No results for input(s): BNP in the last 8760 hours. Basic Metabolic Panel: Recent Labs  Lab 06/22/18 1326 06/23/18 0320 06/24/18 0329  NA 133* 135 134*  K 3.8 3.4* 3.5  CL 99 102 103  CO2 22 23 22   GLUCOSE 124* 91 95  BUN 11 13 7*  CREATININE 0.75 0.57 0.46  CALCIUM 8.9 8.7* 8.4*   Liver Function Tests: Recent Labs  Lab 06/22/18 1326  AST 20  ALT 11  ALKPHOS 51  BILITOT 0.7  PROT 8.2*  ALBUMIN 2.9*   No results for input(s): LIPASE, AMYLASE in the last 168 hours. Recent Labs  Lab  06/22/18 1838  AMMONIA 25   CBC: Recent Labs  Lab 06/22/18 1326 06/23/18 0320  WBC 10.0 7.2  NEUTROABS 7.4 5.4  HGB 14.4 12.6  HCT 46.0 39.9  MCV 86.8 84.5  PLT 235 183   Cardiac Enzymes: No results for input(s): CKTOTAL, CKMB, CKMBINDEX, TROPONINI in the last 168 hours. BNP: Invalid input(s): POCBNP CBG: No results for input(s): GLUCAP in the last 168 hours. D-Dimer No results for input(s): DDIMER in the last 72 hours. Hgb A1c No results for input(s): HGBA1C in the last 72 hours. Lipid Profile No results  for input(s): CHOL, HDL, LDLCALC, TRIG, CHOLHDL, LDLDIRECT in the last 72 hours. Thyroid function studies No results for input(s): TSH, T4TOTAL, T3FREE, THYROIDAB in the last 72 hours.  Invalid input(s): FREET3 Anemia work up No results for input(s): VITAMINB12, FOLATE, FERRITIN, TIBC, IRON, RETICCTPCT in the last 72 hours. Urinalysis    Component Value Date/Time   COLORURINE YELLOW 07/24/2017 2214   APPEARANCEUR CLEAR 07/24/2017 2214   LABSPEC 1.032 (H) 07/24/2017 2214   PHURINE 7.0 07/24/2017 2214   GLUCOSEU NEGATIVE 07/24/2017 2214   HGBUR NEGATIVE 07/24/2017 2214   BILIRUBINUR NEGATIVE 07/24/2017 2214   BILIRUBINUR neg 10/02/2010 1108   KETONESUR NEGATIVE 07/24/2017 2214   PROTEINUR NEGATIVE 07/24/2017 2214   UROBILINOGEN 0.2 04/18/2014 2205   NITRITE NEGATIVE 07/24/2017 2214   LEUKOCYTESUR NEGATIVE 07/24/2017 2214   Sepsis Labs Invalid input(s): PROCALCITONIN,  WBC,  LACTICIDVEN Microbiology Recent Results (from the past 240 hour(s))  Respiratory Panel by PCR     Status: None   Collection Time: 06/22/18  9:08 PM  Result Value Ref Range Status   Adenovirus NOT DETECTED NOT DETECTED Final   Coronavirus 229E NOT DETECTED NOT DETECTED Final   Coronavirus HKU1 NOT DETECTED NOT DETECTED Final   Coronavirus NL63 NOT DETECTED NOT DETECTED Final   Coronavirus OC43 NOT DETECTED NOT DETECTED Final   Metapneumovirus NOT DETECTED NOT DETECTED Final    Rhinovirus / Enterovirus NOT DETECTED NOT DETECTED Final   Influenza A NOT DETECTED NOT DETECTED Final   Influenza B NOT DETECTED NOT DETECTED Final   Parainfluenza Virus 1 NOT DETECTED NOT DETECTED Final   Parainfluenza Virus 2 NOT DETECTED NOT DETECTED Final   Parainfluenza Virus 3 NOT DETECTED NOT DETECTED Final   Parainfluenza Virus 4 NOT DETECTED NOT DETECTED Final   Respiratory Syncytial Virus NOT DETECTED NOT DETECTED Final   Bordetella pertussis NOT DETECTED NOT DETECTED Final   Chlamydophila pneumoniae NOT DETECTED NOT DETECTED Final   Mycoplasma pneumoniae NOT DETECTED NOT DETECTED Final    Comment: Performed at [facility], [place] [address], [place], [place] [address]     Time coordinating discharge: 35 minutes  SIGNED:   Aline August, MD  [facility] 06/24/2018, 9:18 AM Pager: [phone]  If 7PM-7AM, please contact night-coverage [email or web address] Password [email or web address]

## 2018-06-24 NOTE — Progress Notes (Signed)
  Speech Language Pathology Treatment: Dysphagia  Patient Details Name: Carmen Duncan MRN: [id number] DOB: 01-03-1941 Today's Date: 06/24/2018 Time: 1130-1140 SLP Time Calculation (min) (ACUTE ONLY): 10 min  Assessment / Plan / Recommendation Clinical Impression  Pt reported having no difficulty with breakfast this am, except with eating bacon which she said was "too tough" to chew. Family member, who arrived at end of tx session, reported pt having coughing episode last night at dinner eating fried chicken. Other than this episode, family and pt report little to no coughing during meals. Pt observed drinking large sips of thin liquid  with no s/sx of aspiration. Per previous MBS recommendations, verbal instruction and visual demonstration of chin tuck manuver given. Pt demonstrated strategy, during PO intake, with max verbal, visual and tactile cues to tuck- sip- swallow. Family educated regarding strategy and advised to help pt choose food that is easier for her to chew and swallow (especially if upgraded to regular diet). Recommend continuation of soft, thin liquid diet. Will f/u for tolerance and use of strategies with instruction and cues as needed.    HPI HPI: Pt is a 78 y.o. female with medical history significant of moderate - advanced dementia, CVA, hypertension, GERD, pulmonary fibrosis and breast cancer. Family reports decline in her memory loss over the last year is brought in because of 6 days of progressive worsening weakness and decline. Over a year ago she was hospitalized with pneumonia she did not get better with antibiotics and was diagnosed with pulmonary fibrosis. CXR (1/27) revealed increased reticulonodular opacity throughout the lungs suggesting multifocal infection superimposed on chronic interstitial fibrosis/scarring. Pt had recent MBS test on 05/12/18 with results reflecting a mild pharyngeal dysphagia. At that time pt reported frequent choking on liquids.      SLP Plan  Continue with current plan of care       Recommendations  Diet recommendations: Dysphagia 3 (mechanical soft);Thin liquid Liquids provided via: Straw Medication Administration: Whole meds with puree Supervision: Patient able to self feed;Staff to assist with self feeding Postural Changes and/or Swallow Maneuvers: Chin tuck(as needed)                Oral Care Recommendations: Oral care BID Follow up Recommendations: Skilled Nursing facility SLP Visit Diagnosis: Dysphagia, oropharyngeal phase (R13.12) Plan: Continue with current plan of care       GO                Ellis Savage, SLP Student 06/24/2018, 12:02 PM

## 2018-06-24 NOTE — Progress Notes (Signed)
Occupational Therapy Evaluation Patient Details Name: Carmen Duncan MRN: [id number] DOB: 09-18-1940 Today's Date: 06/24/2018    History of Present Illness Pt is a 78 y.o. female admitted 06/22/18 with progressive weakness and cognitive decline. CXR suggestive of multifocal infection superimposed on chronic insterstitial fibrosis/scarring. PMH includes moderate-advanced dementia, CVA, HTN, pulmonary fibrosis.   Clinical Impression   Per pt's chart, PTA pt lived at home with family and was modified independent with functional mobility at RW level. Pt currently demonstrates gross decrease in functional mobility secondary to above. Pt currently requires mod-maxA for ADL and functional mobility. Pt will benefit from continued OT services to maximize independence and functional mobility to allow d/c to SNF.     Follow Up Recommendations  SNF;Supervision/Assistance - 24 hour    Equipment Recommendations  Other (comment)(defer to next venue)    Recommendations for Other Services PT consult     Precautions / Restrictions Precautions Precautions: Fall Restrictions Weight Bearing Restrictions: No      Mobility Bed Mobility Overal bed mobility: Needs Assistance Bed Mobility: Supine to Sit     Supine to sit: Max assist;HOB elevated     General bed mobility comments: frequent multimodal cues to progress to EOB;maxA to progress LE off bed and modA to progress trunk to upright and for stability while seated EOB;pt required maxA to return BLE to bed and reposition in bed  Transfers Overall transfer level: Needs assistance Equipment used: Rolling walker (2 wheeled) Transfers: Sit to/from Stand Sit to Stand: Max assist         General transfer comment: MaxA to stand from EOB with UE support to assist trunk elevation; Pt reliant on BLE support leaning posterior on chair to prevent LOB    Balance Overall balance assessment: Needs assistance Sitting-balance support: Single extremity  supported;Feet supported Sitting balance-Leahy Scale: Fair Sitting balance - Comments: pt able to adjust socks while seated EOB with modA for stability  Postural control: Posterior lean Standing balance support: Bilateral upper extremity supported Standing balance-Leahy Scale: Zero Standing balance comment: pt required maxA to maintain balance while standing, heavy reliance on BLE leaning posterior on external surface to prevent LOB                           ADL either performed or assessed with clinical judgement   ADL Overall ADL's : Needs assistance/impaired Eating/Feeding: Set up;Sitting Eating/Feeding Details (indicate cue type and reason): pt eating breakfast upon arrival Grooming: Min guard   Upper Body Bathing: Minimal assistance;Sitting   Lower Body Bathing: Moderate assistance;Sitting/lateral leans   Upper Body Dressing : Minimal assistance;Sitting   Lower Body Dressing: Moderate assistance;Maximal assistance;Sit to/from stand Lower Body Dressing Details (indicate cue type and reason): modA to don while LB while sitting EOB, maxA for sit<>stand transfer                General ADL Comments: pt declined OOB mobility, agreed to stand from EOB;pt demonstrated posterior lean while seated, when encouraged to adjust socks while sitting EOB, pt able to adjust with figure-4 with modA for support while seated     Vision         Perception     Praxis      Pertinent Vitals/Pain Pain Assessment: No/denies pain     Hand Dominance Right   Extremity/Trunk Assessment Upper Extremity Assessment Upper Extremity Assessment: Generalized weakness   Lower Extremity Assessment Lower Extremity Assessment: Defer to PT evaluation   Cervical /  Trunk Assessment Cervical / Trunk Assessment: Kyphotic   Communication Communication Communication: No difficulties   Cognition Arousal/Alertness: Awake/alert Behavior During Therapy: Flat affect Overall Cognitive Status:  History of cognitive impairments - at baseline Area of Impairment: Orientation;Attention;Memory;Following commands;Safety/judgement;Awareness;Problem solving                 Orientation Level: Disoriented to;Place;Time;Situation Current Attention Level: Sustained Memory: Decreased short-term memory Following Commands: Follows one step commands inconsistently Safety/Judgement: Decreased awareness of deficits Awareness: Intellectual Problem Solving: Slow processing;Decreased initiation;Difficulty sequencing;Requires verbal cues General Comments: Per chart, mod-severe dementia baseline. Today, very poor short-term memory; frequent cues to stay on task as pt would quickly forget what OT asking her to do   General Comments  unable to get reliable spO2 reading (pt with all nails painted, upon entering room pt on 2lnc    Exercises     Shoulder Instructions      Home Living Family/patient expects to be discharged to:: Private residence Living Arrangements: Children Available Help at Discharge: Family;Available 24 hours/day Type of Home: House Home Access: Stairs to enter [employer] of Steps: 4 Entrance Stairs-Rails: Right Home Layout: Two level;1/2 bath on main level;Bed/bath upstairs Alternate Level Stairs-Number of Steps: 16 Alternate Level Stairs-Rails: Right Bathroom Shower/Tub: Occupational psychologist: Handicapped height Bathroom Accessibility: Yes   Home Equipment: Transport chair;Cane - single point;Walker - 2 wheels;Shower seat - built in;Hand held shower head;Grab bars - tub/shower   Additional Comments: Per chart, Pt lives with her son's family, reports someone is there 24/7. Unsure if reliable historian. Per PT note last year, pt lives on second floor of 2-story home, walk in shower, raised toilet, transport chair/RW/SPC      Prior Functioning/Environment Level of Independence: Needs assistance  Gait / Transfers Assistance Needed: Pt poor  historian. Reports using RW at home ADL's / Homemaking Assistance Needed: Pt poor historian. Reports indep with ADLs; family performs household tasks   Comments: per chart, she has close to 24/hr supervision with familiy and home sitter        OT Problem List: Decreased strength;Decreased activity tolerance;Impaired balance (sitting and/or standing);Decreased cognition;Decreased safety awareness;Cardiopulmonary status limiting activity      OT Treatment/Interventions: Self-care/ADL training;Therapeutic exercise;Therapeutic activities;Cognitive remediation/compensation;Balance training;Patient/family education    OT Goals(Current goals can be found in the care plan section) Acute Rehab OT Goals Patient Stated Goal: to get better OT Goal Formulation: With patient Time For Goal Achievement: 07/08/18 Potential to Achieve Goals: Good ADL Goals Pt Will Perform Grooming: with min assist Pt Will Perform Upper Body Bathing: with min assist Pt Will Transfer to Toilet: with min assist  OT Frequency: Min 2X/week   Barriers to D/C:            Co-evaluation              AM-PAC OT "6 Clicks" Daily Activity     Outcome Measure Help from another person eating meals?: None Help from another person taking care of personal grooming?: A Little Help from another person toileting, which includes using toliet, bedpan, or urinal?: A Lot Help from another person bathing (including washing, rinsing, drying)?: A Lot Help from another person to put on and taking off regular upper body clothing?: A Lot Help from another person to put on and taking off regular lower body clothing?: A Lot 6 Click Score: 15   End of Session Nurse Communication: Mobility status  Activity Tolerance: Patient tolerated treatment well Patient left: in bed;with call bell/phone within  reach;with bed alarm set  OT Visit Diagnosis: Unsteadiness on feet (R26.81);Muscle weakness (generalized) (M62.81);Other abnormalities of  gait and mobility (R26.89);Other symptoms and signs involving cognitive function                Time: 4709-6283 OT Time Calculation (min): 23 min Charges:  OT General Charges $OT Visit: 1 Visit OT Evaluation $OT Eval Moderate Complexity: 1 Mod OT Treatments $Self Care/Home Management : 8-22 mins  Dorinda Hill OTR/L Acute Rehabilitation Services Office: [place] 06/24/2018, 10:42 AM

## 2018-06-24 NOTE — Progress Notes (Signed)
Called report to Nia RN at  [phone]. No questions at this time. Made aware transport is scheduled for 2:30pm.

## 2018-06-24 NOTE — Progress Notes (Signed)
Daughter in law, Butch Penny, at bedside now. Copy of AVS given and advised the facility would also receive a copy. All questions answered will continue to monitor until pt is transferred to facility.

## 2018-06-24 NOTE — Progress Notes (Signed)
PT Cancellation Note  Patient Details Name: Carmen Duncan MRN: [id number] DOB: 1941/03/21   Cancelled Treatment:    Reason Eval/Treat Not Completed: Other (comment).  Pt declines because she is not feeling energetic, tired from waiting to be transported. Follow up with her if dc is not completed today.   Ramond Dial 06/24/2018, 3:06 PM  Mee Hives, PT MS Acute Rehab Dept. Number: [place] and [place]

## 2018-06-25 DIAGNOSIS — I5032 Chronic diastolic (congestive) heart failure: Secondary | ICD-10-CM | POA: Diagnosis not present

## 2018-06-25 DIAGNOSIS — J189 Pneumonia, unspecified organism: Secondary | ICD-10-CM | POA: Diagnosis not present

## 2018-06-25 DIAGNOSIS — I6389 Other cerebral infarction: Secondary | ICD-10-CM | POA: Diagnosis not present

## 2018-06-25 DIAGNOSIS — J84112 Idiopathic pulmonary fibrosis: Secondary | ICD-10-CM | POA: Diagnosis not present

## 2018-06-26 ENCOUNTER — Encounter: Payer: Self-pay | Admitting: Internal Medicine

## 2018-06-26 ENCOUNTER — Non-Acute Institutional Stay: Payer: Medicare Other | Admitting: Internal Medicine

## 2018-06-26 VITALS — BP 112/68 | HR 64 | Resp 24

## 2018-06-26 DIAGNOSIS — I10 Essential (primary) hypertension: Secondary | ICD-10-CM | POA: Diagnosis not present

## 2018-06-26 DIAGNOSIS — I509 Heart failure, unspecified: Secondary | ICD-10-CM | POA: Diagnosis not present

## 2018-06-26 DIAGNOSIS — Z515 Encounter for palliative care: Secondary | ICD-10-CM

## 2018-06-26 DIAGNOSIS — J189 Pneumonia, unspecified organism: Secondary | ICD-10-CM | POA: Diagnosis not present

## 2018-06-26 DIAGNOSIS — J84112 Idiopathic pulmonary fibrosis: Secondary | ICD-10-CM | POA: Diagnosis not present

## 2018-06-26 NOTE — Progress Notes (Signed)
Jun 26, 2018 [facility] Palliative Care Telephone: [phone] Fax: [phone]  PATIENT NAME: Carmen Duncan DOB: 03-07-1941 MRN: [id number]  Carmen Duncan [id number]  PRIMARY CARE PROVIDER:   [name], MD [address] [place],  [address]  REFERRING PROVIDER:  Dr, Wenda Low Dr. Elsworth Soho (Pulmonary)  RESPONSIBLE PARTY:  (son) Jacqulyn Cane [phone]. (D-I-L)  Lonzo Cloud (previously worked in Insurance underwriter [phone]  ASSESSMENT:    1. Recent exacerbation of functional and cognitive decline: Prior to hospital admission patient was living at the home of her son [name], his wife, and their children. Up until 2 weeks or so ago, patient able to transfer and ambulate with walker independently with a fairly steady gait. Since then Mitch reports increased generalized weakness so that she needed 1 person heavy assist to stand and 1 person steady guidance to prevent her from falling as she walked with walker. She was not using home oxygen. Here at the facility patient is confused as to place (though she was still at the hospital). Good oral intake eating 100% of her meals. She is a one person transfer and is chair bound. She is working with PT. She is on 02 @ 3LNC. She is not dyspneic with conversations.   2. Advanced Care Directives: DNR/MOST forms on the chart.  3. Goals of care :  [name] is looking for long term care (after patient completes rehab stay) as he feels he doesn't have the resources to adequately meet his mother's needs at his home, at her current level of care. [name] has been in contact with the facility business office, and they have initiated application for Medicaid.   4. Follow up: NP visit in 3-4 weeks.   I spent 60 minutes providing this consultation, from 2pm to 3pm. More than 50% of that time was spent coordinating communication, interviewing staff, chart review, and reconciling facility MAR with [facility] EMR.[name]    HISTORY OF PRESENT ILLNESS:  Carmen Duncan is a 78 y.o. 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 Hospitalization 1/27-1/29/2020 for treatment of mild URI vs atypical pneummonia. She was discharged to [facility] for rehab.  Palliative Care was asked to help address goals of care.   CODE STATUS: DNR, MOST: DNR/DNI, Limited scope of medical interventions. IVFs and antbx if indicated.   PPS: 30% HOSPICE ELIGIBILITY/DIAGNOSIS: TBD  PAST MEDICAL HISTORY:  Past Medical History:  Diagnosis Date  . Acoustic neuroma ([place])   . Anxiety   . Arthritis   . Asthma   . Brain cancer ([place])   . Breast cancer of upper-outer quadrant of right female breast ([place]) 07/19/2015  . Diabetes mellitus without complication ([place])   . Diverticulosis   . Ejection fraction   . Essential tremor    on inderal  . GERD (gastroesophageal reflux disease)   . HOH (hard of hearing) left ear  . Hyperlipidemia   . Hypertension   . IBS (irritable bowel syndrome)   . Interstitial lung disease ([place])   . Pneumonia    04-2014, [facility]  . Stroke [facility])    no deficits, on plavix    SOCIAL HX:  Social History   Tobacco Use  . Smoking status: Never Smoker  . Smokeless tobacco: Never Used  Substance Use Topics  . Alcohol use: Yes    Alcohol/week: 0.0 standard  drinks    Comment: wine about 2 times a week     ALLERGIES:  Allergies  Allergen Reactions  . Sulfa Antibiotics Rash  . Latex Itching, Rash and Other (See Comments)    Red angry skin from latex tape  . Sulfonamide Derivatives Hives and Rash  . Tape Itching, Rash and Other (See Comments)    Regular tape tears skin. Please use paper tape or coban wrap!!      PERTINENT MEDICATIONS:  Outpatient Encounter Medications as of 06/26/2018  Medication Sig  . acetaminophen (TYLENOL) 325 MG tablet Take 650 mg by mouth  every 6 (six) hours as needed for headache (pain).   [name] amLODipine (NORVASC) 2.5 MG tablet TAKE 1 TABLET BY MOUTH ONCE DAILY. (Patient taking differently: Take 2.5 mg by mouth daily. )  . atorvastatin (LIPITOR) 20 MG tablet TAKE 1 TABLET BY MOUTH ONCE DAILY (Patient taking differently: Take 20 mg by mouth daily. )  . clopidogrel (PLAVIX) 75 MG tablet Take 1 tablet (75 mg total) by mouth daily.  [name] doxycycline (VIBRA-TABS) 100 MG tablet Take 1 tablet (100 mg total) by mouth every 12 (twelve) hours for 2 days.  [name] escitalopram (LEXAPRO) 10 MG tablet Take 1 tablet (10 mg total) by mouth daily.  . ondansetron (ZOFRAN) 4 MG tablet Take 1 tablet (4 mg total) by mouth every 8 (eight) hours as needed for nausea or vomiting.  . pantoprazole (PROTONIX) 20 MG tablet Take 1 tablet (20 mg total) by mouth daily.  . propranolol (INDERAL) 20 MG tablet Take 1 tablet (20 mg total) by mouth daily.  . vitamin B-12 (CYANOCOBALAMIN) 1000 MCG tablet Take 1,000 mcg by mouth daily.   No facility-administered encounter medications on file as of 06/26/2018.     PHYSICAL EXAM:  BP 112/68, HR 64, RR24 Frail appearing elderly Caucasian female sitting up in wheelchair. She is alert and pleasantly conversant and engaging. A & O to self. Knew her DOB, thought she was still at the hospital. Mentioned before hospitalization she lived at home with her mom and dad and the children. Cardiovascular: regular rate and rhythm without MRG Pulmonary: bilateral inspiratory crackles 1/2 up c/w pulm fibrosis Abdomen: soft, nontender, + bowel sounds GU: no suprapubic tenderness Extremities: no edema, no joint deformities Skin: no rashes Neurological: Weakness but otherwise nonfocal  Julianne Handler, NP

## 2018-06-28 LAB — CULTURE, BLOOD (ROUTINE X 2)
CULTURE: NO GROWTH
Culture: NO GROWTH
SPECIAL REQUESTS: ADEQUATE
Special Requests: ADEQUATE

## 2018-07-02 DIAGNOSIS — J189 Pneumonia, unspecified organism: Secondary | ICD-10-CM | POA: Diagnosis not present

## 2018-07-02 DIAGNOSIS — I6389 Other cerebral infarction: Secondary | ICD-10-CM | POA: Diagnosis not present

## 2018-07-02 DIAGNOSIS — F0150 Vascular dementia without behavioral disturbance: Secondary | ICD-10-CM | POA: Diagnosis not present

## 2018-07-02 DIAGNOSIS — J84112 Idiopathic pulmonary fibrosis: Secondary | ICD-10-CM | POA: Diagnosis not present

## 2018-07-09 DIAGNOSIS — F0150 Vascular dementia without behavioral disturbance: Secondary | ICD-10-CM | POA: Diagnosis not present

## 2018-07-09 DIAGNOSIS — J84112 Idiopathic pulmonary fibrosis: Secondary | ICD-10-CM | POA: Diagnosis not present

## 2018-07-09 DIAGNOSIS — I6389 Other cerebral infarction: Secondary | ICD-10-CM | POA: Diagnosis not present

## 2018-07-09 DIAGNOSIS — I5032 Chronic diastolic (congestive) heart failure: Secondary | ICD-10-CM | POA: Diagnosis not present

## 2018-07-14 DIAGNOSIS — B001 Herpesviral vesicular dermatitis: Secondary | ICD-10-CM | POA: Diagnosis not present

## 2018-07-14 DIAGNOSIS — J84112 Idiopathic pulmonary fibrosis: Secondary | ICD-10-CM | POA: Diagnosis not present

## 2018-07-14 DIAGNOSIS — I6389 Other cerebral infarction: Secondary | ICD-10-CM | POA: Diagnosis not present

## 2018-07-14 DIAGNOSIS — I5032 Chronic diastolic (congestive) heart failure: Secondary | ICD-10-CM | POA: Diagnosis not present

## 2018-07-17 DIAGNOSIS — I10 Essential (primary) hypertension: Secondary | ICD-10-CM | POA: Diagnosis not present

## 2018-07-17 DIAGNOSIS — F322 Major depressive disorder, single episode, severe without psychotic features: Secondary | ICD-10-CM | POA: Diagnosis not present

## 2018-07-17 DIAGNOSIS — I5030 Unspecified diastolic (congestive) heart failure: Secondary | ICD-10-CM | POA: Diagnosis not present

## 2018-07-17 DIAGNOSIS — F0151 Vascular dementia with behavioral disturbance: Secondary | ICD-10-CM | POA: Diagnosis not present

## 2018-08-11 ENCOUNTER — Other Ambulatory Visit: Payer: Self-pay

## 2018-08-11 ENCOUNTER — Emergency Department (HOSPITAL_COMMUNITY)
Admission: EM | Admit: 2018-08-11 | Discharge: 2018-08-11 | Disposition: A | Discharge status: Home / Self Care | Payer: Medicare Other | Attending: Emergency Medicine | Admitting: Emergency Medicine

## 2018-08-11 ENCOUNTER — Encounter (HOSPITAL_COMMUNITY): Payer: Self-pay | Admitting: Emergency Medicine

## 2018-08-11 ENCOUNTER — Emergency Department (HOSPITAL_COMMUNITY): Payer: Medicare Other

## 2018-08-11 DIAGNOSIS — Z7901 Long term (current) use of anticoagulants: Secondary | ICD-10-CM | POA: Diagnosis not present

## 2018-08-11 DIAGNOSIS — E119 Type 2 diabetes mellitus without complications: Secondary | ICD-10-CM | POA: Diagnosis not present

## 2018-08-11 DIAGNOSIS — Z85841 Personal history of malignant neoplasm of brain: Secondary | ICD-10-CM | POA: Diagnosis not present

## 2018-08-11 DIAGNOSIS — Z91040 Latex allergy status: Secondary | ICD-10-CM | POA: Diagnosis not present

## 2018-08-11 DIAGNOSIS — I10 Essential (primary) hypertension: Secondary | ICD-10-CM | POA: Insufficient documentation

## 2018-08-11 DIAGNOSIS — B9789 Other viral agents as the cause of diseases classified elsewhere: Secondary | ICD-10-CM | POA: Diagnosis not present

## 2018-08-11 DIAGNOSIS — Z8673 Personal history of transient ischemic attack (TIA), and cerebral infarction without residual deficits: Secondary | ICD-10-CM | POA: Insufficient documentation

## 2018-08-11 DIAGNOSIS — Z853 Personal history of malignant neoplasm of breast: Secondary | ICD-10-CM | POA: Insufficient documentation

## 2018-08-11 DIAGNOSIS — R05 Cough: Secondary | ICD-10-CM | POA: Diagnosis not present

## 2018-08-11 DIAGNOSIS — R0602 Shortness of breath: Secondary | ICD-10-CM | POA: Diagnosis not present

## 2018-08-11 DIAGNOSIS — J189 Pneumonia, unspecified organism: Secondary | ICD-10-CM | POA: Diagnosis not present

## 2018-08-11 DIAGNOSIS — J069 Acute upper respiratory infection, unspecified: Secondary | ICD-10-CM | POA: Diagnosis not present

## 2018-08-11 DIAGNOSIS — Z79899 Other long term (current) drug therapy: Secondary | ICD-10-CM | POA: Diagnosis not present

## 2018-08-11 DIAGNOSIS — R404 Transient alteration of awareness: Secondary | ICD-10-CM | POA: Diagnosis not present

## 2018-08-11 NOTE — ED Notes (Signed)
Report called to [place] at [facility]

## 2018-08-11 NOTE — Discharge Instructions (Signed)
Follow up with your PCP. Return for worsening shortness of breath, fever, confusion

## 2018-08-11 NOTE — ED Notes (Signed)
Patient transported to X-ray 

## 2018-08-11 NOTE — ED Triage Notes (Addendum)
PT to ED from [facility] via [place].  EMS st's they were told pt woke up from a nap and had a cough.  St's due to other residents in the facility with pneumonia they thought see needed to be checked.  Pt has dementia and is at baseline per norm.  No complaints from pt.  Pt is on home 02 at 2LPM

## 2018-08-11 NOTE — ED Notes (Signed)
[facility] called to transport pt back

## 2018-08-11 NOTE — ED Provider Notes (Signed)
[facility] EMERGENCY DEPARTMENT Provider Note   CSN: [id number] Arrival date & time: 08/11/18  1925    History   Chief Complaint Chief Complaint  Patient presents with  . Cough    HPI Carmen Duncan is a 78 y.o. female.     78 yo F with a chief complaint of a cough.  Per EMS the patient woke up from a nap and had a mild cough and so was sent here by the facility in which she lives.  Patient states that she has had a cough since yesterday.  Denies fevers or chills denies shortness of breath.  She is chronically on 2 L of oxygen and this has not changed.  She denies chest pain or pressure denies abdominal pain.  Denies vomiting or diarrhea.  The history is provided by the patient.  Cough  Associated symptoms: no chest pain, no chills, no fever, no headaches, no myalgias, no rhinorrhea, no shortness of breath and no wheezing   Illness  Severity:  Moderate Onset quality:  Gradual Duration:  2 days Timing:  Constant Progression:  Worsening Chronicity:  New Associated symptoms: cough   Associated symptoms: no chest pain, no congestion, no fever, no headaches, no myalgias, no nausea, no rhinorrhea, no shortness of breath, no vomiting and no wheezing     Past Medical History:  Diagnosis Date  . Acoustic neuroma ([place])   . Anxiety   . Arthritis   . Asthma   . Brain cancer ([place])   . Breast cancer of upper-outer quadrant of right female breast ([place]) 07/19/2015  . Diabetes mellitus without complication ([place])   . Diverticulosis   . Ejection fraction   . Essential tremor    on inderal  . GERD (gastroesophageal reflux disease)   . HOH (hard of hearing) left ear  . Hyperlipidemia   . Hypertension   . IBS (irritable bowel syndrome)   . Interstitial lung disease ([place])   . Pneumonia    04-2014, CAP  . Stroke [facility])    no deficits, on plavix    Patient Active Problem List   Diagnosis Date Noted  . PNA (pneumonia) 06/22/2018  . Acute respiratory failure with  hypoxia ([place]) 06/22/2018  . Adult failure to thrive 02/09/2018  . Depression 07/28/2017  . Weak 07/24/2017  . 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 Asthma   . CVA (cerebral infarction) 11/17/2015  . Left hemiparesis ([place])   . Acute CVA (cerebrovascular accident) ([place]) 11/15/2015  . CVA (cerebral vascular accident) ([place]) 11/14/2015  . Left-sided muscle weakness 11/14/2015  . Left-sided weakness 11/14/2015  . Breast cancer of upper-outer quadrant of right female breast ([place]) 07/19/2015  . Vitamin B 12 deficiency 03/14/2015  . Bilateral carotid artery disease ([place]) 10/07/2014  . Pulmonary hypertension ([place]) 10/07/2014  . Ejection fraction   . 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 Oral candidiasis 04/28/2014  . Chronic diastolic congestive heart failure ([place]) 04/28/2014  . IPF (idiopathic pulmonary fibrosis) ([place]) 04/21/2014  . Hyponatremia 04/19/2014  . Thrombocytopenia ([place]) 04/19/2014  . Malnutrition of moderate degree ([place]) 04/19/2014  . Hyperlipidemia 04/18/2014  . Hypovolemia due to dehydration 04/18/2014  . Dyspnea on exertion 04/04/2014  . Abnormal nuclear stress test 04/04/2014  . HOH (hard of hearing)   . Raynaud's phenomenon 03/21/2014  . Tremor, essential 03/12/2013  . Hereditary and idiopathic peripheral neuropathy 08/04/2012  . Squamous cell skin cancer 08/19/2011  . 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 Dyslipidemia 04/03/2010  .  Essential hypertension 04/03/2010  . GERD 04/03/2010  . IBS 04/03/2010  . DEEP VENOUS THROMBOPHLEBITIS, HX OF 04/03/2010  . DIVERTICULITIS, HX OF 04/03/2010    Past Surgical History:  Procedure Laterality Date  . ABDOMINAL HYSTERECTOMY  1980  . BIOPSY BREAST    . BREAST LUMPECTOMY WITH RADIOACTIVE SEED AND SENTINEL LYMPH NODE BIOPSY Right 08/10/2015   Procedure: BREAST LUMPECTOMY WITH RADIOACTIVE SEED AND SENTINEL LYMPH NODE BIOPSY;  Surgeon: Autumn Messing III, MD;  Location: [facility];  Service:  General;  Laterality: Right;  . CHOLECYSTECTOMY  2011  . EP IMPLANTABLE DEVICE N/A 11/16/2015   Procedure: Loop Recorder Insertion;  Surgeon: [name], MD;  Location: [facility] CV LAB;  Service: Cardiovascular;  Laterality: N/A;  . eyes  2007   cararacts  . TEE WITHOUT CARDIOVERSION N/A 09/05/2014   Procedure: TRANSESOPHAGEAL ECHOCARDIOGRAM (TEE);  Surgeon: Lelon Perla, MD;  Location: [facility] ENDOSCOPY;  Service: Cardiovascular;  Laterality: N/A;  . TEE WITHOUT CARDIOVERSION N/A 11/16/2015   Procedure: TRANSESOPHAGEAL ECHOCARDIOGRAM (TEE);  Surgeon: Larey Dresser, MD;  Location: [facility];  Service: Cardiovascular;  Laterality: N/A;  . TONSILLECTOMY  1945     OB History   No obstetric history on file.      Home Medications    Prior to Admission medications   Medication Sig Start Date End Date Taking? Authorizing Provider  acetaminophen (TYLENOL) 325 MG tablet Take 650 mg by mouth every 6 (six) hours as needed for headache (pain).     [provider]  amLODipine (NORVASC) 2.5 MG tablet TAKE 1 TABLET BY MOUTH ONCE DAILY. Patient taking differently: Take 2.5 mg by mouth daily.  05/25/18   Burchette, Alinda Sierras, MD  atorvastatin (LIPITOR) 20 MG tablet TAKE 1 TABLET BY MOUTH ONCE DAILY Patient taking differently: Take 20 mg by mouth daily.  03/02/18   Burchette, Alinda Sierras, MD  clopidogrel (PLAVIX) 75 MG tablet Take 1 tablet (75 mg total) by mouth daily. 05/12/18   Tat, Eustace Quail, DO  escitalopram (LEXAPRO) 10 MG tablet Take 1 tablet (10 mg total) by mouth daily. 05/12/18   Tat, Eustace Quail, DO  ondansetron (ZOFRAN) 4 MG tablet Take 1 tablet (4 mg total) by mouth every 8 (eight) hours as needed for nausea or vomiting. 03/29/16   Rigoberto Noel, MD  pantoprazole (PROTONIX) 20 MG tablet Take 1 tablet (20 mg total) by mouth daily. 05/13/18   Burchette, Alinda Sierras, MD  propranolol (INDERAL) 20 MG tablet Take 1 tablet (20 mg total) by mouth daily. 05/12/18   Tat, Eustace Quail, DO  vitamin  B-12 (CYANOCOBALAMIN) 1000 MCG tablet Take 1,000 mcg by mouth daily.    [provider]    Family History Family History  Problem Relation Age of Onset  . Hyperlipidemia Mother   . Hypertension Mother   . Hyperlipidemia Father   . Heart disease Father 41  . Stroke Father   . Lung cancer Paternal Uncle        x 4    Social History Social History   Tobacco Use  . Smoking status: Never Smoker  . Smokeless tobacco: Never Used  Substance Use Topics  . Alcohol use: Yes    Alcohol/week: 0.0 standard drinks    Comment: wine about 2 times a week   . Drug use: No     Allergies   Sulfa antibiotics; Latex; Sulfonamide derivatives; and Tape   Review of Systems Review of Systems  Constitutional: Negative for chills and  fever.  HENT: Negative for congestion and rhinorrhea.   Eyes: Negative for redness and visual disturbance.  Respiratory: Positive for cough. Negative for shortness of breath and wheezing.   Cardiovascular: Negative for chest pain and palpitations.  Gastrointestinal: Negative for nausea and vomiting.  Genitourinary: Negative for dysuria and urgency.  Musculoskeletal: Negative for arthralgias and myalgias.  Skin: Negative for pallor and wound.  Neurological: Negative for dizziness and headaches.     Physical Exam Updated Vital Signs BP (!) 139/52   Pulse 91   Temp 98.3 F (36.8 C) (Oral)   Resp 18   SpO2 100%   Physical Exam Vitals signs and nursing note reviewed.  Constitutional:      General: She is not in acute distress.    Appearance: She is well-developed. She is not diaphoretic.     Comments: Cachectic  HENT:     Head: Normocephalic and atraumatic.  Eyes:     Pupils: Pupils are equal, round, and reactive to light.  Neck:     Musculoskeletal: Normal range of motion and neck supple.  Cardiovascular:     Rate and Rhythm: Normal rate and regular rhythm.     Heart sounds: No murmur. No friction rub. No gallop.   Pulmonary:     Breath  sounds: No wheezing or rales.     Comments: Mildly decreased respiratory effort Abdominal:     General: There is no distension.     Palpations: Abdomen is soft.     Tenderness: There is no abdominal tenderness.  Musculoskeletal:        General: No tenderness.  Skin:    General: Skin is warm and dry.  Neurological:     Mental Status: She is alert and oriented to person, place, and time.  Psychiatric:        Behavior: Behavior normal.      ED Treatments / Results  Labs (all labs ordered are listed, but only abnormal results are displayed) Labs Reviewed - No data to display  EKG None  Radiology Dg Chest 2 View  Result Date: 08/11/2018 CLINICAL DATA:  Cough and shortness of breath EXAM: CHEST - 2 VIEW COMPARISON:  06/22/2018, 07/24/2017, CT 09/04/2015 FINDINGS: Bilateral pulmonary fibrosis. Increased interstitial and alveolar disease at the bases with left upper lobe peripheral nodularity. No pleural effusion. Stable cardiomediastinal silhouette. No pneumothorax. IMPRESSION: Suspected acute on chronic interstitial disease at the bilateral lung bases. Borderline to mild cardiomegaly. Electronically Signed   By: Donavan Foil M.D.   On: 08/11/2018 21:13    Procedures Procedures (including critical care time)  Medications Ordered in ED Medications - No data to display   Initial Impression / Assessment and Plan / ED Course  I have reviewed the triage vital signs and the nursing notes.  Pertinent labs & imaging results that were available during my care of the patient were reviewed by me and considered in my medical decision making (see chart for details).        78 yo F with a chief complaint of cough.  Going on since yesterday.  Patient is having no difficulty breathing she is on 2 L of oxygen which is a chronic setting for her.  Based on her exam and history I do not feel that she needs to be admitted.  Chest x-ray viewed by me without focal infiltrates.  Radiology read for  worsening interstitial disease.  We will have her follow-up with her PCP.  10:21 PM:  I have discussed the diagnosis/risks/treatment  options with the patient and believe the pt to be eligible for discharge home to follow-up with PCP. We also discussed returning to the ED immediately if new or worsening sx occur. We discussed the sx which are most concerning (e.g., sudden worsening sob, fever, inability to tolerate by mouth) that necessitate immediate return. Medications administered to the patient during their visit and any new prescriptions provided to the patient are listed below.  Medications given during this visit Medications - No data to display   The patient appears reasonably screen and/or stabilized for discharge and I doubt any other medical condition or other [facility] requiring further screening, evaluation, or treatment in the ED at this time prior to discharge.    Final Clinical Impressions(s) / ED Diagnoses   Final diagnoses:  Viral URI with cough    ED Discharge Orders    None       Deno Etienne, DO 08/11/18 2221

## 2018-08-12 DIAGNOSIS — R05 Cough: Secondary | ICD-10-CM | POA: Diagnosis not present

## 2018-08-12 DIAGNOSIS — I5032 Chronic diastolic (congestive) heart failure: Secondary | ICD-10-CM | POA: Diagnosis not present

## 2018-08-12 DIAGNOSIS — J069 Acute upper respiratory infection, unspecified: Secondary | ICD-10-CM | POA: Diagnosis not present

## 2018-08-12 DIAGNOSIS — J84112 Idiopathic pulmonary fibrosis: Secondary | ICD-10-CM | POA: Diagnosis not present

## 2018-08-14 DIAGNOSIS — I10 Essential (primary) hypertension: Secondary | ICD-10-CM | POA: Diagnosis not present

## 2018-08-14 DIAGNOSIS — F322 Major depressive disorder, single episode, severe without psychotic features: Secondary | ICD-10-CM | POA: Diagnosis not present

## 2018-08-14 DIAGNOSIS — J8410 Pulmonary fibrosis, unspecified: Secondary | ICD-10-CM | POA: Diagnosis not present

## 2018-08-14 DIAGNOSIS — I5030 Unspecified diastolic (congestive) heart failure: Secondary | ICD-10-CM | POA: Diagnosis not present

## 2018-08-17 DIAGNOSIS — J84112 Idiopathic pulmonary fibrosis: Secondary | ICD-10-CM | POA: Diagnosis not present

## 2018-08-17 DIAGNOSIS — R05 Cough: Secondary | ICD-10-CM | POA: Diagnosis not present

## 2018-08-17 DIAGNOSIS — J069 Acute upper respiratory infection, unspecified: Secondary | ICD-10-CM | POA: Diagnosis not present

## 2018-08-17 DIAGNOSIS — I6389 Other cerebral infarction: Secondary | ICD-10-CM | POA: Diagnosis not present

## 2018-08-18 ENCOUNTER — Other Ambulatory Visit: Payer: Self-pay

## 2018-08-18 ENCOUNTER — Encounter: Payer: Self-pay | Admitting: Internal Medicine

## 2018-08-18 ENCOUNTER — Non-Acute Institutional Stay: Payer: Medicare Other | Admitting: Internal Medicine

## 2018-08-18 VITALS — BP 120/66 | HR 80 | Resp 16 | Ht 62.0 in | Wt 102.6 lb

## 2018-08-18 DIAGNOSIS — Z515 Encounter for palliative care: Secondary | ICD-10-CM

## 2018-08-18 NOTE — Progress Notes (Addendum)
March 24th, 2020 [facility] Jun 26, 2018 [facility] Palliative Care Telephone: [phone] Fax: [phone]  PATIENT NAME: Carmen Duncan DOB: [birth year] MRN: [id number]  Carmen Duncan [id number]  PRIMARY CARE PROVIDER:   [name], MD [address] [place], [place] [address]  REFERRING PROVIDER:  Dr, Wenda Low Dr. Elsworth Soho (Pulmonary)  RESPONSIBLE PARTY:  (son) Jacqulyn Cane [phone]. (D-I-L)  Lonzo Cloud (previously worked in Insurance underwriter [phone], [phone]  ASSESSMENT:    1. Functional and cognitive decline; weight loss: Since prior evaluation patient transferred from skilled rehab to long term care. She is oriented to person only. Believes roommate is her sister. Confusion waxes and wanes. Generalized weakness. One person pivot transfer; able to weight bear but not to ambulate.  No falls. She is dependent for hygiene and dressing. She is incontinent of bowel and bladder. She is on O2 at 2 LPM; weaning down as tolerated to keep sats > 90%. She has a recent ER eval for viral URI (cough and desaturations) and is now at baseline. Staff report decreased oral intake, eating 25% of meals. Weight (08/06/2018) is 102.6 lbs, which is a weight loss of 24 lbs (19% of her body weight) over the last 2 months. At 5'1" her BMI is 19.5 kg/m2. On 06/25/18 her Albumin was 3.1. Medipass was started bid. ST evaluation d/t coughing with liquids.  2. Advanced Care Directives: DNR/MOST forms on the chart.  3. Goals of care : Comfort. Spoke with son [name] and D-I-L Butch Penny with visit updates. [name] is a Software engineer with a hospice agency. He mentioned that patient's Inderal is also for control of tremors, and requests that it not be stopped, if at all possible. We discussed prognosis, and that if patient trajectory continues with ongoing functional decline and weight loss, that patient could be eligible for hospice services in the future.  Family is open to that option.  4. Follow up: NP visit in 1-2 months. Mitch requests I follow patient on a monthly basis. I told him I would try to keep to that schedule.  I spent 60 minutes providing this consultation, from 10:30ampm to 11:30pm. More than 50% of that time was spent coordinating communication, interviewing staff and family, chart review, and reconciling facility MAR with [facility] EMR.   HISTORY OF PRESENT ILLNESS:  Carmen Duncan is a 78 y.o. 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 Hospitalization 1/27-1/29/2020 for treatment of mild URI vs atypical pneummonia. She was discharged to [facility] for rehab, and has since transitioned to long term care. Pt was sent to the ER for symptoms of viral URI with cough. This is a f/u Palliative Care visit from 06/26/2018.   CODE STATUS: DNR, MOST: DNR/DNI, Limited scope of medical interventions. IVFs and antbx if indicated.   PPS: 30% HOSPICE ELIGIBILITY/DIAGNOSIS: TBD  PAST MEDICAL HISTORY:  Past Medical History:  Diagnosis Date   Acoustic neuroma ([place])    Anxiety    Arthritis    Asthma    Brain cancer ([place])    Breast cancer of upper-outer quadrant of right female breast ([place]) 07/19/2015   Diabetes mellitus without complication ([facility])    Diverticulosis    Ejection fraction    Essential tremor    on inderal   GERD (gastroesophageal reflux disease)    HOH (hard of hearing) left  ear   Hyperlipidemia    Hypertension    IBS (irritable bowel syndrome)    Interstitial lung disease ([place])    Pneumonia    04-2014, CAP   Stroke ([place])    no deficits, on plavix    SOCIAL HX:  Social History   Tobacco Use   Smoking status: Never Smoker   Smokeless tobacco: Never Used  Substance Use Topics   Alcohol use: Yes    Alcohol/week: 0.0 standard drinks    Comment:  wine about 2 times a week     ALLERGIES:  Allergies  Allergen Reactions   Sulfa Antibiotics Rash   Latex Itching, Rash and Other (See Comments)    Red angry skin from latex tape   Sulfonamide Derivatives Hives and Rash   Tape Itching, Rash and Other (See Comments)    Regular tape tears skin. Please use paper tape or coban wrap!!      PERTINENT MEDICATIONS:  Outpatient Encounter Medications as of 08/18/2018  Medication Sig   acetaminophen (TYLENOL) 325 MG tablet Take 650 mg by mouth every 6 (six) hours as needed for headache (pain).    amLODipine (NORVASC) 2.5 MG tablet TAKE 1 TABLET BY MOUTH ONCE DAILY. (Patient taking differently: Take 2.5 mg by mouth daily. )   atorvastatin (LIPITOR) 20 MG tablet TAKE 1 TABLET BY MOUTH ONCE DAILY (Patient taking differently: Take 20 mg by mouth daily. )   clopidogrel (PLAVIX) 75 MG tablet Take 1 tablet (75 mg total) by mouth daily.   escitalopram (LEXAPRO) 10 MG tablet Take 1 tablet (10 mg total) by mouth daily.   ondansetron (ZOFRAN) 4 MG tablet Take 1 tablet (4 mg total) by mouth every 8 (eight) hours as needed for nausea or vomiting.   pantoprazole (PROTONIX) 20 MG tablet Take 1 tablet (20 mg total) by mouth daily.   propranolol (INDERAL) 20 MG tablet Take 1 tablet (20 mg total) by mouth daily.   vitamin B-12 (CYANOCOBALAMIN) 1000 MCG tablet Take 1,000 mcg by mouth daily.   No facility-administered encounter medications on file as of 08/18/2018.     PHYSICAL EXAM:  BP 122/60, HR 80, RR 16 General: NAD, frail appearing, thin, sitting up and dozing in her wheelchair. She awoke easily to my voice. She has a sweet affect and is able to follow simple commands. Cardiovascular: regular rate and rhythm Pulmonary: posterior insp crackles; diffuse Abdomen: soft, nontender, + bowel sounds Extremities: no edema, no joint deformities Skin: no rashes Neurological: Weakness but otherwise nonfocal  Julianne Handler, NP

## 2018-08-24 DIAGNOSIS — J84112 Idiopathic pulmonary fibrosis: Secondary | ICD-10-CM | POA: Diagnosis not present

## 2018-08-24 DIAGNOSIS — R062 Wheezing: Secondary | ICD-10-CM | POA: Diagnosis not present

## 2018-08-24 DIAGNOSIS — J069 Acute upper respiratory infection, unspecified: Secondary | ICD-10-CM | POA: Diagnosis not present

## 2018-08-24 DIAGNOSIS — I5032 Chronic diastolic (congestive) heart failure: Secondary | ICD-10-CM | POA: Diagnosis not present

## 2018-08-25 DIAGNOSIS — F0150 Vascular dementia without behavioral disturbance: Secondary | ICD-10-CM | POA: Diagnosis not present

## 2018-08-25 DIAGNOSIS — I69391 Dysphagia following cerebral infarction: Secondary | ICD-10-CM | POA: Diagnosis not present

## 2018-08-25 DIAGNOSIS — R278 Other lack of coordination: Secondary | ICD-10-CM | POA: Diagnosis not present

## 2018-08-25 DIAGNOSIS — R1312 Dysphagia, oropharyngeal phase: Secondary | ICD-10-CM | POA: Diagnosis not present

## 2018-08-25 DIAGNOSIS — J84112 Idiopathic pulmonary fibrosis: Secondary | ICD-10-CM | POA: Diagnosis not present

## 2018-08-25 DIAGNOSIS — J9601 Acute respiratory failure with hypoxia: Secondary | ICD-10-CM | POA: Diagnosis not present

## 2018-08-25 DIAGNOSIS — I5032 Chronic diastolic (congestive) heart failure: Secondary | ICD-10-CM | POA: Diagnosis not present

## 2018-08-25 DIAGNOSIS — I110 Hypertensive heart disease with heart failure: Secondary | ICD-10-CM | POA: Diagnosis not present

## 2018-08-25 DIAGNOSIS — E785 Hyperlipidemia, unspecified: Secondary | ICD-10-CM | POA: Diagnosis not present

## 2018-08-25 DIAGNOSIS — R41841 Cognitive communication deficit: Secondary | ICD-10-CM | POA: Diagnosis not present

## 2018-08-25 DIAGNOSIS — K219 Gastro-esophageal reflux disease without esophagitis: Secondary | ICD-10-CM | POA: Diagnosis not present

## 2018-08-25 DIAGNOSIS — F329 Major depressive disorder, single episode, unspecified: Secondary | ICD-10-CM | POA: Diagnosis not present

## 2018-08-25 DIAGNOSIS — R2689 Other abnormalities of gait and mobility: Secondary | ICD-10-CM | POA: Diagnosis not present

## 2018-08-27 DIAGNOSIS — I69391 Dysphagia following cerebral infarction: Secondary | ICD-10-CM | POA: Diagnosis not present

## 2018-08-27 DIAGNOSIS — J988 Other specified respiratory disorders: Secondary | ICD-10-CM | POA: Diagnosis not present

## 2018-08-27 DIAGNOSIS — J84112 Idiopathic pulmonary fibrosis: Secondary | ICD-10-CM | POA: Diagnosis not present

## 2018-08-27 DIAGNOSIS — I110 Hypertensive heart disease with heart failure: Secondary | ICD-10-CM | POA: Diagnosis not present

## 2018-08-27 DIAGNOSIS — R2689 Other abnormalities of gait and mobility: Secondary | ICD-10-CM | POA: Diagnosis not present

## 2018-08-27 DIAGNOSIS — R278 Other lack of coordination: Secondary | ICD-10-CM | POA: Diagnosis not present

## 2018-08-27 DIAGNOSIS — R41841 Cognitive communication deficit: Secondary | ICD-10-CM | POA: Diagnosis not present

## 2018-08-27 DIAGNOSIS — F329 Major depressive disorder, single episode, unspecified: Secondary | ICD-10-CM | POA: Diagnosis not present

## 2018-08-27 DIAGNOSIS — K219 Gastro-esophageal reflux disease without esophagitis: Secondary | ICD-10-CM | POA: Diagnosis not present

## 2018-08-27 DIAGNOSIS — I5032 Chronic diastolic (congestive) heart failure: Secondary | ICD-10-CM | POA: Diagnosis not present

## 2018-08-27 DIAGNOSIS — D649 Anemia, unspecified: Secondary | ICD-10-CM | POA: Diagnosis not present

## 2018-08-27 DIAGNOSIS — R1312 Dysphagia, oropharyngeal phase: Secondary | ICD-10-CM | POA: Diagnosis not present

## 2018-08-27 DIAGNOSIS — Z79899 Other long term (current) drug therapy: Secondary | ICD-10-CM | POA: Diagnosis not present

## 2018-08-27 DIAGNOSIS — R627 Adult failure to thrive: Secondary | ICD-10-CM | POA: Diagnosis not present

## 2018-08-27 DIAGNOSIS — J9601 Acute respiratory failure with hypoxia: Secondary | ICD-10-CM | POA: Diagnosis not present

## 2018-08-27 DIAGNOSIS — E785 Hyperlipidemia, unspecified: Secondary | ICD-10-CM | POA: Diagnosis not present

## 2018-08-27 DIAGNOSIS — I6389 Other cerebral infarction: Secondary | ICD-10-CM | POA: Diagnosis not present

## 2018-08-27 DIAGNOSIS — F0150 Vascular dementia without behavioral disturbance: Secondary | ICD-10-CM | POA: Diagnosis not present

## 2018-08-27 DIAGNOSIS — R5381 Other malaise: Secondary | ICD-10-CM | POA: Diagnosis not present

## 2018-08-28 DIAGNOSIS — J189 Pneumonia, unspecified organism: Secondary | ICD-10-CM | POA: Diagnosis not present

## 2018-08-28 DIAGNOSIS — R278 Other lack of coordination: Secondary | ICD-10-CM | POA: Diagnosis not present

## 2018-08-28 DIAGNOSIS — R41841 Cognitive communication deficit: Secondary | ICD-10-CM | POA: Diagnosis not present

## 2018-08-28 DIAGNOSIS — I6389 Other cerebral infarction: Secondary | ICD-10-CM | POA: Diagnosis not present

## 2018-08-28 DIAGNOSIS — J988 Other specified respiratory disorders: Secondary | ICD-10-CM | POA: Diagnosis not present

## 2018-08-28 DIAGNOSIS — I5032 Chronic diastolic (congestive) heart failure: Secondary | ICD-10-CM | POA: Diagnosis not present

## 2018-08-28 DIAGNOSIS — E785 Hyperlipidemia, unspecified: Secondary | ICD-10-CM | POA: Diagnosis not present

## 2018-08-28 DIAGNOSIS — R1312 Dysphagia, oropharyngeal phase: Secondary | ICD-10-CM | POA: Diagnosis not present

## 2018-08-28 DIAGNOSIS — F0150 Vascular dementia without behavioral disturbance: Secondary | ICD-10-CM | POA: Diagnosis not present

## 2018-08-28 DIAGNOSIS — K219 Gastro-esophageal reflux disease without esophagitis: Secondary | ICD-10-CM | POA: Diagnosis not present

## 2018-08-28 DIAGNOSIS — J84112 Idiopathic pulmonary fibrosis: Secondary | ICD-10-CM | POA: Diagnosis not present

## 2018-08-28 DIAGNOSIS — I69391 Dysphagia following cerebral infarction: Secondary | ICD-10-CM | POA: Diagnosis not present

## 2018-08-28 DIAGNOSIS — J9601 Acute respiratory failure with hypoxia: Secondary | ICD-10-CM | POA: Diagnosis not present

## 2018-08-28 DIAGNOSIS — R2689 Other abnormalities of gait and mobility: Secondary | ICD-10-CM | POA: Diagnosis not present

## 2018-08-28 DIAGNOSIS — F329 Major depressive disorder, single episode, unspecified: Secondary | ICD-10-CM | POA: Diagnosis not present

## 2018-08-28 DIAGNOSIS — I110 Hypertensive heart disease with heart failure: Secondary | ICD-10-CM | POA: Diagnosis not present

## 2018-08-30 DIAGNOSIS — R627 Adult failure to thrive: Secondary | ICD-10-CM | POA: Diagnosis not present

## 2018-08-30 DIAGNOSIS — R062 Wheezing: Secondary | ICD-10-CM | POA: Diagnosis not present

## 2018-08-30 DIAGNOSIS — R5381 Other malaise: Secondary | ICD-10-CM | POA: Diagnosis not present

## 2018-08-30 DIAGNOSIS — J189 Pneumonia, unspecified organism: Secondary | ICD-10-CM | POA: Diagnosis not present

## 2018-08-31 DIAGNOSIS — I69391 Dysphagia following cerebral infarction: Secondary | ICD-10-CM | POA: Diagnosis not present

## 2018-08-31 DIAGNOSIS — I6389 Other cerebral infarction: Secondary | ICD-10-CM | POA: Diagnosis not present

## 2018-08-31 DIAGNOSIS — J84112 Idiopathic pulmonary fibrosis: Secondary | ICD-10-CM | POA: Diagnosis not present

## 2018-08-31 DIAGNOSIS — F329 Major depressive disorder, single episode, unspecified: Secondary | ICD-10-CM | POA: Diagnosis not present

## 2018-08-31 DIAGNOSIS — J9601 Acute respiratory failure with hypoxia: Secondary | ICD-10-CM | POA: Diagnosis not present

## 2018-08-31 DIAGNOSIS — I5032 Chronic diastolic (congestive) heart failure: Secondary | ICD-10-CM | POA: Diagnosis not present

## 2018-08-31 DIAGNOSIS — K219 Gastro-esophageal reflux disease without esophagitis: Secondary | ICD-10-CM | POA: Diagnosis not present

## 2018-08-31 DIAGNOSIS — J189 Pneumonia, unspecified organism: Secondary | ICD-10-CM | POA: Diagnosis not present

## 2018-08-31 DIAGNOSIS — R1312 Dysphagia, oropharyngeal phase: Secondary | ICD-10-CM | POA: Diagnosis not present

## 2018-08-31 DIAGNOSIS — R2689 Other abnormalities of gait and mobility: Secondary | ICD-10-CM | POA: Diagnosis not present

## 2018-08-31 DIAGNOSIS — J988 Other specified respiratory disorders: Secondary | ICD-10-CM | POA: Diagnosis not present

## 2018-08-31 DIAGNOSIS — R278 Other lack of coordination: Secondary | ICD-10-CM | POA: Diagnosis not present

## 2018-08-31 DIAGNOSIS — F0150 Vascular dementia without behavioral disturbance: Secondary | ICD-10-CM | POA: Diagnosis not present

## 2018-08-31 DIAGNOSIS — R41841 Cognitive communication deficit: Secondary | ICD-10-CM | POA: Diagnosis not present

## 2018-08-31 DIAGNOSIS — I110 Hypertensive heart disease with heart failure: Secondary | ICD-10-CM | POA: Diagnosis not present

## 2018-08-31 DIAGNOSIS — E785 Hyperlipidemia, unspecified: Secondary | ICD-10-CM | POA: Diagnosis not present

## 2018-09-01 DIAGNOSIS — F0150 Vascular dementia without behavioral disturbance: Secondary | ICD-10-CM | POA: Diagnosis not present

## 2018-09-01 DIAGNOSIS — R278 Other lack of coordination: Secondary | ICD-10-CM | POA: Diagnosis not present

## 2018-09-01 DIAGNOSIS — E785 Hyperlipidemia, unspecified: Secondary | ICD-10-CM | POA: Diagnosis not present

## 2018-09-01 DIAGNOSIS — K219 Gastro-esophageal reflux disease without esophagitis: Secondary | ICD-10-CM | POA: Diagnosis not present

## 2018-09-01 DIAGNOSIS — I5032 Chronic diastolic (congestive) heart failure: Secondary | ICD-10-CM | POA: Diagnosis not present

## 2018-09-01 DIAGNOSIS — J84112 Idiopathic pulmonary fibrosis: Secondary | ICD-10-CM | POA: Diagnosis not present

## 2018-09-01 DIAGNOSIS — I110 Hypertensive heart disease with heart failure: Secondary | ICD-10-CM | POA: Diagnosis not present

## 2018-09-01 DIAGNOSIS — R41841 Cognitive communication deficit: Secondary | ICD-10-CM | POA: Diagnosis not present

## 2018-09-01 DIAGNOSIS — J988 Other specified respiratory disorders: Secondary | ICD-10-CM | POA: Diagnosis not present

## 2018-09-01 DIAGNOSIS — R1312 Dysphagia, oropharyngeal phase: Secondary | ICD-10-CM | POA: Diagnosis not present

## 2018-09-01 DIAGNOSIS — I69391 Dysphagia following cerebral infarction: Secondary | ICD-10-CM | POA: Diagnosis not present

## 2018-09-01 DIAGNOSIS — F329 Major depressive disorder, single episode, unspecified: Secondary | ICD-10-CM | POA: Diagnosis not present

## 2018-09-01 DIAGNOSIS — J9601 Acute respiratory failure with hypoxia: Secondary | ICD-10-CM | POA: Diagnosis not present

## 2018-09-01 DIAGNOSIS — R2689 Other abnormalities of gait and mobility: Secondary | ICD-10-CM | POA: Diagnosis not present

## 2018-09-02 DIAGNOSIS — J988 Other specified respiratory disorders: Secondary | ICD-10-CM | POA: Diagnosis not present

## 2018-09-02 DIAGNOSIS — F0150 Vascular dementia without behavioral disturbance: Secondary | ICD-10-CM | POA: Diagnosis not present

## 2018-09-02 DIAGNOSIS — I6389 Other cerebral infarction: Secondary | ICD-10-CM | POA: Diagnosis not present

## 2018-09-02 DIAGNOSIS — R278 Other lack of coordination: Secondary | ICD-10-CM | POA: Diagnosis not present

## 2018-09-02 DIAGNOSIS — R627 Adult failure to thrive: Secondary | ICD-10-CM | POA: Diagnosis not present

## 2018-09-02 DIAGNOSIS — R41841 Cognitive communication deficit: Secondary | ICD-10-CM | POA: Diagnosis not present

## 2018-09-02 DIAGNOSIS — I110 Hypertensive heart disease with heart failure: Secondary | ICD-10-CM | POA: Diagnosis not present

## 2018-09-02 DIAGNOSIS — F329 Major depressive disorder, single episode, unspecified: Secondary | ICD-10-CM | POA: Diagnosis not present

## 2018-09-02 DIAGNOSIS — E785 Hyperlipidemia, unspecified: Secondary | ICD-10-CM | POA: Diagnosis not present

## 2018-09-02 DIAGNOSIS — J84112 Idiopathic pulmonary fibrosis: Secondary | ICD-10-CM | POA: Diagnosis not present

## 2018-09-02 DIAGNOSIS — I5032 Chronic diastolic (congestive) heart failure: Secondary | ICD-10-CM | POA: Diagnosis not present

## 2018-09-02 DIAGNOSIS — R634 Abnormal weight loss: Secondary | ICD-10-CM | POA: Diagnosis not present

## 2018-09-02 DIAGNOSIS — J189 Pneumonia, unspecified organism: Secondary | ICD-10-CM | POA: Diagnosis not present

## 2018-09-02 DIAGNOSIS — I69391 Dysphagia following cerebral infarction: Secondary | ICD-10-CM | POA: Diagnosis not present

## 2018-09-02 DIAGNOSIS — K219 Gastro-esophageal reflux disease without esophagitis: Secondary | ICD-10-CM | POA: Diagnosis not present

## 2018-09-02 DIAGNOSIS — R1312 Dysphagia, oropharyngeal phase: Secondary | ICD-10-CM | POA: Diagnosis not present

## 2018-09-02 DIAGNOSIS — J9601 Acute respiratory failure with hypoxia: Secondary | ICD-10-CM | POA: Diagnosis not present

## 2018-09-02 DIAGNOSIS — R2689 Other abnormalities of gait and mobility: Secondary | ICD-10-CM | POA: Diagnosis not present

## 2018-09-03 DIAGNOSIS — J988 Other specified respiratory disorders: Secondary | ICD-10-CM | POA: Diagnosis not present

## 2018-09-03 DIAGNOSIS — R634 Abnormal weight loss: Secondary | ICD-10-CM | POA: Diagnosis not present

## 2018-09-03 DIAGNOSIS — R627 Adult failure to thrive: Secondary | ICD-10-CM | POA: Diagnosis not present

## 2018-09-03 DIAGNOSIS — J189 Pneumonia, unspecified organism: Secondary | ICD-10-CM | POA: Diagnosis not present

## 2018-09-07 DIAGNOSIS — J189 Pneumonia, unspecified organism: Secondary | ICD-10-CM | POA: Diagnosis not present

## 2018-09-07 DIAGNOSIS — I6389 Other cerebral infarction: Secondary | ICD-10-CM | POA: Diagnosis not present

## 2018-09-07 DIAGNOSIS — J84112 Idiopathic pulmonary fibrosis: Secondary | ICD-10-CM | POA: Diagnosis not present

## 2018-09-07 DIAGNOSIS — I5032 Chronic diastolic (congestive) heart failure: Secondary | ICD-10-CM | POA: Diagnosis not present

## 2018-09-10 DIAGNOSIS — J189 Pneumonia, unspecified organism: Secondary | ICD-10-CM | POA: Diagnosis not present

## 2018-09-10 DIAGNOSIS — R634 Abnormal weight loss: Secondary | ICD-10-CM | POA: Diagnosis not present

## 2018-09-10 DIAGNOSIS — J84112 Idiopathic pulmonary fibrosis: Secondary | ICD-10-CM | POA: Diagnosis not present

## 2018-09-10 DIAGNOSIS — R627 Adult failure to thrive: Secondary | ICD-10-CM | POA: Diagnosis not present

## 2018-09-11 DIAGNOSIS — I10 Essential (primary) hypertension: Secondary | ICD-10-CM | POA: Diagnosis not present

## 2018-09-11 DIAGNOSIS — F322 Major depressive disorder, single episode, severe without psychotic features: Secondary | ICD-10-CM | POA: Diagnosis not present

## 2018-09-11 DIAGNOSIS — R41841 Cognitive communication deficit: Secondary | ICD-10-CM | POA: Diagnosis not present

## 2018-09-11 DIAGNOSIS — R2689 Other abnormalities of gait and mobility: Secondary | ICD-10-CM | POA: Diagnosis not present

## 2018-09-11 DIAGNOSIS — E785 Hyperlipidemia, unspecified: Secondary | ICD-10-CM | POA: Diagnosis not present

## 2018-09-11 DIAGNOSIS — J84112 Idiopathic pulmonary fibrosis: Secondary | ICD-10-CM | POA: Diagnosis not present

## 2018-09-11 DIAGNOSIS — I110 Hypertensive heart disease with heart failure: Secondary | ICD-10-CM | POA: Diagnosis not present

## 2018-09-11 DIAGNOSIS — F0150 Vascular dementia without behavioral disturbance: Secondary | ICD-10-CM | POA: Diagnosis not present

## 2018-09-11 DIAGNOSIS — J988 Other specified respiratory disorders: Secondary | ICD-10-CM | POA: Diagnosis not present

## 2018-09-11 DIAGNOSIS — I5030 Unspecified diastolic (congestive) heart failure: Secondary | ICD-10-CM | POA: Diagnosis not present

## 2018-09-11 DIAGNOSIS — K219 Gastro-esophageal reflux disease without esophagitis: Secondary | ICD-10-CM | POA: Diagnosis not present

## 2018-09-11 DIAGNOSIS — R278 Other lack of coordination: Secondary | ICD-10-CM | POA: Diagnosis not present

## 2018-09-11 DIAGNOSIS — F329 Major depressive disorder, single episode, unspecified: Secondary | ICD-10-CM | POA: Diagnosis not present

## 2018-09-11 DIAGNOSIS — I69391 Dysphagia following cerebral infarction: Secondary | ICD-10-CM | POA: Diagnosis not present

## 2018-09-11 DIAGNOSIS — R1312 Dysphagia, oropharyngeal phase: Secondary | ICD-10-CM | POA: Diagnosis not present

## 2018-09-11 DIAGNOSIS — J9601 Acute respiratory failure with hypoxia: Secondary | ICD-10-CM | POA: Diagnosis not present

## 2018-09-11 DIAGNOSIS — I5032 Chronic diastolic (congestive) heart failure: Secondary | ICD-10-CM | POA: Diagnosis not present

## 2018-09-11 DIAGNOSIS — F0151 Vascular dementia with behavioral disturbance: Secondary | ICD-10-CM | POA: Diagnosis not present

## 2018-09-12 DIAGNOSIS — R278 Other lack of coordination: Secondary | ICD-10-CM | POA: Diagnosis not present

## 2018-09-12 DIAGNOSIS — F329 Major depressive disorder, single episode, unspecified: Secondary | ICD-10-CM | POA: Diagnosis not present

## 2018-09-12 DIAGNOSIS — K219 Gastro-esophageal reflux disease without esophagitis: Secondary | ICD-10-CM | POA: Diagnosis not present

## 2018-09-12 DIAGNOSIS — E785 Hyperlipidemia, unspecified: Secondary | ICD-10-CM | POA: Diagnosis not present

## 2018-09-12 DIAGNOSIS — J988 Other specified respiratory disorders: Secondary | ICD-10-CM | POA: Diagnosis not present

## 2018-09-12 DIAGNOSIS — J84112 Idiopathic pulmonary fibrosis: Secondary | ICD-10-CM | POA: Diagnosis not present

## 2018-09-12 DIAGNOSIS — R1312 Dysphagia, oropharyngeal phase: Secondary | ICD-10-CM | POA: Diagnosis not present

## 2018-09-12 DIAGNOSIS — I110 Hypertensive heart disease with heart failure: Secondary | ICD-10-CM | POA: Diagnosis not present

## 2018-09-12 DIAGNOSIS — R41841 Cognitive communication deficit: Secondary | ICD-10-CM | POA: Diagnosis not present

## 2018-09-12 DIAGNOSIS — I5032 Chronic diastolic (congestive) heart failure: Secondary | ICD-10-CM | POA: Diagnosis not present

## 2018-09-12 DIAGNOSIS — J9601 Acute respiratory failure with hypoxia: Secondary | ICD-10-CM | POA: Diagnosis not present

## 2018-09-12 DIAGNOSIS — I69391 Dysphagia following cerebral infarction: Secondary | ICD-10-CM | POA: Diagnosis not present

## 2018-09-12 DIAGNOSIS — R2689 Other abnormalities of gait and mobility: Secondary | ICD-10-CM | POA: Diagnosis not present

## 2018-09-12 DIAGNOSIS — F0150 Vascular dementia without behavioral disturbance: Secondary | ICD-10-CM | POA: Diagnosis not present

## 2018-09-14 DIAGNOSIS — I5032 Chronic diastolic (congestive) heart failure: Secondary | ICD-10-CM | POA: Diagnosis not present

## 2018-09-14 DIAGNOSIS — K219 Gastro-esophageal reflux disease without esophagitis: Secondary | ICD-10-CM | POA: Diagnosis not present

## 2018-09-14 DIAGNOSIS — R2689 Other abnormalities of gait and mobility: Secondary | ICD-10-CM | POA: Diagnosis not present

## 2018-09-14 DIAGNOSIS — I69391 Dysphagia following cerebral infarction: Secondary | ICD-10-CM | POA: Diagnosis not present

## 2018-09-14 DIAGNOSIS — R41841 Cognitive communication deficit: Secondary | ICD-10-CM | POA: Diagnosis not present

## 2018-09-14 DIAGNOSIS — J988 Other specified respiratory disorders: Secondary | ICD-10-CM | POA: Diagnosis not present

## 2018-09-14 DIAGNOSIS — I110 Hypertensive heart disease with heart failure: Secondary | ICD-10-CM | POA: Diagnosis not present

## 2018-09-14 DIAGNOSIS — E785 Hyperlipidemia, unspecified: Secondary | ICD-10-CM | POA: Diagnosis not present

## 2018-09-14 DIAGNOSIS — J9601 Acute respiratory failure with hypoxia: Secondary | ICD-10-CM | POA: Diagnosis not present

## 2018-09-14 DIAGNOSIS — J84112 Idiopathic pulmonary fibrosis: Secondary | ICD-10-CM | POA: Diagnosis not present

## 2018-09-14 DIAGNOSIS — F0150 Vascular dementia without behavioral disturbance: Secondary | ICD-10-CM | POA: Diagnosis not present

## 2018-09-14 DIAGNOSIS — R278 Other lack of coordination: Secondary | ICD-10-CM | POA: Diagnosis not present

## 2018-09-14 DIAGNOSIS — F329 Major depressive disorder, single episode, unspecified: Secondary | ICD-10-CM | POA: Diagnosis not present

## 2018-09-14 DIAGNOSIS — R1312 Dysphagia, oropharyngeal phase: Secondary | ICD-10-CM | POA: Diagnosis not present

## 2018-09-15 DIAGNOSIS — K219 Gastro-esophageal reflux disease without esophagitis: Secondary | ICD-10-CM | POA: Diagnosis not present

## 2018-09-15 DIAGNOSIS — J9601 Acute respiratory failure with hypoxia: Secondary | ICD-10-CM | POA: Diagnosis not present

## 2018-09-15 DIAGNOSIS — F0150 Vascular dementia without behavioral disturbance: Secondary | ICD-10-CM | POA: Diagnosis not present

## 2018-09-15 DIAGNOSIS — F329 Major depressive disorder, single episode, unspecified: Secondary | ICD-10-CM | POA: Diagnosis not present

## 2018-09-15 DIAGNOSIS — I110 Hypertensive heart disease with heart failure: Secondary | ICD-10-CM | POA: Diagnosis not present

## 2018-09-15 DIAGNOSIS — J189 Pneumonia, unspecified organism: Secondary | ICD-10-CM | POA: Diagnosis not present

## 2018-09-15 DIAGNOSIS — R2689 Other abnormalities of gait and mobility: Secondary | ICD-10-CM | POA: Diagnosis not present

## 2018-09-15 DIAGNOSIS — R634 Abnormal weight loss: Secondary | ICD-10-CM | POA: Diagnosis not present

## 2018-09-15 DIAGNOSIS — I69391 Dysphagia following cerebral infarction: Secondary | ICD-10-CM | POA: Diagnosis not present

## 2018-09-15 DIAGNOSIS — J84112 Idiopathic pulmonary fibrosis: Secondary | ICD-10-CM | POA: Diagnosis not present

## 2018-09-15 DIAGNOSIS — E785 Hyperlipidemia, unspecified: Secondary | ICD-10-CM | POA: Diagnosis not present

## 2018-09-15 DIAGNOSIS — I5032 Chronic diastolic (congestive) heart failure: Secondary | ICD-10-CM | POA: Diagnosis not present

## 2018-09-15 DIAGNOSIS — R278 Other lack of coordination: Secondary | ICD-10-CM | POA: Diagnosis not present

## 2018-09-15 DIAGNOSIS — R627 Adult failure to thrive: Secondary | ICD-10-CM | POA: Diagnosis not present

## 2018-09-15 DIAGNOSIS — J988 Other specified respiratory disorders: Secondary | ICD-10-CM | POA: Diagnosis not present

## 2018-09-15 DIAGNOSIS — R41841 Cognitive communication deficit: Secondary | ICD-10-CM | POA: Diagnosis not present

## 2018-09-15 DIAGNOSIS — R197 Diarrhea, unspecified: Secondary | ICD-10-CM | POA: Diagnosis not present

## 2018-09-15 DIAGNOSIS — R1312 Dysphagia, oropharyngeal phase: Secondary | ICD-10-CM | POA: Diagnosis not present

## 2018-09-16 DIAGNOSIS — I69391 Dysphagia following cerebral infarction: Secondary | ICD-10-CM | POA: Diagnosis not present

## 2018-09-16 DIAGNOSIS — J988 Other specified respiratory disorders: Secondary | ICD-10-CM | POA: Diagnosis not present

## 2018-09-16 DIAGNOSIS — J84112 Idiopathic pulmonary fibrosis: Secondary | ICD-10-CM | POA: Diagnosis not present

## 2018-09-16 DIAGNOSIS — R1312 Dysphagia, oropharyngeal phase: Secondary | ICD-10-CM | POA: Diagnosis not present

## 2018-09-16 DIAGNOSIS — I110 Hypertensive heart disease with heart failure: Secondary | ICD-10-CM | POA: Diagnosis not present

## 2018-09-16 DIAGNOSIS — I5032 Chronic diastolic (congestive) heart failure: Secondary | ICD-10-CM | POA: Diagnosis not present

## 2018-09-16 DIAGNOSIS — K219 Gastro-esophageal reflux disease without esophagitis: Secondary | ICD-10-CM | POA: Diagnosis not present

## 2018-09-16 DIAGNOSIS — E785 Hyperlipidemia, unspecified: Secondary | ICD-10-CM | POA: Diagnosis not present

## 2018-09-16 DIAGNOSIS — R41841 Cognitive communication deficit: Secondary | ICD-10-CM | POA: Diagnosis not present

## 2018-09-16 DIAGNOSIS — R2689 Other abnormalities of gait and mobility: Secondary | ICD-10-CM | POA: Diagnosis not present

## 2018-09-16 DIAGNOSIS — F0150 Vascular dementia without behavioral disturbance: Secondary | ICD-10-CM | POA: Diagnosis not present

## 2018-09-16 DIAGNOSIS — F329 Major depressive disorder, single episode, unspecified: Secondary | ICD-10-CM | POA: Diagnosis not present

## 2018-09-16 DIAGNOSIS — J9601 Acute respiratory failure with hypoxia: Secondary | ICD-10-CM | POA: Diagnosis not present

## 2018-09-16 DIAGNOSIS — R278 Other lack of coordination: Secondary | ICD-10-CM | POA: Diagnosis not present

## 2018-09-17 DIAGNOSIS — R41841 Cognitive communication deficit: Secondary | ICD-10-CM | POA: Diagnosis not present

## 2018-09-17 DIAGNOSIS — J9601 Acute respiratory failure with hypoxia: Secondary | ICD-10-CM | POA: Diagnosis not present

## 2018-09-17 DIAGNOSIS — F0150 Vascular dementia without behavioral disturbance: Secondary | ICD-10-CM | POA: Diagnosis not present

## 2018-09-17 DIAGNOSIS — I69391 Dysphagia following cerebral infarction: Secondary | ICD-10-CM | POA: Diagnosis not present

## 2018-09-17 DIAGNOSIS — F329 Major depressive disorder, single episode, unspecified: Secondary | ICD-10-CM | POA: Diagnosis not present

## 2018-09-17 DIAGNOSIS — R2689 Other abnormalities of gait and mobility: Secondary | ICD-10-CM | POA: Diagnosis not present

## 2018-09-17 DIAGNOSIS — R1312 Dysphagia, oropharyngeal phase: Secondary | ICD-10-CM | POA: Diagnosis not present

## 2018-09-17 DIAGNOSIS — E785 Hyperlipidemia, unspecified: Secondary | ICD-10-CM | POA: Diagnosis not present

## 2018-09-17 DIAGNOSIS — R278 Other lack of coordination: Secondary | ICD-10-CM | POA: Diagnosis not present

## 2018-09-17 DIAGNOSIS — J988 Other specified respiratory disorders: Secondary | ICD-10-CM | POA: Diagnosis not present

## 2018-09-17 DIAGNOSIS — I5032 Chronic diastolic (congestive) heart failure: Secondary | ICD-10-CM | POA: Diagnosis not present

## 2018-09-17 DIAGNOSIS — J84112 Idiopathic pulmonary fibrosis: Secondary | ICD-10-CM | POA: Diagnosis not present

## 2018-09-17 DIAGNOSIS — K219 Gastro-esophageal reflux disease without esophagitis: Secondary | ICD-10-CM | POA: Diagnosis not present

## 2018-09-17 DIAGNOSIS — I110 Hypertensive heart disease with heart failure: Secondary | ICD-10-CM | POA: Diagnosis not present

## 2018-09-18 DIAGNOSIS — I69391 Dysphagia following cerebral infarction: Secondary | ICD-10-CM | POA: Diagnosis not present

## 2018-09-18 DIAGNOSIS — F0150 Vascular dementia without behavioral disturbance: Secondary | ICD-10-CM | POA: Diagnosis not present

## 2018-09-18 DIAGNOSIS — K219 Gastro-esophageal reflux disease without esophagitis: Secondary | ICD-10-CM | POA: Diagnosis not present

## 2018-09-18 DIAGNOSIS — I5032 Chronic diastolic (congestive) heart failure: Secondary | ICD-10-CM | POA: Diagnosis not present

## 2018-09-18 DIAGNOSIS — F329 Major depressive disorder, single episode, unspecified: Secondary | ICD-10-CM | POA: Diagnosis not present

## 2018-09-18 DIAGNOSIS — R278 Other lack of coordination: Secondary | ICD-10-CM | POA: Diagnosis not present

## 2018-09-18 DIAGNOSIS — I110 Hypertensive heart disease with heart failure: Secondary | ICD-10-CM | POA: Diagnosis not present

## 2018-09-18 DIAGNOSIS — J988 Other specified respiratory disorders: Secondary | ICD-10-CM | POA: Diagnosis not present

## 2018-09-18 DIAGNOSIS — R41841 Cognitive communication deficit: Secondary | ICD-10-CM | POA: Diagnosis not present

## 2018-09-18 DIAGNOSIS — J84112 Idiopathic pulmonary fibrosis: Secondary | ICD-10-CM | POA: Diagnosis not present

## 2018-09-18 DIAGNOSIS — E785 Hyperlipidemia, unspecified: Secondary | ICD-10-CM | POA: Diagnosis not present

## 2018-09-18 DIAGNOSIS — R1312 Dysphagia, oropharyngeal phase: Secondary | ICD-10-CM | POA: Diagnosis not present

## 2018-09-18 DIAGNOSIS — R2689 Other abnormalities of gait and mobility: Secondary | ICD-10-CM | POA: Diagnosis not present

## 2018-09-18 DIAGNOSIS — J9601 Acute respiratory failure with hypoxia: Secondary | ICD-10-CM | POA: Diagnosis not present

## 2018-09-21 DIAGNOSIS — R1312 Dysphagia, oropharyngeal phase: Secondary | ICD-10-CM | POA: Diagnosis not present

## 2018-09-21 DIAGNOSIS — F0150 Vascular dementia without behavioral disturbance: Secondary | ICD-10-CM | POA: Diagnosis not present

## 2018-09-21 DIAGNOSIS — J9601 Acute respiratory failure with hypoxia: Secondary | ICD-10-CM | POA: Diagnosis not present

## 2018-09-21 DIAGNOSIS — R278 Other lack of coordination: Secondary | ICD-10-CM | POA: Diagnosis not present

## 2018-09-21 DIAGNOSIS — E785 Hyperlipidemia, unspecified: Secondary | ICD-10-CM | POA: Diagnosis not present

## 2018-09-21 DIAGNOSIS — I110 Hypertensive heart disease with heart failure: Secondary | ICD-10-CM | POA: Diagnosis not present

## 2018-09-21 DIAGNOSIS — F329 Major depressive disorder, single episode, unspecified: Secondary | ICD-10-CM | POA: Diagnosis not present

## 2018-09-21 DIAGNOSIS — I69391 Dysphagia following cerebral infarction: Secondary | ICD-10-CM | POA: Diagnosis not present

## 2018-09-21 DIAGNOSIS — I5032 Chronic diastolic (congestive) heart failure: Secondary | ICD-10-CM | POA: Diagnosis not present

## 2018-09-21 DIAGNOSIS — R2689 Other abnormalities of gait and mobility: Secondary | ICD-10-CM | POA: Diagnosis not present

## 2018-09-21 DIAGNOSIS — J988 Other specified respiratory disorders: Secondary | ICD-10-CM | POA: Diagnosis not present

## 2018-09-21 DIAGNOSIS — J84112 Idiopathic pulmonary fibrosis: Secondary | ICD-10-CM | POA: Diagnosis not present

## 2018-09-21 DIAGNOSIS — R41841 Cognitive communication deficit: Secondary | ICD-10-CM | POA: Diagnosis not present

## 2018-09-21 DIAGNOSIS — K219 Gastro-esophageal reflux disease without esophagitis: Secondary | ICD-10-CM | POA: Diagnosis not present

## 2018-09-22 DIAGNOSIS — J988 Other specified respiratory disorders: Secondary | ICD-10-CM | POA: Diagnosis not present

## 2018-09-22 DIAGNOSIS — R41841 Cognitive communication deficit: Secondary | ICD-10-CM | POA: Diagnosis not present

## 2018-09-22 DIAGNOSIS — F329 Major depressive disorder, single episode, unspecified: Secondary | ICD-10-CM | POA: Diagnosis not present

## 2018-09-22 DIAGNOSIS — I69391 Dysphagia following cerebral infarction: Secondary | ICD-10-CM | POA: Diagnosis not present

## 2018-09-22 DIAGNOSIS — J84112 Idiopathic pulmonary fibrosis: Secondary | ICD-10-CM | POA: Diagnosis not present

## 2018-09-22 DIAGNOSIS — I5032 Chronic diastolic (congestive) heart failure: Secondary | ICD-10-CM | POA: Diagnosis not present

## 2018-09-22 DIAGNOSIS — E785 Hyperlipidemia, unspecified: Secondary | ICD-10-CM | POA: Diagnosis not present

## 2018-09-22 DIAGNOSIS — J9601 Acute respiratory failure with hypoxia: Secondary | ICD-10-CM | POA: Diagnosis not present

## 2018-09-22 DIAGNOSIS — F0150 Vascular dementia without behavioral disturbance: Secondary | ICD-10-CM | POA: Diagnosis not present

## 2018-09-22 DIAGNOSIS — R1312 Dysphagia, oropharyngeal phase: Secondary | ICD-10-CM | POA: Diagnosis not present

## 2018-09-22 DIAGNOSIS — R278 Other lack of coordination: Secondary | ICD-10-CM | POA: Diagnosis not present

## 2018-09-22 DIAGNOSIS — R2689 Other abnormalities of gait and mobility: Secondary | ICD-10-CM | POA: Diagnosis not present

## 2018-09-22 DIAGNOSIS — I110 Hypertensive heart disease with heart failure: Secondary | ICD-10-CM | POA: Diagnosis not present

## 2018-09-22 DIAGNOSIS — K219 Gastro-esophageal reflux disease without esophagitis: Secondary | ICD-10-CM | POA: Diagnosis not present

## 2018-09-23 DIAGNOSIS — I5032 Chronic diastolic (congestive) heart failure: Secondary | ICD-10-CM | POA: Diagnosis not present

## 2018-09-23 DIAGNOSIS — J9601 Acute respiratory failure with hypoxia: Secondary | ICD-10-CM | POA: Diagnosis not present

## 2018-09-23 DIAGNOSIS — E785 Hyperlipidemia, unspecified: Secondary | ICD-10-CM | POA: Diagnosis not present

## 2018-09-23 DIAGNOSIS — F329 Major depressive disorder, single episode, unspecified: Secondary | ICD-10-CM | POA: Diagnosis not present

## 2018-09-23 DIAGNOSIS — R278 Other lack of coordination: Secondary | ICD-10-CM | POA: Diagnosis not present

## 2018-09-23 DIAGNOSIS — R41841 Cognitive communication deficit: Secondary | ICD-10-CM | POA: Diagnosis not present

## 2018-09-23 DIAGNOSIS — I69391 Dysphagia following cerebral infarction: Secondary | ICD-10-CM | POA: Diagnosis not present

## 2018-09-23 DIAGNOSIS — F0150 Vascular dementia without behavioral disturbance: Secondary | ICD-10-CM | POA: Diagnosis not present

## 2018-09-23 DIAGNOSIS — R1312 Dysphagia, oropharyngeal phase: Secondary | ICD-10-CM | POA: Diagnosis not present

## 2018-09-23 DIAGNOSIS — J84112 Idiopathic pulmonary fibrosis: Secondary | ICD-10-CM | POA: Diagnosis not present

## 2018-09-23 DIAGNOSIS — R2689 Other abnormalities of gait and mobility: Secondary | ICD-10-CM | POA: Diagnosis not present

## 2018-09-23 DIAGNOSIS — I110 Hypertensive heart disease with heart failure: Secondary | ICD-10-CM | POA: Diagnosis not present

## 2018-09-23 DIAGNOSIS — J988 Other specified respiratory disorders: Secondary | ICD-10-CM | POA: Diagnosis not present

## 2018-09-23 DIAGNOSIS — K219 Gastro-esophageal reflux disease without esophagitis: Secondary | ICD-10-CM | POA: Diagnosis not present

## 2018-09-24 DIAGNOSIS — J988 Other specified respiratory disorders: Secondary | ICD-10-CM | POA: Diagnosis not present

## 2018-09-24 DIAGNOSIS — R278 Other lack of coordination: Secondary | ICD-10-CM | POA: Diagnosis not present

## 2018-09-24 DIAGNOSIS — J84112 Idiopathic pulmonary fibrosis: Secondary | ICD-10-CM | POA: Diagnosis not present

## 2018-09-24 DIAGNOSIS — I110 Hypertensive heart disease with heart failure: Secondary | ICD-10-CM | POA: Diagnosis not present

## 2018-09-24 DIAGNOSIS — R1312 Dysphagia, oropharyngeal phase: Secondary | ICD-10-CM | POA: Diagnosis not present

## 2018-09-24 DIAGNOSIS — E785 Hyperlipidemia, unspecified: Secondary | ICD-10-CM | POA: Diagnosis not present

## 2018-09-24 DIAGNOSIS — K219 Gastro-esophageal reflux disease without esophagitis: Secondary | ICD-10-CM | POA: Diagnosis not present

## 2018-09-24 DIAGNOSIS — R41841 Cognitive communication deficit: Secondary | ICD-10-CM | POA: Diagnosis not present

## 2018-09-24 DIAGNOSIS — F0150 Vascular dementia without behavioral disturbance: Secondary | ICD-10-CM | POA: Diagnosis not present

## 2018-09-24 DIAGNOSIS — I69391 Dysphagia following cerebral infarction: Secondary | ICD-10-CM | POA: Diagnosis not present

## 2018-09-24 DIAGNOSIS — J9601 Acute respiratory failure with hypoxia: Secondary | ICD-10-CM | POA: Diagnosis not present

## 2018-09-24 DIAGNOSIS — R2689 Other abnormalities of gait and mobility: Secondary | ICD-10-CM | POA: Diagnosis not present

## 2018-09-24 DIAGNOSIS — I5032 Chronic diastolic (congestive) heart failure: Secondary | ICD-10-CM | POA: Diagnosis not present

## 2018-09-24 DIAGNOSIS — F329 Major depressive disorder, single episode, unspecified: Secondary | ICD-10-CM | POA: Diagnosis not present

## 2018-09-27 DIAGNOSIS — J988 Other specified respiratory disorders: Secondary | ICD-10-CM | POA: Diagnosis not present

## 2018-09-27 DIAGNOSIS — E785 Hyperlipidemia, unspecified: Secondary | ICD-10-CM | POA: Diagnosis not present

## 2018-09-27 DIAGNOSIS — J84112 Idiopathic pulmonary fibrosis: Secondary | ICD-10-CM | POA: Diagnosis not present

## 2018-09-27 DIAGNOSIS — K219 Gastro-esophageal reflux disease without esophagitis: Secondary | ICD-10-CM | POA: Diagnosis not present

## 2018-09-27 DIAGNOSIS — I69391 Dysphagia following cerebral infarction: Secondary | ICD-10-CM | POA: Diagnosis not present

## 2018-09-27 DIAGNOSIS — R278 Other lack of coordination: Secondary | ICD-10-CM | POA: Diagnosis not present

## 2018-09-27 DIAGNOSIS — I110 Hypertensive heart disease with heart failure: Secondary | ICD-10-CM | POA: Diagnosis not present

## 2018-09-27 DIAGNOSIS — R41841 Cognitive communication deficit: Secondary | ICD-10-CM | POA: Diagnosis not present

## 2018-09-27 DIAGNOSIS — J9601 Acute respiratory failure with hypoxia: Secondary | ICD-10-CM | POA: Diagnosis not present

## 2018-09-27 DIAGNOSIS — F0150 Vascular dementia without behavioral disturbance: Secondary | ICD-10-CM | POA: Diagnosis not present

## 2018-09-27 DIAGNOSIS — R2689 Other abnormalities of gait and mobility: Secondary | ICD-10-CM | POA: Diagnosis not present

## 2018-09-27 DIAGNOSIS — I5032 Chronic diastolic (congestive) heart failure: Secondary | ICD-10-CM | POA: Diagnosis not present

## 2018-09-27 DIAGNOSIS — F329 Major depressive disorder, single episode, unspecified: Secondary | ICD-10-CM | POA: Diagnosis not present

## 2018-09-27 DIAGNOSIS — R1312 Dysphagia, oropharyngeal phase: Secondary | ICD-10-CM | POA: Diagnosis not present

## 2018-09-28 DIAGNOSIS — R1312 Dysphagia, oropharyngeal phase: Secondary | ICD-10-CM | POA: Diagnosis not present

## 2018-09-28 DIAGNOSIS — R2689 Other abnormalities of gait and mobility: Secondary | ICD-10-CM | POA: Diagnosis not present

## 2018-09-28 DIAGNOSIS — I110 Hypertensive heart disease with heart failure: Secondary | ICD-10-CM | POA: Diagnosis not present

## 2018-09-28 DIAGNOSIS — J988 Other specified respiratory disorders: Secondary | ICD-10-CM | POA: Diagnosis not present

## 2018-09-28 DIAGNOSIS — J84112 Idiopathic pulmonary fibrosis: Secondary | ICD-10-CM | POA: Diagnosis not present

## 2018-09-28 DIAGNOSIS — R278 Other lack of coordination: Secondary | ICD-10-CM | POA: Diagnosis not present

## 2018-09-28 DIAGNOSIS — K219 Gastro-esophageal reflux disease without esophagitis: Secondary | ICD-10-CM | POA: Diagnosis not present

## 2018-09-28 DIAGNOSIS — J9601 Acute respiratory failure with hypoxia: Secondary | ICD-10-CM | POA: Diagnosis not present

## 2018-09-28 DIAGNOSIS — E785 Hyperlipidemia, unspecified: Secondary | ICD-10-CM | POA: Diagnosis not present

## 2018-09-28 DIAGNOSIS — I69391 Dysphagia following cerebral infarction: Secondary | ICD-10-CM | POA: Diagnosis not present

## 2018-09-28 DIAGNOSIS — F0150 Vascular dementia without behavioral disturbance: Secondary | ICD-10-CM | POA: Diagnosis not present

## 2018-09-28 DIAGNOSIS — F329 Major depressive disorder, single episode, unspecified: Secondary | ICD-10-CM | POA: Diagnosis not present

## 2018-09-28 DIAGNOSIS — I5032 Chronic diastolic (congestive) heart failure: Secondary | ICD-10-CM | POA: Diagnosis not present

## 2018-09-28 DIAGNOSIS — R41841 Cognitive communication deficit: Secondary | ICD-10-CM | POA: Diagnosis not present

## 2018-09-29 DIAGNOSIS — J988 Other specified respiratory disorders: Secondary | ICD-10-CM | POA: Diagnosis not present

## 2018-09-29 DIAGNOSIS — I110 Hypertensive heart disease with heart failure: Secondary | ICD-10-CM | POA: Diagnosis not present

## 2018-09-29 DIAGNOSIS — R278 Other lack of coordination: Secondary | ICD-10-CM | POA: Diagnosis not present

## 2018-09-29 DIAGNOSIS — R1312 Dysphagia, oropharyngeal phase: Secondary | ICD-10-CM | POA: Diagnosis not present

## 2018-09-29 DIAGNOSIS — R41841 Cognitive communication deficit: Secondary | ICD-10-CM | POA: Diagnosis not present

## 2018-09-29 DIAGNOSIS — I5032 Chronic diastolic (congestive) heart failure: Secondary | ICD-10-CM | POA: Diagnosis not present

## 2018-09-29 DIAGNOSIS — J9601 Acute respiratory failure with hypoxia: Secondary | ICD-10-CM | POA: Diagnosis not present

## 2018-09-29 DIAGNOSIS — E785 Hyperlipidemia, unspecified: Secondary | ICD-10-CM | POA: Diagnosis not present

## 2018-09-29 DIAGNOSIS — I69391 Dysphagia following cerebral infarction: Secondary | ICD-10-CM | POA: Diagnosis not present

## 2018-09-29 DIAGNOSIS — K219 Gastro-esophageal reflux disease without esophagitis: Secondary | ICD-10-CM | POA: Diagnosis not present

## 2018-09-29 DIAGNOSIS — R2689 Other abnormalities of gait and mobility: Secondary | ICD-10-CM | POA: Diagnosis not present

## 2018-09-29 DIAGNOSIS — F0150 Vascular dementia without behavioral disturbance: Secondary | ICD-10-CM | POA: Diagnosis not present

## 2018-09-29 DIAGNOSIS — F329 Major depressive disorder, single episode, unspecified: Secondary | ICD-10-CM | POA: Diagnosis not present

## 2018-09-29 DIAGNOSIS — J84112 Idiopathic pulmonary fibrosis: Secondary | ICD-10-CM | POA: Diagnosis not present

## 2018-09-30 DIAGNOSIS — J988 Other specified respiratory disorders: Secondary | ICD-10-CM | POA: Diagnosis not present

## 2018-09-30 DIAGNOSIS — E785 Hyperlipidemia, unspecified: Secondary | ICD-10-CM | POA: Diagnosis not present

## 2018-09-30 DIAGNOSIS — R2689 Other abnormalities of gait and mobility: Secondary | ICD-10-CM | POA: Diagnosis not present

## 2018-09-30 DIAGNOSIS — F0150 Vascular dementia without behavioral disturbance: Secondary | ICD-10-CM | POA: Diagnosis not present

## 2018-09-30 DIAGNOSIS — R41841 Cognitive communication deficit: Secondary | ICD-10-CM | POA: Diagnosis not present

## 2018-09-30 DIAGNOSIS — I69391 Dysphagia following cerebral infarction: Secondary | ICD-10-CM | POA: Diagnosis not present

## 2018-09-30 DIAGNOSIS — I110 Hypertensive heart disease with heart failure: Secondary | ICD-10-CM | POA: Diagnosis not present

## 2018-09-30 DIAGNOSIS — R278 Other lack of coordination: Secondary | ICD-10-CM | POA: Diagnosis not present

## 2018-09-30 DIAGNOSIS — F329 Major depressive disorder, single episode, unspecified: Secondary | ICD-10-CM | POA: Diagnosis not present

## 2018-09-30 DIAGNOSIS — I5032 Chronic diastolic (congestive) heart failure: Secondary | ICD-10-CM | POA: Diagnosis not present

## 2018-09-30 DIAGNOSIS — K219 Gastro-esophageal reflux disease without esophagitis: Secondary | ICD-10-CM | POA: Diagnosis not present

## 2018-09-30 DIAGNOSIS — J84112 Idiopathic pulmonary fibrosis: Secondary | ICD-10-CM | POA: Diagnosis not present

## 2018-09-30 DIAGNOSIS — R1312 Dysphagia, oropharyngeal phase: Secondary | ICD-10-CM | POA: Diagnosis not present

## 2018-09-30 DIAGNOSIS — J9601 Acute respiratory failure with hypoxia: Secondary | ICD-10-CM | POA: Diagnosis not present

## 2018-10-01 DIAGNOSIS — I110 Hypertensive heart disease with heart failure: Secondary | ICD-10-CM | POA: Diagnosis not present

## 2018-10-01 DIAGNOSIS — R41841 Cognitive communication deficit: Secondary | ICD-10-CM | POA: Diagnosis not present

## 2018-10-01 DIAGNOSIS — I5032 Chronic diastolic (congestive) heart failure: Secondary | ICD-10-CM | POA: Diagnosis not present

## 2018-10-01 DIAGNOSIS — J9601 Acute respiratory failure with hypoxia: Secondary | ICD-10-CM | POA: Diagnosis not present

## 2018-10-01 DIAGNOSIS — F0150 Vascular dementia without behavioral disturbance: Secondary | ICD-10-CM | POA: Diagnosis not present

## 2018-10-01 DIAGNOSIS — K219 Gastro-esophageal reflux disease without esophagitis: Secondary | ICD-10-CM | POA: Diagnosis not present

## 2018-10-01 DIAGNOSIS — R1312 Dysphagia, oropharyngeal phase: Secondary | ICD-10-CM | POA: Diagnosis not present

## 2018-10-01 DIAGNOSIS — R278 Other lack of coordination: Secondary | ICD-10-CM | POA: Diagnosis not present

## 2018-10-01 DIAGNOSIS — F329 Major depressive disorder, single episode, unspecified: Secondary | ICD-10-CM | POA: Diagnosis not present

## 2018-10-01 DIAGNOSIS — I69391 Dysphagia following cerebral infarction: Secondary | ICD-10-CM | POA: Diagnosis not present

## 2018-10-01 DIAGNOSIS — J988 Other specified respiratory disorders: Secondary | ICD-10-CM | POA: Diagnosis not present

## 2018-10-01 DIAGNOSIS — E785 Hyperlipidemia, unspecified: Secondary | ICD-10-CM | POA: Diagnosis not present

## 2018-10-01 DIAGNOSIS — R2689 Other abnormalities of gait and mobility: Secondary | ICD-10-CM | POA: Diagnosis not present

## 2018-10-01 DIAGNOSIS — J84112 Idiopathic pulmonary fibrosis: Secondary | ICD-10-CM | POA: Diagnosis not present

## 2018-10-02 DIAGNOSIS — J84112 Idiopathic pulmonary fibrosis: Secondary | ICD-10-CM | POA: Diagnosis not present

## 2018-10-02 DIAGNOSIS — R41841 Cognitive communication deficit: Secondary | ICD-10-CM | POA: Diagnosis not present

## 2018-10-02 DIAGNOSIS — K219 Gastro-esophageal reflux disease without esophagitis: Secondary | ICD-10-CM | POA: Diagnosis not present

## 2018-10-02 DIAGNOSIS — F329 Major depressive disorder, single episode, unspecified: Secondary | ICD-10-CM | POA: Diagnosis not present

## 2018-10-02 DIAGNOSIS — J9601 Acute respiratory failure with hypoxia: Secondary | ICD-10-CM | POA: Diagnosis not present

## 2018-10-02 DIAGNOSIS — I5032 Chronic diastolic (congestive) heart failure: Secondary | ICD-10-CM | POA: Diagnosis not present

## 2018-10-02 DIAGNOSIS — R2689 Other abnormalities of gait and mobility: Secondary | ICD-10-CM | POA: Diagnosis not present

## 2018-10-02 DIAGNOSIS — R1312 Dysphagia, oropharyngeal phase: Secondary | ICD-10-CM | POA: Diagnosis not present

## 2018-10-02 DIAGNOSIS — I69391 Dysphagia following cerebral infarction: Secondary | ICD-10-CM | POA: Diagnosis not present

## 2018-10-02 DIAGNOSIS — E785 Hyperlipidemia, unspecified: Secondary | ICD-10-CM | POA: Diagnosis not present

## 2018-10-02 DIAGNOSIS — F0150 Vascular dementia without behavioral disturbance: Secondary | ICD-10-CM | POA: Diagnosis not present

## 2018-10-02 DIAGNOSIS — I110 Hypertensive heart disease with heart failure: Secondary | ICD-10-CM | POA: Diagnosis not present

## 2018-10-02 DIAGNOSIS — R278 Other lack of coordination: Secondary | ICD-10-CM | POA: Diagnosis not present

## 2018-10-02 DIAGNOSIS — J988 Other specified respiratory disorders: Secondary | ICD-10-CM | POA: Diagnosis not present

## 2018-10-05 DIAGNOSIS — R1312 Dysphagia, oropharyngeal phase: Secondary | ICD-10-CM | POA: Diagnosis not present

## 2018-10-05 DIAGNOSIS — J988 Other specified respiratory disorders: Secondary | ICD-10-CM | POA: Diagnosis not present

## 2018-10-05 DIAGNOSIS — J9601 Acute respiratory failure with hypoxia: Secondary | ICD-10-CM | POA: Diagnosis not present

## 2018-10-05 DIAGNOSIS — R41841 Cognitive communication deficit: Secondary | ICD-10-CM | POA: Diagnosis not present

## 2018-10-05 DIAGNOSIS — F329 Major depressive disorder, single episode, unspecified: Secondary | ICD-10-CM | POA: Diagnosis not present

## 2018-10-05 DIAGNOSIS — K219 Gastro-esophageal reflux disease without esophagitis: Secondary | ICD-10-CM | POA: Diagnosis not present

## 2018-10-05 DIAGNOSIS — F0150 Vascular dementia without behavioral disturbance: Secondary | ICD-10-CM | POA: Diagnosis not present

## 2018-10-05 DIAGNOSIS — J84112 Idiopathic pulmonary fibrosis: Secondary | ICD-10-CM | POA: Diagnosis not present

## 2018-10-05 DIAGNOSIS — E785 Hyperlipidemia, unspecified: Secondary | ICD-10-CM | POA: Diagnosis not present

## 2018-10-05 DIAGNOSIS — I110 Hypertensive heart disease with heart failure: Secondary | ICD-10-CM | POA: Diagnosis not present

## 2018-10-05 DIAGNOSIS — R278 Other lack of coordination: Secondary | ICD-10-CM | POA: Diagnosis not present

## 2018-10-05 DIAGNOSIS — R2689 Other abnormalities of gait and mobility: Secondary | ICD-10-CM | POA: Diagnosis not present

## 2018-10-05 DIAGNOSIS — I69391 Dysphagia following cerebral infarction: Secondary | ICD-10-CM | POA: Diagnosis not present

## 2018-10-05 DIAGNOSIS — I5032 Chronic diastolic (congestive) heart failure: Secondary | ICD-10-CM | POA: Diagnosis not present

## 2018-10-07 DIAGNOSIS — I10 Essential (primary) hypertension: Secondary | ICD-10-CM | POA: Diagnosis not present

## 2018-10-07 DIAGNOSIS — R627 Adult failure to thrive: Secondary | ICD-10-CM | POA: Diagnosis not present

## 2018-10-07 DIAGNOSIS — I6389 Other cerebral infarction: Secondary | ICD-10-CM | POA: Diagnosis not present

## 2018-10-07 DIAGNOSIS — J84112 Idiopathic pulmonary fibrosis: Secondary | ICD-10-CM | POA: Diagnosis not present

## 2018-10-12 ENCOUNTER — Telehealth: Payer: Self-pay

## 2018-10-12 NOTE — Telephone Encounter (Signed)
Called son [name] and let him know that I received a paper from [facility] from [facility] at [phone] and let him know that they are requesting a follow up visit with Dr. Elease Hashimoto. Mitch let me know that she is in [facility] in [place] and under the care of a different provider.

## 2018-10-16 DIAGNOSIS — F322 Major depressive disorder, single episode, severe without psychotic features: Secondary | ICD-10-CM | POA: Diagnosis not present

## 2018-10-16 DIAGNOSIS — I10 Essential (primary) hypertension: Secondary | ICD-10-CM | POA: Diagnosis not present

## 2018-10-16 DIAGNOSIS — I5030 Unspecified diastolic (congestive) heart failure: Secondary | ICD-10-CM | POA: Diagnosis not present

## 2018-10-16 DIAGNOSIS — F0151 Vascular dementia with behavioral disturbance: Secondary | ICD-10-CM | POA: Diagnosis not present

## 2018-11-04 DIAGNOSIS — I10 Essential (primary) hypertension: Secondary | ICD-10-CM | POA: Diagnosis not present

## 2018-11-04 DIAGNOSIS — J84112 Idiopathic pulmonary fibrosis: Secondary | ICD-10-CM | POA: Diagnosis not present

## 2018-11-04 DIAGNOSIS — I6389 Other cerebral infarction: Secondary | ICD-10-CM | POA: Diagnosis not present

## 2018-11-04 DIAGNOSIS — R634 Abnormal weight loss: Secondary | ICD-10-CM | POA: Diagnosis not present

## 2018-11-11 DIAGNOSIS — E785 Hyperlipidemia, unspecified: Secondary | ICD-10-CM | POA: Diagnosis not present

## 2018-11-11 DIAGNOSIS — R278 Other lack of coordination: Secondary | ICD-10-CM | POA: Diagnosis not present

## 2018-11-11 DIAGNOSIS — F329 Major depressive disorder, single episode, unspecified: Secondary | ICD-10-CM | POA: Diagnosis not present

## 2018-11-11 DIAGNOSIS — F0150 Vascular dementia without behavioral disturbance: Secondary | ICD-10-CM | POA: Diagnosis not present

## 2018-11-11 DIAGNOSIS — K219 Gastro-esophageal reflux disease without esophagitis: Secondary | ICD-10-CM | POA: Diagnosis not present

## 2018-11-11 DIAGNOSIS — I5032 Chronic diastolic (congestive) heart failure: Secondary | ICD-10-CM | POA: Diagnosis not present

## 2018-11-11 DIAGNOSIS — J988 Other specified respiratory disorders: Secondary | ICD-10-CM | POA: Diagnosis not present

## 2018-11-11 DIAGNOSIS — I69391 Dysphagia following cerebral infarction: Secondary | ICD-10-CM | POA: Diagnosis not present

## 2018-11-11 DIAGNOSIS — R2689 Other abnormalities of gait and mobility: Secondary | ICD-10-CM | POA: Diagnosis not present

## 2018-11-11 DIAGNOSIS — R1312 Dysphagia, oropharyngeal phase: Secondary | ICD-10-CM | POA: Diagnosis not present

## 2018-11-11 DIAGNOSIS — I110 Hypertensive heart disease with heart failure: Secondary | ICD-10-CM | POA: Diagnosis not present

## 2018-11-11 DIAGNOSIS — J84112 Idiopathic pulmonary fibrosis: Secondary | ICD-10-CM | POA: Diagnosis not present

## 2018-11-11 DIAGNOSIS — J9601 Acute respiratory failure with hypoxia: Secondary | ICD-10-CM | POA: Diagnosis not present

## 2018-11-11 DIAGNOSIS — R41841 Cognitive communication deficit: Secondary | ICD-10-CM | POA: Diagnosis not present

## 2018-11-12 DIAGNOSIS — R41841 Cognitive communication deficit: Secondary | ICD-10-CM | POA: Diagnosis not present

## 2018-11-12 DIAGNOSIS — F0150 Vascular dementia without behavioral disturbance: Secondary | ICD-10-CM | POA: Diagnosis not present

## 2018-11-12 DIAGNOSIS — R278 Other lack of coordination: Secondary | ICD-10-CM | POA: Diagnosis not present

## 2018-11-12 DIAGNOSIS — K219 Gastro-esophageal reflux disease without esophagitis: Secondary | ICD-10-CM | POA: Diagnosis not present

## 2018-11-12 DIAGNOSIS — J988 Other specified respiratory disorders: Secondary | ICD-10-CM | POA: Diagnosis not present

## 2018-11-12 DIAGNOSIS — J9601 Acute respiratory failure with hypoxia: Secondary | ICD-10-CM | POA: Diagnosis not present

## 2018-11-12 DIAGNOSIS — R2689 Other abnormalities of gait and mobility: Secondary | ICD-10-CM | POA: Diagnosis not present

## 2018-11-12 DIAGNOSIS — J84112 Idiopathic pulmonary fibrosis: Secondary | ICD-10-CM | POA: Diagnosis not present

## 2018-11-12 DIAGNOSIS — F329 Major depressive disorder, single episode, unspecified: Secondary | ICD-10-CM | POA: Diagnosis not present

## 2018-11-12 DIAGNOSIS — I110 Hypertensive heart disease with heart failure: Secondary | ICD-10-CM | POA: Diagnosis not present

## 2018-11-12 DIAGNOSIS — I5032 Chronic diastolic (congestive) heart failure: Secondary | ICD-10-CM | POA: Diagnosis not present

## 2018-11-12 DIAGNOSIS — R1312 Dysphagia, oropharyngeal phase: Secondary | ICD-10-CM | POA: Diagnosis not present

## 2018-11-12 DIAGNOSIS — I69391 Dysphagia following cerebral infarction: Secondary | ICD-10-CM | POA: Diagnosis not present

## 2018-11-12 DIAGNOSIS — E785 Hyperlipidemia, unspecified: Secondary | ICD-10-CM | POA: Diagnosis not present

## 2018-11-13 DIAGNOSIS — E785 Hyperlipidemia, unspecified: Secondary | ICD-10-CM | POA: Diagnosis not present

## 2018-11-13 DIAGNOSIS — I110 Hypertensive heart disease with heart failure: Secondary | ICD-10-CM | POA: Diagnosis not present

## 2018-11-13 DIAGNOSIS — R2689 Other abnormalities of gait and mobility: Secondary | ICD-10-CM | POA: Diagnosis not present

## 2018-11-13 DIAGNOSIS — K219 Gastro-esophageal reflux disease without esophagitis: Secondary | ICD-10-CM | POA: Diagnosis not present

## 2018-11-13 DIAGNOSIS — J84112 Idiopathic pulmonary fibrosis: Secondary | ICD-10-CM | POA: Diagnosis not present

## 2018-11-13 DIAGNOSIS — F0150 Vascular dementia without behavioral disturbance: Secondary | ICD-10-CM | POA: Diagnosis not present

## 2018-11-13 DIAGNOSIS — R278 Other lack of coordination: Secondary | ICD-10-CM | POA: Diagnosis not present

## 2018-11-13 DIAGNOSIS — R1312 Dysphagia, oropharyngeal phase: Secondary | ICD-10-CM | POA: Diagnosis not present

## 2018-11-13 DIAGNOSIS — F329 Major depressive disorder, single episode, unspecified: Secondary | ICD-10-CM | POA: Diagnosis not present

## 2018-11-13 DIAGNOSIS — F322 Major depressive disorder, single episode, severe without psychotic features: Secondary | ICD-10-CM | POA: Diagnosis not present

## 2018-11-13 DIAGNOSIS — I5032 Chronic diastolic (congestive) heart failure: Secondary | ICD-10-CM | POA: Diagnosis not present

## 2018-11-13 DIAGNOSIS — J988 Other specified respiratory disorders: Secondary | ICD-10-CM | POA: Diagnosis not present

## 2018-11-13 DIAGNOSIS — I5030 Unspecified diastolic (congestive) heart failure: Secondary | ICD-10-CM | POA: Diagnosis not present

## 2018-11-13 DIAGNOSIS — F0151 Vascular dementia with behavioral disturbance: Secondary | ICD-10-CM | POA: Diagnosis not present

## 2018-11-13 DIAGNOSIS — J9601 Acute respiratory failure with hypoxia: Secondary | ICD-10-CM | POA: Diagnosis not present

## 2018-11-13 DIAGNOSIS — I10 Essential (primary) hypertension: Secondary | ICD-10-CM | POA: Diagnosis not present

## 2018-11-13 DIAGNOSIS — R41841 Cognitive communication deficit: Secondary | ICD-10-CM | POA: Diagnosis not present

## 2018-11-13 DIAGNOSIS — I69391 Dysphagia following cerebral infarction: Secondary | ICD-10-CM | POA: Diagnosis not present

## 2018-11-16 DIAGNOSIS — J988 Other specified respiratory disorders: Secondary | ICD-10-CM | POA: Diagnosis not present

## 2018-11-16 DIAGNOSIS — J84112 Idiopathic pulmonary fibrosis: Secondary | ICD-10-CM | POA: Diagnosis not present

## 2018-11-16 DIAGNOSIS — F329 Major depressive disorder, single episode, unspecified: Secondary | ICD-10-CM | POA: Diagnosis not present

## 2018-11-16 DIAGNOSIS — F0150 Vascular dementia without behavioral disturbance: Secondary | ICD-10-CM | POA: Diagnosis not present

## 2018-11-16 DIAGNOSIS — R41841 Cognitive communication deficit: Secondary | ICD-10-CM | POA: Diagnosis not present

## 2018-11-16 DIAGNOSIS — R278 Other lack of coordination: Secondary | ICD-10-CM | POA: Diagnosis not present

## 2018-11-16 DIAGNOSIS — I110 Hypertensive heart disease with heart failure: Secondary | ICD-10-CM | POA: Diagnosis not present

## 2018-11-16 DIAGNOSIS — R2689 Other abnormalities of gait and mobility: Secondary | ICD-10-CM | POA: Diagnosis not present

## 2018-11-16 DIAGNOSIS — K219 Gastro-esophageal reflux disease without esophagitis: Secondary | ICD-10-CM | POA: Diagnosis not present

## 2018-11-16 DIAGNOSIS — J9601 Acute respiratory failure with hypoxia: Secondary | ICD-10-CM | POA: Diagnosis not present

## 2018-11-16 DIAGNOSIS — R1312 Dysphagia, oropharyngeal phase: Secondary | ICD-10-CM | POA: Diagnosis not present

## 2018-11-16 DIAGNOSIS — I5032 Chronic diastolic (congestive) heart failure: Secondary | ICD-10-CM | POA: Diagnosis not present

## 2018-11-16 DIAGNOSIS — I69391 Dysphagia following cerebral infarction: Secondary | ICD-10-CM | POA: Diagnosis not present

## 2018-11-16 DIAGNOSIS — E785 Hyperlipidemia, unspecified: Secondary | ICD-10-CM | POA: Diagnosis not present

## 2018-11-17 DIAGNOSIS — J9601 Acute respiratory failure with hypoxia: Secondary | ICD-10-CM | POA: Diagnosis not present

## 2018-11-17 DIAGNOSIS — F329 Major depressive disorder, single episode, unspecified: Secondary | ICD-10-CM | POA: Diagnosis not present

## 2018-11-17 DIAGNOSIS — J84112 Idiopathic pulmonary fibrosis: Secondary | ICD-10-CM | POA: Diagnosis not present

## 2018-11-17 DIAGNOSIS — R1312 Dysphagia, oropharyngeal phase: Secondary | ICD-10-CM | POA: Diagnosis not present

## 2018-11-17 DIAGNOSIS — I110 Hypertensive heart disease with heart failure: Secondary | ICD-10-CM | POA: Diagnosis not present

## 2018-11-17 DIAGNOSIS — R41841 Cognitive communication deficit: Secondary | ICD-10-CM | POA: Diagnosis not present

## 2018-11-17 DIAGNOSIS — R278 Other lack of coordination: Secondary | ICD-10-CM | POA: Diagnosis not present

## 2018-11-17 DIAGNOSIS — R2689 Other abnormalities of gait and mobility: Secondary | ICD-10-CM | POA: Diagnosis not present

## 2018-11-17 DIAGNOSIS — K219 Gastro-esophageal reflux disease without esophagitis: Secondary | ICD-10-CM | POA: Diagnosis not present

## 2018-11-17 DIAGNOSIS — J988 Other specified respiratory disorders: Secondary | ICD-10-CM | POA: Diagnosis not present

## 2018-11-17 DIAGNOSIS — E785 Hyperlipidemia, unspecified: Secondary | ICD-10-CM | POA: Diagnosis not present

## 2018-11-17 DIAGNOSIS — I5032 Chronic diastolic (congestive) heart failure: Secondary | ICD-10-CM | POA: Diagnosis not present

## 2018-11-17 DIAGNOSIS — I69391 Dysphagia following cerebral infarction: Secondary | ICD-10-CM | POA: Diagnosis not present

## 2018-11-17 DIAGNOSIS — F0150 Vascular dementia without behavioral disturbance: Secondary | ICD-10-CM | POA: Diagnosis not present

## 2018-11-18 DIAGNOSIS — J9601 Acute respiratory failure with hypoxia: Secondary | ICD-10-CM | POA: Diagnosis not present

## 2018-11-18 DIAGNOSIS — I5032 Chronic diastolic (congestive) heart failure: Secondary | ICD-10-CM | POA: Diagnosis not present

## 2018-11-18 DIAGNOSIS — I69391 Dysphagia following cerebral infarction: Secondary | ICD-10-CM | POA: Diagnosis not present

## 2018-11-18 DIAGNOSIS — F0150 Vascular dementia without behavioral disturbance: Secondary | ICD-10-CM | POA: Diagnosis not present

## 2018-11-18 DIAGNOSIS — R41841 Cognitive communication deficit: Secondary | ICD-10-CM | POA: Diagnosis not present

## 2018-11-18 DIAGNOSIS — R1312 Dysphagia, oropharyngeal phase: Secondary | ICD-10-CM | POA: Diagnosis not present

## 2018-11-18 DIAGNOSIS — J84112 Idiopathic pulmonary fibrosis: Secondary | ICD-10-CM | POA: Diagnosis not present

## 2018-11-18 DIAGNOSIS — R2689 Other abnormalities of gait and mobility: Secondary | ICD-10-CM | POA: Diagnosis not present

## 2018-11-18 DIAGNOSIS — I110 Hypertensive heart disease with heart failure: Secondary | ICD-10-CM | POA: Diagnosis not present

## 2018-11-18 DIAGNOSIS — E785 Hyperlipidemia, unspecified: Secondary | ICD-10-CM | POA: Diagnosis not present

## 2018-11-18 DIAGNOSIS — R278 Other lack of coordination: Secondary | ICD-10-CM | POA: Diagnosis not present

## 2018-11-18 DIAGNOSIS — K219 Gastro-esophageal reflux disease without esophagitis: Secondary | ICD-10-CM | POA: Diagnosis not present

## 2018-11-18 DIAGNOSIS — F329 Major depressive disorder, single episode, unspecified: Secondary | ICD-10-CM | POA: Diagnosis not present

## 2018-11-18 DIAGNOSIS — J988 Other specified respiratory disorders: Secondary | ICD-10-CM | POA: Diagnosis not present

## 2018-11-19 DIAGNOSIS — K219 Gastro-esophageal reflux disease without esophagitis: Secondary | ICD-10-CM | POA: Diagnosis not present

## 2018-11-19 DIAGNOSIS — F329 Major depressive disorder, single episode, unspecified: Secondary | ICD-10-CM | POA: Diagnosis not present

## 2018-11-19 DIAGNOSIS — F0150 Vascular dementia without behavioral disturbance: Secondary | ICD-10-CM | POA: Diagnosis not present

## 2018-11-19 DIAGNOSIS — R41841 Cognitive communication deficit: Secondary | ICD-10-CM | POA: Diagnosis not present

## 2018-11-19 DIAGNOSIS — E785 Hyperlipidemia, unspecified: Secondary | ICD-10-CM | POA: Diagnosis not present

## 2018-11-19 DIAGNOSIS — J9601 Acute respiratory failure with hypoxia: Secondary | ICD-10-CM | POA: Diagnosis not present

## 2018-11-19 DIAGNOSIS — I110 Hypertensive heart disease with heart failure: Secondary | ICD-10-CM | POA: Diagnosis not present

## 2018-11-19 DIAGNOSIS — J84112 Idiopathic pulmonary fibrosis: Secondary | ICD-10-CM | POA: Diagnosis not present

## 2018-11-19 DIAGNOSIS — R1312 Dysphagia, oropharyngeal phase: Secondary | ICD-10-CM | POA: Diagnosis not present

## 2018-11-19 DIAGNOSIS — R278 Other lack of coordination: Secondary | ICD-10-CM | POA: Diagnosis not present

## 2018-11-19 DIAGNOSIS — I5032 Chronic diastolic (congestive) heart failure: Secondary | ICD-10-CM | POA: Diagnosis not present

## 2018-11-19 DIAGNOSIS — J988 Other specified respiratory disorders: Secondary | ICD-10-CM | POA: Diagnosis not present

## 2018-11-19 DIAGNOSIS — I69391 Dysphagia following cerebral infarction: Secondary | ICD-10-CM | POA: Diagnosis not present

## 2018-11-19 DIAGNOSIS — R2689 Other abnormalities of gait and mobility: Secondary | ICD-10-CM | POA: Diagnosis not present

## 2018-11-20 DIAGNOSIS — R41841 Cognitive communication deficit: Secondary | ICD-10-CM | POA: Diagnosis not present

## 2018-11-20 DIAGNOSIS — F329 Major depressive disorder, single episode, unspecified: Secondary | ICD-10-CM | POA: Diagnosis not present

## 2018-11-20 DIAGNOSIS — R1312 Dysphagia, oropharyngeal phase: Secondary | ICD-10-CM | POA: Diagnosis not present

## 2018-11-20 DIAGNOSIS — J84112 Idiopathic pulmonary fibrosis: Secondary | ICD-10-CM | POA: Diagnosis not present

## 2018-11-20 DIAGNOSIS — J9601 Acute respiratory failure with hypoxia: Secondary | ICD-10-CM | POA: Diagnosis not present

## 2018-11-20 DIAGNOSIS — K219 Gastro-esophageal reflux disease without esophagitis: Secondary | ICD-10-CM | POA: Diagnosis not present

## 2018-11-20 DIAGNOSIS — I110 Hypertensive heart disease with heart failure: Secondary | ICD-10-CM | POA: Diagnosis not present

## 2018-11-20 DIAGNOSIS — J988 Other specified respiratory disorders: Secondary | ICD-10-CM | POA: Diagnosis not present

## 2018-11-20 DIAGNOSIS — I69391 Dysphagia following cerebral infarction: Secondary | ICD-10-CM | POA: Diagnosis not present

## 2018-11-20 DIAGNOSIS — I5032 Chronic diastolic (congestive) heart failure: Secondary | ICD-10-CM | POA: Diagnosis not present

## 2018-11-20 DIAGNOSIS — R278 Other lack of coordination: Secondary | ICD-10-CM | POA: Diagnosis not present

## 2018-11-20 DIAGNOSIS — R2689 Other abnormalities of gait and mobility: Secondary | ICD-10-CM | POA: Diagnosis not present

## 2018-11-20 DIAGNOSIS — F0150 Vascular dementia without behavioral disturbance: Secondary | ICD-10-CM | POA: Diagnosis not present

## 2018-11-20 DIAGNOSIS — E785 Hyperlipidemia, unspecified: Secondary | ICD-10-CM | POA: Diagnosis not present

## 2018-11-23 DIAGNOSIS — R278 Other lack of coordination: Secondary | ICD-10-CM | POA: Diagnosis not present

## 2018-11-23 DIAGNOSIS — R1312 Dysphagia, oropharyngeal phase: Secondary | ICD-10-CM | POA: Diagnosis not present

## 2018-11-23 DIAGNOSIS — K219 Gastro-esophageal reflux disease without esophagitis: Secondary | ICD-10-CM | POA: Diagnosis not present

## 2018-11-23 DIAGNOSIS — I110 Hypertensive heart disease with heart failure: Secondary | ICD-10-CM | POA: Diagnosis not present

## 2018-11-23 DIAGNOSIS — R2689 Other abnormalities of gait and mobility: Secondary | ICD-10-CM | POA: Diagnosis not present

## 2018-11-23 DIAGNOSIS — J988 Other specified respiratory disorders: Secondary | ICD-10-CM | POA: Diagnosis not present

## 2018-11-23 DIAGNOSIS — J84112 Idiopathic pulmonary fibrosis: Secondary | ICD-10-CM | POA: Diagnosis not present

## 2018-11-23 DIAGNOSIS — R41841 Cognitive communication deficit: Secondary | ICD-10-CM | POA: Diagnosis not present

## 2018-11-23 DIAGNOSIS — I5032 Chronic diastolic (congestive) heart failure: Secondary | ICD-10-CM | POA: Diagnosis not present

## 2018-11-23 DIAGNOSIS — F0150 Vascular dementia without behavioral disturbance: Secondary | ICD-10-CM | POA: Diagnosis not present

## 2018-11-23 DIAGNOSIS — J9601 Acute respiratory failure with hypoxia: Secondary | ICD-10-CM | POA: Diagnosis not present

## 2018-11-23 DIAGNOSIS — I69391 Dysphagia following cerebral infarction: Secondary | ICD-10-CM | POA: Diagnosis not present

## 2018-11-23 DIAGNOSIS — E785 Hyperlipidemia, unspecified: Secondary | ICD-10-CM | POA: Diagnosis not present

## 2018-11-23 DIAGNOSIS — F329 Major depressive disorder, single episode, unspecified: Secondary | ICD-10-CM | POA: Diagnosis not present

## 2018-11-24 DIAGNOSIS — I5032 Chronic diastolic (congestive) heart failure: Secondary | ICD-10-CM | POA: Diagnosis not present

## 2018-11-24 DIAGNOSIS — K219 Gastro-esophageal reflux disease without esophagitis: Secondary | ICD-10-CM | POA: Diagnosis not present

## 2018-11-24 DIAGNOSIS — F329 Major depressive disorder, single episode, unspecified: Secondary | ICD-10-CM | POA: Diagnosis not present

## 2018-11-24 DIAGNOSIS — R1312 Dysphagia, oropharyngeal phase: Secondary | ICD-10-CM | POA: Diagnosis not present

## 2018-11-24 DIAGNOSIS — J84112 Idiopathic pulmonary fibrosis: Secondary | ICD-10-CM | POA: Diagnosis not present

## 2018-11-24 DIAGNOSIS — J988 Other specified respiratory disorders: Secondary | ICD-10-CM | POA: Diagnosis not present

## 2018-11-24 DIAGNOSIS — I110 Hypertensive heart disease with heart failure: Secondary | ICD-10-CM | POA: Diagnosis not present

## 2018-11-24 DIAGNOSIS — E785 Hyperlipidemia, unspecified: Secondary | ICD-10-CM | POA: Diagnosis not present

## 2018-11-24 DIAGNOSIS — R2689 Other abnormalities of gait and mobility: Secondary | ICD-10-CM | POA: Diagnosis not present

## 2018-11-24 DIAGNOSIS — J9601 Acute respiratory failure with hypoxia: Secondary | ICD-10-CM | POA: Diagnosis not present

## 2018-11-24 DIAGNOSIS — F0150 Vascular dementia without behavioral disturbance: Secondary | ICD-10-CM | POA: Diagnosis not present

## 2018-11-24 DIAGNOSIS — I69391 Dysphagia following cerebral infarction: Secondary | ICD-10-CM | POA: Diagnosis not present

## 2018-11-24 DIAGNOSIS — R278 Other lack of coordination: Secondary | ICD-10-CM | POA: Diagnosis not present

## 2018-11-24 DIAGNOSIS — R41841 Cognitive communication deficit: Secondary | ICD-10-CM | POA: Diagnosis not present

## 2018-11-25 DIAGNOSIS — I69391 Dysphagia following cerebral infarction: Secondary | ICD-10-CM | POA: Diagnosis not present

## 2018-11-25 DIAGNOSIS — K219 Gastro-esophageal reflux disease without esophagitis: Secondary | ICD-10-CM | POA: Diagnosis not present

## 2018-11-25 DIAGNOSIS — E785 Hyperlipidemia, unspecified: Secondary | ICD-10-CM | POA: Diagnosis not present

## 2018-11-25 DIAGNOSIS — I110 Hypertensive heart disease with heart failure: Secondary | ICD-10-CM | POA: Diagnosis not present

## 2018-11-25 DIAGNOSIS — J84112 Idiopathic pulmonary fibrosis: Secondary | ICD-10-CM | POA: Diagnosis not present

## 2018-11-25 DIAGNOSIS — Z20828 Contact with and (suspected) exposure to other viral communicable diseases: Secondary | ICD-10-CM | POA: Diagnosis not present

## 2018-11-25 DIAGNOSIS — I5032 Chronic diastolic (congestive) heart failure: Secondary | ICD-10-CM | POA: Diagnosis not present

## 2018-11-25 DIAGNOSIS — J988 Other specified respiratory disorders: Secondary | ICD-10-CM | POA: Diagnosis not present

## 2018-11-25 DIAGNOSIS — F0150 Vascular dementia without behavioral disturbance: Secondary | ICD-10-CM | POA: Diagnosis not present

## 2018-11-25 DIAGNOSIS — R278 Other lack of coordination: Secondary | ICD-10-CM | POA: Diagnosis not present

## 2018-11-25 DIAGNOSIS — F329 Major depressive disorder, single episode, unspecified: Secondary | ICD-10-CM | POA: Diagnosis not present

## 2018-11-25 DIAGNOSIS — R2689 Other abnormalities of gait and mobility: Secondary | ICD-10-CM | POA: Diagnosis not present

## 2018-11-25 DIAGNOSIS — J9601 Acute respiratory failure with hypoxia: Secondary | ICD-10-CM | POA: Diagnosis not present

## 2018-11-25 DIAGNOSIS — R41841 Cognitive communication deficit: Secondary | ICD-10-CM | POA: Diagnosis not present

## 2018-11-25 DIAGNOSIS — R1312 Dysphagia, oropharyngeal phase: Secondary | ICD-10-CM | POA: Diagnosis not present

## 2018-11-26 DIAGNOSIS — R1312 Dysphagia, oropharyngeal phase: Secondary | ICD-10-CM | POA: Diagnosis not present

## 2018-11-26 DIAGNOSIS — J84112 Idiopathic pulmonary fibrosis: Secondary | ICD-10-CM | POA: Diagnosis not present

## 2018-11-26 DIAGNOSIS — F329 Major depressive disorder, single episode, unspecified: Secondary | ICD-10-CM | POA: Diagnosis not present

## 2018-11-26 DIAGNOSIS — E785 Hyperlipidemia, unspecified: Secondary | ICD-10-CM | POA: Diagnosis not present

## 2018-11-26 DIAGNOSIS — R278 Other lack of coordination: Secondary | ICD-10-CM | POA: Diagnosis not present

## 2018-11-26 DIAGNOSIS — J9601 Acute respiratory failure with hypoxia: Secondary | ICD-10-CM | POA: Diagnosis not present

## 2018-11-26 DIAGNOSIS — F0150 Vascular dementia without behavioral disturbance: Secondary | ICD-10-CM | POA: Diagnosis not present

## 2018-11-26 DIAGNOSIS — R41841 Cognitive communication deficit: Secondary | ICD-10-CM | POA: Diagnosis not present

## 2018-11-26 DIAGNOSIS — I69391 Dysphagia following cerebral infarction: Secondary | ICD-10-CM | POA: Diagnosis not present

## 2018-11-26 DIAGNOSIS — I5032 Chronic diastolic (congestive) heart failure: Secondary | ICD-10-CM | POA: Diagnosis not present

## 2018-11-26 DIAGNOSIS — J988 Other specified respiratory disorders: Secondary | ICD-10-CM | POA: Diagnosis not present

## 2018-11-26 DIAGNOSIS — R2689 Other abnormalities of gait and mobility: Secondary | ICD-10-CM | POA: Diagnosis not present

## 2018-11-26 DIAGNOSIS — I110 Hypertensive heart disease with heart failure: Secondary | ICD-10-CM | POA: Diagnosis not present

## 2018-11-26 DIAGNOSIS — K219 Gastro-esophageal reflux disease without esophagitis: Secondary | ICD-10-CM | POA: Diagnosis not present

## 2018-11-27 DIAGNOSIS — I5032 Chronic diastolic (congestive) heart failure: Secondary | ICD-10-CM | POA: Diagnosis not present

## 2018-11-27 DIAGNOSIS — F329 Major depressive disorder, single episode, unspecified: Secondary | ICD-10-CM | POA: Diagnosis not present

## 2018-11-27 DIAGNOSIS — J988 Other specified respiratory disorders: Secondary | ICD-10-CM | POA: Diagnosis not present

## 2018-11-27 DIAGNOSIS — R278 Other lack of coordination: Secondary | ICD-10-CM | POA: Diagnosis not present

## 2018-11-27 DIAGNOSIS — R41841 Cognitive communication deficit: Secondary | ICD-10-CM | POA: Diagnosis not present

## 2018-11-27 DIAGNOSIS — K219 Gastro-esophageal reflux disease without esophagitis: Secondary | ICD-10-CM | POA: Diagnosis not present

## 2018-11-27 DIAGNOSIS — E785 Hyperlipidemia, unspecified: Secondary | ICD-10-CM | POA: Diagnosis not present

## 2018-11-27 DIAGNOSIS — I69391 Dysphagia following cerebral infarction: Secondary | ICD-10-CM | POA: Diagnosis not present

## 2018-11-27 DIAGNOSIS — R2689 Other abnormalities of gait and mobility: Secondary | ICD-10-CM | POA: Diagnosis not present

## 2018-11-27 DIAGNOSIS — J9601 Acute respiratory failure with hypoxia: Secondary | ICD-10-CM | POA: Diagnosis not present

## 2018-11-27 DIAGNOSIS — F0150 Vascular dementia without behavioral disturbance: Secondary | ICD-10-CM | POA: Diagnosis not present

## 2018-11-27 DIAGNOSIS — I110 Hypertensive heart disease with heart failure: Secondary | ICD-10-CM | POA: Diagnosis not present

## 2018-11-27 DIAGNOSIS — J84112 Idiopathic pulmonary fibrosis: Secondary | ICD-10-CM | POA: Diagnosis not present

## 2018-11-27 DIAGNOSIS — R1312 Dysphagia, oropharyngeal phase: Secondary | ICD-10-CM | POA: Diagnosis not present

## 2018-12-02 DIAGNOSIS — Z20828 Contact with and (suspected) exposure to other viral communicable diseases: Secondary | ICD-10-CM | POA: Diagnosis not present

## 2018-12-09 DIAGNOSIS — Z20828 Contact with and (suspected) exposure to other viral communicable diseases: Secondary | ICD-10-CM | POA: Diagnosis not present

## 2018-12-11 DIAGNOSIS — I5030 Unspecified diastolic (congestive) heart failure: Secondary | ICD-10-CM | POA: Diagnosis not present

## 2018-12-11 DIAGNOSIS — F322 Major depressive disorder, single episode, severe without psychotic features: Secondary | ICD-10-CM | POA: Diagnosis not present

## 2018-12-11 DIAGNOSIS — I10 Essential (primary) hypertension: Secondary | ICD-10-CM | POA: Diagnosis not present

## 2018-12-11 DIAGNOSIS — F0151 Vascular dementia with behavioral disturbance: Secondary | ICD-10-CM | POA: Diagnosis not present

## 2018-12-16 DIAGNOSIS — R1312 Dysphagia, oropharyngeal phase: Secondary | ICD-10-CM | POA: Diagnosis not present

## 2018-12-16 DIAGNOSIS — R41841 Cognitive communication deficit: Secondary | ICD-10-CM | POA: Diagnosis not present

## 2018-12-16 DIAGNOSIS — K219 Gastro-esophageal reflux disease without esophagitis: Secondary | ICD-10-CM | POA: Diagnosis not present

## 2018-12-16 DIAGNOSIS — I10 Essential (primary) hypertension: Secondary | ICD-10-CM | POA: Diagnosis not present

## 2018-12-16 DIAGNOSIS — J9601 Acute respiratory failure with hypoxia: Secondary | ICD-10-CM | POA: Diagnosis not present

## 2018-12-16 DIAGNOSIS — J988 Other specified respiratory disorders: Secondary | ICD-10-CM | POA: Diagnosis not present

## 2018-12-16 DIAGNOSIS — R278 Other lack of coordination: Secondary | ICD-10-CM | POA: Diagnosis not present

## 2018-12-16 DIAGNOSIS — F329 Major depressive disorder, single episode, unspecified: Secondary | ICD-10-CM | POA: Diagnosis not present

## 2018-12-16 DIAGNOSIS — R2689 Other abnormalities of gait and mobility: Secondary | ICD-10-CM | POA: Diagnosis not present

## 2018-12-16 DIAGNOSIS — I110 Hypertensive heart disease with heart failure: Secondary | ICD-10-CM | POA: Diagnosis not present

## 2018-12-16 DIAGNOSIS — I69391 Dysphagia following cerebral infarction: Secondary | ICD-10-CM | POA: Diagnosis not present

## 2018-12-16 DIAGNOSIS — I6389 Other cerebral infarction: Secondary | ICD-10-CM | POA: Diagnosis not present

## 2018-12-16 DIAGNOSIS — F0150 Vascular dementia without behavioral disturbance: Secondary | ICD-10-CM | POA: Diagnosis not present

## 2018-12-16 DIAGNOSIS — R634 Abnormal weight loss: Secondary | ICD-10-CM | POA: Diagnosis not present

## 2018-12-16 DIAGNOSIS — J84112 Idiopathic pulmonary fibrosis: Secondary | ICD-10-CM | POA: Diagnosis not present

## 2018-12-16 DIAGNOSIS — E785 Hyperlipidemia, unspecified: Secondary | ICD-10-CM | POA: Diagnosis not present

## 2018-12-16 DIAGNOSIS — I5032 Chronic diastolic (congestive) heart failure: Secondary | ICD-10-CM | POA: Diagnosis not present

## 2018-12-16 DIAGNOSIS — Z20828 Contact with and (suspected) exposure to other viral communicable diseases: Secondary | ICD-10-CM | POA: Diagnosis not present

## 2018-12-17 DIAGNOSIS — F0150 Vascular dementia without behavioral disturbance: Secondary | ICD-10-CM | POA: Diagnosis not present

## 2018-12-17 DIAGNOSIS — R41841 Cognitive communication deficit: Secondary | ICD-10-CM | POA: Diagnosis not present

## 2018-12-17 DIAGNOSIS — J988 Other specified respiratory disorders: Secondary | ICD-10-CM | POA: Diagnosis not present

## 2018-12-17 DIAGNOSIS — K219 Gastro-esophageal reflux disease without esophagitis: Secondary | ICD-10-CM | POA: Diagnosis not present

## 2018-12-17 DIAGNOSIS — R278 Other lack of coordination: Secondary | ICD-10-CM | POA: Diagnosis not present

## 2018-12-17 DIAGNOSIS — R1312 Dysphagia, oropharyngeal phase: Secondary | ICD-10-CM | POA: Diagnosis not present

## 2018-12-17 DIAGNOSIS — J84112 Idiopathic pulmonary fibrosis: Secondary | ICD-10-CM | POA: Diagnosis not present

## 2018-12-17 DIAGNOSIS — F329 Major depressive disorder, single episode, unspecified: Secondary | ICD-10-CM | POA: Diagnosis not present

## 2018-12-17 DIAGNOSIS — I69391 Dysphagia following cerebral infarction: Secondary | ICD-10-CM | POA: Diagnosis not present

## 2018-12-17 DIAGNOSIS — I5032 Chronic diastolic (congestive) heart failure: Secondary | ICD-10-CM | POA: Diagnosis not present

## 2018-12-17 DIAGNOSIS — E785 Hyperlipidemia, unspecified: Secondary | ICD-10-CM | POA: Diagnosis not present

## 2018-12-17 DIAGNOSIS — I110 Hypertensive heart disease with heart failure: Secondary | ICD-10-CM | POA: Diagnosis not present

## 2018-12-17 DIAGNOSIS — R2689 Other abnormalities of gait and mobility: Secondary | ICD-10-CM | POA: Diagnosis not present

## 2018-12-17 DIAGNOSIS — J9601 Acute respiratory failure with hypoxia: Secondary | ICD-10-CM | POA: Diagnosis not present

## 2018-12-18 DIAGNOSIS — E785 Hyperlipidemia, unspecified: Secondary | ICD-10-CM | POA: Diagnosis not present

## 2018-12-18 DIAGNOSIS — J988 Other specified respiratory disorders: Secondary | ICD-10-CM | POA: Diagnosis not present

## 2018-12-18 DIAGNOSIS — I5032 Chronic diastolic (congestive) heart failure: Secondary | ICD-10-CM | POA: Diagnosis not present

## 2018-12-18 DIAGNOSIS — F0150 Vascular dementia without behavioral disturbance: Secondary | ICD-10-CM | POA: Diagnosis not present

## 2018-12-18 DIAGNOSIS — R2689 Other abnormalities of gait and mobility: Secondary | ICD-10-CM | POA: Diagnosis not present

## 2018-12-18 DIAGNOSIS — R278 Other lack of coordination: Secondary | ICD-10-CM | POA: Diagnosis not present

## 2018-12-18 DIAGNOSIS — R41841 Cognitive communication deficit: Secondary | ICD-10-CM | POA: Diagnosis not present

## 2018-12-18 DIAGNOSIS — J9601 Acute respiratory failure with hypoxia: Secondary | ICD-10-CM | POA: Diagnosis not present

## 2018-12-18 DIAGNOSIS — K219 Gastro-esophageal reflux disease without esophagitis: Secondary | ICD-10-CM | POA: Diagnosis not present

## 2018-12-18 DIAGNOSIS — R1312 Dysphagia, oropharyngeal phase: Secondary | ICD-10-CM | POA: Diagnosis not present

## 2018-12-18 DIAGNOSIS — I110 Hypertensive heart disease with heart failure: Secondary | ICD-10-CM | POA: Diagnosis not present

## 2018-12-18 DIAGNOSIS — F329 Major depressive disorder, single episode, unspecified: Secondary | ICD-10-CM | POA: Diagnosis not present

## 2018-12-18 DIAGNOSIS — J84112 Idiopathic pulmonary fibrosis: Secondary | ICD-10-CM | POA: Diagnosis not present

## 2018-12-18 DIAGNOSIS — I69391 Dysphagia following cerebral infarction: Secondary | ICD-10-CM | POA: Diagnosis not present

## 2018-12-21 DIAGNOSIS — J988 Other specified respiratory disorders: Secondary | ICD-10-CM | POA: Diagnosis not present

## 2018-12-21 DIAGNOSIS — I69391 Dysphagia following cerebral infarction: Secondary | ICD-10-CM | POA: Diagnosis not present

## 2018-12-21 DIAGNOSIS — F329 Major depressive disorder, single episode, unspecified: Secondary | ICD-10-CM | POA: Diagnosis not present

## 2018-12-21 DIAGNOSIS — J84112 Idiopathic pulmonary fibrosis: Secondary | ICD-10-CM | POA: Diagnosis not present

## 2018-12-21 DIAGNOSIS — R278 Other lack of coordination: Secondary | ICD-10-CM | POA: Diagnosis not present

## 2018-12-21 DIAGNOSIS — K219 Gastro-esophageal reflux disease without esophagitis: Secondary | ICD-10-CM | POA: Diagnosis not present

## 2018-12-21 DIAGNOSIS — R2689 Other abnormalities of gait and mobility: Secondary | ICD-10-CM | POA: Diagnosis not present

## 2018-12-21 DIAGNOSIS — R41841 Cognitive communication deficit: Secondary | ICD-10-CM | POA: Diagnosis not present

## 2018-12-21 DIAGNOSIS — J9601 Acute respiratory failure with hypoxia: Secondary | ICD-10-CM | POA: Diagnosis not present

## 2018-12-21 DIAGNOSIS — E785 Hyperlipidemia, unspecified: Secondary | ICD-10-CM | POA: Diagnosis not present

## 2018-12-21 DIAGNOSIS — I5032 Chronic diastolic (congestive) heart failure: Secondary | ICD-10-CM | POA: Diagnosis not present

## 2018-12-21 DIAGNOSIS — R1312 Dysphagia, oropharyngeal phase: Secondary | ICD-10-CM | POA: Diagnosis not present

## 2018-12-21 DIAGNOSIS — F0150 Vascular dementia without behavioral disturbance: Secondary | ICD-10-CM | POA: Diagnosis not present

## 2018-12-21 DIAGNOSIS — I110 Hypertensive heart disease with heart failure: Secondary | ICD-10-CM | POA: Diagnosis not present

## 2018-12-22 DIAGNOSIS — K219 Gastro-esophageal reflux disease without esophagitis: Secondary | ICD-10-CM | POA: Diagnosis not present

## 2018-12-22 DIAGNOSIS — I5032 Chronic diastolic (congestive) heart failure: Secondary | ICD-10-CM | POA: Diagnosis not present

## 2018-12-22 DIAGNOSIS — J84112 Idiopathic pulmonary fibrosis: Secondary | ICD-10-CM | POA: Diagnosis not present

## 2018-12-22 DIAGNOSIS — F329 Major depressive disorder, single episode, unspecified: Secondary | ICD-10-CM | POA: Diagnosis not present

## 2018-12-22 DIAGNOSIS — J9601 Acute respiratory failure with hypoxia: Secondary | ICD-10-CM | POA: Diagnosis not present

## 2018-12-22 DIAGNOSIS — I69391 Dysphagia following cerebral infarction: Secondary | ICD-10-CM | POA: Diagnosis not present

## 2018-12-22 DIAGNOSIS — E785 Hyperlipidemia, unspecified: Secondary | ICD-10-CM | POA: Diagnosis not present

## 2018-12-22 DIAGNOSIS — R2689 Other abnormalities of gait and mobility: Secondary | ICD-10-CM | POA: Diagnosis not present

## 2018-12-22 DIAGNOSIS — J988 Other specified respiratory disorders: Secondary | ICD-10-CM | POA: Diagnosis not present

## 2018-12-22 DIAGNOSIS — I110 Hypertensive heart disease with heart failure: Secondary | ICD-10-CM | POA: Diagnosis not present

## 2018-12-22 DIAGNOSIS — R278 Other lack of coordination: Secondary | ICD-10-CM | POA: Diagnosis not present

## 2018-12-22 DIAGNOSIS — F0150 Vascular dementia without behavioral disturbance: Secondary | ICD-10-CM | POA: Diagnosis not present

## 2018-12-22 DIAGNOSIS — R1312 Dysphagia, oropharyngeal phase: Secondary | ICD-10-CM | POA: Diagnosis not present

## 2018-12-22 DIAGNOSIS — R41841 Cognitive communication deficit: Secondary | ICD-10-CM | POA: Diagnosis not present

## 2018-12-23 DIAGNOSIS — R1312 Dysphagia, oropharyngeal phase: Secondary | ICD-10-CM | POA: Diagnosis not present

## 2018-12-23 DIAGNOSIS — J84112 Idiopathic pulmonary fibrosis: Secondary | ICD-10-CM | POA: Diagnosis not present

## 2018-12-23 DIAGNOSIS — F0150 Vascular dementia without behavioral disturbance: Secondary | ICD-10-CM | POA: Diagnosis not present

## 2018-12-23 DIAGNOSIS — I69391 Dysphagia following cerebral infarction: Secondary | ICD-10-CM | POA: Diagnosis not present

## 2018-12-23 DIAGNOSIS — F329 Major depressive disorder, single episode, unspecified: Secondary | ICD-10-CM | POA: Diagnosis not present

## 2018-12-23 DIAGNOSIS — I110 Hypertensive heart disease with heart failure: Secondary | ICD-10-CM | POA: Diagnosis not present

## 2018-12-23 DIAGNOSIS — R41841 Cognitive communication deficit: Secondary | ICD-10-CM | POA: Diagnosis not present

## 2018-12-23 DIAGNOSIS — J9601 Acute respiratory failure with hypoxia: Secondary | ICD-10-CM | POA: Diagnosis not present

## 2018-12-23 DIAGNOSIS — R2689 Other abnormalities of gait and mobility: Secondary | ICD-10-CM | POA: Diagnosis not present

## 2018-12-23 DIAGNOSIS — K219 Gastro-esophageal reflux disease without esophagitis: Secondary | ICD-10-CM | POA: Diagnosis not present

## 2018-12-23 DIAGNOSIS — E785 Hyperlipidemia, unspecified: Secondary | ICD-10-CM | POA: Diagnosis not present

## 2018-12-23 DIAGNOSIS — R278 Other lack of coordination: Secondary | ICD-10-CM | POA: Diagnosis not present

## 2018-12-23 DIAGNOSIS — J988 Other specified respiratory disorders: Secondary | ICD-10-CM | POA: Diagnosis not present

## 2018-12-23 DIAGNOSIS — I5032 Chronic diastolic (congestive) heart failure: Secondary | ICD-10-CM | POA: Diagnosis not present

## 2018-12-24 DIAGNOSIS — F0150 Vascular dementia without behavioral disturbance: Secondary | ICD-10-CM | POA: Diagnosis not present

## 2018-12-24 DIAGNOSIS — I69391 Dysphagia following cerebral infarction: Secondary | ICD-10-CM | POA: Diagnosis not present

## 2018-12-24 DIAGNOSIS — R41841 Cognitive communication deficit: Secondary | ICD-10-CM | POA: Diagnosis not present

## 2018-12-24 DIAGNOSIS — J9601 Acute respiratory failure with hypoxia: Secondary | ICD-10-CM | POA: Diagnosis not present

## 2018-12-24 DIAGNOSIS — J988 Other specified respiratory disorders: Secondary | ICD-10-CM | POA: Diagnosis not present

## 2018-12-24 DIAGNOSIS — R1312 Dysphagia, oropharyngeal phase: Secondary | ICD-10-CM | POA: Diagnosis not present

## 2018-12-24 DIAGNOSIS — F329 Major depressive disorder, single episode, unspecified: Secondary | ICD-10-CM | POA: Diagnosis not present

## 2018-12-24 DIAGNOSIS — R2689 Other abnormalities of gait and mobility: Secondary | ICD-10-CM | POA: Diagnosis not present

## 2018-12-24 DIAGNOSIS — J84112 Idiopathic pulmonary fibrosis: Secondary | ICD-10-CM | POA: Diagnosis not present

## 2018-12-24 DIAGNOSIS — I110 Hypertensive heart disease with heart failure: Secondary | ICD-10-CM | POA: Diagnosis not present

## 2018-12-24 DIAGNOSIS — I5032 Chronic diastolic (congestive) heart failure: Secondary | ICD-10-CM | POA: Diagnosis not present

## 2018-12-24 DIAGNOSIS — E785 Hyperlipidemia, unspecified: Secondary | ICD-10-CM | POA: Diagnosis not present

## 2018-12-24 DIAGNOSIS — K219 Gastro-esophageal reflux disease without esophagitis: Secondary | ICD-10-CM | POA: Diagnosis not present

## 2018-12-24 DIAGNOSIS — R278 Other lack of coordination: Secondary | ICD-10-CM | POA: Diagnosis not present

## 2018-12-25 DIAGNOSIS — F0150 Vascular dementia without behavioral disturbance: Secondary | ICD-10-CM | POA: Diagnosis not present

## 2018-12-25 DIAGNOSIS — J988 Other specified respiratory disorders: Secondary | ICD-10-CM | POA: Diagnosis not present

## 2018-12-25 DIAGNOSIS — E785 Hyperlipidemia, unspecified: Secondary | ICD-10-CM | POA: Diagnosis not present

## 2018-12-25 DIAGNOSIS — R278 Other lack of coordination: Secondary | ICD-10-CM | POA: Diagnosis not present

## 2018-12-25 DIAGNOSIS — F329 Major depressive disorder, single episode, unspecified: Secondary | ICD-10-CM | POA: Diagnosis not present

## 2018-12-25 DIAGNOSIS — J9601 Acute respiratory failure with hypoxia: Secondary | ICD-10-CM | POA: Diagnosis not present

## 2018-12-25 DIAGNOSIS — I5032 Chronic diastolic (congestive) heart failure: Secondary | ICD-10-CM | POA: Diagnosis not present

## 2018-12-25 DIAGNOSIS — R1312 Dysphagia, oropharyngeal phase: Secondary | ICD-10-CM | POA: Diagnosis not present

## 2018-12-25 DIAGNOSIS — J84112 Idiopathic pulmonary fibrosis: Secondary | ICD-10-CM | POA: Diagnosis not present

## 2018-12-25 DIAGNOSIS — R2689 Other abnormalities of gait and mobility: Secondary | ICD-10-CM | POA: Diagnosis not present

## 2018-12-25 DIAGNOSIS — R41841 Cognitive communication deficit: Secondary | ICD-10-CM | POA: Diagnosis not present

## 2018-12-25 DIAGNOSIS — K219 Gastro-esophageal reflux disease without esophagitis: Secondary | ICD-10-CM | POA: Diagnosis not present

## 2018-12-25 DIAGNOSIS — I69391 Dysphagia following cerebral infarction: Secondary | ICD-10-CM | POA: Diagnosis not present

## 2018-12-25 DIAGNOSIS — I110 Hypertensive heart disease with heart failure: Secondary | ICD-10-CM | POA: Diagnosis not present

## 2018-12-27 ENCOUNTER — Emergency Department (HOSPITAL_COMMUNITY)
Admission: EM | Admit: 2018-12-27 | Discharge: 2018-12-27 | Disposition: A | Discharge status: Home / Self Care | Payer: Medicare Other | Attending: Emergency Medicine | Admitting: Emergency Medicine

## 2018-12-27 ENCOUNTER — Other Ambulatory Visit: Payer: Self-pay

## 2018-12-27 ENCOUNTER — Emergency Department (HOSPITAL_COMMUNITY): Payer: Medicare Other

## 2018-12-27 ENCOUNTER — Encounter (HOSPITAL_COMMUNITY): Payer: Self-pay | Admitting: Obstetrics and Gynecology

## 2018-12-27 DIAGNOSIS — Z91040 Latex allergy status: Secondary | ICD-10-CM | POA: Diagnosis not present

## 2018-12-27 DIAGNOSIS — I679 Cerebrovascular disease, unspecified: Secondary | ICD-10-CM | POA: Diagnosis not present

## 2018-12-27 DIAGNOSIS — E119 Type 2 diabetes mellitus without complications: Secondary | ICD-10-CM | POA: Diagnosis not present

## 2018-12-27 DIAGNOSIS — J45909 Unspecified asthma, uncomplicated: Secondary | ICD-10-CM | POA: Insufficient documentation

## 2018-12-27 DIAGNOSIS — R5381 Other malaise: Secondary | ICD-10-CM | POA: Diagnosis not present

## 2018-12-27 DIAGNOSIS — Y939 Activity, unspecified: Secondary | ICD-10-CM | POA: Diagnosis not present

## 2018-12-27 DIAGNOSIS — R0902 Hypoxemia: Secondary | ICD-10-CM | POA: Diagnosis not present

## 2018-12-27 DIAGNOSIS — M2550 Pain in unspecified joint: Secondary | ICD-10-CM | POA: Diagnosis not present

## 2018-12-27 DIAGNOSIS — F0390 Unspecified dementia without behavioral disturbance: Secondary | ICD-10-CM | POA: Diagnosis not present

## 2018-12-27 DIAGNOSIS — I5032 Chronic diastolic (congestive) heart failure: Secondary | ICD-10-CM | POA: Diagnosis not present

## 2018-12-27 DIAGNOSIS — Y999 Unspecified external cause status: Secondary | ICD-10-CM | POA: Insufficient documentation

## 2018-12-27 DIAGNOSIS — Z7401 Bed confinement status: Secondary | ICD-10-CM | POA: Diagnosis not present

## 2018-12-27 DIAGNOSIS — S0003XA Contusion of scalp, initial encounter: Secondary | ICD-10-CM | POA: Insufficient documentation

## 2018-12-27 DIAGNOSIS — S064X0A Epidural hemorrhage without loss of consciousness, initial encounter: Secondary | ICD-10-CM | POA: Diagnosis not present

## 2018-12-27 DIAGNOSIS — Y92129 Unspecified place in nursing home as the place of occurrence of the external cause: Secondary | ICD-10-CM | POA: Diagnosis not present

## 2018-12-27 DIAGNOSIS — I10 Essential (primary) hypertension: Secondary | ICD-10-CM | POA: Diagnosis not present

## 2018-12-27 DIAGNOSIS — I110 Hypertensive heart disease with heart failure: Secondary | ICD-10-CM | POA: Insufficient documentation

## 2018-12-27 DIAGNOSIS — W19XXXA Unspecified fall, initial encounter: Secondary | ICD-10-CM | POA: Diagnosis not present

## 2018-12-27 DIAGNOSIS — Z8673 Personal history of transient ischemic attack (TIA), and cerebral infarction without residual deficits: Secondary | ICD-10-CM | POA: Insufficient documentation

## 2018-12-27 DIAGNOSIS — S199XXA Unspecified injury of neck, initial encounter: Secondary | ICD-10-CM | POA: Diagnosis not present

## 2018-12-27 DIAGNOSIS — Z7902 Long term (current) use of antithrombotics/antiplatelets: Secondary | ICD-10-CM | POA: Diagnosis not present

## 2018-12-27 DIAGNOSIS — G319 Degenerative disease of nervous system, unspecified: Secondary | ICD-10-CM | POA: Diagnosis not present

## 2018-12-27 DIAGNOSIS — S0990XA Unspecified injury of head, initial encounter: Secondary | ICD-10-CM | POA: Diagnosis not present

## 2018-12-27 NOTE — ED Provider Notes (Signed)
[facility] DEPT Provider Note   CSN: [id number] Arrival date & time: 12/27/18  1637    History   Chief Complaint Chief Complaint  Patient presents with   Fall   Head Injury    HPI Carmen Duncan is a 78 y.o. female.     HPI Patient presents after unwitnessed fall.  States she fell backwards and struck her head on the night table.  Has a hematoma to the right posterior scalp.  She denies any loss of consciousness.  Patient does take Plavix.  She denies any focal weakness or numbness.  No visual changes. Past Medical History:  Diagnosis Date   Acoustic neuroma ([place])    Anxiety    Arthritis    Asthma    Brain cancer ([place])    Breast cancer of upper-outer quadrant of right female breast ([place]) 07/19/2015   Diabetes mellitus without complication (HCC)    Diverticulosis    Ejection fraction    Essential tremor    on inderal   GERD (gastroesophageal reflux disease)    HOH (hard of hearing) left ear   Hyperlipidemia    Hypertension    IBS (irritable bowel syndrome)    Interstitial lung disease ([place])    Pneumonia    04-2014, CAP   Stroke ([place])    no deficits, on plavix    Patient Active Problem List   Diagnosis Date Noted   PNA (pneumonia) 06/22/2018   Acute respiratory failure with hypoxia ([place]) 06/22/2018   Adult failure to thrive 02/09/2018   Depression 07/28/2017   Weak 07/24/2017   Vascular dementia without behavioral disturbance ([place]) 03/14/2016   Asthma    CVA (cerebral infarction) 11/17/2015   Left hemiparesis ([place])    Acute CVA (cerebrovascular accident) ([place]) 11/15/2015   CVA (cerebral vascular accident) ([place]) 11/14/2015   Left-sided muscle weakness 11/14/2015   Left-sided weakness 11/14/2015   Breast cancer of upper-outer quadrant of right female breast ([place]) 07/19/2015   Vitamin B 12 deficiency 03/14/2015   Bilateral carotid artery disease ([place]) 10/07/2014   Pulmonary hypertension ([place])  10/07/2014   Ejection fraction    Cryptogenic stroke ([place]) 10/04/2014   Oral candidiasis 04/28/2014   Chronic diastolic congestive heart failure ([place]) 04/28/2014   IPF (idiopathic pulmonary fibrosis) ([place]) 04/21/2014   Hyponatremia 04/19/2014   Thrombocytopenia ([place]) 04/19/2014   Malnutrition of moderate degree ([place]) 04/19/2014   Hyperlipidemia 04/18/2014   Hypovolemia due to dehydration 04/18/2014   Dyspnea on exertion 04/04/2014   Abnormal nuclear stress test 04/04/2014   HOH (hard of hearing)    Raynaud's phenomenon 03/21/2014   Tremor, essential 03/12/2013   Hereditary and idiopathic peripheral neuropathy 08/04/2012   Squamous cell skin cancer 08/19/2011   ACOUSTIC NEUROMA 04/03/2010   Dyslipidemia 04/03/2010   Essential hypertension 04/03/2010   GERD 04/03/2010   IBS 04/03/2010   DEEP VENOUS THROMBOPHLEBITIS, HX OF 04/03/2010   DIVERTICULITIS, HX OF 04/03/2010    Past Surgical History:  Procedure Laterality Date   ABDOMINAL HYSTERECTOMY  1980   BIOPSY BREAST     BREAST LUMPECTOMY WITH RADIOACTIVE SEED AND SENTINEL LYMPH NODE BIOPSY Right 08/10/2015   Procedure: BREAST LUMPECTOMY WITH RADIOACTIVE SEED AND SENTINEL LYMPH NODE BIOPSY;  Surgeon: Autumn Messing III, MD;  Location: [facility];  Service: General;  Laterality: Right;   CHOLECYSTECTOMY  2011   EP IMPLANTABLE DEVICE N/A 11/16/2015   Procedure: Loop Recorder Insertion;  Surgeon: [name], MD;  Location: [facility];  Service:  Cardiovascular;  Laterality: N/A;   eyes  2007   cararacts   TEE WITHOUT CARDIOVERSION N/A 09/05/2014   Procedure: TRANSESOPHAGEAL ECHOCARDIOGRAM (TEE);  Surgeon: Lelon Perla, MD;  Location: [facility];  Service: Cardiovascular;  Laterality: N/A;   TEE WITHOUT CARDIOVERSION N/A 11/16/2015   Procedure: TRANSESOPHAGEAL ECHOCARDIOGRAM (TEE);  Surgeon: Larey Dresser, MD;  Location: [facility];  Service: Cardiovascular;  Laterality: N/A;     TONSILLECTOMY  1945     OB History   No obstetric history on file.      Home Medications    Prior to Admission medications   Medication Sig Start Date End Date Taking? Authorizing Provider  acetaminophen (TYLENOL) 325 MG tablet Take 650 mg by mouth every 6 (six) hours as needed for headache (pain).     [provider]  amLODipine (NORVASC) 2.5 MG tablet TAKE 1 TABLET BY MOUTH ONCE DAILY. Patient taking differently: Take 2.5 mg by mouth daily.  05/25/18   Burchette, Alinda Sierras, MD  atorvastatin (LIPITOR) 20 MG tablet TAKE 1 TABLET BY MOUTH ONCE DAILY Patient taking differently: Take 20 mg by mouth daily.  03/02/18   Burchette, Alinda Sierras, MD  clopidogrel (PLAVIX) 75 MG tablet Take 1 tablet (75 mg total) by mouth daily. 05/12/18   Tat, Eustace Quail, DO  escitalopram (LEXAPRO) 10 MG tablet Take 1 tablet (10 mg total) by mouth daily. 05/12/18   Tat, Eustace Quail, DO  Fish Oil-Cholecalciferol (OMEGA-3 FISH OIL/VITAMIN D3) 1000-1000 MG-UNIT CAPS Take 1,000 Units by mouth.    [provider]  ondansetron (ZOFRAN) 4 MG tablet Take 1 tablet (4 mg total) by mouth every 8 (eight) hours as needed for nausea or vomiting. 03/29/16   Rigoberto Noel, MD  pantoprazole (PROTONIX) 20 MG tablet Take 1 tablet (20 mg total) by mouth daily. 05/13/18   Burchette, Alinda Sierras, MD  propranolol (INDERAL) 20 MG tablet Take 1 tablet (20 mg total) by mouth daily. 05/12/18   Tat, Eustace Quail, DO  vitamin B-12 (CYANOCOBALAMIN) 1000 MCG tablet Take 1,000 mcg by mouth daily.    [provider]    Family History Family History  Problem Relation Age of Onset   Hyperlipidemia Mother    Hypertension Mother    Hyperlipidemia Father    Heart disease Father 71   Stroke Father    Lung cancer Paternal Uncle        x 4    Social History Social History   Tobacco Use   Smoking status: Never Smoker   Smokeless tobacco: Never Used  Substance Use Topics   Alcohol use: Yes    Alcohol/week: 0.0  standard drinks    Comment: wine about 2 times a week    Drug use: No     Allergies   Sulfa antibiotics, Latex, Sulfonamide derivatives, and Tape   Review of Systems Review of Systems  Constitutional: Negative for chills and fever.  HENT: Negative for sore throat and trouble swallowing.   Respiratory: Negative for cough and shortness of breath.   Cardiovascular: Negative for chest pain, palpitations and leg swelling.  Gastrointestinal: Negative for abdominal pain, constipation, diarrhea, nausea and vomiting.  Genitourinary: Negative for dysuria and flank pain.  Musculoskeletal: Negative for back pain, myalgias and neck pain.  Skin: Negative for rash and wound.  Neurological: Positive for headaches. Negative for dizziness, speech difficulty, weakness, light-headedness and numbness.  All other systems reviewed and are negative.    Physical Exam Updated Vital Signs BP (!) 177/92  Pulse 90    Temp 97.8 F (36.6 C) (Oral)    Resp 17    SpO2 98%   Physical Exam Vitals signs and nursing note reviewed.  Constitutional:      Appearance: Normal appearance. She is well-developed.  HENT:     Head: Normocephalic.     Comments: Patient with a 3 cm hematoma to the right posterior scalp.  Midface is stable.  No intraoral trauma.    Nose: Nose normal.     Mouth/Throat:     Mouth: Mucous membranes are moist.  Eyes:     Extraocular Movements: Extraocular movements intact.     Pupils: Pupils are equal, round, and reactive to light.  Neck:     Comments: Cervical collar in place. Cardiovascular:     Rate and Rhythm: Normal rate and regular rhythm.     Heart sounds: No murmur. No friction rub. No gallop.   Pulmonary:     Effort: Pulmonary effort is normal. No respiratory distress.     Breath sounds: Normal breath sounds. No stridor. No wheezing, rhonchi or rales.  Chest:     Chest wall: No tenderness.  Abdominal:     General: Bowel sounds are normal. There is no distension.      Palpations: Abdomen is soft.     Tenderness: There is no abdominal tenderness. There is no right CVA tenderness, left CVA tenderness, guarding or rebound.  Musculoskeletal: Normal range of motion.        General: No swelling, tenderness, deformity or signs of injury.     Right lower leg: No edema.     Left lower leg: No edema.     Comments: Pelvis is stable.  No midline thoracic or lumbar tenderness.  Distal pulses intact.  Skin:    General: Skin is warm and dry.     Capillary Refill: Capillary refill takes less than 2 seconds.     Findings: No erythema or rash.  Neurological:     General: No focal deficit present.     Mental Status: She is alert and oriented to person, place, and time.     Comments: Very mild confusion.  5/5 motor in all extremities.  Sensation fully intact.  Psychiatric:        Behavior: Behavior normal.      ED Treatments / Results  Labs (all labs ordered are listed, but only abnormal results are displayed) Labs Reviewed - No data to display  EKG None  Radiology Ct Head Wo Contrast  Result Date: 12/27/2018 CLINICAL DATA:  Pt is coming from [facility] home for a mechanical fall. Pt fell and hit her head on a table. PT is on Plavix. Pt is aware of fall and can remember the event per EMS, even though she has a hx of dementia.Head trauma, minor, pt on anticoagulation; C-spine trauma, high clinical risk (NEXUS/CCR) EXAM: CT HEAD WITHOUT CONTRAST CT CERVICAL SPINE WITHOUT CONTRAST TECHNIQUE: Multidetector CT imaging of the head and cervical spine was performed following the standard protocol without intravenous contrast. Multiplanar CT image reconstructions of the cervical spine were also generated. COMPARISON:  CT 06/22/2018 FINDINGS: CT HEAD FINDINGS Brain: No acute intracranial hemorrhage. No focal mass lesion. No CT evidence of acute infarction. No midline shift or mass effect. No hydrocephalus. Basilar cisterns are patent. There are periventricular and  subcortical white matter hypodensities. Generalized cortical atrophy. Vascular: No hyperdense vessel or unexpected calcification. Skull: Normal. Negative for fracture or focal lesion. Sinuses/Orbits: Paranasal sinuses and mastoid  air cells are clear. Orbits are clear. Other: None. CT CERVICAL SPINE FINDINGS Alignment: Normal alignment of the cervical vertebral bodies. Skull base and vertebrae: Normal craniocervical junction. No loss of vertebral body height or disc height. Normal facet articulation. No evidence of fracture. Soft tissues and spinal canal: No prevertebral soft tissue swelling. No perispinal or epidural hematoma. Disc levels:  Unremarkable Upper chest: Clear Other: None IMPRESSION: 1. No acute intracranial findings. 2. Atrophy and white matter microvascular disease. 3. No cervical spine fracture.  Mild disc osteophytic disease. Electronically Signed   By: Suzy Bouchard M.D.   On: 12/27/2018 17:16   Ct Cervical Spine Wo Contrast  Result Date: 12/27/2018 CLINICAL DATA:  Pt is coming from [facility] home for a mechanical fall. Pt fell and hit her head on a table. PT is on Plavix. Pt is aware of fall and can remember the event per EMS, even though she has a hx of dementia.Head trauma, minor, pt on anticoagulation; C-spine trauma, high clinical risk (NEXUS/CCR) EXAM: CT HEAD WITHOUT CONTRAST CT CERVICAL SPINE WITHOUT CONTRAST TECHNIQUE: Multidetector CT imaging of the head and cervical spine was performed following the standard protocol without intravenous contrast. Multiplanar CT image reconstructions of the cervical spine were also generated. COMPARISON:  CT 06/22/2018 FINDINGS: CT HEAD FINDINGS Brain: No acute intracranial hemorrhage. No focal mass lesion. No CT evidence of acute infarction. No midline shift or mass effect. No hydrocephalus. Basilar cisterns are patent. There are periventricular and subcortical white matter hypodensities. Generalized cortical atrophy. Vascular: No  hyperdense vessel or unexpected calcification. Skull: Normal. Negative for fracture or focal lesion. Sinuses/Orbits: Paranasal sinuses and mastoid air cells are clear. Orbits are clear. Other: None. CT CERVICAL SPINE FINDINGS Alignment: Normal alignment of the cervical vertebral bodies. Skull base and vertebrae: Normal craniocervical junction. No loss of vertebral body height or disc height. Normal facet articulation. No evidence of fracture. Soft tissues and spinal canal: No prevertebral soft tissue swelling. No perispinal or epidural hematoma. Disc levels:  Unremarkable Upper chest: Clear Other: None IMPRESSION: 1. No acute intracranial findings. 2. Atrophy and white matter microvascular disease. 3. No cervical spine fracture.  Mild disc osteophytic disease. Electronically Signed   By: Suzy Bouchard M.D.   On: 12/27/2018 17:16    Procedures Procedures (including critical care time)  Medications Ordered in ED Medications - No data to display   Initial Impression / Assessment and Plan / ED Course  I have reviewed the triage vital signs and the nursing notes.  Pertinent labs & imaging results that were available during my care of the patient were reviewed by me and considered in my medical decision making (see chart for details).        No obvious injuries on CT head and CT cervical spine.  Will discharge back to nursing home  Final Clinical Impressions(s) / ED Diagnoses   Final diagnoses:  Contusion of scalp, initial encounter    ED Discharge Orders    None       Julianne Rice, MD 12/27/18 2232

## 2018-12-27 NOTE — ED Triage Notes (Signed)
Per EMS: Pt is coming from [facility] home for a mechanical fall. Pt fell and hit her head on a table. PT is on Plavix. Pt is aware of fall and can remember the event per EMS, even though she has a hx of dementia.

## 2018-12-28 DIAGNOSIS — K219 Gastro-esophageal reflux disease without esophagitis: Secondary | ICD-10-CM | POA: Diagnosis not present

## 2018-12-28 DIAGNOSIS — J988 Other specified respiratory disorders: Secondary | ICD-10-CM | POA: Diagnosis not present

## 2018-12-28 DIAGNOSIS — I5032 Chronic diastolic (congestive) heart failure: Secondary | ICD-10-CM | POA: Diagnosis not present

## 2018-12-28 DIAGNOSIS — R2689 Other abnormalities of gait and mobility: Secondary | ICD-10-CM | POA: Diagnosis not present

## 2018-12-28 DIAGNOSIS — R1312 Dysphagia, oropharyngeal phase: Secondary | ICD-10-CM | POA: Diagnosis not present

## 2018-12-28 DIAGNOSIS — I110 Hypertensive heart disease with heart failure: Secondary | ICD-10-CM | POA: Diagnosis not present

## 2018-12-28 DIAGNOSIS — W19XXXD Unspecified fall, subsequent encounter: Secondary | ICD-10-CM | POA: Diagnosis not present

## 2018-12-28 DIAGNOSIS — J84112 Idiopathic pulmonary fibrosis: Secondary | ICD-10-CM | POA: Diagnosis not present

## 2018-12-28 DIAGNOSIS — F329 Major depressive disorder, single episode, unspecified: Secondary | ICD-10-CM | POA: Diagnosis not present

## 2018-12-28 DIAGNOSIS — J9601 Acute respiratory failure with hypoxia: Secondary | ICD-10-CM | POA: Diagnosis not present

## 2018-12-28 DIAGNOSIS — I6389 Other cerebral infarction: Secondary | ICD-10-CM | POA: Diagnosis not present

## 2018-12-28 DIAGNOSIS — R41841 Cognitive communication deficit: Secondary | ICD-10-CM | POA: Diagnosis not present

## 2018-12-28 DIAGNOSIS — F0150 Vascular dementia without behavioral disturbance: Secondary | ICD-10-CM | POA: Diagnosis not present

## 2018-12-28 DIAGNOSIS — R278 Other lack of coordination: Secondary | ICD-10-CM | POA: Diagnosis not present

## 2018-12-28 DIAGNOSIS — I69391 Dysphagia following cerebral infarction: Secondary | ICD-10-CM | POA: Diagnosis not present

## 2018-12-28 DIAGNOSIS — E785 Hyperlipidemia, unspecified: Secondary | ICD-10-CM | POA: Diagnosis not present

## 2018-12-29 DIAGNOSIS — E785 Hyperlipidemia, unspecified: Secondary | ICD-10-CM | POA: Diagnosis not present

## 2018-12-29 DIAGNOSIS — J9601 Acute respiratory failure with hypoxia: Secondary | ICD-10-CM | POA: Diagnosis not present

## 2018-12-29 DIAGNOSIS — I69391 Dysphagia following cerebral infarction: Secondary | ICD-10-CM | POA: Diagnosis not present

## 2018-12-29 DIAGNOSIS — R1312 Dysphagia, oropharyngeal phase: Secondary | ICD-10-CM | POA: Diagnosis not present

## 2018-12-29 DIAGNOSIS — I110 Hypertensive heart disease with heart failure: Secondary | ICD-10-CM | POA: Diagnosis not present

## 2018-12-29 DIAGNOSIS — K219 Gastro-esophageal reflux disease without esophagitis: Secondary | ICD-10-CM | POA: Diagnosis not present

## 2018-12-29 DIAGNOSIS — R278 Other lack of coordination: Secondary | ICD-10-CM | POA: Diagnosis not present

## 2018-12-29 DIAGNOSIS — R41841 Cognitive communication deficit: Secondary | ICD-10-CM | POA: Diagnosis not present

## 2018-12-29 DIAGNOSIS — J84112 Idiopathic pulmonary fibrosis: Secondary | ICD-10-CM | POA: Diagnosis not present

## 2018-12-29 DIAGNOSIS — F0150 Vascular dementia without behavioral disturbance: Secondary | ICD-10-CM | POA: Diagnosis not present

## 2018-12-29 DIAGNOSIS — J988 Other specified respiratory disorders: Secondary | ICD-10-CM | POA: Diagnosis not present

## 2018-12-29 DIAGNOSIS — F329 Major depressive disorder, single episode, unspecified: Secondary | ICD-10-CM | POA: Diagnosis not present

## 2018-12-29 DIAGNOSIS — I5032 Chronic diastolic (congestive) heart failure: Secondary | ICD-10-CM | POA: Diagnosis not present

## 2018-12-29 DIAGNOSIS — R2689 Other abnormalities of gait and mobility: Secondary | ICD-10-CM | POA: Diagnosis not present

## 2018-12-30 DIAGNOSIS — R1312 Dysphagia, oropharyngeal phase: Secondary | ICD-10-CM | POA: Diagnosis not present

## 2018-12-30 DIAGNOSIS — J988 Other specified respiratory disorders: Secondary | ICD-10-CM | POA: Diagnosis not present

## 2018-12-30 DIAGNOSIS — E785 Hyperlipidemia, unspecified: Secondary | ICD-10-CM | POA: Diagnosis not present

## 2018-12-30 DIAGNOSIS — F0150 Vascular dementia without behavioral disturbance: Secondary | ICD-10-CM | POA: Diagnosis not present

## 2018-12-30 DIAGNOSIS — F329 Major depressive disorder, single episode, unspecified: Secondary | ICD-10-CM | POA: Diagnosis not present

## 2018-12-30 DIAGNOSIS — K219 Gastro-esophageal reflux disease without esophagitis: Secondary | ICD-10-CM | POA: Diagnosis not present

## 2018-12-30 DIAGNOSIS — R278 Other lack of coordination: Secondary | ICD-10-CM | POA: Diagnosis not present

## 2018-12-30 DIAGNOSIS — R2689 Other abnormalities of gait and mobility: Secondary | ICD-10-CM | POA: Diagnosis not present

## 2018-12-30 DIAGNOSIS — I69391 Dysphagia following cerebral infarction: Secondary | ICD-10-CM | POA: Diagnosis not present

## 2018-12-30 DIAGNOSIS — J84112 Idiopathic pulmonary fibrosis: Secondary | ICD-10-CM | POA: Diagnosis not present

## 2018-12-30 DIAGNOSIS — I110 Hypertensive heart disease with heart failure: Secondary | ICD-10-CM | POA: Diagnosis not present

## 2018-12-30 DIAGNOSIS — I5032 Chronic diastolic (congestive) heart failure: Secondary | ICD-10-CM | POA: Diagnosis not present

## 2018-12-30 DIAGNOSIS — J9601 Acute respiratory failure with hypoxia: Secondary | ICD-10-CM | POA: Diagnosis not present

## 2018-12-30 DIAGNOSIS — R41841 Cognitive communication deficit: Secondary | ICD-10-CM | POA: Diagnosis not present

## 2018-12-31 DIAGNOSIS — I5032 Chronic diastolic (congestive) heart failure: Secondary | ICD-10-CM | POA: Diagnosis not present

## 2018-12-31 DIAGNOSIS — I69391 Dysphagia following cerebral infarction: Secondary | ICD-10-CM | POA: Diagnosis not present

## 2018-12-31 DIAGNOSIS — R278 Other lack of coordination: Secondary | ICD-10-CM | POA: Diagnosis not present

## 2018-12-31 DIAGNOSIS — J84112 Idiopathic pulmonary fibrosis: Secondary | ICD-10-CM | POA: Diagnosis not present

## 2018-12-31 DIAGNOSIS — R1312 Dysphagia, oropharyngeal phase: Secondary | ICD-10-CM | POA: Diagnosis not present

## 2018-12-31 DIAGNOSIS — K219 Gastro-esophageal reflux disease without esophagitis: Secondary | ICD-10-CM | POA: Diagnosis not present

## 2018-12-31 DIAGNOSIS — E785 Hyperlipidemia, unspecified: Secondary | ICD-10-CM | POA: Diagnosis not present

## 2018-12-31 DIAGNOSIS — F329 Major depressive disorder, single episode, unspecified: Secondary | ICD-10-CM | POA: Diagnosis not present

## 2018-12-31 DIAGNOSIS — R2689 Other abnormalities of gait and mobility: Secondary | ICD-10-CM | POA: Diagnosis not present

## 2018-12-31 DIAGNOSIS — J988 Other specified respiratory disorders: Secondary | ICD-10-CM | POA: Diagnosis not present

## 2018-12-31 DIAGNOSIS — F0150 Vascular dementia without behavioral disturbance: Secondary | ICD-10-CM | POA: Diagnosis not present

## 2018-12-31 DIAGNOSIS — J9601 Acute respiratory failure with hypoxia: Secondary | ICD-10-CM | POA: Diagnosis not present

## 2018-12-31 DIAGNOSIS — I110 Hypertensive heart disease with heart failure: Secondary | ICD-10-CM | POA: Diagnosis not present

## 2018-12-31 DIAGNOSIS — R41841 Cognitive communication deficit: Secondary | ICD-10-CM | POA: Diagnosis not present

## 2019-01-01 DIAGNOSIS — I6389 Other cerebral infarction: Secondary | ICD-10-CM | POA: Diagnosis not present

## 2019-01-01 DIAGNOSIS — R41841 Cognitive communication deficit: Secondary | ICD-10-CM | POA: Diagnosis not present

## 2019-01-01 DIAGNOSIS — R2689 Other abnormalities of gait and mobility: Secondary | ICD-10-CM | POA: Diagnosis not present

## 2019-01-01 DIAGNOSIS — I69391 Dysphagia following cerebral infarction: Secondary | ICD-10-CM | POA: Diagnosis not present

## 2019-01-01 DIAGNOSIS — J84112 Idiopathic pulmonary fibrosis: Secondary | ICD-10-CM | POA: Diagnosis not present

## 2019-01-01 DIAGNOSIS — I110 Hypertensive heart disease with heart failure: Secondary | ICD-10-CM | POA: Diagnosis not present

## 2019-01-01 DIAGNOSIS — F329 Major depressive disorder, single episode, unspecified: Secondary | ICD-10-CM | POA: Diagnosis not present

## 2019-01-01 DIAGNOSIS — J988 Other specified respiratory disorders: Secondary | ICD-10-CM | POA: Diagnosis not present

## 2019-01-01 DIAGNOSIS — F0150 Vascular dementia without behavioral disturbance: Secondary | ICD-10-CM | POA: Diagnosis not present

## 2019-01-01 DIAGNOSIS — E785 Hyperlipidemia, unspecified: Secondary | ICD-10-CM | POA: Diagnosis not present

## 2019-01-01 DIAGNOSIS — J9601 Acute respiratory failure with hypoxia: Secondary | ICD-10-CM | POA: Diagnosis not present

## 2019-01-01 DIAGNOSIS — W19XXXD Unspecified fall, subsequent encounter: Secondary | ICD-10-CM | POA: Diagnosis not present

## 2019-01-01 DIAGNOSIS — K219 Gastro-esophageal reflux disease without esophagitis: Secondary | ICD-10-CM | POA: Diagnosis not present

## 2019-01-01 DIAGNOSIS — R278 Other lack of coordination: Secondary | ICD-10-CM | POA: Diagnosis not present

## 2019-01-01 DIAGNOSIS — R1312 Dysphagia, oropharyngeal phase: Secondary | ICD-10-CM | POA: Diagnosis not present

## 2019-01-01 DIAGNOSIS — I5032 Chronic diastolic (congestive) heart failure: Secondary | ICD-10-CM | POA: Diagnosis not present

## 2019-01-04 DIAGNOSIS — I5032 Chronic diastolic (congestive) heart failure: Secondary | ICD-10-CM | POA: Diagnosis not present

## 2019-01-04 DIAGNOSIS — R2689 Other abnormalities of gait and mobility: Secondary | ICD-10-CM | POA: Diagnosis not present

## 2019-01-04 DIAGNOSIS — F329 Major depressive disorder, single episode, unspecified: Secondary | ICD-10-CM | POA: Diagnosis not present

## 2019-01-04 DIAGNOSIS — J9601 Acute respiratory failure with hypoxia: Secondary | ICD-10-CM | POA: Diagnosis not present

## 2019-01-04 DIAGNOSIS — J988 Other specified respiratory disorders: Secondary | ICD-10-CM | POA: Diagnosis not present

## 2019-01-04 DIAGNOSIS — K219 Gastro-esophageal reflux disease without esophagitis: Secondary | ICD-10-CM | POA: Diagnosis not present

## 2019-01-04 DIAGNOSIS — E785 Hyperlipidemia, unspecified: Secondary | ICD-10-CM | POA: Diagnosis not present

## 2019-01-04 DIAGNOSIS — F0150 Vascular dementia without behavioral disturbance: Secondary | ICD-10-CM | POA: Diagnosis not present

## 2019-01-04 DIAGNOSIS — I110 Hypertensive heart disease with heart failure: Secondary | ICD-10-CM | POA: Diagnosis not present

## 2019-01-04 DIAGNOSIS — R278 Other lack of coordination: Secondary | ICD-10-CM | POA: Diagnosis not present

## 2019-01-04 DIAGNOSIS — R41841 Cognitive communication deficit: Secondary | ICD-10-CM | POA: Diagnosis not present

## 2019-01-04 DIAGNOSIS — J84112 Idiopathic pulmonary fibrosis: Secondary | ICD-10-CM | POA: Diagnosis not present

## 2019-01-04 DIAGNOSIS — I69391 Dysphagia following cerebral infarction: Secondary | ICD-10-CM | POA: Diagnosis not present

## 2019-01-04 DIAGNOSIS — R1312 Dysphagia, oropharyngeal phase: Secondary | ICD-10-CM | POA: Diagnosis not present

## 2019-01-05 DIAGNOSIS — R278 Other lack of coordination: Secondary | ICD-10-CM | POA: Diagnosis not present

## 2019-01-05 DIAGNOSIS — R41841 Cognitive communication deficit: Secondary | ICD-10-CM | POA: Diagnosis not present

## 2019-01-05 DIAGNOSIS — J9601 Acute respiratory failure with hypoxia: Secondary | ICD-10-CM | POA: Diagnosis not present

## 2019-01-05 DIAGNOSIS — I110 Hypertensive heart disease with heart failure: Secondary | ICD-10-CM | POA: Diagnosis not present

## 2019-01-05 DIAGNOSIS — J84112 Idiopathic pulmonary fibrosis: Secondary | ICD-10-CM | POA: Diagnosis not present

## 2019-01-05 DIAGNOSIS — I5032 Chronic diastolic (congestive) heart failure: Secondary | ICD-10-CM | POA: Diagnosis not present

## 2019-01-05 DIAGNOSIS — F0150 Vascular dementia without behavioral disturbance: Secondary | ICD-10-CM | POA: Diagnosis not present

## 2019-01-05 DIAGNOSIS — K219 Gastro-esophageal reflux disease without esophagitis: Secondary | ICD-10-CM | POA: Diagnosis not present

## 2019-01-05 DIAGNOSIS — J988 Other specified respiratory disorders: Secondary | ICD-10-CM | POA: Diagnosis not present

## 2019-01-05 DIAGNOSIS — I69391 Dysphagia following cerebral infarction: Secondary | ICD-10-CM | POA: Diagnosis not present

## 2019-01-05 DIAGNOSIS — F329 Major depressive disorder, single episode, unspecified: Secondary | ICD-10-CM | POA: Diagnosis not present

## 2019-01-05 DIAGNOSIS — E785 Hyperlipidemia, unspecified: Secondary | ICD-10-CM | POA: Diagnosis not present

## 2019-01-05 DIAGNOSIS — R1312 Dysphagia, oropharyngeal phase: Secondary | ICD-10-CM | POA: Diagnosis not present

## 2019-01-05 DIAGNOSIS — R2689 Other abnormalities of gait and mobility: Secondary | ICD-10-CM | POA: Diagnosis not present

## 2019-01-06 DIAGNOSIS — I5032 Chronic diastolic (congestive) heart failure: Secondary | ICD-10-CM | POA: Diagnosis not present

## 2019-01-06 DIAGNOSIS — J988 Other specified respiratory disorders: Secondary | ICD-10-CM | POA: Diagnosis not present

## 2019-01-06 DIAGNOSIS — F0150 Vascular dementia without behavioral disturbance: Secondary | ICD-10-CM | POA: Diagnosis not present

## 2019-01-06 DIAGNOSIS — F329 Major depressive disorder, single episode, unspecified: Secondary | ICD-10-CM | POA: Diagnosis not present

## 2019-01-06 DIAGNOSIS — J84112 Idiopathic pulmonary fibrosis: Secondary | ICD-10-CM | POA: Diagnosis not present

## 2019-01-06 DIAGNOSIS — R278 Other lack of coordination: Secondary | ICD-10-CM | POA: Diagnosis not present

## 2019-01-06 DIAGNOSIS — E785 Hyperlipidemia, unspecified: Secondary | ICD-10-CM | POA: Diagnosis not present

## 2019-01-06 DIAGNOSIS — I69391 Dysphagia following cerebral infarction: Secondary | ICD-10-CM | POA: Diagnosis not present

## 2019-01-06 DIAGNOSIS — I110 Hypertensive heart disease with heart failure: Secondary | ICD-10-CM | POA: Diagnosis not present

## 2019-01-06 DIAGNOSIS — J9601 Acute respiratory failure with hypoxia: Secondary | ICD-10-CM | POA: Diagnosis not present

## 2019-01-06 DIAGNOSIS — R41841 Cognitive communication deficit: Secondary | ICD-10-CM | POA: Diagnosis not present

## 2019-01-06 DIAGNOSIS — K219 Gastro-esophageal reflux disease without esophagitis: Secondary | ICD-10-CM | POA: Diagnosis not present

## 2019-01-06 DIAGNOSIS — R2689 Other abnormalities of gait and mobility: Secondary | ICD-10-CM | POA: Diagnosis not present

## 2019-01-06 DIAGNOSIS — R1312 Dysphagia, oropharyngeal phase: Secondary | ICD-10-CM | POA: Diagnosis not present

## 2019-01-07 DIAGNOSIS — R1312 Dysphagia, oropharyngeal phase: Secondary | ICD-10-CM | POA: Diagnosis not present

## 2019-01-07 DIAGNOSIS — E785 Hyperlipidemia, unspecified: Secondary | ICD-10-CM | POA: Diagnosis not present

## 2019-01-07 DIAGNOSIS — J988 Other specified respiratory disorders: Secondary | ICD-10-CM | POA: Diagnosis not present

## 2019-01-07 DIAGNOSIS — I69391 Dysphagia following cerebral infarction: Secondary | ICD-10-CM | POA: Diagnosis not present

## 2019-01-07 DIAGNOSIS — R278 Other lack of coordination: Secondary | ICD-10-CM | POA: Diagnosis not present

## 2019-01-07 DIAGNOSIS — F329 Major depressive disorder, single episode, unspecified: Secondary | ICD-10-CM | POA: Diagnosis not present

## 2019-01-07 DIAGNOSIS — I5032 Chronic diastolic (congestive) heart failure: Secondary | ICD-10-CM | POA: Diagnosis not present

## 2019-01-07 DIAGNOSIS — F0150 Vascular dementia without behavioral disturbance: Secondary | ICD-10-CM | POA: Diagnosis not present

## 2019-01-07 DIAGNOSIS — K219 Gastro-esophageal reflux disease without esophagitis: Secondary | ICD-10-CM | POA: Diagnosis not present

## 2019-01-07 DIAGNOSIS — J9601 Acute respiratory failure with hypoxia: Secondary | ICD-10-CM | POA: Diagnosis not present

## 2019-01-07 DIAGNOSIS — I110 Hypertensive heart disease with heart failure: Secondary | ICD-10-CM | POA: Diagnosis not present

## 2019-01-07 DIAGNOSIS — J84112 Idiopathic pulmonary fibrosis: Secondary | ICD-10-CM | POA: Diagnosis not present

## 2019-01-07 DIAGNOSIS — R2689 Other abnormalities of gait and mobility: Secondary | ICD-10-CM | POA: Diagnosis not present

## 2019-01-07 DIAGNOSIS — R41841 Cognitive communication deficit: Secondary | ICD-10-CM | POA: Diagnosis not present

## 2019-01-08 DIAGNOSIS — K219 Gastro-esophageal reflux disease without esophagitis: Secondary | ICD-10-CM | POA: Diagnosis not present

## 2019-01-08 DIAGNOSIS — R278 Other lack of coordination: Secondary | ICD-10-CM | POA: Diagnosis not present

## 2019-01-08 DIAGNOSIS — F0150 Vascular dementia without behavioral disturbance: Secondary | ICD-10-CM | POA: Diagnosis not present

## 2019-01-08 DIAGNOSIS — F329 Major depressive disorder, single episode, unspecified: Secondary | ICD-10-CM | POA: Diagnosis not present

## 2019-01-08 DIAGNOSIS — I110 Hypertensive heart disease with heart failure: Secondary | ICD-10-CM | POA: Diagnosis not present

## 2019-01-08 DIAGNOSIS — I69391 Dysphagia following cerebral infarction: Secondary | ICD-10-CM | POA: Diagnosis not present

## 2019-01-08 DIAGNOSIS — J988 Other specified respiratory disorders: Secondary | ICD-10-CM | POA: Diagnosis not present

## 2019-01-08 DIAGNOSIS — J9601 Acute respiratory failure with hypoxia: Secondary | ICD-10-CM | POA: Diagnosis not present

## 2019-01-08 DIAGNOSIS — I5032 Chronic diastolic (congestive) heart failure: Secondary | ICD-10-CM | POA: Diagnosis not present

## 2019-01-08 DIAGNOSIS — I6389 Other cerebral infarction: Secondary | ICD-10-CM | POA: Diagnosis not present

## 2019-01-08 DIAGNOSIS — R41841 Cognitive communication deficit: Secondary | ICD-10-CM | POA: Diagnosis not present

## 2019-01-08 DIAGNOSIS — R1312 Dysphagia, oropharyngeal phase: Secondary | ICD-10-CM | POA: Diagnosis not present

## 2019-01-08 DIAGNOSIS — J84112 Idiopathic pulmonary fibrosis: Secondary | ICD-10-CM | POA: Diagnosis not present

## 2019-01-08 DIAGNOSIS — W19XXXD Unspecified fall, subsequent encounter: Secondary | ICD-10-CM | POA: Diagnosis not present

## 2019-01-08 DIAGNOSIS — E785 Hyperlipidemia, unspecified: Secondary | ICD-10-CM | POA: Diagnosis not present

## 2019-01-08 DIAGNOSIS — R2689 Other abnormalities of gait and mobility: Secondary | ICD-10-CM | POA: Diagnosis not present

## 2019-01-09 DIAGNOSIS — I10 Essential (primary) hypertension: Secondary | ICD-10-CM | POA: Diagnosis not present

## 2019-01-09 DIAGNOSIS — F329 Major depressive disorder, single episode, unspecified: Secondary | ICD-10-CM | POA: Diagnosis not present

## 2019-01-09 DIAGNOSIS — I5032 Chronic diastolic (congestive) heart failure: Secondary | ICD-10-CM | POA: Diagnosis not present

## 2019-01-09 DIAGNOSIS — F0150 Vascular dementia without behavioral disturbance: Secondary | ICD-10-CM | POA: Diagnosis not present

## 2019-01-10 DIAGNOSIS — R278 Other lack of coordination: Secondary | ICD-10-CM | POA: Diagnosis not present

## 2019-01-10 DIAGNOSIS — J84112 Idiopathic pulmonary fibrosis: Secondary | ICD-10-CM | POA: Diagnosis not present

## 2019-01-10 DIAGNOSIS — F0150 Vascular dementia without behavioral disturbance: Secondary | ICD-10-CM | POA: Diagnosis not present

## 2019-01-10 DIAGNOSIS — J988 Other specified respiratory disorders: Secondary | ICD-10-CM | POA: Diagnosis not present

## 2019-01-10 DIAGNOSIS — E785 Hyperlipidemia, unspecified: Secondary | ICD-10-CM | POA: Diagnosis not present

## 2019-01-10 DIAGNOSIS — I5032 Chronic diastolic (congestive) heart failure: Secondary | ICD-10-CM | POA: Diagnosis not present

## 2019-01-10 DIAGNOSIS — R1312 Dysphagia, oropharyngeal phase: Secondary | ICD-10-CM | POA: Diagnosis not present

## 2019-01-10 DIAGNOSIS — F329 Major depressive disorder, single episode, unspecified: Secondary | ICD-10-CM | POA: Diagnosis not present

## 2019-01-10 DIAGNOSIS — R2689 Other abnormalities of gait and mobility: Secondary | ICD-10-CM | POA: Diagnosis not present

## 2019-01-10 DIAGNOSIS — J9601 Acute respiratory failure with hypoxia: Secondary | ICD-10-CM | POA: Diagnosis not present

## 2019-01-10 DIAGNOSIS — I110 Hypertensive heart disease with heart failure: Secondary | ICD-10-CM | POA: Diagnosis not present

## 2019-01-10 DIAGNOSIS — I69391 Dysphagia following cerebral infarction: Secondary | ICD-10-CM | POA: Diagnosis not present

## 2019-01-10 DIAGNOSIS — R41841 Cognitive communication deficit: Secondary | ICD-10-CM | POA: Diagnosis not present

## 2019-01-10 DIAGNOSIS — K219 Gastro-esophageal reflux disease without esophagitis: Secondary | ICD-10-CM | POA: Diagnosis not present

## 2019-01-11 DIAGNOSIS — J84112 Idiopathic pulmonary fibrosis: Secondary | ICD-10-CM | POA: Diagnosis not present

## 2019-01-11 DIAGNOSIS — I6389 Other cerebral infarction: Secondary | ICD-10-CM | POA: Diagnosis not present

## 2019-01-11 DIAGNOSIS — W19XXXD Unspecified fall, subsequent encounter: Secondary | ICD-10-CM | POA: Diagnosis not present

## 2019-01-11 DIAGNOSIS — I5032 Chronic diastolic (congestive) heart failure: Secondary | ICD-10-CM | POA: Diagnosis not present

## 2019-01-12 DIAGNOSIS — N390 Urinary tract infection, site not specified: Secondary | ICD-10-CM | POA: Diagnosis not present

## 2019-01-12 DIAGNOSIS — E039 Hypothyroidism, unspecified: Secondary | ICD-10-CM | POA: Diagnosis not present

## 2019-01-12 DIAGNOSIS — E559 Vitamin D deficiency, unspecified: Secondary | ICD-10-CM | POA: Diagnosis not present

## 2019-01-12 DIAGNOSIS — Z79899 Other long term (current) drug therapy: Secondary | ICD-10-CM | POA: Diagnosis not present

## 2019-01-13 DIAGNOSIS — I6389 Other cerebral infarction: Secondary | ICD-10-CM | POA: Diagnosis not present

## 2019-01-13 DIAGNOSIS — I5032 Chronic diastolic (congestive) heart failure: Secondary | ICD-10-CM | POA: Diagnosis not present

## 2019-01-13 DIAGNOSIS — W19XXXD Unspecified fall, subsequent encounter: Secondary | ICD-10-CM | POA: Diagnosis not present

## 2019-01-13 DIAGNOSIS — J84112 Idiopathic pulmonary fibrosis: Secondary | ICD-10-CM | POA: Diagnosis not present

## 2019-01-14 DIAGNOSIS — I6389 Other cerebral infarction: Secondary | ICD-10-CM | POA: Diagnosis not present

## 2019-01-14 DIAGNOSIS — N390 Urinary tract infection, site not specified: Secondary | ICD-10-CM | POA: Diagnosis not present

## 2019-01-14 DIAGNOSIS — W19XXXD Unspecified fall, subsequent encounter: Secondary | ICD-10-CM | POA: Diagnosis not present

## 2019-01-14 DIAGNOSIS — J84112 Idiopathic pulmonary fibrosis: Secondary | ICD-10-CM | POA: Diagnosis not present

## 2019-01-15 DIAGNOSIS — I10 Essential (primary) hypertension: Secondary | ICD-10-CM | POA: Diagnosis not present

## 2019-01-15 DIAGNOSIS — F322 Major depressive disorder, single episode, severe without psychotic features: Secondary | ICD-10-CM | POA: Diagnosis not present

## 2019-01-15 DIAGNOSIS — F0151 Vascular dementia with behavioral disturbance: Secondary | ICD-10-CM | POA: Diagnosis not present

## 2019-01-15 DIAGNOSIS — I5030 Unspecified diastolic (congestive) heart failure: Secondary | ICD-10-CM | POA: Diagnosis not present

## 2019-02-02 DIAGNOSIS — I10 Essential (primary) hypertension: Secondary | ICD-10-CM | POA: Diagnosis not present

## 2019-02-02 DIAGNOSIS — E785 Hyperlipidemia, unspecified: Secondary | ICD-10-CM | POA: Diagnosis not present

## 2019-02-02 DIAGNOSIS — E569 Vitamin deficiency, unspecified: Secondary | ICD-10-CM | POA: Diagnosis not present

## 2019-02-02 DIAGNOSIS — Z79899 Other long term (current) drug therapy: Secondary | ICD-10-CM | POA: Diagnosis not present

## 2019-02-12 DIAGNOSIS — I5030 Unspecified diastolic (congestive) heart failure: Secondary | ICD-10-CM | POA: Diagnosis not present

## 2019-02-12 DIAGNOSIS — F0151 Vascular dementia with behavioral disturbance: Secondary | ICD-10-CM | POA: Diagnosis not present

## 2019-02-12 DIAGNOSIS — F322 Major depressive disorder, single episode, severe without psychotic features: Secondary | ICD-10-CM | POA: Diagnosis not present

## 2019-02-12 DIAGNOSIS — I10 Essential (primary) hypertension: Secondary | ICD-10-CM | POA: Diagnosis not present

## 2019-02-19 DIAGNOSIS — I6389 Other cerebral infarction: Secondary | ICD-10-CM | POA: Diagnosis not present

## 2019-02-19 DIAGNOSIS — I5032 Chronic diastolic (congestive) heart failure: Secondary | ICD-10-CM | POA: Diagnosis not present

## 2019-02-19 DIAGNOSIS — I10 Essential (primary) hypertension: Secondary | ICD-10-CM | POA: Diagnosis not present

## 2019-02-19 DIAGNOSIS — J84112 Idiopathic pulmonary fibrosis: Secondary | ICD-10-CM | POA: Diagnosis not present

## 2019-02-25 DIAGNOSIS — Z20828 Contact with and (suspected) exposure to other viral communicable diseases: Secondary | ICD-10-CM | POA: Diagnosis not present

## 2019-02-25 DIAGNOSIS — I5032 Chronic diastolic (congestive) heart failure: Secondary | ICD-10-CM | POA: Diagnosis not present

## 2019-02-25 DIAGNOSIS — J84112 Idiopathic pulmonary fibrosis: Secondary | ICD-10-CM | POA: Diagnosis not present

## 2019-02-25 DIAGNOSIS — I6389 Other cerebral infarction: Secondary | ICD-10-CM | POA: Diagnosis not present

## 2019-02-25 DIAGNOSIS — F0150 Vascular dementia without behavioral disturbance: Secondary | ICD-10-CM | POA: Diagnosis not present

## 2019-03-03 DIAGNOSIS — Z20828 Contact with and (suspected) exposure to other viral communicable diseases: Secondary | ICD-10-CM | POA: Diagnosis not present

## 2019-03-16 DIAGNOSIS — Z20828 Contact with and (suspected) exposure to other viral communicable diseases: Secondary | ICD-10-CM | POA: Diagnosis not present

## 2019-03-19 ENCOUNTER — Non-Acute Institutional Stay: Payer: Medicare Other | Admitting: Internal Medicine

## 2019-03-19 ENCOUNTER — Other Ambulatory Visit: Payer: Self-pay

## 2019-03-19 VITALS — Ht 62.0 in | Wt 92.0 lb

## 2019-03-19 DIAGNOSIS — Z515 Encounter for palliative care: Secondary | ICD-10-CM

## 2019-03-19 DIAGNOSIS — F322 Major depressive disorder, single episode, severe without psychotic features: Secondary | ICD-10-CM | POA: Diagnosis not present

## 2019-03-19 DIAGNOSIS — F0151 Vascular dementia with behavioral disturbance: Secondary | ICD-10-CM | POA: Diagnosis not present

## 2019-03-19 DIAGNOSIS — I5030 Unspecified diastolic (congestive) heart failure: Secondary | ICD-10-CM | POA: Diagnosis not present

## 2019-03-19 DIAGNOSIS — I10 Essential (primary) hypertension: Secondary | ICD-10-CM | POA: Diagnosis not present

## 2019-03-20 ENCOUNTER — Non-Acute Institutional Stay: Payer: Medicare Other | Admitting: Internal Medicine

## 2019-03-20 ENCOUNTER — Encounter: Payer: Self-pay | Admitting: Internal Medicine

## 2019-03-20 DIAGNOSIS — Z515 Encounter for palliative care: Secondary | ICD-10-CM | POA: Diagnosis not present

## 2019-03-20 NOTE — Progress Notes (Signed)
Oct 24rd, 2020 [facility] Jun 26, 2018 Community Palliative Care Telephone: [phone] Fax: [phone]  Due to the current COVID-19 infection/crises, the patient and family prefer, and have given their verbal consent for, a provider visit via telehealth, from my office. HIPPA policies of confidentially were discussed.  PATIENT NAME:Carmen Duncan DOB:23-Dec-1940 [id number] [name] [id number]  PRIMARY CARE PROVIDER:Burchette, Alinda Sierras, MD [address] [place], [place] [address]  REFERRING PROVIDER: Dr, Wenda Low Dr. Alva(Pulmonary)  RESPONSIBLE PARTY:(son) [facility] [phone]. (D-I-L) Lonzo Cloud (previously worked in Insurance underwriter administration)[phone], [phone]  ASSESSMENT / RECOMMENDATIONS: 1.Advanced Care Plans:  A. Goals of care discussion: TC to patient's son Carmen Duncan in f/u to [facility] visit yesterday. Discussed patient's marked weight loss over the last 10 months, as well as her continued cognitive and functional decline. We discussed that patient may meet hospice eligibilitycriteria. Carmen Duncan has familiarity with Hospice, as he works as a Software engineer for another [employer]ist. We talked about the restrictions currently placed upon our hospice team, due to [place] crises. Carmen Duncan wishes to proceed with referral to hospice, anticipating that the facility will be loosening up restrictions on hospice visitations in the near future.   -Will f/u with facility SW and patient's PCP for referral, if they are in agreement.      B. Directives: DNR/MOST forms on the chart.  2. Follow up: pending consideration for hospice enrollment.   I spent53minutes providing this consultation, from1:30pmto2pm. More than 50% of thattime was spent coordinating communication.  HISTORY OF PRESENT ILLNESS:Carmen T Mooreis a 78 y.o.femalewith medical h/oPNA, Vascular Dementia(mod to advanced) without behavioral disturbance,  CVA(Plavix),HTN,dCHF,pulmonary fibrosis, acute respiratory failure with hypoxia, andFTT. Hospitalization 1/27-1/29/2020 for treatment of mild URI vs atypical pneummonia. She was discharged to [facility] for rehab, and then transitioned to long term care.  12/27/2018: ER visit for unwitnessed fall; fell backwards and struck head on night table. Hematoma R posterior scalp. On Plavix. Head and cervical CT without obvious injuries.  This is a f/uPalliative Carevisit from 03/19/2019.   CODE STATUS:DNR, MOST: DNR/DNI, Limited scope of medical interventions. IVFs and antbx if indicated.  PPS:30%  HOSPICE ELIGIBILITY/DIAGNOSIS:TBD  PAST MEDICAL HISTORY:      Past Medical History:  Diagnosis Date  . Acoustic neuroma ([place])   . Anxiety   . Arthritis   . Asthma   . Brain cancer ([place])   . Breast cancer of upper-outer quadrant of right female breast ([place]) 07/19/2015  . Diabetes mellitus without complication ([place])   . Diverticulosis   . Ejection fraction   . Essential tremor    on inderal  . GERD (gastroesophageal reflux disease)   . HOH (hard of hearing) left ear  . Hyperlipidemia   . Hypertension   . IBS (irritable bowel syndrome)   . Interstitial lung disease ([place])   . Pneumonia    04-2014, CAP  . Stroke [facility])    no deficits, on plavix    SOCIAL HX:  Social History        Tobacco Use  . Smoking status: Never Smoker  . Smokeless tobacco: Never Used  Substance Use Topics  . Alcohol use: Yes    Alcohol/week: 0.0 standard drinks    Comment: wine about 2 times a week     ALLERGIES:       Allergies  Allergen Reactions  . Sulfa Antibiotics Rash  . Latex Itching, Rash and Other (See Comments)    Red angry skin from latex tape  .  Sulfonamide Derivatives Hives and Rash  . Tape Itching, Rash and Other (See Comments)    Regular tape tears skin. Please use paper tape or coban wrap!!      PERTINENT MEDICATIONS:  Outpatient  Encounter Medications as of 03/19/2019  Medication Sig  . acetaminophen (TYLENOL) 325 MG tablet Take 650 mg by mouth every 6 (six) hours as needed for headache (pain).   [name] amLODipine (NORVASC) 2.5 MG tablet TAKE 1 TABLET BY MOUTH ONCE DAILY. (Patient taking differently: Take 2.5 mg by mouth daily. )  . atorvastatin (LIPITOR) 20 MG tablet TAKE 1 TABLET BY MOUTH ONCE DAILY (Patient taking differently: Take 20 mg by mouth daily. )  . clopidogrel (PLAVIX) 75 MG tablet Take 1 tablet (75 mg total) by mouth daily.  [name] escitalopram (LEXAPRO) 10 MG tablet Take 1 tablet (10 mg total) by mouth daily.  . Fish Oil-Cholecalciferol (OMEGA-3 FISH OIL/VITAMIN D3) 1000-1000 MG-UNIT CAPS Take 1,000 Units by mouth.  . ondansetron (ZOFRAN) 4 MG tablet Take 1 tablet (4 mg total) by mouth every 8 (eight) hours as needed for nausea or vomiting.  . pantoprazole (PROTONIX) 20 MG tablet Take 1 tablet (20 mg total) by mouth daily.  . propranolol (INDERAL) 20 MG tablet Take 1 tablet (20 mg total) by mouth daily.  . vitamin B-12 (CYANOCOBALAMIN) 1000 MCG tablet Take 1,000 mcg by mouth daily.   No facility-administered encounter medications on file as of 03/19/2019.     PHYSICAL EXAM:   PE deferred d/t telehealth nature of visit; goals of care discussion with son/HCPOA.  Julianne Handler, NP

## 2019-03-20 NOTE — Progress Notes (Signed)
Oct 23rd, 2020 [facility] Jun 26, 2018 [facility] Pall[facility] Telephone: [phone] Fax: [phone]   Due to the current COVID-19 infection/crises, the patient and family prefer, and have given their verbal consent for, a provider visit via telehealth, from my office. HIPPA policies of confidentially were discussed.  PATIENT NAME: Carmen Duncan DOB: [birth year] MRN: [id number]  Carmen Duncan [id number]   PRIMARY CARE PROVIDER:   [name], MD [address] [place], [place] [address]   REFERRING PROVIDER:  Dr, Wenda Low Dr. Elsworth Soho (Pulmonary)   RESPONSIBLE PARTY: (son) Jacqulyn Cane [phone]. (D-I-L) Lonzo Cloud (previously worked in Insurance underwriter [phone], [phone]   ASSESSMENT:    1.Functional and cognitive decline; weight loss: Continues conversant but confused; oriented to person only. She is dependent for transfers, hygiene, and dressing. She is wheelchair bound. She is incontinent of bowel and bladder. She was evaluated in the ER early Aug for a fall; suffered a posterior scalp hematoma. Patient can feed herself. Her weight 2 weeks earlier was 92 lbs, which is a loss of 10.2lbs (10% of body weight) over the last 7 months, and total of 34.2lbs (27% of her body weight) over the last 10 months. At 44 her BMI is 17.4kg/m2.    2. Advanced Care Directives: DNR/MOST forms on the chart.   3. Goals of Care: Comfort. I plan to speak with son [name] and D-I-L Butch Penny with visit updates. [name] is a Software engineer with a hospice agency. If patient trajectory continues with ongoing functional decline and weight loss, patient could be eligible for hospice services. From past conversations, family was open to that option.   4. Follow up: pending consideration for hospice enrollment. I need find out if facility is allowing hospice to visit, before I discuss this with the family so I can let them know what services they could  expect.    I spent 30 minutes providing this consultation, from 1:30pm to 2pm. More than 50% of that time was spent coordinating communication, interviewing staff and family, chart review, and reconciling facility MAR with [facility] EMR.    HISTORY OF PRESENT ILLNESS:  ROCQUEL Duncan is a 78 y.o. 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 Hospitalization 1/27-1/29/2020 for treatment of mild URI vs atypical pneummonia. She was discharged to [facility] for rehab, and then transitioned to long term care.   12/27/2018: ER visit for unwitnessed fall; fell backwards and struck head on night table. Hematoma R posterior scalp. On Plavix. Head and cervical CT without obvious injuries.  This is a f/u Palliative Care visit from 08/18/2018.    CODE STATUS: DNR, MOST: DNR/DNI, Limited scope of medical interventions. IVFs and antbx if indicated.    PPS: 30%  HOSPICE ELIGIBILITY/DIAGNOSIS: TBD  PAST MEDICAL HISTORY:  Past Medical History:  Diagnosis Date   Acoustic neuroma ([place])    Anxiety    Arthritis    Asthma    Brain cancer ([place])    Breast cancer of upper-outer quadrant of right female breast ([place]) 07/19/2015   Diabetes mellitus without complication (H[facility])    Diverticulosis    Ejection fraction    Essential tremor    on inderal   GERD (gastroesophageal reflux disease)    HOH (hard of hearing) left ear   Hyperlipidemia    Hypertension    IBS (irritable  bowel syndrome)    Interstitial lung disease ([place])    Pneumonia    04-2014, CAP   Stroke ([place])    no deficits, on plavix    SOCIAL HX:  Social History   Tobacco Use   Smoking status: Never Smoker   Smokeless tobacco: Never Used  Substance Use Topics   Alcohol use: Yes    Alcohol/week: 0.0 standard drinks    Comment: wine about 2 times a week     ALLERGIES:  Allergies   Allergen Reactions   Sulfa Antibiotics Rash   Latex Itching, Rash and Other (See Comments)    Red angry skin from latex tape   Sulfonamide Derivatives Hives and Rash   Tape Itching, Rash and Other (See Comments)    Regular tape tears skin. Please use paper tape or coban wrap!!      PERTINENT MEDICATIONS:  Outpatient Encounter Medications as of 03/19/2019  Medication Sig   acetaminophen (TYLENOL) 325 MG tablet Take 650 mg by mouth every 6 (six) hours as needed for headache (pain).    amLODipine (NORVASC) 2.5 MG tablet TAKE 1 TABLET BY MOUTH ONCE DAILY. (Patient taking differently: Take 2.5 mg by mouth daily. )   atorvastatin (LIPITOR) 20 MG tablet TAKE 1 TABLET BY MOUTH ONCE DAILY (Patient taking differently: Take 20 mg by mouth daily. )   clopidogrel (PLAVIX) 75 MG tablet Take 1 tablet (75 mg total) by mouth daily.   escitalopram (LEXAPRO) 10 MG tablet Take 1 tablet (10 mg total) by mouth daily.   Fish Oil-Cholecalciferol (OMEGA-3 FISH OIL/VITAMIN D3) 1000-1000 MG-UNIT CAPS Take 1,000 Units by mouth.   ondansetron (ZOFRAN) 4 MG tablet Take 1 tablet (4 mg total) by mouth every 8 (eight) hours as needed for nausea or vomiting.   pantoprazole (PROTONIX) 20 MG tablet Take 1 tablet (20 mg total) by mouth daily.   propranolol (INDERAL) 20 MG tablet Take 1 tablet (20 mg total) by mouth daily.   vitamin B-12 (CYANOCOBALAMIN) 1000 MCG tablet Take 1,000 mcg by mouth daily.   No facility-administered encounter medications on file as of 03/19/2019.     PHYSICAL EXAM:   PE deferred d/t telehealth (assisted by facility [facility]) nature of visit. Patient is a frail appearing elderly female sitting up in her recliner. She is alert and pleasantly conversant. Denies pain. Oriented to person only.  Julianne Handler, NP

## 2019-03-22 DIAGNOSIS — I6389 Other cerebral infarction: Secondary | ICD-10-CM | POA: Diagnosis not present

## 2019-03-22 DIAGNOSIS — I5032 Chronic diastolic (congestive) heart failure: Secondary | ICD-10-CM | POA: Diagnosis not present

## 2019-03-22 DIAGNOSIS — R21 Rash and other nonspecific skin eruption: Secondary | ICD-10-CM | POA: Diagnosis not present

## 2019-03-22 DIAGNOSIS — J84112 Idiopathic pulmonary fibrosis: Secondary | ICD-10-CM | POA: Diagnosis not present

## 2019-03-23 DIAGNOSIS — Z20828 Contact with and (suspected) exposure to other viral communicable diseases: Secondary | ICD-10-CM | POA: Diagnosis not present

## 2019-03-29 DIAGNOSIS — R627 Adult failure to thrive: Secondary | ICD-10-CM | POA: Diagnosis not present

## 2019-03-29 DIAGNOSIS — J84112 Idiopathic pulmonary fibrosis: Secondary | ICD-10-CM | POA: Diagnosis not present

## 2019-03-29 DIAGNOSIS — F0150 Vascular dementia without behavioral disturbance: Secondary | ICD-10-CM | POA: Diagnosis not present

## 2019-03-29 DIAGNOSIS — I6389 Other cerebral infarction: Secondary | ICD-10-CM | POA: Diagnosis not present

## 2019-03-30 DIAGNOSIS — Z20828 Contact with and (suspected) exposure to other viral communicable diseases: Secondary | ICD-10-CM | POA: Diagnosis not present

## 2019-04-06 DIAGNOSIS — Z20828 Contact with and (suspected) exposure to other viral communicable diseases: Secondary | ICD-10-CM | POA: Diagnosis not present

## 2019-04-13 DIAGNOSIS — Z20828 Contact with and (suspected) exposure to other viral communicable diseases: Secondary | ICD-10-CM | POA: Diagnosis not present

## 2019-04-18 DIAGNOSIS — F322 Major depressive disorder, single episode, severe without psychotic features: Secondary | ICD-10-CM | POA: Diagnosis not present

## 2019-04-18 DIAGNOSIS — F0151 Vascular dementia with behavioral disturbance: Secondary | ICD-10-CM | POA: Diagnosis not present

## 2019-04-18 DIAGNOSIS — I10 Essential (primary) hypertension: Secondary | ICD-10-CM | POA: Diagnosis not present

## 2019-04-18 DIAGNOSIS — I5030 Unspecified diastolic (congestive) heart failure: Secondary | ICD-10-CM | POA: Diagnosis not present

## 2019-04-20 DIAGNOSIS — Z20828 Contact with and (suspected) exposure to other viral communicable diseases: Secondary | ICD-10-CM | POA: Diagnosis not present

## 2019-04-27 DIAGNOSIS — Z20828 Contact with and (suspected) exposure to other viral communicable diseases: Secondary | ICD-10-CM | POA: Diagnosis not present

## 2019-05-10 ENCOUNTER — Encounter: Payer: Self-pay | Admitting: Family Medicine

## 2019-05-10 NOTE — Telephone Encounter (Signed)
[facility] removed as PCP based on My Chart message. Nothing further needed at this time.

## 2019-05-13 ENCOUNTER — Ambulatory Visit: Payer: Self-pay | Admitting: Neurology

## 2019-05-14 DIAGNOSIS — F0151 Vascular dementia with behavioral disturbance: Secondary | ICD-10-CM | POA: Diagnosis not present

## 2019-05-14 DIAGNOSIS — I5030 Unspecified diastolic (congestive) heart failure: Secondary | ICD-10-CM | POA: Diagnosis not present

## 2019-05-14 DIAGNOSIS — I10 Essential (primary) hypertension: Secondary | ICD-10-CM | POA: Diagnosis not present

## 2019-05-14 DIAGNOSIS — F322 Major depressive disorder, single episode, severe without psychotic features: Secondary | ICD-10-CM | POA: Diagnosis not present

## 2019-06-02 DIAGNOSIS — I6389 Other cerebral infarction: Secondary | ICD-10-CM | POA: Diagnosis not present

## 2019-06-02 DIAGNOSIS — J84112 Idiopathic pulmonary fibrosis: Secondary | ICD-10-CM | POA: Diagnosis not present

## 2019-06-02 DIAGNOSIS — U071 COVID-19: Secondary | ICD-10-CM | POA: Diagnosis not present

## 2019-06-02 DIAGNOSIS — I5032 Chronic diastolic (congestive) heart failure: Secondary | ICD-10-CM | POA: Diagnosis not present

## 2019-06-04 DIAGNOSIS — F322 Major depressive disorder, single episode, severe without psychotic features: Secondary | ICD-10-CM | POA: Diagnosis not present

## 2019-06-04 DIAGNOSIS — I5030 Unspecified diastolic (congestive) heart failure: Secondary | ICD-10-CM | POA: Diagnosis not present

## 2019-06-04 DIAGNOSIS — I10 Essential (primary) hypertension: Secondary | ICD-10-CM | POA: Diagnosis not present

## 2019-06-04 DIAGNOSIS — F0151 Vascular dementia with behavioral disturbance: Secondary | ICD-10-CM | POA: Diagnosis not present

## 2019-06-15 DIAGNOSIS — I5032 Chronic diastolic (congestive) heart failure: Secondary | ICD-10-CM | POA: Diagnosis not present

## 2019-06-15 DIAGNOSIS — U071 COVID-19: Secondary | ICD-10-CM | POA: Diagnosis not present

## 2019-06-15 DIAGNOSIS — J84112 Idiopathic pulmonary fibrosis: Secondary | ICD-10-CM | POA: Diagnosis not present

## 2019-06-15 DIAGNOSIS — I6389 Other cerebral infarction: Secondary | ICD-10-CM | POA: Diagnosis not present

## 2019-06-22 DIAGNOSIS — I5032 Chronic diastolic (congestive) heart failure: Secondary | ICD-10-CM | POA: Diagnosis not present

## 2019-06-22 DIAGNOSIS — U071 COVID-19: Secondary | ICD-10-CM | POA: Diagnosis not present

## 2019-06-22 DIAGNOSIS — I6389 Other cerebral infarction: Secondary | ICD-10-CM | POA: Diagnosis not present

## 2019-06-22 DIAGNOSIS — J84112 Idiopathic pulmonary fibrosis: Secondary | ICD-10-CM | POA: Diagnosis not present

## 2019-07-16 DIAGNOSIS — I10 Essential (primary) hypertension: Secondary | ICD-10-CM | POA: Diagnosis not present

## 2019-07-16 DIAGNOSIS — F0151 Vascular dementia with behavioral disturbance: Secondary | ICD-10-CM | POA: Diagnosis not present

## 2019-07-16 DIAGNOSIS — I5030 Unspecified diastolic (congestive) heart failure: Secondary | ICD-10-CM | POA: Diagnosis not present

## 2019-07-16 DIAGNOSIS — F322 Major depressive disorder, single episode, severe without psychotic features: Secondary | ICD-10-CM | POA: Diagnosis not present

## 2019-07-20 DIAGNOSIS — J84112 Idiopathic pulmonary fibrosis: Secondary | ICD-10-CM | POA: Diagnosis not present

## 2019-07-20 DIAGNOSIS — I5032 Chronic diastolic (congestive) heart failure: Secondary | ICD-10-CM | POA: Diagnosis not present

## 2019-07-20 DIAGNOSIS — I10 Essential (primary) hypertension: Secondary | ICD-10-CM | POA: Diagnosis not present

## 2019-07-20 DIAGNOSIS — I6389 Other cerebral infarction: Secondary | ICD-10-CM | POA: Diagnosis not present

## 2019-07-22 DIAGNOSIS — E559 Vitamin D deficiency, unspecified: Secondary | ICD-10-CM | POA: Diagnosis not present

## 2019-07-22 DIAGNOSIS — D649 Anemia, unspecified: Secondary | ICD-10-CM | POA: Diagnosis not present

## 2019-07-22 DIAGNOSIS — E785 Hyperlipidemia, unspecified: Secondary | ICD-10-CM | POA: Diagnosis not present

## 2019-07-22 DIAGNOSIS — Z79899 Other long term (current) drug therapy: Secondary | ICD-10-CM | POA: Diagnosis not present

## 2019-08-13 DIAGNOSIS — I10 Essential (primary) hypertension: Secondary | ICD-10-CM | POA: Diagnosis not present

## 2019-08-13 DIAGNOSIS — F322 Major depressive disorder, single episode, severe without psychotic features: Secondary | ICD-10-CM | POA: Diagnosis not present

## 2019-08-13 DIAGNOSIS — I5030 Unspecified diastolic (congestive) heart failure: Secondary | ICD-10-CM | POA: Diagnosis not present

## 2019-08-13 DIAGNOSIS — F0151 Vascular dementia with behavioral disturbance: Secondary | ICD-10-CM | POA: Diagnosis not present

## 2019-09-10 DIAGNOSIS — Z20828 Contact with and (suspected) exposure to other viral communicable diseases: Secondary | ICD-10-CM | POA: Diagnosis not present

## 2019-09-17 DIAGNOSIS — F322 Major depressive disorder, single episode, severe without psychotic features: Secondary | ICD-10-CM | POA: Diagnosis not present

## 2019-09-17 DIAGNOSIS — I10 Essential (primary) hypertension: Secondary | ICD-10-CM | POA: Diagnosis not present

## 2019-09-17 DIAGNOSIS — I5030 Unspecified diastolic (congestive) heart failure: Secondary | ICD-10-CM | POA: Diagnosis not present

## 2019-09-17 DIAGNOSIS — F0151 Vascular dementia with behavioral disturbance: Secondary | ICD-10-CM | POA: Diagnosis not present

## 2019-09-21 DIAGNOSIS — Z20828 Contact with and (suspected) exposure to other viral communicable diseases: Secondary | ICD-10-CM | POA: Diagnosis not present

## 2019-09-26 DIAGNOSIS — Z03818 Encounter for observation for suspected exposure to other biological agents ruled out: Secondary | ICD-10-CM | POA: Diagnosis not present

## 2019-10-15 DIAGNOSIS — I5030 Unspecified diastolic (congestive) heart failure: Secondary | ICD-10-CM | POA: Diagnosis not present

## 2019-10-15 DIAGNOSIS — F322 Major depressive disorder, single episode, severe without psychotic features: Secondary | ICD-10-CM | POA: Diagnosis not present

## 2019-10-15 DIAGNOSIS — F0151 Vascular dementia with behavioral disturbance: Secondary | ICD-10-CM | POA: Diagnosis not present

## 2019-10-15 DIAGNOSIS — I10 Essential (primary) hypertension: Secondary | ICD-10-CM | POA: Diagnosis not present

## 2019-10-30 DIAGNOSIS — Z03818 Encounter for observation for suspected exposure to other biological agents ruled out: Secondary | ICD-10-CM | POA: Diagnosis not present

## 2019-11-03 DIAGNOSIS — Z20828 Contact with and (suspected) exposure to other viral communicable diseases: Secondary | ICD-10-CM | POA: Diagnosis not present

## 2019-11-19 DIAGNOSIS — I10 Essential (primary) hypertension: Secondary | ICD-10-CM | POA: Diagnosis not present

## 2019-11-19 DIAGNOSIS — F322 Major depressive disorder, single episode, severe without psychotic features: Secondary | ICD-10-CM | POA: Diagnosis not present

## 2019-11-19 DIAGNOSIS — I5030 Unspecified diastolic (congestive) heart failure: Secondary | ICD-10-CM | POA: Diagnosis not present

## 2019-11-19 DIAGNOSIS — F0151 Vascular dementia with behavioral disturbance: Secondary | ICD-10-CM | POA: Diagnosis not present

## 2019-12-10 DIAGNOSIS — F0151 Vascular dementia with behavioral disturbance: Secondary | ICD-10-CM | POA: Diagnosis not present

## 2019-12-10 DIAGNOSIS — I10 Essential (primary) hypertension: Secondary | ICD-10-CM | POA: Diagnosis not present

## 2019-12-10 DIAGNOSIS — I5030 Unspecified diastolic (congestive) heart failure: Secondary | ICD-10-CM | POA: Diagnosis not present

## 2019-12-10 DIAGNOSIS — F322 Major depressive disorder, single episode, severe without psychotic features: Secondary | ICD-10-CM | POA: Diagnosis not present

## 2019-12-22 DIAGNOSIS — Z03818 Encounter for observation for suspected exposure to other biological agents ruled out: Secondary | ICD-10-CM | POA: Diagnosis not present

## 2019-12-27 DIAGNOSIS — Z03818 Encounter for observation for suspected exposure to other biological agents ruled out: Secondary | ICD-10-CM | POA: Diagnosis not present

## 2019-12-29 DIAGNOSIS — R05 Cough: Secondary | ICD-10-CM | POA: Diagnosis not present

## 2019-12-30 DIAGNOSIS — Z20822 Contact with and (suspected) exposure to covid-19: Secondary | ICD-10-CM | POA: Diagnosis not present

## 2020-01-03 DIAGNOSIS — Z20822 Contact with and (suspected) exposure to covid-19: Secondary | ICD-10-CM | POA: Diagnosis not present

## 2020-01-06 DIAGNOSIS — Z20822 Contact with and (suspected) exposure to covid-19: Secondary | ICD-10-CM | POA: Diagnosis not present

## 2020-01-10 DIAGNOSIS — Z20822 Contact with and (suspected) exposure to covid-19: Secondary | ICD-10-CM | POA: Diagnosis not present

## 2020-01-18 DIAGNOSIS — Z20828 Contact with and (suspected) exposure to other viral communicable diseases: Secondary | ICD-10-CM | POA: Diagnosis not present

## 2020-01-21 DIAGNOSIS — Z20828 Contact with and (suspected) exposure to other viral communicable diseases: Secondary | ICD-10-CM | POA: Diagnosis not present

## 2020-02-01 DIAGNOSIS — Z20828 Contact with and (suspected) exposure to other viral communicable diseases: Secondary | ICD-10-CM | POA: Diagnosis not present

## 2020-02-07 DIAGNOSIS — Z20828 Contact with and (suspected) exposure to other viral communicable diseases: Secondary | ICD-10-CM | POA: Diagnosis not present

## 2020-02-10 DIAGNOSIS — Z20828 Contact with and (suspected) exposure to other viral communicable diseases: Secondary | ICD-10-CM | POA: Diagnosis not present

## 2020-02-14 DIAGNOSIS — Z20828 Contact with and (suspected) exposure to other viral communicable diseases: Secondary | ICD-10-CM | POA: Diagnosis not present

## 2020-02-17 DIAGNOSIS — Z20828 Contact with and (suspected) exposure to other viral communicable diseases: Secondary | ICD-10-CM | POA: Diagnosis not present

## 2020-02-20 DIAGNOSIS — Z20828 Contact with and (suspected) exposure to other viral communicable diseases: Secondary | ICD-10-CM | POA: Diagnosis not present

## 2020-02-23 DIAGNOSIS — Z20828 Contact with and (suspected) exposure to other viral communicable diseases: Secondary | ICD-10-CM | POA: Diagnosis not present

## 2020-02-27 DIAGNOSIS — Z20828 Contact with and (suspected) exposure to other viral communicable diseases: Secondary | ICD-10-CM | POA: Diagnosis not present

## 2020-03-06 DIAGNOSIS — Z20828 Contact with and (suspected) exposure to other viral communicable diseases: Secondary | ICD-10-CM | POA: Diagnosis not present

## 2020-03-09 DIAGNOSIS — Z20828 Contact with and (suspected) exposure to other viral communicable diseases: Secondary | ICD-10-CM | POA: Diagnosis not present

## 2020-03-13 DIAGNOSIS — Z20828 Contact with and (suspected) exposure to other viral communicable diseases: Secondary | ICD-10-CM | POA: Diagnosis not present

## 2020-03-16 DIAGNOSIS — Z20828 Contact with and (suspected) exposure to other viral communicable diseases: Secondary | ICD-10-CM | POA: Diagnosis not present

## 2020-03-20 DIAGNOSIS — Z20828 Contact with and (suspected) exposure to other viral communicable diseases: Secondary | ICD-10-CM | POA: Diagnosis not present

## 2020-03-23 DIAGNOSIS — Z20828 Contact with and (suspected) exposure to other viral communicable diseases: Secondary | ICD-10-CM | POA: Diagnosis not present

## 2020-03-26 DIAGNOSIS — Z20828 Contact with and (suspected) exposure to other viral communicable diseases: Secondary | ICD-10-CM | POA: Diagnosis not present

## 2020-05-02 DIAGNOSIS — Z20822 Contact with and (suspected) exposure to covid-19: Secondary | ICD-10-CM | POA: Diagnosis not present

## 2020-05-04 DIAGNOSIS — Z20822 Contact with and (suspected) exposure to covid-19: Secondary | ICD-10-CM | POA: Diagnosis not present

## 2020-07-04 DIAGNOSIS — Z20822 Contact with and (suspected) exposure to covid-19: Secondary | ICD-10-CM | POA: Diagnosis not present

## 2020-09-14 ENCOUNTER — Emergency Department (HOSPITAL_COMMUNITY)

## 2020-09-14 ENCOUNTER — Other Ambulatory Visit: Payer: Self-pay

## 2020-09-14 ENCOUNTER — Encounter (HOSPITAL_COMMUNITY): Payer: Self-pay

## 2020-09-14 ENCOUNTER — Emergency Department (HOSPITAL_COMMUNITY)
Admission: EM | Admit: 2020-09-14 | Discharge: 2020-09-15 | Disposition: A | Discharge status: Home / Self Care | Attending: Emergency Medicine | Admitting: Emergency Medicine

## 2020-09-14 DIAGNOSIS — S0181XA Laceration without foreign body of other part of head, initial encounter: Secondary | ICD-10-CM

## 2020-09-14 DIAGNOSIS — S0990XA Unspecified injury of head, initial encounter: Secondary | ICD-10-CM

## 2020-09-14 DIAGNOSIS — W19XXXA Unspecified fall, initial encounter: Secondary | ICD-10-CM

## 2020-09-14 DIAGNOSIS — R918 Other nonspecific abnormal finding of lung field: Secondary | ICD-10-CM | POA: Diagnosis not present

## 2020-09-14 DIAGNOSIS — M542 Cervicalgia: Secondary | ICD-10-CM | POA: Diagnosis not present

## 2020-09-14 DIAGNOSIS — I509 Heart failure, unspecified: Secondary | ICD-10-CM | POA: Diagnosis not present

## 2020-09-14 DIAGNOSIS — Z23 Encounter for immunization: Secondary | ICD-10-CM | POA: Diagnosis not present

## 2020-09-14 DIAGNOSIS — R519 Headache, unspecified: Secondary | ICD-10-CM | POA: Diagnosis not present

## 2020-09-14 DIAGNOSIS — Z91040 Latex allergy status: Secondary | ICD-10-CM | POA: Insufficient documentation

## 2020-09-14 DIAGNOSIS — I110 Hypertensive heart disease with heart failure: Secondary | ICD-10-CM | POA: Diagnosis not present

## 2020-09-14 DIAGNOSIS — S0101XA Laceration without foreign body of scalp, initial encounter: Secondary | ICD-10-CM | POA: Diagnosis not present

## 2020-09-14 DIAGNOSIS — Z043 Encounter for examination and observation following other accident: Secondary | ICD-10-CM | POA: Diagnosis not present

## 2020-09-14 HISTORY — DX: Heart failure, unspecified: I50.9

## 2020-09-14 HISTORY — DX: Essential (primary) hypertension: I10

## 2020-09-14 LAB — BASIC METABOLIC PANEL
Anion gap: 9 (ref 5–15)
BUN: 10 mg/dL (ref 8–23)
CO2: 26 mmol/L (ref 22–32)
Calcium: 9.2 mg/dL (ref 8.9–10.3)
Chloride: 100 mmol/L (ref 98–111)
Creatinine, Ser: 0.62 mg/dL (ref 0.44–1.00)
GFR, Estimated: >60 mL/min (ref >60)
Glucose, Bld: 109 mg/dL — ABNORMAL HIGH (ref 70–99)
Potassium: 3.8 mmol/L (ref 3.5–5.1)
Sodium: 135 mmol/L (ref 135–145)

## 2020-09-14 LAB — CBC WITH DIFFERENTIAL/PLATELET
Abs Immature Granulocytes: 0.03 10*3/uL (ref 0.00–0.07)
Basophils Absolute: 0.1 10*3/uL (ref 0.0–0.1)
Basophils Relative: 1 %
Eosinophils Absolute: 0.4 10*3/uL (ref 0.0–0.5)
Eosinophils Relative: 5 %
HCT: 43.8 % (ref 36.0–46.0)
Hemoglobin: 14 g/dL (ref 12.0–15.0)
Immature Granulocytes: 0 %
Lymphocytes Relative: 16 %
Lymphs Abs: 1.2 10*3/uL (ref 0.7–4.0)
MCH: 28.2 pg (ref 26.0–34.0)
MCHC: 32 g/dL (ref 30.0–36.0)
MCV: 88.3 fL (ref 80.0–100.0)
Monocytes Absolute: 0.6 10*3/uL (ref 0.1–1.0)
Monocytes Relative: 8 %
Neutro Abs: 5.1 10*3/uL (ref 1.7–7.7)
Neutrophils Relative %: 70 %
Platelets: 160 10*3/uL (ref 150–400)
RBC: 4.96 MIL/uL (ref 3.87–5.11)
RDW: 15.4 % (ref 11.5–15.5)
WBC: 7.3 10*3/uL (ref 4.0–10.5)
nRBC: 0 % (ref 0.0–0.2)

## 2020-09-14 MED ORDER — TETANUS-DIPHTH-ACELL PERTUSSIS 5-2.5-18.5 LF-MCG/0.5 IM SUSY
0.5000 mL | PREFILLED_SYRINGE | Freq: Once | INTRAMUSCULAR | Status: AC
  Administered 2020-09-14: 0.5 mL via INTRAMUSCULAR
  Filled 2020-09-14: qty 0.5

## 2020-09-14 MED ORDER — LIDOCAINE-EPINEPHRINE (PF) 2 %-1:200000 IJ SOLN
10.0000 mL | Freq: Once | INTRAMUSCULAR | Status: AC
  Administered 2020-09-14: 10 mL
  Filled 2020-09-14: qty 20

## 2020-09-14 NOTE — Progress Notes (Signed)
Orthopedic Tech Progress Note Patient Details:  Carmen Duncan 1940/06/25 [id number]  Patient ID: [name], female   DOB: 1940-08-02, 80 y.o.   MRN: [id number]   [name] 09/14/2020, 9:53 PM L2 Trauma

## 2020-09-14 NOTE — ED Provider Notes (Signed)
[facility] EMERGENCY DEPARTMENT Provider Note   CSN: [id number] Arrival date & time: 09/14/20  2052     History Chief Complaint  Patient presents with  . Level 2  . Fall    Carmen Duncan is a 80 y.o. female.  80 year old female with prior medical history as detailed below presents for evaluation after fall.  Patient was found on the floor of her room at [facility].  Patient with minor laceration to the anterior forehead.  Patient unable to provide significant history given her pre-existing dementia.  Level 5 caveat secondary to same.  Patient without other obvious injury.  The history is provided by the patient, medical records and the EMS personnel.  Fall This is a new problem. The current episode started less than 1 hour ago. The problem occurs rarely. The problem has not changed since onset.Nothing aggravates the symptoms. Nothing relieves the symptoms.       Past Medical History:  Diagnosis Date  . CHF (congestive heart failure) ([place])   . Hypertension     There are no problems to display for this patient.     OB History   No obstetric history on file.     No family history on file.     Home Medications Prior to Admission medications   Not on File    Allergies    Latex, Sulfa antibiotics, and Tape  Review of Systems   Review of Systems  Unable to perform ROS: Acuity of condition    Physical Exam Updated Vital Signs BP (!) 159/70   Pulse (!) 38   Temp 98.3 F (36.8 C) (Temporal)   Resp (!) 25   Ht 5\' 2"  (1.575 m)   Wt 40.8 kg   SpO2 95%   BMI 16.46 kg/m   Physical Exam Vitals and nursing note reviewed.  Constitutional:      General: She is not in acute distress.    Appearance: She is well-developed.  HENT:     Head: Normocephalic.     Comments: Small 0.5 centimeter laceration to the anterior forehead.  No active bleeding Eyes:     Conjunctiva/sclera: Conjunctivae normal.     Pupils: Pupils are equal, round,  and reactive to light.  Cardiovascular:     Rate and Rhythm: Normal rate and regular rhythm.     Heart sounds: Normal heart sounds.  Pulmonary:     Effort: Pulmonary effort is normal. No respiratory distress.     Breath sounds: Normal breath sounds.  Abdominal:     General: There is no distension.     Palpations: Abdomen is soft.     Tenderness: There is no abdominal tenderness.  Musculoskeletal:        General: No deformity. Normal range of motion.     Cervical back: Normal range of motion and neck supple.  Skin:    General: Skin is warm and dry.  Neurological:     Mental Status: She is alert and oriented to person, place, and time.     ED Results / Procedures / Treatments   Labs (all labs ordered are listed, but only abnormal results are displayed) Labs Reviewed  BASIC METABOLIC PANEL - Abnormal; Notable for the following components:      Result Value   Glucose, Bld 109 (*)    All other components within normal limits  CBC WITH DIFFERENTIAL/PLATELET    EKG None  Radiology DG Pelvis 1-2 Views  Result Date: 09/14/2020 CLINICAL DATA:  Fall  EXAM: PELVIS - 1-2 VIEW COMPARISON:  None. FINDINGS: Lucency in the left superior pubic ramus. I suspect this is overlapping bowel gas. Hip joints are maintained. SI joints symmetric. No proximal femoral fracture, subluxation or dislocation. IMPRESSION: Lucency projects over the left superior pubic ramus, favor overlapping bowel gas. This could be confirmed with dedicated left hip series if felt clinically indicated. Electronically Signed   By: Rolm Baptise M.D.   On: 09/14/2020 21:25   CT Head Wo Contrast  Result Date: 09/14/2020 CLINICAL DATA:  Recent fall with headaches and neck pain, initial encounter EXAM: CT HEAD WITHOUT CONTRAST CT CERVICAL SPINE WITHOUT CONTRAST TECHNIQUE: Multidetector CT imaging of the head and cervical spine was performed following the standard protocol without intravenous contrast. Multiplanar CT image  reconstructions of the cervical spine were also generated. COMPARISON:  12/27/2018 FINDINGS: CT HEAD FINDINGS Brain: No evidence of acute infarction, hemorrhage, hydrocephalus, extra-axial collection or mass lesion/mass effect. Chronic atrophic and ischemic changes are noted. Vascular: No hyperdense vessel or unexpected calcification. Skull: Normal. Negative for fracture or focal lesion. Sinuses/Orbits: No acute finding. Other: Mild scalp hematoma is noted in the left frontal region. CT CERVICAL SPINE FINDINGS Alignment: Within normal limits. Skull base and vertebrae: 7 cervical segments are well visualized. Vertebral body height is well maintained. Multilevel facet hypertrophic changes and mild osteophytic changes are seen. No acute fracture or acute facet abnormality is noted. The odontoid is within normal limits. Soft tissues and spinal canal: Surrounding soft tissue structures show vascular calcification. No soft tissue abnormality is noted. Upper chest: Visualized lung apices demonstrate some progressive pleural and parenchymal scarring. Other: None IMPRESSION: CT of the head: Chronic atrophic and ischemic changes. Mild left scalp hematoma in the frontal region. CT of the cervical spine: Multilevel degenerative change without acute abnormality. Electronically Signed   By: Inez Catalina M.D.   On: 09/14/2020 21:54   CT Cervical Spine Wo Contrast  Result Date: 09/14/2020 CLINICAL DATA:  Recent fall with headaches and neck pain, initial encounter EXAM: CT HEAD WITHOUT CONTRAST CT CERVICAL SPINE WITHOUT CONTRAST TECHNIQUE: Multidetector CT imaging of the head and cervical spine was performed following the standard protocol without intravenous contrast. Multiplanar CT image reconstructions of the cervical spine were also generated. COMPARISON:  12/27/2018 FINDINGS: CT HEAD FINDINGS Brain: No evidence of acute infarction, hemorrhage, hydrocephalus, extra-axial collection or mass lesion/mass effect. Chronic  atrophic and ischemic changes are noted. Vascular: No hyperdense vessel or unexpected calcification. Skull: Normal. Negative for fracture or focal lesion. Sinuses/Orbits: No acute finding. Other: Mild scalp hematoma is noted in the left frontal region. CT CERVICAL SPINE FINDINGS Alignment: Within normal limits. Skull base and vertebrae: 7 cervical segments are well visualized. Vertebral body height is well maintained. Multilevel facet hypertrophic changes and mild osteophytic changes are seen. No acute fracture or acute facet abnormality is noted. The odontoid is within normal limits. Soft tissues and spinal canal: Surrounding soft tissue structures show vascular calcification. No soft tissue abnormality is noted. Upper chest: Visualized lung apices demonstrate some progressive pleural and parenchymal scarring. Other: None IMPRESSION: CT of the head: Chronic atrophic and ischemic changes. Mild left scalp hematoma in the frontal region. CT of the cervical spine: Multilevel degenerative change without acute abnormality. Electronically Signed   By: Inez Catalina M.D.   On: 09/14/2020 21:54   DG Chest Port 1 View  Result Date: 09/14/2020 CLINICAL DATA:  Fall EXAM: PORTABLE CHEST 1 VIEW COMPARISON:  08/11/2018 FINDINGS: Bibasilar opacities are similar to prior study, likely  scarring. There is hyperinflation of the lungs compatible with COPD. Heart is normal size. Loop recorder device noted. No effusions or acute bony abnormality. IMPRESSION: COPD/chronic changes.  No active disease. Electronically Signed   By: Rolm Baptise M.D.   On: 09/14/2020 21:23    Procedures .Marland KitchenLaceration Repair  Date/Time: 09/14/2020 10:34 PM Performed by: Valarie Merino, MD Authorized by: Valarie Merino, MD   Consent:    Consent obtained:  Verbal   Consent given by:  Patient   Risks, benefits, and alternatives were discussed: yes     Risks discussed:  Need for additional repair, infection, nerve damage, pain, poor cosmetic  result, poor wound healing, retained foreign body, tendon damage and vascular damage Universal protocol:    Procedure explained and questions answered to patient or proxy's satisfaction: yes     Relevant documents present and verified: yes     Test results available: yes     Imaging studies available: yes     Required blood products, implants, devices, and special equipment available: yes     Site/side marked: yes     Immediately prior to procedure, a time out was called: yes     Patient identity confirmed:  Verbally with patient Anesthesia:    Anesthesia method:  Local infiltration   Local anesthetic:  Lidocaine 2% WITH epi Laceration details:    Location:  Scalp   Scalp location:  Frontal Pre-procedure details:    Preparation:  Patient was prepped and draped in usual sterile fashion and imaging obtained to evaluate for foreign bodies Exploration:    Limited defect created (wound extended): no     Hemostasis achieved with:  Direct pressure Treatment:    Area cleansed with:  Saline and povidone-iodine   Amount of cleaning:  Standard   Irrigation solution:  Sterile saline   Irrigation method:  Syringe   Visualized foreign bodies/material removed: no     Debridement:  None   Undermining:  None Skin repair:    Repair method:  Sutures   Suture size:  5-0   Suture material:  Prolene   Suture technique:  Simple interrupted   Number of sutures:  4 Approximation:    Approximation:  Close Repair type:    Repair type:  Simple Post-procedure details:    Dressing:  Open (no dressing)   Procedure completion:  Tolerated     Medications Ordered in ED Medications  Tdap (BOOSTRIX) injection 0.5 mL (0.5 mLs Intramuscular Given 09/14/20 2210)  lidocaine-EPINEPHrine (XYLOCAINE W/EPI) 2 %-1:200000 (PF) injection 10 mL (10 mLs Infiltration Given by Other 09/14/20 2211)    ED Course  I have reviewed the triage vital signs and the nursing notes.  Pertinent labs & imaging results that were  available during my care of the patient were reviewed by me and considered in my medical decision making (see chart for details).    MDM Rules/Calculators/A&P                          MDM  MSE complete  Carmen Duncan was evaluated in Emergency Department on 09/14/2020 for the symptoms described in the history of present illness. She was evaluated in the context of the global COVID-19 pandemic, which necessitated consideration that the patient might be at risk for infection with the SARS-CoV-2 virus that causes COVID-19. Institutional protocols and algorithms that pertain to the evaluation of patients at risk for COVID-19 are in a state of rapid change based  on information released by regulatory bodies including the CDC and federal and state organizations. These policies and algorithms were followed during the patient's care in the ED.  Patient presented for evaluation following reported fall at her facility.  She had minor head injury which required suturing of the laceration to the forehead.  Imaging obtained was without significant abnormality.  The patient's son was at bedside.  The patient was offered overnight observation.  The son declined same.  He would prefer that the patient be sent back to [facility].  Importance of close follow-up is stressed.  Strict return precautions given and understood.   Final Clinical Impression(s) / ED Diagnoses Final diagnoses:  Fall, initial encounter  Injury of head, initial encounter  Laceration of forehead, initial encounter    Rx / DC Orders ED Discharge Orders    None       Valarie Merino, MD 09/14/20 2242

## 2020-09-14 NOTE — ED Notes (Signed)
PTAR Called 

## 2020-09-14 NOTE — Progress Notes (Signed)
   09/14/20 2034  Clinical Encounter Type  Visited With Patient not available  Visit Type Initial;Trauma  Referral From Nurse  Consult/Referral To Chaplain   Chaplain responded to Level 2 trauma. Pt being treated and no support person present. Chaplain not currently needed. Chaplain remains available.  This note was prepared by Chaplain Resident, Dante Gang, MDiv. Chaplain remains available as needed through the on-call pager: [phone].

## 2020-09-14 NOTE — ED Triage Notes (Signed)
Patient arrives from [place] facility with [facility], s/p unwitnessed fall, unknown LOC, found under roommates metal bed frame, lac x 2 to head, patient is on plavix. Hx of dementia

## 2020-09-14 NOTE — Discharge Instructions (Addendum)
Please return for any problem.  Sutures placed today are absorbable.  There will be no need for return visit for removal of suture.

## 2020-09-15 ENCOUNTER — Encounter: Payer: Self-pay | Admitting: Internal Medicine

## 2021-03-20 DIAGNOSIS — E039 Hypothyroidism, unspecified: Secondary | ICD-10-CM | POA: Diagnosis not present

## 2021-03-20 DIAGNOSIS — I10 Essential (primary) hypertension: Secondary | ICD-10-CM | POA: Diagnosis not present

## 2021-06-27 DEATH — deceased
